# Patient Record
Sex: Male | Born: 1970 | Race: Black or African American | Hispanic: No | Marital: Single
Health system: Southern US, Academic
[De-identification: ages and names within clinical notes are randomized; demographics above are authoritative.]

## PROBLEM LIST (undated history)

## (undated) ENCOUNTER — Telehealth: Attending: Clinical | Primary: Clinical

## (undated) ENCOUNTER — Ambulatory Visit

## (undated) ENCOUNTER — Telehealth

## (undated) ENCOUNTER — Encounter

## (undated) ENCOUNTER — Ambulatory Visit
Attending: Student in an Organized Health Care Education/Training Program | Primary: Student in an Organized Health Care Education/Training Program

## (undated) ENCOUNTER — Ambulatory Visit: Payer: MEDICAID | Attending: Cardiovascular Disease | Primary: Cardiovascular Disease

## (undated) ENCOUNTER — Ambulatory Visit: Payer: MEDICAID

## (undated) ENCOUNTER — Encounter: Attending: Clinical | Primary: Clinical

## (undated) ENCOUNTER — Ambulatory Visit: Payer: MEDICAID | Attending: Foot & Ankle Surgery | Primary: Foot & Ankle Surgery

## (undated) ENCOUNTER — Telehealth: Attending: MS" | Primary: MS"

## (undated) ENCOUNTER — Ambulatory Visit: Attending: Mental Health | Primary: Mental Health

## (undated) ENCOUNTER — Telehealth: Attending: Internal Medicine | Primary: Internal Medicine

## (undated) ENCOUNTER — Telehealth
Attending: Student in an Organized Health Care Education/Training Program | Primary: Student in an Organized Health Care Education/Training Program

## (undated) ENCOUNTER — Telehealth: Attending: Oncology | Primary: Oncology

## (undated) ENCOUNTER — Ambulatory Visit: Attending: Family | Primary: Family

## (undated) ENCOUNTER — Telehealth: Attending: Children | Primary: Children

## (undated) ENCOUNTER — Encounter
Attending: Pharmacist Clinician (PhC)/ Clinical Pharmacy Specialist | Primary: Pharmacist Clinician (PhC)/ Clinical Pharmacy Specialist

## (undated) ENCOUNTER — Telehealth: Payer: MEDICAID | Attending: Children | Primary: Children

## (undated) ENCOUNTER — Ambulatory Visit: Attending: Cardiovascular Disease | Primary: Cardiovascular Disease

## (undated) ENCOUNTER — Encounter: Attending: Oncology | Primary: Oncology

## (undated) ENCOUNTER — Inpatient Hospital Stay

## (undated) ENCOUNTER — Encounter: Attending: General Acute Care Hospital | Primary: General Acute Care Hospital

## (undated) ENCOUNTER — Ambulatory Visit
Payer: MEDICAID | Attending: Student in an Organized Health Care Education/Training Program | Primary: Student in an Organized Health Care Education/Training Program

## (undated) ENCOUNTER — Ambulatory Visit: Attending: Internal Medicine | Primary: Internal Medicine

## (undated) DIAGNOSIS — I10 Essential (primary) hypertension: Secondary | ICD-10-CM

## (undated) DIAGNOSIS — I509 Heart failure, unspecified: Secondary | ICD-10-CM

## (undated) DIAGNOSIS — E119 Type 2 diabetes mellitus without complications: Secondary | ICD-10-CM

## (undated) HISTORY — PX: OTHER SURGICAL HISTORY: SHX169

## (undated) HISTORY — PX: STENT PLACEMENT VASCULAR (ARMC HX): HXRAD1737

---

## 2014-09-21 ENCOUNTER — Emergency Department: Payer: Self-pay | Admitting: Emergency Medicine

## 2014-09-21 LAB — CBC
HCT: 47.9 % (ref 40.0–52.0)
HGB: 15.6 g/dL (ref 13.0–18.0)
MCH: 29.3 pg (ref 26.0–34.0)
MCHC: 32.5 g/dL (ref 32.0–36.0)
MCV: 90 fL (ref 80–100)
PLATELETS: 281 10*3/uL (ref 150–440)
RBC: 5.31 10*6/uL (ref 4.40–5.90)
RDW: 12.6 % (ref 11.5–14.5)
WBC: 7.9 10*3/uL (ref 3.8–10.6)

## 2014-09-21 LAB — COMPREHENSIVE METABOLIC PANEL
ALBUMIN: 3.8 g/dL (ref 3.4–5.0)
ALT: 18 U/L
Alkaline Phosphatase: 112 U/L
Anion Gap: 8 (ref 7–16)
BUN: 5 mg/dL — ABNORMAL LOW (ref 7–18)
Bilirubin,Total: 0.3 mg/dL (ref 0.2–1.0)
Calcium, Total: 9.2 mg/dL (ref 8.5–10.1)
Chloride: 103 mmol/L (ref 98–107)
Co2: 27 mmol/L (ref 21–32)
Creatinine: 0.96 mg/dL (ref 0.60–1.30)
EGFR (Non-African Amer.): >60
GLUCOSE: 260 mg/dL — AB (ref 65–99)
OSMOLALITY: 282 (ref 275–301)
Potassium: 4.1 mmol/L (ref 3.5–5.1)
SGOT(AST): 16 U/L (ref 15–37)
SODIUM: 138 mmol/L (ref 136–145)
Total Protein: 8 g/dL (ref 6.4–8.2)

## 2014-09-21 LAB — LIPASE, BLOOD: Lipase: 62 U/L — ABNORMAL LOW (ref 73–393)

## 2016-06-02 ENCOUNTER — Emergency Department: Payer: Self-pay

## 2016-06-02 ENCOUNTER — Encounter: Payer: Self-pay | Admitting: Occupational Medicine

## 2016-06-02 ENCOUNTER — Inpatient Hospital Stay
Admission: EM | Admit: 2016-06-02 | Discharge: 2016-06-02 | DRG: 639 | Discharge status: Against Medical Advice | Payer: Self-pay | Attending: Internal Medicine | Admitting: Internal Medicine

## 2016-06-02 ENCOUNTER — Other Ambulatory Visit: Payer: Self-pay

## 2016-06-02 DIAGNOSIS — T68XXXA Hypothermia, initial encounter: Secondary | ICD-10-CM

## 2016-06-02 DIAGNOSIS — E162 Hypoglycemia, unspecified: Secondary | ICD-10-CM | POA: Diagnosis present

## 2016-06-02 DIAGNOSIS — R451 Restlessness and agitation: Secondary | ICD-10-CM | POA: Diagnosis present

## 2016-06-02 DIAGNOSIS — R4189 Other symptoms and signs involving cognitive functions and awareness: Secondary | ICD-10-CM

## 2016-06-02 DIAGNOSIS — G40409 Other generalized epilepsy and epileptic syndromes, not intractable, without status epilepticus: Secondary | ICD-10-CM | POA: Diagnosis present

## 2016-06-02 DIAGNOSIS — E11649 Type 2 diabetes mellitus with hypoglycemia without coma: Principal | ICD-10-CM | POA: Diagnosis present

## 2016-06-02 DIAGNOSIS — F17200 Nicotine dependence, unspecified, uncomplicated: Secondary | ICD-10-CM | POA: Diagnosis present

## 2016-06-02 HISTORY — DX: Type 2 diabetes mellitus without complications: E11.9

## 2016-06-02 LAB — CBC WITH DIFFERENTIAL/PLATELET
BASOS ABS: 0 10*3/uL (ref 0–0.1)
Basophils Relative: 0 %
EOS PCT: 2 %
Eosinophils Absolute: 0.2 10*3/uL (ref 0–0.7)
HEMATOCRIT: 43.7 % (ref 40.0–52.0)
HEMOGLOBIN: 14.9 g/dL (ref 13.0–18.0)
LYMPHS ABS: 2.5 10*3/uL (ref 1.0–3.6)
LYMPHS PCT: 22 %
MCH: 31.6 pg (ref 26.0–34.0)
MCHC: 34.1 g/dL (ref 32.0–36.0)
MCV: 92.5 fL (ref 80.0–100.0)
MONO ABS: 1.1 10*3/uL — AB (ref 0.2–1.0)
MONOS PCT: 10 %
Neutro Abs: 7.5 10*3/uL — ABNORMAL HIGH (ref 1.4–6.5)
Neutrophils Relative %: 66 %
Platelets: 284 10*3/uL (ref 150–440)
RBC: 4.72 MIL/uL (ref 4.40–5.90)
RDW: 15.4 % — ABNORMAL HIGH (ref 11.5–14.5)
WBC: 11.4 10*3/uL — ABNORMAL HIGH (ref 3.8–10.6)

## 2016-06-02 LAB — COMPREHENSIVE METABOLIC PANEL
ALBUMIN: 3.9 g/dL (ref 3.5–5.0)
ALK PHOS: 82 U/L (ref 38–126)
ALT: 9 U/L — AB (ref 17–63)
ANION GAP: 8 (ref 5–15)
AST: 23 U/L (ref 15–41)
BILIRUBIN TOTAL: 1.1 mg/dL (ref 0.3–1.2)
BUN: 12 mg/dL (ref 6–20)
CALCIUM: 8.4 mg/dL — AB (ref 8.9–10.3)
CO2: 25 mmol/L (ref 22–32)
Chloride: 110 mmol/L (ref 101–111)
Creatinine, Ser: 1.06 mg/dL (ref 0.61–1.24)
GFR calc Af Amer: >60 mL/min (ref >60)
GFR calc non Af Amer: >60 mL/min (ref >60)
GLUCOSE: 39 mg/dL — AB (ref 65–99)
Potassium: 3.4 mmol/L — ABNORMAL LOW (ref 3.5–5.1)
SODIUM: 143 mmol/L (ref 135–145)
TOTAL PROTEIN: 7.2 g/dL (ref 6.5–8.1)

## 2016-06-02 LAB — GLUCOSE, CAPILLARY
GLUCOSE-CAPILLARY: 107 mg/dL — AB (ref 65–99)
GLUCOSE-CAPILLARY: 31 mg/dL — AB (ref 65–99)
GLUCOSE-CAPILLARY: 102 mg/dL — AB (ref 65–99)
GLUCOSE-CAPILLARY: 136 mg/dL — AB (ref 65–99)
Glucose-Capillary: 31 mg/dL — CL (ref 65–99)
Glucose-Capillary: 72 mg/dL (ref 65–99)
Glucose-Capillary: 176 mg/dL — ABNORMAL HIGH (ref 65–99)

## 2016-06-02 LAB — URINE DRUG SCREEN, QUALITATIVE (ARMC ONLY)
Amphetamines, Ur Screen: NOT DETECTED
BARBITURATES, UR SCREEN: NOT DETECTED
Benzodiazepine, Ur Scrn: POSITIVE — AB
COCAINE METABOLITE, UR ~~LOC~~: POSITIVE — AB
Cannabinoid 50 Ng, Ur ~~LOC~~: POSITIVE — AB
MDMA (Ecstasy)Ur Screen: NOT DETECTED
METHADONE SCREEN, URINE: NOT DETECTED
OPIATE, UR SCREEN: NOT DETECTED
PHENCYCLIDINE (PCP) UR S: NOT DETECTED
Tricyclic, Ur Screen: NOT DETECTED

## 2016-06-02 LAB — HEMOGLOBIN A1C: Hgb A1c MFr Bld: 6.1 % — ABNORMAL HIGH (ref 4.0–6.0)

## 2016-06-02 LAB — ETHANOL: Alcohol, Ethyl (B): <5 mg/dL (ref <5)

## 2016-06-02 LAB — TSH: TSH: 0.849 u[IU]/mL (ref 0.350–4.500)

## 2016-06-02 LAB — CK: CK TOTAL: 147 U/L (ref 49–397)

## 2016-06-02 LAB — TROPONIN I: Troponin I: <0.03 ng/mL (ref <0.031)

## 2016-06-02 IMAGING — CT CT HEAD W/O CM
4 series · 16 of 47 positions shown, 18 images · non-contrast
Comparison: None.

CLINICAL DATA: Unresponsive patient, actively seizing.

EXAM:
CT HEAD WITHOUT CONTRAST
TECHNIQUE: Contiguous axial images were obtained from the base of the skull
through the vertex without intravenous contrast.

[Series 2: head wo · axial · 0.43mm/px · z∈[+728,+844]mm · 7 of 31 slices shown, 9 images]
[im 4/31  brain]
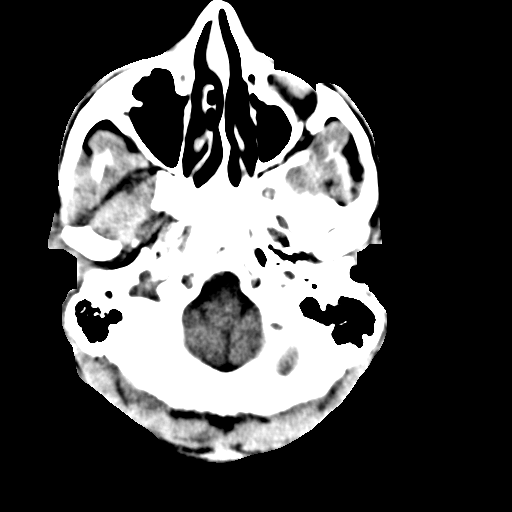
[im 4/31  bone]
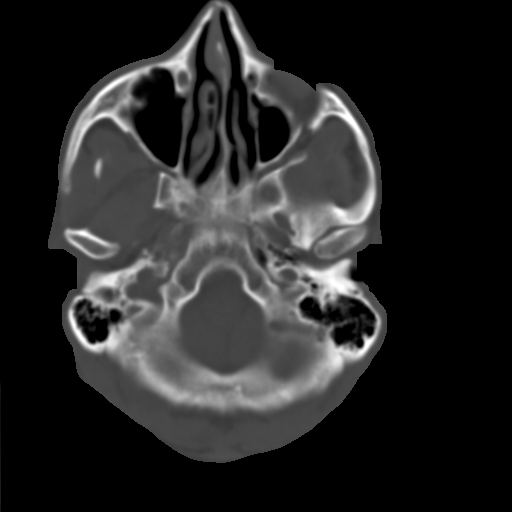
[im 8/31  brain]
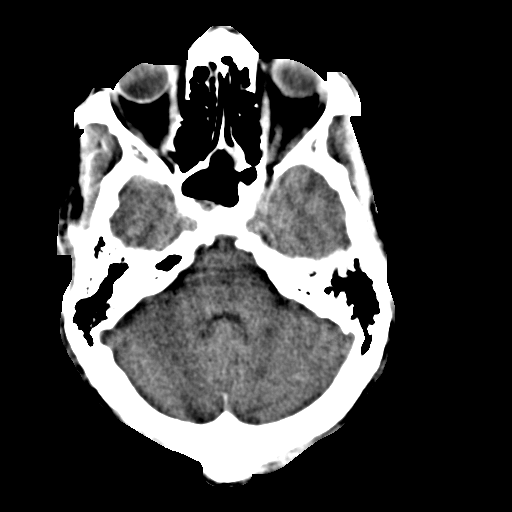
[im 12/31  brain]
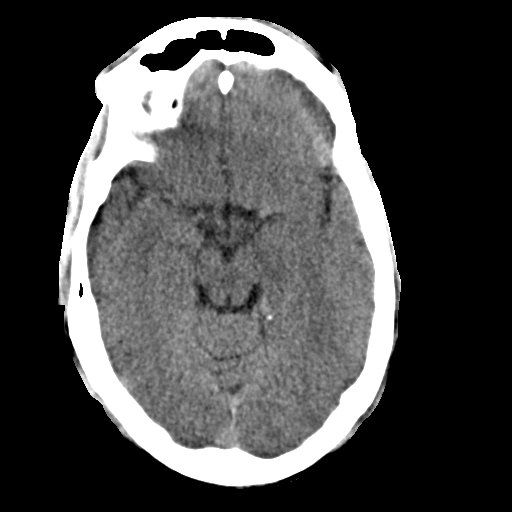
[im 16/31  brain]
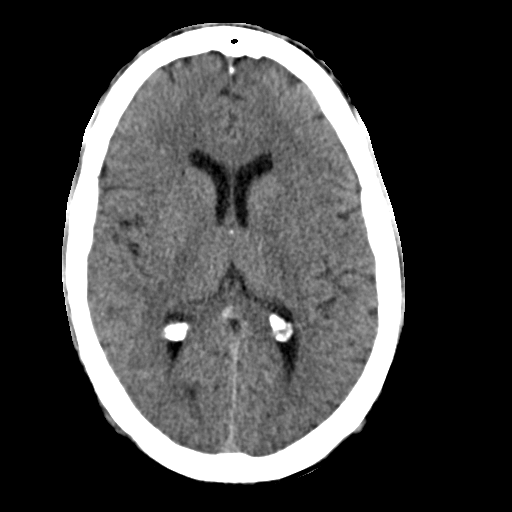
[im 19/31  brain]
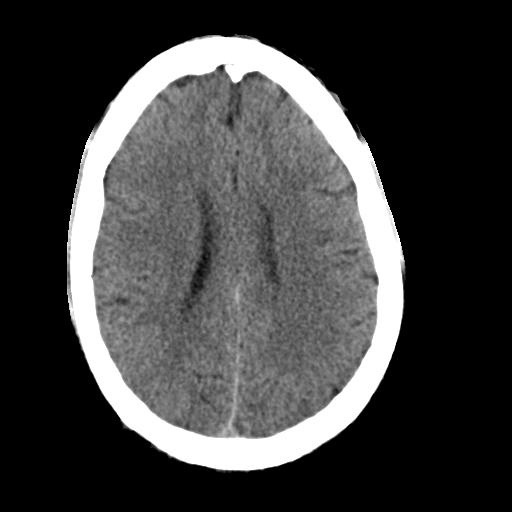
[im 19/31  bone]
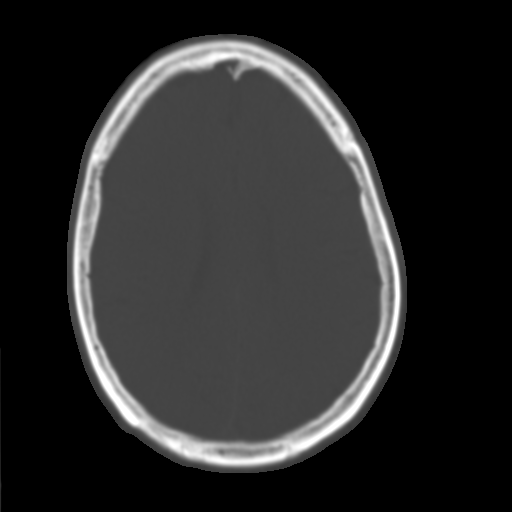
[im 23/31  brain]
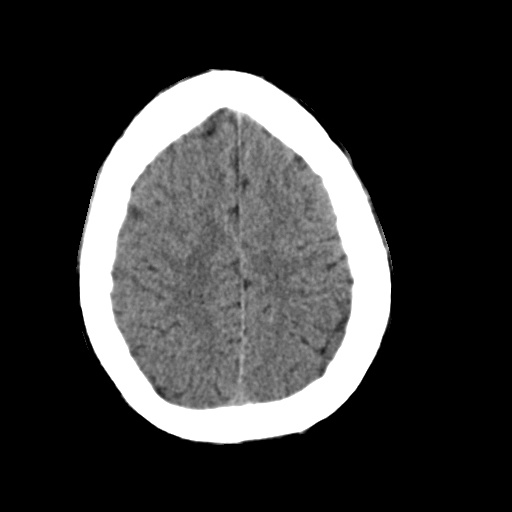
[im 27/31  brain]
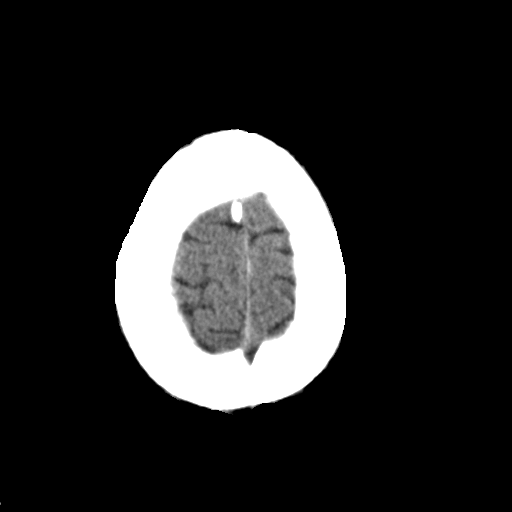

[Series 3: head bone · axial · 0.43mm/px · z∈[+728,+758]mm · 3 of 77 slices shown]
[im 8/77  bone]
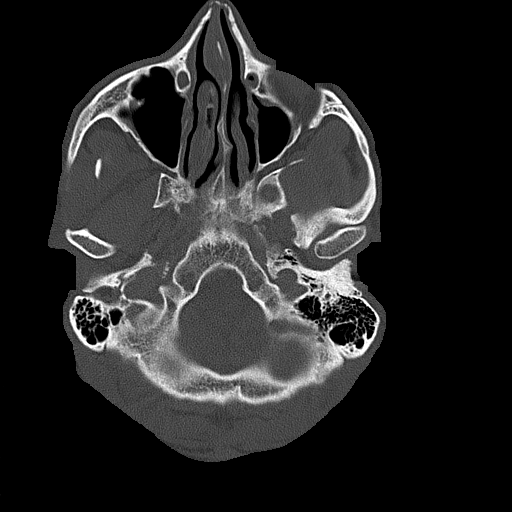
[im 16/77  bone]
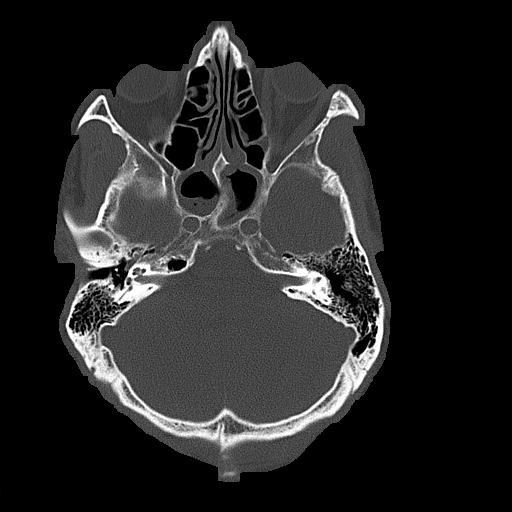
[im 23/77  bone]
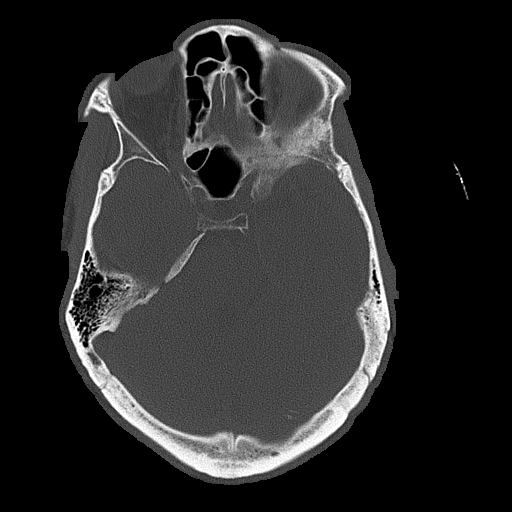

[Series 4: coronal soft tissue · coronal · 0.32mm/px · 3 of 71 slices shown]
[im 24/71  brain]
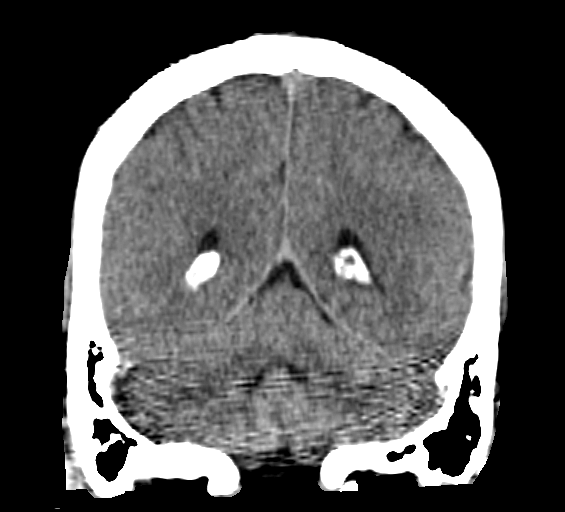
[im 32/71  brain]
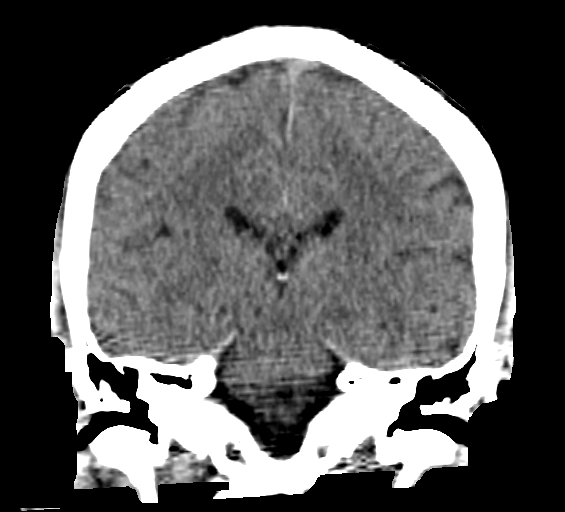
[im 39/71  brain]
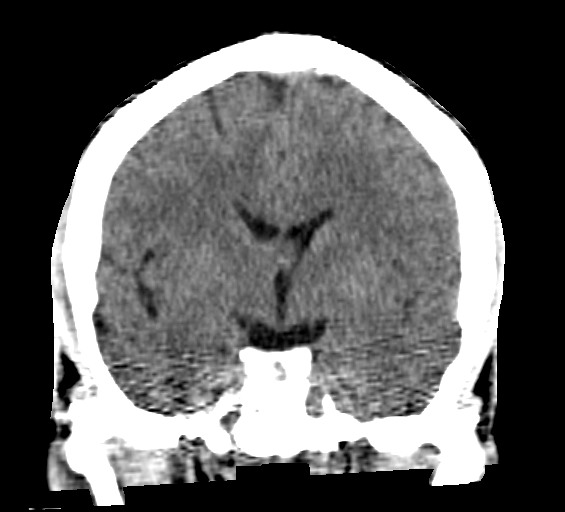

[Series 5: sagittal soft tissue · sagittal · 0.31mm/px · 3 of 52 slices shown]
[im 18/52  brain]
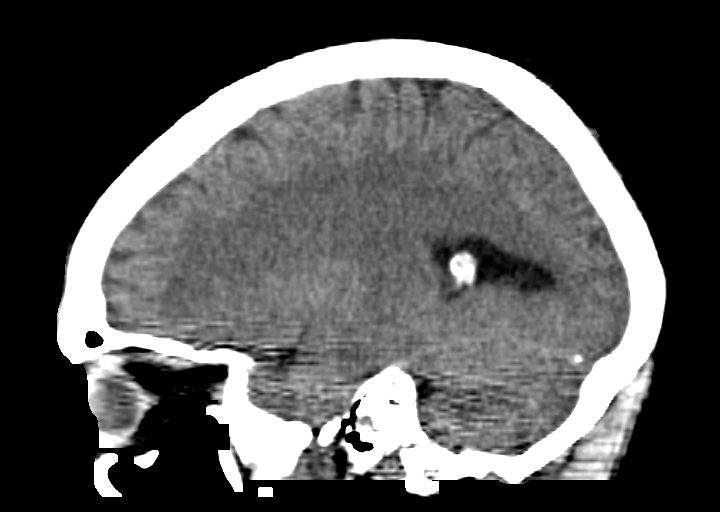
[im 26/52  brain]
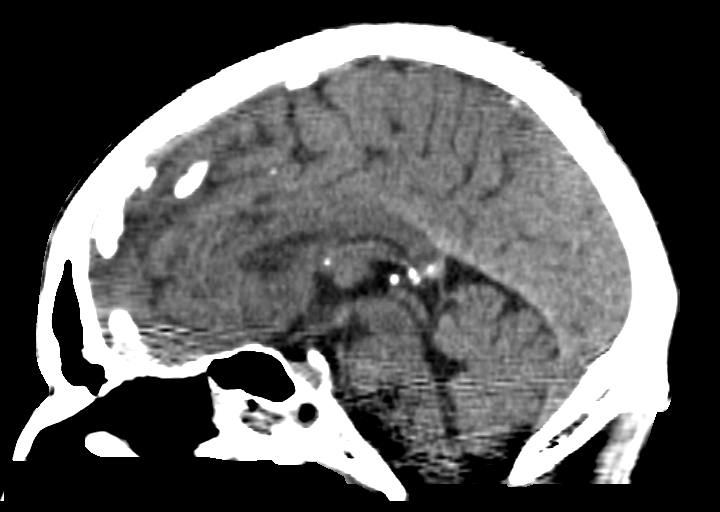
[im 35/52  brain]
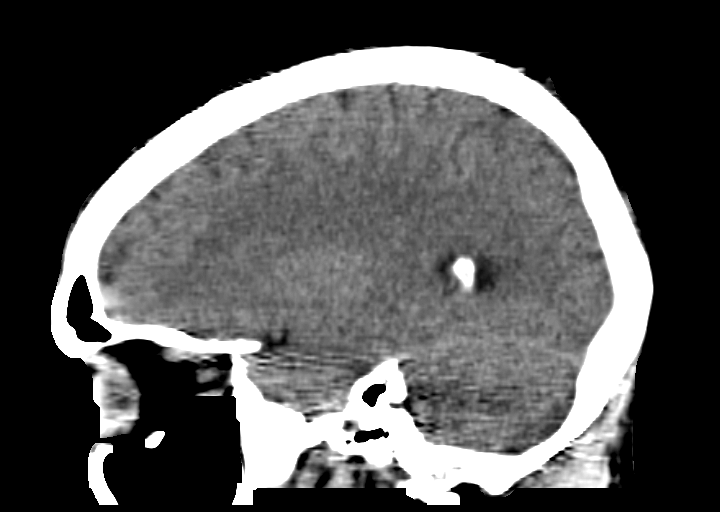

[16 of 47 positions shown; findings below may reference images not displayed]

FINDINGS: Brain: No evidence of acute infarction, hemorrhage, extra-axial
collection, ventriculomegaly, or mass effect.

Vascular: No hyperdense vessel or unexpected calcification.

Skull: Negative for fracture or focal lesion.

Sinuses/Orbits: Polypoid mucosal thickening of bilateral sphenoid
sinuses and less so bilateral ethmoid sinuses.

Other: None.
IMPRESSION: No acute intracranial abnormality.

Bilateral sphenoid and less so ethmoid sinusitis, with chronic
appearance.

## 2016-06-02 IMAGING — CR DG CHEST 1V PORT
1 series · 1 of 1 positions shown · non-contrast
Comparison: None.

CLINICAL DATA: Patient found unresponsive this morning.

EXAM:
PORTABLE CHEST 1 VIEW

[chest ap]
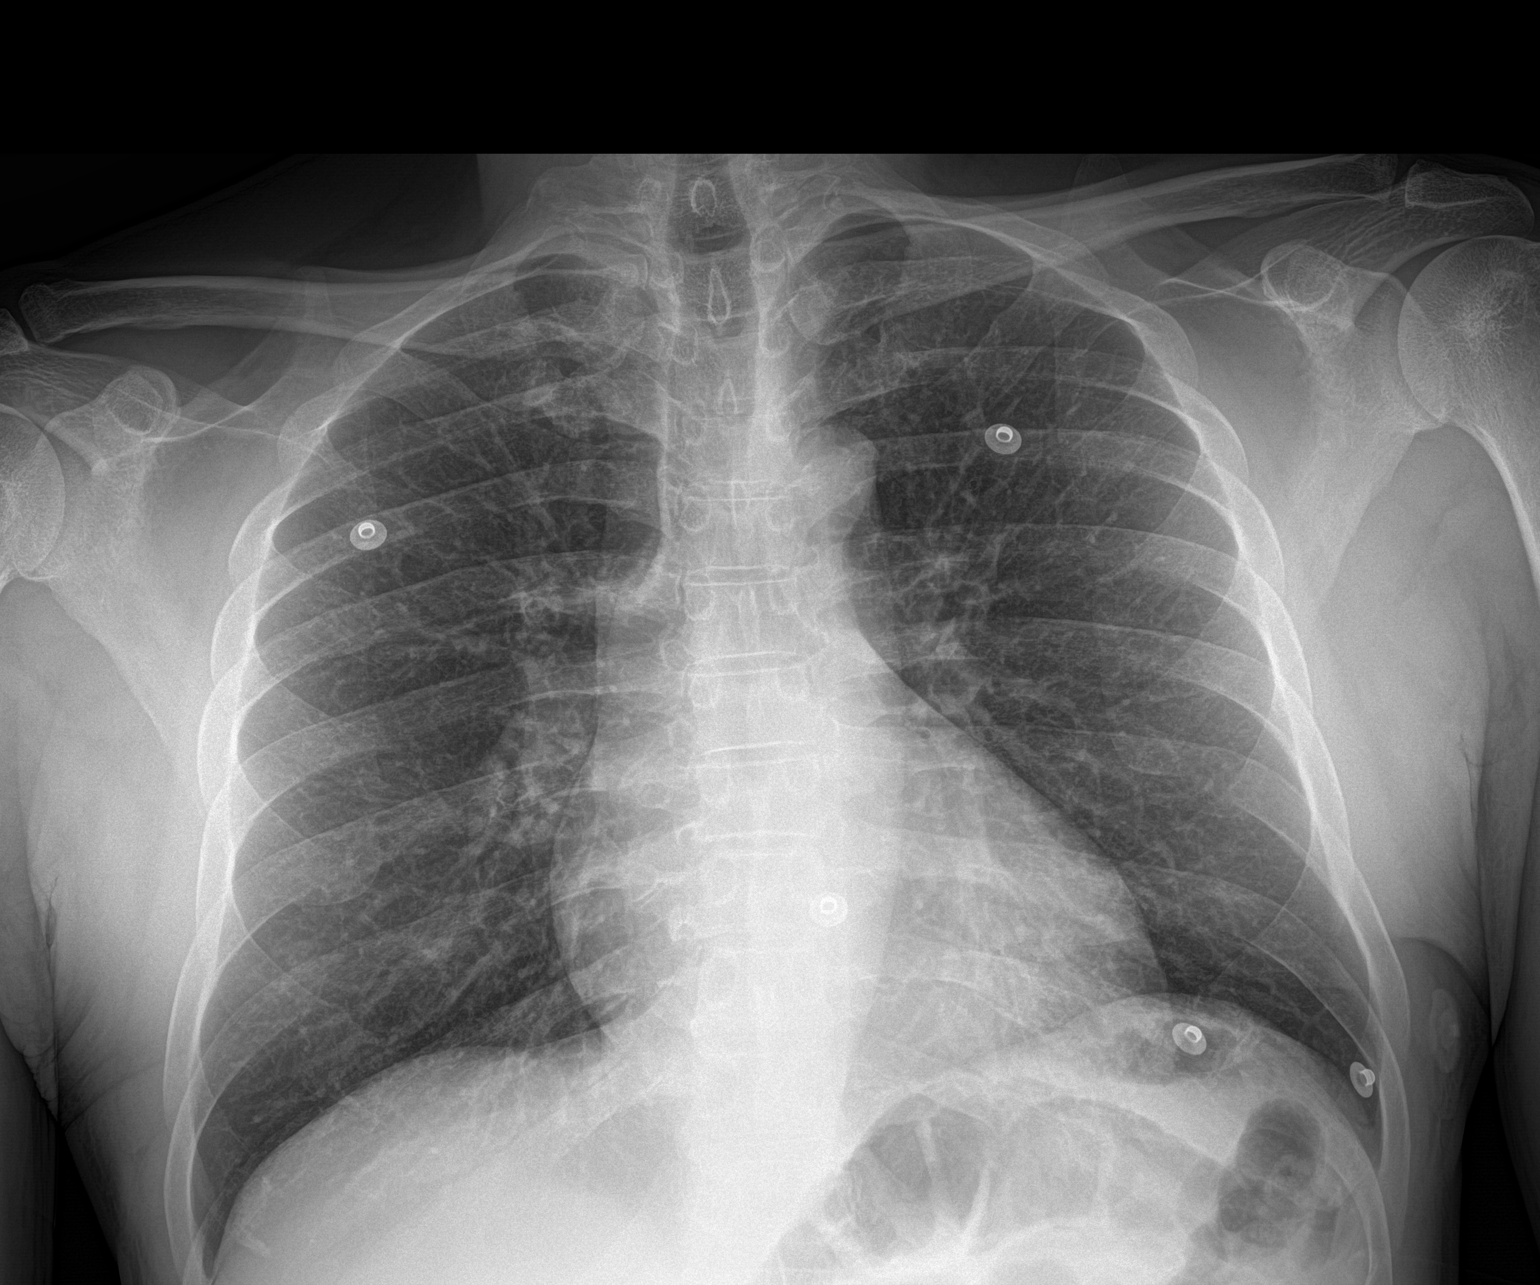

[1 of 1 positions shown; findings below may reference images not displayed]

FINDINGS: Cardiomediastinal silhouette is normal. Mediastinal contours appear
intact.

There is no evidence of focal airspace consolidation, pleural
effusion or pneumothorax.

Osseous structures are without acute abnormality. Soft tissues are
grossly normal.
IMPRESSION: No active disease.

## 2016-06-02 MED ORDER — DEXTROSE 50 % IV SOLN
1.0000 | Freq: Once | INTRAVENOUS | Status: AC
  Administered 2016-06-02: 50 mL via INTRAVENOUS
  Filled 2016-06-02: qty 50

## 2016-06-02 MED ORDER — SODIUM CHLORIDE 0.9 % IV BOLUS (SEPSIS): 1000.0000 mL | Freq: Once | INTRAVENOUS | Status: DC

## 2016-06-02 MED ORDER — ACETAMINOPHEN 650 MG RE SUPP: 650.0000 mg | Freq: Four times a day (QID) | RECTAL | Status: DC | PRN

## 2016-06-02 MED ORDER — ACETAMINOPHEN 325 MG PO TABS: 650.0000 mg | ORAL_TABLET | Freq: Four times a day (QID) | ORAL | Status: DC | PRN

## 2016-06-02 MED ORDER — ONDANSETRON HCL 4 MG PO TABS: 4.0000 mg | ORAL_TABLET | Freq: Four times a day (QID) | ORAL | Status: DC | PRN

## 2016-06-02 MED ORDER — ONDANSETRON HCL 4 MG/2ML IJ SOLN: 4.0000 mg | Freq: Four times a day (QID) | INTRAMUSCULAR | Status: DC | PRN

## 2016-06-02 MED ORDER — SODIUM CHLORIDE 4 MEQ/ML IV SOLN
INTRAVENOUS | Status: DC
  Filled 2016-06-02: qty 1000

## 2016-06-02 MED ORDER — DEXTROSE 10 % IV SOLN: INTRAVENOUS | Status: DC

## 2016-06-02 MED ORDER — ENOXAPARIN SODIUM 40 MG/0.4ML ~~LOC~~ SOLN: 40.0000 mg | SUBCUTANEOUS | Status: DC

## 2016-06-02 NOTE — Discharge Summary (Signed)
[place], 45 y.o., DOB [birth year], MRN [id number]. Admission date: 06/02/2016 Discharge Date 06/02/2016 Primary MD No PCP Per Patient Admitting Physician Dustin Flock, MD  AMA advise summary    Admission Diagnosis  Hypoglycemia [E16.2] Unresponsive episode [R40.4] Hypothermia, initial encounter [T68.XXXA]  Discharge Diagnosis   Active Problems:   Hypoglycemia  substance abuse  hypothermia   Hospital Course: Pt admited with hypoglycemia and a possible seizure. Patient became awake. His sugars are in the 176. He became very agitated and stated that he did not want to stay in the hospital. We recommended that he strongly stay and be monitored overnight. Our refused and signed [place].         Total Time in preparing paper work, data evaluation and todays exam - 35 minutes  Dustin Flock M.D on 06/02/2016 at 12:46 PM  [facility]   Office  [phone]
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
## 2016-06-02 NOTE — ED Notes (Signed)
MD made aware of BS after D50 asked if MD if she wanted D5 fluids running. MD said to wait and see how he does.

## 2016-06-02 NOTE — ED Notes (Signed)
MD awre of bs ordering AMP D50
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
## 2016-06-02 NOTE — ED Notes (Signed)
Pt started to sweat large amounts all over body.  Pt alert and orient x3 but drowsy.  Pt continues to be verbally argumentative.
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
## 2016-06-02 NOTE — ED Notes (Signed)
MD at bedside. 

## 2016-06-02 NOTE — Progress Notes (Signed)
Patient admitted to floor. Patient signed [place] paperwork. MD aware.

## 2016-06-02 NOTE — ED Provider Notes (Signed)
  Physical Exam  BP 141/111 mmHg  Pulse 73  Temp(Src) 94.6 F (34.8 C) (Oral)  Resp 14  Ht 5\' 10"  (1.778 m)  Wt 176 lb 8 oz (80.06 kg)  BMI 25.33 kg/m2  SpO2 100%  Physical Exam  ED Course  Procedures  MDM Patient care assumed at sign out. Patient uses insulin not as prescribed. Found in his car altered with white substance in his mouth. He was found to be hypoglycemic, hypothermic, given narcan and D 50 by EMS. CBG was 31 on arrival. Given 1 amp D50. Went up to 107 then dropped back down to 74. Started on D10 drip. Will admit for hypoglycemia. No signs of infection.       Wandra Arthurs, MD 06/02/16 [phone]

## 2016-06-02 NOTE — ED Notes (Addendum)
Patient presents to Emergency Department via EMS pt was unresponsive on scene with [facility] and [employer]. BP gave 6 mg of narcan pt actively having seizures when EMS arrive. EMS reported BS read low gave D50 BS came up to 146. EMS gave 2mg  versed and 2mg  of narcan.  Pt NSR. Pt on arrival Alert and [place] x4.  V/S stable. EMS gave 800 ml NS in route. Pt is agitated and verbally argumentative with BP and nursing staff.  EMS reported finding a white power substance pt was chewing when they arrived on scene once they got substance out of mouth pt started to come around.  BP officer Delena Bali at bedside. [place] [employer] [place] in his car.

## 2016-06-02 NOTE — ED Notes (Signed)
Patient transported to X-ray 

## 2017-06-07 IMAGING — CR DG ABDOMEN 1V
2 series · 2 of 2 positions shown · non-contrast
Comparison: None.

CLINICAL DATA: Patient found unresponsive this morning.

EXAM:
ABDOMEN - 1 VIEW

[abdomen kub (1 of 2)]
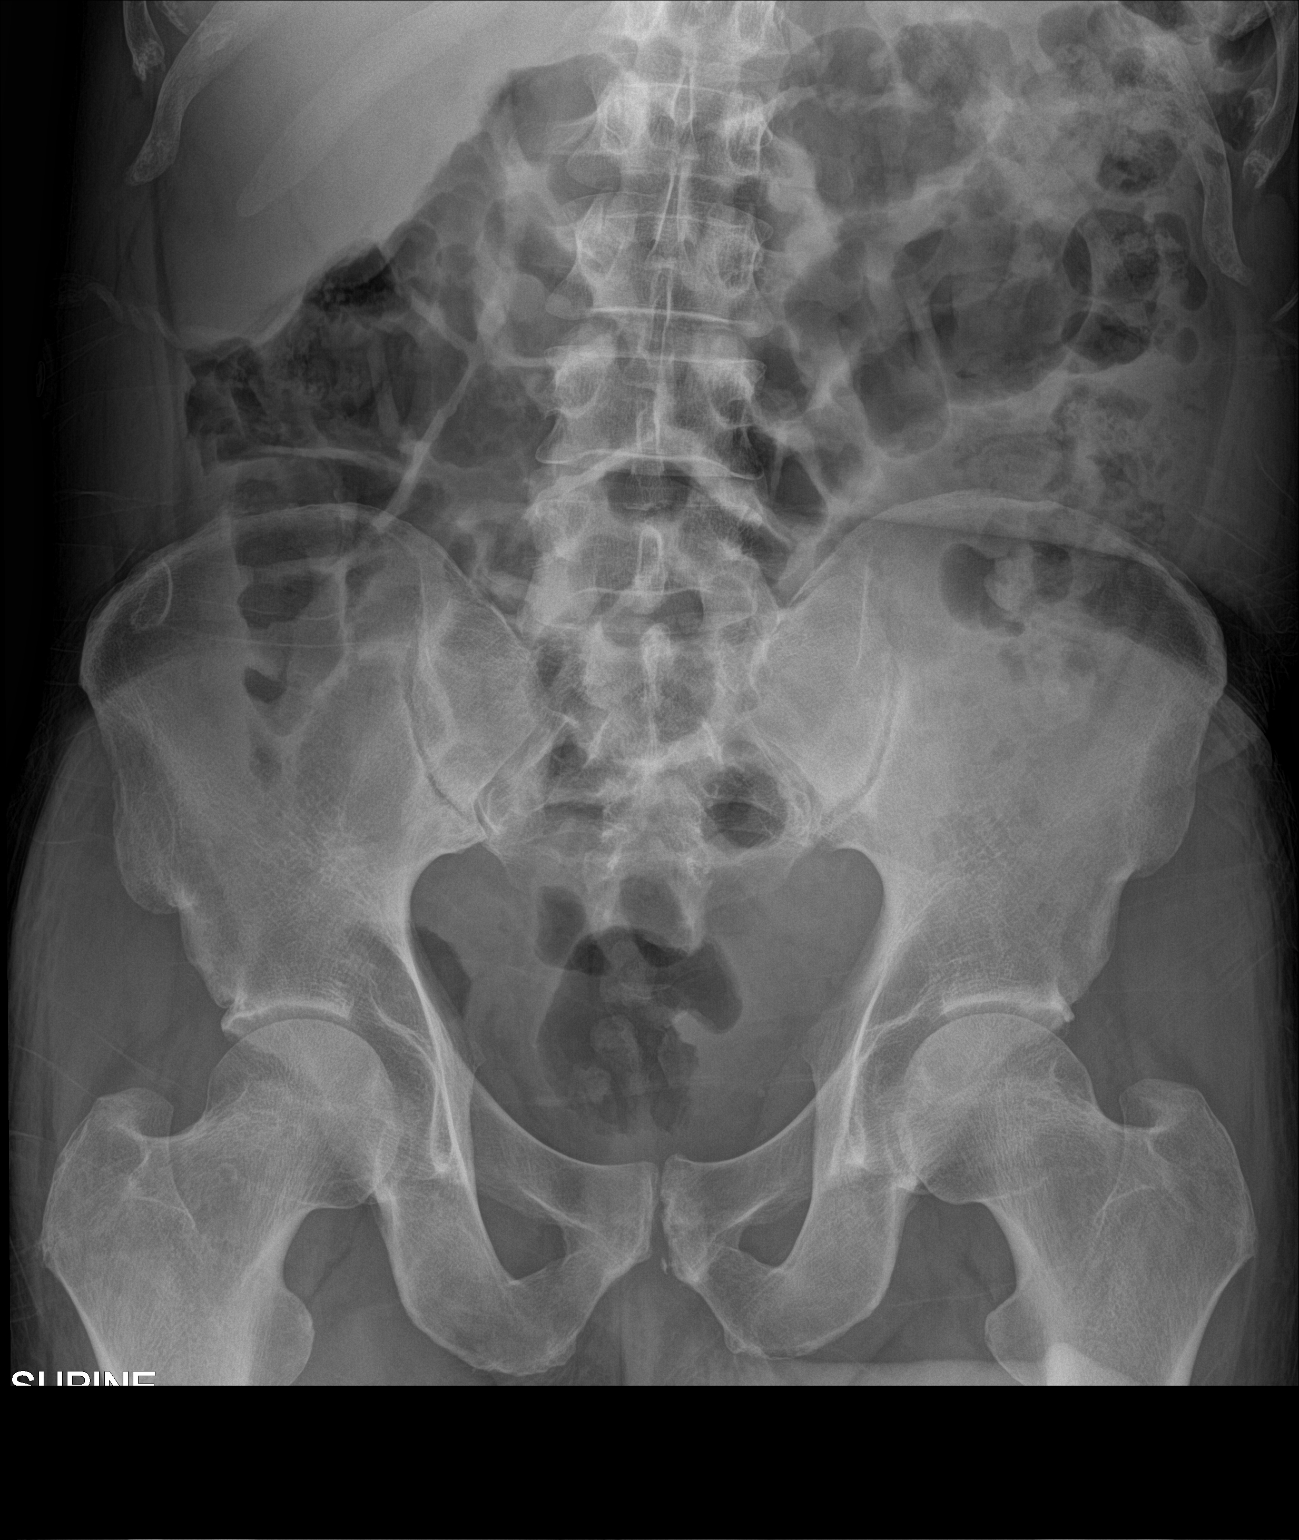

[abdomen kub (2 of 2)]
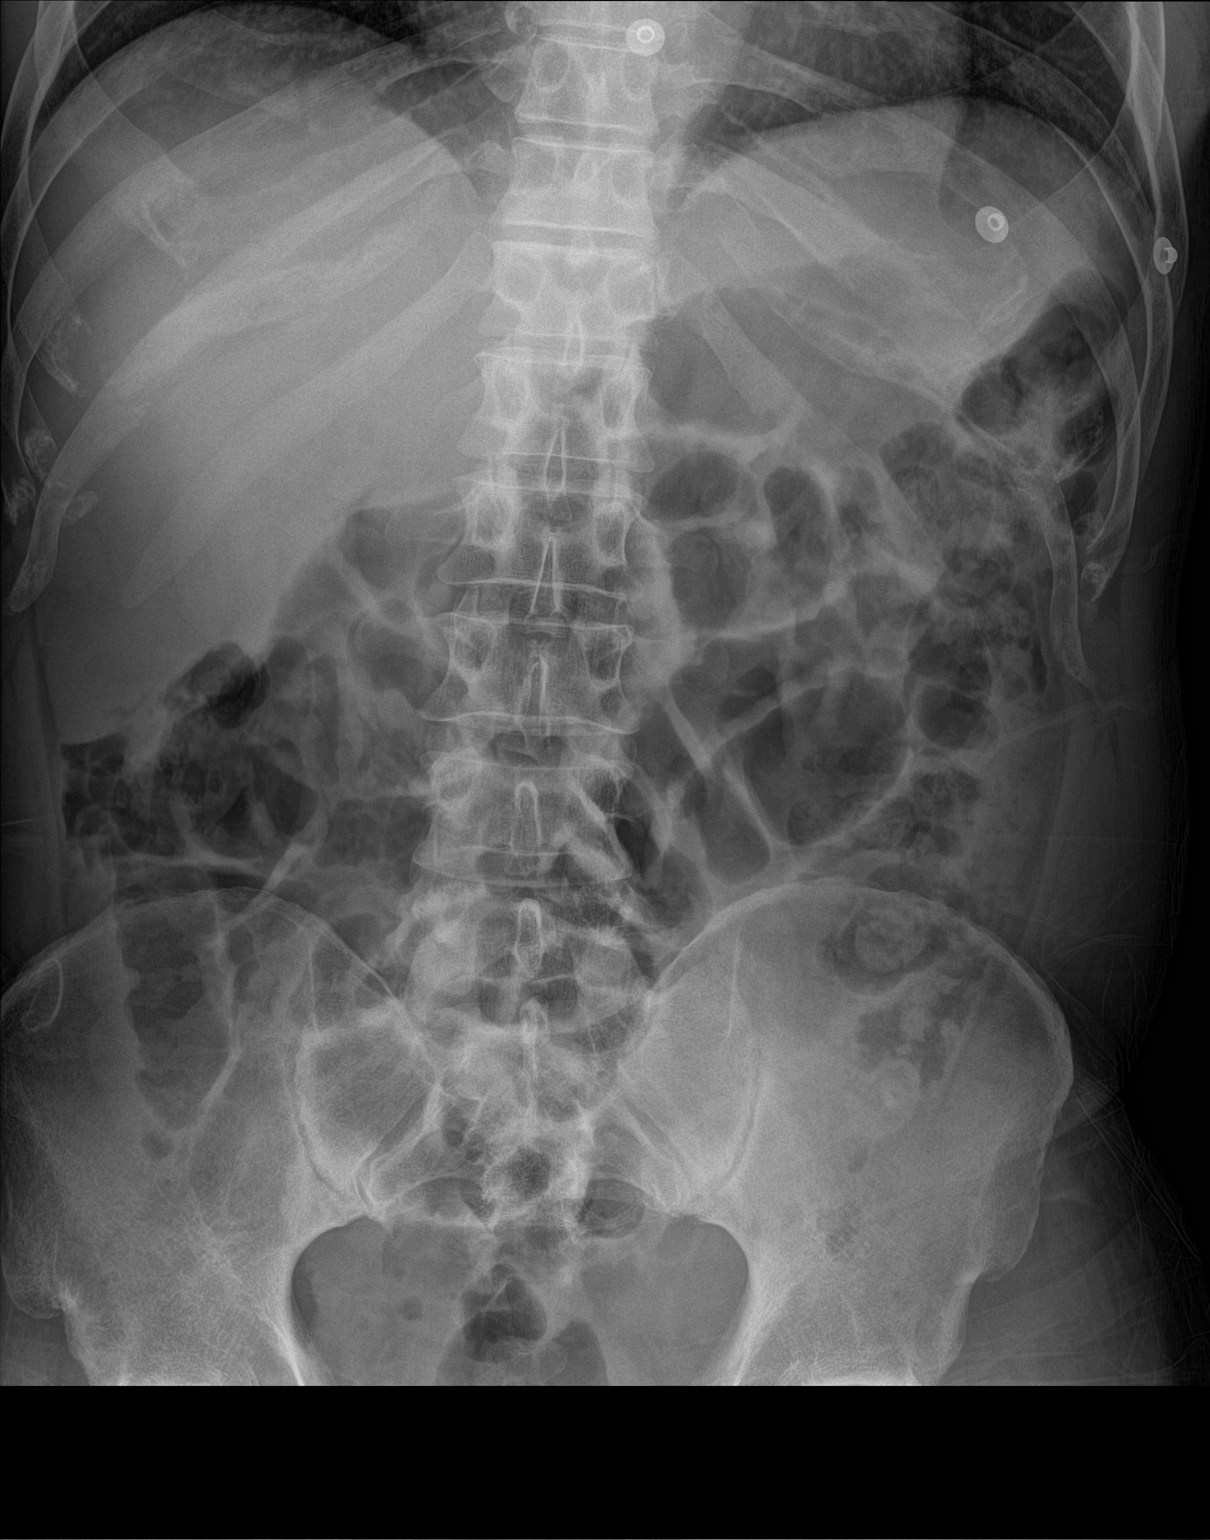

[2 of 2 positions shown; findings below may reference images not displayed]

FINDINGS: The bowel gas pattern is normal. No radio-opaque calculi or other
significant radiographic abnormality are seen.
IMPRESSION: Negative.

## 2018-09-09 ENCOUNTER — Inpatient Hospital Stay
Admission: EM | Admit: 2018-09-09 | Discharge: 2018-09-10 | DRG: 247 | Disposition: A | Discharge status: Home / Self Care | Payer: Medicaid Other | Attending: Internal Medicine | Admitting: Internal Medicine

## 2018-09-09 ENCOUNTER — Encounter: Payer: Self-pay | Admitting: Internal Medicine

## 2018-09-09 ENCOUNTER — Emergency Department: Payer: Medicaid Other

## 2018-09-09 ENCOUNTER — Encounter
Admission: EM | Disposition: A | Discharge status: Home / Self Care | Payer: Self-pay | Source: Home / Self Care | Attending: Internal Medicine

## 2018-09-09 ENCOUNTER — Other Ambulatory Visit: Payer: Self-pay

## 2018-09-09 DIAGNOSIS — E1165 Type 2 diabetes mellitus with hyperglycemia: Secondary | ICD-10-CM | POA: Diagnosis present

## 2018-09-09 DIAGNOSIS — I429 Cardiomyopathy, unspecified: Secondary | ICD-10-CM | POA: Diagnosis present

## 2018-09-09 DIAGNOSIS — F1410 Cocaine abuse, uncomplicated: Secondary | ICD-10-CM | POA: Diagnosis present

## 2018-09-09 DIAGNOSIS — I2102 ST elevation (STEMI) myocardial infarction involving left anterior descending coronary artery: Secondary | ICD-10-CM | POA: Diagnosis present

## 2018-09-09 DIAGNOSIS — Z79899 Other long term (current) drug therapy: Secondary | ICD-10-CM

## 2018-09-09 DIAGNOSIS — I2109 ST elevation (STEMI) myocardial infarction involving other coronary artery of anterior wall: Secondary | ICD-10-CM | POA: Diagnosis present

## 2018-09-09 DIAGNOSIS — F17200 Nicotine dependence, unspecified, uncomplicated: Secondary | ICD-10-CM | POA: Diagnosis present

## 2018-09-09 DIAGNOSIS — F1910 Other psychoactive substance abuse, uncomplicated: Secondary | ICD-10-CM

## 2018-09-09 DIAGNOSIS — I10 Essential (primary) hypertension: Secondary | ICD-10-CM | POA: Diagnosis present

## 2018-09-09 HISTORY — PX: LEFT HEART CATH AND CORONARY ANGIOGRAPHY: CATH118249

## 2018-09-09 HISTORY — PX: CORONARY/GRAFT ACUTE MI REVASCULARIZATION: CATH118305

## 2018-09-09 LAB — BASIC METABOLIC PANEL
Anion gap: 8 (ref 5–15)
BUN: 9 mg/dL (ref 6–20)
CALCIUM: 8.7 mg/dL — AB (ref 8.9–10.3)
CO2: 26 mmol/L (ref 22–32)
CREATININE: 1.09 mg/dL (ref 0.61–1.24)
Chloride: 103 mmol/L (ref 98–111)
GFR calc Af Amer: >60 mL/min (ref >60)
GFR calc non Af Amer: >60 mL/min (ref >60)
Glucose, Bld: 236 mg/dL — ABNORMAL HIGH (ref 70–99)
Potassium: 4 mmol/L (ref 3.5–5.1)
Sodium: 137 mmol/L (ref 135–145)

## 2018-09-09 LAB — CBC
HCT: 42.9 % (ref 40.0–52.0)
Hemoglobin: 14.4 g/dL (ref 13.0–18.0)
MCH: 29.9 pg (ref 26.0–34.0)
MCHC: 33.6 g/dL (ref 32.0–36.0)
MCV: 88.9 fL (ref 80.0–100.0)
PLATELETS: 263 10*3/uL (ref 150–440)
RBC: 4.83 MIL/uL (ref 4.40–5.90)
RDW: 14.2 % (ref 11.5–14.5)
WBC: 11.8 10*3/uL — ABNORMAL HIGH (ref 3.8–10.6)

## 2018-09-09 LAB — URINE DRUG SCREEN, QUALITATIVE (ARMC ONLY)
AMPHETAMINES, UR SCREEN: NOT DETECTED
BARBITURATES, UR SCREEN: NOT DETECTED
Benzodiazepine, Ur Scrn: NOT DETECTED
COCAINE METABOLITE, UR ~~LOC~~: POSITIVE — AB
Cannabinoid 50 Ng, Ur ~~LOC~~: POSITIVE — AB
MDMA (Ecstasy)Ur Screen: NOT DETECTED
METHADONE SCREEN, URINE: NOT DETECTED
Opiate, Ur Screen: POSITIVE — AB
Phencyclidine (PCP) Ur S: NOT DETECTED
TRICYCLIC, UR SCREEN: NOT DETECTED

## 2018-09-09 LAB — GLUCOSE, CAPILLARY
GLUCOSE-CAPILLARY: 307 mg/dL — AB (ref 70–99)
GLUCOSE-CAPILLARY: 58 mg/dL — AB (ref 70–99)
GLUCOSE-CAPILLARY: 142 mg/dL — AB (ref 70–99)
Glucose-Capillary: 192 mg/dL — ABNORMAL HIGH (ref 70–99)
Glucose-Capillary: 163 mg/dL — ABNORMAL HIGH (ref 70–99)
Glucose-Capillary: 204 mg/dL — ABNORMAL HIGH (ref 70–99)

## 2018-09-09 LAB — HEMOGLOBIN A1C
HEMOGLOBIN A1C: 8 % — AB (ref 4.8–5.6)
MEAN PLASMA GLUCOSE: 182.9 mg/dL

## 2018-09-09 LAB — TROPONIN I
TROPONIN I: 9.16 ng/mL — AB (ref <0.03)
Troponin I: >65 ng/mL (ref <0.03)

## 2018-09-09 LAB — CARDIAC CATHETERIZATION: Cath EF Quantitative: 30 %

## 2018-09-09 LAB — POCT ACTIVATED CLOTTING TIME: ACTIVATED CLOTTING TIME: 384 s

## 2018-09-09 IMAGING — DX DG CHEST 1V PORT
1 series · 1 of 1 positions shown · non-contrast
Comparison: Chest radiograph dated [DATE]

CLINICAL DATA: 46-year-old male with chest pain.

EXAM:
PORTABLE CHEST 1 VIEW

[chest ap]
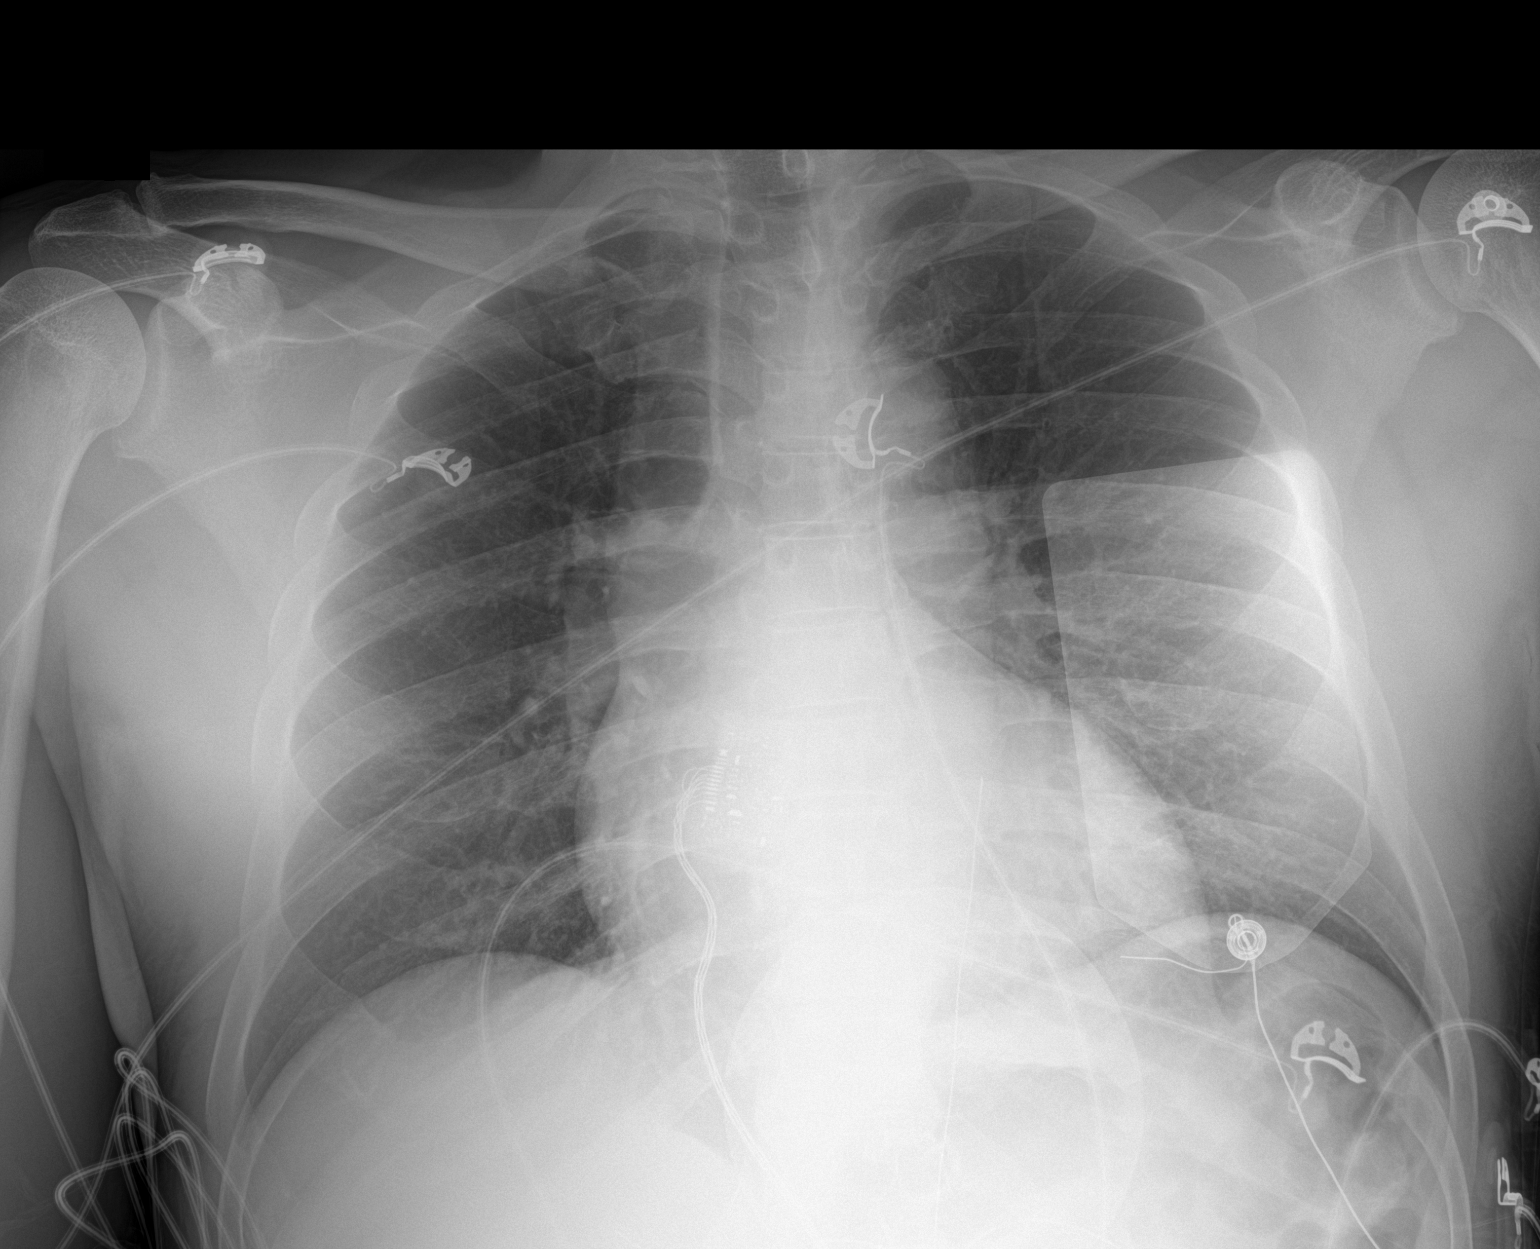

[1 of 1 positions shown; findings below may reference images not displayed]

FINDINGS: No focal consolidation, pleural effusion, or pneumothorax. The
cardiac silhouette is within normal limits. No acute osseous
pathology.
IMPRESSION: No active disease.

## 2018-09-09 SURGERY — CORONARY/GRAFT ACUTE MI REVASCULARIZATION
Anesthesia: Moderate Sedation

## 2018-09-09 MED ORDER — ASPIRIN EC 81 MG PO TBEC
81.0000 mg | DELAYED_RELEASE_TABLET | Freq: Every day | ORAL | Status: DC
  Administered 2018-09-10: 81 mg via ORAL
  Filled 2018-09-09: qty 1

## 2018-09-09 MED ORDER — CLOPIDOGREL BISULFATE 75 MG PO TABS
75.0000 mg | ORAL_TABLET | Freq: Every day | ORAL | Status: DC
  Administered 2018-09-09 – 2018-09-10 (×2): 75 mg via ORAL
  Filled 2018-09-09 (×2): qty 1

## 2018-09-09 MED ORDER — SODIUM CHLORIDE 0.9% FLUSH: 3.0000 mL | INTRAVENOUS | Status: DC | PRN

## 2018-09-09 MED ORDER — CARVEDILOL 3.125 MG PO TABS
3.1250 mg | ORAL_TABLET | Freq: Two times a day (BID) | ORAL | Status: DC
  Administered 2018-09-09: 3.125 mg via ORAL
  Filled 2018-09-09: qty 1

## 2018-09-09 MED ORDER — SODIUM CHLORIDE 0.9 % IV SOLN
INTRAVENOUS | Status: AC | PRN
  Administered 2018-09-09: 1.75 mg/kg/h via INTRAVENOUS

## 2018-09-09 MED ORDER — MORPHINE SULFATE (PF) 2 MG/ML IV SOLN
2.0000 mg | Freq: Once | INTRAVENOUS | Status: AC
  Administered 2018-09-09: 2 mg via INTRAVENOUS

## 2018-09-09 MED ORDER — METOPROLOL SUCCINATE ER 50 MG PO TB24
25.0000 mg | ORAL_TABLET | Freq: Every day | ORAL | Status: DC
  Administered 2018-09-09 – 2018-09-10 (×2): 25 mg via ORAL
  Filled 2018-09-09 (×2): qty 1

## 2018-09-09 MED ORDER — CLOPIDOGREL BISULFATE 75 MG PO TABS
600.0000 mg | ORAL_TABLET | Freq: Once | ORAL | Status: AC
  Administered 2018-09-09: 600 mg via ORAL

## 2018-09-09 MED ORDER — NITROGLYCERIN 0.4 MG SL SUBL: 0.4000 mg | SUBLINGUAL_TABLET | SUBLINGUAL | Status: DC | PRN

## 2018-09-09 MED ORDER — SODIUM CHLORIDE 0.9% FLUSH
3.0000 mL | Freq: Two times a day (BID) | INTRAVENOUS | Status: DC
  Administered 2018-09-09 – 2018-09-10 (×2): 3 mL via INTRAVENOUS

## 2018-09-09 MED ORDER — ONDANSETRON HCL 4 MG/2ML IJ SOLN: 4.0000 mg | Freq: Four times a day (QID) | INTRAMUSCULAR | Status: DC | PRN

## 2018-09-09 MED ORDER — LISINOPRIL 5 MG PO TABS: 2.5000 mg | ORAL_TABLET | Freq: Every day | ORAL | Status: DC

## 2018-09-09 MED ORDER — BIVALIRUDIN TRIFLUOROACETATE 250 MG IV SOLR
INTRAVENOUS | Status: AC
  Filled 2018-09-09: qty 250

## 2018-09-09 MED ORDER — LISINOPRIL 5 MG PO TABS
2.5000 mg | ORAL_TABLET | Freq: Two times a day (BID) | ORAL | Status: DC
  Filled 2018-09-09: qty 1

## 2018-09-09 MED ORDER — CLOPIDOGREL BISULFATE 75 MG PO TABS: 75.0000 mg | ORAL_TABLET | Freq: Every day | ORAL | Status: DC

## 2018-09-09 MED ORDER — MORPHINE SULFATE (PF) 4 MG/ML IV SOLN: 2.0000 mg | INTRAVENOUS | Status: DC | PRN

## 2018-09-09 MED ORDER — IOPAMIDOL (ISOVUE-300) INJECTION 61%
INTRAVENOUS | Status: DC | PRN
  Administered 2018-09-09: 290 mL

## 2018-09-09 MED ORDER — ATORVASTATIN CALCIUM 20 MG PO TABS: 40.0000 mg | ORAL_TABLET | Freq: Every day | ORAL | Status: DC

## 2018-09-09 MED ORDER — INSULIN ASPART 100 UNIT/ML ~~LOC~~ SOLN: 0.0000 [IU] | Freq: Every day | SUBCUTANEOUS | Status: DC

## 2018-09-09 MED ORDER — HYDRALAZINE HCL 20 MG/ML IJ SOLN: 5.0000 mg | INTRAMUSCULAR | Status: AC | PRN

## 2018-09-09 MED ORDER — NICOTINE 21 MG/24HR TD PT24
21.0000 mg | MEDICATED_PATCH | Freq: Every day | TRANSDERMAL | Status: DC
  Filled 2018-09-09 (×2): qty 1

## 2018-09-09 MED ORDER — ACETAMINOPHEN 325 MG PO TABS: 650.0000 mg | ORAL_TABLET | ORAL | Status: DC | PRN

## 2018-09-09 MED ORDER — LISINOPRIL 5 MG PO TABS
5.0000 mg | ORAL_TABLET | Freq: Two times a day (BID) | ORAL | Status: DC
  Administered 2018-09-09 (×2): 5 mg via ORAL
  Filled 2018-09-09 (×3): qty 1

## 2018-09-09 MED ORDER — NITROGLYCERIN 1 MG/10 ML FOR IR/CATH LAB
INTRA_ARTERIAL | Status: DC | PRN
  Administered 2018-09-09 (×2): 200 ug via INTRACORONARY

## 2018-09-09 MED ORDER — SENNOSIDES-DOCUSATE SODIUM 8.6-50 MG PO TABS: 1.0000 | ORAL_TABLET | Freq: Every evening | ORAL | Status: DC | PRN

## 2018-09-09 MED ORDER — BIVALIRUDIN BOLUS VIA INFUSION - CUPID
INTRAVENOUS | Status: DC | PRN
  Administered 2018-09-09: 60.075 mg via INTRAVENOUS

## 2018-09-09 MED ORDER — HEPARIN (PORCINE) IN NACL 1000-0.9 UT/500ML-% IV SOLN
INTRAVENOUS | Status: DC | PRN
  Administered 2018-09-09: 1000 mL

## 2018-09-09 MED ORDER — NITROGLYCERIN 5 MG/ML IV SOLN
INTRAVENOUS | Status: AC
  Filled 2018-09-09: qty 10

## 2018-09-09 MED ORDER — MORPHINE SULFATE (PF) 2 MG/ML IV SOLN
INTRAVENOUS | Status: AC
  Administered 2018-09-09: 2 mg via INTRAVENOUS
  Filled 2018-09-09: qty 1

## 2018-09-09 MED ORDER — ATORVASTATIN CALCIUM 80 MG PO TABS
80.0000 mg | ORAL_TABLET | Freq: Every day | ORAL | Status: DC
  Administered 2018-09-09: 80 mg via ORAL
  Filled 2018-09-09: qty 4
  Filled 2018-09-09 (×2): qty 1

## 2018-09-09 MED ORDER — SODIUM CHLORIDE 0.9 % WEIGHT BASED INFUSION
1.0000 mL/kg/h | INTRAVENOUS | Status: AC
  Administered 2018-09-09: 1 mL/kg/h via INTRAVENOUS

## 2018-09-09 MED ORDER — ASPIRIN 81 MG PO CHEW
81.0000 mg | CHEWABLE_TABLET | Freq: Every day | ORAL | Status: DC
  Administered 2018-09-09: 81 mg via ORAL
  Filled 2018-09-09 (×2): qty 1

## 2018-09-09 MED ORDER — HEPARIN (PORCINE) IN NACL 1000-0.9 UT/500ML-% IV SOLN
INTRAVENOUS | Status: AC
  Filled 2018-09-09: qty 1000

## 2018-09-09 MED ORDER — BISACODYL 5 MG PO TBEC: 5.0000 mg | DELAYED_RELEASE_TABLET | Freq: Every day | ORAL | Status: DC | PRN

## 2018-09-09 MED ORDER — LABETALOL HCL 5 MG/ML IV SOLN: 10.0000 mg | INTRAVENOUS | Status: AC | PRN

## 2018-09-09 MED ORDER — SODIUM CHLORIDE 0.9 % IV SOLN: 250.0000 mL | INTRAVENOUS | Status: DC | PRN

## 2018-09-09 MED ORDER — INSULIN ASPART 100 UNIT/ML ~~LOC~~ SOLN
0.0000 [IU] | Freq: Three times a day (TID) | SUBCUTANEOUS | Status: DC
  Administered 2018-09-09: 11 [IU] via SUBCUTANEOUS
  Administered 2018-09-09: 5 [IU] via SUBCUTANEOUS
  Administered 2018-09-09 – 2018-09-10 (×3): 3 [IU] via SUBCUTANEOUS
  Filled 2018-09-09 (×5): qty 1

## 2018-09-09 MED ORDER — SODIUM CHLORIDE 0.9 % IV SOLN
0.2500 mg/kg/h | INTRAVENOUS | Status: AC
  Filled 2018-09-09 (×2): qty 250

## 2018-09-09 SURGICAL SUPPLY — 17 items
BALLN TREK RX 3.0X12 (BALLOONS) ×3
BALLN ~~LOC~~ EUPHORA RX 3.25X15 (BALLOONS) ×3
BALLOON TREK RX 3.0X12 (BALLOONS) ×1 IMPLANT
BALLOON ~~LOC~~ EUPHORA RX 3.25X15 (BALLOONS) ×1 IMPLANT
CATH INFINITI 5FR ANG PIGTAIL (CATHETERS) ×3 IMPLANT
CATH INFINITI JR4 5F (CATHETERS) ×3 IMPLANT
CATH VISTA GUIDE 6FR XB3.5 SH (CATHETERS) ×3 IMPLANT
DEVICE CLOSURE MYNXGRIP 6/7F (Vascular Products) ×3 IMPLANT
DEVICE INFLAT 30 PLUS (MISCELLANEOUS) ×3 IMPLANT
KIT MANI 3VAL PERCEP (MISCELLANEOUS) ×3 IMPLANT
NEEDLE PERC 18GX7CM (NEEDLE) ×3 IMPLANT
PACK CARDIAC CATH (CUSTOM PROCEDURE TRAY) ×3 IMPLANT
SHEATH AVANTI 6FR X 11CM (SHEATH) ×3 IMPLANT
STENT SIERRA 3.00 X 23 MM (Permanent Stent) ×3 IMPLANT
SYR MEDRAD MARK V 150ML (SYRINGE) ×3 IMPLANT
WIRE G HI TQ BMW 190 (WIRE) ×3 IMPLANT
WIRE GUIDERIGHT .035X150 (WIRE) ×3 IMPLANT

## 2018-09-09 NOTE — Progress Notes (Signed)
Advanced care plan. Purpose of the Encounter: CODE STATUS Parties in Attendance: Patient Patient's Decision Capacity: Good Subjective/Patient's story: Presented to emergency room for chest pain Objective/Medical story Has STEMI, needs cardiac catheterization intervention Goals of care determination:  Advance care directives and goals of care discussed Treatment plan discussed Patient wants everything done which includes CPR, intubation and ventilator if the need arises CODE STATUS: Full code Time spent discussing advanced care planning: 16 minutes

## 2018-09-09 NOTE — Consult Note (Signed)
Reason for Consult: STEMI anterior wall Referring Physician: Marjean Donna, [place] is an 47 y.o. male.  HPI: Patient is a 47 year old black male reportedly has history of diabetes significant substance abuse has been having on and off chest discomfort over the last several days the patient actually called rescue a few weeks ago who came out to the house evaluated him thought that he was reasonably stable and recommended he may be reflux but to follow-up with a physician.  Patient obviously did not he had recurrent chest pain was taken medications for reflux and indigestion without improvement over the last 24 hours the pain is gotten significantly worse.  Patient has been using recurrent cocaine over the last few days the pain got particularly worse today 10 out of 10 today finally called rescue who got up bedside EKG which showed possible STEMI so the brought him to the emergency room for further assessment evaluation.  Patient was hemodynamically stable with 5 out of 10 chest pain.  Patient was then brought directly to the cardiac Cath Lab EKG had diffuse elevation anteriorly but unfortunately Q waves as well in the anterior septal leads.  Past Medical History:  Diagnosis Date  . Diabetes mellitus without complication [facility])     Past Surgical History:  Procedure Laterality Date  . none      History reviewed. No pertinent family history.  Social History:  reports that he has been smoking. He does not have any smokeless tobacco history on file. His alcohol and drug histories are not on file.  Allergies: No Known Allergies  Medications: I have reviewed the patient's current medications.  Results for orders placed or performed during the hospital encounter of 09/09/18 (from the past 48 hour(s))  Basic metabolic panel     Status: Abnormal   Collection Time: 09/09/18  4:37 AM  Result Value Ref Range   Sodium 137 135 - 145 mmol/L   Potassium 4.0 3.5 - 5.1 mmol/L   Chloride  103 98 - 111 mmol/L   CO2 26 22 - 32 mmol/L   Glucose, Bld 236 (H) 70 - 99 mg/dL   BUN 9 6 - 20 mg/dL   Creatinine, Ser 1.09 0.61 - 1.24 mg/dL   Calcium 8.7 (L) 8.9 - 10.3 mg/dL   GFR calc non Af Amer >60 >60 mL/min   GFR calc Af Amer >60 >60 mL/min    Comment: (NOTE) The eGFR has been calculated using the CKD EPI equation. This calculation has not been validated in all clinical situations. eGFR's persistently <60 mL/min signify possible Chronic Kidney Disease.    Anion gap 8 5 - 15    Comment: Performed at [facility], [address] [place], [place] [address]  CBC     Status: Abnormal   Collection Time: 09/09/18  4:37 AM  Result Value Ref Range   WBC 11.8 (H) 3.8 - 10.6 K/uL   RBC 4.83 4.40 - 5.90 MIL/uL   Hemoglobin 14.4 13.0 - 18.0 g/dL   HCT 42.9 40.0 - 52.0 %   MCV 88.9 80.0 - 100.0 fL   MCH 29.9 26.0 - 34.0 pg   MCHC 33.6 32.0 - 36.0 g/dL   RDW 14.2 11.5 - 14.5 %   Platelets 263 150 - 440 K/uL    Comment: Performed at [facility], [address] [place], [place] [address]  Troponin I     Status: Abnormal   Collection Time: 09/09/18  4:37 AM  Result Value  Ref Range   Troponin I 9.16 (HH) <0.03 ng/mL    Comment: CRITICAL RESULT CALLED TO, READ BACK BY AND VERIFIED WITH  JENNIFER DALEY AT 3568 09/09/18 SDR Performed at [facility], [address], [place], [place] [address]   POCT Activated clotting time     Status: None   Collection Time: 09/09/18  5:34 AM  Result Value Ref Range   Activated Clotting Time 384 seconds    Dg Chest Port 1 View  Result Date: 09/09/2018 CLINICAL DATA:  47 year old male with chest pain. EXAM: PORTABLE CHEST 1 VIEW COMPARISON:  Chest radiograph dated 06/02/2016 FINDINGS: No focal consolidation, pleural effusion, or pneumothorax. The cardiac silhouette is within normal limits. No acute osseous pathology. IMPRESSION: No active disease. Electronically Signed   By: Anner Crete M.D.   On: 09/09/2018 05:30     Review of Systems  Constitutional: Positive for diaphoresis and malaise/fatigue.  HENT: Positive for congestion.   Eyes: Negative.   Respiratory: Positive for shortness of breath.   Cardiovascular: Positive for chest pain, orthopnea and PND.  Gastrointestinal: Positive for heartburn.  Genitourinary: Negative.   Musculoskeletal: Negative.   Skin: Negative.   Neurological: Positive for weakness.  Endo/Heme/Allergies: Negative.   Psychiatric/Behavioral: Positive for substance abuse.   Blood pressure (!) 170/117, pulse 99, resp. rate 19, height 5' 10"  (1.778 m), weight 80.1 kg, SpO2 100 %. Physical Exam  Nursing note and vitals reviewed. Constitutional: He is oriented to person, place, and time. He appears well-developed and well-nourished.  HENT:  Head: Normocephalic and atraumatic.  Eyes: Pupils are equal, round, and reactive to light. Conjunctivae and EOM are normal.  Neck: Normal range of motion. Neck supple.  Cardiovascular: Normal rate and regular rhythm. Exam reveals gallop.  Murmur heard. Respiratory: Effort normal and breath sounds normal.  GI: Soft. Bowel sounds are normal.  Musculoskeletal: Normal range of motion.  Neurological: He is oriented to person, place, and time. He has normal reflexes.  Skin: Skin is warm and dry.  Psychiatric: He has a normal mood and affect.    Assessment/Plan: STEMI anterior wall Abnormal EKG Elevated troponin Hypertension Substance abuse cocaine Diabetes . Plan Admit directly to the Cath Lab for cardiac cath with possible PCI and stent Aspirin Plavix Recommend beta-blocker ACE inhibitor Statin therapy Lipitor 80 mg a day Advised patient refrain from tobacco abuse Advised patient to refrain from substance abuse Recommend diabetes management and control  Araf Clugston D Niveah Boerner 09/09/2018, 6:34 AM

## 2018-09-09 NOTE — Progress Notes (Signed)
[place] at [place] NAME: Joshua [name]    MR#:  [id number]  DATE OF BIRTH:  1971-05-05  SUBJECTIVE:  CHIEF COMPLAINT:   Chief Complaint  Patient presents with  . Code STEMI  Patient seen and evaluated today this morning No new episodes of chest pain status post cardiac catheterization and stent placement No shortness of breath  REVIEW OF SYSTEMS:    ROS  CONSTITUTIONAL: No documented fever. No fatigue, weakness. No weight gain, no weight loss.  EYES: No blurry or double vision.  ENT: No tinnitus. No postnasal drip. No redness of the oropharynx.  RESPIRATORY: No cough, no wheeze, no hemoptysis. No dyspnea.  CARDIOVASCULAR: No chest pain. No orthopnea. No palpitations. No syncope.  GASTROINTESTINAL: No nausea, no vomiting or diarrhea. No abdominal pain. No melena or hematochezia.  GENITOURINARY: No dysuria or hematuria.  ENDOCRINE: No polyuria or nocturia. No heat or cold intolerance.  HEMATOLOGY: No anemia. No bruising. No bleeding.  INTEGUMENTARY: No rashes. No lesions.  MUSCULOSKELETAL: No arthritis. No swelling. No gout.  NEUROLOGIC: No numbness, tingling, or ataxia. No seizure-type activity.  PSYCHIATRIC: No anxiety. No insomnia. No ADD.   DRUG ALLERGIES:  No Known Allergies  VITALS:  Blood pressure (!) 146/94, pulse 98, temperature 98.3 F (36.8 C), temperature source Oral, resp. rate 18, height 5\' 10"  (1.778 m), weight 79 kg, SpO2 99 %.  PHYSICAL EXAMINATION:   Physical Exam  GENERAL:  47 y.o.-year-old patient lying in the bed with no acute distress.  EYES: Pupils equal, round, reactive to light and accommodation. No scleral icterus. Extraocular muscles intact.  HEENT: Head atraumatic, normocephalic. Oropharynx and nasopharynx clear.  NECK:  Supple, no jugular venous distention. No thyroid enlargement, no tenderness.  LUNGS: Normal breath sounds bilaterally, no wheezing, rales, rhonchi. No use of accessory muscles of  respiration.  CARDIOVASCULAR: S1, S2 normal. No murmurs, rubs, or gallops.  ABDOMEN: Soft, nontender, nondistended. Bowel sounds present. No organomegaly or mass.  EXTREMITIES: No cyanosis, clubbing or edema b/l.    NEUROLOGIC: Cranial nerves II through XII are intact. No focal Motor or sensory deficits b/l.   PSYCHIATRIC: The patient is alert and oriented x 3.  SKIN: No obvious rash, lesion, or ulcer.   LABORATORY PANEL:   CBC Recent Labs  Lab 09/09/18 0437  WBC 11.8*  HGB 14.4  HCT 42.9  PLT 263   ------------------------------------------------------------------------------------------------------------------ Chemistries  Recent Labs  Lab 09/09/18 0437  NA 137  K 4.0  CL 103  CO2 26  GLUCOSE 236*  BUN 9  CREATININE 1.09  CALCIUM 8.7*   ------------------------------------------------------------------------------------------------------------------  Cardiac Enzymes Recent Labs  Lab 09/09/18 0703  TROPONINI >65.00*   ------------------------------------------------------------------------------------------------------------------  RADIOLOGY:  Dg Chest Port 1 View  Result Date: 09/09/2018 CLINICAL DATA:  47 year old male with chest pain. EXAM: PORTABLE CHEST 1 VIEW COMPARISON:  Chest radiograph dated 06/02/2016 FINDINGS: No focal consolidation, pleural effusion, or pneumothorax. The cardiac silhouette is within normal limits. No acute osseous pathology. IMPRESSION: No active disease. Electronically Signed   By: Anner Crete M.D.   On: 09/09/2018 05:30     ASSESSMENT AND PLAN:   47 year old male patient with history of for diabetes mellitus type 2, cocaine abuse, tobacco abuse presented to emergency room with code STEMI  -STEMI Status post cardiac catheterization 100% mid LAD lesion which was stented 100% RCA 75% left circumflex EF 30% Continue aspirin Plavix, statin, beta-blocker and ACE inhibitors  -Substance abuse counseling given to the  patient  -  Tobacco cessation counseled to the patient for 6 minutes Nicotine patch offered  -Type 2 diabetes mellitus Diabetic diet  All the records are reviewed and case discussed with Care Management/Social Worker. Management plans discussed with the patient, family and they are in agreement.  CODE STATUS: Full code  DVT Prophylaxis: SCDs  TOTAL TIME TAKING CARE OF THIS PATIENT: 45 minutes.   POSSIBLE D/C IN 1 to 2 DAYS, DEPENDING ON CLINICAL CONDITION.  Saundra Shelling M.D on 09/09/2018 at 11:05 AM  Between 7am to 6pm - Pager - [phone]  After 6pm go to [email or web address] - password [email or web address] [facility]  Office  [phone]  CC: Primary care physician; Patient, No Pcp Per  Note: This dictation was prepared with Dragon dictation along with smaller phrase technology. Any transcriptional errors that result from this process are unintentional.

## 2018-09-09 NOTE — ED Notes (Signed)
AT THIS TIME PT HAS MET ALL PRE CATH LAB PROTOCOL FOR STEMI PT. AWAITING CARDIOLOGIST, [place], MD.

## 2018-09-09 NOTE — Progress Notes (Signed)
Blood sugar was checked.  It resulted in 59.  A repeat was done on the opposite hand which resulted in 58.  Pt was given orange juice, applesauce and a sandwich which brought the blood sugar to 142.  Pt is alert and oriented.

## 2018-09-09 NOTE — Consult Note (Signed)
Name: Joshua [name] MRN: [id number] DOB: 1971/06/02    ADMISSION DATE:  09/09/2018 CONSULTATION DATE:  09/09/2018  REFERRING MD :  Dr. Clayborn Bigness  CHIEF COMPLAINT:  Chest pain, STEMI  BRIEF PATIENT DESCRIPTION:  47 y.o. Male admitted with STEMI.  Underwent emergent Cardiac catherization with PCI and DES to Proximal LAD which had 100% stenosis.  Pt with multiple vessel disease.  SIGNIFICANT EVENTS  09/09/18>> Admission to [facility] ICU post PCI  STUDIES:  Cardiac catheterization 09/09/18>> STEMI presentation anterior wall Multivessel coronary disease 100% mid LAD TIMI 0 was the culprit lesion IRA Serial disease in the mid circumflex multiple lesions in the mid RCA and distal Distal 100% RCA with collaterals left to right Successful PCI and stent of mid 100% LAD which was the IRA DES stent deployed 3.0 x 23 mm Xience [place] with a 3.25 x 15 mm euphoria to 15 atm TIMI-3 flow was restored from TIMI 0 in the LAD Circumflex and RCA were deferred for possible staged procedure Left ventricular function will be reassessed as an outpatient   HISTORY OF PRESENT ILLNESS:   Joshua [name] is a 47 y.o. Male with a PMH of DM who presents to [facility] ED on 09/09/18 with c/o chest pain.  He describes the chest pain as central with radiation bilaterally to both arms, and a duration since yesterday that worsened overnight prompting him to call EMS. EMS's initial EKG concerning for STEMI, and repeat EKG in the ED was unchanged. Initial workup revealed Troponin 9.16, WBC 11.8, and glucose 236.  CXR was negative. Pt received aspirin, Nitropaste, Morphine, Heparin, and 600 mg Plavix.  Cardiology was consulted and took pt emergently to cath lab for Cardiac Catheterization. Cardiac catheterization concerning for multivessel disease, with the Proximal LAD noted as the culprit lesion with 100% Stenosis requiring PCI and drug eluting stent.  He is admitted to ICU post procedure.  PCCM is consulted for further  management.  Of note, pt does report cocaine use.  PAST MEDICAL HISTORY :   has a past medical history of Diabetes mellitus without complication ([place]).  has a past surgical history that includes none. Prior to Admission medications   Not on File   No Known Allergies  FAMILY HISTORY:  family history is not on file. SOCIAL HISTORY:  reports that he has been smoking. He does not have any smokeless tobacco history on file.  REVIEW OF SYSTEMS:  Positives in BOLD: Denies all complaints Constitutional: Negative for fever, chills, weight loss, malaise/fatigue and diaphoresis.  HENT: Negative for hearing loss, ear pain, nosebleeds, congestion, sore throat, neck pain, tinnitus and ear discharge.   Eyes: Negative for blurred vision, double vision, photophobia, pain, discharge and redness.  Respiratory: Negative for cough, hemoptysis, sputum production, shortness of breath, wheezing and stridor.   Cardiovascular: Negative for chest pain, palpitations, orthopnea, claudication, leg swelling and PND.  Gastrointestinal: Negative for heartburn, nausea, vomiting, abdominal pain, diarrhea, constipation, blood in stool and melena.  Genitourinary: Negative for dysuria, urgency, frequency, hematuria and flank pain.  Musculoskeletal: Negative for myalgias, back pain, joint pain and falls.  Skin: Negative for itching and rash.  Neurological: Negative for dizziness, tingling, tremors, sensory change, speech change, focal weakness, seizures, loss of consciousness, weakness and headaches.  Endo/Heme/Allergies: Negative for environmental allergies and polydipsia. Does not bruise/bleed easily.  SUBJECTIVE:  Pt sleeping, resting comfortably Denies chest pain  VITAL SIGNS: Pulse Rate:  [92-102] 99 (09/27 0451) Resp:  [14-19] 19 (09/27 0451) BP: (170-178)/(108-117) 170/117 (09/27 0451)  SpO2:  [100 %] 100 % (09/27 0451) Weight:  [80.1 kg] 80.1 kg (09/27 0451)  PHYSICAL EXAMINATION: General:  Acutely ill  appearing male, laying in bed asleep, in NAD Neuro:  Sleeping, arouses to voice, follows simple commands, no focal deficits HEENT:  Atraumatic, normocephalic, neck supple, no JVD Cardiovascular:  RRR, s1s2, no M/R/G, 2+ pulses throughout Lungs:  Clear bilaterally, even, nonlabored, normal effort Abdomen:  Soft, nontender, nondistended, Bs+ x4 Musculoskeletal:  Normal bulk and tone, no deformities Skin:  Warm/dry.  No obvious rashes, lesions, or ulcerations  Recent Labs  Lab 09/09/18 0437  NA 137  K 4.0  CL 103  CO2 26  BUN 9  CREATININE 1.09  GLUCOSE 236*   Recent Labs  Lab 09/09/18 0437  HGB 14.4  HCT 42.9  WBC 11.8*  PLT 263   Dg Chest Port 1 View  Result Date: 09/09/2018 CLINICAL DATA:  47 year old male with chest pain. EXAM: PORTABLE CHEST 1 VIEW COMPARISON:  Chest radiograph dated 06/02/2016 FINDINGS: No focal consolidation, pleural effusion, or pneumothorax. The cardiac silhouette is within normal limits. No acute osseous pathology. IMPRESSION: No active disease. Electronically Signed   By: Anner Crete M.D.   On: 09/09/2018 05:30    ASSESSMENT / PLAN:  A: STEMI P: -Cardiology following, appreciate input -S/p Cardiac cath with PCI and DES -Cardiac monitoring -Trend Troponin -Aspirin, Plavix, Statin, Lisinopril, Metoprolol per Cardiology -Maintain MAP >65 -IVF -Angiomax gtt per Cardiology  A:  DM P: -CBG's -SSI -Follow ICU Hypo/hyperglycemia protocol -Check Hgb A1c  A: Substance abuse P: -Urine drug screen -Encourage refraining from substance use   Disposition: ICU Goals of Care: Full Code VTE prophylaxis: SCD's     Darel Hong, AGACNP-BC [facility] Pager: [phone]   09/09/2018, 5:47 AM

## 2018-09-09 NOTE — ED Triage Notes (Signed)
Pt arrived via [facility] from home as a CODE STEMI. EMS states pt was feeling heartburn and substernal chest pain. Upon arrival, patient having numbness in both arms. EMS gave aspirin 324 mg, nitro paste 1 inch.

## 2018-09-09 NOTE — H&P (Signed)
[place] at [place] NAME: Joshua [name]    MR#:  [id number]  DATE OF BIRTH:  06-12-1971  DATE OF ADMISSION:  09/09/2018  PRIMARY CARE PHYSICIAN: Patient, No Pcp Per   REQUESTING/REFERRING PHYSICIAN: Yolonda Kida, MD  CHIEF COMPLAINT:   Chief Complaint  Patient presents with  . Code STEMI    HISTORY OF PRESENT ILLNESS:  Joshua [name]  is a 47 y.o. male with a known history of T2NIDDM, cocaine abuse p/w CP x1d. Used cocaine several days ago. I am seeing the pt in ICU s/p cardiac catheterization by Dr. Clayborn Bigness. I am told the pt had a 100% mid-LAD lesion that was stented. He apparently also had a 100% RCA, 75% LCx and an EF of 30%. He was recommended to be started on ASA and Plavix. I have also ordered statin, beta blocker and ACE-I, as pt's BP tolerates.  The pt himself does not want to talk to me. He endorses some mild residual chest pain, but otherwise largely ignores me. I told him he need to stop using cocaine. He seems uninterested in what I have to say.  PAST MEDICAL HISTORY:   Past Medical History:  Diagnosis Date  . Diabetes mellitus without complication ([place])     PAST SURGICAL HISTORY:   Past Surgical History:  Procedure Laterality Date  . CORONARY/GRAFT ACUTE MI REVASCULARIZATION N/A 09/09/2018   Procedure: Coronary/Graft Acute MI Revascularization;  Surgeon: Yolonda Kida, MD;  Location: [facility];  Service: Cardiovascular;  Laterality: N/A;  . LEFT HEART CATH AND CORONARY ANGIOGRAPHY N/A 09/09/2018   Procedure: LEFT HEART CATH AND CORONARY ANGIOGRAPHY;  Surgeon: Yolonda Kida, MD;  Location: [facility];  Service: Cardiovascular;  Laterality: N/A;  . none      SOCIAL HISTORY:   Social History   Tobacco Use  . Smoking status: Current Every Day Smoker  Substance Use Topics  . Alcohol use: Not on file    FAMILY HISTORY:  History reviewed. No pertinent family history.  DRUG  ALLERGIES:  No Known Allergies  REVIEW OF SYSTEMS:   Review of Systems  Constitutional: Negative for chills, diaphoresis, fever, malaise/fatigue and weight loss.  HENT: Negative for congestion, ear pain, hearing loss, nosebleeds, sinus pain, sore throat and tinnitus.   Eyes: Negative for blurred vision, double vision and photophobia.  Respiratory: Negative for cough, hemoptysis, sputum production, shortness of breath and wheezing.   Cardiovascular: Positive for chest pain. Negative for palpitations, orthopnea, claudication, leg swelling and PND.  Gastrointestinal: Negative for abdominal pain, blood in stool, constipation, diarrhea, heartburn, melena, nausea and vomiting.  Genitourinary: Negative for dysuria, flank pain, frequency, hematuria and urgency.  Musculoskeletal: Negative for back pain, falls, joint pain, myalgias and neck pain.  Skin: Negative for itching and rash.  Neurological: Negative for dizziness, tremors, sensory change, speech change, focal weakness, seizures, loss of consciousness, weakness and headaches.  Psychiatric/Behavioral: Negative for memory loss. The patient does not have insomnia.    MEDICATIONS AT HOME:   Prior to Admission medications   Not on File      VITAL SIGNS:  Blood pressure (!) 143/97, pulse 90, temperature 98.8 F (37.1 C), temperature source Oral, resp. rate 13, height 5\' 10"  (5.726 m), weight 79 kg, SpO2 100 %.  PHYSICAL EXAMINATION:  Physical Exam  Constitutional: He is oriented to person, place, and time. He appears well-developed and well-nourished. He is active and cooperative.  Non-toxic appearance. He does  not have a sickly appearance. He does not appear ill. No distress. He is not intubated.  HENT:  Head: Normocephalic and atraumatic.  Mouth/Throat: Oropharynx is clear and moist. No oropharyngeal exudate.  Eyes: Conjunctivae, EOM and lids are normal. No scleral icterus.  Neck: Neck supple. No JVD present. No thyromegaly present.    Cardiovascular: Normal rate, regular rhythm, S1 normal, S2 normal and normal heart sounds.  No extrasystoles are present. Exam reveals no gallop, no S3, no S4, no distant heart sounds and no friction rub.  No murmur heard. Pulmonary/Chest: Effort normal and breath sounds normal. No accessory muscle usage or stridor. No apnea, no tachypnea and no bradypnea. He is not intubated. No respiratory distress. He has no decreased breath sounds. He has no wheezes. He has no rhonchi. He has no rales.  Abdominal: Soft. He exhibits no distension. There is no tenderness. There is no rigidity, no rebound and no guarding.  Musculoskeletal: Normal range of motion. He exhibits no edema or tenderness.  Lymphadenopathy:    He has no cervical adenopathy.  Neurological: He is alert and oriented to person, place, and time. He is not disoriented.  Skin: Skin is warm and dry. No rash noted. He is not diaphoretic. No erythema.  Psychiatric: He has a normal mood and affect. Judgment and thought content normal. He is withdrawn. Cognition and memory are normal. He is noncommunicative.   LABORATORY PANEL:   CBC Recent Labs  Lab 09/09/18 0437  WBC 11.8*  HGB 14.4  HCT 42.9  PLT 263   ------------------------------------------------------------------------------------------------------------------  Chemistries  Recent Labs  Lab 09/09/18 0437  NA 137  K 4.0  CL 103  CO2 26  GLUCOSE 236*  BUN 9  CREATININE 1.09  CALCIUM 8.7*   ------------------------------------------------------------------------------------------------------------------  Cardiac Enzymes Recent Labs  Lab 09/09/18 0703  TROPONINI >65.00*   ------------------------------------------------------------------------------------------------------------------  RADIOLOGY:  Dg Chest Port 1 View  Result Date: 09/09/2018 CLINICAL DATA:  47 year old male with chest pain. EXAM: PORTABLE CHEST 1 VIEW COMPARISON:  Chest radiograph dated  06/02/2016 FINDINGS: No focal consolidation, pleural effusion, or pneumothorax. The cardiac silhouette is within normal limits. No acute osseous pathology. IMPRESSION: No active disease. Electronically Signed   By: Anner Crete M.D.   On: 09/09/2018 05:30   IMPRESSION AND PLAN:   A/P: 40M p/w anterior STEMI, cardiac catheterization (+) 100% mid-LAD lesion, s/p 1x DES. Hyperglycemia (w/ T2NIDDM). -CP, STEMI: Cardiac monitoring. Trend Trop-I. Serial EKG. ASA, Plavix, statin, beta blocker, ACE-I. Morphine, NTG, O2. Advised cocaine cessation and medication adherence. Has multi-vessel disease and cardiomyopathy, will need outpt Cardiology f/up. -Hyperglycemia, T2NIDDM: SSI. -FEN/GI: Cardiac diabetic diet. -DVT PPx: SCDs. -Code status: Full code. -Disposition: Admission, > 2 midnights.   All the records are reviewed and case discussed with ED provider. Management plans discussed with the patient, family and they are in agreement.  CODE STATUS: Full code.  TOTAL TIME TAKING CARE OF THIS PATIENT: 60 minutes.    Arta Silence M.D on 09/09/2018 at 9:26 AM  Between 7am to 6pm - Pager - [phone]  After 6pm go to [email or web address] - Proofreader  [facility]  Office  [phone]  CC: Primary care physician; Patient, No Pcp Per   Note: This dictation was prepared with Dragon dictation along with smaller phrase technology. Any transcriptional errors that result from this process are unintentional.

## 2018-09-09 NOTE — ED Notes (Addendum)
Pt belongings placed in belongings bag labeled with pts name. Belongings consist of grey sweat pants, underwear, a pair of black socks, and a pair of camouflage crocks.

## 2018-09-09 NOTE — ED Notes (Signed)
Patient transported to Cath Lab. 

## 2018-09-09 NOTE — Progress Notes (Signed)
eLink Physician-Brief Progress Note Patient Name: Joshua [name] DOB: 1971/07/31 MRN: [id number]   Date of Service  09/09/2018  HPI/Events of Note  Pt admitted s/p STEMI and cardiac cath. Hemodynamically stable.  eICU Interventions  Routine post-STEMI and post-Cath care per cardiology recommendations.        Kerry Kass Romy Mcgue 09/09/2018, 7:07 AM

## 2018-09-09 NOTE — ED Notes (Signed)
Pt given heparin 4000 units.

## 2018-09-09 NOTE — ED Notes (Signed)
[place] at bedside.  

## 2018-09-09 NOTE — ED Provider Notes (Signed)
[facility] Emergency Department Provider Note ______________________  Time seen 4:31 AM  I have reviewed the triage vital signs and the nursing notes.   HISTORY  Chief Complaint Code STEMI    HPI Joshua [name] is a 47 y.o. male 3 diabetes presents to the emergency department via EMS code STEMI.  Patient states that he has had persistent central chest pain with radiation to bilateral arms all day".  Patient states that the pain acutely worsened tonight.  Patient does admit to cocaine use 2 days ago.  Patient denies any dyspnea.     Past Medical History:  Diagnosis Date  . Diabetes mellitus without complication [facility])     Patient Active Problem List   Diagnosis Date Noted  . Hypoglycemia 06/02/2016    Past Surgical History:  Procedure Laterality Date  . none      Prior to Admission medications   Not on File    Allergies No known drug allergies No family history on file.  Social History Social History   Tobacco Use  . Smoking status: Current Every Day Smoker  Substance Use Topics  . Alcohol use: Not on file  . Drug use: Not on file    Comment: pt states no found unknown white substance in mouth    Review of Systems Constitutional: No fever/chills Eyes: No visual changes. ENT: No sore throat. Cardiovascular: Positive for chest pain. Respiratory: Denies shortness of breath. Gastrointestinal: No abdominal pain.  No nausea, no vomiting.  No diarrhea.  No constipation. Genitourinary: Negative for dysuria. Musculoskeletal: Negative for neck pain.  Negative for back pain. Integumentary: Negative for rash. Neurological: Negative for headaches, focal weakness or numbness.  ____________________________________________   PHYSICAL EXAM:  VITAL SIGNS: ED Triage Vitals  Enc Vitals Group     BP      Pulse      Resp      Temp      Temp src      SpO2      Weight      Height      Head Circumference      Peak Flow    Pain Score      Pain Loc      Pain Edu?      Excl. in [place]?     Constitutional: Alert and oriented. Well appearing and in no acute distress. Eyes: Conjunctivae are normal. PERRL. EOMI. Head: Atraumatic. Ears:  Healthy appearing ear canals and TMs bilaterally Nose: No congestion/rhinnorhea. Mouth/Throat: Mucous membranes are moist.  Oropharynx non-erythematous. Neck: No stridor.  Cardiovascular: Normal rate, regular rhythm. Good peripheral circulation. Grossly normal heart sounds. Respiratory: Normal respiratory effort.  No retractions. Lungs CTAB. Gastrointestinal: Soft and nontender. No distention.    Musculoskeletal: No lower extremity tenderness nor edema. No gross deformities of extremities. Neurologic:  Normal speech and language. No gross focal neurologic deficits are appreciated.  Skin:  Skin is warm, dry and intact. No rash noted. Psychiatric: Mood and affect are normal. Speech and behavior are normal.  ____________________________________________   LABS (all labs ordered are listed, but only abnormal results are displayed)  Labs Reviewed  CBC - Abnormal; Notable for the following components:      Result Value   WBC 11.8 (*)    All other components within normal limits  BASIC METABOLIC PANEL  TROPONIN I  URINE DRUG SCREEN, QUALITATIVE ([facility] ONLY)   ____________________________________________  EKG  ED ECG REPORT I, [place] N BROWN, the attending physician, personally  viewed and interpreted this ECG. E MS EKG  Date: 09/09/2018  EKG Time: 4:08 AM  Rate: 79  Rhythm: Normal sinus rhythm ST segment elevation V3 4 5 exceeding 2 mm.  Axis: Normal  Intervals: Normal  ST&T Change: ST segment elevation V3 V4 and V5 exceeding 2 mm.   ED EKG ED ECG REPORT I, [place] N BROWN, the attending physician, personally viewed and interpreted this ECG.   Date: 09/09/2018  EKG Time: 4:35 AM  Rate: 85  Rhythm: Normal sinus rhythm  Axis: Normal  Intervals: QT prolongation  QTC 513  ST&T Change: ST segment elevation V3, V4 exceeding 2 mm.  ____________________________________________   CRITICAL CARE Performed by: Gregor Hams   Total critical care time: 30 minutes  Critical care time was exclusive of separately billable procedures and treating other patients.  Critical care was necessary to treat or prevent imminent or life-threatening deterioration.  Critical care was time spent personally by me on the following activities: development of treatment plan with patient and/or surrogate as well as nursing, discussions with consultants, evaluation of patient's response to treatment, examination of patient, obtaining history from patient or surrogate, ordering and performing treatments and interventions, ordering and review of laboratory studies, ordering and review of radiographic studies, pulse oximetry and re-evaluation of patient's condition.   ____________________________________________   INITIAL IMPRESSION / ASSESSMENT AND PLAN / ED COURSE  As part of my medical decision making, I reviewed the following data within the electronic MEDICAL RECORD NUMBER   47 year old male presenting with above-stated history and physical exam concerning for ST elevation MI.  Repeat EKG in the emergency department concerning for the same as such patient given aspirin 3 and 24 mg via EMS Nitropaste was applied to the patient's chest.  Patient given IV morphine in the emergency department as well as heparin.  Patient also given Plavix 600 mg after discussion with Dr. call with cardiologist on-call.    ____________________________________________  FINAL CLINICAL IMPRESSION(S) / ED DIAGNOSES  Final diagnoses:  Acute ST elevation myocardial infarction (STEMI) of anterior wall ([place])     MEDICATIONS GIVEN DURING THIS VISIT:  Medications  morphine 2 MG/ML injection 2 mg (2 mg Intravenous Given 09/09/18 0438)  clopidogrel (PLAVIX) tablet 600 mg (600 mg Oral Given 09/09/18  0458)     ED Discharge Orders    None       Note:  This document was prepared using Dragon voice recognition software and may include unintentional dictation errors.    Gregor Hams, MD 09/09/18 [phone]

## 2018-09-10 ENCOUNTER — Encounter: Payer: Self-pay | Admitting: *Deleted

## 2018-09-10 DIAGNOSIS — Z955 Presence of coronary angioplasty implant and graft: Secondary | ICD-10-CM

## 2018-09-10 LAB — CBC
HCT: 39.5 % — ABNORMAL LOW (ref 40.0–52.0)
HEMOGLOBIN: 13.4 g/dL (ref 13.0–18.0)
MCH: 30.3 pg (ref 26.0–34.0)
MCHC: 33.9 g/dL (ref 32.0–36.0)
MCV: 89.6 fL (ref 80.0–100.0)
PLATELETS: 227 10*3/uL (ref 150–440)
RBC: 4.4 MIL/uL (ref 4.40–5.90)
RDW: 14.3 % (ref 11.5–14.5)
WBC: 9.7 10*3/uL (ref 3.8–10.6)

## 2018-09-10 LAB — GLUCOSE, CAPILLARY
GLUCOSE-CAPILLARY: 161 mg/dL — AB (ref 70–99)
Glucose-Capillary: 192 mg/dL — ABNORMAL HIGH (ref 70–99)
Glucose-Capillary: 185 mg/dL — ABNORMAL HIGH (ref 70–99)

## 2018-09-10 LAB — BASIC METABOLIC PANEL
Anion gap: 6 (ref 5–15)
BUN: 12 mg/dL (ref 6–20)
CALCIUM: 8 mg/dL — AB (ref 8.9–10.3)
CHLORIDE: 105 mmol/L (ref 98–111)
CO2: 26 mmol/L (ref 22–32)
CREATININE: 1.03 mg/dL (ref 0.61–1.24)
Glucose, Bld: 259 mg/dL — ABNORMAL HIGH (ref 70–99)
Potassium: 4.4 mmol/L (ref 3.5–5.1)
SODIUM: 137 mmol/L (ref 135–145)

## 2018-09-10 LAB — MAGNESIUM: MAGNESIUM: 2.2 mg/dL (ref 1.7–2.4)

## 2018-09-10 LAB — LIPID PANEL
CHOL/HDL RATIO: 3.2 ratio
CHOLESTEROL: 123 mg/dL (ref 0–200)
HDL: 38 mg/dL — ABNORMAL LOW (ref >40)
LDL Cholesterol: 54 mg/dL (ref 0–99)
Triglycerides: 155 mg/dL — ABNORMAL HIGH (ref <150)
VLDL: 31 mg/dL (ref 0–40)

## 2018-09-10 LAB — HIV ANTIBODY (ROUTINE TESTING W REFLEX): HIV Screen 4th Generation wRfx: NONREACTIVE

## 2018-09-10 LAB — PHOSPHORUS: PHOSPHORUS: 3.2 mg/dL (ref 2.5–4.6)

## 2018-09-10 MED ORDER — NITROGLYCERIN 0.4 MG SL SUBL: 0.4000 mg | SUBLINGUAL_TABLET | SUBLINGUAL | 2 refills | Status: DC | PRN

## 2018-09-10 MED ORDER — ATORVASTATIN CALCIUM 80 MG PO TABS: 80.0000 mg | ORAL_TABLET | Freq: Every day | ORAL | 2 refills | Status: DC

## 2018-09-10 MED ORDER — CLOPIDOGREL BISULFATE 75 MG PO TABS: 75.0000 mg | ORAL_TABLET | Freq: Every day | ORAL | 2 refills | Status: DC

## 2018-09-10 MED ORDER — LISINOPRIL 5 MG PO TABS: 5.0000 mg | ORAL_TABLET | Freq: Every day | ORAL | 1 refills | Status: DC

## 2018-09-10 MED ORDER — ASPIRIN 81 MG PO TBEC: 81.0000 mg | DELAYED_RELEASE_TABLET | Freq: Every day | ORAL | 2 refills | Status: DC

## 2018-09-10 MED ORDER — METOPROLOL SUCCINATE ER 25 MG PO TB24: 25.0000 mg | ORAL_TABLET | Freq: Every day | ORAL | 1 refills | Status: DC

## 2018-09-10 MED ORDER — METFORMIN HCL 500 MG PO TABS: 500.0000 mg | ORAL_TABLET | Freq: Two times a day (BID) | ORAL | 1 refills | Status: DC

## 2018-09-10 NOTE — Care Management Note (Signed)
Case Management Note  Patient Details  Name: Joshua [name] MRN: [id number] Date of Birth: 24-Apr-1971  Subjective/Objective:   RNCM consulted for medication assistance. Patient without payer source and has been noted to not obtain medications likely due to pricing or noncompliance. Provided medication management application as well as Dealer. All current medications are on [facility] $4 list with the exception of plavix which at the lowest price is $9. Patient agreeable to fill these scripts at [facility] this weekend and then go to the Medication management in the future. Provided charity glucometer and strips since he does not have the resources to check his blood sugar. No further RNCM needs. Patient will discharge today with transportation from family.  Joshua [name] Joshua Alsop RN BSN RNCM [phone]                   Action/Plan:   Expected Discharge Date:  09/10/18               Expected Discharge Plan:  Home/Self Care  In-House Referral:     Discharge planning Services  CM Consult, [facility], Medication Assistance  Post Acute Care Choice:    Choice offered to:     DME Arranged:    DME Agency:     HH Arranged:    HH Agency:     Status of Service:  Completed, signed off  If discussed at [employer], dates discussed:    Additional Comments:  Joshua [name] Joshua Brynlei Klausner, RN 09/10/2018, 1:15 PM

## 2018-09-10 NOTE — Progress Notes (Signed)
Patient now says he does not wish to go home related to "you will check my sugar, give me insulin, give me good food, I can lay in this bed and watch tv and provide a place where I wont smoke." Nurse asked patient if he did not have access to food at home and his response was, "nevermind, ill leave." "I want to take all this stuff off (pointing at wires and to his chest.) remove that thing they put inside of me." Nurse reiterated the important of taking medications prescribed, care management is consulted to help with prescriptions, managing diabetes and how the stent placement is why he was feeling better today.

## 2018-09-10 NOTE — Progress Notes (Signed)
Patient states he would rather have insulin, he used to buy medications from others and give it to himself.

## 2018-09-10 NOTE — Discharge Instructions (Signed)
Cardiac Rehabilitation What is cardiac rehabilitation? Cardiac rehabilitation is a treatment program that helps improve the health and well-being of people who have heart problems. Cardiac rehabilitation includes exercise training, education, and counseling to help you get stronger and return to an active lifestyle. This program can help you get better faster and reduce any future hospital stays. Why might I need cardiac rehabilitation?  Cardiac rehabilitation programs can help when you have or have had:  A heart attack.  Heart failure.  Peripheral artery disease.  Coronary artery disease.  Angina.  Lung or breathing problems.  Cardiac rehabilitation programs are also used when you have had:  Coronary artery bypass graft surgery.  Heart valve replacement.  Heart stent placement.  Heart transplant.  Aneurysm repair.  What are the benefits of cardiac rehabilitation? Cardiac rehabilitation can help:  Reduce problems like chest pain and trouble breathing.  Change risk factors that contribute to heart disease, such as: ? Smoking. ? High blood pressure. ? High cholesterol. ? Diabetes. ? Being out of shape or not active. ? Weighing more than 30% higher than your ideal weight. ? Diet.  Improve your mental outlook so you feel: ? More hopeful. ? Better about yourself. ? More confident about taking care of yourself.  Get support from health experts as well as other people with similar problems.  Learn how to manage and understand your medicines.  Teach your family about your condition and how to participate in your recovery.  What happens in cardiac rehabilitation? You will be assessed by a cardiac rehabilitation team. They will check your health history and do a physical exam. You may need blood tests, stress tests, and other evaluations to make sure that you are ready to start cardiac rehabilitation. The cardiac rehabilitation team works with you to make a plan  based on your health and goals. Your program will be tailored to fit you and your needs and may change as you progress. You may work with a health care team that includes:  Doctors.  Nurses.  Dietitians.  Psychologists.  Exercise specialists.  Physical and occupational therapists.  What are the phases of cardiac rehabilitation? A cardiac rehabilitation program is often divided into phases. You advance from one phase to the next. Phase One This phase starts while you are still in the hospital. You may start by walking in your room and then in the hall. You may start some simple exercises with a therapist. Phase Two This phase begins when you go home or to another facility. This phase may last 8-12 weeks. You will travel to a cardiac rehabilitation center or another place where rehabilitation is offered. You will slowly increase your activity level while being closely watched by a nurse or therapist. Exercises may include a combination of strength or resistance training and cardio or aerobic movement on a treadmill or other machines. Your condition will determine how often and how long these sessions last. In phase two, you may learn how to cook healthy meals, control your blood sugar, and manage your medicines. You may need help with scheduling or planning how and when to take your medicines. If you have questions about your medicines, it is very important that you talk to your health care provider. Phase Three This phase continues for the rest of your life. There will be less supervision. You may still participate in cardiac rehabilitation activities or become part of a group in your community. You may benefit from talking about your experience with other people  who are facing similar challenges. Get help right away if:  You have severe chest discomfort, especially if the pain is crushing or pressure-like and spreads to your arms, back, neck, or jaw. Do not wait to see if the pain will go  away.  You have weakness or numbness in your face, arms, or legs, especially on one side of the body.  Your speech is slurred.  You are confused.  You have a sudden severe headache or loss of vision.  You have shortness of breath.  You are sweating and have nausea.  You feel dizzy or faint.  You are fatigued. These symptoms may represent a serious problem that is an emergency. Do not wait to see if the symptoms will go away. Get medical help right away. Call your local emergency services (911 in the [place] Do not drive yourself to the hospital. This information is not intended to replace advice given to you by your health care provider. Make sure you discuss any questions you have with your health care provider. Document Released: 09/08/2008 Document Revised: 11/16/2016 Document Reviewed: 10/14/2015 Elsevier Interactive Patient Education  2018 [employer].   Sexual Activity and Heart Disease Sexual intimacy is an important part of your well-being. After a heart procedure or damaging heart occurrence (cardiac event), you may be worried about being sexually active. If you or your partner have any worries or questions about sexual activity, be sure to discuss them with your health care provider. Most people can continue to have an active sex life. When can I resume sexual activity? How soon it is safe to resume sexual activity--including sex, masturbation, and oral sex--depends on the type of heart procedure or cardiac event that you had. For example:  If you had a heart attack, you may be able to have sex after 2 weeks.  If you had a complicated cardiac event or heart surgery, you may have to wait up to 8 weeks before resuming sexual activity.  Ask your health care provider when it is safe for you to resume sexual activity. How do I know when I am ready to resume sexual activity? How soon you are ready to resume sexual activity depends on:  Your physical comfort.  Your mental  readiness.  Your sexual habits.  Sexual activity involves at least as much energy as climbing two flights of stairs or walking briskly for 20 minutes. It is okay to have sex if you can do these activities without having any of the following problems:  Discomfort in your chest, neck, or arm (angina).  Shortness of breath.  Excessive tiredness.  To check whether you are ready to resume sexual activity, your health care provider may have you take an exercise test. This test involves using a treadmill or stationary bicycle while your heart and blood pressure is monitored. The test shows how your heart handles activity. What do I need to know about sexual activity after a cardiac event? Medicines  Certain prescription medicines can affect sexual function. They can decrease your desire for sex, decrease vaginal lubrication, make it hard for you to achieve or maintain an erection, or make it impossible to achieve an orgasm. If you have any of these problems while taking a medicine, do not stop taking the medicine. Talk with your health care provider about the problem.  Talk with your health care provider before taking any herbal medicines or vitamins. They can interfere with prescription medicines and your heart function.  If you are thinking about  starting birth control, discuss it with your health care provider first. This is important.  If you take medicine for sexual dysfunction: ? Avoid medicine such as nitroglycerin or long-acting nitrate medicine for 24 hours. Taking a medicine for sexual dysfunction and a nitrate medicine together can cause a serious drop in blood pressure. Intimacy The stress of your surgery or cardiac event can affect intimacy between you and your partner. When you decide to have sex:  Choose a relaxing atmosphere.  Feel rested and relaxed.  Talk openly and honestly with each other.  Be patient with each other.  Start slowly, and gradually increase intimacy. You  can increase intimacy by doing things like caressing, touching, and holding each other.  Follow these instructions at home: Lifestyle  Consider participating in a cardiac rehabilitation program or exercising regularly. This can benefit your sex life by building strength and endurance.  Ask your health care provider what exercises are safe for you.  Eat a healthy diet that includes lots of fruits and vegetables, less red meat, and fewer high-fat dairy products. Sexual activity  Avoid having sex after a heavy meal.  Avoid having too much alcohol before sex.  Ask your partner to take a more active role during sex.  If you have angina during sex and are not on a sexual dysfunction drug, stop having sex and take a nitroglycerin as directed by your health care provider. If the angina goes away, you may resume sexual activity. If your symptoms do not go away within 5-10 minutes, get help right away. Contact a health care provider if:  You are feeling depressed.  You have pain during sex.  You are having difficulty returning to sexual activity. Get help right away if:  You have chest pain or shortness of breath.  You feel dizzy or light-headed during or after sexual activity. Summary  Ask your health care provider when it is safe for you to resume sexual activity.  Consider participating in a cardiac rehabilitation program or exercising regularly. This can benefit your sex life by building strength and endurance.  Start slowly, and gradually increase intimacy. This information is not intended to replace advice given to you by your health care provider. Make sure you discuss any questions you have with your health care provider. Document Released: 09/20/2013 Document Revised: 09/11/2016 Document Reviewed: 09/11/2016 Elsevier Interactive Patient Education  2018 [employer].   Aspirin and Your Heart Aspirin is a medicine that affects the way blood clots. Aspirin can be used to help  reduce the risk of blood clots, heart attacks, and other heart-related problems. Should I take aspirin? Your health care provider will help you determine whether it is safe and beneficial for you to take aspirin daily. Taking aspirin daily may be beneficial if you:  Have had a heart attack or chest pain.  Have undergone open heart surgery such as coronary artery bypass surgery (CABG).  Have had coronary angioplasty.  Have experienced a stroke or transient ischemic attack (TIA).  Have peripheral vascular disease (PVD).  Have chronic heart rhythm problems such as atrial fibrillation.  Are there any risks of taking aspirin daily? Daily use of aspirin can increase your risk of side effects. Some of these include:  Bleeding. Bleeding problems can be minor or serious. An example of a minor problem is a cut that does not stop bleeding. An example of a more serious problem is stomach bleeding or bleeding into the brain. Your risk of bleeding is increased if you  are also taking non-steroidal anti-inflammatory medicine (NSAIDs).  Increased bruising.  Upset stomach.  An allergic reaction. People who have nasal polyps have an increased risk of developing an aspirin allergy.  What are some guidelines I should follow when taking aspirin?  Take aspirin only as directed by your health care provider. Make sure you understand how much you should take and what form you should take. The two forms of aspirin are: ? Non-enteric-coated. This type of aspirin does not have a coating and is absorbed quickly. Non-enteric-coated aspirin is usually recommended for people with chest pain. This type of aspirin also comes in a chewable form. ? Enteric-coated. This type of aspirin has a special coating that releases the medicine very slowly. Enteric-coated aspirin causes less stomach upset than non-enteric-coated aspirin. This type of aspirin should not be chewed or crushed.  Drink alcohol in moderation. Drinking  alcohol increases your risk of bleeding. When should I seek medical care?  You have unusual bleeding or bruising.  You have stomach pain.  You have an allergic reaction. Symptoms of an allergic reaction include: ? Hives. ? Itchy skin. ? Swelling of the lips, tongue, or face.  You have ringing in your ears. When should I seek immediate medical care?  Your bowel movements are bloody, dark red, or black in color.  You vomit or cough up blood.  You have blood in your urine.  You cough, wheeze, or feel short of breath. If you have any of the following symptoms, this is an emergency. Do not wait to see if the pain will go away. Get medical help at once. Call your local emergency services (911 in the [place] Do not drive yourself to the hospital.  You have severe chest pain, especially if the pain is crushing or pressure-like and spreads to the arms, back, neck, or jaw.  You have stroke-like symptoms, such as: ? Loss of vision. ? Difficulty talking. ? Numbness or weakness on one side of your body. ? Numbness or weakness in your arm or leg. ? Not thinking clearly or feeling confused.  This information is not intended to replace advice given to you by your health care provider. Make sure you discuss any questions you have with your health care provider. Document Released: 11/12/2008 Document Revised: 04/08/2016 Document Reviewed: 03/07/2014 Elsevier Interactive Patient Education  2018 [employer].   Acute Coronary Syndrome Acute coronary syndrome (ACS) is a serious problem in which there is suddenly not enough blood and oxygen reaching the heart. ACS can result in chest pain or a heart attack. What are the causes? This condition may be caused by:  A buildup of fat and cholesterol inside of the arteries (atherosclerosis). This is the most common cause. The buildup (plaque) can cause the blood vessels in your heart (coronary arteries) to become narrow or blocked. Plaque can also  break off to form a clot.  A coronary spasm.  A tearing of the coronary artery (spontaneous coronary artery dissection).  Low blood pressure (hypotension).  An abnormal heart beat (arrhythmia).  Using cocaine or methamphetamine.  What increases the risk? The following factors may make you more likely to develop this condition:  Age.  History of chest pain, heart attack, or stroke.  Being male.  Family history of chest pain, heart disease, or stroke.  Smoking.  Inactivity.  Being overweight.  High cholesterol.  High blood pressure (hypertension).  Diabetes.  Excessive alcohol use.  What are the signs or symptoms? Common symptoms of this condition  include:  Chest pain. The pain may last long, or may stop and come back (recur). It may feel like: ? Crushing or squeezing. ? Tightness, pressure, fullness, or heaviness.  Arm, neck, jaw, or back pain.  Heartburn or indigestion.  Shortness of breath.  Nausea.  Sudden cold sweats.  Lightheadedness.  Dizziness.  Tiredness (fatigue).  Sometimes there are no symptoms. How is this diagnosed? This condition may be diagnosed through:  An electrocardiogram (ECG). This test records the impulses of the heart.  Blood tests.  A CT scan of the chest.  A coronary angiogram. This procedure checks for a blockage in the coronary arteries.  How is this treated? Treatment for this condition may include:  Oxygen.  Medicines, such as: ? Antiplatelet medicines and blood-thinning medicines, such as aspirin. These help prevent blood clots. ? Fibrinolytic therapy. This breaks apart a blood clot. ? Blood pressure medicines. ? Nitroglycerin. ? Pain medicine. ? Cholesterol medicine.  A procedure called coronary angioplasty and stenting. This is done to widen a narrowed artery and keep it open.  Coronary artery bypass surgery. This allows blood to pass the blockage to reach your heart.  Cardiac rehabilitation. This  is a program that helps improve your health and well-being. It includes exercise training, education, and counseling to help you recover.  Follow these instructions at home: Eating and drinking  Follow a heart-healthy, low-salt (sodium) diet.  Use healthy cooking methods such as roasting, grilling, broiling, baking, poaching, steaming, or stir-frying.  Talk to a dietitian to learn about healthy cooking methods and how to eat less sodium. Medicines  Take over-the-counter and prescription medicines only as told by your health care provider.  Do not take these medicines unless your health care provider approves: ? Nonsteroidal anti-inflammatory drugs (NSAIDs), such as ibuprofen, naproxen, or celecoxib. ? Vitamin supplements that contain vitamin A or vitamin E. ? Hormone replacement therapy that contains estrogen. Activity  Join a cardiac rehabilitation program.  Ask your health care provider: ? What activities and exercises are safe for you. ? If you should follow specific instructions about lifting, driving, or climbing stairs.  If you are taking aspirin and another blood thinning medicine, avoid activities that are likely to result in an injury. The medicines can increase your risk of bleeding. Lifestyle  Do not use any products that contain nicotine or tobacco, such as cigarettes and e-cigarettes. If you need help quitting, ask your health care provider.  If you drink alcohol and your health care provider says it is okay to drink, limit your alcohol intake to no more than 1 drink per day. One drink equals 12 oz of beer, 5 oz of wine, or 1 oz of hard liquor.  Maintain a healthy weight. If you need to lose weight, do it in a way that has been approved by your health care provider. General instructions  Tell all your health care providers about your heart condition, including your dentist. Some medicines can increase your risk of arrhythmia.  Manage other health conditions, such  as hypertension and diabetes. These conditions affect your heart.  Learn ways to manage stress.  Get screened for depression, and seek treatment if needed.  Monitor your blood pressure if told by your health care provider.  Keep your vaccinations up to date. Get the annual influenza vaccine.  Keep all follow-up visits as told by your health care provider. This is important. Contact a health care provider if:  You feel overwhelmed or sad.  You have trouble with  your daily activities. Get help right away if:  You have pain in your chest, neck, arm, jaw, stomach, or back that recurs, and: ? Lasts more than a few minutes. ? Is not relieved by taking the [place] health care provider prescribed.  You have unexplained: ? Heavy sweating. ? Heartburn or indigestion. ? Shortness of breath. ? Difficulty breathing. ? Nausea or vomiting. ? Fatigue. ? Nervousness or anxiety. ? Weakness. ? Diarrhea. ? Dark stools or blood in the stool.  You have sudden lightheadedness or dizziness.  Your blood pressure is higher than 180/120  You faint.  You feel like hurting yourself or think about taking your own life. These symptoms may represent a serious problem that is an emergency. Do not wait to see if the symptoms will go away. Get medical help right away. Call your local emergency services (911 in the [place] Do not drive yourself to the clinic or hospital. Summary  Acute coronary syndrome (ACS) is a when there is not enough blood and oxygen being supplied to the heart. ACS can result in chest pain or a heart attack.  Acute coronary syndrome is a medical emergency. If you have any symptoms of this condition, get help right away.  Treatment includes oxygen, medicines, and procedures to open the blocked arteries and restore blood flow. This information is not intended to replace advice given to you by your health care provider. Make sure you discuss any questions you have with your  health care provider. Document Released: 11/30/2005 Document Revised: 01/01/2017 Document Reviewed: 01/01/2017 Elsevier Interactive Patient Education  2018 [employer]. Smoking cessation. Compliance to medications.

## 2018-09-10 NOTE — Progress Notes (Signed)
Patient escorted via [facility] and D/C home via private auto.

## 2018-09-10 NOTE — Clinical Social Work Note (Signed)
CSW was consulted for medications, financial issues. Please consult RN CM for these issues to be addressed. Please re-consult should any CSW needs arise. Shela Leff MSW,LCSW [phone]

## 2018-09-10 NOTE — Progress Notes (Signed)
Joshua [name] to be D/C'd Home per MD. Discussed with the patient and all questions answered. Patient offered to talk to MD [facility] and refused.  VSS, skin clean, dry and intact without evidence of skin break down, no evidence of skin tears noted.  IV catheters discontinued intact. Site without signs and symptoms of complications. Dressing and pressure applied.   An after visit summary was printed and given to the patient. Patient received prescriptions.   D/c education refused by patient including follow up instructions, medications list, d/c activities limitations if indicated, with other d/c instructions as indicated by MD.  Patient instructed to return to ED, call 911, or Call MD for any changes in condition.

## 2018-09-10 NOTE — Discharge Summary (Signed)
Between at [place] NAME: Joshua [name]    MR#:  [id number]  DATE OF BIRTH:  [birth year]  DATE OF ADMISSION:  09/09/2018   ADMITTING PHYSICIAN: Yolonda Kida, MD  DATE OF DISCHARGE: 09/10/2018  3:07 PM  PRIMARY CARE PHYSICIAN: Patient, No Pcp Per   ADMISSION DIAGNOSIS:  Acute ST elevation myocardial infarction (STEMI) of anterior wall (HCC) [I21.09] STEMI involving left anterior descending coronary artery (HCC) [I21.02] DISCHARGE DIAGNOSIS:  Active Problems:   Acute ST elevation myocardial infarction (STEMI) involving left anterior descending (LAD) coronary artery (HCC)   STEMI involving left anterior descending coronary artery ([place])  SECONDARY DIAGNOSIS:   Past Medical History:  Diagnosis Date  . Diabetes mellitus without complication [facility])    HOSPITAL COURSE:  47 year old male patient with history of for diabetes mellitus type 2, cocaine abuse, tobacco abuse presented to emergency room with code STEMI  -STEMI Status post cardiac catheterization 100% mid LAD lesion which was stented 100% RCA 75% left circumflex EF 30% Continue aspirin Plavix, statin, Lopressor and lisinopril per Dr. Clayborn Bigness.  -Substance abuse counseling given to the patient  -Tobacco cessation counseled to the patient for 6 minutes Nicotine patch offered  -Type 2 diabetes mellitus Diabetic diet, start metformin 500 mg twice daily after discharge.  I advised the patient be compliance to medications. DISCHARGE CONDITIONS:  Stable, discharge to home today. CONSULTS OBTAINED:  Treatment Team:  Arta Silence, MD DRUG ALLERGIES:  No Known Allergies DISCHARGE MEDICATIONS:   Allergies as of 09/10/2018   No Known Allergies     Medication List    TAKE these medications   aspirin 81 MG EC tablet Take 1 tablet (81 mg total) by mouth daily. Start taking on:  09/11/2018   atorvastatin 80 MG tablet Commonly known as:  LIPITOR Take 1  tablet (80 mg total) by mouth daily at 6 PM.   clopidogrel 75 MG tablet Commonly known as:  PLAVIX Take 1 tablet (75 mg total) by mouth daily with breakfast. Start taking on:  09/11/2018   lisinopril 5 MG tablet Commonly known as:  PRINIVIL,ZESTRIL Take 1 tablet (5 mg total) by mouth daily.   metFORMIN 500 MG tablet Commonly known as:  GLUCOPHAGE Take 1 tablet (500 mg total) by mouth 2 (two) times daily with a meal.   metoprolol succinate 25 MG 24 hr tablet Commonly known as:  TOPROL-XL Take 1 tablet (25 mg total) by mouth daily. Take with or immediately following a meal. Start taking on:  09/11/2018   nitroGLYCERIN 0.4 MG SL tablet Commonly known as:  NITROSTAT Place 1 tablet (0.4 mg total) under the tongue every 5 (five) minutes x 3 doses as needed for chest pain.        DISCHARGE INSTRUCTIONS:  See AVS.  If you experience worsening of your admission symptoms, develop shortness of breath, life threatening emergency, suicidal or homicidal thoughts you must seek medical attention immediately by calling 911 or calling your MD immediately  if symptoms less severe.  You Must read complete instructions/literature along with all the possible adverse reactions/side effects for all the Medicines you take and that have been prescribed to you. Take any new Medicines after you have completely understood and accpet all the possible adverse reactions/side effects.   Please note  You were cared for by a hospitalist during your hospital stay. If you have any questions about your discharge medications or the care you received while you were in the hospital after  you are discharged, you can call the unit and asked to speak with the hospitalist on call if the hospitalist that took care of you is not available. Once you are discharged, your primary care physician will handle any further medical issues. Please note that NO REFILLS for any discharge medications will be authorized once you are  discharged, as it is imperative that you return to your primary care physician (or establish a relationship with a primary care physician if you do not have one) for your aftercare needs so that they can reassess your need for medications and monitor your lab values.    On the day of Discharge:  VITAL SIGNS:  Blood pressure 106/73, pulse 81, temperature 98.3 F (36.8 C), temperature source Oral, resp. rate (!) 22, height 5\' 10"  (1.778 m), weight 79 kg, SpO2 99 %. PHYSICAL EXAMINATION:  GENERAL:  47 y.o.-year-old patient lying in the bed with no acute distress.  EYES: Pupils equal, round, reactive to light and accommodation. No scleral icterus. Extraocular muscles intact.  HEENT: Head atraumatic, normocephalic. Oropharynx and nasopharynx clear.  NECK:  Supple, no jugular venous distention. No thyroid enlargement, no tenderness.  LUNGS: Normal breath sounds bilaterally, no wheezing, rales,rhonchi or crepitation. No use of accessory muscles of respiration.  CARDIOVASCULAR: S1, S2 normal. No murmurs, rubs, or gallops.  ABDOMEN: Soft, non-tender, non-distended. Bowel sounds present. No organomegaly or mass.  EXTREMITIES: No pedal edema, cyanosis, or clubbing.  NEUROLOGIC: Cranial nerves II through XII are intact. Muscle strength 5/5 in all extremities. Sensation intact. Gait not checked.  PSYCHIATRIC: The patient is alert and oriented x 3.  SKIN: No obvious rash, lesion, or ulcer.  DATA REVIEW:   CBC Recent Labs  Lab 09/10/18 0311  WBC 9.7  HGB 13.4  HCT 39.5*  PLT 227    Chemistries  Recent Labs  Lab 09/10/18 0311  NA 137  K 4.4  CL 105  CO2 26  GLUCOSE 259*  BUN 12  CREATININE 1.03  CALCIUM 8.0*  MG 2.2     Microbiology Results  No results found for this or any previous visit.  RADIOLOGY:  No results found.   Management plans discussed with the patient, family and they are in agreement.  CODE STATUS: Full Code   TOTAL TIME TAKING CARE OF THIS PATIENT: 36  minutes.    Demetrios Loll M.D on 09/10/2018 at 3:12 PM  Between 7am to 6pm - Pager - [phone]  After 6pm go to [email or web address] - Proofreader  [facility]  Office  [phone]  CC: Primary care physician; Patient, No Pcp Per   Note: This dictation was prepared with Dragon dictation along with smaller phrase technology. Any transcriptional errors that result from this process are unintentional.

## 2018-09-10 NOTE — Progress Notes (Signed)
Patient states issue with being able to get medications related to finances. Patient also states wanting to return to work on Monday with or with out doctors permission.

## 2018-09-12 ENCOUNTER — Ambulatory Visit: Admit: 2018-09-12 | Discharge: 2018-09-15 | Payer: MEDICAID

## 2018-09-12 DIAGNOSIS — R079 Chest pain, unspecified: Principal | ICD-10-CM

## 2018-09-14 DIAGNOSIS — R079 Chest pain, unspecified: Principal | ICD-10-CM

## 2018-09-15 MED ORDER — METFORMIN 500 MG TABLET
ORAL_TABLET | Freq: Two times a day (BID) | ORAL | 3 refills | 0 days | Status: CP
Start: 2018-09-15 — End: 2018-10-13
  Filled 2018-09-15: qty 28, 14d supply, fill #0

## 2018-09-15 MED ORDER — ASPIRIN 81 MG TABLET,DELAYED RELEASE
ORAL_TABLET | Freq: Every day | ORAL | 11 refills | 0 days | Status: CP
Start: 2018-09-15 — End: 2018-10-13
  Filled 2018-09-15: qty 14, 14d supply, fill #0

## 2018-09-15 MED ORDER — ATORVASTATIN 80 MG TABLET
ORAL_TABLET | Freq: Every day | ORAL | 0 refills | 0.00000 days | Status: CP
Start: 2018-09-15 — End: 2018-09-28
  Filled 2018-09-15: qty 14, 14d supply, fill #0

## 2018-09-15 MED ORDER — METOPROLOL SUCCINATE ER 25 MG TABLET,EXTENDED RELEASE 24 HR
ORAL_TABLET | Freq: Every day | ORAL | 11 refills | 0 days | Status: CP
Start: 2018-09-15 — End: 2018-09-28
  Filled 2018-09-15: qty 14, 14d supply, fill #0

## 2018-09-15 MED ORDER — LISINOPRIL 5 MG TABLET
ORAL_TABLET | Freq: Every day | ORAL | 11 refills | 0 days | Status: CP
Start: 2018-09-15 — End: 2018-10-13
  Filled 2018-09-15: qty 14, 14d supply, fill #0

## 2018-09-15 MED ORDER — CLOPIDOGREL 75 MG TABLET
ORAL_TABLET | Freq: Every day | ORAL | 11 refills | 0 days | Status: CP
Start: 2018-09-15 — End: 2018-10-13
  Filled 2018-09-15: qty 14, 14d supply, fill #0

## 2018-09-15 MED FILL — ASPIRIN 81 MG TABLET,DELAYED RELEASE: 14 days supply | Qty: 14 | Fill #0 | Status: AC

## 2018-09-15 MED FILL — SPIRONOLACTONE 25 MG TABLET: 14 days supply | Qty: 7 | Fill #0 | Status: AC

## 2018-09-15 MED FILL — ATORVASTATIN 80 MG TABLET: 14 days supply | Qty: 14 | Fill #0 | Status: AC

## 2018-09-15 MED FILL — CLOPIDOGREL 75 MG TABLET: 14 days supply | Qty: 14 | Fill #0 | Status: AC

## 2018-09-15 MED FILL — METOPROLOL SUCCINATE ER 25 MG TABLET,EXTENDED RELEASE 24 HR: 14 days supply | Qty: 14 | Fill #0 | Status: AC

## 2018-09-15 MED FILL — METFORMIN 500 MG TABLET: 14 days supply | Qty: 28 | Fill #0 | Status: AC

## 2018-09-15 MED FILL — LISINOPRIL 5 MG TABLET: 14 days supply | Qty: 14 | Fill #0 | Status: AC

## 2018-09-16 MED ORDER — SPIRONOLACTONE 25 MG TABLET
ORAL_TABLET | Freq: Every day | ORAL | 2 refills | 0.00000 days | Status: CP
Start: 2018-09-16 — End: 2018-10-13
  Filled 2018-09-15: qty 7, 14d supply, fill #0
  Filled 2018-09-27: qty 15, 30d supply, fill #0

## 2018-09-22 ENCOUNTER — Ambulatory Visit
Admit: 2018-09-22 | Discharge: 2018-09-23 | Payer: MEDICAID | Attending: Cardiovascular Disease | Primary: Cardiovascular Disease

## 2018-09-22 DIAGNOSIS — I5022 Chronic systolic (congestive) heart failure: Principal | ICD-10-CM

## 2018-09-22 DIAGNOSIS — I252 Old myocardial infarction: Secondary | ICD-10-CM

## 2018-09-22 DIAGNOSIS — F1910 Other psychoactive substance abuse, uncomplicated: Secondary | ICD-10-CM

## 2018-09-22 DIAGNOSIS — E119 Type 2 diabetes mellitus without complications: Secondary | ICD-10-CM

## 2018-09-27 MED FILL — METFORMIN 500 MG TABLET: ORAL | 30 days supply | Qty: 60 | Fill #0

## 2018-09-27 MED FILL — METOPROLOL SUCCINATE ER 25 MG TABLET,EXTENDED RELEASE 24 HR: ORAL | 22 days supply | Qty: 22 | Fill #0

## 2018-09-27 MED FILL — METFORMIN 500 MG TABLET: 30 days supply | Qty: 60 | Fill #0 | Status: AC

## 2018-09-27 MED FILL — ASPIRIN 81 MG TABLET,DELAYED RELEASE: ORAL | 30 days supply | Qty: 30 | Fill #0

## 2018-09-27 MED FILL — LISINOPRIL 5 MG TABLET: 30 days supply | Qty: 30 | Fill #0 | Status: AC

## 2018-09-27 MED FILL — CLOPIDOGREL 75 MG TABLET: 30 days supply | Qty: 30 | Fill #0 | Status: AC

## 2018-09-27 MED FILL — ASPIRIN 81 MG TABLET,DELAYED RELEASE: 30 days supply | Qty: 30 | Fill #0 | Status: AC

## 2018-09-27 MED FILL — CLOPIDOGREL 75 MG TABLET: ORAL | 30 days supply | Qty: 30 | Fill #0

## 2018-09-27 MED FILL — LISINOPRIL 5 MG TABLET: ORAL | 30 days supply | Qty: 30 | Fill #0

## 2018-09-27 MED FILL — SPIRONOLACTONE 25 MG TABLET: 30 days supply | Qty: 15 | Fill #0 | Status: AC

## 2018-09-27 MED FILL — ATORVASTATIN 80 MG TABLET: 16 days supply | Qty: 16 | Fill #0 | Status: AC

## 2018-09-27 MED FILL — METOPROLOL SUCCINATE ER 25 MG TABLET,EXTENDED RELEASE 24 HR: 22 days supply | Qty: 22 | Fill #0 | Status: AC

## 2018-09-28 ENCOUNTER — Ambulatory Visit: Admit: 2018-09-28 | Discharge: 2018-09-29

## 2018-09-28 DIAGNOSIS — I252 Old myocardial infarction: Secondary | ICD-10-CM

## 2018-09-28 DIAGNOSIS — I5022 Chronic systolic (congestive) heart failure: Principal | ICD-10-CM

## 2018-09-28 DIAGNOSIS — E119 Type 2 diabetes mellitus without complications: Secondary | ICD-10-CM

## 2018-09-28 DIAGNOSIS — Z09 Encounter for follow-up examination after completed treatment for conditions other than malignant neoplasm: Secondary | ICD-10-CM

## 2018-09-28 MED ORDER — METOPROLOL SUCCINATE ER 25 MG TABLET,EXTENDED RELEASE 24 HR
ORAL_TABLET | Freq: Every day | ORAL | 11 refills | 0.00000 days | Status: SS
Start: 2018-09-28 — End: 2018-10-07

## 2018-09-28 MED ORDER — ATORVASTATIN 80 MG TABLET
ORAL_TABLET | Freq: Every day | ORAL | 11 refills | 0.00000 days | Status: CP
Start: 2018-09-28 — End: 2018-10-13
  Filled 2018-09-27: qty 16, 16d supply, fill #0

## 2018-09-28 MED ORDER — NITROGLYCERIN 0.4 MG SUBLINGUAL TABLET
SUBLINGUAL | 3 refills | 0.00000 days | Status: CP | PRN
Start: 2018-09-28 — End: ?
  Filled 2018-09-28: qty 25, 25d supply, fill #0

## 2018-09-28 MED FILL — NITROGLYCERIN 0.4 MG SUBLINGUAL TABLET: 25 days supply | Qty: 25 | Fill #0 | Status: AC

## 2018-10-06 ENCOUNTER — Ambulatory Visit: Admit: 2018-10-06 | Discharge: 2018-10-07 | Payer: MEDICAID

## 2018-10-06 ENCOUNTER — Encounter: Admit: 2018-10-06 | Discharge: 2018-10-07 | Payer: MEDICAID

## 2018-10-06 DIAGNOSIS — R0789 Other chest pain: Principal | ICD-10-CM

## 2018-10-07 MED ORDER — METOPROLOL SUCCINATE ER 25 MG TABLET,EXTENDED RELEASE 24 HR
ORAL_TABLET | Freq: Every day | ORAL | 0 refills | 0 days | Status: CP
Start: 2018-10-07 — End: 2018-10-13
  Filled 2018-10-07: qty 60, 30d supply, fill #0

## 2018-10-07 MED ORDER — FUROSEMIDE 20 MG TABLET
ORAL_TABLET | Freq: Every day | ORAL | 0 refills | 0.00000 days | Status: CP | PRN
Start: 2018-10-07 — End: 2018-10-13
  Filled 2018-10-07: qty 30, 30d supply, fill #0

## 2018-10-07 MED FILL — METOPROLOL SUCCINATE ER 25 MG TABLET,EXTENDED RELEASE 24 HR: 30 days supply | Qty: 60 | Fill #0 | Status: AC

## 2018-10-07 MED FILL — FUROSEMIDE 20 MG TABLET: 30 days supply | Qty: 30 | Fill #0 | Status: AC

## 2018-10-13 ENCOUNTER — Ambulatory Visit
Admit: 2018-10-13 | Discharge: 2018-10-14 | Payer: MEDICAID | Attending: Cardiovascular Disease | Primary: Cardiovascular Disease

## 2018-10-13 DIAGNOSIS — I5022 Chronic systolic (congestive) heart failure: Principal | ICD-10-CM

## 2018-10-13 MED ORDER — FUROSEMIDE 20 MG TABLET
ORAL_TABLET | Freq: Every day | ORAL | 3 refills | 0 days | Status: CP | PRN
Start: 2018-10-13 — End: 2018-11-16
  Filled 2018-10-31: qty 90, 90d supply, fill #0

## 2018-10-13 MED ORDER — CLOPIDOGREL 75 MG TABLET
ORAL_TABLET | Freq: Every day | ORAL | 11 refills | 0 days | Status: CP
Start: 2018-10-13 — End: 2019-02-14
  Filled 2018-10-21: qty 90, 90d supply, fill #0

## 2018-10-13 MED ORDER — LISINOPRIL 5 MG TABLET
ORAL_TABLET | Freq: Every day | ORAL | 11 refills | 0 days | Status: CP
Start: 2018-10-13 — End: 2019-02-14
  Filled 2018-10-24: qty 90, 90d supply, fill #0

## 2018-10-13 MED ORDER — ASPIRIN 81 MG TABLET,DELAYED RELEASE
ORAL_TABLET | Freq: Every day | ORAL | 11 refills | 0.00000 days | Status: CP
Start: 2018-10-13 — End: 2019-02-14
  Filled 2018-10-21: qty 90, 90d supply, fill #0

## 2018-10-13 MED ORDER — METOPROLOL SUCCINATE ER 100 MG TABLET,EXTENDED RELEASE 24 HR
ORAL_TABLET | Freq: Every day | ORAL | 6 refills | 0.00000 days | Status: CP
Start: 2018-10-13 — End: 2019-02-14
  Filled 2018-10-13: qty 30, 30d supply, fill #0

## 2018-10-13 MED ORDER — METFORMIN 500 MG TABLET
ORAL_TABLET | Freq: Two times a day (BID) | ORAL | 3 refills | 0 days | Status: CP
Start: 2018-10-13 — End: 2018-11-16
  Filled 2018-11-03: qty 180, 90d supply, fill #0

## 2018-10-13 MED ORDER — SPIRONOLACTONE 25 MG TABLET
ORAL_TABLET | Freq: Every day | ORAL | 3 refills | 0 days | Status: CP
Start: 2018-10-13 — End: 2018-10-17

## 2018-10-13 MED ORDER — ATORVASTATIN 80 MG TABLET
ORAL_TABLET | Freq: Every day | ORAL | 11 refills | 0 days | Status: CP
Start: 2018-10-13 — End: 2019-02-14
  Filled 2018-10-19: qty 90, 90d supply, fill #0

## 2018-10-13 MED FILL — METOPROLOL SUCCINATE ER 100 MG TABLET,EXTENDED RELEASE 24 HR: 30 days supply | Qty: 30 | Fill #0 | Status: AC

## 2018-10-17 ENCOUNTER — Ambulatory Visit: Admit: 2018-10-17 | Discharge: 2018-10-18

## 2018-10-17 DIAGNOSIS — I252 Old myocardial infarction: Secondary | ICD-10-CM

## 2018-10-17 DIAGNOSIS — G4700 Insomnia, unspecified: Secondary | ICD-10-CM

## 2018-10-17 DIAGNOSIS — E119 Type 2 diabetes mellitus without complications: Secondary | ICD-10-CM

## 2018-10-17 DIAGNOSIS — Z09 Encounter for follow-up examination after completed treatment for conditions other than malignant neoplasm: Principal | ICD-10-CM

## 2018-10-17 DIAGNOSIS — M79604 Pain in right leg: Secondary | ICD-10-CM

## 2018-10-17 MED ORDER — TRAZODONE 50 MG TABLET
ORAL_TABLET | Freq: Every evening | ORAL | 3 refills | 0 days | Status: CP
Start: 2018-10-17 — End: 2019-02-14
  Filled 2018-10-18: qty 30, 30d supply, fill #0

## 2018-10-17 MED ORDER — SPIRONOLACTONE 25 MG TABLET
ORAL_TABLET | Freq: Every day | ORAL | 3 refills | 0 days | Status: CP
Start: 2018-10-17 — End: 2019-02-14
  Filled 2018-10-21: qty 90, 90d supply, fill #0

## 2018-10-18 MED FILL — TRAZODONE 50 MG TABLET: 30 days supply | Qty: 30 | Fill #0 | Status: AC

## 2018-10-19 MED FILL — NITROGLYCERIN 0.4 MG SUBLINGUAL TABLET: 7 days supply | Qty: 25 | Fill #0 | Status: AC

## 2018-10-19 MED FILL — ATORVASTATIN 80 MG TABLET: 90 days supply | Qty: 90 | Fill #0 | Status: AC

## 2018-10-19 MED FILL — NITROGLYCERIN 0.4 MG SUBLINGUAL TABLET: SUBLINGUAL | 7 days supply | Qty: 25 | Fill #0

## 2018-10-21 MED FILL — SPIRONOLACTONE 25 MG TABLET: 90 days supply | Qty: 90 | Fill #0 | Status: AC

## 2018-10-21 MED FILL — CLOPIDOGREL 75 MG TABLET: 90 days supply | Qty: 90 | Fill #0 | Status: AC

## 2018-10-21 MED FILL — ASPIRIN 81 MG TABLET,DELAYED RELEASE: 90 days supply | Qty: 90 | Fill #0 | Status: AC

## 2018-10-24 MED FILL — LISINOPRIL 5 MG TABLET: 90 days supply | Qty: 90 | Fill #0 | Status: AC

## 2018-10-31 MED FILL — FUROSEMIDE 20 MG TABLET: 90 days supply | Qty: 90 | Fill #0 | Status: AC

## 2018-11-03 MED FILL — METFORMIN 500 MG TABLET: 90 days supply | Qty: 180 | Fill #0 | Status: AC

## 2018-11-07 MED FILL — METOPROLOL SUCCINATE ER 100 MG TABLET,EXTENDED RELEASE 24 HR: 90 days supply | Qty: 90 | Fill #0 | Status: AC

## 2018-11-07 MED FILL — METOPROLOL SUCCINATE ER 100 MG TABLET,EXTENDED RELEASE 24 HR: ORAL | 90 days supply | Qty: 90 | Fill #0

## 2018-11-16 ENCOUNTER — Ambulatory Visit
Admit: 2018-11-16 | Discharge: 2018-11-16 | Attending: Student in an Organized Health Care Education/Training Program | Primary: Student in an Organized Health Care Education/Training Program

## 2018-11-16 ENCOUNTER — Ambulatory Visit: Admit: 2018-11-16 | Discharge: 2018-11-16 | Attending: Adult Health | Primary: Adult Health

## 2018-11-16 ENCOUNTER — Ambulatory Visit: Admit: 2018-11-16 | Discharge: 2018-11-16 | Attending: Internal Medicine | Primary: Internal Medicine

## 2018-11-16 ENCOUNTER — Ambulatory Visit: Admit: 2018-11-16 | Discharge: 2018-11-16

## 2018-11-16 ENCOUNTER — Other Ambulatory Visit: Admit: 2018-11-16 | Discharge: 2018-11-16

## 2018-11-16 DIAGNOSIS — F1910 Other psychoactive substance abuse, uncomplicated: Secondary | ICD-10-CM

## 2018-11-16 DIAGNOSIS — E119 Type 2 diabetes mellitus without complications: Principal | ICD-10-CM

## 2018-11-16 DIAGNOSIS — I5022 Chronic systolic (congestive) heart failure: Secondary | ICD-10-CM

## 2018-11-16 DIAGNOSIS — I253 Aneurysm of heart: Secondary | ICD-10-CM

## 2018-11-16 DIAGNOSIS — Z720 Tobacco use: Secondary | ICD-10-CM

## 2018-11-16 DIAGNOSIS — I252 Old myocardial infarction: Secondary | ICD-10-CM

## 2018-11-16 MED ORDER — DOCUSATE SODIUM 100 MG CAPSULE
ORAL_CAPSULE | Freq: Every day | ORAL | 11 refills | 0.00000 days | Status: CP
Start: 2018-11-16 — End: 2019-02-14

## 2018-11-16 MED ORDER — METFORMIN 500 MG TABLET
ORAL_TABLET | Freq: Two times a day (BID) | ORAL | 3 refills | 0.00000 days | Status: CP
Start: 2018-11-16 — End: 2018-11-16

## 2018-11-16 MED ORDER — METFORMIN 500 MG TABLET: 500 mg | tablet | Freq: Two times a day (BID) | 3 refills | 0 days | Status: AC

## 2018-11-16 MED ORDER — DOCUSATE SODIUM 100 MG CAPSULE: 100 mg | capsule | Freq: Every day | 11 refills | 0 days | Status: AC

## 2018-11-16 MED ORDER — FUROSEMIDE 20 MG TABLET
ORAL_TABLET | Freq: Every day | ORAL | 11 refills | 0 days | Status: CP
Start: 2018-11-16 — End: 2019-02-14

## 2018-11-29 ENCOUNTER — Ambulatory Visit
Admit: 2018-11-29 | Discharge: 2018-11-30 | Attending: Student in an Organized Health Care Education/Training Program | Primary: Student in an Organized Health Care Education/Training Program

## 2018-11-29 DIAGNOSIS — Z131 Encounter for screening for diabetes mellitus: Secondary | ICD-10-CM

## 2018-11-29 DIAGNOSIS — I253 Aneurysm of heart: Secondary | ICD-10-CM

## 2018-11-29 DIAGNOSIS — K5900 Constipation, unspecified: Secondary | ICD-10-CM

## 2018-11-29 DIAGNOSIS — E119 Type 2 diabetes mellitus without complications: Secondary | ICD-10-CM

## 2018-11-29 DIAGNOSIS — I5022 Chronic systolic (congestive) heart failure: Secondary | ICD-10-CM

## 2018-11-29 DIAGNOSIS — Z23 Encounter for immunization: Principal | ICD-10-CM

## 2018-11-29 DIAGNOSIS — F1910 Other psychoactive substance abuse, uncomplicated: Secondary | ICD-10-CM

## 2018-11-29 DIAGNOSIS — Z720 Tobacco use: Secondary | ICD-10-CM

## 2018-11-29 MED ORDER — PSYLLIUM HUSK 0.52 GRAM CAPSULE
ORAL_CAPSULE | Freq: Every day | ORAL | 11 refills | 0 days | Status: CP
Start: 2018-11-29 — End: 2019-03-09

## 2018-11-29 MED ORDER — METFORMIN 500 MG TABLET
ORAL_TABLET | 3 refills | 0 days | Status: CP
Start: 2018-11-29 — End: 2019-02-14
  Filled 2018-11-29: qty 60, 20d supply, fill #0

## 2018-11-29 MED FILL — METFORMIN 500 MG TABLET: 20 days supply | Qty: 60 | Fill #0 | Status: AC

## 2019-01-03 ENCOUNTER — Ambulatory Visit: Admit: 2019-01-03 | Discharge: 2019-01-03 | Payer: MEDICAID

## 2019-01-03 DIAGNOSIS — I5022 Chronic systolic (congestive) heart failure: Secondary | ICD-10-CM

## 2019-01-03 DIAGNOSIS — J069 Acute upper respiratory infection, unspecified: Principal | ICD-10-CM

## 2019-01-03 MED ORDER — CETIRIZINE 5 MG TABLET
ORAL_TABLET | Freq: Two times a day (BID) | ORAL | 0 refills | 0.00000 days | Status: CP | PRN
Start: 2019-01-03 — End: 2019-02-14

## 2019-01-03 MED ORDER — ACETAMINOPHEN 500 MG TABLET
ORAL_TABLET | Freq: Four times a day (QID) | ORAL | 1 refills | 0 days | Status: CP | PRN
Start: 2019-01-03 — End: 2019-02-02
  Filled 2019-01-03: qty 120, 30d supply, fill #0

## 2019-01-03 MED FILL — ACETAMINOPHEN 500 MG TABLET: 30 days supply | Qty: 120 | Fill #0 | Status: AC

## 2019-01-05 ENCOUNTER — Ambulatory Visit
Admit: 2019-01-05 | Discharge: 2019-01-06 | Payer: MEDICAID | Attending: Cardiovascular Disease | Primary: Cardiovascular Disease

## 2019-01-05 DIAGNOSIS — I509 Heart failure, unspecified: Principal | ICD-10-CM

## 2019-01-05 MED ORDER — CITALOPRAM 20 MG TABLET
ORAL_TABLET | Freq: Every day | ORAL | 3 refills | 0.00000 days | Status: CP
Start: 2019-01-05 — End: 2019-02-14

## 2019-02-14 ENCOUNTER — Ambulatory Visit: Admit: 2019-02-14 | Discharge: 2019-02-14 | Payer: MEDICAID

## 2019-02-14 DIAGNOSIS — I219 Acute myocardial infarction, unspecified: Principal | ICD-10-CM

## 2019-02-14 DIAGNOSIS — I5022 Chronic systolic (congestive) heart failure: Principal | ICD-10-CM

## 2019-02-14 MED ORDER — FUROSEMIDE 20 MG TABLET
ORAL_TABLET | Freq: Every day | ORAL | 11 refills | 0 days | Status: CP
Start: 2019-02-14 — End: 2020-02-14

## 2019-02-14 MED ORDER — SPIRONOLACTONE 25 MG TABLET
ORAL_TABLET | Freq: Every day | ORAL | 3 refills | 0 days | Status: CP
Start: 2019-02-14 — End: ?

## 2019-02-14 MED ORDER — CITALOPRAM 20 MG TABLET
ORAL_TABLET | Freq: Every day | ORAL | 3 refills | 0.00000 days | Status: CP
Start: 2019-02-14 — End: 2019-03-06

## 2019-02-14 MED ORDER — METFORMIN 500 MG TABLET
ORAL_TABLET | Freq: Two times a day (BID) | ORAL | 3 refills | 0 days | Status: CP
Start: 2019-02-14 — End: ?

## 2019-02-14 MED ORDER — LISINOPRIL 5 MG TABLET
ORAL_TABLET | Freq: Every day | ORAL | 11 refills | 0.00000 days | Status: CP
Start: 2019-02-14 — End: 2019-03-09

## 2019-02-14 MED ORDER — ATORVASTATIN 80 MG TABLET
ORAL_TABLET | Freq: Every day | ORAL | 11 refills | 0 days | Status: CP
Start: 2019-02-14 — End: 2019-04-06

## 2019-02-14 MED ORDER — METOPROLOL SUCCINATE ER 100 MG TABLET,EXTENDED RELEASE 24 HR
ORAL_TABLET | Freq: Every day | ORAL | 6 refills | 0.00000 days | Status: CP
Start: 2019-02-14 — End: 2019-04-06

## 2019-02-14 MED ORDER — ASPIRIN 81 MG TABLET,DELAYED RELEASE
ORAL_TABLET | Freq: Every day | ORAL | 11 refills | 0 days | Status: CP
Start: 2019-02-14 — End: ?

## 2019-03-06 MED ORDER — VENLAFAXINE ER 75 MG CAPSULE,EXTENDED RELEASE 24 HR
ORAL_CAPSULE | Freq: Every day | ORAL | 3 refills | 0.00000 days | Status: CP
Start: 2019-03-06 — End: 2020-03-05

## 2019-03-09 ENCOUNTER — Institutional Professional Consult (permissible substitution): Admit: 2019-03-09 | Discharge: 2019-03-10 | Payer: MEDICAID

## 2019-03-09 DIAGNOSIS — I509 Heart failure, unspecified: Principal | ICD-10-CM

## 2019-03-12 MED ORDER — ENTRESTO 24 MG-26 MG TABLET
ORAL_TABLET | Freq: Two times a day (BID) | ORAL | 1 refills | 0.00000 days | Status: CP
Start: 2019-03-12 — End: ?
  Filled 2019-04-03: qty 60, 30d supply, fill #0

## 2019-04-03 ENCOUNTER — Ambulatory Visit: Admit: 2019-04-03 | Discharge: 2019-04-04 | Payer: MEDICAID

## 2019-04-03 MED FILL — ENTRESTO 24 MG-26 MG TABLET: 30 days supply | Qty: 60 | Fill #0 | Status: AC

## 2019-04-06 ENCOUNTER — Institutional Professional Consult (permissible substitution)
Admit: 2019-04-06 | Discharge: 2019-04-07 | Payer: MEDICAID | Attending: Cardiovascular Disease | Primary: Cardiovascular Disease

## 2019-04-06 DIAGNOSIS — I252 Old myocardial infarction: Secondary | ICD-10-CM

## 2019-04-06 DIAGNOSIS — Z720 Tobacco use: Secondary | ICD-10-CM

## 2019-04-06 DIAGNOSIS — F1910 Other psychoactive substance abuse, uncomplicated: Secondary | ICD-10-CM

## 2019-04-06 DIAGNOSIS — I5023 Acute on chronic systolic (congestive) heart failure: Principal | ICD-10-CM

## 2019-04-24 ENCOUNTER — Institutional Professional Consult (permissible substitution): Admit: 2019-04-24 | Discharge: 2019-04-25 | Payer: MEDICAID

## 2020-07-15 ENCOUNTER — Other Ambulatory Visit: Payer: Self-pay

## 2020-07-15 ENCOUNTER — Inpatient Hospital Stay
Admission: EM | Admit: 2020-07-15 | Discharge: 2020-07-17 | DRG: 291 | Discharge status: Against Medical Advice | Payer: Medicaid Other | Attending: Internal Medicine | Admitting: Internal Medicine

## 2020-07-15 ENCOUNTER — Encounter: Payer: Self-pay | Admitting: *Deleted

## 2020-07-15 ENCOUNTER — Emergency Department: Payer: Medicaid Other

## 2020-07-15 DIAGNOSIS — D649 Anemia, unspecified: Secondary | ICD-10-CM | POA: Diagnosis present

## 2020-07-15 DIAGNOSIS — Z7982 Long term (current) use of aspirin: Secondary | ICD-10-CM

## 2020-07-15 DIAGNOSIS — E119 Type 2 diabetes mellitus without complications: Secondary | ICD-10-CM | POA: Diagnosis present

## 2020-07-15 DIAGNOSIS — I2510 Atherosclerotic heart disease of native coronary artery without angina pectoris: Secondary | ICD-10-CM | POA: Diagnosis present

## 2020-07-15 DIAGNOSIS — Z20822 Contact with and (suspected) exposure to covid-19: Secondary | ICD-10-CM | POA: Diagnosis present

## 2020-07-15 DIAGNOSIS — Z7984 Long term (current) use of oral hypoglycemic drugs: Secondary | ICD-10-CM

## 2020-07-15 DIAGNOSIS — J188 Other pneumonia, unspecified organism: Secondary | ICD-10-CM

## 2020-07-15 DIAGNOSIS — J9600 Acute respiratory failure, unspecified whether with hypoxia or hypercapnia: Secondary | ICD-10-CM | POA: Diagnosis not present

## 2020-07-15 DIAGNOSIS — I252 Old myocardial infarction: Secondary | ICD-10-CM | POA: Diagnosis not present

## 2020-07-15 DIAGNOSIS — J129 Viral pneumonia, unspecified: Secondary | ICD-10-CM | POA: Diagnosis present

## 2020-07-15 DIAGNOSIS — J159 Unspecified bacterial pneumonia: Secondary | ICD-10-CM | POA: Diagnosis present

## 2020-07-15 DIAGNOSIS — Z5329 Procedure and treatment not carried out because of patient's decision for other reasons: Secondary | ICD-10-CM | POA: Diagnosis not present

## 2020-07-15 DIAGNOSIS — Z7902 Long term (current) use of antithrombotics/antiplatelets: Secondary | ICD-10-CM

## 2020-07-15 DIAGNOSIS — I5023 Acute on chronic systolic (congestive) heart failure: Secondary | ICD-10-CM | POA: Diagnosis not present

## 2020-07-15 DIAGNOSIS — Z955 Presence of coronary angioplasty implant and graft: Secondary | ICD-10-CM | POA: Diagnosis not present

## 2020-07-15 DIAGNOSIS — J9601 Acute respiratory failure with hypoxia: Secondary | ICD-10-CM | POA: Diagnosis present

## 2020-07-15 DIAGNOSIS — R0902 Hypoxemia: Secondary | ICD-10-CM

## 2020-07-15 DIAGNOSIS — J189 Pneumonia, unspecified organism: Secondary | ICD-10-CM

## 2020-07-15 DIAGNOSIS — Z79899 Other long term (current) drug therapy: Secondary | ICD-10-CM

## 2020-07-15 LAB — BASIC METABOLIC PANEL
Anion gap: 6 (ref 5–15)
BUN: 12 mg/dL (ref 6–20)
CO2: 23 mmol/L (ref 22–32)
Calcium: 8.3 mg/dL — ABNORMAL LOW (ref 8.9–10.3)
Chloride: 103 mmol/L (ref 98–111)
Creatinine, Ser: 1.14 mg/dL (ref 0.61–1.24)
GFR calc Af Amer: >60 mL/min (ref >60)
GFR calc non Af Amer: >60 mL/min (ref >60)
Glucose, Bld: 290 mg/dL — ABNORMAL HIGH (ref 70–99)
Potassium: 4.8 mmol/L (ref 3.5–5.1)
Sodium: 132 mmol/L — ABNORMAL LOW (ref 135–145)

## 2020-07-15 LAB — CBC
HCT: 27.5 % — ABNORMAL LOW (ref 39.0–52.0)
Hemoglobin: 8.6 g/dL — ABNORMAL LOW (ref 13.0–17.0)
MCH: 24.5 pg — ABNORMAL LOW (ref 26.0–34.0)
MCHC: 31.3 g/dL (ref 30.0–36.0)
MCV: 78.3 fL — ABNORMAL LOW (ref 80.0–100.0)
Platelets: 294 10*3/uL (ref 150–400)
RBC: 3.51 MIL/uL — ABNORMAL LOW (ref 4.22–5.81)
RDW: 15.6 % — ABNORMAL HIGH (ref 11.5–15.5)
WBC: 8.4 10*3/uL (ref 4.0–10.5)
nRBC: 0 % (ref 0.0–0.2)

## 2020-07-15 LAB — SARS CORONAVIRUS 2 BY RT PCR (HOSPITAL ORDER, PERFORMED IN ~~LOC~~ HOSPITAL LAB): SARS Coronavirus 2: NEGATIVE

## 2020-07-15 LAB — FIBRIN DERIVATIVES D-DIMER (ARMC ONLY): Fibrin derivatives D-dimer (ARMC): 477.51 ng/mL (FEU) (ref 0.00–499.00)

## 2020-07-15 LAB — PROCALCITONIN: Procalcitonin: <0.1 ng/mL

## 2020-07-15 LAB — TROPONIN I (HIGH SENSITIVITY): Troponin I (High Sensitivity): 6 ng/L (ref <18)

## 2020-07-15 IMAGING — CR DG CHEST 2V
1 series · 2 of 2 positions shown · non-contrast
Comparison: [DATE]

CLINICAL DATA: Chest pain

EXAM:
CHEST - 2 VIEW

[Series 1: dg chest 2 view · 0.14mm/px · 2 of 2 slices shown]
[im 1/2]
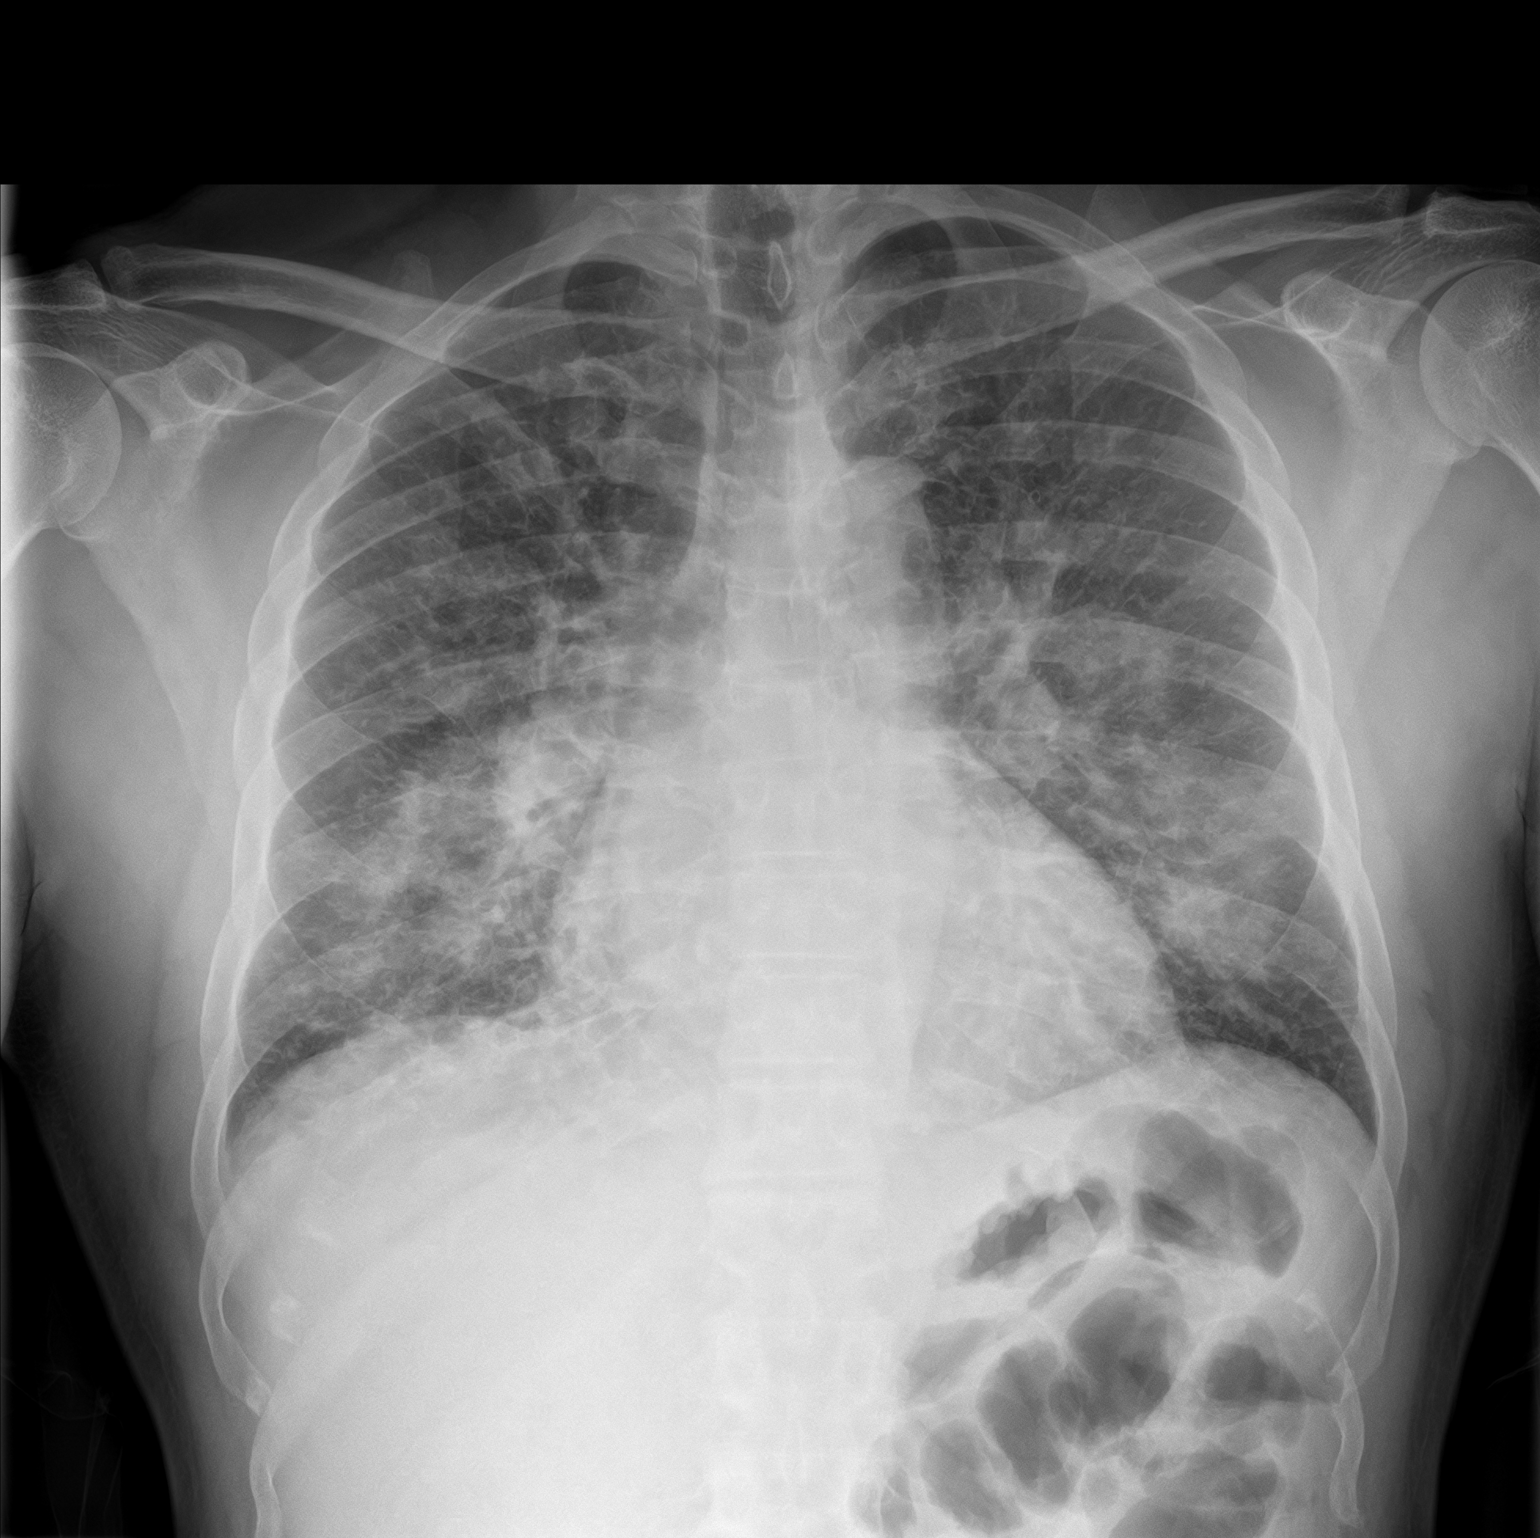
[im 2/2]
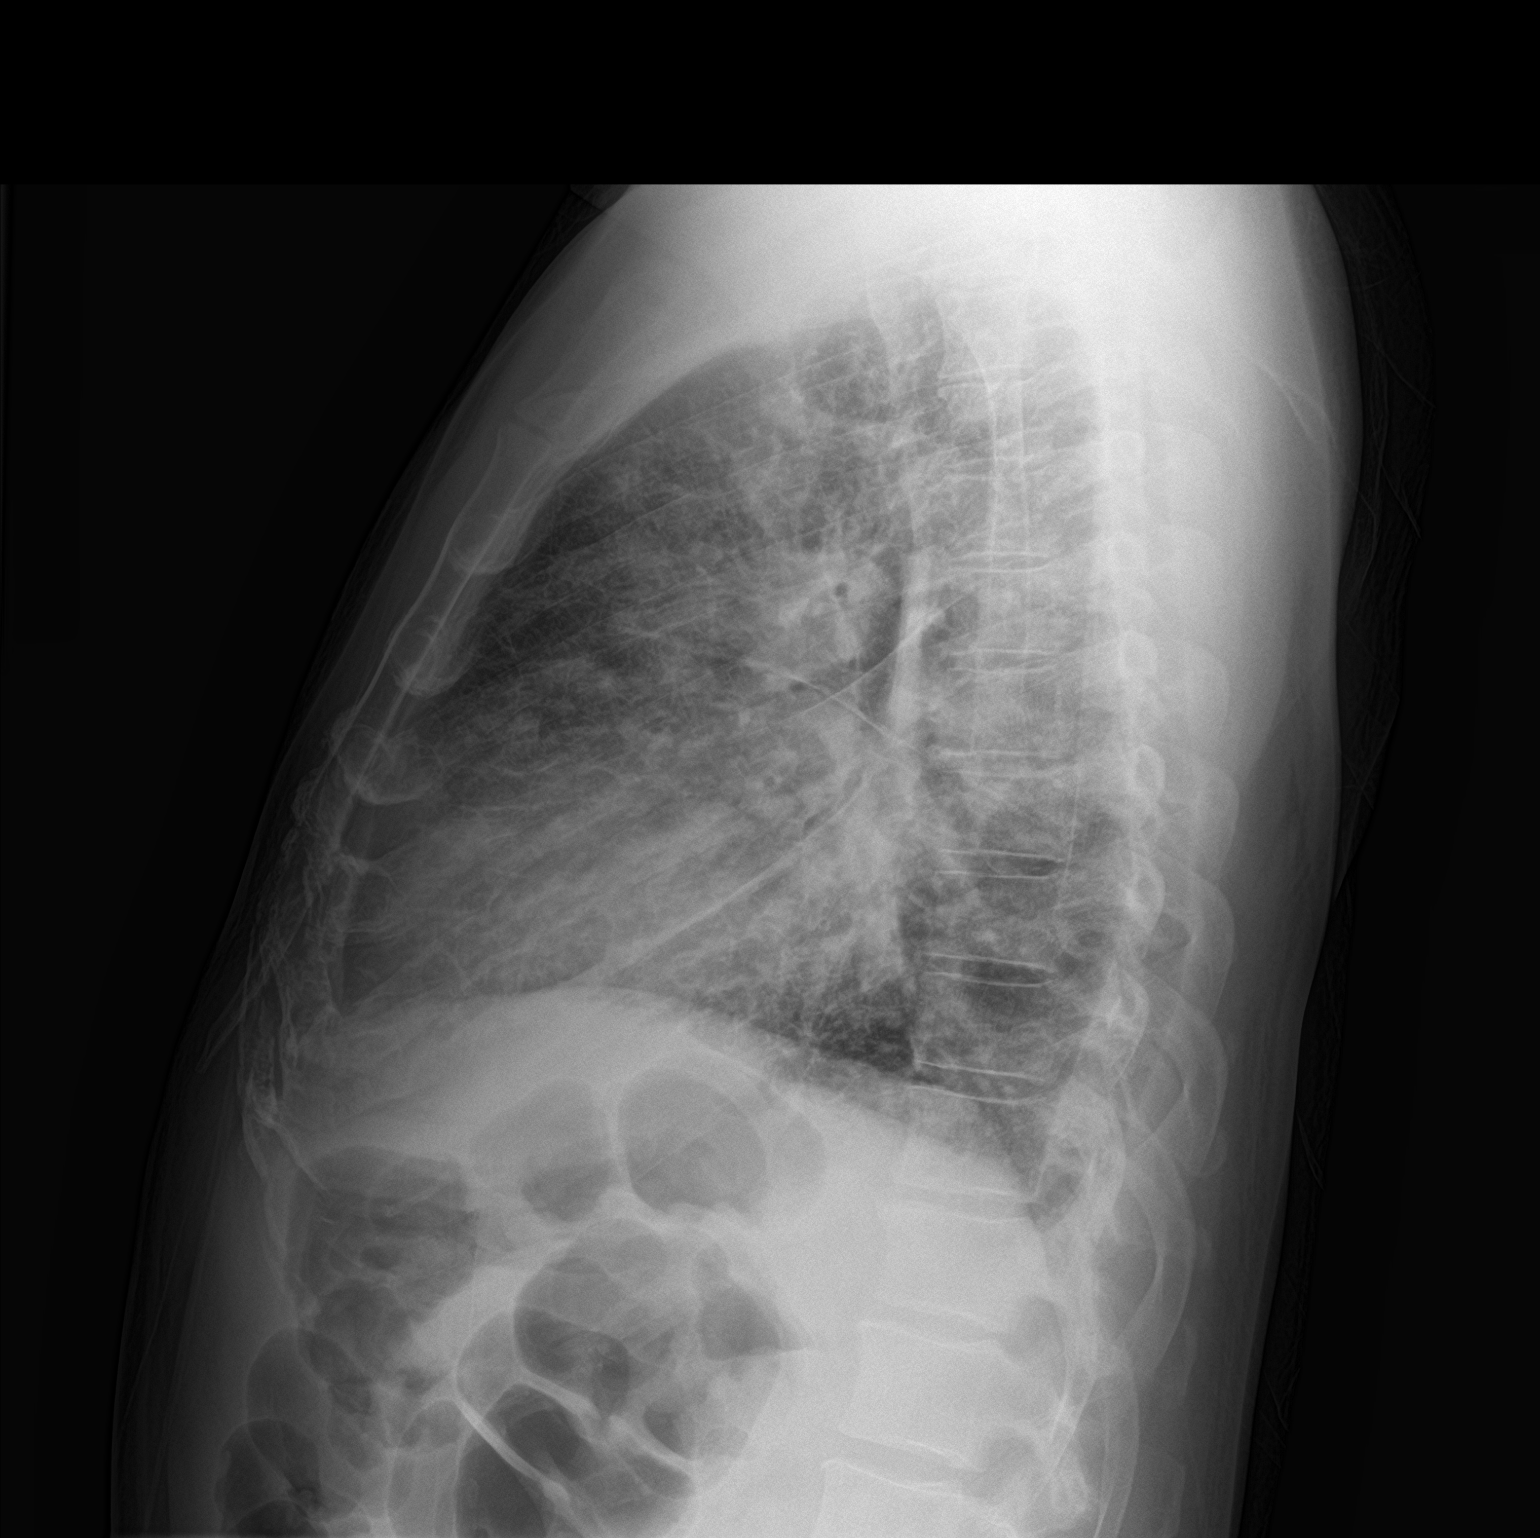

[2 of 2 positions shown; findings below may reference images not displayed]

FINDINGS: There is scattered bilateral airspace opacities. There is no
pneumothorax. No large pleural effusion. The heart size is normal.
There are prominent interstitial lung markings.
IMPRESSION: Scattered bilateral airspace opacities concerning for multifocal
pneumonia (viral or bacterial).

## 2020-07-15 MED ORDER — ENOXAPARIN SODIUM 40 MG/0.4ML ~~LOC~~ SOLN
40.0000 mg | SUBCUTANEOUS | Status: DC
  Administered 2020-07-15 – 2020-07-16 (×2): 40 mg via SUBCUTANEOUS
  Filled 2020-07-15: qty 0.4

## 2020-07-15 MED ORDER — INSULIN ASPART 100 UNIT/ML ~~LOC~~ SOLN
0.0000 [IU] | Freq: Three times a day (TID) | SUBCUTANEOUS | Status: DC
  Administered 2020-07-16: 8 [IU] via SUBCUTANEOUS
  Administered 2020-07-16: 3 [IU] via SUBCUTANEOUS
  Administered 2020-07-17 (×2): 2 [IU] via SUBCUTANEOUS
  Administered 2020-07-17: 3 [IU] via SUBCUTANEOUS
  Filled 2020-07-15 (×5): qty 1

## 2020-07-15 MED ORDER — LISINOPRIL 5 MG PO TABS
5.0000 mg | ORAL_TABLET | Freq: Every day | ORAL | Status: DC
  Administered 2020-07-16 – 2020-07-17 (×2): 5 mg via ORAL
  Filled 2020-07-15 (×2): qty 1

## 2020-07-15 MED ORDER — METOPROLOL SUCCINATE ER 25 MG PO TB24
25.0000 mg | ORAL_TABLET | Freq: Every day | ORAL | Status: DC
  Administered 2020-07-16 – 2020-07-17 (×2): 25 mg via ORAL
  Filled 2020-07-15 (×2): qty 1

## 2020-07-15 MED ORDER — ASPIRIN EC 81 MG PO TBEC
81.0000 mg | DELAYED_RELEASE_TABLET | Freq: Every day | ORAL | Status: DC
  Administered 2020-07-16 – 2020-07-17 (×2): 81 mg via ORAL
  Filled 2020-07-15 (×2): qty 1

## 2020-07-15 MED ORDER — SODIUM CHLORIDE 0.9 % IV SOLN
2.0000 g | INTRAVENOUS | Status: DC
  Administered 2020-07-15: 2 g via INTRAVENOUS

## 2020-07-15 MED ORDER — LEVOFLOXACIN IN D5W 750 MG/150ML IV SOLN
750.0000 mg | Freq: Once | INTRAVENOUS | Status: AC
  Administered 2020-07-15: 750 mg via INTRAVENOUS
  Filled 2020-07-15: qty 150

## 2020-07-15 MED ORDER — INSULIN ASPART 100 UNIT/ML ~~LOC~~ SOLN: 0.0000 [IU] | Freq: Every day | SUBCUTANEOUS | Status: DC

## 2020-07-15 MED ORDER — SODIUM CHLORIDE 0.9 % IV SOLN
500.0000 mg | INTRAVENOUS | Status: DC
  Administered 2020-07-16: 500 mg via INTRAVENOUS

## 2020-07-15 MED ORDER — NITROGLYCERIN 0.4 MG SL SUBL: 0.4000 mg | SUBLINGUAL_TABLET | SUBLINGUAL | Status: DC | PRN

## 2020-07-15 MED ORDER — SODIUM CHLORIDE 0.9% FLUSH: 3.0000 mL | Freq: Once | INTRAVENOUS | Status: DC

## 2020-07-15 MED ORDER — ATORVASTATIN CALCIUM 80 MG PO TABS
80.0000 mg | ORAL_TABLET | Freq: Every day | ORAL | Status: DC
  Administered 2020-07-16 – 2020-07-17 (×2): 80 mg via ORAL
  Filled 2020-07-15 (×2): qty 1

## 2020-07-15 NOTE — ED Provider Notes (Signed)
[facility] Emergency Department Provider Note  Time seen: 8:24 PM  I have reviewed the triage vital signs and the nursing notes.   HISTORY  Chief Complaint Abdominal Pain and Back Pain   HPI Joshua [name] is a 49 y.o. male with a past medical history of diabetes, prior MI, presents to the emergency department with multiple complaints.  According to the patient for the past 1 to 2 days he has been feeling body aches, somewhat short of breath with a slight cough as well as abdominal fullness sensation, along with generalized fatigue/weakness.  No known fever.  Does state a slight cough denies any productive cough.  Denies any vomiting.   Past Medical History:  Diagnosis Date  . Diabetes mellitus without complication [facility])     Patient Active Problem List   Diagnosis Date Noted  . Acute ST elevation myocardial infarction (STEMI) involving left anterior descending (LAD) coronary artery ([place]) 09/09/2018  . STEMI involving left anterior descending coronary artery ([place]) 09/09/2018  . Hypoglycemia 06/02/2016    Past Surgical History:  Procedure Laterality Date  . CORONARY/GRAFT ACUTE MI REVASCULARIZATION N/A 09/09/2018   Procedure: Coronary/Graft Acute MI Revascularization;  Surgeon: Yolonda Kida, MD;  Location: [place] CV LAB;  Service: Cardiovascular;  Laterality: N/A;  . LEFT HEART CATH AND CORONARY ANGIOGRAPHY N/A 09/09/2018   Procedure: LEFT HEART CATH AND CORONARY ANGIOGRAPHY;  Surgeon: Yolonda Kida, MD;  Location: [place] CV LAB;  Service: Cardiovascular;  Laterality: N/A;  . none      Prior to Admission medications   Medication Sig Start Date End Date Taking? Authorizing Provider  aspirin EC 81 MG EC tablet Take 1 tablet (81 mg total) by mouth daily. 09/11/18   Demetrios Loll, MD  atorvastatin (LIPITOR) 80 MG tablet Take 1 tablet (80 mg total) by mouth daily at 6 PM. 09/10/18   Demetrios Loll, MD  clopidogrel (PLAVIX) 75 MG  tablet Take 1 tablet (75 mg total) by mouth daily with breakfast. 09/11/18   Demetrios Loll, MD  lisinopril (PRINIVIL,ZESTRIL) 5 MG tablet Take 1 tablet (5 mg total) by mouth daily. 09/10/18   Demetrios Loll, MD  metFORMIN (GLUCOPHAGE) 500 MG tablet Take 1 tablet (500 mg total) by mouth 2 (two) times daily with a meal. 09/10/18 09/10/19  Demetrios Loll, MD  metoprolol succinate (TOPROL-XL) 25 MG 24 hr tablet Take 1 tablet (25 mg total) by mouth daily. Take with or immediately following a meal. 09/11/18   Demetrios Loll, MD  nitroGLYCERIN (NITROSTAT) 0.4 MG SL tablet Place 1 tablet (0.4 mg total) under the tongue every 5 (five) minutes x 3 doses as needed for chest pain. 09/10/18   Demetrios Loll, MD    No Known Allergies  No family history on file.  Social History Social History   Tobacco Use  . Smoking status: Current Every Day Smoker  . Smokeless tobacco: Never Used  Substance Use Topics  . Alcohol use: Yes    Alcohol/week: 0.0 standard drinks  . Drug use: Not Currently    Comment: pt states no found unknown white substance in mouth    Review of Systems Constitutional: Negative for fever. Cardiovascular: Negative for chest pain. Respiratory: Negative for shortness of breath. Gastrointestinal: Patient states abdominal tightness/pressure but denies "pain." Genitourinary: Negative for urinary compaints Musculoskeletal: Negative for musculoskeletal complaints Neurological: Negative for headache All other ROS negative  ____________________________________________   PHYSICAL EXAM:  VITAL SIGNS: ED Triage Vitals  Enc Vitals Group  BP 07/15/20 1828 108/77     Pulse Rate 07/15/20 1828 84     Resp 07/15/20 1828 20     Temp 07/15/20 1828 98.3 F (36.8 C)     Temp Source 07/15/20 1828 Oral     SpO2 07/15/20 1828 92 %     Weight 07/15/20 1829 180 lb (81.6 kg)     Height 07/15/20 1829 5\' 10"  (1.778 m)     Head Circumference --      Peak Flow --      Pain Score 07/15/20 1829 5     Pain Loc --       Pain Edu? --      Excl. in [place]? --    Constitutional: Alert and oriented. Well appearing and in no distress. Eyes: Normal exam ENT      Head: Normocephalic and atraumatic.      Mouth/Throat: Mucous membranes are moist. Cardiovascular: Normal rate, regular rhythm. Respiratory: Normal respiratory effort without tachypnea nor retractions. Breath sounds are clear Gastrointestinal: Soft and nontender. No distention.   Musculoskeletal: Nontender with normal range of motion in all extremities.  Neurologic:  Normal speech and language. No gross focal neurologic deficits Skin:  Skin is warm, dry and intact.  Psychiatric: Mood and affect are normal.   ____________________________________________    EKG  EKG viewed and interpreted by myself shows a normal sinus rhythm 85 bpm with a narrow QRS, normal axis, normal intervals, nonspecific ST changes.  ____________________________________________    RADIOLOGY  IMPRESSION:  Scattered bilateral airspace opacities concerning for multifocal  pneumonia (viral or bacterial).   ____________________________________________   INITIAL IMPRESSION / ASSESSMENT AND PLAN / ED COURSE  Pertinent labs & imaging results that were available during my care of the patient were reviewed by me and considered in my medical decision making (see chart for details).   Patient presents emergency department for 1 to 2 days of generalized fatigue body aches states abdominal fullness.  Patient denies any fever but does state shortness of breath with a slight cough.  Patient's chest x-ray is concerning for pneumonia possibly Covid.  Patient has not received Covid vaccinations.  Patient's lab work otherwise shows a significant drop in hemoglobin.  I spoke to the patient he states he has noticed intermittently dark stool but denies any currently.  Rectal examination performed by myself shows light brown stool that is guaiac negative.  Patient's glucose is elevated in the upper  200s.  Patient states he ran out of his Metformin several days ago, we will refill for the patient.  Patient does have a lower O2 saturation 92%.  Covid swab is pending.  Patient D satting to 86% on room air.  Patient's x-ray is concerning for pneumonia.  Covid test has resulted negative.  White blood cell count is normal.  Patient's hemoglobin is low.  Patient is hypoxic to 86% on room air.  Chest x-ray is concerning for pneumonia, we will cover with IV antibiotics as a precaution.  I have also added on a BNP to help evaluate for possible interstitial or pulmonary edema as well as a procalcitonin.  Patient will be admitted to the hospital service for further work-up and treatment.  Joshua [name] was evaluated in Emergency Department on 07/15/2020 for the symptoms described in the history of present illness. He was evaluated in the context of the global COVID-19 pandemic, which necessitated consideration that the patient might be at risk for infection with the SARS-CoV-2 virus that causes COVID-19. Institutional  protocols and algorithms that pertain to the evaluation of patients at risk for COVID-19 are in a state of rapid change based on information released by regulatory bodies including the CDC and federal and state organizations. These policies and algorithms were followed during the patient's care in the ED.  ____________________________________________   FINAL CLINICAL IMPRESSION(S) / ED DIAGNOSES  Body aches Dyspnea Pneumonia   Harvest Dark, MD 07/15/20 2207

## 2020-07-15 NOTE — ED Triage Notes (Signed)
Pt to triage via wheelchair.  Pt has abd pain , back pain , chest pain and sob.  Six since yesterday.  abd distended.  Last bm today  No n/v/d.  States sob when lying down .   Pt alert  Speech clear.

## 2020-07-15 NOTE — H&P (Signed)
History and Physical    Joshua [name] CWC:[id number] DOB: [birth year] DOA: 07/15/2020  PCP: Patient, No Pcp Per   Patient coming from: Home  I have personally briefly reviewed patient's old medical records in [place]  Chief Complaint: Shortness of breath, muscle aches, back pain, cough  HPI: Joshua [name] is a 49 y.o. male with medical history significant for history of MI and diabetes who presents to the emergency room with a 2-day history of body aches, cough, shortness of breath and feeling of abdominal fullness as well labs generalized weakness.  He denies fever, chills or chest pain.  Denies abdominal pain or change in bowel habits.  Admits to intermittent dark but not black stool and abdominal bloating.  Denies nausea or vomiting.  Denies dysuria.  Has not received Covid vaccines. ED Course: On arrival, BP 108/77, pulse 84, temp 98.3, O2 sat 92% on room air, desaturating to 86% with exertion.  Blood work notable for normal WBC of 8.4, hemoglobin 8.6 down from 13 a year prior.  Troponin 6.  Troponin pending, BNP pending.  Covid negative.  Chest x-ray with scattered bilateral airspace opacities concerning for multifocal pneumonia viral versus bacterial.  Stool guaiac negative.  Patient started on Levaquin, given negative Covid test while procalcitonin and BNP pending.  Hospitalist consulted for admission.  Review of Systems: As per HPI otherwise all other systems on review of systems negative.    Past Medical History:  Diagnosis Date  . Diabetes mellitus without complication [facility])     Past Surgical History:  Procedure Laterality Date  . CORONARY/GRAFT ACUTE MI REVASCULARIZATION N/A 09/09/2018   Procedure: Coronary/Graft Acute MI Revascularization;  Surgeon: Yolonda Kida, MD;  Location: [place] CV LAB;  Service: Cardiovascular;  Laterality: N/A;  . LEFT HEART CATH AND CORONARY ANGIOGRAPHY N/A 09/09/2018   Procedure: LEFT HEART CATH AND CORONARY  ANGIOGRAPHY;  Surgeon: Yolonda Kida, MD;  Location: [place] CV LAB;  Service: Cardiovascular;  Laterality: N/A;  . none       reports that he has been smoking. He has never used smokeless tobacco. He reports current alcohol use. He reports previous drug use.  Allergies  Allergen Reactions  . Penicillins Other (See Comments)    Patient unsure of allergy    History reviewed. No pertinent family history.    Prior to Admission medications   Medication Sig Start Date End Date Taking? Authorizing Provider  aspirin EC 81 MG EC tablet Take 1 tablet (81 mg total) by mouth daily. 09/11/18   Demetrios Loll, MD  atorvastatin (LIPITOR) 80 MG tablet Take 1 tablet (80 mg total) by mouth daily at 6 PM. 09/10/18   Demetrios Loll, MD  clopidogrel (PLAVIX) 75 MG tablet Take 1 tablet (75 mg total) by mouth daily with breakfast. 09/11/18   Demetrios Loll, MD  lisinopril (PRINIVIL,ZESTRIL) 5 MG tablet Take 1 tablet (5 mg total) by mouth daily. 09/10/18   Demetrios Loll, MD  metFORMIN (GLUCOPHAGE) 500 MG tablet Take 1 tablet (500 mg total) by mouth 2 (two) times daily with a meal. 09/10/18 09/10/19  Demetrios Loll, MD  metoprolol succinate (TOPROL-XL) 25 MG 24 hr tablet Take 1 tablet (25 mg total) by mouth daily. Take with or immediately following a meal. 09/11/18   Demetrios Loll, MD  nitroGLYCERIN (NITROSTAT) 0.4 MG SL tablet Place 1 tablet (0.4 mg total) under the tongue every 5 (five) minutes x 3 doses as needed for chest pain. 09/10/18   Demetrios Loll, MD  Physical Exam: Vitals:   07/15/20 2100 07/15/20 2112 07/15/20 2114 07/15/20 2130  BP: (!) 129/98   (!) 134/99  Pulse: 82 83 81 97  Resp: 16   16  Temp:      TempSrc:      SpO2: 94% (!) 86% 92% 96%  Weight:      Height:         Vitals:   07/15/20 2100 07/15/20 2112 07/15/20 2114 07/15/20 2130  BP: (!) 129/98   (!) 134/99  Pulse: 82 83 81 97  Resp: 16   16  Temp:      TempSrc:      SpO2: 94% (!) 86% 92% 96%  Weight:      Height:           Constitutional:  Appears unwell.  Conversational dyspnea HEENT:      Head: Normocephalic and atraumatic.         Eyes: PERLA, EOMI, Conjunctivae are normal. Sclera is non-icteric.       Mouth/Throat: Mucous membranes are moist.       Neck: Supple with no signs of meningismus. Cardiovascular: Regular rate and rhythm. No murmurs, gallops, or rubs. 2+ symmetrical distal pulses are present . No JVD. No LE edema Respiratory: Respiratory effort increased.bilateral gastrointestinal: Soft, non tender, and non distended with positive bowel sounds. No rebound or guarding. Genitourinary: No CVA tenderness. Musculoskeletal: Nontender with normal range of motion in all extremities. No cyanosis, or erythema of extremities. Neurologic: Normal speech and language. Face is symmetric. Moving all extremities. No gross focal neurologic deficits . Skin: Skin is warm, dry.  No rash or ulcers Psychiatric: Mood and affect are normal Speech and behavior are normal   Labs on Admission: I have personally reviewed following labs and imaging studies  CBC: Recent Labs  Lab 07/15/20 1832  WBC 8.4  HGB 8.6*  HCT 27.5*  MCV 78.3*  PLT 585   Basic Metabolic Panel: Recent Labs  Lab 07/15/20 1832  NA 132*  K 4.8  CL 103  CO2 23  GLUCOSE 290*  BUN 12  CREATININE 1.14  CALCIUM 8.3*   GFR: Estimated Creatinine Clearance: 81.8 mL/min (by C-G formula based on SCr of 1.14 mg/dL). Liver Function Tests: No results for input(s): AST, ALT, ALKPHOS, BILITOT, PROT, ALBUMIN in the last 168 hours. No results for input(s): LIPASE, AMYLASE in the last 168 hours. No results for input(s): AMMONIA in the last 168 hours. Coagulation Profile: No results for input(s): INR, PROTIME in the last 168 hours. Cardiac Enzymes: No results for input(s): CKTOTAL, CKMB, CKMBINDEX, TROPONINI in the last 168 hours. BNP (last 3 results) No results for input(s): PROBNP in the last 8760 hours. HbA1C: No results for input(s):  HGBA1C in the last 72 hours. CBG: No results for input(s): GLUCAP in the last 168 hours. Lipid Profile: No results for input(s): CHOL, HDL, LDLCALC, TRIG, CHOLHDL, LDLDIRECT in the last 72 hours. Thyroid Function Tests: No results for input(s): TSH, T4TOTAL, FREET4, T3FREE, THYROIDAB in the last 72 hours. Anemia Panel: No results for input(s): VITAMINB12, FOLATE, FERRITIN, TIBC, IRON, RETICCTPCT in the last 72 hours. Urine analysis: No results found for: COLORURINE, APPEARANCEUR, [place], [place], GLUCOSEU, [place], BILIRUBINUR, KETONESUR, PROTEINUR, UROBILINOGEN, NITRITE, LEUKOCYTESUR  Radiological Exams on Admission: DG Chest 2 View  Result Date: 07/15/2020 CLINICAL DATA:  Chest pain EXAM: CHEST - 2 VIEW COMPARISON:  09/09/2018 FINDINGS: There is scattered bilateral airspace opacities. There is no pneumothorax. No large pleural effusion. The heart size is  normal. There are prominent interstitial lung markings. IMPRESSION: Scattered bilateral airspace opacities concerning for multifocal pneumonia (viral or bacterial). Electronically Signed   By: Constance Holster M.D.   On: 07/15/2020 19:18    EKG: Independently reviewed. Interpretation : NSR with T wave changes lateral leads  Assessment/Plan 49 year old male history of MI and diabetes presenting with 2-day history of body aches, cough, dyspnea and weakness.  Work-up significant for hypoxia of 86% on room air, anemia of 8.6, multifocal pneumonia on CXR.  Acute respiratory failure with hypoxia (HCC) -Bilateral airspace opacities on chest x-ray with differential including bacterial pneumonia, viral pneumonia, congestive heart failure -Supplemental O2 to keep sats over 94% -Follow BNP, procalcitonin and D-dimer    Multifocal pneumonia, viral versus bacterial -Follow procalcitonin -Rocephin and azithromycin given negative Covid PCR -If procalcitonin suggests viral etiology, will consider repeating Covid PCR -Inflammatory  biomarkers  Possible congestive heart failure -Bilateral airspace opacities on chest x-ray, possibly related to symptomatic anemia -Follow BNP -Patient has history of MI.  Troponin negative -Consider echocardiogram in the a.m.    Symptomatic anemia -Hemoglobin 8.6, down from 13 a year prior and stool guaiac negative -Continue to trend hemoglobin -Type and screen.  Transfuse if ongoing significant downward trend -Consider GI consult for endoscopy    Type 2 diabetes mellitus without complication (HCC) -Sliding scale insulin coverage pending med rec.  Patient has been out of his Metformin Sliding scale insulin coverage sliding scale insulin coverage sliding scale coverageSliding scale coverage pendign med rec    History of MI (myocardial infarction) --continue ASA metoprolol and atorvastatin --troponin 6, no chest pain EKG non acute    DVT prophylaxis: Lovenox  Code Status: full code  Family Communication:  none  Disposition Plan: Back to previous home environment Consults called: none  Status:At the time of admission, it appears that the appropriate admission status for this patient is INPATIENT. This is judged to be reasonable and necessary in order to provide the required intensity of service to ensure the patient's safety given the presenting symptoms, physical exam findings, and initial radiographic and laboratory data in the context of their  Comorbid conditions.   Patient requires inpatient status due to high intensity of service, high risk for further deterioration and high frequency of surveillance required.   I certify that at the point of admission it is my clinical judgment that the patient will require inpatient hospital care spanning beyond [place] MD [facility]     07/15/2020, 10:37 PM

## 2020-07-15 NOTE — ED Notes (Signed)
Pt oxygen levels dropped while patient sleeping, patient placed on 2 L of O2, Dr. Kerman Passey notified.

## 2020-07-15 NOTE — ED Notes (Signed)
Pt states that he has a tightness in his chest and back. Patient states he's been feeling shob but that he hasn't lost his sense of taste/smell or had fevers. No nvd.

## 2020-07-16 ENCOUNTER — Inpatient Hospital Stay (HOSPITAL_COMMUNITY)
Admit: 2020-07-16 | Discharge: 2020-07-16 | Disposition: A | Discharge status: Home / Self Care | Payer: Medicaid Other | Attending: Acute Care | Admitting: Acute Care

## 2020-07-16 ENCOUNTER — Encounter: Payer: Self-pay | Admitting: Internal Medicine

## 2020-07-16 DIAGNOSIS — I5023 Acute on chronic systolic (congestive) heart failure: Secondary | ICD-10-CM

## 2020-07-16 LAB — GLUCOSE, CAPILLARY
Glucose-Capillary: 164 mg/dL — ABNORMAL HIGH (ref 70–99)
Glucose-Capillary: 172 mg/dL — ABNORMAL HIGH (ref 70–99)
Glucose-Capillary: 186 mg/dL — ABNORMAL HIGH (ref 70–99)
Glucose-Capillary: 186 mg/dL — ABNORMAL HIGH (ref 70–99)
Glucose-Capillary: 292 mg/dL — ABNORMAL HIGH (ref 70–99)
Glucose-Capillary: 90 mg/dL (ref 70–99)

## 2020-07-16 LAB — ECHOCARDIOGRAM COMPLETE
AR max vel: 3.11 cm2
AV Area VTI: 3.92 cm2
AV Area mean vel: 3.15 cm2
AV Mean grad: 2.5 mmHg
AV Peak grad: 4 mmHg
Ao pk vel: 1 m/s
Area-P 1/2: 4.8 cm2
Calc EF: 32.5 %
Height: 70 in
S' Lateral: 4.52 cm
Single Plane A2C EF: 32.2 %
Single Plane A4C EF: 27.6 %
Weight: 2880 oz

## 2020-07-16 LAB — HEPATIC FUNCTION PANEL
ALT: 32 U/L (ref 0–44)
AST: 39 U/L (ref 15–41)
Albumin: 3.1 g/dL — ABNORMAL LOW (ref 3.5–5.0)
Alkaline Phosphatase: 94 U/L (ref 38–126)
Bilirubin, Direct: <0.1 mg/dL (ref 0.0–0.2)
Total Bilirubin: 0.7 mg/dL (ref 0.3–1.2)
Total Protein: 6.5 g/dL (ref 6.5–8.1)

## 2020-07-16 LAB — TROPONIN I (HIGH SENSITIVITY): Troponin I (High Sensitivity): 7 ng/L (ref <18)

## 2020-07-16 LAB — C-REACTIVE PROTEIN: CRP: 5.4 mg/dL — ABNORMAL HIGH (ref <1.0)

## 2020-07-16 LAB — FERRITIN: Ferritin: 8 ng/mL — ABNORMAL LOW (ref 24–336)

## 2020-07-16 LAB — HEMOGLOBIN A1C
Hgb A1c MFr Bld: 8.8 % — ABNORMAL HIGH (ref 4.8–5.6)
Mean Plasma Glucose: 205.86 mg/dL

## 2020-07-16 LAB — HEMOGLOBIN AND HEMATOCRIT, BLOOD
HCT: 27.2 % — ABNORMAL LOW (ref 39.0–52.0)
Hemoglobin: 8.2 g/dL — ABNORMAL LOW (ref 13.0–17.0)

## 2020-07-16 LAB — BRAIN NATRIURETIC PEPTIDE: B Natriuretic Peptide: 558.4 pg/mL — ABNORMAL HIGH (ref 0.0–100.0)

## 2020-07-16 MED ORDER — SPIRONOLACTONE 25 MG PO TABS
25.0000 mg | ORAL_TABLET | Freq: Every day | ORAL | Status: DC
  Administered 2020-07-16 – 2020-07-17 (×2): 25 mg via ORAL
  Filled 2020-07-16 (×2): qty 1

## 2020-07-16 MED ORDER — LEVOFLOXACIN 500 MG PO TABS
750.0000 mg | ORAL_TABLET | Freq: Every day | ORAL | Status: DC
  Administered 2020-07-17: 750 mg via ORAL
  Filled 2020-07-16: qty 2

## 2020-07-16 MED ORDER — LEVOFLOXACIN 750 MG PO TABS: 750.0000 mg | ORAL_TABLET | Freq: Every day | ORAL | Status: DC

## 2020-07-16 MED ORDER — IPRATROPIUM-ALBUTEROL 0.5-2.5 (3) MG/3ML IN SOLN: 3.0000 mL | RESPIRATORY_TRACT | Status: DC | PRN

## 2020-07-16 MED ORDER — FUROSEMIDE 10 MG/ML IJ SOLN
60.0000 mg | Freq: Once | INTRAMUSCULAR | Status: AC
  Administered 2020-07-16: 60 mg via INTRAVENOUS

## 2020-07-16 MED ORDER — FUROSEMIDE 10 MG/ML IJ SOLN
20.0000 mg | Freq: Two times a day (BID) | INTRAMUSCULAR | Status: DC
  Administered 2020-07-16 – 2020-07-17 (×3): 20 mg via INTRAVENOUS
  Filled 2020-07-16: qty 2
  Filled 2020-07-16: qty 4
  Filled 2020-07-16: qty 2

## 2020-07-16 MED ORDER — METFORMIN HCL 500 MG PO TABS
500.0000 mg | ORAL_TABLET | Freq: Two times a day (BID) | ORAL | Status: DC
  Administered 2020-07-16 – 2020-07-17 (×3): 500 mg via ORAL
  Filled 2020-07-16 (×3): qty 1

## 2020-07-16 MED ORDER — FUROSEMIDE 40 MG PO TABS: 20.0000 mg | ORAL_TABLET | Freq: Every day | ORAL | Status: DC

## 2020-07-16 NOTE — Progress Notes (Signed)
[facility] [place] NAME: Joshua [name]    MR#:  [id number]  DATE OF BIRTH:  05/09/1971  SUBJECTIVE:  patient came in with fever shortness of breath and cough. He tells me he only takes his metformin and furosemide. He has not seen physician in one year. Denies chest pain. Wondering when he can go home.  REVIEW OF SYSTEMS:   Review of Systems  Constitutional: Negative for chills, fever and weight loss.  HENT: Negative for ear discharge, ear pain and nosebleeds.   Eyes: Negative for blurred vision, pain and discharge.  Respiratory: Positive for cough and shortness of breath. Negative for sputum production, wheezing and stridor.   Cardiovascular: Negative for chest pain, palpitations, orthopnea and PND.  Gastrointestinal: Negative for abdominal pain, diarrhea, nausea and vomiting.  Genitourinary: Negative for frequency and urgency.  Musculoskeletal: Negative for back pain and joint pain.  Neurological: Positive for weakness. Negative for sensory change, speech change and focal weakness.  Psychiatric/Behavioral: Negative for depression and hallucinations. The patient is not nervous/anxious.    Tolerating Diet:yes Tolerating PT: not needed  DRUG ALLERGIES:   Allergies  Allergen Reactions  . Penicillins Other (See Comments)    Patient unsure of allergy    VITALS:  Blood pressure 118/80, pulse (!) 105, temperature 100 F (37.8 C), temperature source Oral, resp. rate 18, height 5\' 10"  (1.778 m), weight 81.6 kg, SpO2 98 %.  PHYSICAL EXAMINATION:   Physical Exam  GENERAL:  49 y.o.-year-old patient lying in the bed with no acute distress.  EYES: Pupils equal, round, reactive to light and accommodation. No scleral icterus.   HEENT: Head atraumatic, normocephalic. Oropharynx and nasopharynx clear.  NECK:  Supple, no jugular venous distention. No thyroid enlargement, no tenderness.  LUNGS: Normal breath sounds bilaterally, no wheezing,  few basilar rales,no  rhonchi. No use of accessory muscles of respiration.  CARDIOVASCULAR: S1, S2 normal. No murmurs, rubs, or gallops.  ABDOMEN: Soft, nontender, nondistended. Bowel sounds present. No organomegaly or mass.  EXTREMITIES: No cyanosis, clubbing or edema b/l.    NEUROLOGIC: Cranial nerves II through XII are intact. No focal Motor or sensory deficits b/l.   PSYCHIATRIC:  patient is alert and oriented x 3.  SKIN: No obvious rash, lesion, or ulcer.   LABORATORY PANEL:  CBC Recent Labs  Lab 07/15/20 1832 07/15/20 1832 07/15/20 2259  WBC 8.4  --   --   HGB 8.6*   < > 8.2*  HCT 27.5*   < > 27.2*  PLT 294  --   --    < > = values in this interval not displayed.    Chemistries  Recent Labs  Lab 07/15/20 1832 07/15/20 2259  NA 132*  --   K 4.8  --   CL 103  --   CO2 23  --   GLUCOSE 290*  --   BUN 12  --   CREATININE 1.14  --   CALCIUM 8.3*  --   AST  --  39  ALT  --  32  ALKPHOS  --  94  BILITOT  --  0.7   Cardiac Enzymes No results for input(s): TROPONINI in the last 168 hours. RADIOLOGY:  DG Chest 2 View  Result Date: 07/15/2020 CLINICAL DATA:  Chest pain EXAM: CHEST - 2 VIEW COMPARISON:  09/09/2018 FINDINGS: There is scattered bilateral airspace opacities. There is no pneumothorax. No large pleural effusion. The heart size is normal. There are prominent interstitial  lung markings. IMPRESSION: Scattered bilateral airspace opacities concerning for multifocal pneumonia (viral or bacterial). Electronically Signed   By: Constance Holster M.D.   On: 07/15/2020 19:18   ECHOCARDIOGRAM COMPLETE  Result Date: 07/16/2020    ECHOCARDIOGRAM REPORT   Patient Name:   Joshua [name] Date of Exam: 07/16/2020 Medical Rec #:  [id number]             Height:       70.0 in Accession #:    [id number]            Weight:       180.0 lb Date of Birth:  [birth year]            BSA:          1.996 m Patient Age:    49 years              BP:           103/59 mmHg Patient Gender: M                      HR:           87 bpm. Exam Location:  [facility] Procedure: 2D Echo, Cardiac Doppler and Color Doppler Indications:     CHF 428.0  History:         Patient has no prior history of Echocardiogram examinations.                  Risk Factors:Diabetes.  Sonographer:     Sherrie Sport RDCS (AE) Referring Phys:  [id number] BRENDA MORRISON Diagnosing Phys: Nelva Bush MD IMPRESSIONS  1. Left ventricular ejection fraction, by estimation, is 25 to 30%. The left ventricle has severely decreased function. The left ventricle demonstrates regional wall motion abnormalities (see scoring diagram/findings for description). The left ventricular internal cavity size was mildly dilated. There is mild left ventricular hypertrophy. Left ventricular diastolic parameters are consistent with Grade III diastolic dysfunction (restrictive). Elevated left atrial pressure. There is akinesis of the mid and apical myocardium with relatively normal contraction at the base.  2. Right ventricular systolic function is normal. The right ventricular size is normal. Tricuspid regurgitation signal is inadequate for assessing PA pressure.  3. Left atrial size was mildly dilated.  4. The mitral valve is abnormal. Moderate mitral valve regurgitation. No evidence of mitral stenosis.  5. The aortic valve is tricuspid. Aortic valve regurgitation is not visualized. No aortic stenosis is present. FINDINGS  Left Ventricle: Left ventricular ejection fraction, by estimation, is 25 to 30%. The left ventricle has severely decreased function. The left ventricle demonstrates regional wall motion abnormalities. The left ventricular internal cavity size was mildly  dilated. There is mild left ventricular hypertrophy. Left ventricular diastolic parameters are consistent with Grade III diastolic dysfunction (restrictive). Elevated left atrial pressure.  LV Wall Scoring: There is akinesis of the mid and apical myocardium with relatively normal contraction at the  base. Right Ventricle: The right ventricular size is normal. No increase in right ventricular wall thickness. Right ventricular systolic function is normal. Tricuspid regurgitation signal is inadequate for assessing PA pressure. Left Atrium: Left atrial size was mildly dilated. Right Atrium: Right atrial size was normal in size. Pericardium: The pericardium was not well visualized. Mitral Valve: The mitral valve is abnormal. Moderate mitral valve regurgitation. No evidence of mitral valve stenosis. Tricuspid Valve: The tricuspid valve is normal in structure. Tricuspid valve regurgitation is trivial. Aortic Valve: The aortic valve is tricuspid.  Aortic valve regurgitation is not visualized. No aortic stenosis is present. Aortic valve mean gradient measures 2.5 mmHg. Aortic valve peak gradient measures 4.0 mmHg. Aortic valve area, by VTI measures 3.92 cm. Pulmonic Valve: The pulmonic valve was normal in structure. Pulmonic valve regurgitation is trivial. No evidence of pulmonic stenosis. Aorta: The aortic root is normal in size and structure. Pulmonary Artery: The pulmonary artery is of normal size. Venous: The inferior vena cava was not well visualized. IAS/Shunts: The interatrial septum was not well visualized.  LEFT VENTRICLE PLAX 2D LVIDd:         5.57 cm      Diastology LVIDs:         4.52 cm      LV e' lateral:   2.94 cm/s LV PW:         1.17 cm      LV E/e' lateral: 42.9 LV IVS:        1.12 cm      LV e' medial:    6.42 cm/s LVOT diam:     2.10 cm      LV E/e' medial:  19.6 LV SV:         59 LV SV Index:   29 LVOT Area:     3.46 cm  LV Volumes (MOD) LV vol d, MOD A2C: 147.0 ml LV vol d, MOD A4C: 156.0 ml LV vol s, MOD A2C: 99.7 ml LV vol s, MOD A4C: 113.0 ml LV SV MOD A2C:     47.3 ml LV SV MOD A4C:     156.0 ml LV SV MOD BP:      51.0 ml RIGHT VENTRICLE RV S prime:     12.10 cm/s TAPSE (M-mode): 2.4 cm LEFT ATRIUM           Index       RIGHT ATRIUM           Index LA diam:      4.60 cm 2.30 cm/m  RA Area:      12.90 cm LA Vol (A2C): 83.2 ml 41.69 ml/m RA Volume:   28.30 ml  14.18 ml/m LA Vol (A4C): 39.8 ml 19.94 ml/m  AORTIC VALVE                   PULMONIC VALVE AV Area (Vmax):    3.11 cm    PV Vmax:        0.66 m/s AV Area (Vmean):   3.15 cm    PV Peak grad:   1.7 mmHg AV Area (VTI):     3.92 cm    RVOT Peak grad: 2 mmHg AV Vmax:           99.70 cm/s AV Vmean:          72.500 cm/s AV VTI:            0.150 m AV Peak Grad:      4.0 mmHg AV Mean Grad:      2.5 mmHg LVOT Vmax:         89.40 cm/s LVOT Vmean:        65.900 cm/s LVOT VTI:          0.169 m LVOT/AV VTI ratio: 1.13  AORTA Ao Root diam: 3.00 cm MITRAL VALVE MV Area (PHT): 4.80 cm     SHUNTS MV Decel Time: 158 msec     Systemic VTI:  0.17 m MV E velocity: 126.00 cm/s  Systemic Diam: 2.10 cm MV  A velocity: 55.80 cm/s MV E/A ratio:  2.26 Harrell Gave End MD Electronically signed by Nelva Bush MD Signature Date/Time: 07/16/2020/11:48:43 AM    Final    ASSESSMENT AND PLAN:  Joshua [name] is a 49 y.o. male with medical history significant for history of MI and diabetes who presents to the emergency room with a 2-day history of body aches, cough, shortness of breath and feeling of abdominal fullness as well labs generalized weakness.   Chest x-ray with scattered bilateral airspace opacities concerning for multifocal pneumonia viral versus bacterial  Acute respiratory failure with hypoxia (HCC) due to pneumonia and CHF acute on chronic systolic -Bilateral airspace opacities on chest x-ray with differential including bacterial pneumonia, viral pneumonia, congestive heart failure -Supplemental O2 to keep sats over 94% -Follow BNP 558 -Procalcitonin 0.01 -pt had fever 100 yda--cont po levaquin    Multifocal pneumonia, viral versus bacterial -procalcitonin <0.10 -Rocephin and azithromycin --change to po levaquin -negative Covid PCR -Inflammatory biomarkers stable  Acute on Chronic systolic congestive heart failure -Bilateral airspace  opacities on chest x-ray, possibly related to symptomatic pneumonia and pulmonary edema -Follow BNP-- 558 -Patient has history of MI.  Troponin negative -Consider echocardiogram -- shows EF of 20 to 25% with severe cardiomyopathy -IV Lasix 20 BID - continue metoprolol, spironolactone, lisinopril   Symptomatic anemia -Hemoglobin 8.6, down from 13 a year prior and stool guaiac negative -Continue to trend hemoglobin -Consider GI consult for endoscopy as out pt    Type 2 diabetes mellitus without complication (HCC) -Sliding scale insulin coverage pending med rec.  Patient has been out of his Metformin Sliding scale insulin coverage sliding scale insulin coverage sliding scale coverageSliding scale coverage pendign med rec -will resume metfromin    History of MI (myocardial infarction) --continue ASA, metoprolol and atorvastatin --troponin 6, no chest pain EKG non acute    DVT prophylaxis: Lovenox  Code Status: full code  Family Communication:  none  Disposition Plan: Back to previous home environment in 1 to 2 days Consults called: none  StatusStatus is: Inpatient  Patient currently being treated for community acquired pneumonia and CHF. Requiring IV Lasix.  TOC for discharge planning--- needs PCP as outpatient. Patient has Medicaid.      TOTAL TIME TAKING CARE OF THIS PATIENT: *25* minutes.  >50% time spent on counselling and coordination of care  Note: This dictation was prepared with Dragon dictation along with smaller phrase technology. Any transcriptional errors that result from this process are unintentional.  Fritzi Mandes M.D    [facility]   CC: Primary care physician; Patient, No Pcp PerPatient ID: Joshua [name], male   DOB: [birth year], 49 y.o.   MRN: [id number]

## 2020-07-16 NOTE — ED Notes (Signed)
Report called to [facility] rn floor nurse.

## 2020-07-16 NOTE — ED Notes (Signed)
Resumed care from [place] rn.  Pt alert  Watching tv.  Pt waiting on admission.

## 2020-07-16 NOTE — Progress Notes (Signed)
*  PRELIMINARY RESULTS* Echocardiogram 2D Echocardiogram has been performed.  Sherrie Sport 07/16/2020, 9:18 AM

## 2020-07-16 NOTE — ED Notes (Signed)
Pt given sandwich tray and drink at this time.

## 2020-07-16 NOTE — ED Notes (Signed)
Pt ambulated to restroom at this time.

## 2020-07-17 LAB — CBC WITH DIFFERENTIAL/PLATELET
Abs Immature Granulocytes: 0.05 10*3/uL (ref 0.00–0.07)
Basophils Absolute: 0.1 10*3/uL (ref 0.0–0.1)
Basophils Relative: 1 %
Eosinophils Absolute: 0.3 10*3/uL (ref 0.0–0.5)
Eosinophils Relative: 2 %
HCT: 28.7 % — ABNORMAL LOW (ref 39.0–52.0)
Hemoglobin: 9.4 g/dL — ABNORMAL LOW (ref 13.0–17.0)
Immature Granulocytes: 1 %
Lymphocytes Relative: 27 %
Lymphs Abs: 2.9 10*3/uL (ref 0.7–4.0)
MCH: 25.1 pg — ABNORMAL LOW (ref 26.0–34.0)
MCHC: 32.8 g/dL (ref 30.0–36.0)
MCV: 76.5 fL — ABNORMAL LOW (ref 80.0–100.0)
Monocytes Absolute: 0.8 10*3/uL (ref 0.1–1.0)
Monocytes Relative: 8 %
Neutro Abs: 6.5 10*3/uL (ref 1.7–7.7)
Neutrophils Relative %: 61 %
Platelets: 342 10*3/uL (ref 150–400)
RBC: 3.75 MIL/uL — ABNORMAL LOW (ref 4.22–5.81)
RDW: 15.6 % — ABNORMAL HIGH (ref 11.5–15.5)
WBC: 10.5 10*3/uL (ref 4.0–10.5)
nRBC: 0 % (ref 0.0–0.2)

## 2020-07-17 LAB — COMPREHENSIVE METABOLIC PANEL
ALT: 21 U/L (ref 0–44)
AST: 21 U/L (ref 15–41)
Albumin: 3.2 g/dL — ABNORMAL LOW (ref 3.5–5.0)
Alkaline Phosphatase: 86 U/L (ref 38–126)
Anion gap: 9 (ref 5–15)
BUN: 17 mg/dL (ref 6–20)
CO2: 24 mmol/L (ref 22–32)
Calcium: 8.5 mg/dL — ABNORMAL LOW (ref 8.9–10.3)
Chloride: 105 mmol/L (ref 98–111)
Creatinine, Ser: 1.24 mg/dL (ref 0.61–1.24)
GFR calc Af Amer: >60 mL/min (ref >60)
GFR calc non Af Amer: >60 mL/min (ref >60)
Glucose, Bld: 170 mg/dL — ABNORMAL HIGH (ref 70–99)
Potassium: 4 mmol/L (ref 3.5–5.1)
Sodium: 138 mmol/L (ref 135–145)
Total Bilirubin: 0.6 mg/dL (ref 0.3–1.2)
Total Protein: 6.9 g/dL (ref 6.5–8.1)

## 2020-07-17 LAB — HIV ANTIBODY (ROUTINE TESTING W REFLEX): HIV Screen 4th Generation wRfx: NONREACTIVE

## 2020-07-17 LAB — BRAIN NATRIURETIC PEPTIDE: B Natriuretic Peptide: 236.4 pg/mL — ABNORMAL HIGH (ref 0.0–100.0)

## 2020-07-17 LAB — GLUCOSE, CAPILLARY
Glucose-Capillary: 146 mg/dL — ABNORMAL HIGH (ref 70–99)
Glucose-Capillary: 185 mg/dL — ABNORMAL HIGH (ref 70–99)
Glucose-Capillary: 122 mg/dL — ABNORMAL HIGH (ref 70–99)

## 2020-07-17 LAB — MAGNESIUM: Magnesium: 2.2 mg/dL (ref 1.7–2.4)

## 2020-07-17 NOTE — Progress Notes (Signed)
[place] at [place] NAME: Joshua [name]    MR#:  [id number]  DATE OF BIRTH:  [birth year]  SUBJECTIVE:  patient came in with fever shortness of breath and cough. He tells me he only takes his metformin and furosemide. He has not seen physician in one year. Denies chest pain. Wondering when he can go home.  REVIEW OF SYSTEMS:   Review of Systems  Constitutional: Negative for chills, fever and weight loss.  HENT: Negative for ear discharge, ear pain and nosebleeds.   Eyes: Negative for blurred vision, pain and discharge.  Respiratory: Positive for cough and shortness of breath. Negative for sputum production, wheezing and stridor.   Cardiovascular: Negative for chest pain, palpitations, orthopnea and PND.  Gastrointestinal: Negative for abdominal pain, diarrhea, nausea and vomiting.  Genitourinary: Negative for frequency and urgency.  Musculoskeletal: Negative for back pain and joint pain.  Neurological: Positive for weakness. Negative for sensory change, speech change and focal weakness.  Psychiatric/Behavioral: Negative for depression and hallucinations. The patient is not nervous/anxious.    Tolerating Diet:yes Tolerating PT: not needed  DRUG ALLERGIES:   Allergies  Allergen Reactions  . Penicillins Other (See Comments)    Patient unsure of allergy    VITALS:  Blood pressure 116/83, pulse 78, temperature 98.4 F (36.9 C), resp. rate 18, height 5\' 10"  (1.778 m), weight 81.6 kg, SpO2 99 %.  PHYSICAL EXAMINATION:   Physical Exam  GENERAL:  49 y.o.-year-old patient lying in the bed with no acute distress.  EYES: Pupils equal, round, reactive to light and accommodation. No scleral icterus.   HEENT: Head atraumatic, normocephalic. Oropharynx and nasopharynx clear.  NECK:  Supple, no jugular venous distention. No thyroid enlargement, no tenderness.  LUNGS: Normal breath sounds bilaterally, no wheezing, few basilar rales,no  rhonchi.  No use of accessory muscles of respiration.  CARDIOVASCULAR: S1, S2 normal. No murmurs, rubs, or gallops.  ABDOMEN: Soft, nontender, nondistended. Bowel sounds present. No organomegaly or mass.  EXTREMITIES: No cyanosis, clubbing or edema b/l.    NEUROLOGIC: Cranial nerves II through XII are intact. No focal Motor or sensory deficits b/l.   PSYCHIATRIC:  patient is alert and oriented x 3.  SKIN: No obvious rash, lesion, or ulcer.   LABORATORY PANEL:  CBC Recent Labs  Lab 07/15/20 1832 07/15/20 1832 07/15/20 2259  WBC 8.4  --   --   HGB 8.6*   < > 8.2*  HCT 27.5*   < > 27.2*  PLT 294  --   --    < > = values in this interval not displayed.    Chemistries  Recent Labs  Lab 07/15/20 1832 07/15/20 2259  NA 132*  --   K 4.8  --   CL 103  --   CO2 23  --   GLUCOSE 290*  --   BUN 12  --   CREATININE 1.14  --   CALCIUM 8.3*  --   AST  --  39  ALT  --  32  ALKPHOS  --  94  BILITOT  --  0.7   Cardiac Enzymes No results for input(s): TROPONINI in the last 168 hours. RADIOLOGY:  DG Chest 2 View  Result Date: 07/15/2020 CLINICAL DATA:  Chest pain EXAM: CHEST - 2 VIEW COMPARISON:  09/09/2018 FINDINGS: There is scattered bilateral airspace opacities. There is no pneumothorax. No large pleural effusion. The heart size is normal. There are prominent interstitial lung markings. IMPRESSION: Scattered  bilateral airspace opacities concerning for multifocal pneumonia (viral or bacterial). Electronically Signed   By: Constance Holster M.D.   On: 07/15/2020 19:18   ECHOCARDIOGRAM COMPLETE  Result Date: 07/16/2020    ECHOCARDIOGRAM REPORT   Patient Name:   Joshua [name] [place] Date of Exam: 07/16/2020 Medical Rec #:  [id number]             Height:       70.0 in Accession #:    [id number]            Weight:       180.0 lb Date of Birth:  01/06/1971            BSA:          1.996 m Patient Age:    64 years              BP:           103/59 mmHg Patient Gender: M                     HR:            87 bpm. Exam Location:  [facility] Procedure: 2D Echo, Cardiac Doppler and Color Doppler Indications:     CHF 428.0  History:         Patient has no prior history of Echocardiogram examinations.                  Risk Factors:Diabetes.  Sonographer:     Sherrie Sport RDCS (AE) Referring Phys:  [id number] BRENDA MORRISON Diagnosing Phys: Nelva Bush MD IMPRESSIONS  1. Left ventricular ejection fraction, by estimation, is 25 to 30%. The left ventricle has severely decreased function. The left ventricle demonstrates regional wall motion abnormalities (see scoring diagram/findings for description). The left ventricular internal cavity size was mildly dilated. There is mild left ventricular hypertrophy. Left ventricular diastolic parameters are consistent with Grade III diastolic dysfunction (restrictive). Elevated left atrial pressure. There is akinesis of the mid and apical myocardium with relatively normal contraction at the base.  2. Right ventricular systolic function is normal. The right ventricular size is normal. Tricuspid regurgitation signal is inadequate for assessing PA pressure.  3. Left atrial size was mildly dilated.  4. The mitral valve is abnormal. Moderate mitral valve regurgitation. No evidence of mitral stenosis.  5. The aortic valve is tricuspid. Aortic valve regurgitation is not visualized. No aortic stenosis is present. FINDINGS  Left Ventricle: Left ventricular ejection fraction, by estimation, is 25 to 30%. The left ventricle has severely decreased function. The left ventricle demonstrates regional wall motion abnormalities. The left ventricular internal cavity size was mildly  dilated. There is mild left ventricular hypertrophy. Left ventricular diastolic parameters are consistent with Grade III diastolic dysfunction (restrictive). Elevated left atrial pressure.  LV Wall Scoring: There is akinesis of the mid and apical myocardium with relatively normal contraction at the base. Right Ventricle: The right  ventricular size is normal. No increase in right ventricular wall thickness. Right ventricular systolic function is normal. Tricuspid regurgitation signal is inadequate for assessing PA pressure. Left Atrium: Left atrial size was mildly dilated. Right Atrium: Right atrial size was normal in size. Pericardium: The pericardium was not well visualized. Mitral Valve: The mitral valve is abnormal. Moderate mitral valve regurgitation. No evidence of mitral valve stenosis. Tricuspid Valve: The tricuspid valve is normal in structure. Tricuspid valve regurgitation is trivial. Aortic Valve: The aortic valve is tricuspid. Aortic valve regurgitation is  not visualized. No aortic stenosis is present. Aortic valve mean gradient measures 2.5 mmHg. Aortic valve peak gradient measures 4.0 mmHg. Aortic valve area, by VTI measures 3.92 cm. Pulmonic Valve: The pulmonic valve was normal in structure. Pulmonic valve regurgitation is trivial. No evidence of pulmonic stenosis. Aorta: The aortic root is normal in size and structure. Pulmonary Artery: The pulmonary artery is of normal size. Venous: The inferior vena cava was not well visualized. IAS/Shunts: The interatrial septum was not well visualized.  LEFT VENTRICLE PLAX 2D LVIDd:         5.57 cm      Diastology LVIDs:         4.52 cm      LV e' lateral:   2.94 cm/s LV PW:         1.17 cm      LV E/e' lateral: 42.9 LV IVS:        1.12 cm      LV e' medial:    6.42 cm/s LVOT diam:     2.10 cm      LV E/e' medial:  19.6 LV SV:         59 LV SV Index:   29 LVOT Area:     3.46 cm  LV Volumes (MOD) LV vol d, MOD A2C: 147.0 ml LV vol d, MOD A4C: 156.0 ml LV vol s, MOD A2C: 99.7 ml LV vol s, MOD A4C: 113.0 ml LV SV MOD A2C:     47.3 ml LV SV MOD A4C:     156.0 ml LV SV MOD BP:      51.0 ml RIGHT VENTRICLE RV S prime:     12.10 cm/s TAPSE (M-mode): 2.4 cm LEFT ATRIUM           Index       RIGHT ATRIUM           Index LA diam:      4.60 cm 2.30 cm/m  RA Area:     12.90 cm LA Vol (A2C): 83.2 ml  41.69 ml/m RA Volume:   28.30 ml  14.18 ml/m LA Vol (A4C): 39.8 ml 19.94 ml/m  AORTIC VALVE                   PULMONIC VALVE AV Area (Vmax):    3.11 cm    PV Vmax:        0.66 m/s AV Area (Vmean):   3.15 cm    PV Peak grad:   1.7 mmHg AV Area (VTI):     3.92 cm    RVOT Peak grad: 2 mmHg AV Vmax:           99.70 cm/s AV Vmean:          72.500 cm/s AV VTI:            0.150 m AV Peak Grad:      4.0 mmHg AV Mean Grad:      2.5 mmHg LVOT Vmax:         89.40 cm/s LVOT Vmean:        65.900 cm/s LVOT VTI:          0.169 m LVOT/AV VTI ratio: 1.13  AORTA Ao Root diam: 3.00 cm MITRAL VALVE MV Area (PHT): 4.80 cm     SHUNTS MV Decel Time: 158 msec     Systemic VTI:  0.17 m MV E velocity: 126.00 cm/s  Systemic Diam: 2.10 cm MV A velocity: 55.80 cm/s  MV E/A ratio:  2.26 Nelva Bush MD Electronically signed by Nelva Bush MD Signature Date/Time: 07/16/2020/11:48:43 AM    Final    ASSESSMENT AND PLAN:  Joshua [name] is a 49 y.o. male with medical history significant for history of MI and diabetes who presents to the emergency room with a 2-day history of body aches, cough, shortness of breath and feeling of abdominal fullness as well labs generalized weakness.   Chest x-ray with scattered bilateral airspace opacities concerning for multifocal pneumonia viral versus bacterial  Acute respiratory failure with hypoxia (HCC) due to pneumonia and CHF acute on chronic systolic -Bilateral airspace opacities on chest x-ray with differential including bacterial pneumonia, viral pneumonia, congestive heart failure -Supplemental O2 to keep sats over 94% -Follow BNP 558 -Procalcitonin 0.01 -pt had fever 100 yda--cont po levaquin.     Multifocal pneumonia, viral versus bacterial -procalcitonin <0.10 -Rocephin and azithromycin --change to po levaquin -negative Covid PCR -Inflammatory biomarkers stable  Acute on Chronic systolic congestive heart failure -Bilateral airspace opacities on chest x-ray,  possibly related to symptomatic pneumonia and pulmonary edema -Follow BNP-- 558 -Patient has history of MI.  Troponin negative -Consider echocardiogram -- shows EF of 20 to 25% with severe cardiomyopathy -IV Lasix 20 BID - continue metoprolol, spironolactone, lisinopril. We will request cardiology consult for possible ischemic eval for any new reduced ejection fraction and history of CAD status post PCI and stents.    Symptomatic anemia -Hemoglobin 8.6, down from 13 a year prior and stool guaiac negative -Continue to trend hemoglobin -Consider GI consult for endoscopy as out pt    Type 2 diabetes mellitus without complication (HCC) -Sliding scale insulin coverage pending med rec.  Patient has been out of his Metformin Sliding scale insulin coverage sliding scale insulin coverage sliding scale coverageSliding scale coverage pendign med rec -will resume metfromin    History of MI (myocardial infarction) --continue ASA, metoprolol and atorvastatin --troponin 6, no chest pain EKG non acute    DVT prophylaxis: Lovenox  Code Status: full code  Family Communication:  none  Disposition Plan: Back to previous home environment in 1 to 2 days Consults called: none  Status is: Inpatient  Patient currently being treated for community acquired pneumonia and CHF. Requiring IV Lasix.  TOC for discharge planning--- needs PCP as outpatient. Patient has Medicaid.    TOTAL TIME TAKING CARE OF THIS PATIENT: *25* minutes.  >50% time spent on counselling and coordination of care  Note: This dictation was prepared with Dragon dictation along with smaller phrase technology. Any transcriptional errors that result from this process are unintentional.  Para Skeans M.D  [phone] [facility]   CC: Primary care physician; Patient, No Pcp PerPatient ID: Joshua [name], male   DOB: 07-05-1971, 49 y.o.   MRN: [id number]

## 2020-07-17 NOTE — Progress Notes (Signed)
Pt left AMA. Form signed by pt and placed in chart. IV removed. Pt did not give me time to notify the on-call provider.

## 2020-07-17 NOTE — TOC Initial Note (Signed)
Transition of Care [facility]) - Initial/Assessment Note    Patient Details  Name: Joshua [name] MRN: [id number] Date of Birth: 01-15-1971  Transition of Care [facility]) CM/SW Contact:    Shelbie Ammons, RN Phone Number: 07/17/2020, 12:28 PM  Clinical Narrative:   RNCM met with patient at bedside due to consult for PCP needs. Patient was admitted to hospital due to Hypoxia and Pneumonia. He initially reported to this RNCM that he did not have a phone and had been homeless so he was unable to get to his appointments but then later he verified that the address and phone number we have on file are correct. He reports that he just moved there and just got that phone. Patient reports that he has been seen before at [facility] up until a year ago when he lost his transportation. Discussed with patient that his PCP is arranged through his Medicaid and that if he wants to change then he will need to get in touch with his Medicaid case worker. Further discussed that Medicaid transportation can be arranged as well but that he will again need to call DSS to arrange. Attempted to provide patient with phone number for DSS but he declined.              Expected Discharge Plan: Home/Self Care     Patient Goals and CMS Choice        Expected Discharge Plan and Services Expected Discharge Plan: Home/Self Care       Living arrangements for the past 2 months: Single Family Home                                      Prior Living Arrangements/Services Living arrangements for the past 2 months: Single Family Home Lives with:: Self Patient language and need for interpreter reviewed:: Yes Do you feel safe going back to the place where you live?: Yes      Need for Family Participation in Patient Care: Yes (Comment) Care giver support system in place?: Yes (comment)   Criminal Activity/Legal Involvement Pertinent to Current Situation/Hospitalization: No - Comment as needed  Activities of Daily  Living Home Assistive Devices/Equipment: None ADL Screening (condition at time of admission) Patient's cognitive ability adequate to safely complete daily activities?: Yes Is the patient deaf or have difficulty hearing?: No Does the patient have difficulty seeing, even when wearing glasses/contacts?: No Does the patient have difficulty concentrating, remembering, or making decisions?: No Patient able to express need for assistance with ADLs?: Yes Does the patient have difficulty dressing or bathing?: No Independently performs ADLs?: Yes (appropriate for developmental age) Does the patient have difficulty walking or climbing stairs?: No Weakness of Legs: None Weakness of Arms/Hands: None  Permission Sought/Granted                  Emotional Assessment Appearance:: Appears older than stated age     Orientation: : Oriented to Self, Oriented to Place, Oriented to  Time, Oriented to Situation Alcohol / Substance Use: Never Used, Not Applicable Psych Involvement: No (comment)  Admission diagnosis:  Acute respiratory failure ([place]) [J96.00] Hypoxia [R09.02] Community acquired pneumonia, unspecified laterality [J18.9] Patient Active Problem List   Diagnosis Date Noted  . Acute respiratory failure with hypoxia ([place]) 07/15/2020  . Multifocal pneumonia 07/15/2020  . Type 2 diabetes mellitus without complication ([place]) 18/56/3149  . History of MI (myocardial infarction) 07/15/2020  . Symptomatic  anemia 07/15/2020  . Acute ST elevation myocardial infarction (STEMI) involving left anterior descending (LAD) coronary artery ([place]) 09/09/2018  . STEMI involving left anterior descending coronary artery ([place]) 09/09/2018  . Hypoglycemia 06/02/2016   PCP:  Patient, No Pcp Per Pharmacy:   [facility] [address] ([address]), [place]) [place] [address] Phone: [phone] Fax: [phone]  [place], [place] [address] [place] [address] Phone: [phone] Fax: [phone]     Social Determinants of Health (SDOH) Interventions    Readmission Risk Interventions Readmission Risk Prevention Plan 09/10/2018  Post Dischage Appt Complete  Medication Screening Complete  Transportation Screening Complete  PCP follow-up Complete  Some recent data might be hidden

## 2020-07-20 LAB — CULTURE, BLOOD (ROUTINE X 2)
Culture: NO GROWTH
Culture: NO GROWTH
Special Requests: ADEQUATE
Special Requests: ADEQUATE

## 2020-07-20 NOTE — Discharge Summary (Signed)
Physician Discharge Summary  Joshua [name] GNF:[id number] DOB: 1971/03/13 DOA: 07/15/2020  PCP: Patient, No Pcp Per  Admit date: 07/15/2020 Discharge date: 07/20/2020  [facility] Discharge. Time spent: 15  minutes  Recommendations for Outpatient Follow-up:  F/U with pcp and cardiologist for hospital follow up appt.    Discharge Diagnoses:  Principal Problem:   Acute respiratory failure with hypoxia (HCC) Active Problems:   Multifocal pneumonia   Type 2 diabetes mellitus without complication (HCC)   History of MI (myocardial infarction)   Symptomatic anemia Acute respiratory failure with hypoxia (HCC) due to pneumonia and CHF acute on chronic systolic -Bilateral airspace opacities on chest x-ray with differential including bacterial pneumonia, viral pneumonia, congestive heart failure -Supplemental O2 to keep sats over 94% -Follow BNP 558 -Procalcitonin 0.01 -pt had fever 100 yda--cont po levaquin.   Multifocal pneumonia, viral versus bacterial -procalcitonin <0.10 -Rocephin and azithromycin --change to po levaquin -negative Covid PCR -Inflammatory biomarkers stable  Acute on Chronic systolic congestive heart failure -Bilateral airspace opacities on chest x-ray, possibly related to symptomatic pneumonia and pulmonary edema -Follow BNP-- 558 -Patient has history of MI. Troponin negative -Consider echocardiogram -- shows EF of 20 to 25% with severe cardiomyopathy -IV Lasix 20 BID - continue metoprolol, spironolactone, lisinopril. We will request cardiology consult for possible ischemic eval for any new reduced ejection fraction and history of CAD status post PCI and stents.   Symptomatic anemia -Hemoglobin 8.6, down from 13 a year prior and stool guaiac negative -Continue to trend hemoglobin -Consider GI consult for endoscopy as out pt  Type 2 diabetes mellitus without complication (HCC) -Sliding scale insulin coverage pending med rec. Patient has been out of  his Metformin Sliding scale insulin coverage sliding scale insulin coverage sliding scale coverageSliding scale coverage pendign med rec -will resume metfromin  History of MI (myocardial infarction) --continue ASA, metoprolol and atorvastatin --troponin 6, no chest pain EKG non acute  Discharge Condition:  Stable. Diet recommendation:  Cardiac and sodium restricted.   Filed Weights   07/15/20 1829 07/16/20 1717 07/17/20 0327  Weight: 81.6 kg 81.7 kg 81.6 kg    History of present illness:  Joshua [name] is a 49 y.o. male with medical history significant for history of MI and diabetes who presents to the emergency room with a 2-day history of body aches, cough, shortness of breath and feeling of abdominal fullness as well labs generalized weakness.  He denies fever, chills or chest pain.  Denies abdominal pain or change in bowel habits.  Admits to intermittent dark but not black stool and abdominal bloating.  Denies nausea or vomiting.  Denies dysuria.  Has not received Covid vaccines. ED Course: On arrival, BP 108/77, pulse 84, temp 98.3, O2 sat 92% on room air, desaturating to 86% with exertion.  Blood work notable for normal WBC of 8.4, hemoglobin 8.6 down from 13 a year prior.  Troponin 6.  Troponin pending, BNP pending.  Covid negative.  Chest x-ray with scattered bilateral airspace opacities concerning for multifocal pneumonia viral versus bacterial.  Stool guaiac negative.  Patient started on Levaquin, given negative Covid test while procalcitonin and BNP pending.  Hospitalist consulted for admission.  Hospital Course:  Pt was hospitalized for inpatient treatment of his congestive heart failure.He was responding well with iv diuretic therapy and cardiology was consulted to see him for his reduced ef of 20-25%.  Pt left ama and therapy was no completed.   Procedures: N/A.  Consultations: Cardiology consult.  Discharge Exam: Vitals:  07/17/20 1127 07/17/20 1654  BP:  96/66 101/88  Pulse: 89 84  Resp: 18 18  Temp: 98.2 F (36.8 C) 98 F (36.7 C)  SpO2: 100% 100%   Physical Exam HENT:     Head: Normocephalic and atraumatic.     Right Ear: External ear normal.     Left Ear: External ear normal.  Eyes:     Extraocular Movements: Extraocular movements intact.  Cardiovascular:     Rate and Rhythm: Normal rate and regular rhythm.     Heart sounds: Murmur heard.   Pulmonary:     Effort: Pulmonary effort is normal.     Breath sounds: Normal breath sounds.  Abdominal:     Palpations: Abdomen is soft.  Skin:    General: Skin is warm.  Neurological:     Mental Status: He is alert.  Psychiatric:        Mood and Affect: Mood normal.       Discharge Instructions    Allergies as of 07/17/2020      Reactions   Penicillins Other (See Comments)   Patient unsure of allergy      Medication List    ASK your doctor about these medications   aspirin 81 MG EC tablet Take 1 tablet (81 mg total) by mouth daily.   atorvastatin 80 MG tablet Commonly known as: LIPITOR Take 1 tablet (80 mg total) by mouth daily at 6 PM.   furosemide 20 MG tablet Commonly known as: LASIX Take by mouth.   lisinopril 5 MG tablet Commonly known as: ZESTRIL Take 1 tablet (5 mg total) by mouth daily.   metFORMIN 500 MG tablet Commonly known as: Glucophage Take 1 tablet (500 mg total) by mouth 2 (two) times daily with a meal.   metoprolol succinate 100 MG 24 hr tablet Commonly known as: TOPROL-XL Take 100 mg by mouth daily. Take with or immediately following a meal. Ask about: Which instructions should I use?   nitroGLYCERIN 0.4 MG SL tablet Commonly known as: NITROSTAT Place 1 tablet (0.4 mg total) under the tongue every 5 (five) minutes x 3 doses as needed for chest pain.   spironolactone 25 MG tablet Commonly known as: ALDACTONE Take 25 mg by mouth daily.      Allergies  Allergen Reactions  . Penicillins Other (See Comments)    Patient unsure of  allergy      The results of significant diagnostics from this hospitalization (including imaging, microbiology, ancillary and laboratory) are listed below for reference.    Significant Diagnostic Studies: DG Chest 2 View  Result Date: 07/15/2020 CLINICAL DATA:  Chest pain EXAM: CHEST - 2 VIEW COMPARISON:  09/09/2018 FINDINGS: There is scattered bilateral airspace opacities. There is no pneumothorax. No large pleural effusion. The heart size is normal. There are prominent interstitial lung markings. IMPRESSION: Scattered bilateral airspace opacities concerning for multifocal pneumonia (viral or bacterial). Electronically Signed   By: Constance Holster M.D.   On: 07/15/2020 19:18   ECHOCARDIOGRAM COMPLETE  Result Date: 07/16/2020    ECHOCARDIOGRAM REPORT   Patient Name:   PASTOR SGRO Cropper Date of Exam: 07/16/2020 Medical Rec #:  [id number]             Height:       70.0 in Accession #:    [id number]            Weight:       180.0 lb Date of Birth:  November 22, 1971  BSA:          1.996 m Patient Age:    24 years              BP:           103/59 mmHg Patient Gender: M                     HR:           87 bpm. Exam Location:  [facility] Procedure: 2D Echo, Cardiac Doppler and Color Doppler Indications:     CHF 428.0  History:         Patient has no prior history of Echocardiogram examinations.                  Risk Factors:Diabetes.  Sonographer:     Sherrie Sport RDCS (AE) Referring Phys:  [id number] BRENDA MORRISON Diagnosing Phys: Nelva Bush MD IMPRESSIONS  1. Left ventricular ejection fraction, by estimation, is 25 to 30%. The left ventricle has severely decreased function. The left ventricle demonstrates regional wall motion abnormalities (see scoring diagram/findings for description). The left ventricular internal cavity size was mildly dilated. There is mild left ventricular hypertrophy. Left ventricular diastolic parameters are consistent with Grade III diastolic dysfunction (restrictive). Elevated  left atrial pressure. There is akinesis of the mid and apical myocardium with relatively normal contraction at the base.  2. Right ventricular systolic function is normal. The right ventricular size is normal. Tricuspid regurgitation signal is inadequate for assessing PA pressure.  3. Left atrial size was mildly dilated.  4. The mitral valve is abnormal. Moderate mitral valve regurgitation. No evidence of mitral stenosis.  5. The aortic valve is tricuspid. Aortic valve regurgitation is not visualized. No aortic stenosis is present. FINDINGS  Left Ventricle: Left ventricular ejection fraction, by estimation, is 25 to 30%. The left ventricle has severely decreased function. The left ventricle demonstrates regional wall motion abnormalities. The left ventricular internal cavity size was mildly  dilated. There is mild left ventricular hypertrophy. Left ventricular diastolic parameters are consistent with Grade III diastolic dysfunction (restrictive). Elevated left atrial pressure.  LV Wall Scoring: There is akinesis of the mid and apical myocardium with relatively normal contraction at the base. Right Ventricle: The right ventricular size is normal. No increase in right ventricular wall thickness. Right ventricular systolic function is normal. Tricuspid regurgitation signal is inadequate for assessing PA pressure. Left Atrium: Left atrial size was mildly dilated. Right Atrium: Right atrial size was normal in size. Pericardium: The pericardium was not well visualized. Mitral Valve: The mitral valve is abnormal. Moderate mitral valve regurgitation. No evidence of mitral valve stenosis. Tricuspid Valve: The tricuspid valve is normal in structure. Tricuspid valve regurgitation is trivial. Aortic Valve: The aortic valve is tricuspid. Aortic valve regurgitation is not visualized. No aortic stenosis is present. Aortic valve mean gradient measures 2.5 mmHg. Aortic valve peak gradient measures 4.0 mmHg. Aortic valve area, by VTI  measures 3.92 cm. Pulmonic Valve: The pulmonic valve was normal in structure. Pulmonic valve regurgitation is trivial. No evidence of pulmonic stenosis. Aorta: The aortic root is normal in size and structure. Pulmonary Artery: The pulmonary artery is of normal size. Venous: The inferior vena cava was not well visualized. IAS/Shunts: The interatrial septum was not well visualized.  LEFT VENTRICLE PLAX 2D LVIDd:         5.57 cm      Diastology LVIDs:         4.52 cm  LV e' lateral:   2.94 cm/s LV PW:         1.17 cm      LV E/e' lateral: 42.9 LV IVS:        1.12 cm      LV e' medial:    6.42 cm/s LVOT diam:     2.10 cm      LV E/e' medial:  19.6 LV SV:         59 LV SV Index:   29 LVOT Area:     3.46 cm  LV Volumes (MOD) LV vol d, MOD A2C: 147.0 ml LV vol d, MOD A4C: 156.0 ml LV vol s, MOD A2C: 99.7 ml LV vol s, MOD A4C: 113.0 ml LV SV MOD A2C:     47.3 ml LV SV MOD A4C:     156.0 ml LV SV MOD BP:      51.0 ml RIGHT VENTRICLE RV S prime:     12.10 cm/s TAPSE (M-mode): 2.4 cm LEFT ATRIUM           Index       RIGHT ATRIUM           Index LA diam:      4.60 cm 2.30 cm/m  RA Area:     12.90 cm LA Vol (A2C): 83.2 ml 41.69 ml/m RA Volume:   28.30 ml  14.18 ml/m LA Vol (A4C): 39.8 ml 19.94 ml/m  AORTIC VALVE                   PULMONIC VALVE AV Area (Vmax):    3.11 cm    PV Vmax:        0.66 m/s AV Area (Vmean):   3.15 cm    PV Peak grad:   1.7 mmHg AV Area (VTI):     3.92 cm    RVOT Peak grad: 2 mmHg AV Vmax:           99.70 cm/s AV Vmean:          72.500 cm/s AV VTI:            0.150 m AV Peak Grad:      4.0 mmHg AV Mean Grad:      2.5 mmHg LVOT Vmax:         89.40 cm/s LVOT Vmean:        65.900 cm/s LVOT VTI:          0.169 m LVOT/AV VTI ratio: 1.13  AORTA Ao Root diam: 3.00 cm MITRAL VALVE MV Area (PHT): 4.80 cm     SHUNTS MV Decel Time: 158 msec     Systemic VTI:  0.17 m MV E velocity: 126.00 cm/s  Systemic Diam: 2.10 cm MV A velocity: 55.80 cm/s MV E/A ratio:  2.26 Harrell Gave End MD Electronically  signed by Nelva Bush MD Signature Date/Time: 07/16/2020/11:48:43 AM    Final     Microbiology: Recent Results (from the past 240 hour(s))  SARS Coronavirus 2 by RT PCR (hospital order, performed in [place] hospital lab) Nasopharyngeal Nasopharyngeal Swab     Status: None   Collection Time: 07/15/20  8:23 PM   Specimen: Nasopharyngeal Swab  Result Value Ref Range Status   SARS Coronavirus 2 NEGATIVE NEGATIVE Final    Comment: (NOTE) SARS-CoV-2 target nucleic acids are NOT DETECTED.  The SARS-CoV-2 RNA is generally detectable in upper and lower respiratory specimens during the acute phase of infection. The lowest concentration of SARS-CoV-2 viral  copies this assay can detect is 250 copies / mL. A negative result does not preclude SARS-CoV-2 infection and should not be used as the sole basis for treatment or other patient management decisions.  A negative result may occur with improper specimen collection / handling, submission of specimen other than nasopharyngeal swab, presence of viral mutation(s) within the areas targeted by this assay, and inadequate number of viral copies (<250 copies / mL). A negative result must be combined with clinical observations, patient history, and epidemiological information.  Fact Sheet for Patients:   StrictlyIdeas.no  Fact Sheet for Healthcare Providers: BankingDealers.co.za  This test is not yet approved or  cleared by the [place] FDA and has been authorized for detection and/or diagnosis of SARS-CoV-2 by FDA under an Emergency Use Authorization (EUA).  This EUA will remain in effect (meaning this test can be used) for the duration of the COVID-19 declaration under Section 564(b)(1) of the Act, 21 U.S.C. section 360bbb-3(b)(1), unless the authorization is terminated or revoked sooner.  Performed at [facility], [address] [place], [place] [address]   Blood culture  (routine x 2)     Status: None   Collection Time: 07/15/20 10:11 PM   Specimen: Left Antecubital; Blood  Result Value Ref Range Status   Specimen Description LEFT ANTECUBITAL  Final   Special Requests   Final    BOTTLES DRAWN AEROBIC AND ANAEROBIC Blood Culture adequate volume   Culture   Final    NO GROWTH 5 DAYS Performed at [facility], [address], [place], [place] [address]    Report Status 07/20/2020 FINAL  Final  Blood culture (routine x 2)     Status: None   Collection Time: 07/15/20 10:15 PM   Specimen: BLOOD RIGHT HAND  Result Value Ref Range Status   Specimen Description BLOOD RIGHT HAND  Final   Special Requests   Final    BOTTLES DRAWN AEROBIC AND ANAEROBIC Blood Culture adequate volume   Culture   Final    NO GROWTH 5 DAYS Performed at [facility], [address] [place], [place] [address]    Report Status 07/20/2020 FINAL  Final     Labs: Basic Metabolic Panel: Recent Labs  Lab 07/15/20 1832 07/17/20 0936  NA 132* 138  K 4.8 4.0  CL 103 105  CO2 23 24  GLUCOSE 290* 170*  BUN 12 17  CREATININE 1.14 1.24  CALCIUM 8.3* 8.5*  MG  --  2.2   Liver Function Tests: Recent Labs  Lab 07/15/20 2259 07/17/20 0936  AST 39 21  ALT 32 21  ALKPHOS 94 86  BILITOT 0.7 0.6  PROT 6.5 6.9  ALBUMIN 3.1* 3.2*   No results for input(s): LIPASE, AMYLASE in the last 168 hours. No results for input(s): AMMONIA in the last 168 hours. CBC: Recent Labs  Lab 07/15/20 1832 07/15/20 2259 07/17/20 0936  WBC 8.4  --  10.5  NEUTROABS  --   --  6.5  HGB 8.6* 8.2* 9.4*  HCT 27.5* 27.2* 28.7*  MCV 78.3*  --  76.5*  PLT 294  --  342   Cardiac Enzymes: No results for input(s): CKTOTAL, CKMB, CKMBINDEX, TROPONINI in the last 168 hours. BNP: BNP (last 3 results) Recent Labs    07/16/20 0218 07/17/20 0936  BNP 558.4* 236.4*    ProBNP (last 3 results) No results for input(s): PROBNP in the last 8760 hours.  CBG: Recent Labs  Lab  07/16/20 1718  07/16/20 2123 07/17/20 0750 07/17/20 1220 07/17/20 1655  GLUCAP 90 164* 146* 185* 122*       Signed:  Para Skeans MD.  [facility] 07/20/2020, 6:16 PM

## 2020-08-02 ENCOUNTER — Encounter: Payer: Self-pay | Admitting: Emergency Medicine

## 2020-08-02 ENCOUNTER — Other Ambulatory Visit: Payer: Self-pay

## 2020-08-02 ENCOUNTER — Emergency Department: Payer: Medicaid Other

## 2020-08-02 ENCOUNTER — Emergency Department
Admission: EM | Admit: 2020-08-02 | Discharge: 2020-08-02 | Disposition: A | Discharge status: Home / Self Care | Payer: Medicaid Other | Attending: Emergency Medicine | Admitting: Emergency Medicine

## 2020-08-02 DIAGNOSIS — I2510 Atherosclerotic heart disease of native coronary artery without angina pectoris: Secondary | ICD-10-CM | POA: Diagnosis not present

## 2020-08-02 DIAGNOSIS — I5023 Acute on chronic systolic (congestive) heart failure: Secondary | ICD-10-CM | POA: Insufficient documentation

## 2020-08-02 DIAGNOSIS — R079 Chest pain, unspecified: Secondary | ICD-10-CM | POA: Diagnosis present

## 2020-08-02 DIAGNOSIS — R0602 Shortness of breath: Secondary | ICD-10-CM | POA: Insufficient documentation

## 2020-08-02 DIAGNOSIS — F17210 Nicotine dependence, cigarettes, uncomplicated: Secondary | ICD-10-CM | POA: Diagnosis not present

## 2020-08-02 DIAGNOSIS — R600 Localized edema: Secondary | ICD-10-CM | POA: Insufficient documentation

## 2020-08-02 DIAGNOSIS — E119 Type 2 diabetes mellitus without complications: Secondary | ICD-10-CM | POA: Insufficient documentation

## 2020-08-02 DIAGNOSIS — Z79899 Other long term (current) drug therapy: Secondary | ICD-10-CM | POA: Diagnosis not present

## 2020-08-02 DIAGNOSIS — Z7982 Long term (current) use of aspirin: Secondary | ICD-10-CM | POA: Insufficient documentation

## 2020-08-02 DIAGNOSIS — R0789 Other chest pain: Secondary | ICD-10-CM | POA: Diagnosis not present

## 2020-08-02 DIAGNOSIS — Z7984 Long term (current) use of oral hypoglycemic drugs: Secondary | ICD-10-CM | POA: Insufficient documentation

## 2020-08-02 LAB — COMPREHENSIVE METABOLIC PANEL
ALT: 38 U/L (ref 0–44)
AST: 35 U/L (ref 15–41)
Albumin: 3.3 g/dL — ABNORMAL LOW (ref 3.5–5.0)
Alkaline Phosphatase: 105 U/L (ref 38–126)
Anion gap: 7 (ref 5–15)
BUN: 12 mg/dL (ref 6–20)
CO2: 23 mmol/L (ref 22–32)
Calcium: 8.2 mg/dL — ABNORMAL LOW (ref 8.9–10.3)
Chloride: 110 mmol/L (ref 98–111)
Creatinine, Ser: 1.21 mg/dL (ref 0.61–1.24)
GFR calc Af Amer: >60 mL/min (ref >60)
GFR calc non Af Amer: >60 mL/min (ref >60)
Glucose, Bld: 159 mg/dL — ABNORMAL HIGH (ref 70–99)
Potassium: 4.4 mmol/L (ref 3.5–5.1)
Sodium: 140 mmol/L (ref 135–145)
Total Bilirubin: 0.5 mg/dL (ref 0.3–1.2)
Total Protein: 6.7 g/dL (ref 6.5–8.1)

## 2020-08-02 LAB — CBC WITH DIFFERENTIAL/PLATELET
Abs Immature Granulocytes: 0.02 10*3/uL (ref 0.00–0.07)
Basophils Absolute: 0 10*3/uL (ref 0.0–0.1)
Basophils Relative: 0 %
Eosinophils Absolute: 0.2 10*3/uL (ref 0.0–0.5)
Eosinophils Relative: 2 %
HCT: 25.5 % — ABNORMAL LOW (ref 39.0–52.0)
Hemoglobin: 7.8 g/dL — ABNORMAL LOW (ref 13.0–17.0)
Immature Granulocytes: 0 %
Lymphocytes Relative: 31 %
Lymphs Abs: 2.4 10*3/uL (ref 0.7–4.0)
MCH: 23.7 pg — ABNORMAL LOW (ref 26.0–34.0)
MCHC: 30.6 g/dL (ref 30.0–36.0)
MCV: 77.5 fL — ABNORMAL LOW (ref 80.0–100.0)
Monocytes Absolute: 0.6 10*3/uL (ref 0.1–1.0)
Monocytes Relative: 9 %
Neutro Abs: 4.3 10*3/uL (ref 1.7–7.7)
Neutrophils Relative %: 58 %
Platelets: 339 10*3/uL (ref 150–400)
RBC: 3.29 MIL/uL — ABNORMAL LOW (ref 4.22–5.81)
RDW: 15.5 % (ref 11.5–15.5)
WBC: 7.5 10*3/uL (ref 4.0–10.5)
nRBC: 0 % (ref 0.0–0.2)

## 2020-08-02 LAB — GLUCOSE, CAPILLARY: Glucose-Capillary: 150 mg/dL — ABNORMAL HIGH (ref 70–99)

## 2020-08-02 LAB — TROPONIN I (HIGH SENSITIVITY)
Troponin I (High Sensitivity): 5 ng/L (ref <18)
Troponin I (High Sensitivity): 4 ng/L (ref <18)

## 2020-08-02 IMAGING — CR DG CHEST 2V
1 series · 2 of 2 positions shown · non-contrast
Comparison: [DATE].

CLINICAL DATA: Chest pain.

EXAM:
CHEST - 2 VIEW

[Series 1: dg chest 2 view · 0.14mm/px · 2 of 2 slices shown]
[im 1/2]
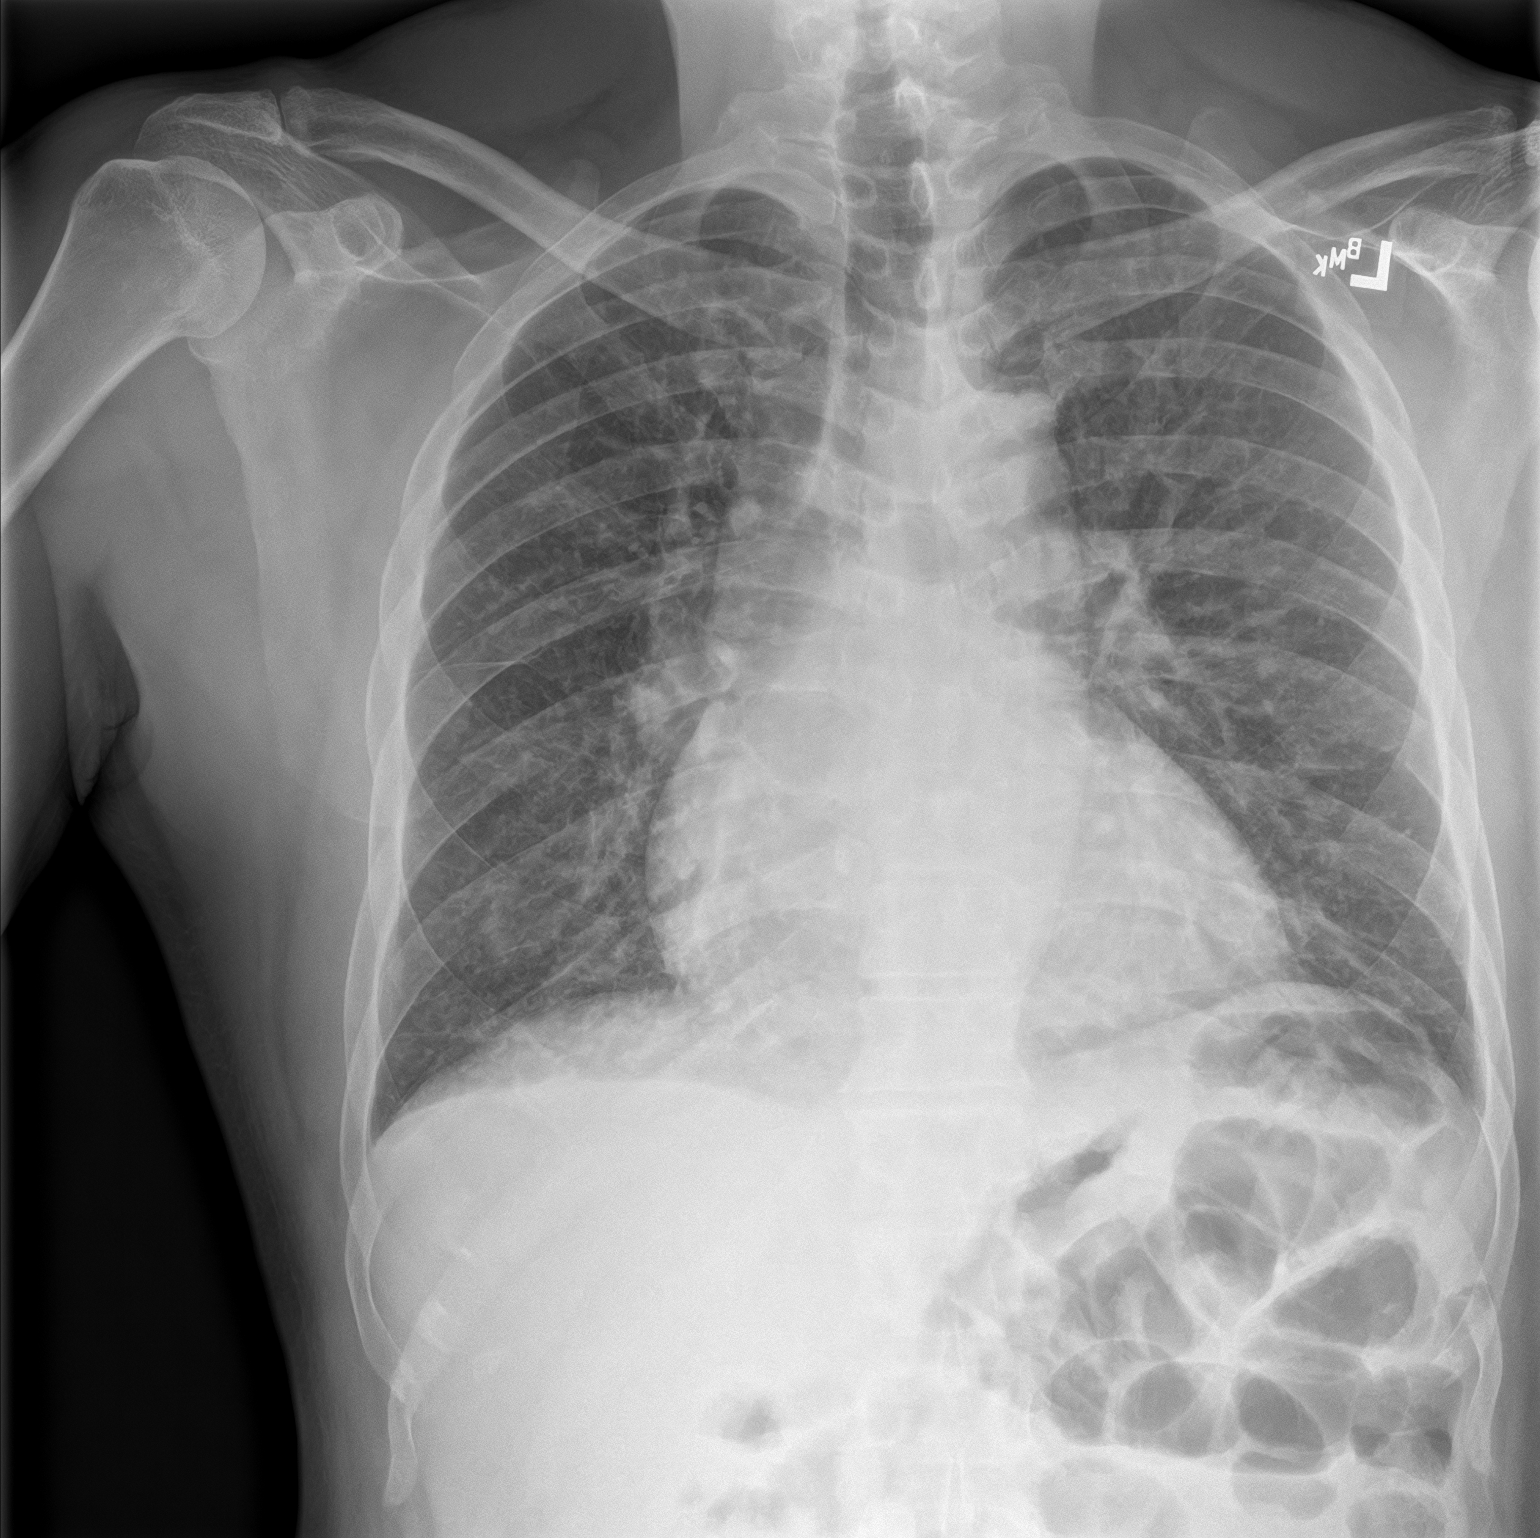
[im 2/2]
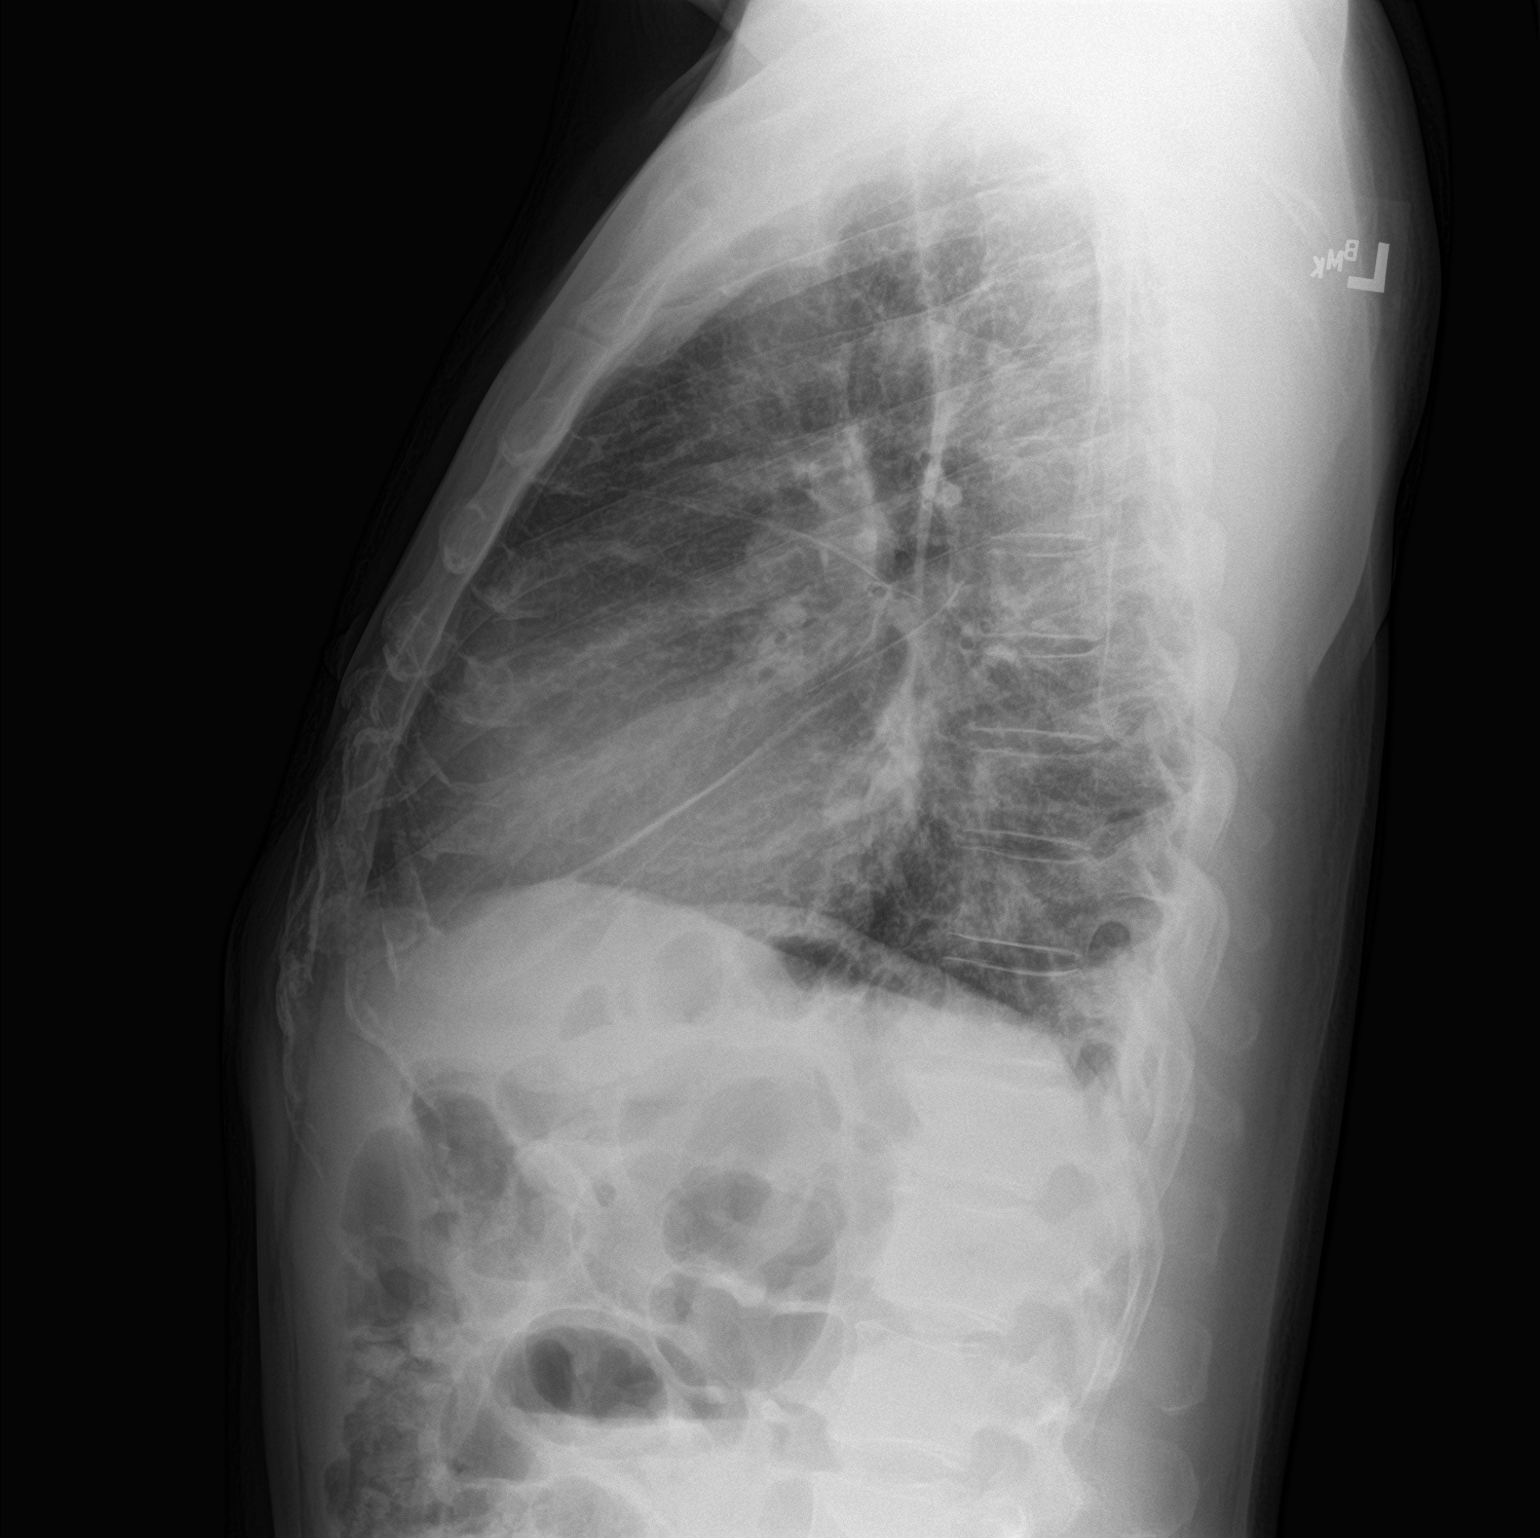

[2 of 2 positions shown; findings below may reference images not displayed]

FINDINGS: Mediastinum hilar structures normal. Cardiomegaly. No pulmonary
venous congestion. Bilateral interstitial prominence with
significant improvement from prior study of [DATE] most
consistent with resolving pneumonitis and or edema. No pleural
effusion or pneumothorax. Multiple air-filled loops of bowel noted
in the abdomen. Abdominal series can be obtained to further
evaluate. No acute bony abnormality.
IMPRESSION: 1. Cardiomegaly. No pulmonary venous congestion.
2. Bilateral interstitial prominence with significant improvement
from prior study of [DATE] most consistent with resolving
pneumonitis and or edema.
3. Multiple air-filled loops of bowel noted in the abdomen.
Abdominal series can be obtained to further evaluate.

## 2020-08-02 MED ORDER — ATORVASTATIN CALCIUM 20 MG PO TABS
80.0000 mg | ORAL_TABLET | Freq: Once | ORAL | Status: AC
  Administered 2020-08-02: 80 mg via ORAL
  Filled 2020-08-02: qty 4

## 2020-08-02 MED ORDER — FUROSEMIDE 20 MG PO TABS: 20.0000 mg | ORAL_TABLET | Freq: Every day | ORAL | 1 refills | Status: DC

## 2020-08-02 MED ORDER — METFORMIN HCL 500 MG PO TABS
500.0000 mg | ORAL_TABLET | Freq: Once | ORAL | Status: AC
  Administered 2020-08-02: 500 mg via ORAL
  Filled 2020-08-02: qty 1

## 2020-08-02 MED ORDER — METOPROLOL SUCCINATE ER 50 MG PO TB24
100.0000 mg | ORAL_TABLET | Freq: Once | ORAL | Status: AC
  Administered 2020-08-02: 100 mg via ORAL
  Filled 2020-08-02: qty 2

## 2020-08-02 MED ORDER — FUROSEMIDE 10 MG/ML IJ SOLN
40.0000 mg | Freq: Once | INTRAMUSCULAR | Status: AC
  Administered 2020-08-02: 40 mg via INTRAVENOUS
  Filled 2020-08-02: qty 4

## 2020-08-02 MED ORDER — ASPIRIN EC 81 MG PO TBEC
81.0000 mg | DELAYED_RELEASE_TABLET | Freq: Once | ORAL | Status: AC
  Administered 2020-08-02: 81 mg via ORAL
  Filled 2020-08-02: qty 1

## 2020-08-02 MED ORDER — METOPROLOL SUCCINATE ER 100 MG PO TB24
100.0000 mg | ORAL_TABLET | Freq: Every day | ORAL | 1 refills | Status: DC
Start: 2020-08-02 — End: 2020-10-05

## 2020-08-02 MED ORDER — METFORMIN HCL 500 MG PO TABS: 500.0000 mg | ORAL_TABLET | Freq: Every day | ORAL | 1 refills | Status: DC

## 2020-08-02 NOTE — ED Notes (Signed)
Pt provided sandwich tray and phone to call for ride

## 2020-08-02 NOTE — ED Notes (Signed)
Pt reports he is not sure what is going on and did not understand what the MD was trying to tell home about his heart. Pt reports is supposed to be on medication but does not have it.

## 2020-08-02 NOTE — Discharge Instructions (Signed)
You are being discharged with prescriptions for your fluid pill Lasix, diabetes medication Metformin, blood pressure/heart medication Toprol-XL.  Please pick up his medications from any pharmacy and start taking as prescribed.  Please take Tylenol and ibuprofen/Advil for your pain.  It is safe to take them together, or to alternate them every few hours.  Take up to 1000mg  of Tylenol at a time, up to 4 times per day.  Do not take more than 4000 mg of Tylenol in 24 hours.  For ibuprofen, take 400-600 mg, 4-5 times per day.  Return to the ED with any worsening symptoms.

## 2020-08-02 NOTE — ED Notes (Signed)
This RN to pt room to d/c pt, pt eating sandwich and talking to family on the phone, alert and oriented, NAD noted.  Pt asking RN "so like what's my diagnosis, what happens if I don't feel better, what medications are you giving me?". This RN explained that we will discuss discharge papers and review instructions, medications and when to return to ED.  This RN attempted to review d/c papers with pt.  Pt being extremely disrespectful with RN, unwilling to have civil conversation with RN regarding discharge, questions.  Pt unwilling to have vitals rechecked or sign for discharge.

## 2020-08-02 NOTE — ED Notes (Signed)
Pt reports some CP last night and today that was sharp in nature. Pt reports some SOB as well and swelling in his feet. Pt reports recently admitted for the same symptoms and was discharged home but is no better.

## 2020-08-02 NOTE — ED Provider Notes (Signed)
[facility] Emergency Department Provider Note ____________________________________________   First MD Initiated Contact with Patient 08/02/20 1547     (approximate)  I have reviewed the triage vital signs and the nursing notes.  HISTORY  Chief Complaint Chest Pain   HPI Joshua [name] is a 49 y.o. malewho presents to the ED for evaluation of chest pain.   Chart review indicates recent admission 8/2-8/7 for acute respiratory failure attributed to CHF and pneumonia.  Known EF 20-25%.  History of CAD status post PCI.  DM on metformin.  Patient reports not receiving prescriptions after his recent admission, not having his home prescriptions for the past 2 weeks since this admission.  During this timeframe, patient reports increasing lower extremity edema, orthopnea and intermittent left-sided chest pains.  Here in the ED, patient reports feeling well while sitting up, but reports 3/10 aching to his bilateral ankles that he attributes to his swelling.  His pain has been constant over a matter of days, worsening, and he has not taken home medications to relieve this. Regarding his chest pain, patient denies any chest pain now, but reports intermittently having left-sided sharp chest pain that is 4/10 intensity, fleeting lasting matter of seconds and self resolving. Patient reports constipation, last stool output yesterday and "hard and difficult to get out."   Patient denies any recent fevers, headache, syncope, trauma, productive cough, Covid exposures.  Patient reports living locally, but having difficult social situation in terms of finding a job and having steady income.  Reports his water was transiently turned off previously.  Past Medical History:  Diagnosis Date  . Diabetes mellitus without complication [facility])     Patient Active Problem List   Diagnosis Date Noted  . Acute respiratory failure with hypoxia ([place]) 07/15/2020  . Multifocal pneumonia  07/15/2020  . Type 2 diabetes mellitus without complication ([place]) 35/57/3220  . History of MI (myocardial infarction) 07/15/2020  . Symptomatic anemia 07/15/2020  . Acute ST elevation myocardial infarction (STEMI) involving left anterior descending (LAD) coronary artery ([place]) 09/09/2018  . STEMI involving left anterior descending coronary artery ([place]) 09/09/2018  . Hypoglycemia 06/02/2016    Past Surgical History:  Procedure Laterality Date  . CORONARY/GRAFT ACUTE MI REVASCULARIZATION N/A 09/09/2018   Procedure: Coronary/Graft Acute MI Revascularization;  Surgeon: Yolonda Kida, MD;  Location: [place] [facility];  Service: Cardiovascular;  Laterality: N/A;  . LEFT HEART CATH AND CORONARY ANGIOGRAPHY N/A 09/09/2018   Procedure: LEFT HEART CATH AND CORONARY ANGIOGRAPHY;  Surgeon: Yolonda Kida, MD;  Location: [place] [facility];  Service: Cardiovascular;  Laterality: N/A;  . none      Prior to Admission medications   Medication Sig Start Date End Date Taking? Authorizing Provider  aspirin EC 81 MG EC tablet Take 1 tablet (81 mg total) by mouth daily. Patient not taking: Reported on 07/16/2020 09/11/18   Demetrios Loll, MD  atorvastatin (LIPITOR) 80 MG tablet Take 1 tablet (80 mg total) by mouth daily at 6 PM. Patient not taking: Reported on 07/16/2020 09/10/18   Demetrios Loll, MD  furosemide (LASIX) 20 MG tablet Take by mouth. Patient not taking: Reported on 07/16/2020 02/14/19   [provider]  furosemide (LASIX) 20 MG tablet Take 1 tablet (20 mg total) by mouth daily. 08/02/20 08/02/21  Vladimir Crofts, MD  lisinopril (PRINIVIL,ZESTRIL) 5 MG tablet Take 1 tablet (5 mg total) by mouth daily. Patient not taking: Reported on 07/16/2020 09/10/18   Demetrios Loll, MD  metFORMIN (GLUCOPHAGE)  500 MG tablet Take 1 tablet (500 mg total) by mouth 2 (two) times daily with a meal. Patient not taking: Reported on 07/16/2020 09/10/18 07/16/20  Demetrios Loll, MD  metFORMIN (GLUCOPHAGE) 500 MG tablet Take 1 tablet  (500 mg total) by mouth daily with breakfast. 08/02/20 10/01/20  Vladimir Crofts, MD  metoprolol succinate (TOPROL XL) 100 MG 24 hr tablet Take 1 tablet (100 mg total) by mouth daily. Take with or immediately following a meal. 08/02/20 09/01/20  Vladimir Crofts, MD  metoprolol succinate (TOPROL-XL) 100 MG 24 hr tablet Take 100 mg by mouth daily. Take with or immediately following a meal. Patient not taking: Reported on 07/16/2020    [provider]  nitroGLYCERIN (NITROSTAT) 0.4 MG SL tablet Place 1 tablet (0.4 mg total) under the tongue every 5 (five) minutes x 3 doses as needed for chest pain. Patient not taking: Reported on 07/16/2020 09/10/18   Demetrios Loll, MD  spironolactone (ALDACTONE) 25 MG tablet Take 25 mg by mouth daily. Patient not taking: Reported on 07/16/2020    [provider]    Allergies Penicillins  No family history on file.  Social History Social History   Tobacco Use  . Smoking status: Current Every Day Smoker  . Smokeless tobacco: Never Used  Vaping Use  . Vaping Use: Never used  Substance Use Topics  . Alcohol use: Yes    Alcohol/week: 0.0 standard drinks  . Drug use: Not Currently    Comment: pt states no found unknown white substance in mouth    Review of Systems  Constitutional: No fever/chills Eyes: No visual changes. ENT: No sore throat. Cardiovascular: Positive for chest pain Respiratory: Positive shortness of breath and orthopnea Gastrointestinal: No abdominal pain.  No nausea, no vomiting.  No diarrhea.  Positive for constipation Genitourinary: Negative for dysuria. Musculoskeletal: Negative for back pain. Skin: Negative for rash. Neurological: Negative for headaches, focal weakness or numbness.   ____________________________________________   PHYSICAL EXAM:  VITAL SIGNS: Vitals:   08/02/20 1400 08/02/20 1636  BP: 138/74 121/86  Pulse: 79 72  Resp: 19   Temp: 97.9 F (36.6 C)   SpO2: 100%       Constitutional: Alert and  oriented. Well appearing and in no acute distress.  Sitting up in bed, well-appearing and conversational full sentences. Eyes: Conjunctivae are normal. PERRL. EOMI. Head: Atraumatic. Nose: No congestion/rhinnorhea. Mouth/Throat: Mucous membranes are moist.  Oropharynx non-erythematous. Neck: No stridor. No cervical spine tenderness to palpation. Cardiovascular: Normal rate, regular rhythm. Grossly normal heart sounds.  Good peripheral circulation. Respiratory: Normal respiratory effort.  No retractions. Lungs CTAB. Gastrointestinal: Soft , nondistended, nontender to palpation. No abdominal bruits. No CVA tenderness. Musculoskeletal: No lower extremity tenderness.  No joint effusions. No signs of acute trauma. Mild pitting edema to bilateral lower extremities up to mid shin symmetrically, no evidence of overlying trauma or skin changes. Neurologic:  Normal speech and language. No gross focal neurologic deficits are appreciated. No gait instability noted. Cranial nerves II through XII intact 5/5 strength and sensation in all 4 extremities Skin:  Skin is warm, dry and intact. No rash noted. Psychiatric: Mood and affect are normal. Speech and behavior are normal.  ____________________________________________   LABS (all labs ordered are listed, but only abnormal results are displayed)  Labs Reviewed  CBC WITH DIFFERENTIAL/PLATELET - Abnormal; Notable for the following components:      Result Value   RBC 3.29 (*)    Hemoglobin 7.8 (*)    HCT 25.5 (*)  MCV 77.5 (*)    MCH 23.7 (*)    All other components within normal limits  COMPREHENSIVE METABOLIC PANEL - Abnormal; Notable for the following components:   Glucose, Bld 159 (*)    Calcium 8.2 (*)    Albumin 3.3 (*)    All other components within normal limits  GLUCOSE, CAPILLARY - Abnormal; Notable for the following components:   Glucose-Capillary 150 (*)    All other components within normal limits  TROPONIN I (HIGH SENSITIVITY)   TROPONIN I (HIGH SENSITIVITY)   ____________________________________________  12 Lead EKG  Sinus rhythm, rate of 85 bpm, left axis, normal intervals.  No evidence of acute ischemia. ____________________________________________  RADIOLOGY  ED MD interpretation: 2 view CXR with cardiomegaly, with mild pulmonary vascular congestion.  Official radiology report(s): DG Chest 2 View  Result Date: 08/02/2020 CLINICAL DATA:  Chest pain. EXAM: CHEST - 2 VIEW COMPARISON:  07/15/2020. FINDINGS: Mediastinum hilar structures normal. Cardiomegaly. No pulmonary venous congestion. Bilateral interstitial prominence with significant improvement from prior study of 07/15/2020 most consistent with resolving pneumonitis and or edema. No pleural effusion or pneumothorax. Multiple air-filled loops of bowel noted in the abdomen. Abdominal series can be obtained to further evaluate. No acute bony abnormality. IMPRESSION: 1. Cardiomegaly. No pulmonary venous congestion. 2. Bilateral interstitial prominence with significant improvement from prior study of 07/15/2020 most consistent with resolving pneumonitis and or edema. 3. Multiple air-filled loops of bowel noted in the abdomen. Abdominal series can be obtained to further evaluate. Electronically Signed   By: Marcello Moores  Register   On: 08/02/2020 05:30    ____________________________________________   PROCEDURES and INTERVENTIONS  Procedure(s) performed (including Critical Care):  Procedures  Medications  furosemide (LASIX) injection 40 mg (40 mg Intravenous Given 08/02/20 1622)  metoprolol succinate (TOPROL-XL) 24 hr tablet 100 mg (100 mg Oral Given 08/02/20 1636)  aspirin EC tablet 81 mg (81 mg Oral Given 08/02/20 1636)  metFORMIN (GLUCOPHAGE) tablet 500 mg (500 mg Oral Given 08/02/20 1636)  atorvastatin (LIPITOR) tablet 80 mg (80 mg Oral Given 08/02/20 1636)    ____________________________________________   INITIAL IMPRESSION / ASSESSMENT AND PLAN / ED  COURSE  49 year old male with known systolic CHF presents with atypical chest pains and evidence of volume overload in the setting of not having home medications, amenable to outpatient management.  Normal vital signs on room air.  Exam with evidence of mild volume overload with edematous lower extremities, but no evidence of respiratory distress or anasarca.  Patient is well-appearing and conversational in full sentences.  Blood work demonstrates chronic anemia, and without evidence of acute pathology.  CXR demonstrates pulmonary vascular congestion.  Provided patient single dose of IV Lasix, with good urinary output and improvement of his peripheral swelling and orthopnea.  Further provided home doses of his home medications.  Patient reports not having his medications at home due to a mixup at discharge from his recent hospitalization.  After his improving clinical status after initiating IV diuresis, I see no reason that the patient has been hospitalized.  Patient reports having a safe place to go outside the hospital.  I provide him a 30-day prescriptions of his most pertinent medications, and advised him to pick up baby aspirin at the pharmacy when he picks up these prescriptions.  We discussed outpatient management and return precautions for the ED.  Patient medically stable for discharge home.  Clinical Course as of Aug 02 2013  Fri Aug 02, 2020  1600 I walk the patient to the restroom  to void.  He ambulates with a normal gait independently.   [DS]  1911 Reassessed.  Patient reports improving leg swelling and respiratory status.  I advised patient that he is safe for outpatient management of his condition.  He expresses understanding agreement.  Answered questions.  We discussed return precautions for the ED, outpatient management of his chronic illnesses, administration of his home meds and to pick up his new prescriptions that I'll provide him.  He is requesting additional sandwich before he goes.    [DS]    Clinical Course User Index [DS] Vladimir Crofts, MD     ____________________________________________   FINAL CLINICAL IMPRESSION(S) / ED DIAGNOSES  Final diagnoses:  Other chest pain  Coronary artery disease involving native coronary artery of native heart without angina pectoris  Lower extremity edema  Acute on chronic systolic congestive heart failure [facility])     ED Discharge Orders         Ordered    furosemide (LASIX) 20 MG tablet  Daily        08/02/20 1914    metFORMIN (GLUCOPHAGE) 500 MG tablet  Daily with breakfast        08/02/20 1914    metoprolol succinate (TOPROL XL) 100 MG 24 hr tablet  Daily        08/02/20 1914           Rosa Gambale Tamala Julian   Note:  This document was prepared using Dragon voice recognition software and may include unintentional dictation errors.   Vladimir Crofts, MD 08/02/20 2023

## 2020-08-02 NOTE — ED Triage Notes (Signed)
Pt to triage via w/c with no distress noted, brought in by EMS from home for c/o left sided CP x hr radiating into neck; swelling to ankles, out of lasix x mo and metformin x wk

## 2020-08-02 NOTE — ED Notes (Signed)
Pt provided OJ as requested

## 2020-09-28 ENCOUNTER — Emergency Department
Admission: EM | Admit: 2020-09-28 | Discharge: 2020-09-29 | Disposition: A | Discharge status: Home / Self Care | Payer: Medicaid Other | Attending: Emergency Medicine | Admitting: Emergency Medicine

## 2020-09-28 ENCOUNTER — Emergency Department: Payer: Medicaid Other

## 2020-09-28 ENCOUNTER — Other Ambulatory Visit: Payer: Self-pay

## 2020-09-28 DIAGNOSIS — Z20822 Contact with and (suspected) exposure to covid-19: Secondary | ICD-10-CM | POA: Insufficient documentation

## 2020-09-28 DIAGNOSIS — F17200 Nicotine dependence, unspecified, uncomplicated: Secondary | ICD-10-CM | POA: Insufficient documentation

## 2020-09-28 DIAGNOSIS — Z79899 Other long term (current) drug therapy: Secondary | ICD-10-CM | POA: Insufficient documentation

## 2020-09-28 DIAGNOSIS — E11649 Type 2 diabetes mellitus with hypoglycemia without coma: Secondary | ICD-10-CM | POA: Insufficient documentation

## 2020-09-28 DIAGNOSIS — I110 Hypertensive heart disease with heart failure: Secondary | ICD-10-CM | POA: Diagnosis not present

## 2020-09-28 DIAGNOSIS — Z7982 Long term (current) use of aspirin: Secondary | ICD-10-CM | POA: Diagnosis not present

## 2020-09-28 DIAGNOSIS — R0602 Shortness of breath: Secondary | ICD-10-CM | POA: Diagnosis present

## 2020-09-28 DIAGNOSIS — I509 Heart failure, unspecified: Secondary | ICD-10-CM

## 2020-09-28 DIAGNOSIS — Z7984 Long term (current) use of oral hypoglycemic drugs: Secondary | ICD-10-CM | POA: Insufficient documentation

## 2020-09-28 HISTORY — DX: Heart failure, unspecified: I50.9

## 2020-09-28 HISTORY — DX: Essential (primary) hypertension: I10

## 2020-09-28 LAB — CBC
HCT: 29 % — ABNORMAL LOW (ref 39.0–52.0)
Hemoglobin: 8.3 g/dL — ABNORMAL LOW (ref 13.0–17.0)
MCH: 21.4 pg — ABNORMAL LOW (ref 26.0–34.0)
MCHC: 28.6 g/dL — ABNORMAL LOW (ref 30.0–36.0)
MCV: 74.9 fL — ABNORMAL LOW (ref 80.0–100.0)
Platelets: 306 10*3/uL (ref 150–400)
RBC: 3.87 MIL/uL — ABNORMAL LOW (ref 4.22–5.81)
RDW: 17.5 % — ABNORMAL HIGH (ref 11.5–15.5)
WBC: 8 10*3/uL (ref 4.0–10.5)
nRBC: 0 % (ref 0.0–0.2)

## 2020-09-28 LAB — BRAIN NATRIURETIC PEPTIDE: B Natriuretic Peptide: 947.6 pg/mL — ABNORMAL HIGH (ref 0.0–100.0)

## 2020-09-28 LAB — TROPONIN I (HIGH SENSITIVITY): Troponin I (High Sensitivity): 7 ng/L (ref <18)

## 2020-09-28 LAB — BASIC METABOLIC PANEL
Anion gap: 7 (ref 5–15)
BUN: 16 mg/dL (ref 6–20)
CO2: 24 mmol/L (ref 22–32)
Calcium: 8.3 mg/dL — ABNORMAL LOW (ref 8.9–10.3)
Chloride: 108 mmol/L (ref 98–111)
Creatinine, Ser: 1.15 mg/dL (ref 0.61–1.24)
GFR, Estimated: >60 mL/min (ref >60)
Glucose, Bld: 200 mg/dL — ABNORMAL HIGH (ref 70–99)
Potassium: 4.4 mmol/L (ref 3.5–5.1)
Sodium: 139 mmol/L (ref 135–145)

## 2020-09-28 IMAGING — CR DG CHEST 2V
2 series · 2 of 2 positions shown · non-contrast
Comparison: [DATE]

CLINICAL DATA: Shortness of breath.  History of CHF.

EXAM:
CHEST - 2 VIEW

[chest pa]
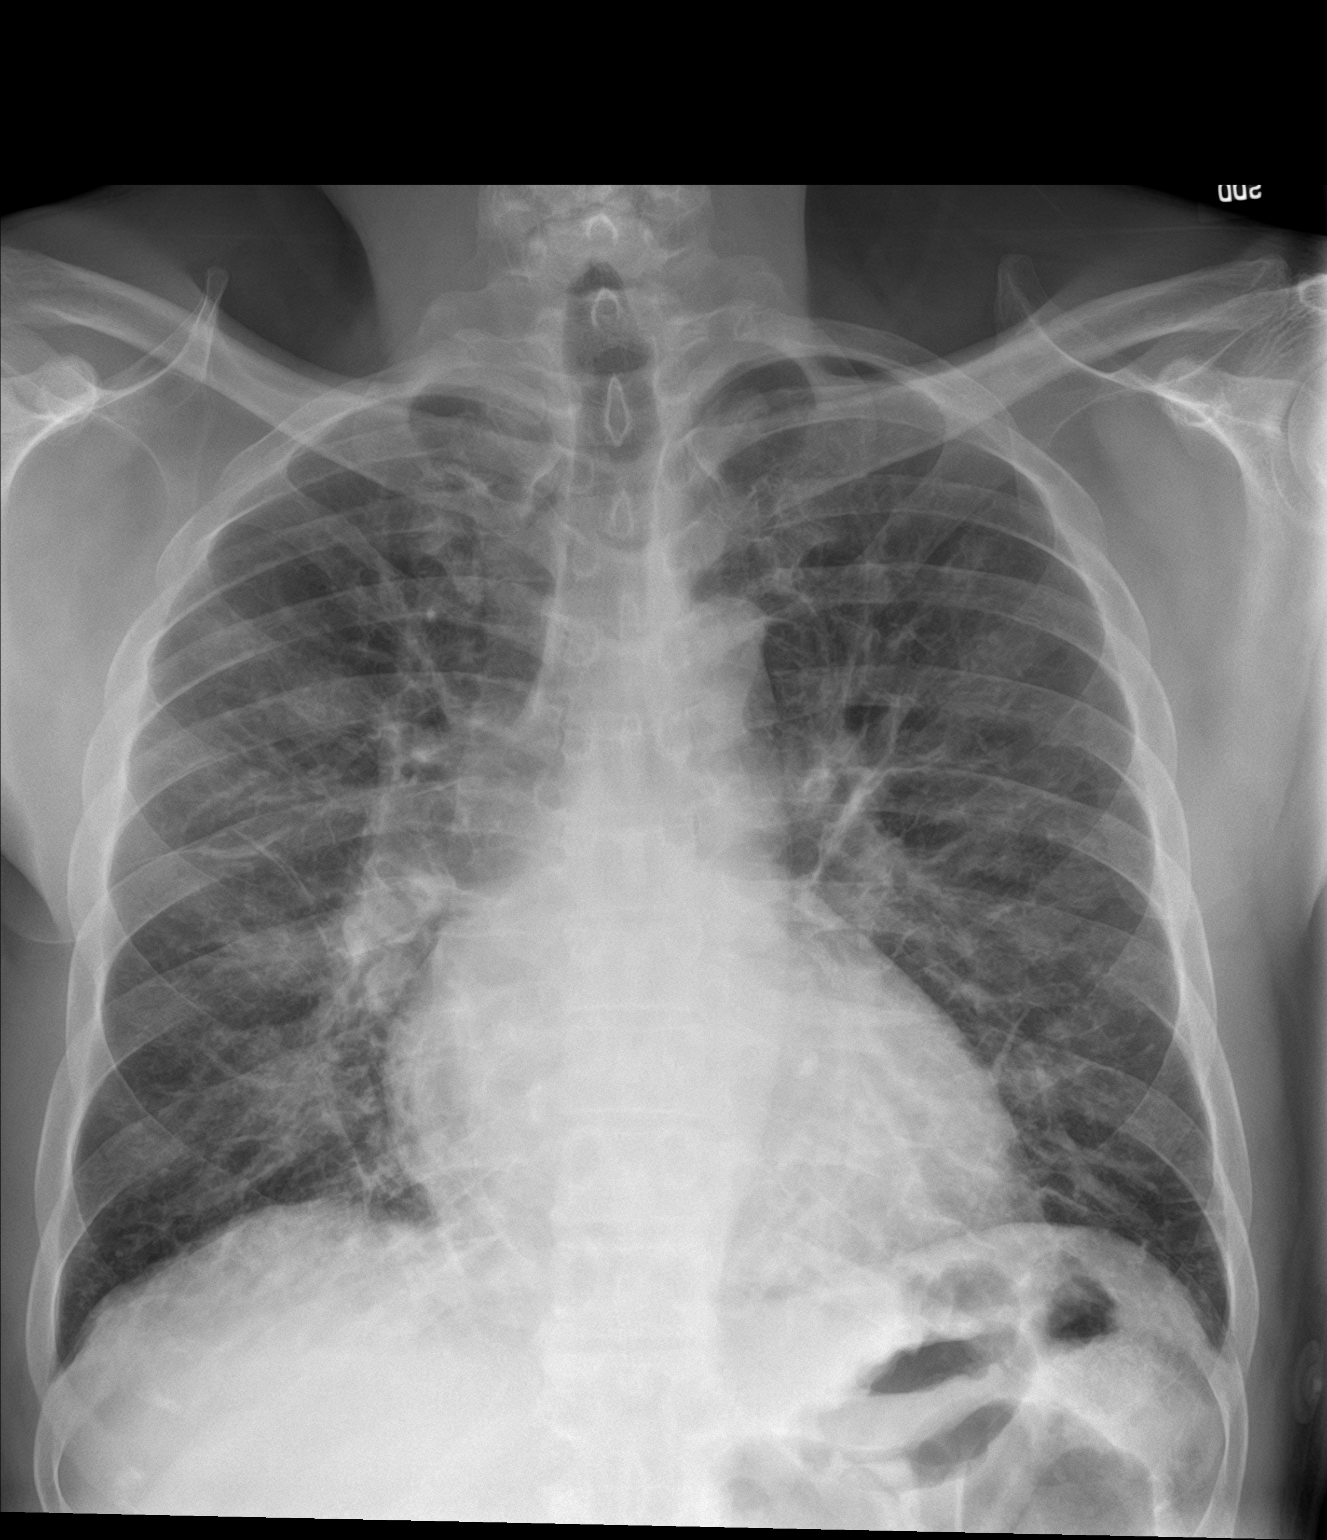

[chest lat]
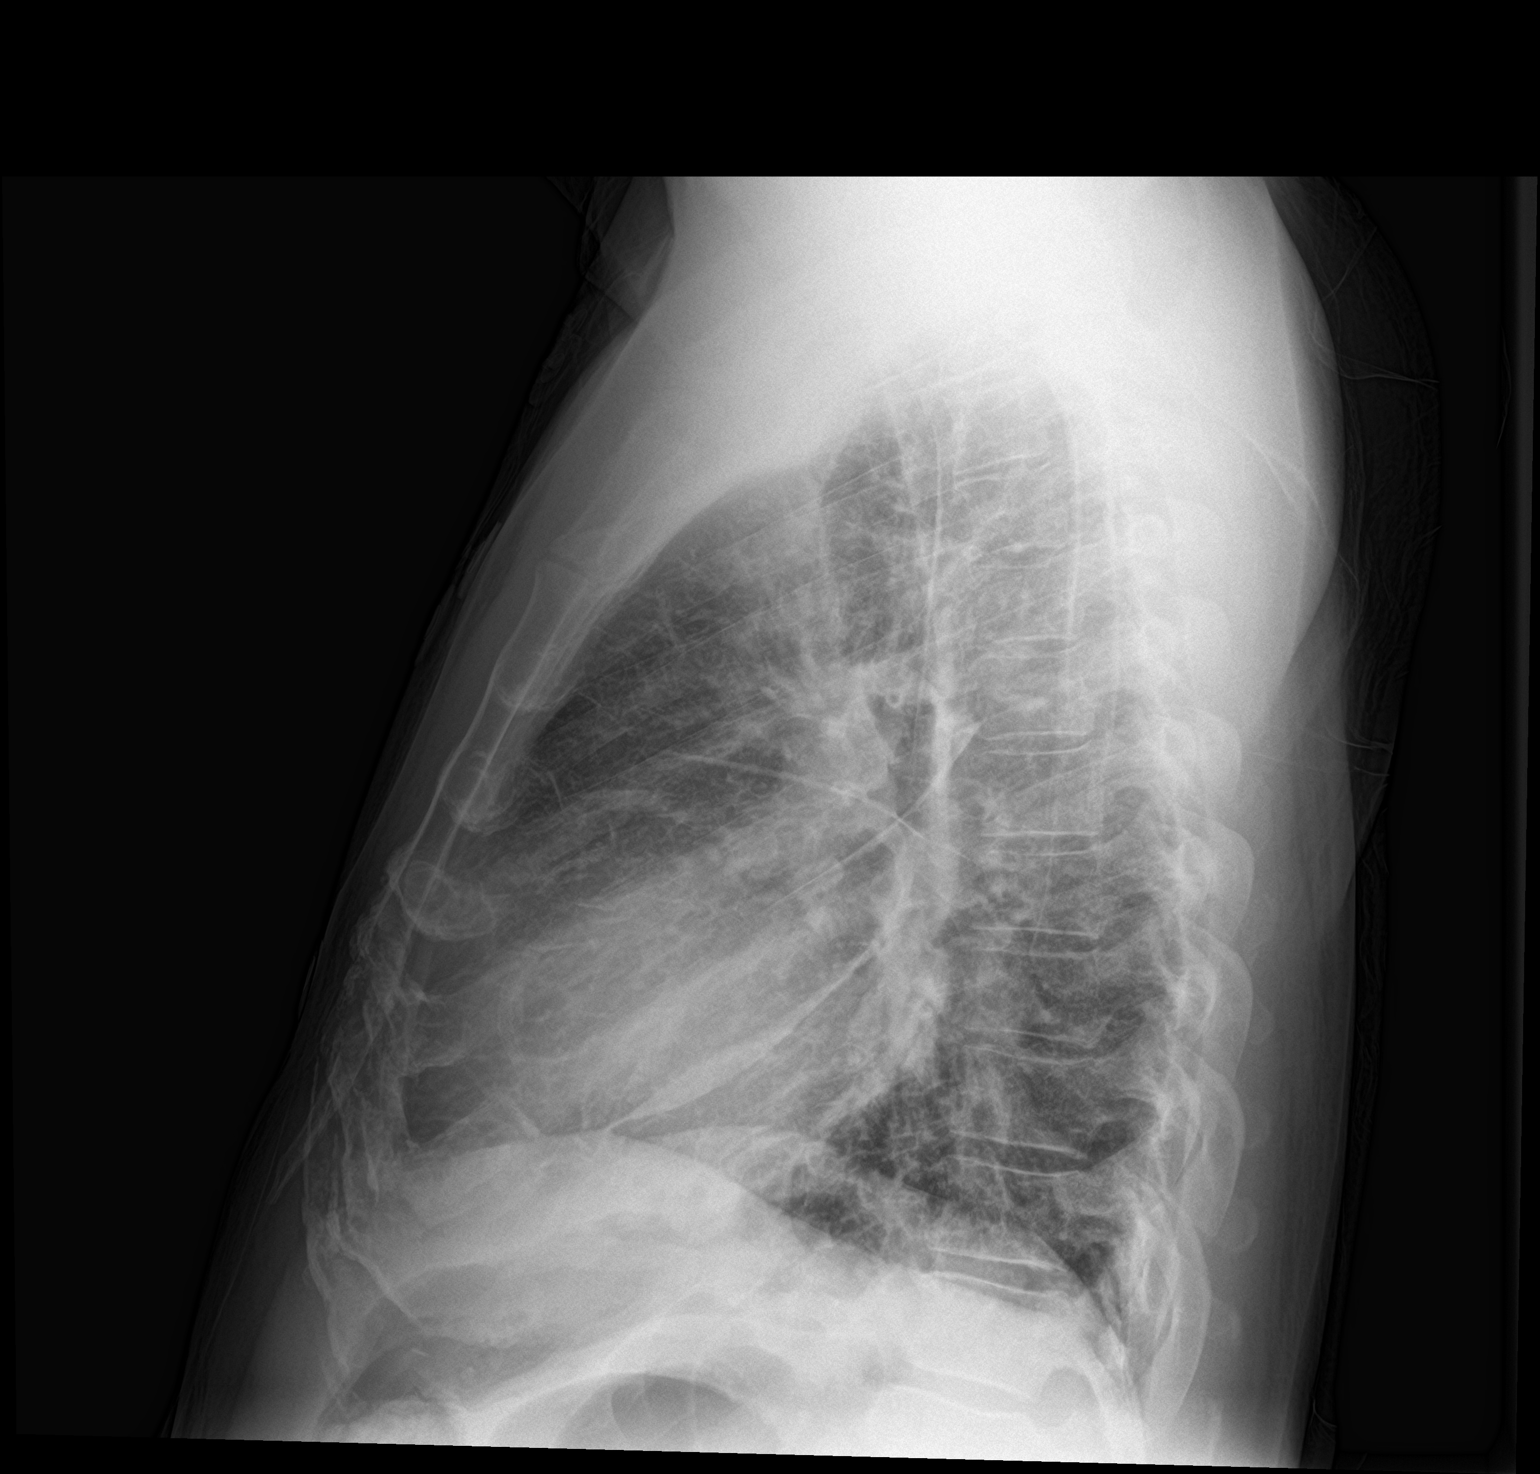

[2 of 2 positions shown; findings below may reference images not displayed]

FINDINGS: The cardiac silhouette remains enlarged. The lungs are well
inflated. The interstitial markings remain diffusely increased, and
there is increased thickening or trace fluid along the fissures.
There are also mildly increased hazy airspace opacities in the right
greater than left perihilar regions. No layering pleural effusion or
pneumothorax is identified. No acute osseous abnormality is seen.
IMPRESSION: Cardiomegaly with increased interstitial and hazy airspace opacities
as above likely reflecting mild edema although superimposed
infection is not excluded.

## 2020-09-28 NOTE — ED Notes (Signed)
Lab notified to add on troponin, BNP already in process.

## 2020-09-28 NOTE — ED Triage Notes (Signed)
Patient to the ED with complaints of shortness of breath. Has history of CHF and is out of his Lasix. States he dropped some on the floor when he got them and didn't have the whole prescription. Patient does not have a PCP but rather states somebody here wrote his prescription for him. Patient is able to speak in complete sentences without difficulty. Has bilateral lower extremity edema he states is worse than it usually is. Denies N/V but states he has diarrhea

## 2020-09-28 NOTE — ED Triage Notes (Signed)
FIRST NURSE NOTE: Pt here with multiple medical complaints, pt states he is supposed to be on a fluid pill and some of the pills spilled out and lost them and has been without his fluid pills for several days.

## 2020-09-28 NOTE — ED Provider Notes (Signed)
[facility] Emergency Department Provider Note   ____________________________________________   I have reviewed the triage vital signs and the nursing notes.   HISTORY  Chief Complaint Fluid build up  History limited by: Not Limited   HPI Joshua [name] is a 49 y.o. male who presents to the emergency department today because of concern for fluid build up. The patient states he has noticed swelling to his legs and shortness of breath that started one week ago and has been progressively getting worse. He says this is due to him not having any of his fluid pills. The patient denies any associated chest pain. Says he thinks he might have had some fever on and off over the past week but has not had a way to check his temperature. Additionally he has been having some diarrhea on and off. The patient has not had any known sick contacts.    Records reviewed. Per medical record review patient has a history of CHF, DM, HTN, CAD.   Past Medical History:  Diagnosis Date   CHF (congestive heart failure) (HCC)    Diabetes mellitus without complication ([place])    Hypertension     Patient Active Problem List   Diagnosis Date Noted   Acute respiratory failure with hypoxia ([place]) 07/15/2020   Multifocal pneumonia 07/15/2020   Type 2 diabetes mellitus without complication ([place]) 32/35/5732   History of MI (myocardial infarction) 07/15/2020   Symptomatic anemia 07/15/2020   Acute ST elevation myocardial infarction (STEMI) involving left anterior descending (LAD) coronary artery ([place]) 09/09/2018   STEMI involving left anterior descending coronary artery ([place]) 09/09/2018   Hypoglycemia 06/02/2016    Past Surgical History:  Procedure Laterality Date   CORONARY/GRAFT ACUTE MI REVASCULARIZATION N/A 09/09/2018   Procedure: Coronary/Graft Acute MI Revascularization;  Surgeon: Yolonda Kida, MD;  Location: [place] [facility];  Service: Cardiovascular;   Laterality: N/A;   LEFT HEART CATH AND CORONARY ANGIOGRAPHY N/A 09/09/2018   Procedure: LEFT HEART CATH AND CORONARY ANGIOGRAPHY;  Surgeon: Yolonda Kida, MD;  Location: [place] [facility];  Service: Cardiovascular;  Laterality: N/A;   none     STENT PLACEMENT VASCULAR ([facility] HX)      Prior to Admission medications   Medication Sig Start Date End Date Taking? Authorizing Provider  aspirin EC 81 MG EC tablet Take 1 tablet (81 mg total) by mouth daily. Patient not taking: Reported on 07/16/2020 09/11/18   Demetrios Loll, MD  atorvastatin (LIPITOR) 80 MG tablet Take 1 tablet (80 mg total) by mouth daily at 6 PM. Patient not taking: Reported on 07/16/2020 09/10/18   Demetrios Loll, MD  furosemide (LASIX) 20 MG tablet Take by mouth. Patient not taking: Reported on 07/16/2020 02/14/19   [provider]  furosemide (LASIX) 20 MG tablet Take 1 tablet (20 mg total) by mouth daily. 08/02/20 08/02/21  Vladimir Crofts, MD  lisinopril (PRINIVIL,ZESTRIL) 5 MG tablet Take 1 tablet (5 mg total) by mouth daily. Patient not taking: Reported on 07/16/2020 09/10/18   Demetrios Loll, MD  metFORMIN (GLUCOPHAGE) 500 MG tablet Take 1 tablet (500 mg total) by mouth 2 (two) times daily with a meal. Patient not taking: Reported on 07/16/2020 09/10/18 07/16/20  Demetrios Loll, MD  metFORMIN (GLUCOPHAGE) 500 MG tablet Take 1 tablet (500 mg total) by mouth daily with breakfast. 08/02/20 10/01/20  Vladimir Crofts, MD  metoprolol succinate (TOPROL XL) 100 MG 24 hr tablet Take 1 tablet (100 mg total) by mouth daily. Take with or  immediately following a meal. 08/02/20 09/01/20  Vladimir Crofts, MD  metoprolol succinate (TOPROL-XL) 100 MG 24 hr tablet Take 100 mg by mouth daily. Take with or immediately following a meal. Patient not taking: Reported on 07/16/2020    [provider]  nitroGLYCERIN (NITROSTAT) 0.4 MG SL tablet Place 1 tablet (0.4 mg total) under the tongue every 5 (five) minutes x 3 doses as needed for chest pain. Patient not taking:  Reported on 07/16/2020 09/10/18   Demetrios Loll, MD  spironolactone (ALDACTONE) 25 MG tablet Take 25 mg by mouth daily. Patient not taking: Reported on 07/16/2020    [provider]    Allergies Penicillins  No family history on file.  Social History Social History   Tobacco Use   Smoking status: Current Every Day Smoker   Smokeless tobacco: Never Used  Scientific laboratory technician Use: Never used  Substance Use Topics   Alcohol use: Yes    Alcohol/week: 0.0 standard drinks   Drug use: Not Currently    Comment: pt states no found unknown white substance in mouth    Review of Systems Constitutional: No fever/chills Eyes: No visual changes. ENT: No sore throat. Cardiovascular: Denies chest pain. Respiratory: Positive for shortness of breath. Gastrointestinal: No abdominal pain.  No nausea, no vomiting.  No diarrhea.   Genitourinary: Negative for dysuria. Musculoskeletal: Positive for leg swelling, leg pain. Skin: Negative for rash. Neurological: Negative for headaches, focal weakness or numbness.  ____________________________________________   PHYSICAL EXAM:  VITAL SIGNS: ED Triage Vitals  Enc Vitals Group     BP 09/28/20 1959 (!) 130/95     Pulse Rate 09/28/20 1959 82     Resp 09/28/20 1959 20     Temp 09/28/20 1959 99 F (37.2 C)     Temp Source 09/28/20 1959 Oral     SpO2 09/28/20 1959 100 %     Weight 09/28/20 2002 180 lb (81.6 kg)     Height 09/28/20 2002 5\' 10"  (1.778 m)     Head Circumference --      Peak Flow --      Pain Score 09/28/20 2002 4    Constitutional: Alert and oriented.  Eyes: Conjunctivae are normal.  ENT      Head: Normocephalic and atraumatic.      Nose: No congestion/rhinnorhea.      Mouth/Throat: Mucous membranes are moist.      Neck: No stridor. Hematological/Lymphatic/Immunilogical: No cervical lymphadenopathy. Cardiovascular: Normal rate, regular rhythm.  No murmurs, rubs, or gallops.  Respiratory: Normal respiratory effort  without tachypnea nor retractions. Breath sounds are clear and equal bilaterally. No wheezes/rales/rhonchi. Gastrointestinal: Soft and non tender. No rebound. No guarding.  Genitourinary: Deferred Musculoskeletal: Normal range of motion in all extremities. 1+ pitting edema in bilateral legs.  Neurologic:  Normal speech and language. No gross focal neurologic deficits are appreciated.  Skin:  Skin is warm, dry and intact. No rash noted. Psychiatric: Mood and affect are normal. Speech and behavior are normal. Patient exhibits appropriate insight and judgment.  ____________________________________________    LABS (pertinent positives/negatives)  Trop hs 7 BNP 947.6 CBC wbc 8.0, hgb 8.3, plt 306 BMP wnl except glu 200, ca 8.3 ____________________________________________   EKG  I, Nance Pear, attending physician, personally viewed and interpreted this EKG  EKG Time: 2000 Rate: 72 Rhythm: normal sinus rhythm Axis: right axis deviation Intervals: qtc 442 QRS: narrow ST changes: no st elevation Impression: artifact present, no STEMI. Abnormal ekg.   ____________________________________________  RADIOLOGY  CXR Cardiomegaly with interstitial airspace opacities  ____________________________________________   PROCEDURES  Procedures  ____________________________________________   INITIAL IMPRESSION / ASSESSMENT AND PLAN / ED COURSE  Pertinent labs & imaging results that were available during my care of the patient were reviewed by me and considered in my medical decision making (see chart for details).   Patient presented to the emergency department because of concern for fluid overload. Patient did have some peripheral edema. Blood work shows elevated bnp but troponin normal. CXR consistent with edema. At this time I have low suspicion for infection given lack of fever/leukocytosis. Patient was given dose of iv lasix here with good urine output. Will plan on  discharging home with prescription for his lasix. Will give PCP and heart failure clinic follow up.   ____________________________________________   FINAL CLINICAL IMPRESSION(S) / ED DIAGNOSES  Final diagnoses:  Congestive heart failure, unspecified HF chronicity, unspecified heart failure type [facility])     Note: This dictation was prepared with Dragon dictation. Any transcriptional errors that result from this process are unintentional     Nance Pear, MD 09/29/20 [phone]

## 2020-09-29 LAB — RESPIRATORY PANEL BY RT PCR (FLU A&B, COVID)
Influenza A by PCR: NEGATIVE
Influenza B by PCR: NEGATIVE
SARS Coronavirus 2 by RT PCR: NEGATIVE

## 2020-09-29 MED ORDER — FUROSEMIDE 20 MG PO TABS: 20.0000 mg | ORAL_TABLET | Freq: Every day | ORAL | 1 refills | Status: DC

## 2020-09-29 MED ORDER — FUROSEMIDE 10 MG/ML IJ SOLN
60.0000 mg | Freq: Once | INTRAMUSCULAR | Status: AC
  Administered 2020-09-29: 60 mg via INTRAVENOUS
  Filled 2020-09-29: qty 8

## 2020-09-29 NOTE — Discharge Instructions (Addendum)
Please seek medical attention for any high fevers, chest pain, shortness of breath, change in behavior, persistent vomiting, bloody stool or any other new or concerning symptoms.  

## 2020-09-30 ENCOUNTER — Telehealth: Payer: Self-pay | Admitting: Family

## 2020-09-30 NOTE — Telephone Encounter (Signed)
Unable to reach patient as his phone was not working when I called in attempt to schedule a new patient appointment after we received a  Referral from his recent ER visit.   Brynli Ollis, NT

## 2020-10-03 ENCOUNTER — Emergency Department: Payer: Medicaid Other

## 2020-10-03 ENCOUNTER — Other Ambulatory Visit: Payer: Self-pay

## 2020-10-03 ENCOUNTER — Inpatient Hospital Stay
Admission: EM | Admit: 2020-10-03 | Discharge: 2020-10-05 | DRG: 193 | Disposition: A | Discharge status: Home / Self Care | Payer: Medicaid Other | Attending: Internal Medicine | Admitting: Internal Medicine

## 2020-10-03 DIAGNOSIS — I472 Ventricular tachycardia: Secondary | ICD-10-CM | POA: Diagnosis not present

## 2020-10-03 DIAGNOSIS — I110 Hypertensive heart disease with heart failure: Secondary | ICD-10-CM | POA: Diagnosis present

## 2020-10-03 DIAGNOSIS — F17200 Nicotine dependence, unspecified, uncomplicated: Secondary | ICD-10-CM | POA: Diagnosis present

## 2020-10-03 DIAGNOSIS — R509 Fever, unspecified: Secondary | ICD-10-CM

## 2020-10-03 DIAGNOSIS — R739 Hyperglycemia, unspecified: Secondary | ICD-10-CM

## 2020-10-03 DIAGNOSIS — I4901 Ventricular fibrillation: Secondary | ICD-10-CM | POA: Diagnosis not present

## 2020-10-03 DIAGNOSIS — J9601 Acute respiratory failure with hypoxia: Secondary | ICD-10-CM | POA: Diagnosis present

## 2020-10-03 DIAGNOSIS — I5043 Acute on chronic combined systolic (congestive) and diastolic (congestive) heart failure: Secondary | ICD-10-CM | POA: Diagnosis present

## 2020-10-03 DIAGNOSIS — I252 Old myocardial infarction: Secondary | ICD-10-CM

## 2020-10-03 DIAGNOSIS — Z7984 Long term (current) use of oral hypoglycemic drugs: Secondary | ICD-10-CM

## 2020-10-03 DIAGNOSIS — E1140 Type 2 diabetes mellitus with diabetic neuropathy, unspecified: Secondary | ICD-10-CM | POA: Diagnosis present

## 2020-10-03 DIAGNOSIS — R4182 Altered mental status, unspecified: Secondary | ICD-10-CM

## 2020-10-03 DIAGNOSIS — Z955 Presence of coronary angioplasty implant and graft: Secondary | ICD-10-CM

## 2020-10-03 DIAGNOSIS — I2510 Atherosclerotic heart disease of native coronary artery without angina pectoris: Secondary | ICD-10-CM | POA: Diagnosis present

## 2020-10-03 DIAGNOSIS — R9431 Abnormal electrocardiogram [ECG] [EKG]: Secondary | ICD-10-CM | POA: Diagnosis present

## 2020-10-03 DIAGNOSIS — Z880 Allergy status to penicillin: Secondary | ICD-10-CM

## 2020-10-03 DIAGNOSIS — G9341 Metabolic encephalopathy: Secondary | ICD-10-CM | POA: Diagnosis present

## 2020-10-03 DIAGNOSIS — Z20822 Contact with and (suspected) exposure to covid-19: Secondary | ICD-10-CM | POA: Diagnosis present

## 2020-10-03 DIAGNOSIS — Z79899 Other long term (current) drug therapy: Secondary | ICD-10-CM

## 2020-10-03 DIAGNOSIS — J9602 Acute respiratory failure with hypercapnia: Secondary | ICD-10-CM | POA: Diagnosis present

## 2020-10-03 DIAGNOSIS — E1165 Type 2 diabetes mellitus with hyperglycemia: Secondary | ICD-10-CM | POA: Diagnosis present

## 2020-10-03 DIAGNOSIS — F1410 Cocaine abuse, uncomplicated: Secondary | ICD-10-CM | POA: Diagnosis present

## 2020-10-03 DIAGNOSIS — Z9119 Patient's noncompliance with other medical treatment and regimen: Secondary | ICD-10-CM

## 2020-10-03 DIAGNOSIS — Z5900 Homelessness unspecified: Secondary | ICD-10-CM

## 2020-10-03 DIAGNOSIS — D649 Anemia, unspecified: Secondary | ICD-10-CM | POA: Diagnosis present

## 2020-10-03 DIAGNOSIS — R6510 Systemic inflammatory response syndrome (SIRS) of non-infectious origin without acute organ dysfunction: Secondary | ICD-10-CM | POA: Diagnosis present

## 2020-10-03 DIAGNOSIS — J189 Pneumonia, unspecified organism: Principal | ICD-10-CM | POA: Diagnosis present

## 2020-10-03 DIAGNOSIS — Z7982 Long term (current) use of aspirin: Secondary | ICD-10-CM

## 2020-10-03 LAB — CBC WITH DIFFERENTIAL/PLATELET
Abs Immature Granulocytes: 0.03 10*3/uL (ref 0.00–0.07)
Basophils Absolute: 0 10*3/uL (ref 0.0–0.1)
Basophils Relative: 0 %
Eosinophils Absolute: 0.3 10*3/uL (ref 0.0–0.5)
Eosinophils Relative: 4 %
HCT: 29.1 % — ABNORMAL LOW (ref 39.0–52.0)
Hemoglobin: 8.3 g/dL — ABNORMAL LOW (ref 13.0–17.0)
Immature Granulocytes: 0 %
Lymphocytes Relative: 17 %
Lymphs Abs: 1.3 10*3/uL (ref 0.7–4.0)
MCH: 21.3 pg — ABNORMAL LOW (ref 26.0–34.0)
MCHC: 28.5 g/dL — ABNORMAL LOW (ref 30.0–36.0)
MCV: 74.6 fL — ABNORMAL LOW (ref 80.0–100.0)
Monocytes Absolute: 0.6 10*3/uL (ref 0.1–1.0)
Monocytes Relative: 8 %
Neutro Abs: 5.3 10*3/uL (ref 1.7–7.7)
Neutrophils Relative %: 71 %
Platelets: 273 10*3/uL (ref 150–400)
RBC: 3.9 MIL/uL — ABNORMAL LOW (ref 4.22–5.81)
RDW: 17.6 % — ABNORMAL HIGH (ref 11.5–15.5)
WBC: 7.5 10*3/uL (ref 4.0–10.5)
nRBC: 0 % (ref 0.0–0.2)

## 2020-10-03 LAB — COMPREHENSIVE METABOLIC PANEL
ALT: 31 U/L (ref 0–44)
AST: 37 U/L (ref 15–41)
Albumin: 3.1 g/dL — ABNORMAL LOW (ref 3.5–5.0)
Alkaline Phosphatase: 105 U/L (ref 38–126)
Anion gap: 8 (ref 5–15)
BUN: 12 mg/dL (ref 6–20)
CO2: 24 mmol/L (ref 22–32)
Calcium: 7.7 mg/dL — ABNORMAL LOW (ref 8.9–10.3)
Chloride: 102 mmol/L (ref 98–111)
Creatinine, Ser: 1.17 mg/dL (ref 0.61–1.24)
GFR, Estimated: >60 mL/min (ref >60)
Glucose, Bld: 223 mg/dL — ABNORMAL HIGH (ref 70–99)
Potassium: 4.4 mmol/L (ref 3.5–5.1)
Sodium: 134 mmol/L — ABNORMAL LOW (ref 135–145)
Total Bilirubin: 0.6 mg/dL (ref 0.3–1.2)
Total Protein: 6.3 g/dL — ABNORMAL LOW (ref 6.5–8.1)

## 2020-10-03 LAB — PROTIME-INR
INR: 1.1 (ref 0.8–1.2)
Prothrombin Time: 13.6 seconds (ref 11.4–15.2)

## 2020-10-03 LAB — LACTIC ACID, PLASMA: Lactic Acid, Venous: 1.4 mmol/L (ref 0.5–1.9)

## 2020-10-03 LAB — BETA-HYDROXYBUTYRIC ACID: Beta-Hydroxybutyric Acid: 0.09 mmol/L (ref 0.05–0.27)

## 2020-10-03 LAB — MAGNESIUM: Magnesium: 2.2 mg/dL (ref 1.7–2.4)

## 2020-10-03 LAB — TROPONIN I (HIGH SENSITIVITY): Troponin I (High Sensitivity): 10 ng/L (ref <18)

## 2020-10-03 LAB — AMMONIA: Ammonia: 38 umol/L — ABNORMAL HIGH (ref 9–35)

## 2020-10-03 LAB — GLUCOSE, CAPILLARY: Glucose-Capillary: 229 mg/dL — ABNORMAL HIGH (ref 70–99)

## 2020-10-03 LAB — APTT: aPTT: 31 seconds (ref 24–36)

## 2020-10-03 LAB — ETHANOL: Alcohol, Ethyl (B): <10 mg/dL (ref <10)

## 2020-10-03 IMAGING — CT CT HEAD W/O CM
4 series · 15 of 47 positions shown, 17 images · non-contrast
Comparison: [DATE]

CLINICAL DATA: Altered mental status.

EXAM:
CT HEAD WITHOUT CONTRAST
TECHNIQUE: Contiguous axial images were obtained from the base of the skull
through the vertex without intravenous contrast.

[Series 2: head bone · axial · 0.43mm/px · z∈[-143,-127]mm · 2 of 78 slices shown]
[im 8/78  bone]
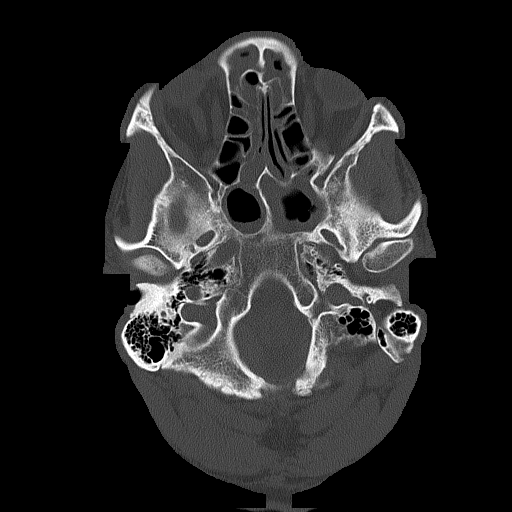
[im 16/78  bone]
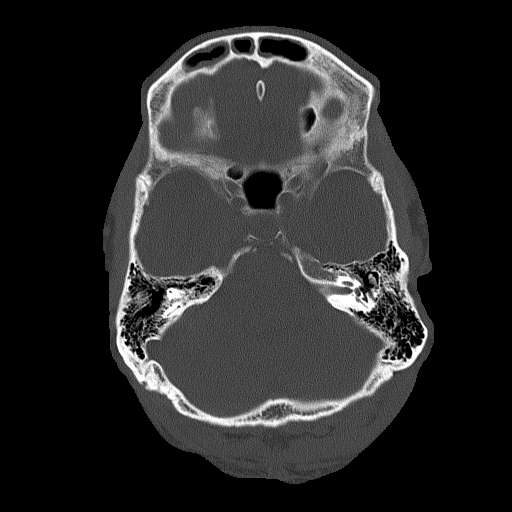

[Series 3: ax head wo · axial · 0.33mm/px · z∈[-155,-36]mm · 7 of 32 slices shown, 9 images]
[im 4/32  brain]
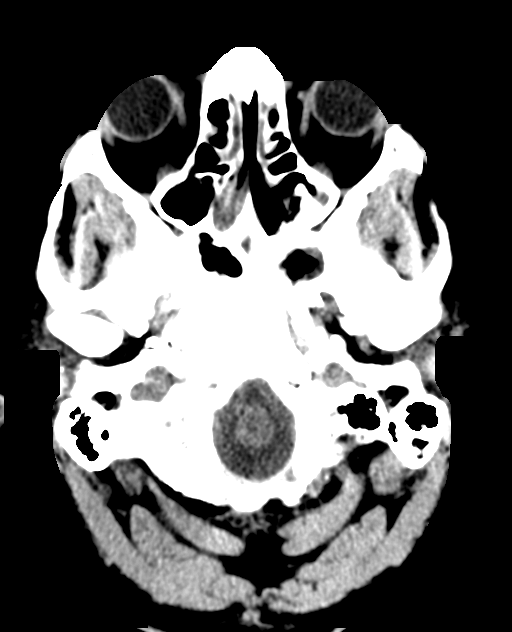
[im 4/32  bone]
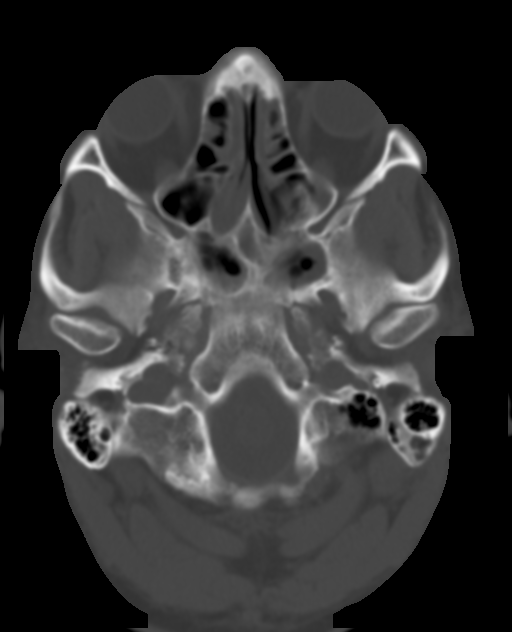
[im 8/32  brain]
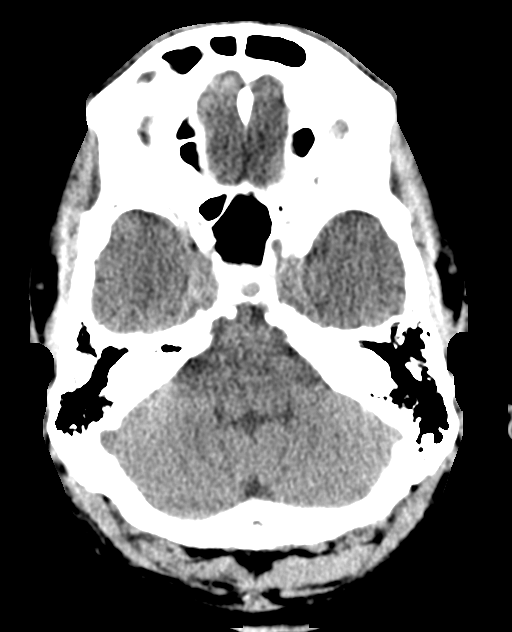
[im 12/32  brain]
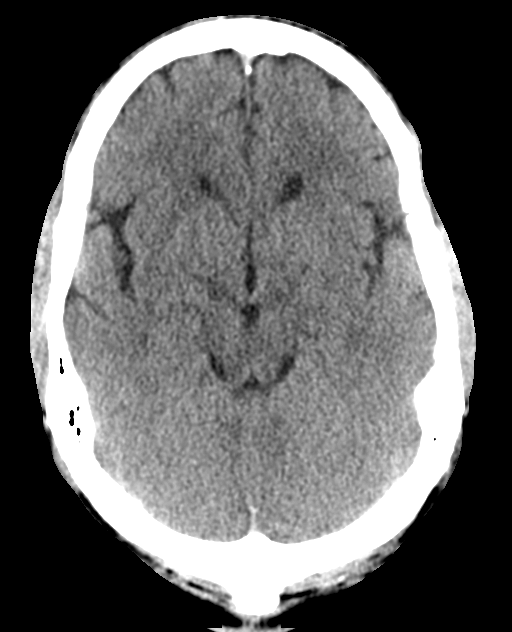
[im 16/32  brain]
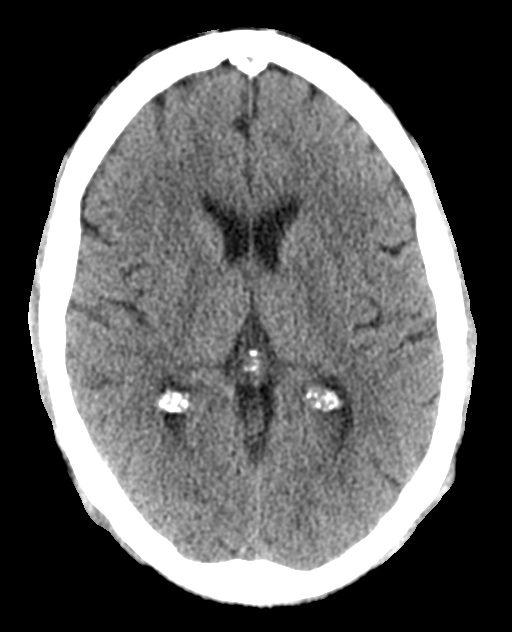
[im 20/32  brain]
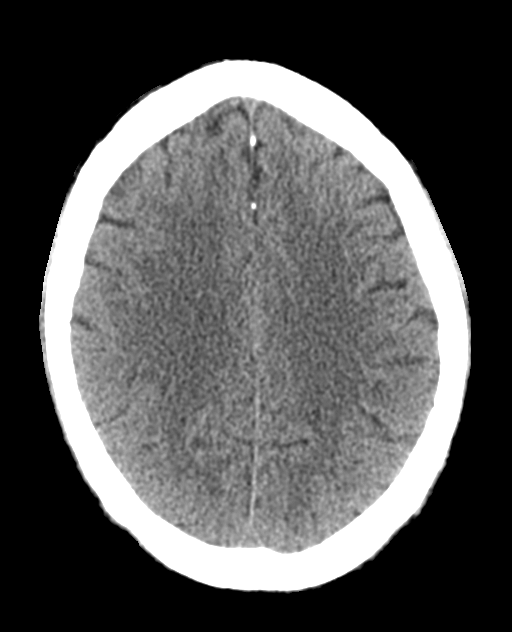
[im 20/32  bone]
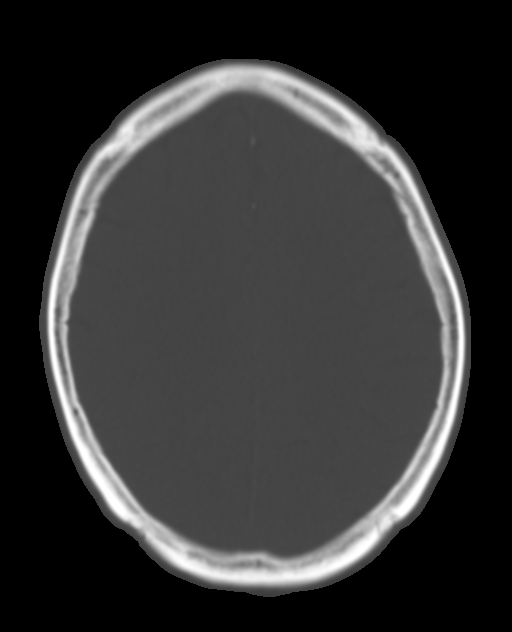
[im 24/32  brain]
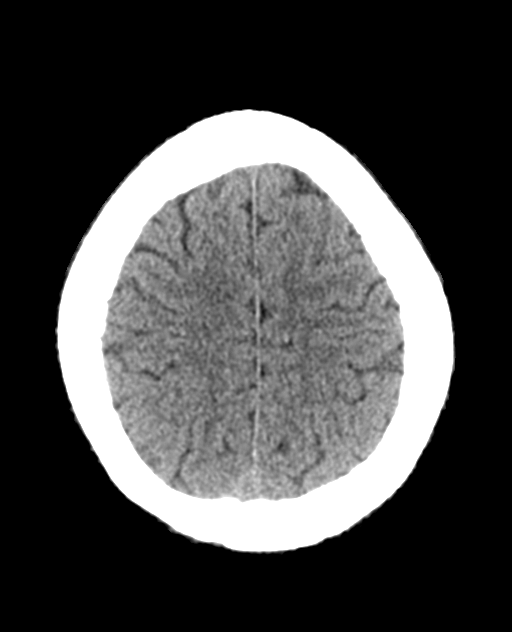
[im 28/32  brain]
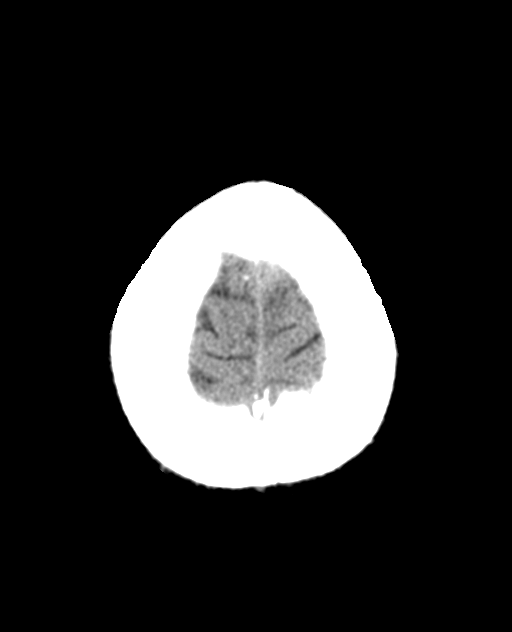

[Series 4: coronal soft tissue · coronal · 0.31mm/px · 3 of 61 slices shown]
[im 21/61  brain]
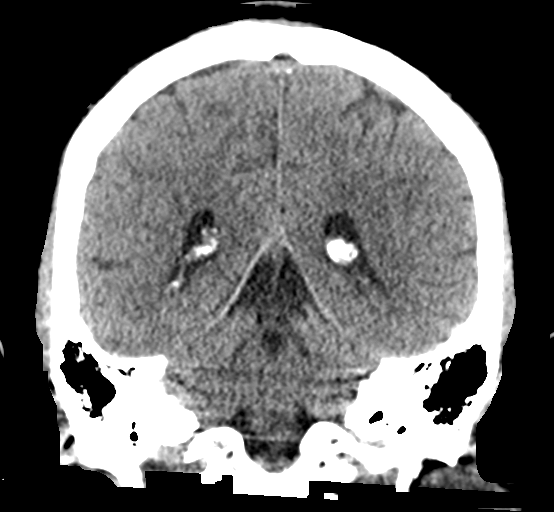
[im 27/61  brain]
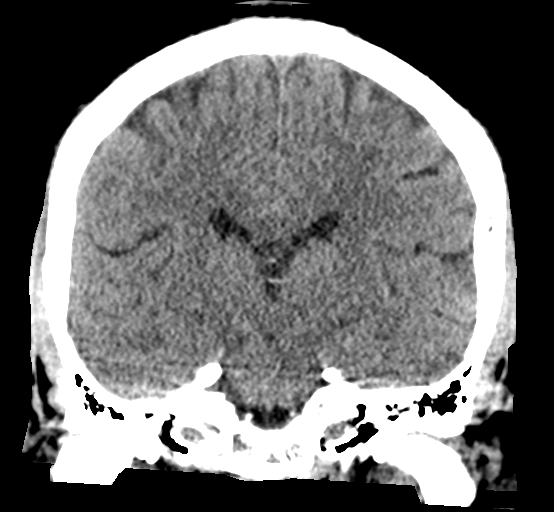
[im 34/61  brain]
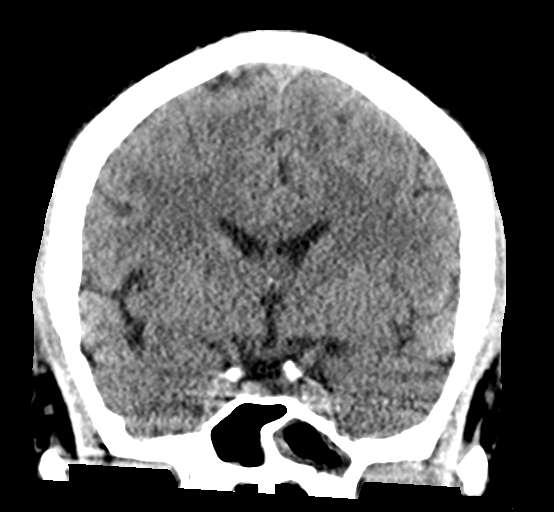

[Series 5: sagittal soft tissue · sagittal · 0.32mm/px · 3 of 49 slices shown]
[im 17/49  brain]
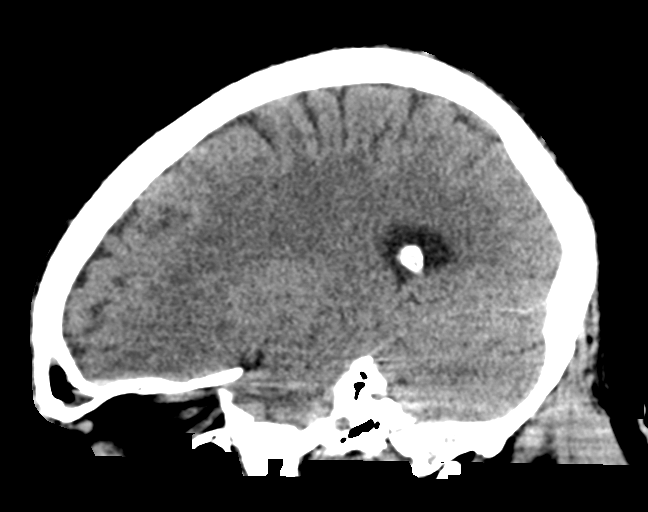
[im 25/49  brain]
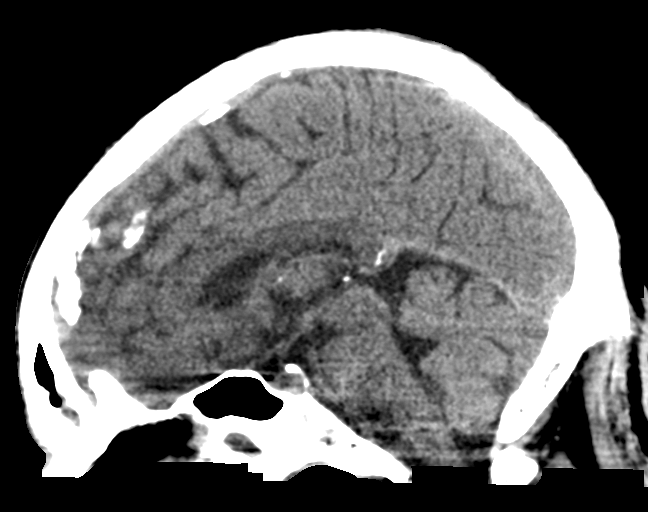
[im 33/49  brain]
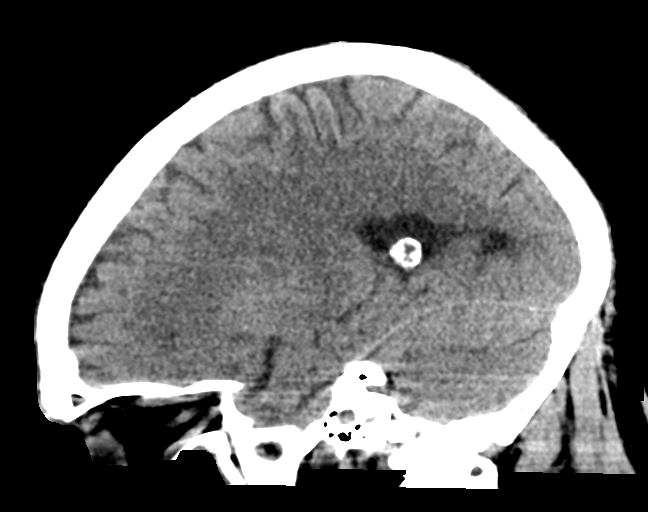

[15 of 47 positions shown; findings below may reference images not displayed]

FINDINGS: Brain: No evidence of acute infarction, hemorrhage, hydrocephalus,
extra-axial collection or mass lesion/mass effect.

Vascular: No hyperdense vessel or unexpected calcification.

Skull: Normal. Negative for fracture or focal lesion.

Sinuses/Orbits: There is marked severity bilateral ethmoid sinus and
sphenoid sinus mucosal thickening.

Other: None.
IMPRESSION: 1. No acute intracranial abnormality.
2. Marked severity bilateral ethmoid sinus and sphenoid sinus
disease.

## 2020-10-03 IMAGING — DX DG CHEST 1V PORT
1 series · 1 of 1 positions shown · non-contrast
Comparison: Chest radiographs [DATE] and earlier.

CLINICAL DATA: 48-year-old male with altered mental status,
hyperglycemia.

EXAM:
PORTABLE CHEST 1 VIEW

[chest ap]
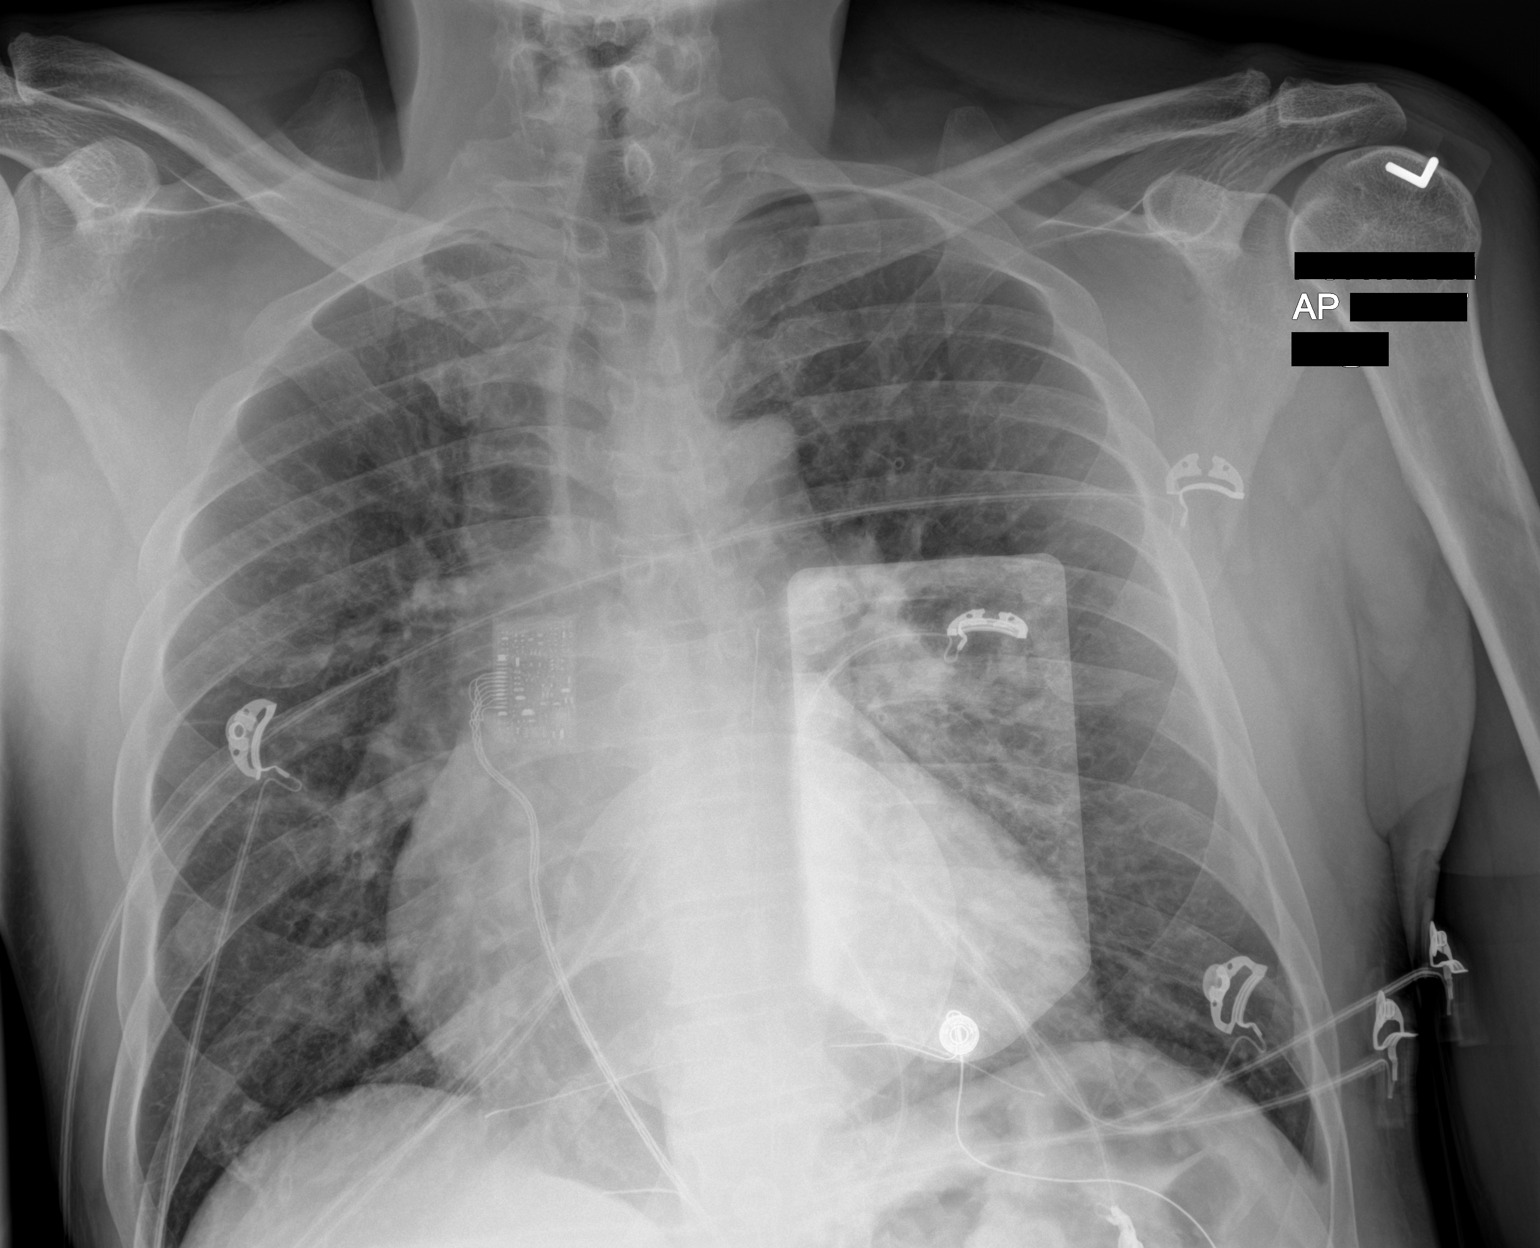

[1 of 1 positions shown; findings below may reference images not displayed]

FINDINGS: Portable AP upright view at [9O] hours. Pacer or resuscitation pads
project over the central chest. Stable cardiomegaly and mediastinal
contours. Stable lung volumes. Resolved bilateral Patchy and
confluent pulmonary opacity seen on [DATE]. Residual increased
somewhat coarse interstitial markings in both lungs appear chronic,
although probably not significantly changed since [9O]. No
superimposed pneumothorax, pulmonary edema, pleural effusion or
confluent pulmonary opacity.

No acute osseous abnormality identified. Negative visible bowel gas
pattern.
IMPRESSION: Stable.  No acute cardiopulmonary abnormality.

## 2020-10-03 MED ORDER — SODIUM CHLORIDE 0.9 % IV SOLN
1.0000 g | Freq: Once | INTRAVENOUS | Status: AC
  Administered 2020-10-03: 1 g via INTRAVENOUS
  Filled 2020-10-03: qty 10

## 2020-10-03 MED ORDER — MAGNESIUM SULFATE 2 GM/50ML IV SOLN: 2.0000 g | Freq: Once | INTRAVENOUS | Status: AC

## 2020-10-03 MED ORDER — SODIUM CHLORIDE 0.9 % IV BOLUS
500.0000 mL | Freq: Once | INTRAVENOUS | Status: AC
  Administered 2020-10-04: 500 mL via INTRAVENOUS

## 2020-10-03 MED ORDER — INSULIN ASPART 100 UNIT/ML ~~LOC~~ SOLN
0.0000 [IU] | SUBCUTANEOUS | Status: DC
  Administered 2020-10-04: 2 [IU] via SUBCUTANEOUS
  Administered 2020-10-04: 5 [IU] via SUBCUTANEOUS
  Administered 2020-10-04: 2 [IU] via SUBCUTANEOUS
  Administered 2020-10-04: 8 [IU] via SUBCUTANEOUS
  Administered 2020-10-04: 5 [IU] via SUBCUTANEOUS
  Administered 2020-10-05: 8 [IU] via SUBCUTANEOUS
  Administered 2020-10-05: 3 [IU] via SUBCUTANEOUS
  Administered 2020-10-05: 8 [IU] via SUBCUTANEOUS
  Administered 2020-10-05: 5 [IU] via SUBCUTANEOUS
  Filled 2020-10-03 (×5): qty 1

## 2020-10-03 MED ORDER — MAGNESIUM SULFATE 2 GM/50ML IV SOLN
INTRAVENOUS | Status: AC
  Administered 2020-10-03: 2 g via INTRAVENOUS
  Filled 2020-10-03: qty 50

## 2020-10-03 MED ORDER — ACETAMINOPHEN 650 MG RE SUPP: 650.0000 mg | Freq: Once | RECTAL | Status: DC

## 2020-10-03 MED ORDER — ACETAMINOPHEN 500 MG PO TABS
1000.0000 mg | ORAL_TABLET | Freq: Once | ORAL | Status: DC
  Administered 2020-10-03: 1000 mg via ORAL
  Filled 2020-10-03: qty 2

## 2020-10-03 NOTE — ED Triage Notes (Signed)
EMS called to pt home for AMS. Pt found to be altered and hyperglycemic with BGL 315 on EMS arrival. Pt alert and oriented x3 and follows commands with prompting. Two runs of VTACH noted with EMS en route. EDP in room upon patient arrival. 18 g LFA started en route and 500 mL of IVF administered pta. Denies alcohol and drug use.

## 2020-10-03 NOTE — ED Notes (Signed)
Unable to fully assess pt SI or HI, allergies, PMH, Home meds, safety screening, etc due to pt altered mental status. MD made aware at time of triage.

## 2020-10-03 NOTE — ED Provider Notes (Signed)
[facility] Emergency Department Provider Note  ____________________________________________   First MD Initiated Contact with Patient 10/03/20 2233     (approximate)  I have reviewed the triage vital signs and the nursing notes.   HISTORY  Chief Complaint Altered Mental Status   HPI Joshua [name] is a 49 y.o. male with a past medical history of HTN, DM, STEMI, and CHF who presents via EMS from home after EMS was called out by family with concerns for confusion/altered mental status and elevated blood sugar.  History is significant limited from the patient on patient arrival secondary to altered mental status..  Patient is able to state current date but is not sure why he is in the emergency room or recent medical history or current medications.  He denies illegal drug use or EtOH use today.  Endorses a cough but does not clearly respond directly to other review of system questions.  Per EMS patient has stable vital signs in route with sugars in the 300s and had several runs of sustained V. tach.  Patient received 500 cc of normal saline in route.  No other history is immediately available patient arrival.         Past Medical History:  Diagnosis Date  . CHF (congestive heart failure) ([place])   . Diabetes mellitus without complication ([place])   . Hypertension     Patient Active Problem List   Diagnosis Date Noted  . Acute respiratory failure with hypoxia ([place]) 07/15/2020  . Multifocal pneumonia 07/15/2020  . Type 2 diabetes mellitus without complication ([place]) 02/77/4128  . History of MI (myocardial infarction) 07/15/2020  . Symptomatic anemia 07/15/2020  . Acute ST elevation myocardial infarction (STEMI) involving left anterior descending (LAD) coronary artery ([place]) 09/09/2018  . STEMI involving left anterior descending coronary artery ([place]) 09/09/2018  . Hypoglycemia 06/02/2016    Past Surgical History:  Procedure Laterality Date  .  CORONARY/GRAFT ACUTE MI REVASCULARIZATION N/A 09/09/2018   Procedure: Coronary/Graft Acute MI Revascularization;  Surgeon: Yolonda Kida, MD;  Location: [place] [facility];  Service: Cardiovascular;  Laterality: N/A;  . LEFT HEART CATH AND CORONARY ANGIOGRAPHY N/A 09/09/2018   Procedure: LEFT HEART CATH AND CORONARY ANGIOGRAPHY;  Surgeon: Yolonda Kida, MD;  Location: [place] [facility];  Service: Cardiovascular;  Laterality: N/A;  . none    . STENT PLACEMENT VASCULAR ([place] HX)      Prior to Admission medications   Medication Sig Start Date End Date Taking? Authorizing Provider  furosemide (LASIX) 20 MG tablet Take 1 tablet (20 mg total) by mouth daily. 09/29/20 09/29/21 Yes Nance Pear, MD  metFORMIN (GLUCOPHAGE) 500 MG tablet Take 1 tablet (500 mg total) by mouth daily with breakfast. 08/02/20 10/03/20 Yes Vladimir Crofts, MD  metoprolol succinate (TOPROL XL) 100 MG 24 hr tablet Take 1 tablet (100 mg total) by mouth daily. Take with or immediately following a meal. 08/02/20 10/03/20 Yes Vladimir Crofts, MD  aspirin EC 81 MG EC tablet Take 1 tablet (81 mg total) by mouth daily. Patient not taking: Reported on 07/16/2020 09/11/18   Demetrios Loll, MD  atorvastatin (LIPITOR) 80 MG tablet Take 1 tablet (80 mg total) by mouth daily at 6 PM. Patient not taking: Reported on 07/16/2020 09/10/18   Demetrios Loll, MD  lisinopril (PRINIVIL,ZESTRIL) 5 MG tablet Take 1 tablet (5 mg total) by mouth daily. Patient not taking: Reported on 07/16/2020 09/10/18   Demetrios Loll, MD  nitroGLYCERIN (NITROSTAT) 0.4 MG SL tablet Place 1  tablet (0.4 mg total) under the tongue every 5 (five) minutes x 3 doses as needed for chest pain. Patient not taking: Reported on 07/16/2020 09/10/18   Demetrios Loll, MD  spironolactone (ALDACTONE) 25 MG tablet Take 25 mg by mouth daily. Patient not taking: Reported on 07/16/2020    [provider]    Allergies Penicillins  History reviewed. No pertinent family history.  Social  History Social History   Tobacco Use  . Smoking status: Current Every Day Smoker  . Smokeless tobacco: Never Used  Vaping Use  . Vaping Use: Never used  Substance Use Topics  . Alcohol use: Yes    Alcohol/week: 0.0 standard drinks  . Drug use: Not Currently    Comment: pt states no found unknown white substance in mouth    Review of Systems  Review of Systems  Unable to perform ROS: Mental status change      ____________________________________________   PHYSICAL EXAM:  VITAL SIGNS: ED Triage Vitals  Enc Vitals Group     BP --      Pulse --      Resp --      Temp --      Temp src --      SpO2 10/03/20 2228 96 %     Weight 10/03/20 2229 180 lb 1.9 oz (81.7 kg)     Height 10/03/20 2229 5\' 10"  (1.778 m)     Head Circumference --      Peak Flow --      Pain Score --      Pain Loc --      Pain Edu? --      Excl. in [place]? --    Vitals:   10/03/20 2228 10/03/20 2235  BP:  134/74  Pulse:  96  Resp:  18  Temp:  (!) 103 F (39.4 C)  SpO2: 96% 93%   Physical Exam Vitals and nursing note reviewed.  Constitutional:      General: He is in acute distress.     Appearance: He is well-developed. He is ill-appearing.  HENT:     Head: Normocephalic and atraumatic.     Right Ear: External ear normal.     Left Ear: External ear normal.     Nose: Nose normal.     Mouth/Throat:     Mouth: Mucous membranes are dry.  Eyes:     Conjunctiva/sclera: Conjunctivae normal.  Cardiovascular:     Rate and Rhythm: Normal rate and regular rhythm.     Heart sounds: No murmur heard.   Pulmonary:     Effort: Pulmonary effort is normal. No respiratory distress.     Breath sounds: Normal breath sounds.  Abdominal:     Palpations: Abdomen is soft.     Tenderness: There is no abdominal tenderness.  Musculoskeletal:     Cervical back: Neck supple.  Skin:    General: Skin is warm and dry.     Capillary Refill: Capillary refill takes 2 to 3 seconds.  Neurological:     Mental  Status: He is alert. He is disoriented and confused.     PERRLA.  EOMI.  Patient moves all extremities spontaneously and symmetrically.  He is able to give this examiner thumbs up with both hands and move both feet on command.  He is able to state the date but is unable to state why he is in emergency room or any recent past medical history or his current medications.   ____________________________________________  LABS (all labs ordered are listed, but only abnormal results are displayed)  Labs Reviewed  GLUCOSE, CAPILLARY - Abnormal; Notable for the following components:      Result Value   Glucose-Capillary 229 (*)    All other components within normal limits  RESPIRATORY PANEL BY RT PCR (FLU A&B, COVID)  URINE CULTURE  CULTURE, BLOOD (ROUTINE X 2)  CULTURE, BLOOD (ROUTINE X 2)  MAGNESIUM  CBC WITH DIFFERENTIAL/PLATELET  COMPREHENSIVE METABOLIC PANEL  BETA-HYDROXYBUTYRIC ACID  BLOOD GAS, VENOUS  LACTIC ACID, PLASMA  LACTIC ACID, PLASMA  BRAIN NATRIURETIC PEPTIDE  AMMONIA  ETHANOL  URINALYSIS, COMPLETE (UACMP) WITH MICROSCOPIC  URINE DRUG SCREEN, QUALITATIVE ([facility] ONLY)  PROTIME-INR  APTT  PROCALCITONIN  PROCALCITONIN  FIBRIN DERIVATIVES D-DIMER ([facility] ONLY)  HEMOGLOBIN A1C  TROPONIN I (HIGH SENSITIVITY)   ____________________________________________  EKG  Sinus rhythm with a ventricular rate of 98, somewhat low amplitude throughout, Q waves in the anterior and inferior leads with unremarkable intervals and normal axis.  No other clear evidence of acute ischemia. ____________________________________________  RADIOLOGY  ED MD interpretation: Stable cardiomegaly without significant effusion, focal consolidative process, pneumothorax, or overt pulmonary edema.  Official radiology report(s): DG Chest Port 1 View  Result Date: 10/03/2020 CLINICAL DATA:  49 year old male with altered mental status, hyperglycemia. EXAM: PORTABLE CHEST 1 VIEW COMPARISON:  Chest  radiographs 09/28/2020 and earlier. FINDINGS: Portable AP upright view at 2247 hours. Pacer or resuscitation pads project over the central chest. Stable cardiomegaly and mediastinal contours. Stable lung volumes. Resolved bilateral Patchy and confluent pulmonary opacity seen on 07/15/2020. Residual increased somewhat coarse interstitial markings in both lungs appear chronic, although probably not significantly changed since 2017. No superimposed pneumothorax, pulmonary edema, pleural effusion or confluent pulmonary opacity. No acute osseous abnormality identified. Negative visible bowel gas pattern. IMPRESSION: Stable.  No acute cardiopulmonary abnormality. Electronically Signed   By: Genevie Ann M.D.   On: 10/03/2020 22:59    ____________________________________________   PROCEDURES  Procedure(s) performed (including Critical Care):  Procedures   ____________________________________________   INITIAL IMPRESSION / ASSESSMENT AND PLAN / ED COURSE       Patient presents above to history exam for assessment of altered mental status and hyperglycemia.  Patient is febrile with a temperature of 103 and borderline hypoxic with SPO2 saturation of 93% on room air on arrival with otherwise stable vital signs.  Exam as above remarkable patient that is confused with a nonfocal neuro exam and no clear foci of infectious process on exam.  Differential includes but is not limited to DKA, toxic ingestion, severe metabolic derangements, sepsis, meningitis, and Covid.  We will plan to obtain CT head, CBC, CMP, ammonia, TSH, troponin, UA, UDS, ethanol, VBG, beta hydroxybutyrate, BMP, and dimer to assess for etiology for patient's presentations.  Care of patient signed over to oncoming provider approximately 2300.  Please see ongoing providers note for details regarding results of above-noted work-up I initiated and final disposition.  Given his fever and altered mental status I did give the patient 1 dose of  Rocephin and gave him Tylenol.  ____________________________________________   FINAL CLINICAL IMPRESSION(S) / ED DIAGNOSES  Final diagnoses:  Altered mental status, unspecified altered mental status type  Fever, unspecified fever cause  Hyperglycemia    Medications  magnesium sulfate 2 GM/50ML IVPB (has no administration in time range)  acetaminophen (TYLENOL) tablet 1,000 mg (has no administration in time range)  insulin aspart (novoLOG) injection 0-15 Units (has no administration in time range)  cefTRIAXone (ROCEPHIN) 1  g in sodium chloride 0.9 % 100 mL IVPB (has no administration in time range)     ED Discharge Orders    None       Note:  This document was prepared using Dragon voice recognition software and may include unintentional dictation errors.   Lucrezia Starch, MD 10/03/20 [phone]

## 2020-10-03 NOTE — Congregational Nurse Program (Unsigned)
Client was recently discharged from the hospital at [facility] on 09/28/20. Was seen for shortness of breath. Patient is also a diabetic, has HTN, CHF. Only has furosemide meds and 2 tabs of metformin with him. Bp is 118/72. Glucose is 259. Has a non productive cough and runny nose. States tested negative for COVID in the hospital. Denies any other meds. States drinks ETOH and smokes cigars. Assist patient in making a follow up appointment for [facility] on October 28 th at [facility]. Education reviewed on diabetes, nutrition and furosemide. Also gave client phone number for medicaid transportation to set up a ride for the appointment.

## 2020-10-03 NOTE — ED Provider Notes (Addendum)
I assumed care of the patient from Dr. Tamala Julian at 11:00 PM.  On my arrival to the room to evaluate the patient patient minimally responsive to verbal stimuli however protecting airway.  I reviewed the patient's rhythm strip from EMS with characteristic findings of [place] as such 2 g of IV magnesium sulfate being administered.  I returned to the room to reevaluate the patient and at this point the patient is awake and alert and oriented x4.  Patient does admit to cocaine use 2 days ago.  Concern for possible sepsis etiology versus cocaine toxicity.  UDS positive for cocaine.  Patient given appropriate IV antibiotic therapy as he received ceftriaxone from Dr. Tamala Julian and IV vancomycin.  Patient discussed with Dr. Gayla Medicus for hospital admission for further evaluation and management.   Gregor Hams, MD 10/04/20 [id number]    Gregor Hams, MD 10/04/20 [phone]

## 2020-10-04 ENCOUNTER — Inpatient Hospital Stay: Payer: Medicaid Other

## 2020-10-04 ENCOUNTER — Encounter: Payer: Self-pay | Admitting: Family Medicine

## 2020-10-04 DIAGNOSIS — J189 Pneumonia, unspecified organism: Secondary | ICD-10-CM | POA: Diagnosis present

## 2020-10-04 DIAGNOSIS — E1165 Type 2 diabetes mellitus with hyperglycemia: Secondary | ICD-10-CM | POA: Diagnosis present

## 2020-10-04 DIAGNOSIS — I110 Hypertensive heart disease with heart failure: Secondary | ICD-10-CM | POA: Diagnosis present

## 2020-10-04 DIAGNOSIS — G9341 Metabolic encephalopathy: Secondary | ICD-10-CM | POA: Diagnosis present

## 2020-10-04 DIAGNOSIS — J9602 Acute respiratory failure with hypercapnia: Secondary | ICD-10-CM | POA: Diagnosis present

## 2020-10-04 DIAGNOSIS — I5043 Acute on chronic combined systolic (congestive) and diastolic (congestive) heart failure: Secondary | ICD-10-CM | POA: Diagnosis present

## 2020-10-04 DIAGNOSIS — Z880 Allergy status to penicillin: Secondary | ICD-10-CM | POA: Diagnosis not present

## 2020-10-04 DIAGNOSIS — I4901 Ventricular fibrillation: Secondary | ICD-10-CM | POA: Diagnosis not present

## 2020-10-04 DIAGNOSIS — Z955 Presence of coronary angioplasty implant and graft: Secondary | ICD-10-CM | POA: Diagnosis not present

## 2020-10-04 DIAGNOSIS — J9601 Acute respiratory failure with hypoxia: Secondary | ICD-10-CM | POA: Diagnosis present

## 2020-10-04 DIAGNOSIS — Z20822 Contact with and (suspected) exposure to covid-19: Secondary | ICD-10-CM | POA: Diagnosis present

## 2020-10-04 DIAGNOSIS — R6510 Systemic inflammatory response syndrome (SIRS) of non-infectious origin without acute organ dysfunction: Secondary | ICD-10-CM | POA: Diagnosis present

## 2020-10-04 DIAGNOSIS — I252 Old myocardial infarction: Secondary | ICD-10-CM | POA: Diagnosis not present

## 2020-10-04 DIAGNOSIS — R4182 Altered mental status, unspecified: Secondary | ICD-10-CM

## 2020-10-04 DIAGNOSIS — Z79899 Other long term (current) drug therapy: Secondary | ICD-10-CM | POA: Diagnosis not present

## 2020-10-04 DIAGNOSIS — F17200 Nicotine dependence, unspecified, uncomplicated: Secondary | ICD-10-CM | POA: Diagnosis present

## 2020-10-04 DIAGNOSIS — F1410 Cocaine abuse, uncomplicated: Secondary | ICD-10-CM | POA: Diagnosis present

## 2020-10-04 DIAGNOSIS — I2510 Atherosclerotic heart disease of native coronary artery without angina pectoris: Secondary | ICD-10-CM | POA: Diagnosis present

## 2020-10-04 DIAGNOSIS — D649 Anemia, unspecified: Secondary | ICD-10-CM | POA: Diagnosis present

## 2020-10-04 DIAGNOSIS — I472 Ventricular tachycardia: Secondary | ICD-10-CM | POA: Diagnosis not present

## 2020-10-04 DIAGNOSIS — R9431 Abnormal electrocardiogram [ECG] [EKG]: Secondary | ICD-10-CM | POA: Diagnosis present

## 2020-10-04 DIAGNOSIS — E1140 Type 2 diabetes mellitus with diabetic neuropathy, unspecified: Secondary | ICD-10-CM | POA: Diagnosis present

## 2020-10-04 DIAGNOSIS — Z5900 Homelessness unspecified: Secondary | ICD-10-CM | POA: Diagnosis not present

## 2020-10-04 DIAGNOSIS — Z7982 Long term (current) use of aspirin: Secondary | ICD-10-CM | POA: Diagnosis not present

## 2020-10-04 DIAGNOSIS — Z9119 Patient's noncompliance with other medical treatment and regimen: Secondary | ICD-10-CM | POA: Diagnosis not present

## 2020-10-04 DIAGNOSIS — R509 Fever, unspecified: Secondary | ICD-10-CM | POA: Diagnosis present

## 2020-10-04 DIAGNOSIS — Z7984 Long term (current) use of oral hypoglycemic drugs: Secondary | ICD-10-CM | POA: Diagnosis not present

## 2020-10-04 LAB — URINE DRUG SCREEN, QUALITATIVE (ARMC ONLY)
Amphetamines, Ur Screen: NOT DETECTED
Barbiturates, Ur Screen: NOT DETECTED
Benzodiazepine, Ur Scrn: NOT DETECTED
Cannabinoid 50 Ng, Ur ~~LOC~~: POSITIVE — AB
Cocaine Metabolite,Ur ~~LOC~~: POSITIVE — AB
MDMA (Ecstasy)Ur Screen: NOT DETECTED
Methadone Scn, Ur: NOT DETECTED
Opiate, Ur Screen: NOT DETECTED
Phencyclidine (PCP) Ur S: NOT DETECTED
Tricyclic, Ur Screen: NOT DETECTED

## 2020-10-04 LAB — BLOOD GAS, ARTERIAL
Acid-Base Excess: 1.7 mmol/L (ref 0.0–2.0)
Bicarbonate: 25.7 mmol/L (ref 20.0–28.0)
FIO2: 21
O2 Saturation: 90.2 %
Patient temperature: 37
pCO2 arterial: 37 mmHg (ref 32.0–48.0)
pH, Arterial: 7.45 (ref 7.350–7.450)
pO2, Arterial: 56 mmHg — ABNORMAL LOW (ref 83.0–108.0)

## 2020-10-04 LAB — BLOOD GAS, VENOUS
Acid-Base Excess: 6.3 mmol/L — ABNORMAL HIGH (ref 0.0–2.0)
Bicarbonate: 32.2 mmol/L — ABNORMAL HIGH (ref 20.0–28.0)
O2 Saturation: 32 %
Patient temperature: 37
pCO2, Ven: 52 mmHg (ref 44.0–60.0)
pH, Ven: 7.4 (ref 7.250–7.430)
pO2, Ven: <31 mmHg — CL (ref 32.0–45.0)

## 2020-10-04 LAB — URINALYSIS, COMPLETE (UACMP) WITH MICROSCOPIC
Bilirubin Urine: NEGATIVE
Glucose, UA: NEGATIVE mg/dL
Hgb urine dipstick: NEGATIVE
Ketones, ur: NEGATIVE mg/dL
Leukocytes,Ua: NEGATIVE
Nitrite: NEGATIVE
Protein, ur: NEGATIVE mg/dL
Specific Gravity, Urine: 1.019 (ref 1.005–1.030)
WBC, UA: NONE SEEN WBC/hpf (ref 0–5)
pH: 8 (ref 5.0–8.0)

## 2020-10-04 LAB — BASIC METABOLIC PANEL
Anion gap: 7 (ref 5–15)
BUN: 11 mg/dL (ref 6–20)
CO2: 24 mmol/L (ref 22–32)
Calcium: 7.6 mg/dL — ABNORMAL LOW (ref 8.9–10.3)
Chloride: 105 mmol/L (ref 98–111)
Creatinine, Ser: 1.06 mg/dL (ref 0.61–1.24)
GFR, Estimated: >60 mL/min (ref >60)
Glucose, Bld: 308 mg/dL — ABNORMAL HIGH (ref 70–99)
Potassium: 4 mmol/L (ref 3.5–5.1)
Sodium: 136 mmol/L (ref 135–145)

## 2020-10-04 LAB — LACTIC ACID, PLASMA: Lactic Acid, Venous: 0.8 mmol/L (ref 0.5–1.9)

## 2020-10-04 LAB — PROCALCITONIN
Procalcitonin: <0.1 ng/mL
Procalcitonin: <0.1 ng/mL
Procalcitonin: <0.1 ng/mL

## 2020-10-04 LAB — RESPIRATORY PANEL BY RT PCR (FLU A&B, COVID)
Influenza A by PCR: NEGATIVE
Influenza B by PCR: NEGATIVE
SARS Coronavirus 2 by RT PCR: NEGATIVE

## 2020-10-04 LAB — CBC
HCT: 28.7 % — ABNORMAL LOW (ref 39.0–52.0)
Hemoglobin: 8.3 g/dL — ABNORMAL LOW (ref 13.0–17.0)
MCH: 21.6 pg — ABNORMAL LOW (ref 26.0–34.0)
MCHC: 28.9 g/dL — ABNORMAL LOW (ref 30.0–36.0)
MCV: 74.5 fL — ABNORMAL LOW (ref 80.0–100.0)
Platelets: 242 10*3/uL (ref 150–400)
RBC: 3.85 MIL/uL — ABNORMAL LOW (ref 4.22–5.81)
RDW: 18 % — ABNORMAL HIGH (ref 11.5–15.5)
WBC: 8.4 10*3/uL (ref 4.0–10.5)
nRBC: 0 % (ref 0.0–0.2)

## 2020-10-04 LAB — BRAIN NATRIURETIC PEPTIDE: B Natriuretic Peptide: 815.9 pg/mL — ABNORMAL HIGH (ref 0.0–100.0)

## 2020-10-04 LAB — TROPONIN I (HIGH SENSITIVITY): Troponin I (High Sensitivity): 11 ng/L (ref <18)

## 2020-10-04 LAB — PROTIME-INR
INR: 1.1 (ref 0.8–1.2)
Prothrombin Time: 13.7 seconds (ref 11.4–15.2)

## 2020-10-04 LAB — GLUCOSE, CAPILLARY
Glucose-Capillary: 286 mg/dL — ABNORMAL HIGH (ref 70–99)
Glucose-Capillary: 137 mg/dL — ABNORMAL HIGH (ref 70–99)
Glucose-Capillary: 144 mg/dL — ABNORMAL HIGH (ref 70–99)
Glucose-Capillary: 208 mg/dL — ABNORMAL HIGH (ref 70–99)
Glucose-Capillary: 237 mg/dL — ABNORMAL HIGH (ref 70–99)

## 2020-10-04 LAB — FIBRIN DERIVATIVES D-DIMER (ARMC ONLY): Fibrin derivatives D-dimer (ARMC): 409.66 ng/mL (FEU) (ref 0.00–499.00)

## 2020-10-04 LAB — CK: Total CK: 112 U/L (ref 49–397)

## 2020-10-04 IMAGING — CT CT ABD-PELV W/ CM
2 of 5 series · 16 of 46 positions shown, 18 images · IV contrast (APPLIED)
Comparison: Abdominal radiographs [DATE]

CLINICAL DATA: Abdominal distension and shortness of breath. Sepsis
and cocaine use.

EXAM:
CT ABDOMEN AND PELVIS WITH CONTRAST
TECHNIQUE: Multidetector CT imaging of the abdomen and pelvis was performed
using the standard protocol following bolus administration of
intravenous contrast.
CONTRAST:  100mL OMNIPAQUE IOHEXOL 300 MG/ML  SOLN

[Series 2: routine abd/pel with · axial · 0.75mm/px · z∈[-1061,-621]mm · 13 of 100 slices shown, 15 images]
[im 6/100  soft-tissue]
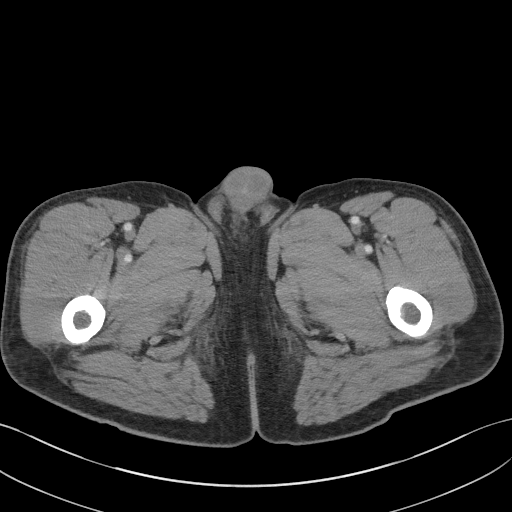
[im 6/100  bone]
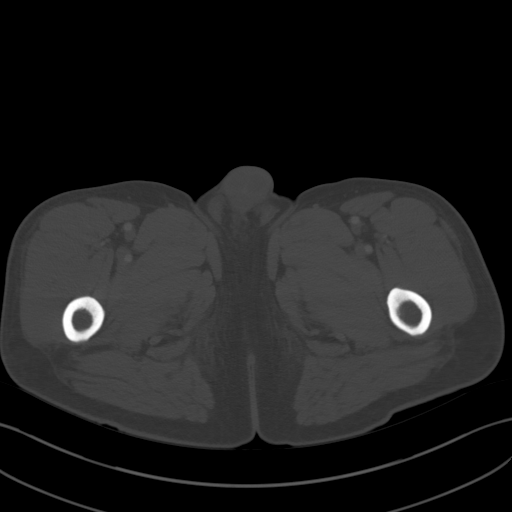
[im 12/100  soft-tissue]
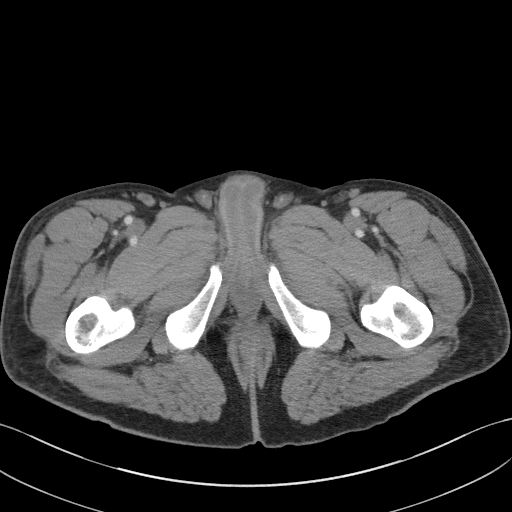
[im 23/100  soft-tissue]
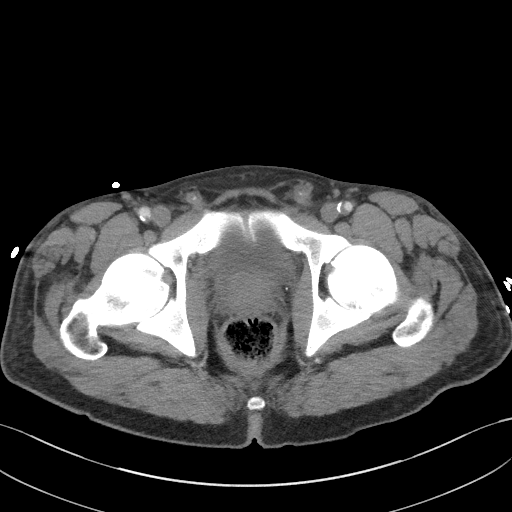
[im 28/100  soft-tissue]
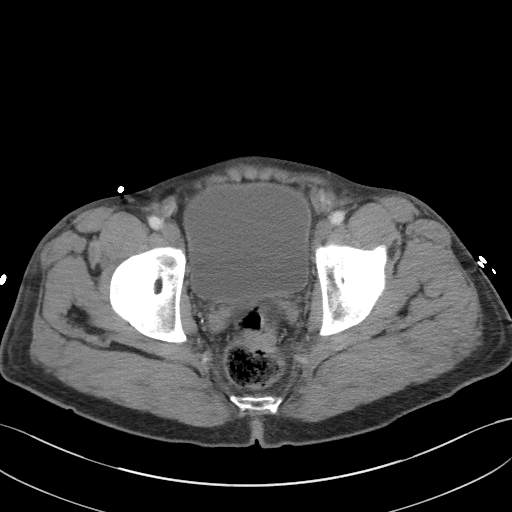
[im 34/100  soft-tissue]
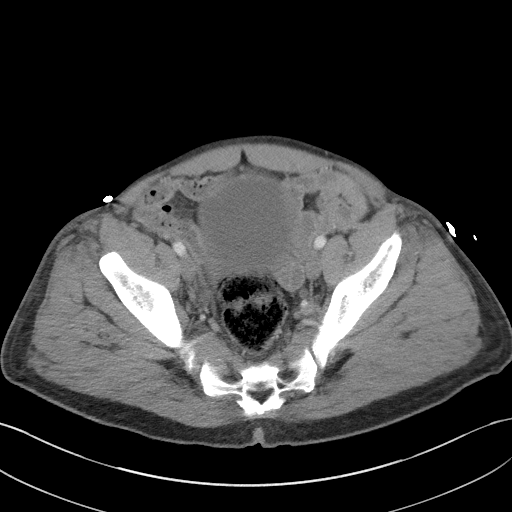
[im 45/100  soft-tissue]
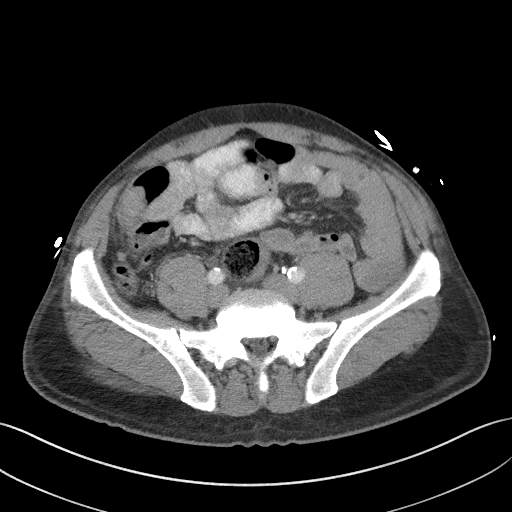
[im 50/100  soft-tissue]
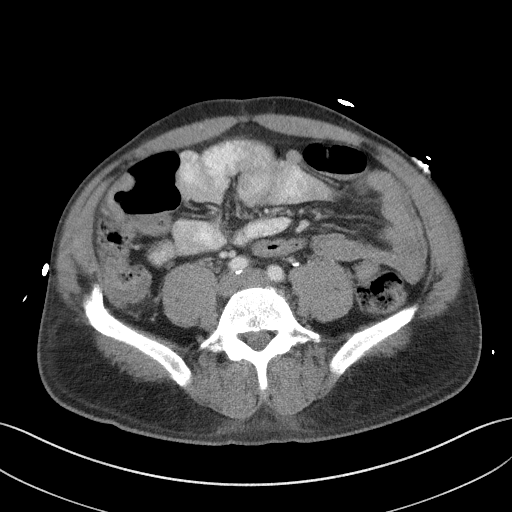
[im 56/100  soft-tissue]
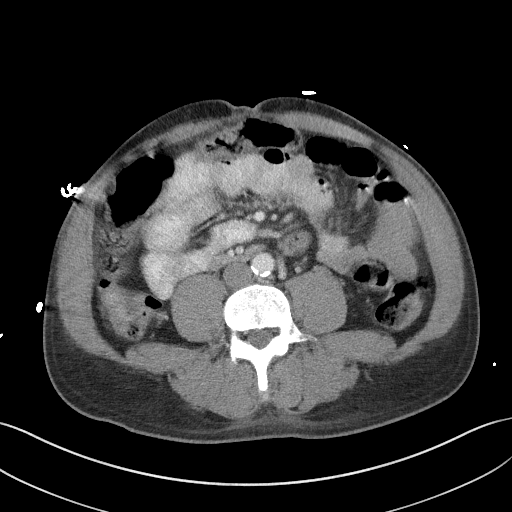
[im 67/100  soft-tissue]
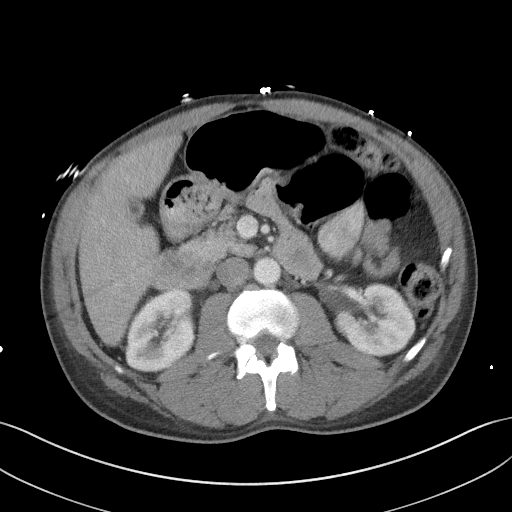
[im 67/100  bone]
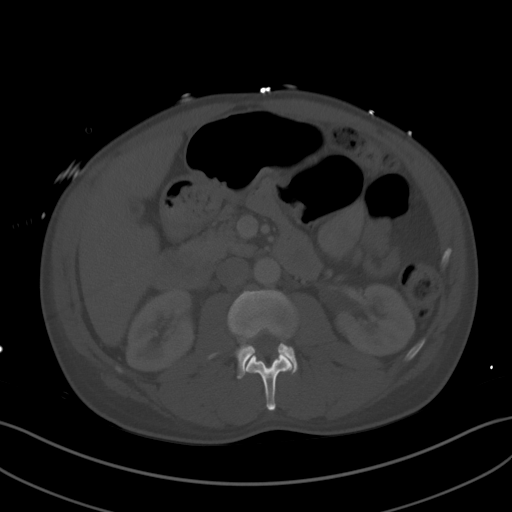
[im 72/100  soft-tissue]
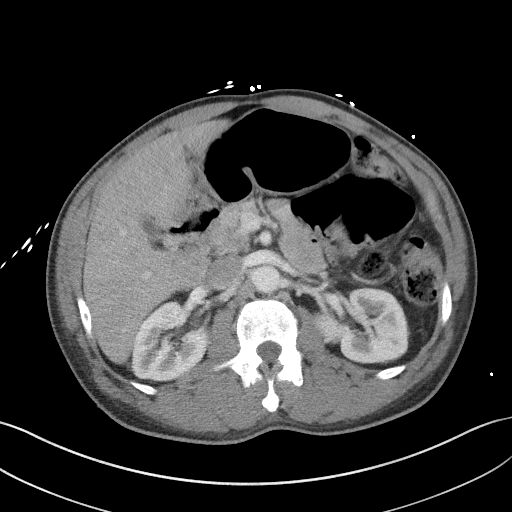
[im 78/100  soft-tissue]
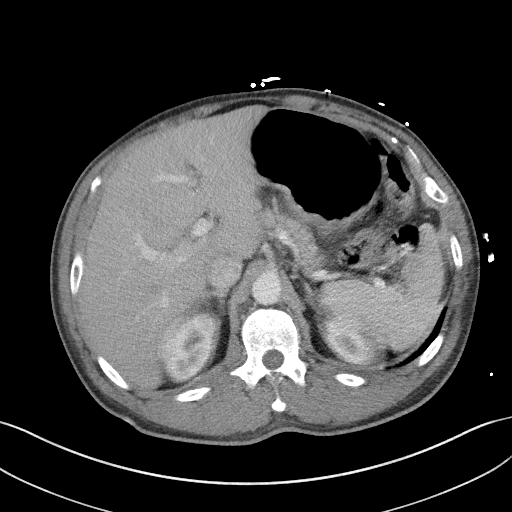
[im 89/100  soft-tissue]
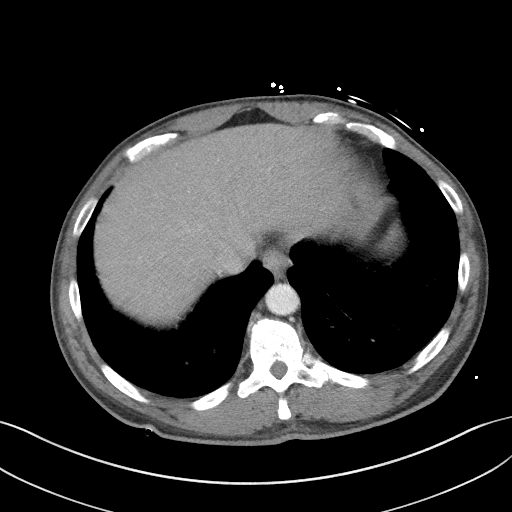
[im 94/100  soft-tissue]
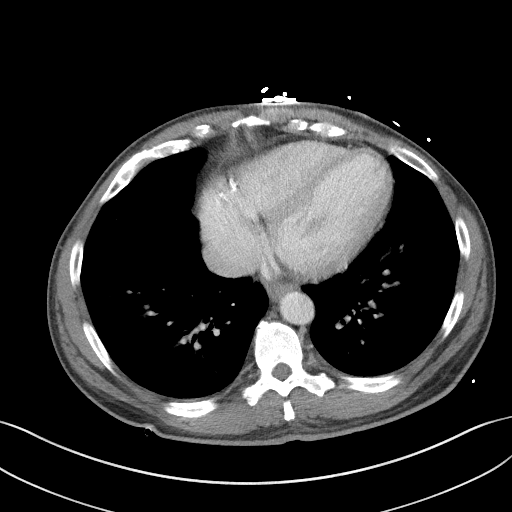

[Series 5: coronal st · coronal · 0.77mm/px · 3 of 85 slices shown]
[im 29/85  soft-tissue]
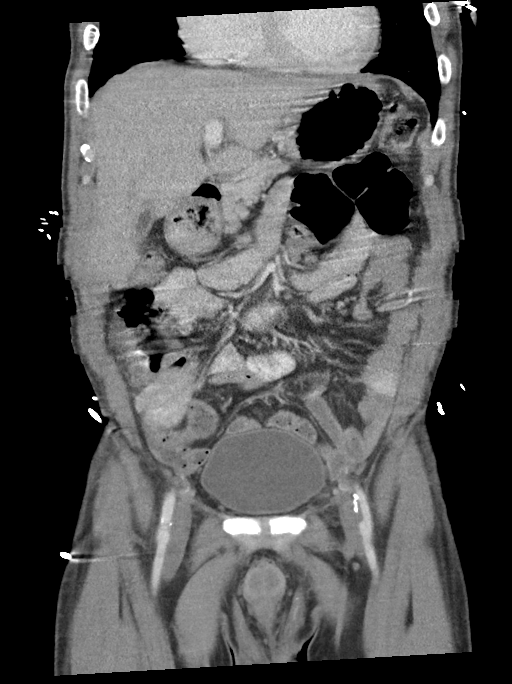
[im 38/85  soft-tissue]
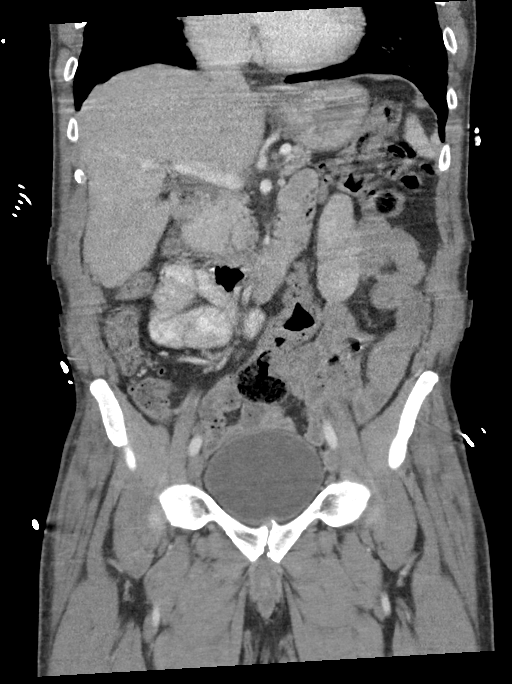
[im 47/85  soft-tissue]
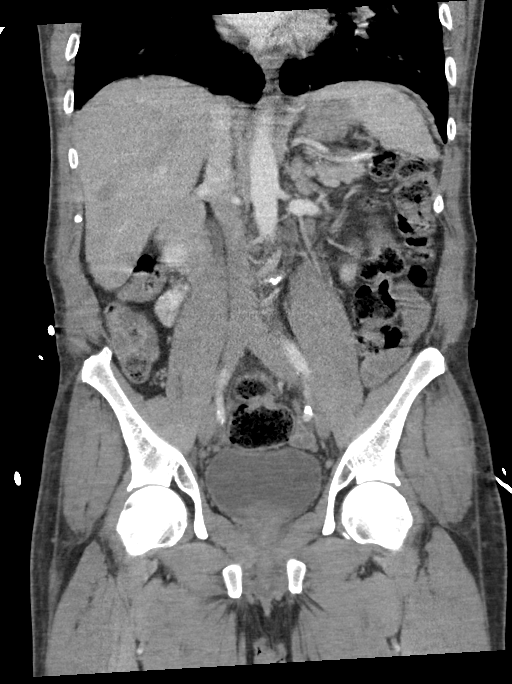

[16 of 46 positions shown; findings below may reference images not displayed]

FINDINGS: Lower chest: Motion artifact limits examination but there appear to
be patchy infiltrates in the lung bases. Possibly pneumonia or
edema. Cardiac enlargement.

Hepatobiliary: Mild diffuse fatty infiltration of the liver. The
gallbladder is decompressed. No bile duct dilatation.

Pancreas: Unremarkable. No pancreatic ductal dilatation or
surrounding inflammatory changes.

Spleen: Normal in size without focal abnormality.

Adrenals/Urinary Tract: Adrenal glands are unremarkable. Kidneys are
normal, without renal calculi, focal lesion, or hydronephrosis.
Bladder is unremarkable.

Stomach/Bowel: Stomach is within normal limits. Appendix appears
normal. No evidence of bowel wall thickening, distention, or
inflammatory changes.

Vascular/Lymphatic: Aortic atherosclerosis. No enlarged abdominal or
pelvic lymph nodes.

Reproductive: Prostate is unremarkable.

Other: No abdominal wall hernia or abnormality. No abdominopelvic
ascites.

Musculoskeletal: No acute or significant osseous findings.
IMPRESSION: 1. Patchy infiltrates in the lung bases may indicate pneumonia or
edema.
2. Mild diffuse fatty infiltration of the liver.
3. No evidence of bowel obstruction or inflammation.
4. Aortic atherosclerosis.

Aortic Atherosclerosis ([CZ]-[CZ]).

## 2020-10-04 MED ORDER — ACETAMINOPHEN 650 MG RE SUPP: 650.0000 mg | Freq: Four times a day (QID) | RECTAL | Status: DC | PRN

## 2020-10-04 MED ORDER — CARVEDILOL 6.25 MG PO TABS
3.1250 mg | ORAL_TABLET | Freq: Two times a day (BID) | ORAL | Status: DC
  Administered 2020-10-05: 3.125 mg via ORAL
  Filled 2020-10-04: qty 1

## 2020-10-04 MED ORDER — VANCOMYCIN HCL IN DEXTROSE 1-5 GM/200ML-% IV SOLN
1000.0000 mg | Freq: Once | INTRAVENOUS | Status: AC
  Administered 2020-10-04: 1000 mg via INTRAVENOUS
  Filled 2020-10-04: qty 200

## 2020-10-04 MED ORDER — VANCOMYCIN HCL IN DEXTROSE 1-5 GM/200ML-% IV SOLN: 1000.0000 mg | Freq: Once | INTRAVENOUS | Status: DC

## 2020-10-04 MED ORDER — ACETAMINOPHEN 325 MG PO TABS
650.0000 mg | ORAL_TABLET | Freq: Four times a day (QID) | ORAL | Status: DC | PRN
  Filled 2020-10-04: qty 2

## 2020-10-04 MED ORDER — TRAZODONE HCL 50 MG PO TABS: 25.0000 mg | ORAL_TABLET | Freq: Every evening | ORAL | Status: DC | PRN

## 2020-10-04 MED ORDER — ASPIRIN EC 81 MG PO TBEC
81.0000 mg | DELAYED_RELEASE_TABLET | Freq: Every day | ORAL | Status: DC
  Administered 2020-10-04 – 2020-10-05 (×2): 81 mg via ORAL
  Filled 2020-10-04 (×2): qty 1

## 2020-10-04 MED ORDER — ONDANSETRON HCL 4 MG/2ML IJ SOLN: 4.0000 mg | Freq: Four times a day (QID) | INTRAMUSCULAR | Status: DC | PRN

## 2020-10-04 MED ORDER — ENOXAPARIN SODIUM 40 MG/0.4ML ~~LOC~~ SOLN
40.0000 mg | SUBCUTANEOUS | Status: DC
  Administered 2020-10-05: 40 mg via SUBCUTANEOUS
  Filled 2020-10-04: qty 0.4

## 2020-10-04 MED ORDER — IOHEXOL 300 MG/ML  SOLN
100.0000 mL | Freq: Once | INTRAMUSCULAR | Status: AC | PRN
  Administered 2020-10-04: 100 mL via INTRAVENOUS

## 2020-10-04 MED ORDER — ATORVASTATIN CALCIUM 20 MG PO TABS
80.0000 mg | ORAL_TABLET | Freq: Every day | ORAL | Status: DC
  Administered 2020-10-04: 80 mg via ORAL
  Filled 2020-10-04: qty 4

## 2020-10-04 MED ORDER — MORPHINE SULFATE (PF) 2 MG/ML IV SOLN
2.0000 mg | INTRAVENOUS | Status: DC | PRN
  Filled 2020-10-04: qty 1

## 2020-10-04 MED ORDER — FUROSEMIDE 10 MG/ML IJ SOLN
40.0000 mg | Freq: Once | INTRAMUSCULAR | Status: AC
  Administered 2020-10-04: 40 mg via INTRAVENOUS
  Filled 2020-10-04: qty 4

## 2020-10-04 MED ORDER — SODIUM CHLORIDE 0.9 % IV SOLN: INTRAVENOUS | Status: DC

## 2020-10-04 MED ORDER — INSULIN ASPART 100 UNIT/ML ~~LOC~~ SOLN: 0.0000 [IU] | Freq: Three times a day (TID) | SUBCUTANEOUS | Status: DC

## 2020-10-04 MED ORDER — METRONIDAZOLE IN NACL 5-0.79 MG/ML-% IV SOLN
500.0000 mg | Freq: Three times a day (TID) | INTRAVENOUS | Status: DC
  Filled 2020-10-04: qty 100

## 2020-10-04 MED ORDER — ONDANSETRON HCL 4 MG PO TABS: 4.0000 mg | ORAL_TABLET | Freq: Four times a day (QID) | ORAL | Status: DC | PRN

## 2020-10-04 MED ORDER — FUROSEMIDE 40 MG PO TABS
20.0000 mg | ORAL_TABLET | Freq: Every day | ORAL | Status: DC
  Filled 2020-10-04: qty 1

## 2020-10-04 MED ORDER — SODIUM CHLORIDE 0.9 % IV SOLN
2.0000 g | Freq: Three times a day (TID) | INTRAVENOUS | Status: DC
  Administered 2020-10-04 – 2020-10-05 (×4): 2 g via INTRAVENOUS
  Filled 2020-10-04 (×6): qty 2

## 2020-10-04 MED ORDER — SODIUM CHLORIDE 0.9 % IV SOLN: 2.0000 g | Freq: Once | INTRAVENOUS | Status: DC

## 2020-10-04 MED ORDER — METOPROLOL SUCCINATE ER 50 MG PO TB24
100.0000 mg | ORAL_TABLET | Freq: Every day | ORAL | Status: DC
  Filled 2020-10-04: qty 2

## 2020-10-04 MED ORDER — MAGNESIUM HYDROXIDE 400 MG/5ML PO SUSP: 30.0000 mL | Freq: Every day | ORAL | Status: DC | PRN

## 2020-10-04 MED ORDER — VANCOMYCIN HCL IN DEXTROSE 1-5 GM/200ML-% IV SOLN: 1000.0000 mg | Freq: Two times a day (BID) | INTRAVENOUS | Status: DC

## 2020-10-04 NOTE — ED Notes (Signed)
Pt resting in bed, watching tv at this time. Pt requested sandwich and was given sandwich per request.  Pt has no other needs identified at this time.

## 2020-10-04 NOTE — Progress Notes (Signed)
Pharmacy Antibiotic Note  Joshua [name] is a 49 y.o. male admitted on 10/03/2020 with sepsis.  Pharmacy has been consulted for Vanc, Cefepime  Dosing. Vancomycin d/c'd 10/22.   Plan: Continue cefepime 2 g IV Q8H    Height: 5\' 10"  (177.8 cm) Weight: 81.7 kg (180 lb 1.9 oz) IBW/kg (Calculated) : 73  Temp (24hrs), Avg:100 F (37.8 C), Min:97.2 F (36.2 C), Max:103 F (39.4 C)  Recent Labs  Lab 09/28/20 2005 10/03/20 2237 10/04/20 0203 10/04/20 0517  WBC 8.0 7.5  --  8.4  CREATININE 1.15 1.17  --  1.06  LATICACIDVEN  --  1.4 0.8  --     Estimated Creatinine Clearance: 88 mL/min (by C-G formula based on SCr of 1.06 mg/dL).    Allergies  Allergen Reactions  . Penicillins Other (See Comments)    Patient unsure of allergy    Antimicrobials this admission: Ceftriaxone x1 10/21 Cefepime 10/22 >> Vanc 10/22 >> 10/22 Flagyl - no doses given, now discontinued  Dose adjustments this admission:   Microbiology results:  BCx: NGTD x2  UCx:  collected   Thank you for allowing pharmacy to be a part of this patient's care.  Rocky Morel 10/04/2020 11:09 AM

## 2020-10-04 NOTE — ED Notes (Signed)
Called Respirztory Joshua [name] for ABG

## 2020-10-04 NOTE — ED Notes (Signed)
This RN and Metallurgist attempted I&O cath with no success

## 2020-10-04 NOTE — ED Notes (Signed)
messaged MD to let him know of pt's abd pain and distention

## 2020-10-04 NOTE — Progress Notes (Signed)
Pharmacy Antibiotic Note  Joshua [name] is a 49 y.o. male admitted on 10/03/2020 with sepsis.  Pharmacy has been consulted for Vanc, Cefepime  dosing.  Plan: Cefepime 2 gm IV Q8H ordered to start on 10/22 @ 0400.  Vancomycin 1 gm IV X 1 given in ED on 10/22 @ 0202. Additional Vancomycin 1 gm IV given on 10/22 @ 0401 to make total loading dose of 2 gm. Vancomycin 1 gm IV Q12H ordered to continue on 10/22 @ 1400.  Height: 5\' 10"  (177.8 cm) Weight: 81.7 kg (180 lb 1.9 oz) IBW/kg (Calculated) : 73  Temp (24hrs), Avg:100.1 F (37.8 C), Min:97.2 F (36.2 C), Max:103 F (39.4 C)  Recent Labs  Lab 09/28/20 2005 10/03/20 2237 10/04/20 0203  WBC 8.0 7.5  --   CREATININE 1.15 1.17  --   LATICACIDVEN  --  1.4 0.8    Estimated Creatinine Clearance: 79.7 mL/min (by C-G formula based on SCr of 1.17 mg/dL).    Allergies  Allergen Reactions   Penicillins Other (See Comments)    Patient unsure of allergy    Antimicrobials this admission:   >>    >>   Dose adjustments this admission:   Microbiology results:  BCx:   UCx:    Sputum:    MRSA PCR:   Thank you for allowing pharmacy to be a part of this patients care.  Momen Ham D 10/04/2020 4:52 AM

## 2020-10-04 NOTE — Progress Notes (Signed)
CODE SEPSIS - PHARMACY COMMUNICATION  **Broad Spectrum Antibiotics should be administered within 1 hour of Sepsis diagnosis**  Time Code Sepsis Called/Page Received:  10/22 @ 0320  Antibiotics Ordered: Vancomycin, Ceftriaxone   Time of 1st antibiotic administration: Ceftriaxone 1 gm IV X 1 @ 10/21 2331   Additional action taken by pharmacy:   If necessary, Name of Provider/Nurse Contacted:     Marnita Poirier D ,PharmD Clinical Pharmacist  10/04/2020  3:30 AM

## 2020-10-04 NOTE — ED Notes (Signed)
Messaged pharmacy requesting medication verification

## 2020-10-04 NOTE — ED Notes (Signed)
Mansy MD to bedside assessing pt

## 2020-10-04 NOTE — ED Notes (Signed)
MD informed that pt having labored breathing

## 2020-10-04 NOTE — Progress Notes (Addendum)
[facility] PLAN OF CARE NOTE Patient: Joshua [name] INO:[id number]   PCP: Patient, No Pcp Per DOB: 1971/10/21   DOA: 10/03/2020   DOS: 10/04/2020    Patient was admitted by my colleague earlier on 10/04/2020. I have reviewed the H&P as well as assessment and plan and agree with the same. Important changes in the plan are listed below.  Plan of care: Active Problems:   SIRS (systemic inflammatory response syndrome) (HCC) Acute hypoxic respiratory failure  acute metabolic encephalopathy. During my evaluation in the morning the patient was drowsy.  Mid conversation will go to sleep.  Required multiple times effort to wake him up from sleep during conversation. As the day progressed mentation improved. Patient was able to swallow safely per RN. Diet advanced to regular diet. Continue cefepime.  Discontinue vancomycin. Continue to monitor in the hospital. Change from [facility] to telemetry. Discontinue BiPAP. ABG reassuring. Discontinue Lopressor given cocaine abuse.  Concern for torsades-ruled out. Last night patient had an unresponsive event at which time telemetry was showing torsades. I reviewed patient's telemetry at 23:21 which listed as V. tach, 22:58 listed as V. fib, 23:50 listed as V. fib and 23;50 listed as V. Tach. All the events are artifact actually with QRS complex marching through the V. fib/V. tach pattern clearly seen.  I do not feel that the patient actually has any torsades or V. tach or V. Fib.  Chronic combined diastolic and systolic CHF EF 25 to 09% in August. Currently does appear euvolemic. We will hold off on cardiology consultation. Switch Toprol-XL to Coreg given his cocaine abuse.   Author: Berle Mull, MD [facility] 10/04/2020 4:18 PM   If 7PM-7AM, please contact night-coverage at [email or web address]

## 2020-10-04 NOTE — H&P (Signed)
Joshua [name]   PATIENT NAMEKessler [name]    MR#:  [id number]  DATE OF BIRTH:  1971/09/13  DATE OF ADMISSION:  10/03/2020  PRIMARY CARE PHYSICIAN: Patient, No Pcp Per   REQUESTING/REFERRING PHYSICIAN: Marjean Donna, MD CHIEF COMPLAINT:   Chief Complaint  Patient presents with  . Altered Mental Status    HISTORY OF PRESENT ILLNESS:  Joshua [name]  is a 49 y.o. 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  Is been having fever and chills.  He denied any nausea or vomiting or abdominal pain.  No dysuria, oliguria or hematuria or flank pain.  He was slightly somnolent but arousable during my interview.  When he came to the ER, temperature was 103 orally and respiratory rate was initially 20 and later on 37-39 with otherwise normal vital signs..  Labs revealed ABG with pH 7.4 with PCO2 52 and PO2 of 31 HCO3 32.2 and O2 sat of 32% on room air initially.  On O2 by nasal cannula he maintained O2 sat of 94 to 98%.  CBC showed anemia close to baseline and CMP revealed hyperglycemia.  Influenza antigens came back negative as well as COVID-19 PCR.  BNP came back 815.9 and high-sensitivity troponin I was 10.  Urinalysis came back negative and urine drug screen was positive for cocaine and cannabinoids.  Alcohol levels was less than 10. EKG showed sinus rhythm with rate of 98 with probable left atrial enlargement and anteroseptal Q waves.  Chest x-ray showed no acute cardiopulmonary disease it revealed resolved bilateral patchy and confluent pulmonary opacity seen on 07/15/2020.  The patient was given a gram of IV ceftriaxone and a gram of IV vancomycin, 500 mill IV  normal saline bolus and 2 g of IV magnesium sulfate.  He became more short of breath with tachypnea and use of accessory muscles.  Repeat ABG was ordered.  He will be placed on BiPAP.  He will be admitted to progressive cardiac unit bed for further evaluation and management PAST MEDICAL HISTORY:   Past Medical History:  Diagnosis Date  . CHF (congestive heart failure) ([place])   . Diabetes mellitus without complication ([place])   . Hypertension     PAST SURGICAL HISTORY:   Past Surgical History:  Procedure Laterality Date  . CORONARY/GRAFT ACUTE MI REVASCULARIZATION N/A 09/09/2018   Procedure: Coronary/Graft Acute MI Revascularization;  Surgeon: Yolonda Kida, MD;  Location: [facility];  Service: Cardiovascular;  Laterality: N/A;  . LEFT HEART CATH AND CORONARY ANGIOGRAPHY N/A 09/09/2018   Procedure: LEFT HEART CATH AND CORONARY ANGIOGRAPHY;  Surgeon: Yolonda Kida, MD;  Location: [facility];  Service: Cardiovascular;  Laterality: N/A;  . none    . STENT PLACEMENT VASCULAR ([facility])      SOCIAL HISTORY:   Social History   Tobacco Use  . Smoking status: Current Every Day Smoker  . Smokeless tobacco: Never Used  Substance Use Topics  . Alcohol use: Yes    Alcohol/week: 0.0 standard drinks    FAMILY HISTORY:  History reviewed. No pertinent family history.  Unobtainable due to altered mental status.  DRUG ALLERGIES:   Allergies  Allergen Reactions  . Penicillins Other (See Comments)    Patient unsure of allergy    REVIEW OF SYSTEMS:   ROS As per history of present illness. All pertinent systems were reviewed above. Constitutional, HEENT, cardiovascular, respiratory, GI, GU, musculoskeletal, neuro, psychiatric, endocrine, integumentary and hematologic systems were reviewed and are otherwise negative/unremarkable except for positive findings mentioned above in the HPI.   MEDICATIONS AT HOME:   Prior to Admission medications   Medication Sig Start  Date End Date Taking? Authorizing Provider  furosemide (LASIX) 20 MG tablet Take 1 tablet (20 mg total) by mouth daily. 09/29/20 09/29/21 Yes Nance Pear, MD  metFORMIN (GLUCOPHAGE) 500 MG tablet Take 1 tablet (500 mg total) by mouth daily with breakfast. 08/02/20 10/03/20 Yes Vladimir Crofts, MD  metoprolol succinate (TOPROL XL) 100 MG 24 hr tablet Take 1 tablet (100 mg total) by mouth daily. Take with or immediately following a meal. 08/02/20 10/03/20 Yes Vladimir Crofts, MD  aspirin EC 81 MG EC tablet Take 1 tablet (81 mg total) by mouth daily. Patient not taking: Reported on 07/16/2020 09/11/18   Demetrios Loll, MD  atorvastatin (LIPITOR) 80 MG tablet Take 1 tablet (80 mg total) by mouth daily at 6 PM. Patient not taking: Reported on 07/16/2020 09/10/18   Demetrios Loll, MD  lisinopril (PRINIVIL,ZESTRIL) 5 MG tablet Take 1 tablet (5 mg total) by mouth daily. Patient not taking: Reported on 07/16/2020 09/10/18   Demetrios Loll, MD  nitroGLYCERIN (NITROSTAT) 0.4 MG SL tablet Place 1 tablet (0.4 mg total) under the tongue every 5 (five) minutes x 3 doses as needed for chest pain. Patient not taking: Reported on 07/16/2020 09/10/18   Demetrios Loll, MD  spironolactone (ALDACTONE) 25 MG tablet Take 25 mg by mouth daily. Patient not taking: Reported on 07/16/2020    [provider]      VITAL SIGNS:  Blood pressure 124/68, pulse 73, temperature 99.8 F (37.7 C), temperature source Oral, resp. rate (!) 35, height 5\' 10"  (1.778 m), weight 81.7 kg, SpO2 100 %.  PHYSICAL EXAMINATION:  Physical Exam  GENERAL:  49 y.o.-year-old African-American male patient lying in the bed with moderate respiratory distress and conversational dyspnea. EYES: Pupils equal, round, reactive to light and accommodation. No scleral icterus. Extraocular muscles intact.  HEENT: Head atraumatic, normocephalic. Oropharynx and nasopharynx clear.  NECK:  Supple, no jugular venous distention. No thyroid enlargement, no tenderness.  LUNGS: Diminished  bibasal breath sounds with bibasal crackles. CARDIOVASCULAR: Regular rate and rhythm, S1, S2 normal. No murmurs, rubs, or gallops.  ABDOMEN: Soft, nondistended, nontender. Bowel sounds present. No organomegaly or mass.  EXTREMITIES: Trace bilateral extremity pitting edema with no clubbing or cyanosis NEUROLOGIC: Cranial nerves II through XII are intact. Muscle strength 5/5 in all extremities. Sensation intact. Gait not checked.  PSYCHIATRIC: The patient is alert and oriented x 3.  Normal affect and good eye contact. SKIN: No obvious rash, lesion, or ulcer.   LABORATORY PANEL:   CBC Recent Labs  Lab 10/04/20 0517  WBC 8.4  HGB 8.3*  HCT 28.7*  PLT 242   ------------------------------------------------------------------------------------------------------------------  Chemistries  Recent Labs  Lab 10/03/20 2237 10/03/20 2237 10/04/20 0517  NA 134*   < > 136  K 4.4   < > 4.0  CL 102   < > 105  CO2 24   < > 24  GLUCOSE 223*   < > 308*  BUN 12   < > 11  CREATININE 1.17   < > 1.06  CALCIUM  7.7*   < > 7.6*  MG 2.2  --   --   AST 37  --   --   ALT 31  --   --   ALKPHOS 105  --   --   BILITOT 0.6  --   --    < > = values in this interval not displayed.   ------------------------------------------------------------------------------------------------------------------  Cardiac Enzymes No results for input(s): TROPONINI in the last 168 hours. ------------------------------------------------------------------------------------------------------------------  RADIOLOGY:  CT Head Wo Contrast  Result Date: 10/03/2020 CLINICAL DATA:  Altered mental status. EXAM: CT HEAD WITHOUT CONTRAST TECHNIQUE: Contiguous axial images were obtained from the base of the skull through the vertex without intravenous contrast. COMPARISON:  June 02, 2016 FINDINGS: Brain: No evidence of acute infarction, hemorrhage, hydrocephalus, extra-axial collection or mass lesion/mass effect. Vascular: No  hyperdense vessel or unexpected calcification. Skull: Normal. Negative for fracture or focal lesion. Sinuses/Orbits: There is marked severity bilateral ethmoid sinus and sphenoid sinus mucosal thickening. Other: None. IMPRESSION: 1. No acute intracranial abnormality. 2. Marked severity bilateral ethmoid sinus and sphenoid sinus disease. Electronically Signed   By: Virgina Norfolk M.D.   On: 10/03/2020 23:25   CT ABDOMEN PELVIS W CONTRAST  Result Date: 10/04/2020 CLINICAL DATA:  Abdominal distension and shortness of breath. Sepsis and cocaine use. EXAM: CT ABDOMEN AND PELVIS WITH CONTRAST TECHNIQUE: Multidetector CT imaging of the abdomen and pelvis was performed using the standard protocol following bolus administration of intravenous contrast. CONTRAST:  133mL OMNIPAQUE IOHEXOL 300 MG/ML  SOLN COMPARISON:  Abdominal radiographs 06/02/2016 FINDINGS: Lower chest: Motion artifact limits examination but there appear to be patchy infiltrates in the lung bases. Possibly pneumonia or edema. Cardiac enlargement. Hepatobiliary: Mild diffuse fatty infiltration of the liver. The gallbladder is decompressed. No bile duct dilatation. Pancreas: Unremarkable. No pancreatic ductal dilatation or surrounding inflammatory changes. Spleen: Normal in size without focal abnormality. Adrenals/Urinary Tract: Adrenal glands are unremarkable. Kidneys are normal, without renal calculi, focal lesion, or hydronephrosis. Bladder is unremarkable. Stomach/Bowel: Stomach is within normal limits. Appendix appears normal. No evidence of bowel wall thickening, distention, or inflammatory changes. Vascular/Lymphatic: Aortic atherosclerosis. No enlarged abdominal or pelvic lymph nodes. Reproductive: Prostate is unremarkable. Other: No abdominal wall hernia or abnormality. No abdominopelvic ascites. Musculoskeletal: No acute or significant osseous findings. IMPRESSION: 1. Patchy infiltrates in the lung bases may indicate pneumonia or edema. 2.  Mild diffuse fatty infiltration of the liver. 3. No evidence of bowel obstruction or inflammation. 4. Aortic atherosclerosis. Aortic Atherosclerosis (ICD10-I70.0). Electronically Signed   By: Lucienne Capers M.D.   On: 10/04/2020 06:17   DG Chest Port 1 View  Result Date: 10/03/2020 CLINICAL DATA:  49 year old male with altered mental status, hyperglycemia. EXAM: PORTABLE CHEST 1 VIEW COMPARISON:  Chest radiographs 09/28/2020 and earlier. FINDINGS: Portable AP upright view at 2247 hours. Pacer or resuscitation pads project over the central chest. Stable cardiomegaly and mediastinal contours. Stable lung volumes. Resolved bilateral Patchy and confluent pulmonary opacity seen on 07/15/2020. Residual increased somewhat coarse interstitial markings in both lungs appear chronic, although probably not significantly changed since 2017. No superimposed pneumothorax, pulmonary edema, pleural effusion or confluent pulmonary opacity. No acute osseous abnormality identified. Negative visible bowel gas pattern. IMPRESSION: Stable.  No acute cardiopulmonary abnormality. Electronically Signed   By: Genevie Ann M.D.   On: 10/03/2020 22:59      IMPRESSION AND PLAN:   1.  SIRS, rule out sepsis of questionable etiology at this time.  The patient has fever, tachycardia and tachypnea.  Given altered mental status he could be having severe sepsis though this could be related to his acute respiratory failure with hypercarbia.  I cannot rule out community-acquired pneumonia in his case. -The patient will be admitted to a progressive cardiac unit bed. -She will be placed on BiPAP and will follow ABG. -We will continue antibiotic therapy with empiric treatment with IV cefepime, vancomycin and Flagyl. -We will follow blood cultures and obtain sputum Gram stain culture and sensitivity.  2.  Acute on chronic combined systolic and diastolic CHF.  This is clearly contributing to his acute respiratory failure. -The patient will be  diuresed with IV Lasix. -We will continue Aldactone. -We will follow troponin I's. -Cardiology consult will be obtained. -I notified Dr. Claris Gower about the patient.  3.  Brief torsade De pointes. -The patient received 2 g of IV magnesium sulfate we will continue monitoring him.  4.  Essential hypertension. -Continue Toprol-XL and lisinopril.  5.  Coronary artery disease. -We will continue beta-blocker therapy, lisinopril, statin therapy and aspirin.  6.  Type 2 diabetes mellitus. -We will hold off Metformin and place the patient on supplemental coverage with NovoLog.  7.  DVT prophylaxis. The subtenons Lovenox.  All the records are reviewed and case discussed with ED provider. The plan of care was discussed in details with the patient (and family). I answered all questions. The patient agreed to proceed with the above mentioned plan. Further management will depend upon hospital course.   CODE STATUS: Full code  Status is: Inpatient  Remains inpatient appropriate because:Hemodynamically unstable, Ongoing diagnostic testing needed not appropriate for outpatient work up, Unsafe d/c plan, IV treatments appropriate due to intensity of illness or inability to take PO and Inpatient level of care appropriate due to severity of illness   Dispo: The patient is from: Home              Anticipated d/c is to: Home              Anticipated d/c date is: 3 days              Patient currently is not medically stable to d/c.   TOTAL TIME TAKING CARE OF THIS PATIENT: 55 minutes.    Christel Mormon M.D on 10/04/2020 at 7:01 AM  [facility]   From 7 PM-7 AM, contact night-coverage [email or web address]  CC: Primary care physician; Patient, No Pcp Per

## 2020-10-05 ENCOUNTER — Encounter: Payer: Self-pay | Admitting: Family Medicine

## 2020-10-05 LAB — GLUCOSE, CAPILLARY
Glucose-Capillary: 173 mg/dL — ABNORMAL HIGH (ref 70–99)
Glucose-Capillary: 203 mg/dL — ABNORMAL HIGH (ref 70–99)
Glucose-Capillary: 252 mg/dL — ABNORMAL HIGH (ref 70–99)
Glucose-Capillary: 293 mg/dL — ABNORMAL HIGH (ref 70–99)

## 2020-10-05 LAB — MAGNESIUM: Magnesium: 2.4 mg/dL (ref 1.7–2.4)

## 2020-10-05 LAB — BASIC METABOLIC PANEL
Anion gap: 9 (ref 5–15)
BUN: 11 mg/dL (ref 6–20)
CO2: 25 mmol/L (ref 22–32)
Calcium: 8.2 mg/dL — ABNORMAL LOW (ref 8.9–10.3)
Chloride: 104 mmol/L (ref 98–111)
Creatinine, Ser: 1.07 mg/dL (ref 0.61–1.24)
GFR, Estimated: >60 mL/min (ref >60)
Glucose, Bld: 207 mg/dL — ABNORMAL HIGH (ref 70–99)
Potassium: 4.4 mmol/L (ref 3.5–5.1)
Sodium: 138 mmol/L (ref 135–145)

## 2020-10-05 LAB — HEMOGLOBIN A1C
Hgb A1c MFr Bld: 9 % — ABNORMAL HIGH (ref 4.8–5.6)
Mean Plasma Glucose: 212 mg/dL

## 2020-10-05 LAB — CBC
HCT: 29.4 % — ABNORMAL LOW (ref 39.0–52.0)
Hemoglobin: 8.7 g/dL — ABNORMAL LOW (ref 13.0–17.0)
MCH: 21.8 pg — ABNORMAL LOW (ref 26.0–34.0)
MCHC: 29.6 g/dL — ABNORMAL LOW (ref 30.0–36.0)
MCV: 73.7 fL — ABNORMAL LOW (ref 80.0–100.0)
Platelets: 251 10*3/uL (ref 150–400)
RBC: 3.99 MIL/uL — ABNORMAL LOW (ref 4.22–5.81)
RDW: 17.7 % — ABNORMAL HIGH (ref 11.5–15.5)
WBC: 8 10*3/uL (ref 4.0–10.5)
nRBC: 0 % (ref 0.0–0.2)

## 2020-10-05 LAB — IRON AND TIBC
Iron: 15 ug/dL — ABNORMAL LOW (ref 45–182)
Saturation Ratios: 4 % — ABNORMAL LOW (ref 17.9–39.5)
TIBC: 365 ug/dL (ref 250–450)
UIBC: 350 ug/dL

## 2020-10-05 LAB — FOLATE: Folate: 16 ng/mL

## 2020-10-05 LAB — PROCALCITONIN: Procalcitonin: <0.1 ng/mL

## 2020-10-05 LAB — VITAMIN B12: Vitamin B-12: 585 pg/mL (ref 180–914)

## 2020-10-05 LAB — FERRITIN: Ferritin: 16 ng/mL — ABNORMAL LOW (ref 24–336)

## 2020-10-05 LAB — URINE CULTURE: Culture: NO GROWTH

## 2020-10-05 MED ORDER — LOSARTAN POTASSIUM 50 MG PO TABS
50.0000 mg | ORAL_TABLET | Freq: Every day | ORAL | Status: DC
  Filled 2020-10-05: qty 1

## 2020-10-05 MED ORDER — FLUTICASONE PROPIONATE 50 MCG/ACT NA SUSP
2.0000 | Freq: Every day | NASAL | Status: DC
  Administered 2020-10-05: 2 via NASAL
  Filled 2020-10-05: qty 16

## 2020-10-05 MED ORDER — LOSARTAN POTASSIUM 25 MG PO TABS: 25.0000 mg | ORAL_TABLET | Freq: Every day | ORAL | 0 refills | Status: DC

## 2020-10-05 MED ORDER — CARVEDILOL 3.125 MG PO TABS
3.1250 mg | ORAL_TABLET | Freq: Two times a day (BID) | ORAL | 0 refills | Status: DC
Start: 2020-10-05 — End: 2021-02-19

## 2020-10-05 MED ORDER — FLUTICASONE PROPIONATE 50 MCG/ACT NA SUSP: 2.0000 | Freq: Every day | NASAL | 0 refills | Status: DC

## 2020-10-05 MED ORDER — DOXYCYCLINE HYCLATE 100 MG PO TABS
100.0000 mg | ORAL_TABLET | Freq: Two times a day (BID) | ORAL | 0 refills | Status: DC
Start: 2020-10-05 — End: 2021-02-12

## 2020-10-05 MED ORDER — LOSARTAN POTASSIUM 50 MG PO TABS: 50.0000 mg | ORAL_TABLET | Freq: Every day | ORAL | 0 refills | Status: DC

## 2020-10-05 MED ORDER — GABAPENTIN 100 MG PO CAPS: 100.0000 mg | ORAL_CAPSULE | Freq: Every day | ORAL | Status: DC

## 2020-10-05 MED ORDER — DAPAGLIFLOZIN PROPANEDIOL 5 MG PO TABS
5.0000 mg | ORAL_TABLET | Freq: Every day | ORAL | 0 refills | Status: DC
Start: 2020-10-05 — End: 2021-02-19

## 2020-10-05 MED ORDER — TRAMADOL HCL 50 MG PO TABS
50.0000 mg | ORAL_TABLET | Freq: Four times a day (QID) | ORAL | Status: DC | PRN
  Administered 2020-10-05: 50 mg via ORAL
  Filled 2020-10-05: qty 1

## 2020-10-05 MED ORDER — DAPAGLIFLOZIN PROPANEDIOL 5 MG PO TABS
5.0000 mg | ORAL_TABLET | Freq: Every day | ORAL | Status: DC
  Filled 2020-10-05: qty 1

## 2020-10-05 MED ORDER — SALINE SPRAY 0.65 % NA SOLN
1.0000 | NASAL | Status: DC | PRN
  Filled 2020-10-05: qty 44

## 2020-10-05 MED ORDER — BENZONATATE 100 MG PO CAPS: 100.0000 mg | ORAL_CAPSULE | Freq: Three times a day (TID) | ORAL | 0 refills | Status: AC | PRN

## 2020-10-05 MED ORDER — DM-GUAIFENESIN ER 30-600 MG PO TB12
1.0000 | ORAL_TABLET | Freq: Two times a day (BID) | ORAL | Status: DC
  Administered 2020-10-05: 1 via ORAL
  Filled 2020-10-05: qty 1

## 2020-10-05 MED ORDER — SPIRONOLACTONE 25 MG PO TABS: 25.0000 mg | ORAL_TABLET | Freq: Every day | ORAL | 0 refills | Status: DC

## 2020-10-05 MED ORDER — GABAPENTIN 100 MG PO CAPS
100.0000 mg | ORAL_CAPSULE | Freq: Every day | ORAL | 0 refills | Status: DC
Start: 2020-10-05 — End: 2021-02-19

## 2020-10-05 MED ORDER — GUAIFENESIN ER 600 MG PO TB12: 600.0000 mg | ORAL_TABLET | Freq: Two times a day (BID) | ORAL | 0 refills | Status: DC

## 2020-10-05 MED ORDER — FUROSEMIDE 20 MG PO TABS: 20.0000 mg | ORAL_TABLET | Freq: Every day | ORAL | 0 refills | Status: DC | PRN

## 2020-10-05 MED ORDER — BUTALBITAL-APAP-CAFFEINE 50-325-40 MG PO TABS: 1.0000 | ORAL_TABLET | Freq: Four times a day (QID) | ORAL | Status: DC | PRN

## 2020-10-05 MED ORDER — DOXYCYCLINE HYCLATE 100 MG PO TABS
100.0000 mg | ORAL_TABLET | Freq: Two times a day (BID) | ORAL | Status: DC
  Administered 2020-10-05: 100 mg via ORAL
  Filled 2020-10-05: qty 1

## 2020-10-05 NOTE — TOC Transition Note (Signed)
Transition of Care [facility]) - CM/SW Discharge Note   Patient Details  Name: Joshua [name] MRN: [id number] Date of Birth: [birth year]  [facility] CM/SW Contact:  Harriet Masson, RN Phone Number: 10/05/2020, 12:49 PM   Clinical Narrative:    Initially RN spoke with via telephonically to assess his needs. Attending requested several needs (PCP, transportation for pt to obtain his medications via [facility] transport and substance abuse resources). Pt was not receptive with the only response "I have no where to go". RN offered homeless shelter and the call ended. RN attempted bedside visit with the following information: Substance Abuse resources as counseling was requested, information on how to obtain a provided via pt Engagement [facility]  contact number, [facility] transportation service through ([employer]) with contact number, local homeless shelters and a transportation voucher to [facility] to obtain his medication that Dr. Posey Pronto has called into [facility] for this pt. ED notes indicated pt can from a home place and family called EMS however pt indicates this is not correct. Pt refused to speak with this RN case manager with any of the available resources. RN provided information to the bedside nurse to include in his packet or if pt willing to discuss later RN will be available once again to present. Due to pt's initiation and request for the bedside RN to take his IV lines out RN requested the floor supervisor to be available for pt and Bedside nurse's safety.   Dr. Posey Pronto updated on all resources provided to this pt for his discharge. No other needs or request at this time.   TOC team will be available if additional resources are needed.    Final next level of care: Home/Self Care Barriers to Discharge: Barriers Resolved (Available resources provider however pt refused. Resources in d/c packet for finding a PCP, Substance abuse, transportation resources via [facility] for further transport  services and transportation voucher to [facility] to for medications refused home address)   Patient Goals and CMS Choice        Discharge Placement                       Discharge Plan and Services                                     Social Determinants of Health (SDOH) Interventions     Readmission Risk Interventions Readmission Risk Prevention Plan 09/10/2018  Post Dischage Appt Complete  Medication Screening Complete  Transportation Screening Complete  PCP follow-up Complete  Some recent data might be hidden

## 2020-10-05 NOTE — Discharge Instructions (Signed)
Heart Failure, Self Care °Heart failure is a serious condition. This sheet explains things you need to do to take care of yourself at home. To help you stay as healthy as possible, you may be asked to change your diet, take certain medicines, and make other changes in your life. Your doctor may also give you more specific instructions. If you have problems or questions, call your doctor. °What are the risks? °Having heart failure makes it more likely for you to have some problems. These problems can get worse if you do not take good care of yourself. Problems may include: °· Blood clotting problems. This may cause a stroke. °· Damage to the kidneys, liver, or lungs. °· Abnormal heart rhythms. °Supplies needed: °· Scale for weighing yourself. °· Blood pressure monitor. °· Notebook. °· Medicines. °How to care for yourself when you have heart failure °Medicines °Take over-the-counter and prescription medicines only as told by your doctor. Take your medicines every day. °· Do not stop taking your medicine unless your doctor tells you to do so. °· Do not skip any medicines. °· Get your prescriptions refilled before you run out of medicine. This is important. °Eating and drinking ° °· Eat heart-healthy foods. Talk with a diet specialist (dietitian) to create an eating plan. °· Choose foods that: °? Have no trans fat. °? Are low in saturated fat and cholesterol. °· Choose healthy foods, such as: °? Fresh or frozen fruits and vegetables. °? Fish. °? Low-fat (lean) meats. °? Legumes, such as beans, peas, and lentils. °? Fat-free or low-fat dairy products. °? Whole-grain foods. °? High-fiber foods. °· Limit salt (sodium) if told by your doctor. Ask your diet specialist to tell you which seasonings are healthy for your heart. °· Cook in healthy ways instead of frying. Healthy ways of cooking include roasting, grilling, broiling, baking, poaching, steaming, and stir-frying. °· Limit how much fluid you drink, if told by your  doctor. °Alcohol use °· Do not drink alcohol if: °? Your doctor tells you not to drink. °? Your heart was damaged by alcohol, or you have very bad heart failure. °? You are pregnant, may be pregnant, or are planning to become pregnant. °· If you drink alcohol: °? Limit how much you use to: °§ 0-1 drink a day for women. °§ 0-2 drinks a day for men. °? Be aware of how much alcohol is in your drink. In the [place] one drink equals one 12 oz bottle of beer (355 mL), one 5 oz glass of wine (148 mL), or one 1½ oz glass of hard liquor (44 mL). °Lifestyle ° °· Do not use any products that contain nicotine or tobacco, such as cigarettes, e-cigarettes, and chewing tobacco. If you need help quitting, ask your doctor. °? Do not use nicotine gum or patches before talking to your doctor. °· Do not use illegal drugs. °· Lose weight if told by your doctor. °· Do physical activity if told by your doctor. Talk to your doctor before you begin an exercise if: °? You are an older adult. °? You have very bad heart failure. °· Learn to manage stress. If you need help, ask your doctor. °· Get rehab (rehabilitation) to help you stay independent and to help with your quality of life. °· Plan time to rest when you get tired. °Check weight and blood pressure ° °· Weigh yourself every day. This will help you to know if fluid is building up in your body. °? Weigh yourself every morning   after you pee (urinate) and before you eat breakfast. ? Wear the same amount of clothing each time. ? Write down your daily weight. Give your record to your doctor.  Check and write down your blood pressure as told by your doctor.  Check your pulse as told by your doctor. Dealing with very hot and very cold weather  If it is very hot: ? Avoid activities that take a lot of energy. ? Use air conditioning or fans, or find a cooler place. ? Avoid caffeine and alcohol. ? Wear clothing that is loose-fitting, lightweight, and light-colored.  If it is very  cold: ? Avoid activities that take a lot of energy. ? Layer your clothes. ? Wear mittens or gloves, a hat, and a scarf when you go outside. ? Avoid alcohol. Follow these instructions at home:  Stay up to date with shots (vaccines). Get pneumococcal and flu (influenza) shots.  Keep all follow-up visits as told by your doctor. This is important. Contact a doctor if:  You gain weight quickly.  You have increasing shortness of breath.  You cannot do your normal activities.  You get tired easily.  You cough a lot.  You don't feel like eating or feel like you may vomit (nauseous).  You become puffy (swell) in your hands, feet, ankles, or belly (abdomen).  You cannot sleep well because it is hard to breathe.  You feel like your heart is beating fast (palpitations).  You get dizzy when you stand up. Get help right away if:  You have trouble breathing.  You or someone else notices a change in your behavior, such as having trouble staying awake.  You have chest pain or discomfort.  You pass out (faint). These symptoms may be an emergency. Do not wait to see if the symptoms will go away. Get medical help right away. Call your local emergency services (911 in the [place] Do not drive yourself to the hospital. Summary  Heart failure is a serious condition. To care for yourself, you may have to change your diet, take medicines, and make other lifestyle changes.  Take your medicines every day. Do not stop taking them unless your doctor tells you to do so.  Eat heart-healthy foods, such as fresh or frozen fruits and vegetables, fish, lean meats, legumes, fat-free or low-fat dairy products, and whole-grain or high-fiber foods.  Ask your doctor if you can drink alcohol. You may have to stop alcohol use if you have very bad heart failure.  Contact your doctor if you gain weight quickly or feel that your heart is beating too fast. Get help right away if you pass out, or have chest pain  or trouble breathing. This information is not intended to replace advice given to you by your health care provider. Make sure you discuss any questions you have with your health care provider. Document Revised: 03/14/2019 Document Reviewed: 03/15/2019 Elsevier Patient Education  [place]. Stimulant Use Disorder-Cocaine Cocaine belongs to a group of powerful drugs known as stimulants. Common street names for cocaine include coke, crack, blow, snow, C, powder, and nose candy. Cocaine has some medical uses, but it is often misused because of the effects that it produces. These effects include:  A feeling of extreme pleasure (euphoria).  Alertness.  A high energy level. Stimulant use disorder is when your stimulant use disrupts your daily life. It may disrupt your relationships and how you do your job. Stimulant use disorder can be dangerous. Cocaine increases your blood  pressure and heart rate. Using it can lead to a heart attack or stroke. Cocaine can also make your heart rate irregular and cause seizures. These problems can lead to death. What are the causes? This condition is caused by misusing cocaine. Many people start using cocaine because it makes them feel good. Over time, they get addicted to it. When they try to stop using it, they feel sick. What increases the risk? This condition is more likely to develop in:  People who misuse other drugs.  People with a family history of misusing drugs. What are the signs or symptoms? Symptoms of this condition include:  Using greater amounts of cocaine than you want to, or using cocaine for longer than you want to.  Trying several times to use less cocaine or to control your cocaine use.  Craving cocaine.  Spending a lot of time getting cocaine, using it, or recovering from its effects.  Having problems at work, at school, at home, or with relationships because of cocaine use.  Giving up or cutting down on important life  activities because of cocaine use.  Using cocaine when it is dangerous, such as when driving a car.  Continuing to use cocaine even though it is causing or has led to a physical problem, such as: ? Malnutrition. ? Nosebleeds. ? Chest pain. ? High blood pressure. ? A hole between the part of your nose that separates your nostrils (perforated nasal septum). ? Lung and kidney damage.  Continuing to use cocaine even though it is causing a mental problem, such as: ? Schizophrenia-like symptoms. ? Depression. ? Bipolar mood swings. ? Anxiety. ? Sleep problems.  Needing more and more cocaine to get the same effect that you want (building up a tolerance).  Having symptoms of withdrawal when you stop using cocaine. Symptoms of withdrawal include: ? Depression. ? Irritability. ? Low energy. ? Restlessness. ? Bad dreams. ? Too little or too much sleep. ? Increased appetite. How is this diagnosed? This condition is diagnosed with an assessment. During the assessment, your health care provider will ask about your cocaine use and about how it affects your life. Your health care provider may also:  Perform a physical exam or do lab tests to see if you have physical problems resulting from cocaine use.  Screen for drug use.  Refer you to a mental health professional for evaluation. How is this treated? Treatment for this condition is usually provided by mental health professionals with training in substance use disorders. Treatment may involve:  Counseling. This treatment is also called talk therapy. It is provided by substance use treatment counselors. A counselor can address the reasons you use cocaine and suggest ways to keep you from using it again. The goals of talk therapy are to: ? Find healthy activities to replace using cocaine. ? Identify and avoid what triggers your cocaine use. ? Help you learn how to handle cravings.  Support groups. Support groups are run by people who have  quit using stimulants. They provide emotional support, advice, and guidance.  Medicines. Follow these instructions at home:  Take over-the-counter and prescription medicines only as told by your health care provider.  Check with your health care provider before starting any new medicines.  Do not use any products that contain nicotine or tobacco, such as cigarettes and e-cigarettes. If you need help quitting, ask your health care provider.  Keep all follow-up visits as told by your health care provider. This is important. Where to find  more information  [employer] on Drug Abuse: motorcyclefax.com  Substance Abuse and Mental Health Services Administration: ktimeonline.com Contact a health care provider if:  You are not able to take your medicines as told.  You use cocaine again.  Your symptoms get worse. Get help right away if:  You have serious thoughts about hurting yourself or others.  You have a seizure.  You have chest pain.  You have sudden weakness.  You lose some of your vision.  You lose some of your speech. If you ever feel like you may hurt yourself or others, or have thoughts about taking your own life, get help right away. You can go to your nearest emergency department or call:  Your local emergency services (911 in the [place]  A suicide crisis helpline, such as the [place] at (458) 348-3936. This is open 24 hours a day. This information is not intended to replace advice given to you by your health care provider. Make sure you discuss any questions you have with your health care provider. Document Revised: 11/12/2017 Document Reviewed: 09/11/2016 Elsevier Patient Education  2020 [employer].

## 2020-10-05 NOTE — Progress Notes (Signed)
[name]. Patient is [facility] and was offered two taxi vouchers, one to [facility] to [facility] When the social worker when in his room to discuss this with him, the patient started yelling at Education officer, museum and would not answer her question about the second voucher. Patient just received the one to [facility]. Patient was very upset about his discharge.  Discussed prescriptions and follow up appointments with the patient. Prescriptions given to patient, medication list explained in detail. Patient refused to respond when this information was given.   Allergies as of 10/05/2020       Reactions   Penicillins Other (See Comments)   Patient unsure of allergy        Medication List     STOP taking these medications    lisinopril 5 MG tablet Commonly known as: ZESTRIL   metoprolol succinate 100 MG 24 hr tablet Commonly known as: Toprol XL       TAKE these medications    aspirin 81 MG EC tablet Take 1 tablet (81 mg total) by mouth daily.   atorvastatin 80 MG tablet Commonly known as: LIPITOR Take 1 tablet (80 mg total) by mouth daily at 6 PM.   benzonatate 100 MG capsule Commonly known as: Tessalon Perles Take 1 capsule (100 mg total) by mouth 3 (three) times daily as needed for up to 5 days for cough.   carvedilol 3.125 MG tablet Commonly known as: COREG Take 1 tablet (3.125 mg total) by mouth 2 (two) times daily with a meal.   dapagliflozin propanediol 5 MG Tabs tablet Commonly known as: FARXIGA Take 1 tablet (5 mg total) by mouth daily.   doxycycline 100 MG tablet Commonly known as: VIBRA-TABS Take 1 tablet (100 mg total) by mouth every 12 (twelve) hours.   fluticasone 50 MCG/ACT nasal spray Commonly known as: FLONASE Place 2 sprays into both nostrils daily.   furosemide 20 MG tablet Commonly known as: Lasix Take 1 tablet (20 mg total) by mouth daily as needed (for weight  gain >3lbs in 1 days and >5lbs in 2 days). What changed:  when to take this reasons to take this   gabapentin 100 MG capsule Commonly known as: NEURONTIN Take 1 capsule (100 mg total) by mouth at bedtime.   guaiFENesin 600 MG 12 hr tablet Commonly known as: Mucinex Take 1 tablet (600 mg total) by mouth 2 (two) times daily.   losartan 25 MG tablet Commonly known as: Cozaar Take 1 tablet (25 mg total) by mouth daily.   metFORMIN 500 MG tablet Commonly known as: Glucophage Take 1 tablet (500 mg total) by mouth daily with breakfast.   nitroGLYCERIN 0.4 MG SL tablet Commonly known as: NITROSTAT Place 1 tablet (0.4 mg total) under the tongue every 5 (five) minutes x 3 doses as needed for chest pain.   spironolactone 25 MG tablet Commonly known as: ALDACTONE Take 1 tablet (25 mg total) by mouth daily.        Vitals:   10/05/20 1211 10/05/20 1500  BP: 99/68   Pulse: 80   Resp: 20   Temp: 97.8 F (36.6 C)   SpO2: 95% 99%    Skin clean, dry and intact without evidence of skin break down, no evidence of skin tears noted. IV catheter discontinued intact. Site without signs and symptoms of complications. Dressing and pressure applied. Pt denies pain at this time. No  complaints noted.  An After Visit Summary was printed and given to the patient. Patient refused to be escorted out with a WC, and D/C home via [place].  Joshua [name]

## 2020-10-05 NOTE — Discharge Summary (Addendum)
[facility] Discharge Summary   Patient: Joshua [name] BHA:[id number]  PCP: Patient, No Pcp Per  Date of admission: 10/03/2020   Date of discharge: 10/05/2020      Discharge Diagnoses:  Principal diagnosis Community-acquired pneumonia.  Active Problems:   SIRS (systemic inflammatory response syndrome) (HCC) Polysubstance abuse. Chronic systolic CHF  Admitted From: Home Disposition:  Home   Recommendations for Outpatient Follow-up:  1. PCP: Follow-up with PCP follow-up with cardiology. 2. Follow up LABS/TEST: Discussed regarding initiating Entresto   Follow-up Information    PCP. Schedule an appointment as soon as possible for a visit in 1 week(s).        Callwood, Dwayne D, MD. Schedule an appointment as soon as possible for a visit in 1 month(s).   Specialties: Cardiology, Internal Medicine Contact information: [place] [phone]              Diet recommendation: Cardiac diet  Activity: The patient is advised to gradually reintroduce usual activities, as tolerated  Discharge Condition: stable  Code Status: Full code   History of present illness: As per the H and P dictated on admission, "Joshua [name]  is a 49 y.o. 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  Is been having fever and chills.  He denied any nausea or vomiting or abdominal pain.  No dysuria, oliguria or hematuria or flank pain.  He was slightly somnolent but arousable during my interview.  When he came to the ER, temperature was 103 orally and respiratory rate was initially 20 and later on 37-39 with otherwise normal  vital signs..  Labs revealed ABG with pH 7.4 with PCO2 52 and PO2 of 31 HCO3 32.2 and O2 sat of 32% on room air initially.  On O2 by nasal cannula he maintained O2 sat of 94 to 98%.  CBC showed anemia close to baseline and CMP revealed hyperglycemia.  Influenza antigens came back negative as well as COVID-19 PCR.  BNP came back 815.9 and high-sensitivity troponin I was 10.  Urinalysis came back negative and urine drug screen was positive for cocaine and cannabinoids.  Alcohol levels was less than 10. EKG showed sinus rhythm with rate of 98 with probable left atrial enlargement and anteroseptal Q waves.  Chest x-ray showed no acute cardiopulmonary disease it revealed resolved bilateral patchy and confluent pulmonary opacity seen on 07/15/2020.  The patient was given a gram of IV ceftriaxone and a gram of IV vancomycin, 500 mill IV normal saline bolus and 2 g of IV magnesium sulfate.  He became more short of breath with tachypnea and use of accessory muscles.  Repeat ABG was ordered.  He will be placed on BiPAP.  He will be admitted to progressive cardiac unit bed for further evaluation and management."  Hospital Course:  Summary of his active problems in the hospital is as following. Acute hypoxic respiratory failure  acute metabolic encephalopathy. Community-acquired pneumonia. SIRS/sepsis ruled out. Nasal stuffiness. EMS called to patient's home for confusion with hyperglycemia.  On admission had a temperature of 103 with leukocytosis. Patient was started on BiPAP Next day during my evaluation in the morning the patient was  drowsy. Mid conversation will go to sleep.  Required multiple times effort to wake him up from sleep during conversation. As the day progressed mentation improved. ABG improved without any hypercarbia. Able to tolerate room air oxygen. Patient was able to swallow safely per RN. Initially started on IV vancomycin and cefepime.  Blood cultures negative for 2 days with We will  discontinue IV antibiotics and transition to oral doxycycline. Patient reported somE stuffy nose. Flonase ordered. Patient also reports cough with some abdominal discomfort secondary to coughing. Mucinex and Tessalon Perles added.  Concern for torsades-ruled out. Last night patient had an unresponsive event at which time telemetry was showing torsades. I reviewed patient's telemetry at 23:21 which listed as V. tach, 22:58 listed as V. fib, 23:50 listed as V. fib and 23;50 listed as V. Tach. All the events are artifact actually with QRS complex marching through the V. fib/V. tach pattern clearly seen.  I do not feel that the patient actually has any torsades or V. tach or V. Fib.  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 Currently does appear euvolemic. We will hold off on cardiology consultation. Switch Toprol-XL to Coreg given his cocaine abuse. Added losartan low-dose. Outpatient follow-up with cardiology for [facility]. Patient has been noncompliant with his follow-up with cardiology here as well as cardiology at [facility].  Pain control  Reported history of diabetic neuropathy and anxiety. - [place] Controlled Substance Reporting System database was reviewed. -Gabapentin ordered.  Type 2 diabetes mellitus without any complication. Continue Metformin. Added Wilder Glade given his low EF.  Patient was ambulatory without any assistance. Presentation with confusion but likely in setting of substance abuse.  Mentation back to normal.  Patient able to tolerate a p.o. diet and able to perform ADLs on his own.  There was concern for pneumonia and patient has concerns for stuffy nose, patient remains on room air, negative culture and normal WBC.  Orthostatic vitals are negative.  Telemetry negative for any acute event. Social worker was consulted to assist with the patient's homeless situation. Patient reported concern regarding transportation to pharmacy and social  worker was able to provide vouchers for that. Social worker was also consulted to provide further information about transportation for Medicaid patients as well as PCP options as well as substance abuse assistance although was unable to provide information as the patient declined further discussion after hearing shelter options. Patient at present is medically stable for discharge. On the day of the discharge the patient's vitals were stable, and no other acute medical condition were reported by patient. The patient was felt safe to be discharge at Home with no therapy needed on discharge.  Consultants: none Procedures: none  Discharge Exam: General: Appear in no distress, no Rash; Oral Mucosa Clear, moist. no Abnormal Neck Mass Or lumps, Conjunctiva normal  Cardiovascular: S1 and S2 Present, no Murmur Respiratory: good respiratory effort, Bilateral Air entry present and CTA, no Crackles, no wheezes Abdomen: Bowel Sound present, Soft and no tenderness Extremities: no Pedal edema Neurology: alert and oriented to time, place, and person affect appropriate. no new focal deficit  Filed Weights   10/03/20 2229  Weight: 81.7 kg   Vitals:   10/05/20 1211 10/05/20 1500  BP: 99/68   Pulse: 80   Resp: 20   Temp: 97.8 F (36.6 C)   SpO2: 95% 99%    DISCHARGE MEDICATION: Allergies as of 10/05/2020      Reactions   Penicillins Other (See Comments)  Patient unsure of allergy      Medication List    STOP taking these medications   lisinopril 5 MG tablet Commonly known as: ZESTRIL   metoprolol succinate 100 MG 24 hr tablet Commonly known as: Toprol XL     TAKE these medications   aspirin 81 MG EC tablet Take 1 tablet (81 mg total) by mouth daily.   atorvastatin 80 MG tablet Commonly known as: LIPITOR Take 1 tablet (80 mg total) by mouth daily at 6 PM.   benzonatate 100 MG capsule Commonly known as: Tessalon Perles Take 1 capsule (100 mg total) by mouth 3 (three) times daily  as needed for up to 5 days for cough.   carvedilol 3.125 MG tablet Commonly known as: COREG Take 1 tablet (3.125 mg total) by mouth 2 (two) times daily with a meal.   dapagliflozin propanediol 5 MG Tabs tablet Commonly known as: FARXIGA Take 1 tablet (5 mg total) by mouth daily.   doxycycline 100 MG tablet Commonly known as: VIBRA-TABS Take 1 tablet (100 mg total) by mouth every 12 (twelve) hours.   fluticasone 50 MCG/ACT nasal spray Commonly known as: FLONASE Place 2 sprays into both nostrils daily.   furosemide 20 MG tablet Commonly known as: Lasix Take 1 tablet (20 mg total) by mouth daily as needed (for weight gain >3lbs in 1 days and >5lbs in 2 days). What changed:   when to take this  reasons to take this   gabapentin 100 MG capsule Commonly known as: NEURONTIN Take 1 capsule (100 mg total) by mouth at bedtime.   guaiFENesin 600 MG 12 hr tablet Commonly known as: Mucinex Take 1 tablet (600 mg total) by mouth 2 (two) times daily.   losartan 25 MG tablet Commonly known as: Cozaar Take 1 tablet (25 mg total) by mouth daily.   metFORMIN 500 MG tablet Commonly known as: Glucophage Take 1 tablet (500 mg total) by mouth daily with breakfast.   nitroGLYCERIN 0.4 MG SL tablet Commonly known as: NITROSTAT Place 1 tablet (0.4 mg total) under the tongue every 5 (five) minutes x 3 doses as needed for chest pain.   spironolactone 25 MG tablet Commonly known as: ALDACTONE Take 1 tablet (25 mg total) by mouth daily.      Allergies  Allergen Reactions  . Penicillins Other (See Comments)    Patient unsure of allergy   Discharge Instructions    Ambulatory referral to Cardiology   Complete by: As directed    Diet - low sodium heart healthy   Complete by: As directed    Increase activity slowly   Complete by: As directed       The results of significant diagnostics from this hospitalization (including imaging, microbiology, ancillary and laboratory) are listed  below for reference.    Significant Diagnostic Studies: DG Chest 2 View  Result Date: 09/28/2020 CLINICAL DATA:  Shortness of breath.  History of CHF. EXAM: CHEST - 2 VIEW COMPARISON:  08/02/2020 FINDINGS: The cardiac silhouette remains enlarged. The lungs are well inflated. The interstitial markings remain diffusely increased, and there is increased thickening or trace fluid along the fissures. There are also mildly increased hazy airspace opacities in the right greater than left perihilar regions. No layering pleural effusion or pneumothorax is identified. No acute osseous abnormality is seen. IMPRESSION: Cardiomegaly with increased interstitial and hazy airspace opacities as above likely reflecting mild edema although superimposed infection is not excluded. Electronically Signed   By: Seymour Bars.D.  On: 09/28/2020 20:51   CT Head Wo Contrast  Result Date: 10/03/2020 CLINICAL DATA:  Altered mental status. EXAM: CT HEAD WITHOUT CONTRAST TECHNIQUE: Contiguous axial images were obtained from the base of the skull through the vertex without intravenous contrast. COMPARISON:  June 02, 2016 FINDINGS: Brain: No evidence of acute infarction, hemorrhage, hydrocephalus, extra-axial collection or mass lesion/mass effect. Vascular: No hyperdense vessel or unexpected calcification. Skull: Normal. Negative for fracture or focal lesion. Sinuses/Orbits: There is marked severity bilateral ethmoid sinus and sphenoid sinus mucosal thickening. Other: None. IMPRESSION: 1. No acute intracranial abnormality. 2. Marked severity bilateral ethmoid sinus and sphenoid sinus disease. Electronically Signed   By: Virgina Norfolk M.D.   On: 10/03/2020 23:25   CT ABDOMEN PELVIS W CONTRAST  Result Date: 10/04/2020 CLINICAL DATA:  Abdominal distension and shortness of breath. Sepsis and cocaine use. EXAM: CT ABDOMEN AND PELVIS WITH CONTRAST TECHNIQUE: Multidetector CT imaging of the abdomen and pelvis was performed using the  standard protocol following bolus administration of intravenous contrast. CONTRAST:  148mL OMNIPAQUE IOHEXOL 300 MG/ML  SOLN COMPARISON:  Abdominal radiographs 06/02/2016 FINDINGS: Lower chest: Motion artifact limits examination but there appear to be patchy infiltrates in the lung bases. Possibly pneumonia or edema. Cardiac enlargement. Hepatobiliary: Mild diffuse fatty infiltration of the liver. The gallbladder is decompressed. No bile duct dilatation. Pancreas: Unremarkable. No pancreatic ductal dilatation or surrounding inflammatory changes. Spleen: Normal in size without focal abnormality. Adrenals/Urinary Tract: Adrenal glands are unremarkable. Kidneys are normal, without renal calculi, focal lesion, or hydronephrosis. Bladder is unremarkable. Stomach/Bowel: Stomach is within normal limits. Appendix appears normal. No evidence of bowel wall thickening, distention, or inflammatory changes. Vascular/Lymphatic: Aortic atherosclerosis. No enlarged abdominal or pelvic lymph nodes. Reproductive: Prostate is unremarkable. Other: No abdominal wall hernia or abnormality. No abdominopelvic ascites. Musculoskeletal: No acute or significant osseous findings. IMPRESSION: 1. Patchy infiltrates in the lung bases may indicate pneumonia or edema. 2. Mild diffuse fatty infiltration of the liver. 3. No evidence of bowel obstruction or inflammation. 4. Aortic atherosclerosis. Aortic Atherosclerosis (ICD10-I70.0). Electronically Signed   By: Lucienne Capers M.D.   On: 10/04/2020 06:17   DG Chest Port 1 View  Result Date: 10/03/2020 CLINICAL DATA:  49 year old male with altered mental status, hyperglycemia. EXAM: PORTABLE CHEST 1 VIEW COMPARISON:  Chest radiographs 09/28/2020 and earlier. FINDINGS: Portable AP upright view at 2247 hours. Pacer or resuscitation pads project over the central chest. Stable cardiomegaly and mediastinal contours. Stable lung volumes. Resolved bilateral Patchy and confluent pulmonary opacity seen  on 07/15/2020. Residual increased somewhat coarse interstitial markings in both lungs appear chronic, although probably not significantly changed since 2017. No superimposed pneumothorax, pulmonary edema, pleural effusion or confluent pulmonary opacity. No acute osseous abnormality identified. Negative visible bowel gas pattern. IMPRESSION: Stable.  No acute cardiopulmonary abnormality. Electronically Signed   By: Genevie Ann M.D.   On: 10/03/2020 22:59    Microbiology: Recent Results (from the past 240 hour(s))  Respiratory Panel by RT PCR (Flu A&B, Covid) - Nasopharyngeal Swab     Status: None   Collection Time: 09/29/20 12:12 AM   Specimen: Nasopharyngeal Swab  Result Value Ref Range Status   SARS Coronavirus 2 by RT PCR NEGATIVE NEGATIVE Final    Comment: (NOTE) SARS-CoV-2 target nucleic acids are NOT DETECTED.  The SARS-CoV-2 RNA is generally detectable in upper respiratoy specimens during the acute phase of infection. The lowest concentration of SARS-CoV-2 viral copies this assay can detect is 131 copies/mL. A negative result does not preclude  SARS-Cov-2 infection and should not be used as the sole basis for treatment or other patient management decisions. A negative result may occur with  improper specimen collection/handling, submission of specimen other than nasopharyngeal swab, presence of viral mutation(s) within the areas targeted by this assay, and inadequate number of viral copies (<131 copies/mL). A negative result must be combined with clinical observations, patient history, and epidemiological information. The expected result is Negative.  Fact Sheet for Patients:  PinkCheek.be  Fact Sheet for Healthcare Providers:  GravelBags.it  This test is no t yet approved or cleared by the [place] FDA and  has been authorized for detection and/or diagnosis of SARS-CoV-2 by FDA under an Emergency Use Authorization  (EUA). This EUA will remain  in effect (meaning this test can be used) for the duration of the COVID-19 declaration under Section 564(b)(1) of the Act, 21 U.S.C. section 360bbb-3(b)(1), unless the authorization is terminated or revoked sooner.     Influenza A by PCR NEGATIVE NEGATIVE Final   Influenza B by PCR NEGATIVE NEGATIVE Final    Comment: (NOTE) The Xpert Xpress SARS-CoV-2/FLU/RSV assay is intended as an aid in  the diagnosis of influenza from Nasopharyngeal swab specimens and  should not be used as a sole basis for treatment. Nasal washings and  aspirates are unacceptable for Xpert Xpress SARS-CoV-2/FLU/RSV  testing.  Fact Sheet for Patients: PinkCheek.be  Fact Sheet for Healthcare Providers: GravelBags.it  This test is not yet approved or cleared by the [place] FDA and  has been authorized for detection and/or diagnosis of SARS-CoV-2 by  FDA under an Emergency Use Authorization (EUA). This EUA will remain  in effect (meaning this test can be used) for the duration of the  Covid-19 declaration under Section 564(b)(1) of the Act, 21  U.S.C. section 360bbb-3(b)(1), unless the authorization is  terminated or revoked. Performed at [facility], [address] [place], [place] [address]   Respiratory Panel by RT PCR (Flu A&B, Covid) - Nasopharyngeal Swab     Status: None   Collection Time: 10/03/20 10:37 PM   Specimen: Nasopharyngeal Swab  Result Value Ref Range Status   SARS Coronavirus 2 by RT PCR NEGATIVE NEGATIVE Final    Comment: (NOTE) SARS-CoV-2 target nucleic acids are NOT DETECTED.  The SARS-CoV-2 RNA is generally detectable in upper respiratoy specimens during the acute phase of infection. The lowest concentration of SARS-CoV-2 viral copies this assay can detect is 131 copies/mL. A negative result does not preclude SARS-Cov-2 infection and should not be used as the sole basis for  treatment or other patient management decisions. A negative result may occur with  improper specimen collection/handling, submission of specimen other than nasopharyngeal swab, presence of viral mutation(s) within the areas targeted by this assay, and inadequate number of viral copies (<131 copies/mL). A negative result must be combined with clinical observations, patient history, and epidemiological information. The expected result is Negative.  Fact Sheet for Patients:  PinkCheek.be  Fact Sheet for Healthcare Providers:  GravelBags.it  This test is no t yet approved or cleared by the [place] FDA and  has been authorized for detection and/or diagnosis of SARS-CoV-2 by FDA under an Emergency Use Authorization (EUA). This EUA will remain  in effect (meaning this test can be used) for the duration of the COVID-19 declaration under Section 564(b)(1) of the Act, 21 U.S.C. section 360bbb-3(b)(1), unless the authorization is terminated or revoked sooner.     Influenza A by PCR NEGATIVE NEGATIVE Final  Influenza B by PCR NEGATIVE NEGATIVE Final    Comment: (NOTE) The Xpert Xpress SARS-CoV-2/FLU/RSV assay is intended as an aid in  the diagnosis of influenza from Nasopharyngeal swab specimens and  should not be used as a sole basis for treatment. Nasal washings and  aspirates are unacceptable for Xpert Xpress SARS-CoV-2/FLU/RSV  testing.  Fact Sheet for Patients: PinkCheek.be  Fact Sheet for Healthcare Providers: GravelBags.it  This test is not yet approved or cleared by the [place] FDA and  has been authorized for detection and/or diagnosis of SARS-CoV-2 by  FDA under an Emergency Use Authorization (EUA). This EUA will remain  in effect (meaning this test can be used) for the duration of the  Covid-19 declaration under Section 564(b)(1) of the Act, 21   U.S.C. section 360bbb-3(b)(1), unless the authorization is  terminated or revoked. Performed at [facility], [address] [place], [place] [address]   Blood culture (routine x 2)     Status: None (Preliminary result)   Collection Time: 10/03/20 10:44 PM   Specimen: BLOOD  Result Value Ref Range Status   Specimen Description BLOOD BLOOD RIGHT FOREARM  Final   Special Requests   Final    BOTTLES DRAWN AEROBIC AND ANAEROBIC Blood Culture results may not be optimal due to an excessive volume of blood received in culture bottles   Culture   Final    NO GROWTH 2 DAYS Performed at [facility], [address] [place], [place] [address]    Report Status PENDING  Incomplete  Blood culture (routine x 2)     Status: None (Preliminary result)   Collection Time: 10/03/20 10:44 PM   Specimen: BLOOD  Result Value Ref Range Status   Specimen Description BLOOD BLOOD LEFT FOREARM  Final   Special Requests   Final    BOTTLES DRAWN AEROBIC AND ANAEROBIC Blood Culture adequate volume   Culture   Final    NO GROWTH 2 DAYS Performed at [facility], [address], [place], [place] [address]    Report Status PENDING  Incomplete  Urine culture     Status: None   Collection Time: 10/03/20 11:48 PM   Specimen: In/Out Cath Urine  Result Value Ref Range Status   Specimen Description   Final    IN/OUT CATH URINE Performed at [facility], [address], [place], [place] [address]    Special Requests   Final    NONE Performed at [facility], [address], [place], [place] [address]    Culture   Final    NO GROWTH Performed at [facility], [place] [address], [place], [place] [address]    Report Status 10/05/2020 FINAL  Final     Labs: CBC: Recent Labs  Lab 09/28/20 2005 10/03/20 2237 10/04/20 0517 10/05/20 0448  WBC 8.0 7.5 8.4 8.0  NEUTROABS  --  5.3  --   --   HGB 8.3* 8.3* 8.3* 8.7*  HCT 29.0* 29.1* 28.7* 29.4*  MCV  74.9* 74.6* 74.5* 73.7*  PLT 306 273 242 287   Basic Metabolic Panel: Recent Labs  Lab 09/28/20 2005 10/03/20 2237 10/04/20 0517 10/05/20 0448  NA 139 134* 136 138  K 4.4 4.4 4.0 4.4  CL 108 102 105 104  CO2 24 24 24 25   GLUCOSE 200* 223* 308* 207*  BUN 16 12 11 11   CREATININE 1.15 1.17 1.06 1.07  CALCIUM 8.3* 7.7* 7.6* 8.2*  MG  --  2.2  --  2.4   Liver Function Tests: Recent Labs  Lab 10/03/20 2237  AST 37  ALT 31  ALKPHOS 105  BILITOT 0.6  PROT 6.3*  ALBUMIN 3.1*   CBG: Recent Labs  Lab 10/04/20 2121 10/05/20 0000 10/05/20 0444 10/05/20 0749 10/05/20 1210  GLUCAP 237* 203* 173* 252* 293*    Time spent: 35 minutes  Signed:  Berle Mull  [facility] 10/05/2020 6:43 PM

## 2020-10-07 ENCOUNTER — Telehealth: Payer: Self-pay | Admitting: Family

## 2020-10-07 ENCOUNTER — Ambulatory Visit: Payer: Medicaid Other | Admitting: Family

## 2020-10-07 NOTE — Telephone Encounter (Signed)
Patient did not show for his [facility] appointment on 10/07/20. Will attempt to reschedule.

## 2020-10-08 LAB — CULTURE, BLOOD (ROUTINE X 2)
Culture: NO GROWTH
Culture: NO GROWTH
Special Requests: ADEQUATE

## 2021-02-12 ENCOUNTER — Emergency Department: Payer: Medicaid Other

## 2021-02-12 ENCOUNTER — Other Ambulatory Visit: Payer: Self-pay

## 2021-02-12 ENCOUNTER — Inpatient Hospital Stay
Admission: EM | Admit: 2021-02-12 | Discharge: 2021-02-19 | DRG: 637 | Disposition: A | Discharge status: Home / Self Care | Payer: Medicaid Other | Attending: Internal Medicine | Admitting: Internal Medicine

## 2021-02-12 DIAGNOSIS — I252 Old myocardial infarction: Secondary | ICD-10-CM

## 2021-02-12 DIAGNOSIS — Z7984 Long term (current) use of oral hypoglycemic drugs: Secondary | ICD-10-CM | POA: Diagnosis not present

## 2021-02-12 DIAGNOSIS — Z5900 Homelessness unspecified: Secondary | ICD-10-CM | POA: Diagnosis not present

## 2021-02-12 DIAGNOSIS — E11628 Type 2 diabetes mellitus with other skin complications: Secondary | ICD-10-CM | POA: Diagnosis not present

## 2021-02-12 DIAGNOSIS — Z79899 Other long term (current) drug therapy: Secondary | ICD-10-CM

## 2021-02-12 DIAGNOSIS — F1410 Cocaine abuse, uncomplicated: Secondary | ICD-10-CM

## 2021-02-12 DIAGNOSIS — I70235 Atherosclerosis of native arteries of right leg with ulceration of other part of foot: Secondary | ICD-10-CM | POA: Diagnosis present

## 2021-02-12 DIAGNOSIS — I429 Cardiomyopathy, unspecified: Secondary | ICD-10-CM | POA: Diagnosis present

## 2021-02-12 DIAGNOSIS — G9341 Metabolic encephalopathy: Secondary | ICD-10-CM | POA: Diagnosis present

## 2021-02-12 DIAGNOSIS — L089 Local infection of the skin and subcutaneous tissue, unspecified: Secondary | ICD-10-CM | POA: Diagnosis not present

## 2021-02-12 DIAGNOSIS — I5042 Chronic combined systolic (congestive) and diastolic (congestive) heart failure: Secondary | ICD-10-CM | POA: Diagnosis present

## 2021-02-12 DIAGNOSIS — E1142 Type 2 diabetes mellitus with diabetic polyneuropathy: Secondary | ICD-10-CM | POA: Diagnosis present

## 2021-02-12 DIAGNOSIS — E440 Moderate protein-calorie malnutrition: Secondary | ICD-10-CM | POA: Diagnosis present

## 2021-02-12 DIAGNOSIS — E872 Acidosis, unspecified: Secondary | ICD-10-CM | POA: Diagnosis present

## 2021-02-12 DIAGNOSIS — E1169 Type 2 diabetes mellitus with other specified complication: Principal | ICD-10-CM | POA: Diagnosis present

## 2021-02-12 DIAGNOSIS — L97519 Non-pressure chronic ulcer of other part of right foot with unspecified severity: Secondary | ICD-10-CM | POA: Diagnosis present

## 2021-02-12 DIAGNOSIS — L03115 Cellulitis of right lower limb: Secondary | ICD-10-CM | POA: Diagnosis present

## 2021-02-12 DIAGNOSIS — I70202 Unspecified atherosclerosis of native arteries of extremities, left leg: Secondary | ICD-10-CM | POA: Diagnosis present

## 2021-02-12 DIAGNOSIS — R079 Chest pain, unspecified: Secondary | ICD-10-CM | POA: Diagnosis present

## 2021-02-12 DIAGNOSIS — E11621 Type 2 diabetes mellitus with foot ulcer: Secondary | ICD-10-CM | POA: Diagnosis present

## 2021-02-12 DIAGNOSIS — Z20822 Contact with and (suspected) exposure to covid-19: Secondary | ICD-10-CM | POA: Diagnosis present

## 2021-02-12 DIAGNOSIS — E119 Type 2 diabetes mellitus without complications: Secondary | ICD-10-CM

## 2021-02-12 DIAGNOSIS — M869 Osteomyelitis, unspecified: Secondary | ICD-10-CM

## 2021-02-12 DIAGNOSIS — J309 Allergic rhinitis, unspecified: Secondary | ICD-10-CM | POA: Diagnosis present

## 2021-02-12 DIAGNOSIS — I110 Hypertensive heart disease with heart failure: Secondary | ICD-10-CM | POA: Diagnosis present

## 2021-02-12 DIAGNOSIS — F17200 Nicotine dependence, unspecified, uncomplicated: Secondary | ICD-10-CM | POA: Diagnosis present

## 2021-02-12 DIAGNOSIS — L97509 Non-pressure chronic ulcer of other part of unspecified foot with unspecified severity: Secondary | ICD-10-CM | POA: Diagnosis not present

## 2021-02-12 DIAGNOSIS — M868X7 Other osteomyelitis, ankle and foot: Secondary | ICD-10-CM | POA: Diagnosis present

## 2021-02-12 DIAGNOSIS — E1165 Type 2 diabetes mellitus with hyperglycemia: Secondary | ICD-10-CM | POA: Diagnosis present

## 2021-02-12 DIAGNOSIS — F17210 Nicotine dependence, cigarettes, uncomplicated: Secondary | ICD-10-CM | POA: Diagnosis present

## 2021-02-12 DIAGNOSIS — R400 Somnolence: Secondary | ICD-10-CM

## 2021-02-12 DIAGNOSIS — L03031 Cellulitis of right toe: Secondary | ICD-10-CM | POA: Diagnosis not present

## 2021-02-12 DIAGNOSIS — Z6825 Body mass index (BMI) 25.0-25.9, adult: Secondary | ICD-10-CM

## 2021-02-12 DIAGNOSIS — I2510 Atherosclerotic heart disease of native coronary artery without angina pectoris: Secondary | ICD-10-CM | POA: Diagnosis present

## 2021-02-12 DIAGNOSIS — Z880 Allergy status to penicillin: Secondary | ICD-10-CM | POA: Diagnosis not present

## 2021-02-12 DIAGNOSIS — E1151 Type 2 diabetes mellitus with diabetic peripheral angiopathy without gangrene: Secondary | ICD-10-CM | POA: Diagnosis present

## 2021-02-12 DIAGNOSIS — S90424A Blister (nonthermal), right lesser toe(s), initial encounter: Secondary | ICD-10-CM

## 2021-02-12 DIAGNOSIS — Z794 Long term (current) use of insulin: Secondary | ICD-10-CM | POA: Diagnosis not present

## 2021-02-12 DIAGNOSIS — M79671 Pain in right foot: Secondary | ICD-10-CM

## 2021-02-12 DIAGNOSIS — Z955 Presence of coronary angioplasty implant and graft: Secondary | ICD-10-CM

## 2021-02-12 DIAGNOSIS — I5022 Chronic systolic (congestive) heart failure: Secondary | ICD-10-CM | POA: Diagnosis present

## 2021-02-12 LAB — RESP PANEL BY RT-PCR (FLU A&B, COVID) ARPGX2
Influenza A by PCR: NEGATIVE
Influenza B by PCR: NEGATIVE
SARS Coronavirus 2 by RT PCR: NEGATIVE

## 2021-02-12 LAB — CBC WITH DIFFERENTIAL/PLATELET
Abs Immature Granulocytes: 0.02 10*3/uL (ref 0.00–0.07)
Basophils Absolute: 0.1 10*3/uL (ref 0.0–0.1)
Basophils Relative: 1 %
Eosinophils Absolute: 0.4 10*3/uL (ref 0.0–0.5)
Eosinophils Relative: 6 %
HCT: 40.5 % (ref 39.0–52.0)
Hemoglobin: 12.5 g/dL — ABNORMAL LOW (ref 13.0–17.0)
Immature Granulocytes: 0 %
Lymphocytes Relative: 23 %
Lymphs Abs: 1.6 10*3/uL (ref 0.7–4.0)
MCH: 26.3 pg (ref 26.0–34.0)
MCHC: 30.9 g/dL (ref 30.0–36.0)
MCV: 85.1 fL (ref 80.0–100.0)
Monocytes Absolute: 0.4 10*3/uL (ref 0.1–1.0)
Monocytes Relative: 6 %
Neutro Abs: 4.4 10*3/uL (ref 1.7–7.7)
Neutrophils Relative %: 64 %
Platelets: 325 10*3/uL (ref 150–400)
RBC: 4.76 MIL/uL (ref 4.22–5.81)
RDW: 16.4 % — ABNORMAL HIGH (ref 11.5–15.5)
WBC: 7 10*3/uL (ref 4.0–10.5)
nRBC: 0 % (ref 0.0–0.2)

## 2021-02-12 LAB — COMPREHENSIVE METABOLIC PANEL
ALT: 9 U/L (ref 0–44)
AST: 16 U/L (ref 15–41)
Albumin: 3.4 g/dL — ABNORMAL LOW (ref 3.5–5.0)
Alkaline Phosphatase: 87 U/L (ref 38–126)
Anion gap: 8 (ref 5–15)
BUN: 11 mg/dL (ref 6–20)
CO2: 25 mmol/L (ref 22–32)
Calcium: 8.6 mg/dL — ABNORMAL LOW (ref 8.9–10.3)
Chloride: 101 mmol/L (ref 98–111)
Creatinine, Ser: 1.09 mg/dL (ref 0.61–1.24)
GFR, Estimated: >60 mL/min (ref >60)
Glucose, Bld: 496 mg/dL — ABNORMAL HIGH (ref 70–99)
Potassium: 4.5 mmol/L (ref 3.5–5.1)
Sodium: 134 mmol/L — ABNORMAL LOW (ref 135–145)
Total Bilirubin: 0.4 mg/dL (ref 0.3–1.2)
Total Protein: 6.9 g/dL (ref 6.5–8.1)

## 2021-02-12 LAB — BLOOD GAS, VENOUS
Acid-Base Excess: 4.7 mmol/L — ABNORMAL HIGH (ref 0.0–2.0)
Bicarbonate: 30.4 mmol/L — ABNORMAL HIGH (ref 20.0–28.0)
O2 Saturation: 92.7 %
Patient temperature: 37
pCO2, Ven: 49 mmHg (ref 44.0–60.0)
pH, Ven: 7.4 (ref 7.250–7.430)
pO2, Ven: 66 mmHg — ABNORMAL HIGH (ref 32.0–45.0)

## 2021-02-12 LAB — ETHANOL: Alcohol, Ethyl (B): <10 mg/dL (ref <10)

## 2021-02-12 LAB — URINE DRUG SCREEN, QUALITATIVE (ARMC ONLY)
Amphetamines, Ur Screen: NOT DETECTED
Barbiturates, Ur Screen: NOT DETECTED
Benzodiazepine, Ur Scrn: NOT DETECTED
Cannabinoid 50 Ng, Ur ~~LOC~~: NOT DETECTED
Cocaine Metabolite,Ur ~~LOC~~: POSITIVE — AB
MDMA (Ecstasy)Ur Screen: NOT DETECTED
Methadone Scn, Ur: NOT DETECTED
Opiate, Ur Screen: NOT DETECTED
Phencyclidine (PCP) Ur S: NOT DETECTED
Tricyclic, Ur Screen: NOT DETECTED

## 2021-02-12 LAB — GLUCOSE, CAPILLARY: Glucose-Capillary: 305 mg/dL — ABNORMAL HIGH (ref 70–99)

## 2021-02-12 LAB — LACTIC ACID, PLASMA
Lactic Acid, Venous: 2.3 mmol/L (ref 0.5–1.9)
Lactic Acid, Venous: 1.3 mmol/L (ref 0.5–1.9)

## 2021-02-12 LAB — CBG MONITORING, ED: Glucose-Capillary: 436 mg/dL — ABNORMAL HIGH (ref 70–99)

## 2021-02-12 LAB — MAGNESIUM: Magnesium: 2.1 mg/dL (ref 1.7–2.4)

## 2021-02-12 IMAGING — DX DG FOOT COMPLETE 3+V*R*
3 series · 3 of 3 positions shown · non-contrast
Comparison: No recent prior.

CLINICAL DATA: Right great toe ulcer.

EXAM:
RIGHT FOOT COMPLETE - 3+ VIEW

[foot ap]
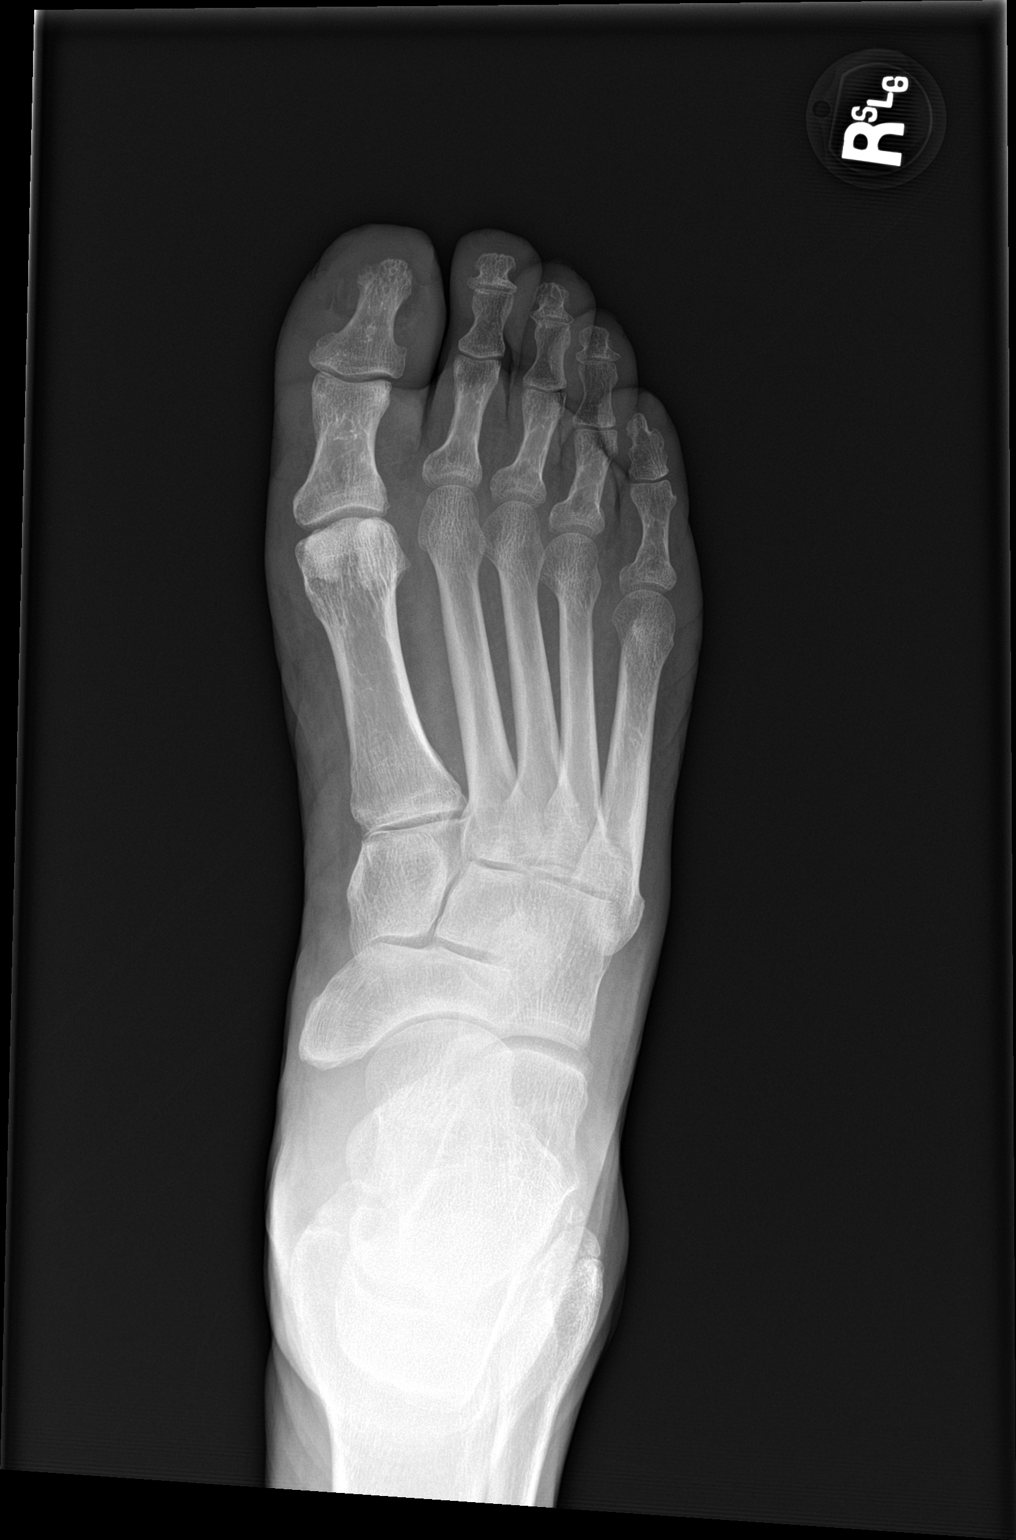

[foot obl]
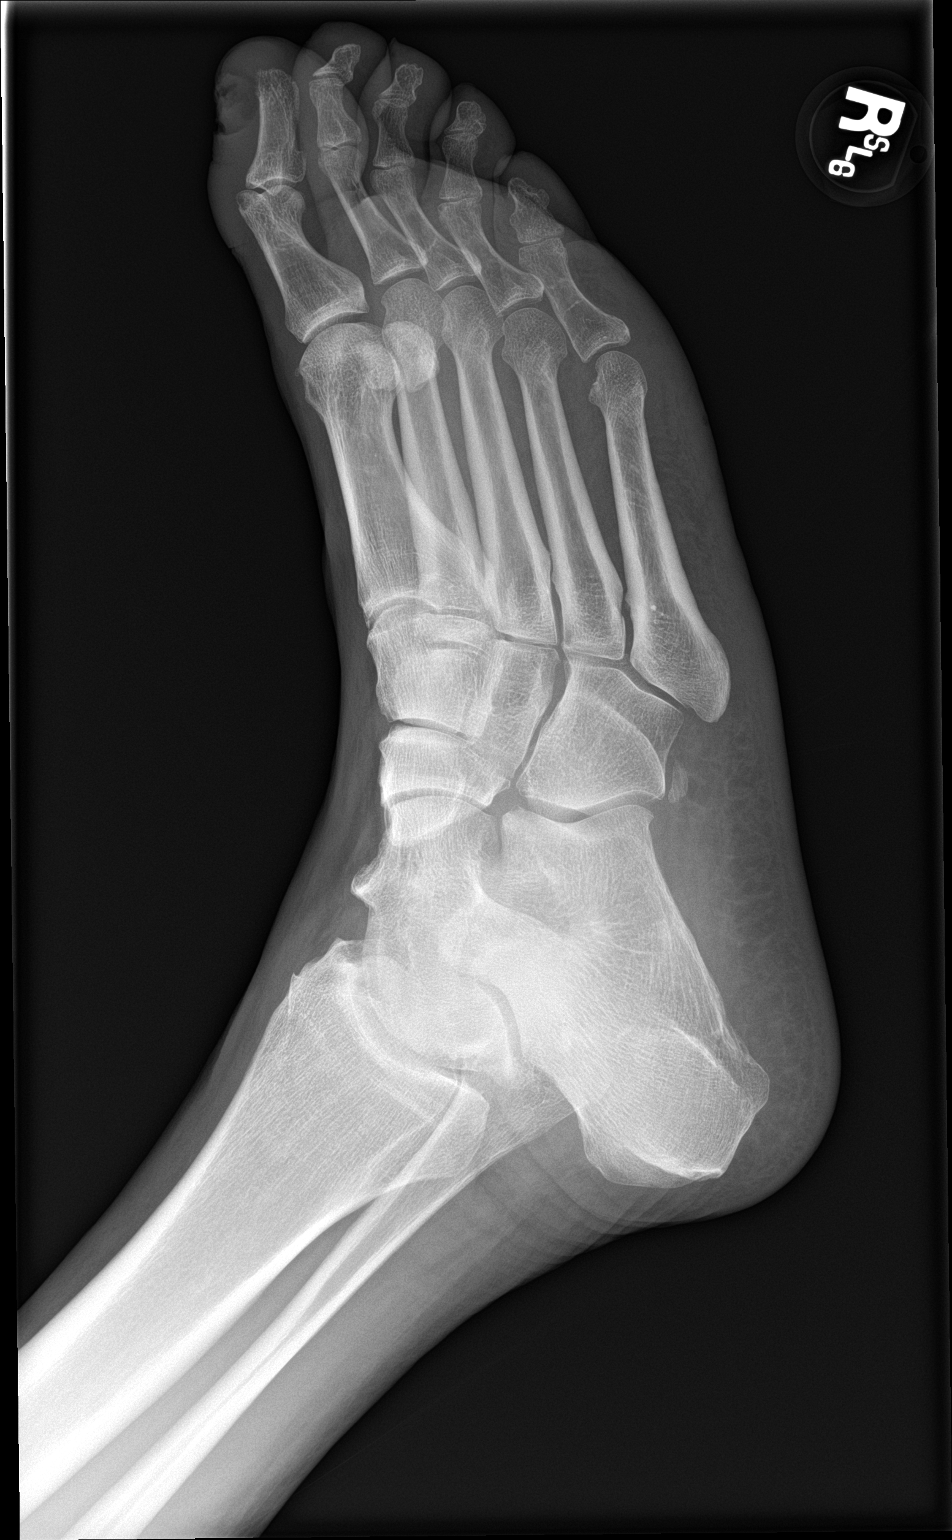

[foot lat]
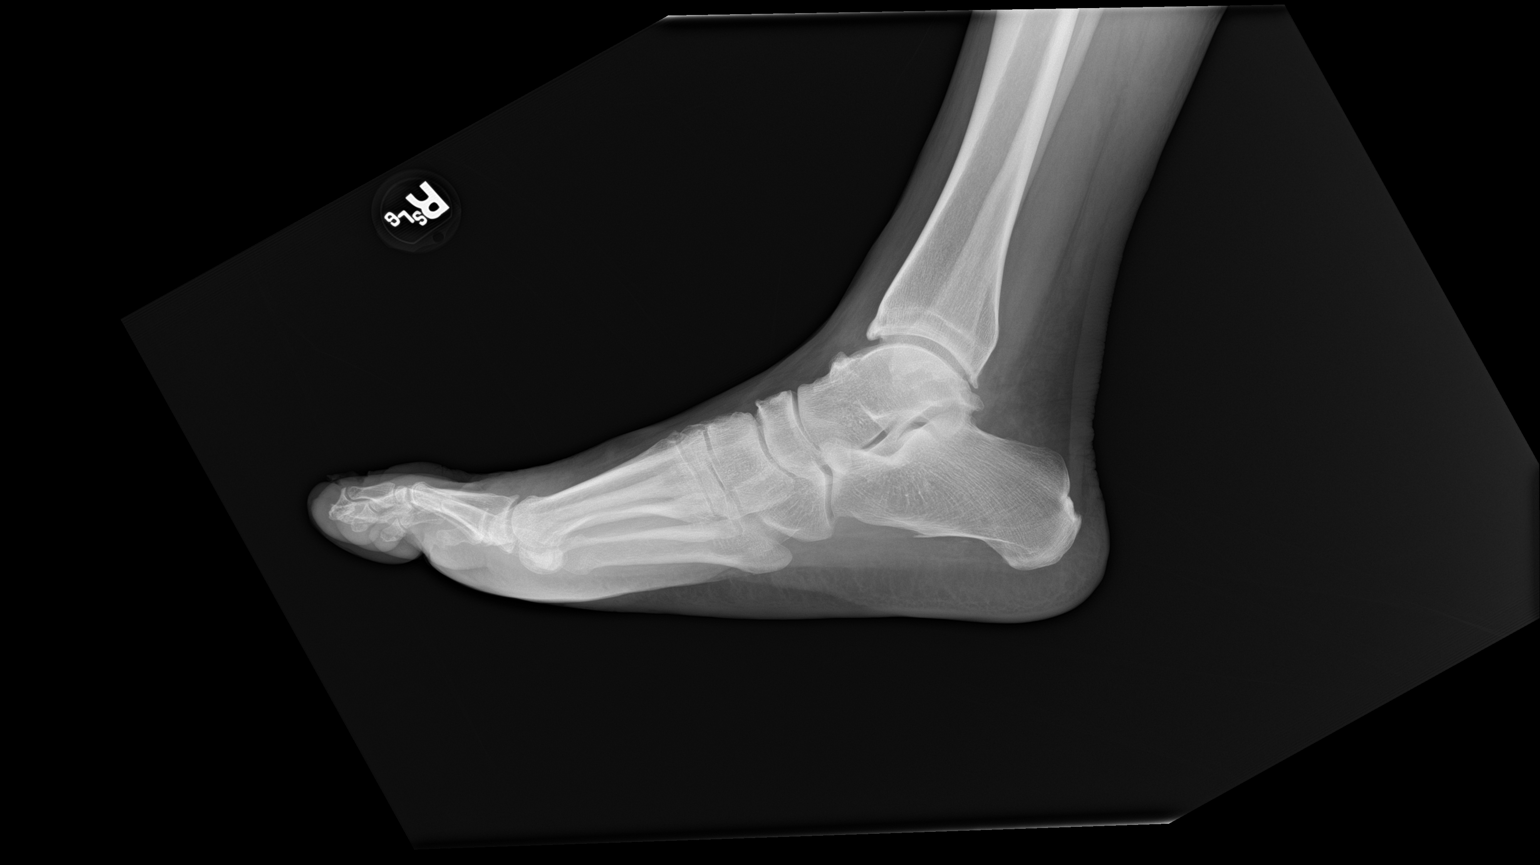

[3 of 3 positions shown; findings below may reference images not displayed]

FINDINGS: Soft tissue swelling right great toe. No radiopaque foreign body.
Subchondral cyst noted about the distal aspect of the proximal
phalanx of the right great toe, this is most likely degenerative. No
cortical erosion. Mild degenerative change first MTP joint. No
evidence of fracture or dislocation. No acute bony abnormality.
IMPRESSION: 1. Soft tissue swelling right great toe. No radiopaque foreign body.
Subchondral cyst noted about the distal aspect of the proximal
phalanx of the right great toe, this is most likely degenerative. No
underlying cortical erosion.
2. Mild degenerative change first MTP joint. No acute bony
abnormality.

## 2021-02-12 IMAGING — CT CT HEAD W/O CM
3 series · 16 of 47 positions shown, 19 images · non-contrast
Comparison: [DATE]

CLINICAL DATA: Mental status changes

EXAM:
CT HEAD WITHOUT CONTRAST
TECHNIQUE: Contiguous axial images were obtained from the base of the skull
through the vertex without intravenous contrast.

[Series 2: head wo · axial · 0.42mm/px · z∈[-155,-30]mm · 10 of 31 slices shown, 13 images]
[im 3/31  brain]
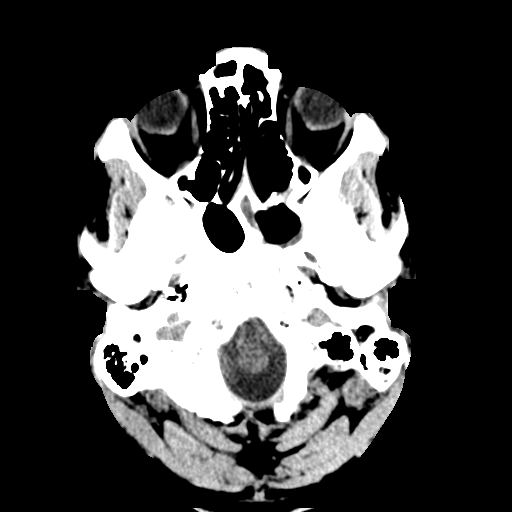
[im 3/31  bone]
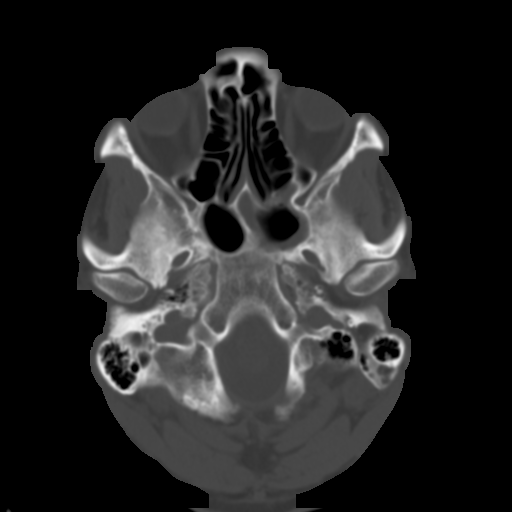
[im 6/31  brain]
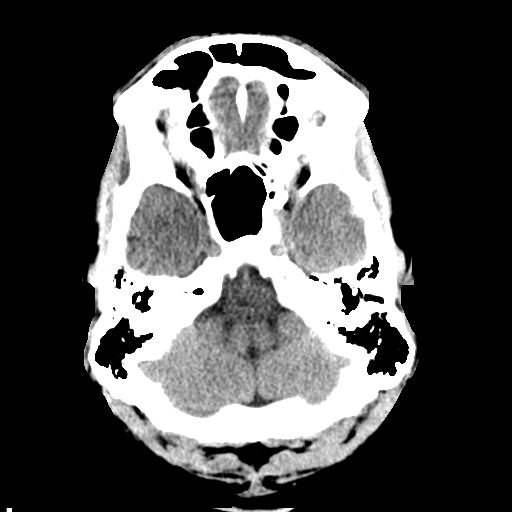
[im 9/31  brain]
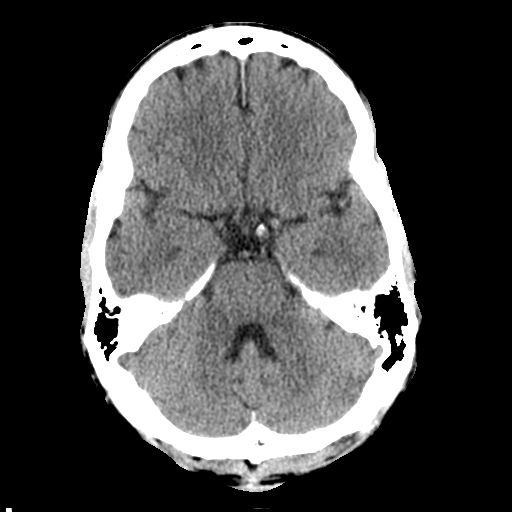
[im 11/31  brain]
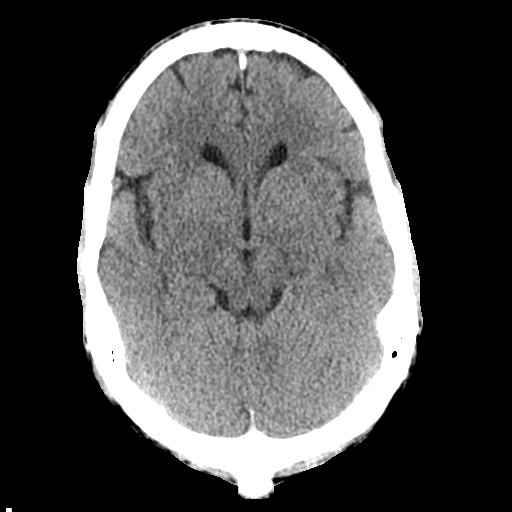
[im 14/31  brain]
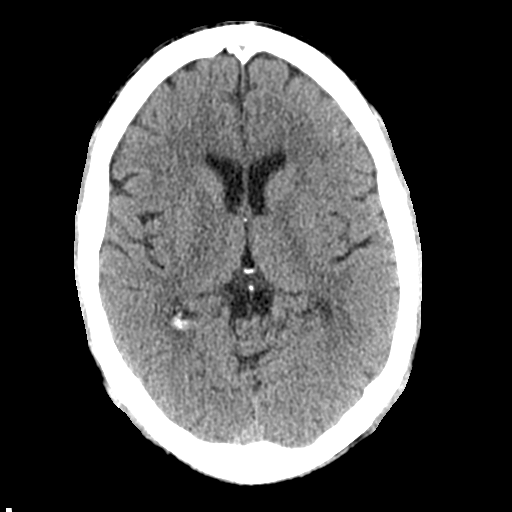
[im 14/31  bone]
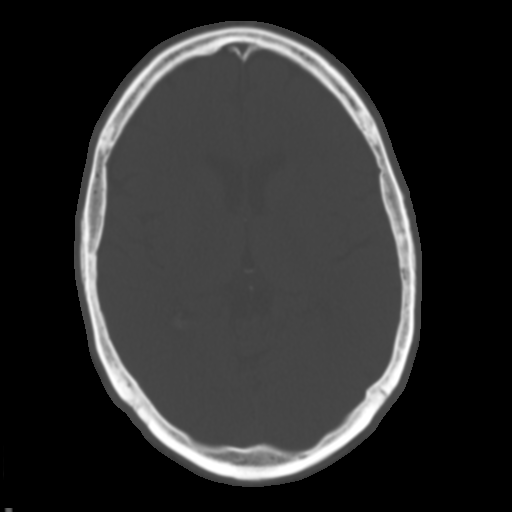
[im 17/31  brain]
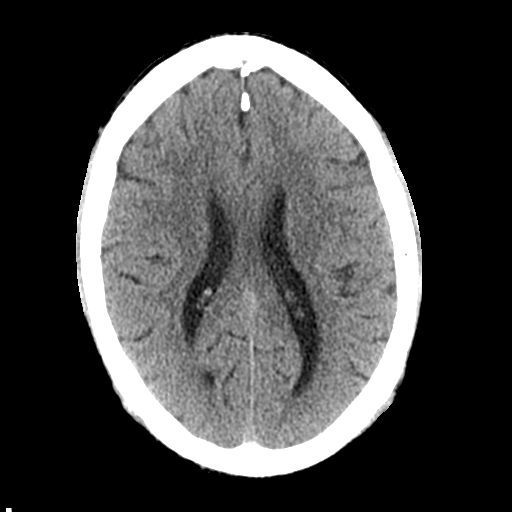
[im 20/31  brain]
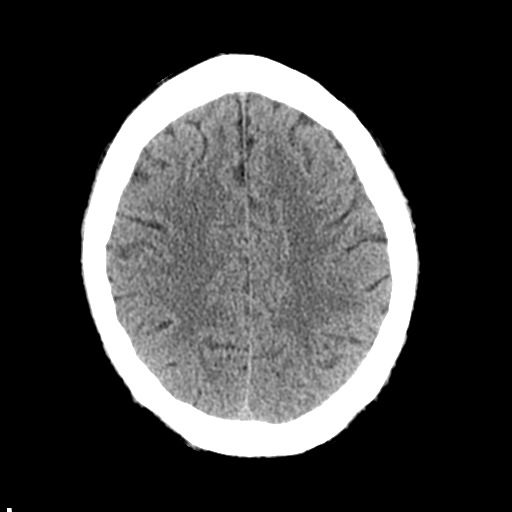
[im 23/31  brain]
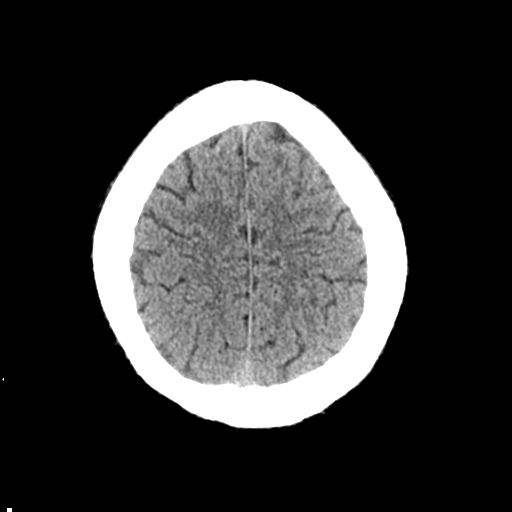
[im 25/31  brain]
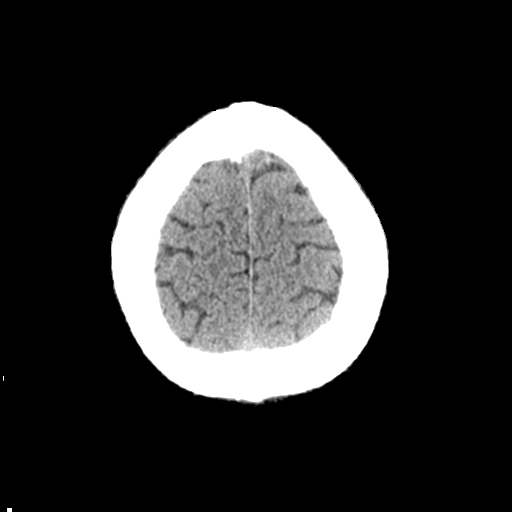
[im 25/31  bone]
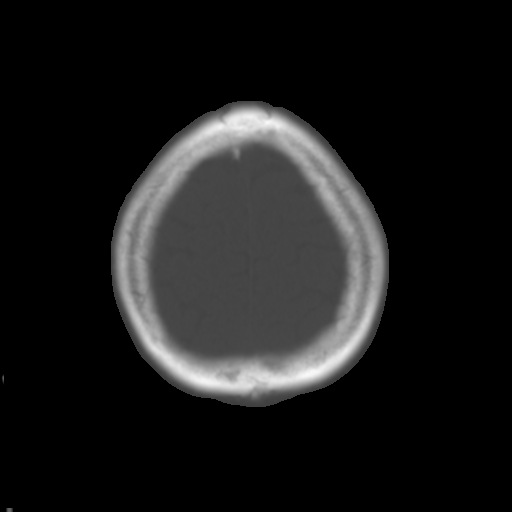
[im 28/31  brain]
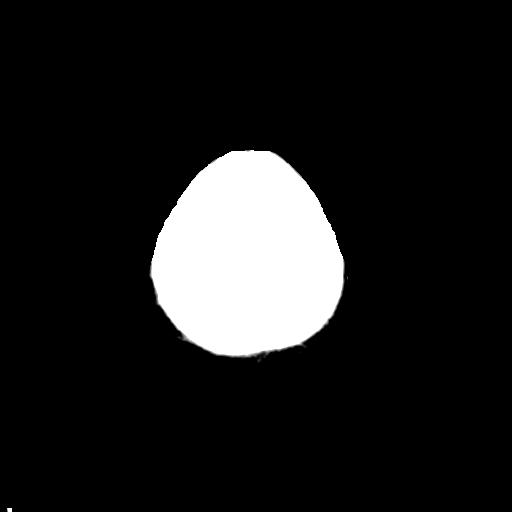

[Series 4: coronal soft tissue · coronal · 0.31mm/px · 3 of 71 slices shown]
[im 24/71  brain]
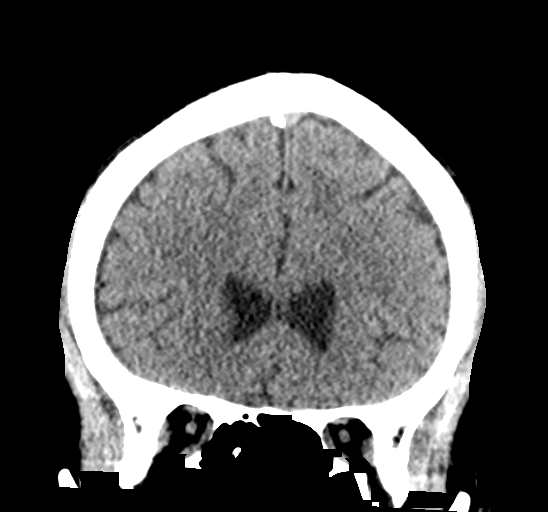
[im 32/71  brain]
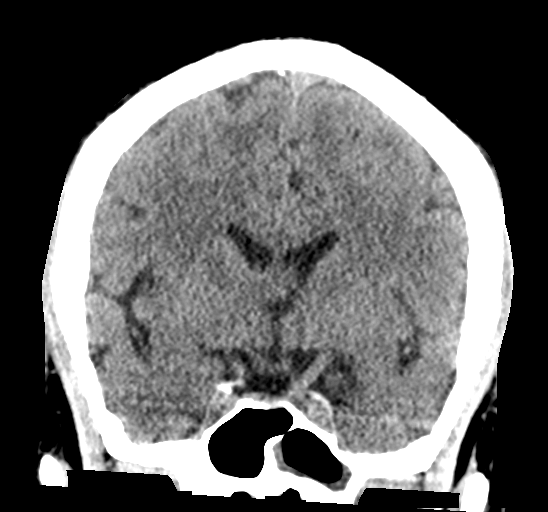
[im 39/71  brain]
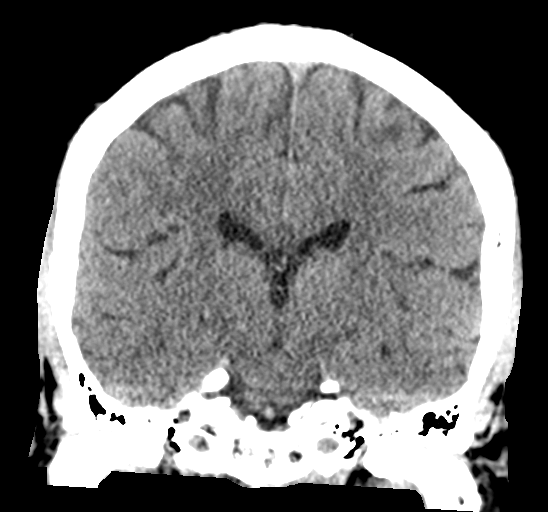

[Series 5: sagittal soft tissue · sagittal · 0.35mm/px · 3 of 56 slices shown]
[im 19/56  brain]
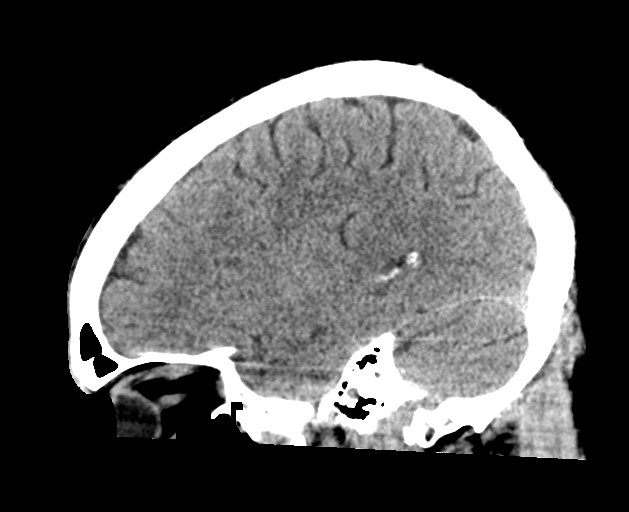
[im 28/56  brain]
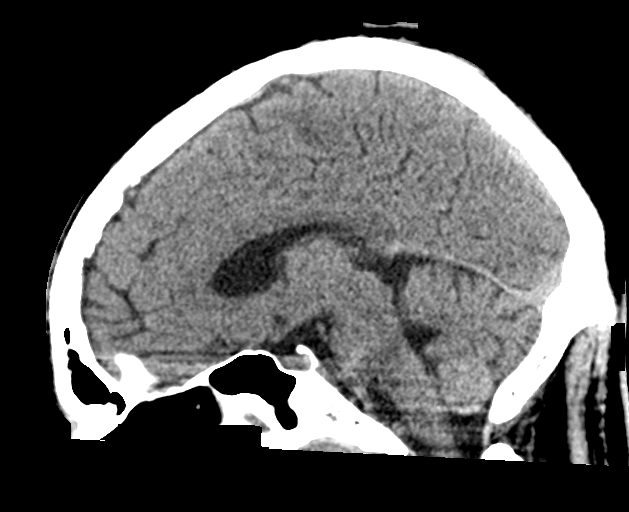
[im 37/56  brain]
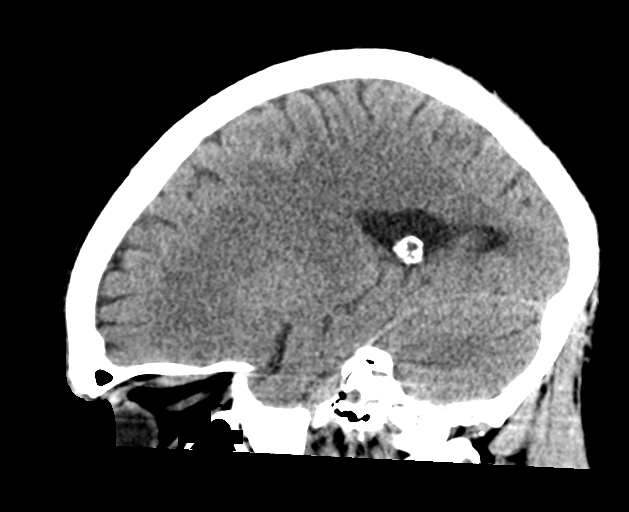

[16 of 47 positions shown; findings below may reference images not displayed]

FINDINGS: Brain: No evidence of acute infarction, hemorrhage, hydrocephalus,
extra-axial collection or mass lesion/mass effect.

Vascular: No hyperdense vessel or unexpected calcification.

Skull: Normal. Negative for fracture or focal lesion.

Sinuses/Orbits: Mild mucosal thickening is noted within the sphenoid
sinus on the left.

Other: None.
IMPRESSION: Mild chronic mucosal thickening in the sphenoid sinus. No
intracranial abnormality is noted.

## 2021-02-12 MED ORDER — INSULIN ASPART 100 UNIT/ML ~~LOC~~ SOLN
0.0000 [IU] | Freq: Every day | SUBCUTANEOUS | Status: DC
  Administered 2021-02-12: 4 [IU] via SUBCUTANEOUS
  Administered 2021-02-13: 3 [IU] via SUBCUTANEOUS
  Administered 2021-02-14 – 2021-02-15 (×2): 4 [IU] via SUBCUTANEOUS
  Administered 2021-02-16: 5 [IU] via SUBCUTANEOUS
  Administered 2021-02-17: 4 [IU] via SUBCUTANEOUS
  Filled 2021-02-12 (×6): qty 1

## 2021-02-12 MED ORDER — CARVEDILOL 3.125 MG PO TABS
3.1250 mg | ORAL_TABLET | Freq: Two times a day (BID) | ORAL | Status: DC
Start: 2021-02-13 — End: 2021-02-19
  Administered 2021-02-13 – 2021-02-19 (×13): 3.125 mg via ORAL
  Filled 2021-02-12 (×13): qty 1

## 2021-02-12 MED ORDER — VANCOMYCIN HCL IN DEXTROSE 1-5 GM/200ML-% IV SOLN: 1000.0000 mg | Freq: Once | INTRAVENOUS | Status: DC

## 2021-02-12 MED ORDER — SODIUM CHLORIDE 0.9 % IV BOLUS
1000.0000 mL | Freq: Once | INTRAVENOUS | Status: AC
  Administered 2021-02-12: 1000 mL via INTRAVENOUS

## 2021-02-12 MED ORDER — INSULIN ASPART 100 UNIT/ML ~~LOC~~ SOLN
0.0000 [IU] | Freq: Three times a day (TID) | SUBCUTANEOUS | Status: DC
  Administered 2021-02-13: 10:00:00 3 [IU] via SUBCUTANEOUS
  Administered 2021-02-13: 8 [IU] via SUBCUTANEOUS
  Administered 2021-02-13 – 2021-02-14 (×2): 11 [IU] via SUBCUTANEOUS
  Administered 2021-02-15: 3 [IU] via SUBCUTANEOUS
  Administered 2021-02-15: 8 [IU] via SUBCUTANEOUS
  Administered 2021-02-15: 4 [IU] via SUBCUTANEOUS
  Administered 2021-02-16: 11 [IU] via SUBCUTANEOUS
  Administered 2021-02-16: 5 [IU] via SUBCUTANEOUS
  Administered 2021-02-16: 3 [IU] via SUBCUTANEOUS
  Administered 2021-02-17 (×2): 8 [IU] via SUBCUTANEOUS
  Administered 2021-02-17: 3 [IU] via SUBCUTANEOUS
  Administered 2021-02-18 (×2): 8 [IU] via SUBCUTANEOUS
  Administered 2021-02-19: 5 [IU] via SUBCUTANEOUS
  Filled 2021-02-12 (×15): qty 1

## 2021-02-12 MED ORDER — METRONIDAZOLE IN NACL 5-0.79 MG/ML-% IV SOLN
500.0000 mg | Freq: Once | INTRAVENOUS | Status: AC
  Administered 2021-02-12: 500 mg via INTRAVENOUS
  Filled 2021-02-12: qty 100

## 2021-02-12 MED ORDER — ACETAMINOPHEN 650 MG RE SUPP: 650.0000 mg | Freq: Four times a day (QID) | RECTAL | Status: DC | PRN

## 2021-02-12 MED ORDER — VANCOMYCIN HCL 1500 MG/300ML IV SOLN
1500.0000 mg | Freq: Once | INTRAVENOUS | Status: AC
  Administered 2021-02-12: 1500 mg via INTRAVENOUS
  Filled 2021-02-12: qty 300

## 2021-02-12 MED ORDER — SODIUM CHLORIDE 0.9 % IV SOLN
2.0000 g | Freq: Once | INTRAVENOUS | Status: AC
  Administered 2021-02-12: 2 g via INTRAVENOUS
  Filled 2021-02-12: qty 2

## 2021-02-12 MED ORDER — FENTANYL CITRATE (PF) 100 MCG/2ML IJ SOLN: 25.0000 ug | INTRAMUSCULAR | Status: DC | PRN

## 2021-02-12 MED ORDER — GABAPENTIN 100 MG PO CAPS
100.0000 mg | ORAL_CAPSULE | Freq: Every day | ORAL | Status: DC
  Administered 2021-02-13 – 2021-02-18 (×7): 100 mg via ORAL
  Filled 2021-02-12 (×7): qty 1

## 2021-02-12 MED ORDER — FLUTICASONE PROPIONATE 50 MCG/ACT NA SUSP
2.0000 | Freq: Every day | NASAL | Status: DC
  Administered 2021-02-13 – 2021-02-19 (×7): 2 via NASAL
  Filled 2021-02-12: qty 16

## 2021-02-12 MED ORDER — ACETAMINOPHEN 325 MG PO TABS
650.0000 mg | ORAL_TABLET | Freq: Four times a day (QID) | ORAL | Status: DC | PRN
  Administered 2021-02-15: 650 mg via ORAL
  Filled 2021-02-12: qty 2

## 2021-02-12 MED ORDER — LOSARTAN POTASSIUM 25 MG PO TABS
25.0000 mg | ORAL_TABLET | Freq: Every day | ORAL | Status: DC
  Administered 2021-02-13 – 2021-02-19 (×7): 25 mg via ORAL
  Filled 2021-02-12 (×7): qty 1

## 2021-02-12 NOTE — H&P (Signed)
History and Physical    PLEASE NOTE THAT DRAGON DICTATION SOFTWARE WAS USED IN THE CONSTRUCTION OF THIS NOTE.   Joshua [name] SVX:[id number] DOB: [birth year] DOA: 02/12/2021  PCP: Patient, No Pcp Per Patient coming from: home   I have personally briefly reviewed patient's old medical records in [place]  Chief Complaint: right foot pain   HPI: Joshua [name] is a 50 y.o. male with medical history significant for chronic combined heart failure with most recent echocardiogram in August 2021 showing LVEF 25 to 30% and grade 3 diastolic dysfunction, hypertension, type 2 diabetes mellitus complicated by peripheral polyneuropathy, who is admitted to [facility] on 02/12/2021 with suspected cellulitis due to infected diabetic right foot ulcer after presenting to [facility] ED complaining of right foot pain.   The patient reports 1 week of progressive pain of the right great toe associated with increased swelling, erythema, increased warmth to touch, as well as drainage, which he believes is more serous in nature.  He reports that these findings started within 3 to 4 days of his attempts to manually self debride some dry screen over the right great toe.  Otherwise, denies any recent trauma/injury to the right lower extremity.  Denies any extension of the erythema or swelling distal to the right great toe, and denies rash in any other location, including no new erythema/swelling/tenderness involving the left lower extremity.  Denies any associated subjective fever, chills, rigors, or generalized myalgias.  He knowledges a history of type 2 diabetes, complicated by diabetic polyperipheral neuropathy for which he is on gabapentin at home.  Within the remark of report of chronic paresthesias involving the bilateral lower extremities, the patient denies any recent exacerbation of these paresthesias, including no development of acute numbness of the lower extremities. he  reports that he is homeless and has been unable recently to rule out labor curious home oral hypoglycemic regimen, which consists of Metformin as well as dapagliflozin.  Denies any use of insulin as an outpatient.  Per chart review, most recent hemoglobin A1c noted to be 9.0% in October 2021.   Denies any recent or associated shortness of breath, orthopnea, PND.  Also denies any recent chest pain, paresthesias, diaphoresis, n/v.  Denies any recent dysuria, gross hematuria, or change in urinary urgency/frequency.  No recent headache, neck stiffness, sore throat, cough, abdominal pain, diarrhea.  No recent traveling or known COVID-19 exposures.  Medical history is notable for chronic combined heart failure, with most recent echocardiogram performed in August 2021 showing LVEF 25 to 30%, mildly dilated left ventricle, and evidence of grade 3 diastolic dysfunction.  He also reports suboptimal compliance with his home heart failure regimen, including that of Coreg, losartan, and spironolactone in the setting of difficulty with consistently being able to get these medications at the pharmacy.  Additionally, he reports that he is prescribed as needed Lasix to be taken for greater than 3 pound weight gain over a 24-hour period as well as greater than 5 pound weight gain over a 48-hour.  Reports that he has not taken any as needed Lasix for several weeks.   Of note, upon initially presenting to [facility] ED today, the patient was found to be somnolent and difficult to rouse to verbal stimuli.  This reportedly improved following initiation of IV antibiotics, as further described below.  The patient denies any acute focal weakness, acute focal numbness/paresthesias, dysarthria, facial droop, acute change in vision, dysphagia, or vertigo. He denies any recent recreational drug  use, including no recent cocaine use.  He reports that he consumes less than one 12 ounce beer on a daily basis, with most recent consumption occurring  yesterday.      ED Course:  Vital signs in the ED were notable for the following: Tetramex 98.4, heart rate 82-98; blood pressure 119/81 - 135/89; respiratory rate 16-18, oxygen saturation 98 100% on room air.  Labs were notable for the following: CMP was notable for the following: Sodium 134, bicarbonate 25, anion gap 8, creatinine 1.09.  CBC notable for white blood cell count of 7000.  Lactic acid 2.3.  Point-of-care glucose noted to be 436.  Urinary drug screen was found to be positive for cocaine, but otherwise been negative.  Serum ethanol level noted to be less than 10.  Screening nasopharyngeal COVID-19 PCR performed in the emergency department today was found to be negative.  Blood cultures x2 were collected in the ED.   Noncontrast CT of the head to evaluate initial somnolence showed no evidence of acute intracranial process, clear no evidence of intracranial hemorrhage or acute ischemic infarct.  Plain films of the right foot showed soft tissue swelling of the right great toe without evidence of underlying cortical erosion, radiopaque foreign body, or acute fracture.  While in the ED, the following were administered: Dose of IV vancomycin, IV Flagyl, and IV cefepime as well as a 1 L normal saline bolus.      Review of Systems: As per HPI otherwise 10 point review of systems negative.   Past Medical History:  Diagnosis Date  . CHF (congestive heart failure) ([place])   . Diabetes mellitus without complication ([place])   . Hypertension     Past Surgical History:  Procedure Laterality Date  . CORONARY/GRAFT ACUTE MI REVASCULARIZATION N/A 09/09/2018   Procedure: Coronary/Graft Acute MI Revascularization;  Surgeon: Yolonda Kida, MD;  Location: [place] CV LAB;  Service: Cardiovascular;  Laterality: N/A;  . LEFT HEART CATH AND CORONARY ANGIOGRAPHY N/A 09/09/2018   Procedure: LEFT HEART CATH AND CORONARY ANGIOGRAPHY;  Surgeon: Yolonda Kida, MD;  Location: [place] CV  LAB;  Service: Cardiovascular;  Laterality: N/A;  . none    . STENT PLACEMENT VASCULAR ([place] HX)      Social History:  reports that he has been smoking. He has never used smokeless tobacco. He reports current alcohol use. He reports previous drug use.   Allergies  Allergen Reactions  . Penicillins Other (See Comments)    Patient unsure of allergy    History reviewed. No pertinent family history.   Prior to Admission medications   Medication Sig Start Date End Date Taking? Authorizing Provider  carvedilol (COREG) 3.125 MG tablet Take 1 tablet (3.125 mg total) by mouth 2 (two) times daily with a meal. 10/05/20   Lavina Hamman, MD  dapagliflozin propanediol (FARXIGA) 5 MG TABS tablet Take 1 tablet (5 mg total) by mouth daily. 10/05/20   Lavina Hamman, MD  fluticasone Asencion Islam) 50 MCG/ACT nasal spray Place 2 sprays into both nostrils daily. 10/05/20   Lavina Hamman, MD  furosemide (LASIX) 20 MG tablet Take 1 tablet (20 mg total) by mouth daily as needed (for weight gain >3lbs in 1 days and >5lbs in 2 days). 10/05/20 10/05/21  Lavina Hamman, MD  gabapentin (NEURONTIN) 100 MG capsule Take 1 capsule (100 mg total) by mouth at bedtime. 10/05/20   Lavina Hamman, MD  losartan (COZAAR) 25 MG tablet Take 1 tablet (25 mg  total) by mouth daily. 10/05/20 10/05/21  Lavina Hamman, MD  metFORMIN (GLUCOPHAGE) 500 MG tablet Take 1 tablet (500 mg total) by mouth daily with breakfast. 08/02/20 10/03/20  Vladimir Crofts, MD  nitroGLYCERIN (NITROSTAT) 0.4 MG SL tablet Place 1 tablet (0.4 mg total) under the tongue every 5 (five) minutes x 3 doses as needed for chest pain. Patient not taking: Reported on 07/16/2020 09/10/18   Demetrios Loll, MD  spironolactone (ALDACTONE) 25 MG tablet Take 1 tablet (25 mg total) by mouth daily. 10/05/20   Lavina Hamman, MD     Objective    Physical Exam: Vitals:   02/12/21 1327 02/12/21 1641 02/12/21 1839  BP: 119/81 135/86 123/77  Pulse: 96 98 93  Resp: 18 18 16    Temp: 98 F (36.7 C)    SpO2: 100% 98% 99%    General: appears to be stated age; alert, oriented Skin: warm, dry, no rash; ulcerative lesion associated with the distal aspect of the right great toe, with associated erythema, swelling, tenderness, increased warmth of the treatment. Head:  AT/[place] Mouth:  Oral mucosa membranes appear moist, normal dentition Neck: supple; trachea midline Heart:  RRR; did not appreciate any M/R/G Lungs: CTAB, did not appreciate any wheezes, rales, or rhonchi Abdomen: + BS; soft, ND, NT Vascular: 2+ pedal pulses b/l; 2+ radial pulses b/l Extremities: no muscle wasting; erythema, swelling, tenderness, associated with the distal aspect of the right great toe, as further noted above. Neuro: strength intact in upper and lower extremities b/l    Labs on Admission: I have personally reviewed following labs and imaging studies  CBC: Recent Labs  Lab 02/12/21 1457  WBC 7.0  NEUTROABS 4.4  HGB 12.5*  HCT 40.5  MCV 85.1  PLT 023   Basic Metabolic Panel: Recent Labs  Lab 02/12/21 1457  NA 134*  K 4.5  CL 101  CO2 25  GLUCOSE 496*  BUN 11  CREATININE 1.09  CALCIUM 8.6*   GFR: CrCl cannot be calculated (Unknown ideal weight.). Liver Function Tests: Recent Labs  Lab 02/12/21 1457  AST 16  ALT 9  ALKPHOS 87  BILITOT 0.4  PROT 6.9  ALBUMIN 3.4*   No results for input(s): LIPASE, AMYLASE in the last 168 hours. No results for input(s): AMMONIA in the last 168 hours. Coagulation Profile: No results for input(s): INR, PROTIME in the last 168 hours. Cardiac Enzymes: No results for input(s): CKTOTAL, CKMB, CKMBINDEX, TROPONINI in the last 168 hours. BNP (last 3 results) No results for input(s): PROBNP in the last 8760 hours. HbA1C: No results for input(s): HGBA1C in the last 72 hours. CBG: Recent Labs  Lab 02/12/21 1445  GLUCAP 436*   Lipid Profile: No results for input(s): CHOL, HDL, LDLCALC, TRIG, CHOLHDL, LDLDIRECT in the last 72  hours. Thyroid Function Tests: No results for input(s): TSH, T4TOTAL, FREET4, T3FREE, THYROIDAB in the last 72 hours. Anemia Panel: No results for input(s): VITAMINB12, FOLATE, FERRITIN, TIBC, IRON, RETICCTPCT in the last 72 hours. Urine analysis:    Component Value Date/Time   COLORURINE YELLOW (A) 10/03/2020 2348   APPEARANCEUR CLOUDY (A) 10/03/2020 2348   LABSPEC 1.019 10/03/2020 2348   PHURINE 8.0 10/03/2020 2348   GLUCOSEU NEGATIVE 10/03/2020 2348   HGBUR NEGATIVE 10/03/2020 2348   BILIRUBINUR NEGATIVE 10/03/2020 2348   KETONESUR NEGATIVE 10/03/2020 2348   PROTEINUR NEGATIVE 10/03/2020 2348   NITRITE NEGATIVE 10/03/2020 2348   LEUKOCYTESUR NEGATIVE 10/03/2020 2348    Radiological Exams on Admission: CT Head Wo  Contrast  Result Date: 02/12/2021 CLINICAL DATA:  Mental status changes EXAM: CT HEAD WITHOUT CONTRAST TECHNIQUE: Contiguous axial images were obtained from the base of the skull through the vertex without intravenous contrast. COMPARISON:  10/04/2019 FINDINGS: Brain: No evidence of acute infarction, hemorrhage, hydrocephalus, extra-axial collection or mass lesion/mass effect. Vascular: No hyperdense vessel or unexpected calcification. Skull: Normal. Negative for fracture or focal lesion. Sinuses/Orbits: Mild mucosal thickening is noted within the sphenoid sinus on the left. Other: None. IMPRESSION: Mild chronic mucosal thickening in the sphenoid sinus. No intracranial abnormality is noted. Electronically Signed   By: Inez Catalina M.D.   On: 02/12/2021 16:13   DG Foot Complete Right  Result Date: 02/12/2021 CLINICAL DATA:  Right great toe ulcer. EXAM: RIGHT FOOT COMPLETE - 3+ VIEW COMPARISON:  No recent prior. FINDINGS: Soft tissue swelling right great toe. No radiopaque foreign body. Subchondral cyst noted about the distal aspect of the proximal phalanx of the right great toe, this is most likely degenerative. No cortical erosion. Mild degenerative change first MTP joint. No  evidence of fracture or dislocation. No acute bony abnormality. IMPRESSION: 1. Soft tissue swelling right great toe. No radiopaque foreign body. Subchondral cyst noted about the distal aspect of the proximal phalanx of the right great toe, this is most likely degenerative. No underlying cortical erosion. 2. Mild degenerative change first MTP joint. No acute bony abnormality. Electronically Signed   By: Marcello Moores  Register   On: 02/12/2021 15:08      Assessment/Plan   Joshua [name] is a 50 y.o. male with medical history significant for chronic combined heart failure with most recent echocardiogram in August 2021 showing LVEF 25 to 30% and grade 3 diastolic dysfunction, hypertension, type 2 diabetes mellitus complicated by peripheral polyneuropathy, who is admitted to [facility] on 02/12/2021 with suspected cellulitis due to infected diabetic right foot ulcer after presenting to [facility] ED complaining of right foot pain.    Principal Problem:   Cellulitis of great toe of right foot Active Problems:   Type 2 diabetes mellitus without complication (HCC)   Diabetic foot ulcer (HCC)   Lactic acidosis   Acute metabolic encephalopathy   Chronic combined systolic (congestive) and diastolic (congestive) heart failure (HCC)    #) Cellulitis of right great toe due to infected diabetic foot ulcer: Diagnosis on the basis of 1 week of progressive right great toe tenderness associated with erythema, swelling, increased warmth, which reportedly started in the days following patient manual debridement of dry skin associated the right foot, potentially representing disruption of the skin barrier causing a portal of entry leading to this presentation, with associated evidence of ulcer on the distal aspect of the right great toe.  No evidence of crepitus on exam or evidence of subcutaneous gas on plain films of the right foot to suggest necrotizing fasciitis.  Additionally, plain films of the  right foot show no evidence of bone demineralization, but will further evaluate potential underlying osteomyelitis by pursuing MRI of the right foot.  Right lower extremity appears neurovascular intact relative to patient's baseline.  While the patient's report of serous discharge associated with the right toe renders the possibility of MRSA to be less likely, will continue IV vancomycin for now pending evaluation for osteomyelitis, as further noted below.  Additional risk factors for presenting cellulitis/infected diabetic foot ulcer include poorly controlled type 2 diabetes mellitus in the setting of suboptimal compliance with outpatient oral hypoglycemic regimen as well as peripheral vasoconstriction induced by  chronic tobacco abuse.  Of note, SIRS criteria are not met at this time, and therefore criteria for sepsis not currently met.    Plan: For now, continue broad-spectrum IV antibiotics in the form of IV vancomycin as well as cefepime, pending MRI of the right foot. Will await results of MRI foot, including evaluation for underlying osteomyelitis, to help guide direction of potential subsequent consultations, including that of general surgery for debridement of infected diabetic foot ulcer versus podiatry vs orthopedic surgery.  Follow for results blood cultures x2.  Repeat CBC in the morning.  I have placed a nursing communication order requesting that the right lower extremity be elevated out decrease associated swelling.  As needed IV fentanyl.  Check inflammatory markers in the form of CRP and ESR.  Counseled the patient on the need for improvement of compliance with home oral hypoglycemic medications, as further indicated below.      #) Lactic acidosis: Presenting lactate found to be mildly elevated at 2.3, without evidence of elevated anion gap.  As described above, criteria for sepsis are not met at this time.  However, differential includes the possibility of starvation keto-lactic acidosis  given the patient's report of diminished oral intake over the last several days.  Will also check INR to evaluate hepatic synthetic function.  The patient has received a 1 L normal saline bolus in the ED. we will refrain from additional IV fluids for now the setting of chronic combined heart failure, rather repeat lactate now, with plan to reassess intervention with additional IV fluids if ensuing lactic acid level remains elevated.   Plan: Repeat lactic acid now.  Check urinalysis, including retention presence of ketones.  Repeat CMP in the morning, including attention to interval renal function.  Check INR.      #) Acute encephalopathy: Patient reportedly initially somnolent on presentation to [facility] ED today, associated with initial diminished response to verbal stimuli.  This appears to have improved in the interval, with the patient appearing to be at baseline mental status at this time following interval initiation of IV antibiotics, as above, recently possibility of contribution from acute metabolic encephalopathy as result of physiologic stress stemming from presenting cellulitis/infected right great toe ulcer.  There may also be a contribution from recreational drug use given presenting UDS found to be positive for cocaine use, which the patient denies.  No evidence of additional underlying infectious process at this time, although will check urinalysis to further evaluate this possibility.  Of note, nasopharyngeal COVID-19 PCR performed today was found to be negative.  No evidence of acute focal neurologic deficits to suggest an acute stroke, while presenting CT of the head shows no evidence of acute intracranial process.   Plan: Work-up and management of presenting cellulitis/ infected right diabetic foot ulcer, as further described above.  Check urinalysis.  Check TSH.  Repeat CMP in the morning.  Counseled patient on importance of refraining from recreational drug use. I have also placed a  consult to the transitions of care team in the setting of the positive cocaine finding on presenting urinary drug screen.      #) Chronic combined systolic/diastolic heart failure: Most recent echocardiogram August 2021 was notable for mildly delayed highly dilated left ventricular with LVEF 25 to 30% and grade 3 diastolic dysfunction.  Suspected to be consistent with ischemic cardiomyopathy in the context of a history of CAD status post PCI with stent placement x2 in September 2019.  Differential also includes toxic factors stemming from  presenting urinary drug screen was found to be positive for cocaine.  Reports that he consumes less than 1 12 ounce beer per day, rendering the possibility of alcohol induced dilated cardiomyopathy to be less likely.  The patient conveys suboptimal compliance with his home cardiac regimen includes Coreg, losartan, and spironolactone.  He is also on as needed Lasix, with parameters as outlined above.  No clinical evidence of acutely decompensated heart failure at this time.  Plan: Monitor strict I's and O's and daily weights.  Add on serum magnesium level.  Repeat BMP in the morning.  Continue home Coreg and losartan for optimization of afterload reduction in order to optimize cardiac output.  We will hold home spironolactone for now listening presenting infection.  Counseled the patient on the importance of compliance with his outpatient cardiac regimen.  I have placed a consult for the transition of care team given the patient's reported difficulty in procuring several of his outpatient cardiac medications.     #) Type 2 diabetes mellitus: Poorly controlled as an outpatient with most recent hemoglobin A1c noted to be 9 present in October 2021.  This is in the context of patient's acknowledgment of suboptimal compliance with his outpatient oral hypoglycemic regimen that consists of Metformin and dapagliflozin.  Presenting blood sugar noted to be elevated into the low  400s, in the absence of any evidence of anion gap metabolic acidosis.  Patient's diabetes is also complicated by a history of peripheral polyneuropathy for which she is on gabapentin as an outpatient.  Plan: Counseled the patient on the importance of improved compliance with his oral hypoglycemic regimen.  Will recheck hemoglobin A1c at this time.  Accu-Cheks before every meal and at bedtime with moderate dose sliding scale insulin.  Continue home gabapentin.  Hold home oral hypoglycemic agents during this hospitalization.       #) Allergic rhinitis: The patient reports that this is in the context of seasonal allergies, for which he is on daily intranasal Flonase.  Plan: Continue outpatient intranasal Flonase.      #) Chronic tobacco abuse: Patient reports that he is achronic smoker, having smoked between 1/4 pack to 1/2 pack/day over the course of the last 20 years.  This is notable in the context of presenting infected diabetic foot ulcer due to associated increased risk for peripheral vasoconstriction as a result of this tobacco abuse.  Plan: Counseled the patient on the importance of complete smoking discontinuation.       DVT prophylaxis: scd's  Code Status: Full code Family Communication: none Disposition Plan: Per Rounding Team Consults called: none  Admission status: inpatient; med-tele    Of note, this patient was added by me to the following Admit List/Treatment Team: armcadmits.      PLEASE NOTE THAT DRAGON DICTATION SOFTWARE WAS USED IN THE CONSTRUCTION OF THIS NOTE.   [facility] [facility] Pager [phone] From 12PM - 12AM  Otherwise, please contact night-coverage  [email or web address] Password [facility]   02/12/2021, 7:45 PM

## 2021-02-12 NOTE — ED Triage Notes (Signed)
Pt comes with c/o bilateral foot pain. Pt states it has been hurting forever. Pt keeps falling asleep in triage room. Pt laughing at times.   Pt denies any drug use but states some alcohol use.

## 2021-02-12 NOTE — ED Notes (Signed)
This RN to room to find pt standing next to bed. Pt states "I feel much better, I just need to get untangled from all these wires." Pt back to bed, all monitors reconnected. Pt A&Ox4 at this time. Pt c/o pain and sores to bilateral feet. Denies any needs at this time.

## 2021-02-12 NOTE — Consult Note (Signed)
PHARMACY -  BRIEF ANTIBIOTIC NOTE   Pharmacy has received consult(s) for vancomycin & cefepime from an ED provider.  The patient's profile has been reviewed for ht/wt/allergies/indication/available labs.    One time order(s) placed for: Vancomycin 1.5g Cefepime 2g x1  Metronidazole IV 500mg  x1  Further antibiotics/pharmacy consults should be ordered by admitting physician if indicated.                       Thank you, Lorna Dibble 02/12/2021  3:35 PM

## 2021-02-12 NOTE — ED Provider Notes (Signed)
[facility] Emergency Department Provider Note   ____________________________________________   Event Date/Time   First MD Initiated Contact with Patient 02/12/21 1409     (approximate)  I have reviewed the triage vital signs and the nursing notes.   HISTORY  Chief Complaint Foot Pain    HPI Joshua [name] is a 50 y.o. male patient complain of bilateral foot pain right greater than left.  Patient cannot give a timeframe for complaint.  Patient exhibits increase somnolence a good history.  Patient denies drug use.  States alcohol use.  Patient rates pain 7/10.  Described pain as "achy".  No palliative measure for complaint.         Past Medical History:  Diagnosis Date  . CHF (congestive heart failure) ([place])   . Diabetes mellitus without complication ([place])   . Hypertension     Patient Active Problem List   Diagnosis Date Noted  . SIRS (systemic inflammatory response syndrome) ([place]) 10/04/2020  . Acute respiratory failure with hypoxia ([place]) 07/15/2020  . Multifocal pneumonia 07/15/2020  . Type 2 diabetes mellitus without complication ([place]) 91/63/8466  . History of MI (myocardial infarction) 07/15/2020  . Symptomatic anemia 07/15/2020  . Acute ST elevation myocardial infarction (STEMI) involving left anterior descending (LAD) coronary artery ([place]) 09/09/2018  . STEMI involving left anterior descending coronary artery ([place]) 09/09/2018  . Hypoglycemia 06/02/2016    Past Surgical History:  Procedure Laterality Date  . CORONARY/GRAFT ACUTE MI REVASCULARIZATION N/A 09/09/2018   Procedure: Coronary/Graft Acute MI Revascularization;  Surgeon: Yolonda Kida, MD;  Location: [place] [facility];  Service: Cardiovascular;  Laterality: N/A;  . LEFT HEART CATH AND CORONARY ANGIOGRAPHY N/A 09/09/2018   Procedure: LEFT HEART CATH AND CORONARY ANGIOGRAPHY;  Surgeon: Yolonda Kida, MD;  Location: [place] [facility];  Service: Cardiovascular;   Laterality: N/A;  . none    . STENT PLACEMENT VASCULAR ([place] HX)      Prior to Admission medications   Medication Sig Start Date End Date Taking? Authorizing Provider  aspirin EC 81 MG EC tablet Take 1 tablet (81 mg total) by mouth daily. Patient not taking: Reported on 07/16/2020 09/11/18   Demetrios Loll, MD  atorvastatin (LIPITOR) 80 MG tablet Take 1 tablet (80 mg total) by mouth daily at 6 PM. Patient not taking: Reported on 07/16/2020 09/10/18   Demetrios Loll, MD  carvedilol (COREG) 3.125 MG tablet Take 1 tablet (3.125 mg total) by mouth 2 (two) times daily with a meal. 10/05/20   Lavina Hamman, MD  dapagliflozin propanediol (FARXIGA) 5 MG TABS tablet Take 1 tablet (5 mg total) by mouth daily. 10/05/20   Lavina Hamman, MD  doxycycline (VIBRA-TABS) 100 MG tablet Take 1 tablet (100 mg total) by mouth every 12 (twelve) hours. 10/05/20   Lavina Hamman, MD  fluticasone Asencion Islam) 50 MCG/ACT nasal spray Place 2 sprays into both nostrils daily. 10/05/20   Lavina Hamman, MD  furosemide (LASIX) 20 MG tablet Take 1 tablet (20 mg total) by mouth daily as needed (for weight gain >3lbs in 1 days and >5lbs in 2 days). 10/05/20 10/05/21  Lavina Hamman, MD  gabapentin (NEURONTIN) 100 MG capsule Take 1 capsule (100 mg total) by mouth at bedtime. 10/05/20   Lavina Hamman, MD  guaiFENesin (MUCINEX) 600 MG 12 hr tablet Take 1 tablet (600 mg total) by mouth 2 (two) times daily. 10/05/20   Lavina Hamman, MD  losartan (COZAAR) 25 MG tablet Take  1 tablet (25 mg total) by mouth daily. 10/05/20 10/05/21  Lavina Hamman, MD  metFORMIN (GLUCOPHAGE) 500 MG tablet Take 1 tablet (500 mg total) by mouth daily with breakfast. 08/02/20 10/03/20  Vladimir Crofts, MD  nitroGLYCERIN (NITROSTAT) 0.4 MG SL tablet Place 1 tablet (0.4 mg total) under the tongue every 5 (five) minutes x 3 doses as needed for chest pain. Patient not taking: Reported on 07/16/2020 09/10/18   Demetrios Loll, MD  spironolactone (ALDACTONE) 25 MG tablet Take 1  tablet (25 mg total) by mouth daily. 10/05/20   Lavina Hamman, MD    Allergies Penicillins  No family history on file.  Social History Social History   Tobacco Use  . Smoking status: Current Every Day Smoker  . Smokeless tobacco: Never Used  Vaping Use  . Vaping Use: Never used  Substance Use Topics  . Alcohol use: Yes    Alcohol/week: 0.0 standard drinks  . Drug use: Not Currently    Comment: pt states no found unknown white substance in mouth    Review of Systems  Constitutional: No fever/chills Eyes: No visual changes. ENT: No sore throat. Cardiovascular: Denies chest pain.  Congestive heart failure. Respiratory: Denies shortness of breath. Gastrointestinal: No abdominal pain.  No nausea, no vomiting.  No diarrhea.  No constipation. Genitourinary: Negative for dysuria. Musculoskeletal: Negative for back pain. Skin: Negative for rash. Neurological: Negative for headaches, focal weakness or numbness. Endocrine:  Diabetes and hypertension. Allergic/Immunilogical: Penicillin ____________________________________________   PHYSICAL EXAM:  VITAL SIGNS: ED Triage Vitals [02/12/21 1327]  Enc Vitals Group     BP 119/81     Pulse Rate 96     Resp 18     Temp 98 F (36.7 C)     Temp src      SpO2 100 %     Weight      Height      Head Circumference      Peak Flow      Pain Score 7     Pain Loc      Pain Edu?      Excl. in [place]?     Constitutional: Increased somnolence.   Eyes: Conjunctivae are normal. PERRL. EOMI. Head: Atraumatic. Nose: No congestion/rhinnorhea. Mouth/Throat: Mucous membranes are moist.  Oropharynx non-erythematous. Neck: No stridor.  No cervical spine tenderness to palpation. Hematological/Lymphatic/Immunilogical: No cervical lymphadenopathy. Cardiovascular: Normal rate, regular rhythm. Grossly normal heart sounds.  Good peripheral circulation. Respiratory: Normal respiratory effort.  No retractions. Lungs CTAB. Gastrointestinal: Soft  and nontender. No distention. No abdominal bruits. No CVA tenderness. Genitourinary: Deferred Musculoskeletal: Bilateral pes planus.  Edema to the right great toe.  Hypertrophic nails. Neurologic: Slurred speech. No gross focal neurologic deficits are appreciated. No gait instability. Skin: Ulcer to the plantar aspect of the right great toe.  No rash noted.  Dry flaky skin. Psychiatric: Mood and affect are normal. Speech and behavior are normal.  ____________________________________________   LABS (all labs ordered are listed, but only abnormal results are displayed)  Labs Reviewed  CBG MONITORING, ED - Abnormal; Notable for the following components:      Result Value   Glucose-Capillary 436 (*)    All other components within normal limits  CULTURE, BLOOD (ROUTINE X 2)  CULTURE, BLOOD (ROUTINE X 2)  COMPREHENSIVE METABOLIC PANEL  CBC WITH DIFFERENTIAL/PLATELET  URINE DRUG SCREEN, QUALITATIVE ([facility] ONLY)  ETHANOL  LACTIC ACID, PLASMA  BLOOD GAS, VENOUS   ____________________________________________  EKG   ____________________________________________  RADIOLOGY  I, Sable Feil, personally viewed and evaluated these images (plain radiographs) as part of my medical decision making, as well as reviewing the written report by the radiologist.  ED MD interpretation: No acute findings on x-ray of the right foot.  Official radiology report(s): DG Foot Complete Right  Result Date: 02/12/2021 CLINICAL DATA:  Right great toe ulcer. EXAM: RIGHT FOOT COMPLETE - 3+ VIEW COMPARISON:  No recent prior. FINDINGS: Soft tissue swelling right great toe. No radiopaque foreign body. Subchondral cyst noted about the distal aspect of the proximal phalanx of the right great toe, this is most likely degenerative. No cortical erosion. Mild degenerative change first MTP joint. No evidence of fracture or dislocation. No acute bony abnormality. IMPRESSION: 1. Soft tissue swelling right great toe. No  radiopaque foreign body. Subchondral cyst noted about the distal aspect of the proximal phalanx of the right great toe, this is most likely degenerative. No underlying cortical erosion. 2. Mild degenerative change first MTP joint. No acute bony abnormality. Electronically Signed   By: Marcello Moores  Register   On: 02/12/2021 15:08    ____________________________________________   PROCEDURES  Procedure(s) performed (including Critical Care):  Procedures   ____________________________________________   INITIAL IMPRESSION / ASSESSMENT AND PLAN / ED COURSE  As part of my medical decision making, I reviewed the following data within the [place]         Patient presents with bilateral foot pain.  Patient is presenting with altered mental status questionably secondary to EtOH or diabetes.  Care will be transferred to Dr. Hulan Saas.      ____________________________________________   FINAL CLINICAL IMPRESSION(S) / ED DIAGNOSES  Final diagnoses:  Foot pain, right     ED Discharge Orders    None      *Please note:  Joshua [name] was evaluated in Emergency Department on 02/12/2021 for the symptoms described in the history of present illness. He was evaluated in the context of the global COVID-19 pandemic, which necessitated consideration that the patient might be at risk for infection with the SARS-CoV-2 virus that causes COVID-19. Institutional protocols and algorithms that pertain to the evaluation of patients at risk for COVID-19 are in a state of rapid change based on information released by regulatory bodies including the CDC and federal and state organizations. These policies and algorithms were followed during the patient's care in the ED.  Some ED evaluations and interventions may be delayed as a result of limited staffing during and the pandemic.*   Note:  This document was prepared using Dragon voice recognition software and may include  unintentional dictation errors.    Sable Feil, PA-C 02/12/21 1511    Merlyn Lot, MD 02/12/21 [phone]

## 2021-02-12 NOTE — Progress Notes (Signed)
This nurse attempts to complete admission screening as well as initial assessment. Patient continuously falling asleep during questioning. Unable to complete at this time. Will continue to monitor to ensure comfort and safety.

## 2021-02-12 NOTE — ED Notes (Signed)
Condom cath placed.

## 2021-02-13 ENCOUNTER — Inpatient Hospital Stay: Payer: Medicaid Other

## 2021-02-13 DIAGNOSIS — Z5900 Homelessness unspecified: Secondary | ICD-10-CM

## 2021-02-13 DIAGNOSIS — F1410 Cocaine abuse, uncomplicated: Secondary | ICD-10-CM

## 2021-02-13 LAB — GLUCOSE, CAPILLARY
Glucose-Capillary: 182 mg/dL — ABNORMAL HIGH (ref 70–99)
Glucose-Capillary: 314 mg/dL — ABNORMAL HIGH (ref 70–99)
Glucose-Capillary: 297 mg/dL — ABNORMAL HIGH (ref 70–99)
Glucose-Capillary: 296 mg/dL — ABNORMAL HIGH (ref 70–99)

## 2021-02-13 LAB — URINALYSIS, ROUTINE W REFLEX MICROSCOPIC
Bacteria, UA: NONE SEEN
Bilirubin Urine: NEGATIVE
Glucose, UA: >=500 mg/dL — AB
Hgb urine dipstick: NEGATIVE
Ketones, ur: NEGATIVE mg/dL
Leukocytes,Ua: NEGATIVE
Nitrite: NEGATIVE
Protein, ur: NEGATIVE mg/dL
Specific Gravity, Urine: 1.018 (ref 1.005–1.030)
Squamous Epithelial / HPF: NONE SEEN (ref 0–5)
pH: 7 (ref 5.0–8.0)

## 2021-02-13 LAB — SEDIMENTATION RATE: Sed Rate: 18 mm/hr — ABNORMAL HIGH (ref 0–15)

## 2021-02-13 LAB — COMPREHENSIVE METABOLIC PANEL
ALT: 8 U/L (ref 0–44)
AST: 12 U/L — ABNORMAL LOW (ref 15–41)
Albumin: 2.9 g/dL — ABNORMAL LOW (ref 3.5–5.0)
Alkaline Phosphatase: 70 U/L (ref 38–126)
Anion gap: 6 (ref 5–15)
BUN: 10 mg/dL (ref 6–20)
CO2: 24 mmol/L (ref 22–32)
Calcium: 8.2 mg/dL — ABNORMAL LOW (ref 8.9–10.3)
Chloride: 110 mmol/L (ref 98–111)
Creatinine, Ser: 0.92 mg/dL (ref 0.61–1.24)
GFR, Estimated: >60 mL/min (ref >60)
Glucose, Bld: 194 mg/dL — ABNORMAL HIGH (ref 70–99)
Potassium: 4.1 mmol/L (ref 3.5–5.1)
Sodium: 140 mmol/L (ref 135–145)
Total Bilirubin: 0.6 mg/dL (ref 0.3–1.2)
Total Protein: 6 g/dL — ABNORMAL LOW (ref 6.5–8.1)

## 2021-02-13 LAB — CBC
HCT: 37.8 % — ABNORMAL LOW (ref 39.0–52.0)
Hemoglobin: 12 g/dL — ABNORMAL LOW (ref 13.0–17.0)
MCH: 26.7 pg (ref 26.0–34.0)
MCHC: 31.7 g/dL (ref 30.0–36.0)
MCV: 84.2 fL (ref 80.0–100.0)
Platelets: 274 10*3/uL (ref 150–400)
RBC: 4.49 MIL/uL (ref 4.22–5.81)
RDW: 16.6 % — ABNORMAL HIGH (ref 11.5–15.5)
WBC: 6.2 10*3/uL (ref 4.0–10.5)
nRBC: 0 % (ref 0.0–0.2)

## 2021-02-13 LAB — HEMOGLOBIN A1C
Hgb A1c MFr Bld: 11.9 % — ABNORMAL HIGH (ref 4.8–5.6)
Mean Plasma Glucose: 294.83 mg/dL

## 2021-02-13 LAB — PROTIME-INR
INR: 1.1 (ref 0.8–1.2)
Prothrombin Time: 13.8 seconds (ref 11.4–15.2)

## 2021-02-13 LAB — TSH: TSH: 0.629 u[IU]/mL (ref 0.350–4.500)

## 2021-02-13 LAB — C-REACTIVE PROTEIN: CRP: 1.4 mg/dL — ABNORMAL HIGH (ref <1.0)

## 2021-02-13 LAB — MAGNESIUM: Magnesium: 2 mg/dL (ref 1.7–2.4)

## 2021-02-13 MED ORDER — VANCOMYCIN HCL 1000 MG/200ML IV SOLN
1000.0000 mg | Freq: Two times a day (BID) | INTRAVENOUS | Status: DC
  Administered 2021-02-13: 1000 mg via INTRAVENOUS
  Filled 2021-02-13 (×2): qty 200

## 2021-02-13 MED ORDER — INSULIN GLARGINE 100 UNIT/ML ~~LOC~~ SOLN
10.0000 [IU] | Freq: Every day | SUBCUTANEOUS | Status: DC
  Administered 2021-02-13: 10 [IU] via SUBCUTANEOUS
  Filled 2021-02-13 (×3): qty 0.1

## 2021-02-13 MED ORDER — VANCOMYCIN HCL 1250 MG/250ML IV SOLN
1250.0000 mg | Freq: Two times a day (BID) | INTRAVENOUS | Status: DC
  Administered 2021-02-14 – 2021-02-17 (×7): 1250 mg via INTRAVENOUS
  Filled 2021-02-13 (×10): qty 250

## 2021-02-13 MED ORDER — METRONIDAZOLE IN NACL 5-0.79 MG/ML-% IV SOLN
500.0000 mg | Freq: Three times a day (TID) | INTRAVENOUS | Status: DC
  Administered 2021-02-13 – 2021-02-18 (×13): 500 mg via INTRAVENOUS
  Filled 2021-02-13 (×18): qty 100

## 2021-02-13 MED ORDER — ENSURE MAX PROTEIN PO LIQD
11.0000 [oz_av] | Freq: Two times a day (BID) | ORAL | Status: DC
  Administered 2021-02-13 – 2021-02-18 (×10): 11 [oz_av] via ORAL
  Filled 2021-02-13: qty 330

## 2021-02-13 MED ORDER — SODIUM CHLORIDE 0.9 % IV SOLN
2.0000 g | Freq: Three times a day (TID) | INTRAVENOUS | Status: DC
  Filled 2021-02-13 (×2): qty 2

## 2021-02-13 MED ORDER — ADULT MULTIVITAMIN W/MINERALS CH
1.0000 | ORAL_TABLET | Freq: Every day | ORAL | Status: DC
  Administered 2021-02-14 – 2021-02-19 (×6): 1 via ORAL
  Filled 2021-02-13 (×6): qty 1

## 2021-02-13 MED ORDER — SODIUM CHLORIDE 0.9 % IV SOLN
1.0000 g | Freq: Three times a day (TID) | INTRAVENOUS | Status: DC
  Administered 2021-02-13 (×2): 1 g via INTRAVENOUS
  Filled 2021-02-13 (×4): qty 1

## 2021-02-13 MED ORDER — INSULIN ASPART 100 UNIT/ML ~~LOC~~ SOLN
3.0000 [IU] | Freq: Three times a day (TID) | SUBCUTANEOUS | Status: DC
  Administered 2021-02-13 – 2021-02-17 (×10): 3 [IU] via SUBCUTANEOUS
  Filled 2021-02-13 (×9): qty 1

## 2021-02-13 MED ORDER — SODIUM CHLORIDE 0.9 % IV SOLN
2.0000 g | INTRAVENOUS | Status: DC
  Administered 2021-02-13 – 2021-02-17 (×4): 2 g via INTRAVENOUS
  Filled 2021-02-13 (×6): qty 20

## 2021-02-13 MED ORDER — ASCORBIC ACID 500 MG PO TABS
250.0000 mg | ORAL_TABLET | Freq: Two times a day (BID) | ORAL | Status: DC
  Administered 2021-02-13 – 2021-02-19 (×12): 250 mg via ORAL
  Filled 2021-02-13 (×12): qty 1

## 2021-02-13 NOTE — Progress Notes (Signed)
Inpatient Diabetes Program Recommendations  AACE/ADA: New Consensus Statement on Inpatient Glycemic Control   Target Ranges:  Prepandial:   less than 140 mg/dL      Peak postprandial:   less than 180 mg/dL (1-2 hours)      Critically ill patients:  140 - 180 mg/dL  Results for RAYCE, BRAHMBHATT (MRN [id number]) as of 02/13/2021 12:59  Ref. Range 02/13/2021 07:56 02/13/2021 11:18  Glucose-Capillary Latest Ref Range: 70 - 99 mg/dL 182 (H) 314 (H)   Results for HARSHAN, KEARLEY (MRN [id number]) as of 02/13/2021 07:40  Ref. Range 02/12/2021 14:45 02/12/2021 23:15  Glucose-Capillary Latest Ref Range: 70 - 99 mg/dL 436 (H) 305 (H)  Results for MIRAJ, TRUSS (MRN [id number]) as of 02/13/2021 07:40  Ref. Range 02/12/2021 14:57  Glucose Latest Ref Range: 70 - 99 mg/dL 496 (H)   Results for SAMSON, RALPH (MRN [id number]) as of 02/13/2021 12:59  Ref. Range 07/15/2020 22:59 10/03/2020 22:44 02/13/2021 07:16  Hemoglobin A1C Latest Ref Range: 4.8 - 5.6 % 8.8 (H) 9.0 (H) 11.9 (H)   Review of Glycemic Control  Diabetes history: DM2 Outpatient Diabetes medications: Farxiga 5 mg daily (Not taken in months), Metformin 500 mg-1500 mg QAM Current orders for Inpatient glycemic control: Novolog 0-15 units TID with meals, Novolog 0-5 units QHS  Inpatient Diabetes Program Recommendations:    Insulin: Please consider ordering Lantus 10 units Q24H and Novolog 3 units TID with meals.  HbgA1C: Current A1C 11.9% indicating an average glucose of 295 mg/dl. Prior A1C was 9% on 10/03/20.   NOTE: Per chart, patient is prescribed Metformin and Farxiga for DM as an outpatient. Initial lab glucose 496 mg/dl on 02/12/21. Current A1C 11.9% on 02/13/21. Spoke with patient about diabetes and outpatient regimen for diabetes control. Patient reports being followed by [facility] for diabetes management and currently taking Metformin 500 mg 1-3 pills a day as an outpatient for diabetes control. Patient reports that he was  taking [place] as well after it was prescribed at hospital discharge on 10/05/20. Patient states he took the [place] that he was given from the initial prescription and he has not gotten it filled since. Patient states that he has a prescription for Farxiga at [facility] but he has not went to pick it up. Patient reports that he has also taken Lantus insulin in the past using an insulin pen he would buy from people he knew. Patient states that he thinks the insulin helps his DM control the best and he feels he needs to be taking insulin daily.  Patient reports [facility] and it has been 200-400's mg/dl recently. Patient reports that he is homeless and stays "here and there".   Discussed A1C results (11.9% on 02/13/21) and explained that current A1C indicates an average glucose of 295 mg/dl over the past 2-3 months. Discussed glucose and A1C goals. Discussed importance of checking CBGs and maintaining good CBG control to prevent long-term and short-term complications. Explained how hyperglycemia leads to damage within blood vessels which lead to the common complications seen with uncontrolled diabetes. Stressed to the patient the importance of improving glycemic control to prevent further complications from uncontrolled diabetes. Since patient feels he needs to be on insulin, asked about where he would store the insulin and patient states that he would keep it at church or his brothers as he knows it can not get too hot. Discussed insulin storage, injection sites, rotation  of insulin injection sites. Patient was able to successfully demonstrate correctly how to use an insulin pen without any prior demonstration.   Patient states that he is prescribed insulin at discharge, he would prefer insulin pens.  Patient verbalized understanding of information discussed and reports no further questions at this time related to diabetes. If patient is discharged on insulin, patient  reports he has used Lantus insulin pens in the past and would prefer to use insulin pens (would also need insulin pen needles and glucose monitoring kit).  Thanks, Barnie Alderman, RN, MSN, CDE Diabetes Coordinator Inpatient Diabetes Program [phone] (Team Pager from 8am to 5pm)

## 2021-02-13 NOTE — Hospital Course (Addendum)
Brief History   49yom w/ uncontrolled DM, CHF presented w/ bilateral foot pain. 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  Seen by vascular surgery and podiatry.  Underwent angiogram which showed sufficient right lower extremity blood flow.  Recommendation of distal phalanx amputation, but patient prefers to wait despite multiple discussions. Plan release on oral antibiotic when blood sugar better controlled. TOC following for issues with homelessness.  Likely home 3/9.  A & P  Right great toe diabetic foot infection with osteomyelitis by MRI. --Angiogram did show preserved blood flow to the right lower extremity. --Patient has declined surgery at this point but wants to proceed as an outpatient.  Plan will be to transition to oral antibiotics and discharge home with outpatient follow-up with podiatry.  DM type 2 w/ peripheral polyneuropathy uncontrolled w/ HgbA1c 11.9 --CBG remains labile with poor control.  Increase Lantus 35 units tonight.  Continue meal coverage and sliding scale insulin. --he wants to restart [place] on discharge --he wants to go home on insulin pens  Chronic systolic, diastolic CHF, CAD s/p PCI w/ stent x2 08/2018 --No significant lower extremity edema --continue Coreg, losartan, spironolactone --he is interested in restarting Entresto. This can be done as an outpatient. --needs outpatient follow-up if he will go  Cocaine positive, suspect abuse --can continue carvedilol --TOC consult placed  Smoker  --recommend cessation  Homeless --TOC consult. Needs PCP, housing and medication assistance.

## 2021-02-13 NOTE — Progress Notes (Addendum)
Pharmacy Antibiotic Note  Joshua [name] is a 50 y.o. male admitted on 02/12/2021 with diabetic foot infection/cellulitis of great toe on right foot.  Patient has PMH of DM, HTN, peripheral polyneuropathy, and CHF. Pharmacy has been consulted for Vancomycin and Cefepime dosing.  Plan: Continue Cefepime 2gm IV q8hrs  Will adjust Vancomycin dose to 1250mg  IV q12hrs Goal AUC 400-550. Expected AUC: 486 SCr used: 0.92  Height: 5\' 10"  (177.8 cm) Weight: 81.9 kg (180 lb 8.9 oz) IBW/kg (Calculated) : 73  Temp (24hrs), Avg:98 F (36.7 C), Min:97.4 F (36.3 C), Max:98.4 F (36.9 C)  Recent Labs  Lab 02/12/21 1457 02/12/21 2208 02/13/21 0716  WBC 7.0  --  6.2  CREATININE 1.09  --  0.92  LATICACIDVEN 2.3* 1.3  --     Estimated Creatinine Clearance: 100.3 mL/min (by C-G formula based on SCr of 0.92 mg/dL).    Allergies  Allergen Reactions  . Penicillins Other (See Comments)    Patient unsure of allergy   Antimicrobials this admission:  3/2 vancomycin  >>   3/2 cefepime  >>   Microbiology results:  BCx: pending   Thank you for allowing pharmacy to be a part of this patient's care.  Pernell Dupre, PharmD, BCPS Clinical Pharmacist 02/13/2021 8:38 AM

## 2021-02-13 NOTE — Progress Notes (Signed)
Patient with ASO and diabetic right foot infection   Will discuss with patient and plan angiogram with possible intervention tomorrow

## 2021-02-13 NOTE — Progress Notes (Signed)
Initial Nutrition Assessment  DOCUMENTATION CODES:   Non-severe (moderate) malnutrition in context of social or environmental circumstances  INTERVENTION:   Ensure Max protein supplement BID, each supplement provides 150kcal and 30g of protein.  MVI po daily   Vitamin C 228m po BID  Double protein with meals   Carbohydrate controlled diet- mechanical soft  Basic diabetes diet education   8pm snack  NUTRITION DIAGNOSIS:   Moderate Malnutrition related to social / environmental circumstances (substance abuse, homelessness) as evidenced by moderate fat depletion,moderate muscle depletion.  GOAL:   Patient will meet greater than or equal to 90% of their needs  MONITOR:   PO intake,Supplement acceptance,Labs,Weight trends,I & O's,Skin  REASON FOR ASSESSMENT:   Consult Wound healing  ASSESSMENT:   50y.o. male with medical history significant for chronic combined heart failure with most recent echocardiogram in August 2021 showing LVEF 25 to 30% and grade 3 diastolic dysfunction, hypertension, type 2 diabetes mellitus complicated by peripheral polyneuropathy and substance abuse [facility] with suspected cellulitis due to infected diabetic right foot ulcer   Met with pt in room today. Pt reports good appetite and oral intake at baseline but reports that he is homeless and does not always have access to food. Pt reports that he has been eating well in hospital. RD discussed with pt the importance of adequate nutrition needed to preserve lean muscle and support wound healing. RD provided basic diabetes diet education focusing mainly on eliminating "junk foods", soda and juices that provide little nutrition and will delay his wound healing. RD also discussed with pt how uncontrolled diabetes can delay wound healing. Pt seems to have very little understanding of his diabetes and how this is affected by his food choices. RD will add  supplements (prefers strawberry) and vitamins to support wound healing. RD will also change pt over to a carbohydrate modified diet and add double protein portions with meals. Of note, pt reports that he has poor dentition and requires a mechanical soft diet. Per chart, pt appears weight stable pta.   Medications reviewed and include: ascorbic acid, insulin, ceftriaxone, metronidazole, vancomycin   Labs reviewed: K 4.1 wnl, Mg 2.0 wnl Iron 15(L), TIBC 365, ferritin 16(L), folate 16.0(L), B12 585- 10/23 cbgs- 182, 314, 297 x 24 hrs AIC 11.9(H)- 3/3  NUTRITION - FOCUSED PHYSICAL EXAM:  Flowsheet Row Most Recent Value  Orbital Region Mild depletion  Upper Arm Region Moderate depletion  Thoracic and Lumbar Region Moderate depletion  Buccal Region Moderate depletion  Temple Region Moderate depletion  Clavicle Bone Region Moderate depletion  Clavicle and Acromion Bone Region Moderate depletion  Scapular Bone Region Mild depletion  Dorsal Hand Mild depletion  Patellar Region Moderate depletion  Anterior Thigh Region Moderate depletion  Posterior Calf Region Moderate depletion  Edema (RD Assessment) None  Hair Reviewed  Eyes Reviewed  Mouth Reviewed  Skin Reviewed  Nails Reviewed     Diet Order:   Diet Order            Diet Carb Modified Fluid consistency: Thin; Room service appropriate? Yes  Diet effective now                EDUCATION NEEDS:   Education needs have been addressed  Skin:  Skin Assessment: Reviewed RN Assessment  Last BM:  pta  Height:   Ht Readings from Last 1 Encounters:  02/13/21 5' 10"  (1.778 m)    Weight:   Wt Readings  from Last 1 Encounters:  02/13/21 81.9 kg    Ideal Body Weight:  75.4 kg  BMI:  Body mass index is 25.91 kg/m.  Estimated Nutritional Needs:   Kcal:  2000-2300kcal/day  Protein:  100-115g/day  Fluid:  100-115g/day  Koleen Distance MS, RD, LDN Please refer to [facility] for RD and/or RD on-call/weekend/after hours  pager

## 2021-02-13 NOTE — Progress Notes (Signed)
PROGRESS NOTE  Joshua [name] MWN:[id number] DOB: [birth year] DOA: 02/12/2021 PCP: Patient, No Pcp Per  Brief History   49yom w/ uncontrolled DM, CHF presented w/ bilateral foot pain. Admitted for right great toe diabetic foot infection.  A & P  Right great toe diabetic foot infection. Xray w/o FB or signs of osteo. No fracture. --clinically stable, continue abx, await MRI --consult podiatry, suspect will need at least I&D. Has DP but will check ABI --concern for chronic arterial insufficiency, will consult vascular surgery (routine)  DM type 2 w/ peripheral polyneuropathy uncontrolled w/ HgbA1c 11.9 --poor control, not compliante w/ oral meds as outpatient.  Needs insulin, start Lantus and meal coverage, continue SSI --pt homeless(?) will need to coordinate follow-up  Chronic systolic, diastolic CHF, CAD s/p PCI w/ stent x2 08/2018 --appears compensated  Cocaine positive, suspect abuse --avoid beta-blocker for now --[facility] consult  Smoker  --recommend cessation  Homeless --TOC consult. Needs PCP, housing and medication assistance.  Disposition Plan:  Discussion:   Status is: Inpatient  Remains inpatient appropriate because:IV treatments appropriate due to intensity of illness or inability to take PO and Inpatient level of care appropriate due to severity of illness   Dispo: The patient is from: Home              Anticipated d/c is to: Home              Patient currently is not medically stable to d/c.   Difficult to place patient No  DVT prophylaxis: SCDs Start: 02/12/21 2312   Code Status: Full Code Level of care: Med-Surg Family Communication: none  Murray Hodgkins, MD  [facility] Direct contact: see [email or web address] (further directions at bottom of note if needed) 7PM-7AM contact night coverage as at bottom of note 02/13/2021, 2:38 PM  LOS: 1 day   Significant Hospital Events   .    Consults:  .    Procedures:  .   Significant Diagnostic Tests:   Marland Kitchen    Micro Data:  .    Antimicrobials:  .   Interval History/Subjective  CC: f/u DM toe infection  Feels ok, has some pain in left foot more than right Otherwise seems to feel ok  Objective   Vitals:  Vitals:   02/13/21 0753 02/13/21 1117  BP: 139/90 130/79  Pulse: 81 85  Resp: 17 17  Temp: 97.9 F (36.6 C) 97.8 F (36.6 C)  SpO2: 100% 97%    Exam:  Constitutional:   . Appears calm and comfortable ENMT:  . grossly normal hearing  Respiratory:  . CTA bilaterally, no w/r/r.  . Respiratory effort normal.  Cardiovascular:  . RRR, no m/r/g . No LE extremity edema  . DP 2+ bilaterally  Skin:  . Right great toe w/ ulcer posterior side, nontender. Discoloration of toes of right foot, darker than surrounding skin Psychiatric:  . Mental status o Mood, affect appropriate  I have personally reviewed the following:   Today's Data  . CBG labile, up to 436, no lows . CMP noted . CBC unremarkable . Hgb A1c noted . TSH WNL . Cocaine positive  Scheduled Meds: . carvedilol  3.125 mg Oral BID WC  . fluticasone  2 spray Each Nare Daily  . gabapentin  100 mg Oral QHS  . insulin aspart  0-15 Units Subcutaneous TID WC  . insulin aspart  0-5 Units Subcutaneous QHS  . insulin aspart  3 Units Subcutaneous TID WC  . insulin glargine  10 Units  Subcutaneous QHS  . losartan  25 mg Oral Daily   Continuous Infusions: . cefTRIAXone (ROCEPHIN)  IV     And  . metronidazole    . vancomycin      Principal Problem:   Cellulitis of great toe of right foot Active Problems:   Type 2 diabetes mellitus without complication (HCC)   Diabetic foot ulcer (HCC)   Lactic acidosis   Acute metabolic encephalopathy   Chronic combined systolic (congestive) and diastolic (congestive) heart failure ([place])   Cocaine abuse ([place])   Homeless   LOS: 1 day   How to contact the [facility] Attending or Consulting provider 7A - 7P or covering provider during after hours [place], for this patient?   1. Check the care team in [facility] and look for a) attending/consulting TRH provider listed and b) the [facility] team listed 2. Log into [email or web address] and use [place]'s universal password to access. If you do not have the password, please contact the hospital operator. 3. Locate the [facility] provider you are looking for under [facility] and page to a number that you can be directly reached. 4. If you still have difficulty reaching the provider, please page the [facility] (Director on Call) for the Hospitalists listed on amion for assistance.

## 2021-02-13 NOTE — Consult Note (Signed)
ORTHOPAEDIC CONSULTATION  REQUESTING PHYSICIAN: Samuella Cota, MD  Chief Complaint: Right great toe ulcer  HPI: Joshua [name] is a 50 y.o. male who complains of worsening pain and swelling to his right great toe.  He was admitted through the emergency room.  Has a hemoglobin A1c of about 11.  He states he is homeless.  He does not take medications regularly.  He has had numbness to his foot.  He is noticed a wound and has been picking at the skin and callus to the area for some time now.  Little bit of worsening pain to the region more recently.  Admitted x-rays are negative and MRI is pending currently  Past Medical History:  Diagnosis Date  . CHF (congestive heart failure) ([place])   . Diabetes mellitus without complication ([place])   . Hypertension    Past Surgical History:  Procedure Laterality Date  . CORONARY/GRAFT ACUTE MI REVASCULARIZATION N/A 09/09/2018   Procedure: Coronary/Graft Acute MI Revascularization;  Surgeon: Yolonda Kida, MD;  Location: [place] [facility];  Service: Cardiovascular;  Laterality: N/A;  . LEFT HEART CATH AND CORONARY ANGIOGRAPHY N/A 09/09/2018   Procedure: LEFT HEART CATH AND CORONARY ANGIOGRAPHY;  Surgeon: Yolonda Kida, MD;  Location: [place] [facility];  Service: Cardiovascular;  Laterality: N/A;  . none    . STENT PLACEMENT VASCULAR ([place] [facility])     Social History   Socioeconomic History  . Marital status: Single    Spouse name: Not on file  . Number of children: Not on file  . Years of education: Not on file  . Highest education level: Not on file  Occupational History  . Not on file  Tobacco Use  . Smoking status: Current Every Day Smoker  . Smokeless tobacco: Never Used  Vaping Use  . Vaping Use: Never used  Substance and Sexual Activity  . Alcohol use: Yes    Alcohol/week: 0.0 standard drinks  . Drug use: Not Currently    Comment: pt states no found unknown white substance in mouth  . Sexual activity: Not on file   Other Topics Concern  . Not on file  Social History Narrative  . Not on file   Social Determinants of Health   Financial Resource Strain: Not on file  Food Insecurity: Not on file  Transportation Needs: Not on file  Physical Activity: Not on file  Stress: Not on file  Social Connections: Not on file   History reviewed. No pertinent family history. Allergies  Allergen Reactions  . Penicillins Other (See Comments)    Patient unsure of allergy   Prior to Admission medications   Medication Sig Start Date End Date Taking? Authorizing Provider  carvedilol (COREG) 3.125 MG tablet Take 1 tablet (3.125 mg total) by mouth 2 (two) times daily with a meal. 10/05/20  Yes Lavina Hamman, MD  dapagliflozin propanediol (FARXIGA) 5 MG TABS tablet Take 1 tablet (5 mg total) by mouth daily. 10/05/20  Yes Lavina Hamman, MD  fluticasone (FLONASE) 50 MCG/ACT nasal spray Place 2 sprays into both nostrils daily. 10/05/20  Yes Lavina Hamman, MD  furosemide (LASIX) 20 MG tablet Take 1 tablet (20 mg total) by mouth daily as needed (for weight gain >3lbs in 1 days and >5lbs in 2 days). 10/05/20 10/05/21 Yes Lavina Hamman, MD  gabapentin (NEURONTIN) 100 MG capsule Take 1 capsule (100 mg total) by mouth at bedtime. 10/05/20  Yes Lavina Hamman, MD  losartan (COZAAR) 25  MG tablet Take 1 tablet (25 mg total) by mouth daily. 10/05/20 10/05/21 Yes Lavina Hamman, MD  metFORMIN (GLUCOPHAGE) 500 MG tablet Take 1 tablet (500 mg total) by mouth daily with breakfast. 08/02/20 10/03/20 Yes Vladimir Crofts, MD  spironolactone (ALDACTONE) 25 MG tablet Take 1 tablet (25 mg total) by mouth daily. 10/05/20  Yes Lavina Hamman, MD  nitroGLYCERIN (NITROSTAT) 0.4 MG SL tablet Place 1 tablet (0.4 mg total) under the tongue every 5 (five) minutes x 3 doses as needed for chest pain. Patient not taking: Reported on 07/16/2020 09/10/18   Demetrios Loll, MD   CT Head Wo Contrast  Result Date: 02/12/2021 CLINICAL DATA:  Mental status  changes EXAM: CT HEAD WITHOUT CONTRAST TECHNIQUE: Contiguous axial images were obtained from the base of the skull through the vertex without intravenous contrast. COMPARISON:  10/04/2019 FINDINGS: Brain: No evidence of acute infarction, hemorrhage, hydrocephalus, extra-axial collection or mass lesion/mass effect. Vascular: No hyperdense vessel or unexpected calcification. Skull: Normal. Negative for fracture or focal lesion. Sinuses/Orbits: Mild mucosal thickening is noted within the sphenoid sinus on the left. Other: None. IMPRESSION: Mild chronic mucosal thickening in the sphenoid sinus. No intracranial abnormality is noted. Electronically Signed   By: Inez Catalina M.D.   On: 02/12/2021 16:13   DG Foot Complete Right  Result Date: 02/12/2021 CLINICAL DATA:  Right great toe ulcer. EXAM: RIGHT FOOT COMPLETE - 3+ VIEW COMPARISON:  No recent prior. FINDINGS: Soft tissue swelling right great toe. No radiopaque foreign body. Subchondral cyst noted about the distal aspect of the proximal phalanx of the right great toe, this is most likely degenerative. No cortical erosion. Mild degenerative change first MTP joint. No evidence of fracture or dislocation. No acute bony abnormality. IMPRESSION: 1. Soft tissue swelling right great toe. No radiopaque foreign body. Subchondral cyst noted about the distal aspect of the proximal phalanx of the right great toe, this is most likely degenerative. No underlying cortical erosion. 2. Mild degenerative change first MTP joint. No acute bony abnormality. Electronically Signed   By: Marcello Moores  Register   On: 02/12/2021 15:08    Positive ROS: All other systems have been reviewed and were otherwise negative with the exception of those mentioned in the HPI and as above.  12 point ROS was performed.  Physical Exam: General: Alert and oriented.  No apparent distress.  Vascular:  Left foot:Dorsalis Pedis:  present Posterior Tibial:  present  Right foot: Dorsalis Pedis:   present Posterior Tibial:  diminished  Neuro:absent protective sensation  Derm: Left foot without ulceration or preulcerative lesion  On the distal aspect of the right great toe is an open ulceration approximately a centimeter in diameter.  Does not probe deep past the superficial tissue.  No signs of purulence at this time.  Surrounding hyperkeratotic tissue is noted.  Ortho/MS: There is diffuse edema to the right great toe at this time.  No crepitance or range of motion.  Personally reviewed the x-rays which are negative for erosive changes.  Assessment: Diabetes with neuropathy in distal right great toe ulceration  Plan: X-rays currently are negative.  The wound does not probe deep.  There is no signs of active purulent drainage.  There is diffuse edema to the great toe somewhat concerning at this time.  No crepitance with range of motion.  We will await the MRI.  If MRI is negative can continue with local wound care consisting of a biotic ointment and a padded bandage on the distal aspect of  the great toe.  If MRI shows osteomyelitis will discuss with patient debridement of distal great toe.  We will follow-up tomorrow    Elesa Hacker, DPM Cell [phone]   02/13/2021 1:10 PM

## 2021-02-13 NOTE — Progress Notes (Signed)
Pharmacy Antibiotic Note  Joshua [name] is a 50 y.o. male admitted on 02/12/2021 with cellulitis.  Pharmacy has been consulted for Vancomycin and Cefepime dosing.  Plan: Cefepime 1gm IV q8hrs Vancomycin 1gm IV q12hrs (previous dosing regimen)     Temp (24hrs), Avg:97.9 F (36.6 C), Min:97.4 F (36.3 C), Max:98.4 F (36.9 C)  Recent Labs  Lab 02/12/21 1457 02/12/21 2208  WBC 7.0  --   CREATININE 1.09  --   LATICACIDVEN 2.3* 1.3    CrCl cannot be calculated (Unknown ideal weight.).    Allergies  Allergen Reactions  . Penicillins Other (See Comments)    Patient unsure of allergy   Antimicrobials this admission:   >>    >>   Dose adjustments this admission:   Microbiology results:  BCx:   UCx:    Sputum:    MRSA PCR:   Thank you for allowing pharmacy to be a part of this patient's care.  Hart Robinsons A 02/13/2021 1:35 AM

## 2021-02-14 ENCOUNTER — Encounter
Admission: EM | Disposition: A | Discharge status: Home / Self Care | Payer: Self-pay | Source: Home / Self Care | Attending: Family Medicine

## 2021-02-14 ENCOUNTER — Inpatient Hospital Stay: Payer: Medicaid Other

## 2021-02-14 DIAGNOSIS — E11628 Type 2 diabetes mellitus with other skin complications: Secondary | ICD-10-CM | POA: Diagnosis not present

## 2021-02-14 DIAGNOSIS — E119 Type 2 diabetes mellitus without complications: Secondary | ICD-10-CM | POA: Diagnosis not present

## 2021-02-14 DIAGNOSIS — L089 Local infection of the skin and subcutaneous tissue, unspecified: Secondary | ICD-10-CM

## 2021-02-14 DIAGNOSIS — S90424A Blister (nonthermal), right lesser toe(s), initial encounter: Secondary | ICD-10-CM

## 2021-02-14 DIAGNOSIS — E440 Moderate protein-calorie malnutrition: Secondary | ICD-10-CM | POA: Insufficient documentation

## 2021-02-14 DIAGNOSIS — I2510 Atherosclerotic heart disease of native coronary artery without angina pectoris: Secondary | ICD-10-CM

## 2021-02-14 DIAGNOSIS — M869 Osteomyelitis, unspecified: Secondary | ICD-10-CM

## 2021-02-14 DIAGNOSIS — I70235 Atherosclerosis of native arteries of right leg with ulceration of other part of foot: Secondary | ICD-10-CM

## 2021-02-14 DIAGNOSIS — M79671 Pain in right foot: Secondary | ICD-10-CM

## 2021-02-14 HISTORY — PX: LOWER EXTREMITY ANGIOGRAPHY: CATH118251

## 2021-02-14 LAB — GLUCOSE, CAPILLARY
Glucose-Capillary: 312 mg/dL — ABNORMAL HIGH (ref 70–99)
Glucose-Capillary: 106 mg/dL — ABNORMAL HIGH (ref 70–99)
Glucose-Capillary: 159 mg/dL — ABNORMAL HIGH (ref 70–99)
Glucose-Capillary: 120 mg/dL — ABNORMAL HIGH (ref 70–99)
Glucose-Capillary: 330 mg/dL — ABNORMAL HIGH (ref 70–99)

## 2021-02-14 IMAGING — MR MR FOOT*R* WO/W CM
9 series · 40 of 40 positions shown · IV contrast (gadavist)
Comparison: Radiographs [DATE]

CLINICAL DATA: Diabetic foot ulcer along the great toe.
Osteomyelitis.

EXAM:
MRI OF THE RIGHT FOREFOOT WITHOUT AND WITH CONTRAST
TECHNIQUE: Multiplanar, multisequence MR imaging of the right forefoot was
performed before and after the administration of intravenous
contrast.
CONTRAST:  8mL GADAVIST GADOBUTROL 1 MMOL/ML IV SOLN

[Series 6: T1 · coronal · right · 3.0mm · 0.38mm/px · 6 of 45 slices shown (1 of 2)]
[im 1/45]
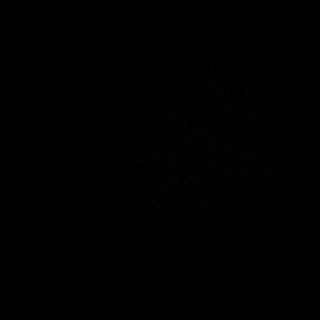
[im 9/45]
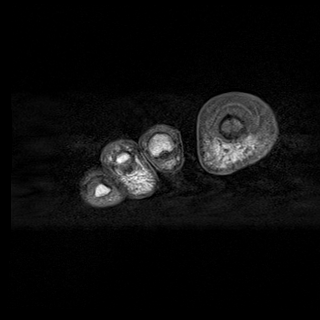
[im 18/45]
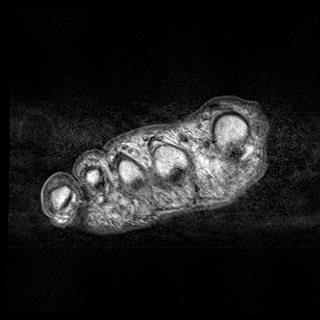
[im 27/45]
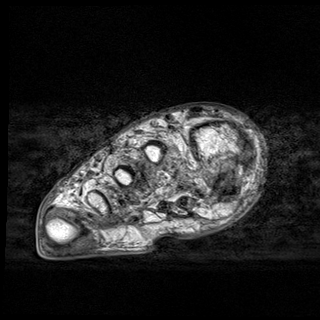
[im 36/45]
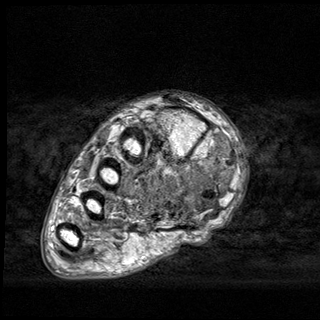
[im 45/45]
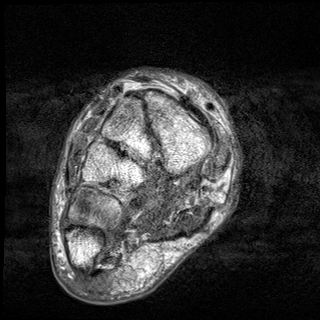

[Series 8: T2 · coronal · right · 3.0mm · 0.38mm/px · 6 of 45 slices shown (1 of 2)]
[im 1/45]
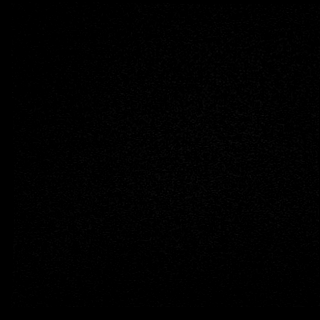
[im 9/45]
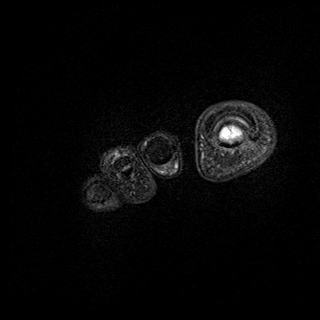
[im 18/45]
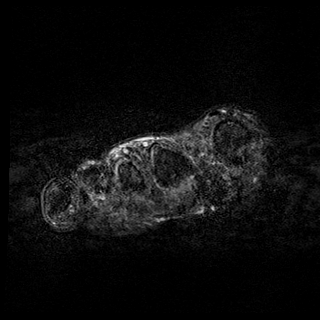
[im 27/45]
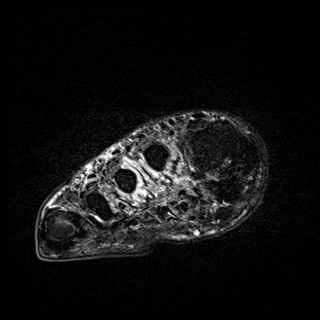
[im 36/45]
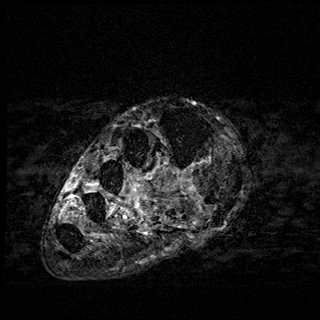
[im 45/45]
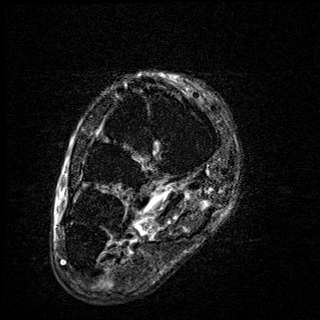

[Series 9: T1 · axial · right · 3.0mm · 0.70mm/px · z∈[-119,-47]mm · 3 of 22 slices shown (2 of 2)]
[im 1/22]
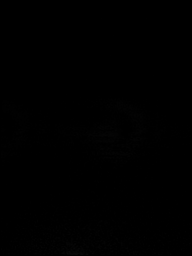
[im 11/22]
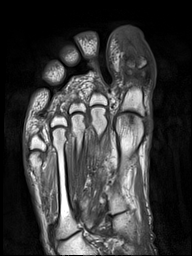
[im 22/22]
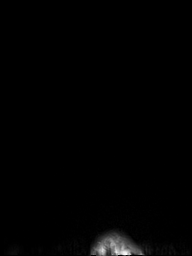

[Series 11: T2 · axial · right · 3.0mm · 0.70mm/px · z∈[-119,-47]mm · 3 of 22 slices shown (2 of 2)]
[im 1/22]
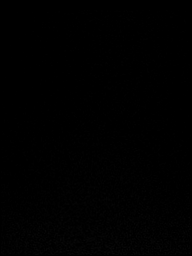
[im 11/22]
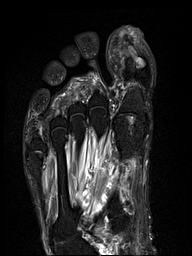
[im 22/22]
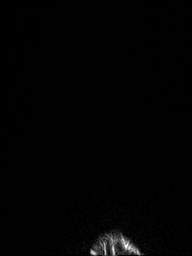

[Series 12: STIR · sagittal · right · 3.0mm · 0.62mm/px · 4 of 31 slices shown]
[im 1/31]
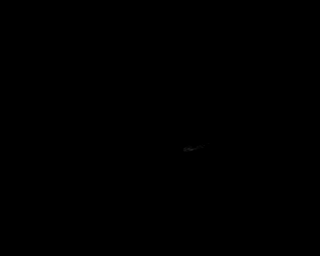
[im 11/31]
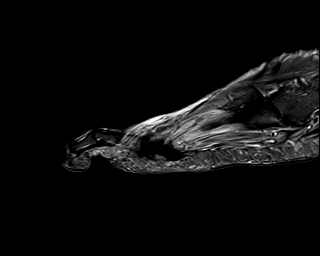
[im 21/31]
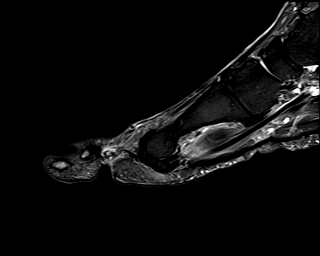
[im 31/31]
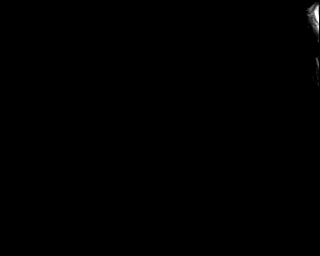

[Series 13: T1 fat-sat · coronal · non-contrast · right · 3.0mm · 0.47mm/px · 6 of 45 slices shown (1 of 3)]
[im 1/45]
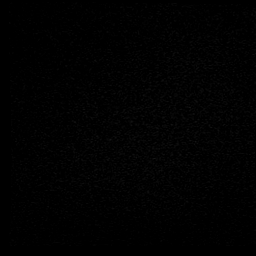
[im 9/45]
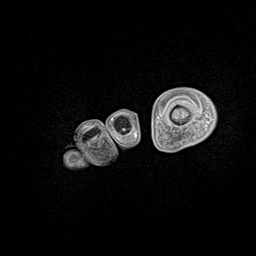
[im 18/45]
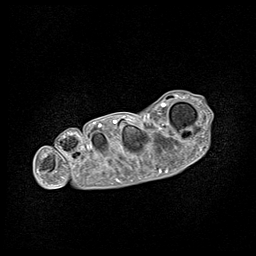
[im 27/45]
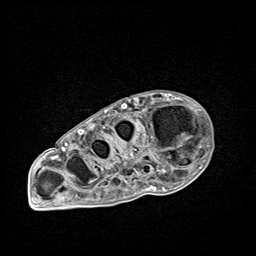
[im 36/45]
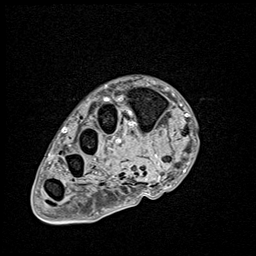
[im 45/45]
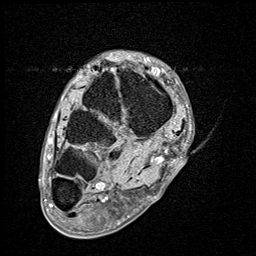

[Series 14: T1 fat-sat post-contrast · coronal · right · 3.0mm · 0.47mm/px · 6 of 45 slices shown]
[im 1/45]
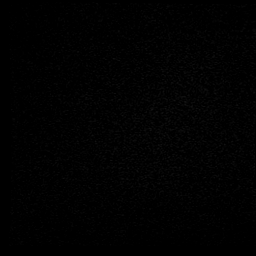
[im 9/45]
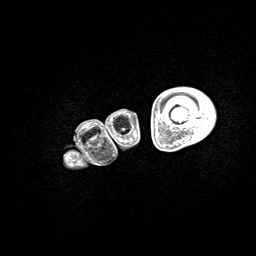
[im 18/45]
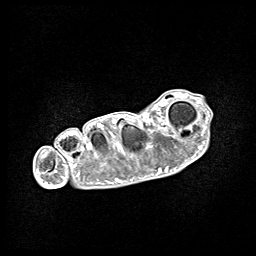
[im 27/45]
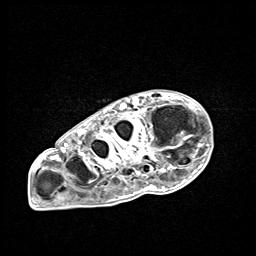
[im 36/45]
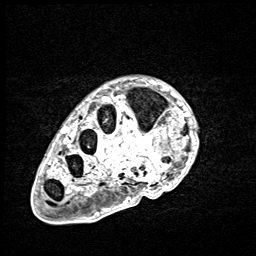
[im 45/45]
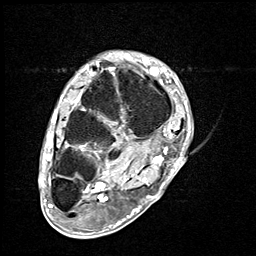

[Series 15: T1 fat-sat · sagittal · right · 3.0mm · 0.62mm/px · 3 of 25 slices shown (2 of 3)]
[im 1/25]
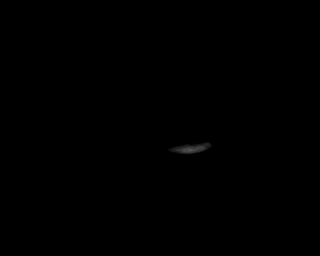
[im 13/25]
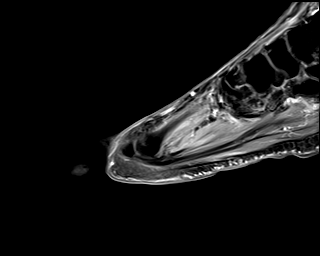
[im 25/25]
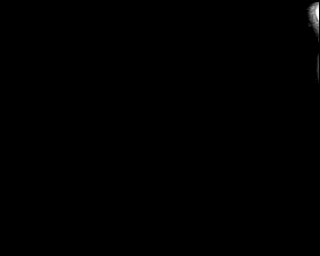

[Series 16: T1 fat-sat · axial · right · 3.0mm · 0.56mm/px · z∈[-119,-47]mm · 3 of 22 slices shown (3 of 3)]
[im 1/22]
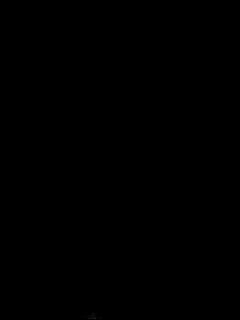
[im 11/22]
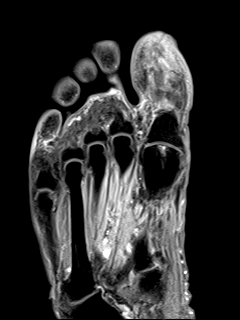
[im 22/22]
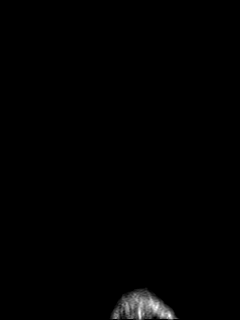

[40 of 40 positions shown; findings below may reference images not displayed]

FINDINGS: Bones/Joint/Cartilage

Diffuse abnormal accentuated edema signal and enhancement in the
distal phalanx of the great toe compatible with osteomyelitis.
Subtle cortical demineralization suggested on the T1 weighted
images.

Non-fragmented osteochondral lesions of the fifth distal first
metatarsal head for example on image 13 of series 11, likely
chronic. Small erosion or degenerative subcortical cyst medially
along the head of the proximal phalanx great toe. No significant
effusion of the first interphalangeal joint or first MTP joint.

Subtle accentuated T2 signal of the distal phalanges of the second
and third toes but without reduced T1 signal; this may well be
incidental and is probably not from osteomyelitis.

Ligaments

Lisfranc ligament intact.

Muscles and Tendons

Low-level edema in the plantar musculature of the foot, probably
neurogenic, less likely to be from low-grade myositis.

Soft tissues

Diffuse subcutaneous edema and enhancement in the great toe
compatible with cellulitis. No drainable abscess identified.
IMPRESSION: 1. Osteomyelitis of the distal phalanx of the great toe.
2. Diffuse subcutaneous edema and enhancement in the great toe
compatible with cellulitis. No drainable abscess identified.
3. Low-level edema in the plantar musculature of the foot, probably
neurogenic, less likely to be from low-grade myositis.
4. Non-fragmented osteochondral lesions of the distal first
metatarsal head and medially along the head of the proximal phalanx
great toe.
5. Faintly accentuated T2 signal in the distal phalanges of the
second and third toes, but without corresponding reduced T1 signal,
not considered specific for osteomyelitis and probably incidental.

## 2021-02-14 SURGERY — LOWER EXTREMITY ANGIOGRAPHY
Anesthesia: Moderate Sedation | Laterality: Right

## 2021-02-14 MED ORDER — FENTANYL CITRATE (PF) 100 MCG/2ML IJ SOLN
INTRAMUSCULAR | Status: AC
  Filled 2021-02-14: qty 2

## 2021-02-14 MED ORDER — SODIUM CHLORIDE 0.9 % IV SOLN
250.0000 mL | INTRAVENOUS | Status: DC | PRN
  Administered 2021-02-14: 250 mL via INTRAVENOUS

## 2021-02-14 MED ORDER — INSULIN GLARGINE 100 UNIT/ML ~~LOC~~ SOLN
16.0000 [IU] | Freq: Every day | SUBCUTANEOUS | Status: DC
  Administered 2021-02-14: 16 [IU] via SUBCUTANEOUS
  Filled 2021-02-14 (×2): qty 0.16

## 2021-02-14 MED ORDER — SODIUM CHLORIDE 0.9% FLUSH
3.0000 mL | INTRAVENOUS | Status: DC | PRN
Start: 2021-02-14 — End: 2021-02-19

## 2021-02-14 MED ORDER — MIDAZOLAM HCL 5 MG/5ML IJ SOLN
INTRAMUSCULAR | Status: AC
  Filled 2021-02-14: qty 5

## 2021-02-14 MED ORDER — FENTANYL CITRATE (PF) 100 MCG/2ML IJ SOLN
INTRAMUSCULAR | Status: DC | PRN
  Administered 2021-02-14: 50 ug via INTRAVENOUS

## 2021-02-14 MED ORDER — IODIXANOL 320 MG/ML IV SOLN
INTRAVENOUS | Status: DC | PRN
Start: 2021-02-14 — End: 2021-02-14
  Administered 2021-02-14: 55 mL via INTRA_ARTERIAL

## 2021-02-14 MED ORDER — OXYCODONE HCL 5 MG PO TABS: 5.0000 mg | ORAL_TABLET | ORAL | Status: DC | PRN

## 2021-02-14 MED ORDER — MIDAZOLAM HCL 2 MG/2ML IJ SOLN
INTRAMUSCULAR | Status: DC | PRN
  Administered 2021-02-14: 2 mg via INTRAVENOUS

## 2021-02-14 MED ORDER — SODIUM CHLORIDE 0.9 % IV SOLN: INTRAVENOUS | Status: AC

## 2021-02-14 MED ORDER — GADOBUTROL 1 MMOL/ML IV SOLN
8.0000 mL | Freq: Once | INTRAVENOUS | Status: AC | PRN
  Administered 2021-02-14: 8 mL via INTRAVENOUS

## 2021-02-14 MED ORDER — SODIUM CHLORIDE 0.9% FLUSH
3.0000 mL | Freq: Two times a day (BID) | INTRAVENOUS | Status: DC
  Administered 2021-02-14 – 2021-02-18 (×8): 3 mL via INTRAVENOUS

## 2021-02-14 MED ORDER — MORPHINE SULFATE (PF) 4 MG/ML IV SOLN: 2.0000 mg | INTRAVENOUS | Status: DC | PRN

## 2021-02-14 SURGICAL SUPPLY — 9 items
CANNULA 5F STIFF (CANNULA) ×1 IMPLANT
CATH ANGIO 5F PIGTAIL 65CM (CATHETERS) ×1 IMPLANT
DEVICE STARCLOSE SE CLOSURE (Vascular Products) ×1 IMPLANT
GLIDEWIRE ADV .035X260CM (WIRE) ×1 IMPLANT
PACK ANGIOGRAPHY (CUSTOM PROCEDURE TRAY) ×2 IMPLANT
SHEATH BRITE TIP 5FRX11 (SHEATH) ×1 IMPLANT
SYR MEDRAD MARK 7 150ML (SYRINGE) ×1 IMPLANT
TUBING CONTRAST HIGH PRESS 72 (TUBING) ×1 IMPLANT
WIRE GUIDERIGHT .035X150 (WIRE) ×2 IMPLANT

## 2021-02-14 NOTE — Interval H&P Note (Signed)
History and Physical Interval Note:  02/14/2021 11:34 AM  Joshua [name]  has presented today for surgery, with the diagnosis of ASO with diabetic foot infection.  The various methods of treatment have been discussed with the patient and family. After consideration of risks, benefits and other options for treatment, the patient has consented to  Procedure(s): Lower Extremity Angiography (Right) as a surgical intervention.  The patient's history has been reviewed, patient examined, no change in status, stable for surgery.  I have reviewed the patient's chart and labs.  Questions were answered to the patient's satisfaction.     Hortencia Pilar

## 2021-02-14 NOTE — Progress Notes (Signed)
Inpatient Diabetes Program Recommendations  AACE/ADA: New Consensus Statement on Inpatient Glycemic Control (2015)  Target Ranges:  Prepandial:   less than 140 mg/dL      Peak postprandial:   less than 180 mg/dL (1-2 hours)      Critically ill patients:  140 - 180 mg/dL   Results for Joshua [name], Joshua [name] (MRN [id number]) as of 02/14/2021 09:33  Ref. Range 02/13/2021 07:56 02/13/2021 11:18 02/13/2021 16:53 02/13/2021 21:43  Glucose-Capillary Latest Ref Range: 70 - 99 mg/dL 182 (H)  3 units NOVOLOG _0 :41am 314 (H)  11 units NOVOLOG _1 :23pm  297 (H)  11 units NOVOLOG _2 :16pm 296 (H)  3 units NOVOLOG _3 :13pm  10 units LANTUS   Results for Joshua [name], Joshua [name] (MRN [id number]) as of 02/14/2021 09:33  Ref. Range 02/14/2021 08:50  Glucose-Capillary Latest Ref Range: 70 - 99 mg/dL 312 (H)  14 units NOVOLOG    Home DM Meds: Farxiga 5 mg daily (Not taken in months)       Metformin 500 mg-1500 mg QAM   Current Orders: Lantus 10 units QHS       Novolog 0-15 units ac/hs      Novolog 3 units TID with meals    If patient is discharged on insulin, patient reports he has used Lantus insulin pens in the past and would prefer to use insulin pens (would also need insulin pen needles and glucose monitoring kit).   MD- Note CBG 312 this AM.  Also elevated yesterday afternoon.  Please consider:  1. Increase Lantus to 16 units QHS (0.2 units/kg)  2. Increase Novolog Meal Coverage to 6 units TID with meals    --Will follow patient during hospitalization--  Wyn Quaker RN, MSN, CDE Diabetes Coordinator Inpatient Glycemic Control Team Team Pager: [phone] (8a-5p)

## 2021-02-14 NOTE — Progress Notes (Signed)
Set with the patient and discussed the results of his angiogram.  His right lower extremity is essentially normal.  He has mild small vessel disease in the forefoot and he should be able to heal what ever surgical intervention is needed.  Of note, his left foot definitely has moderate distal small vessel disease in the forefoot.  I did comment that in a way it is good his right great toe is the problem because his left great toe which certainly be a much bigger issue.  I have encouraged him to opt for the distal toe amputation to treat the osteomyelitis and that would allow for a shortened course of antibiotics as compared to trying to eradicate the osteomyelitis with antibiotic therapy alone which would be a very prolonged course of antibiotics.  He asked excellent questions and seems to have a pretty good understanding.  At this time I believe he is strongly considering the distal toe amputation

## 2021-02-14 NOTE — Consult Note (Signed)
NAMETrayshawn [name]  DOB: 1971-07-01  MRN: [id number]  Date/Time: 02/14/2021 1:03 PM  REQUESTING PROVIDER: Sarajane Jews Subjective:  REASON FOR CONSULT: foot infection ? Joshua [name] is a 50 y.o. 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 I ama sked to see the patient for antibiotic management Pt is hesitant to undergo amputation Was seen by vascular and had angio today  Past Medical History:  Diagnosis Date  . CHF (congestive heart failure) ([facility])   . Diabetes mellitus without complication ([facility])   . Hypertension     Past Surgical History:  Procedure Laterality Date  . CORONARY/GRAFT ACUTE MI REVASCULARIZATION N/A 09/09/2018   Procedure: Coronary/Graft Acute MI Revascularization;  Surgeon: Yolonda Kida, MD;  Location: [facility];  Service: Cardiovascular;  Laterality: N/A;  . LEFT HEART CATH AND CORONARY ANGIOGRAPHY N/A 09/09/2018   Procedure: LEFT HEART CATH AND CORONARY ANGIOGRAPHY;  Surgeon: Yolonda Kida, MD;  Location: [facility];  Service: Cardiovascular;  Laterality: N/A;  . none    . STENT PLACEMENT VASCULAR ([facility])      Social History   Socioeconomic History  . Marital status: Single    Spouse name: Not on file  . Number of children: Not on file  . Years of education: Not on file  . Highest education level: Not on file  Occupational History  . Not on file  Tobacco Use  . Smoking status: Current Every Day Smoker  . Smokeless tobacco: Never Used  Vaping Use  . Vaping Use: Never used  Substance and Sexual Activity  . Alcohol  use: Yes    Alcohol/week: 0.0 standard drinks  . Drug use: Not Currently    Comment: pt states no found unknown white substance in mouth  . Sexual activity: Not on file  Other Topics Concern  . Not on file  Social History Narrative  . Not on file   Social Determinants of Health   Financial Resource Strain: Not on file  Food Insecurity: Not on file  Transportation Needs: Not on file  Physical Activity: Not on file  Stress: Not on file  Social Connections: Not on file  Intimate Partner Violence: Not on file    History reviewed. No pertinent family history. Allergies  Allergen Reactions  . Penicillins Other (See Comments)    Patient unsure of allergy   I? Current Facility-Administered Medications  Medication Dose Route Frequency Provider Last Rate Last Admin  . acetaminophen (TYLENOL) tablet 650 mg  650 mg Oral Q6H PRN Howerter, Justin B, DO       Or  . acetaminophen (TYLENOL) suppository 650 mg  650 mg Rectal Q6H PRN Howerter, Justin B, DO      . ascorbic acid (VITAMIN C) tablet 250 mg  250 mg Oral BID Samuella Cota, MD   250 mg at 02/14/21 0854  . carvedilol (COREG) tablet 3.125 mg  3.125 mg Oral BID WC Howerter, Justin B, DO   3.125 mg at 02/14/21  7616  . cefTRIAXone (ROCEPHIN) 2 g in sodium chloride 0.9 % 100 mL IVPB  2 g Intravenous Q24H Samuella Cota, MD   Stopped at 02/13/21 2155   And  . metroNIDAZOLE (FLAGYL) IVPB 500 mg  500 mg Intravenous Q8H Samuella Cota, MD 100 mL/hr at 02/14/21 0859 500 mg at 02/14/21 0859  . fluticasone (FLONASE) 50 MCG/ACT nasal spray 2 spray  2 spray Each Nare Daily Howerter, Justin B, DO   2 spray at 02/14/21 0902  . gabapentin (NEURONTIN) capsule 100 mg  100 mg Oral QHS Howerter, Justin B, DO   100 mg at 02/13/21 2313  . insulin aspart (novoLOG) injection 0-15 Units  0-15 Units Subcutaneous TID WC Howerter, Justin B, DO   11 Units at 02/14/21 0856  . insulin aspart (novoLOG) injection 0-5 Units  0-5 Units Subcutaneous QHS  Howerter, Justin B, DO   3 Units at 02/13/21 2313  . insulin aspart (novoLOG) injection 3 Units  3 Units Subcutaneous TID WC Samuella Cota, MD   3 Units at 02/14/21 0900  . insulin glargine (LANTUS) injection 10 Units  10 Units Subcutaneous QHS Samuella Cota, MD   10 Units at 02/13/21 2312  . losartan (COZAAR) tablet 25 mg  25 mg Oral Daily Howerter, Justin B, DO   25 mg at 02/14/21 0854  . multivitamin with minerals tablet 1 tablet  1 tablet Oral Daily Samuella Cota, MD   1 tablet at 02/14/21 0854  . protein supplement (ENSURE MAX) liquid  11 oz Oral BID Samuella Cota, MD   11 oz at 02/13/21 2314  . vancomycin (VANCOREADY) IVPB 1250 mg/250 mL  1,250 mg Intravenous Q12H Pernell Dupre, RPH 166.7 mL/hr at 02/14/21 0539 Infusion Verify at 02/14/21 0539     Abtx:  Anti-infectives (From admission, onward)   Start     Dose/Rate Route Frequency Ordered Stop   02/13/21 1800  vancomycin (VANCOREADY) IVPB 1250 mg/250 mL        1,250 mg 166.7 mL/hr over 90 Minutes Intravenous Every 12 hours 02/13/21 0835     02/13/21 1700  ceFEPIme (MAXIPIME) 2 g in sodium chloride 0.9 % 100 mL IVPB  Status:  Discontinued        2 g 200 mL/hr over 30 Minutes Intravenous Every 8 hours 02/13/21 1011 02/13/21 1436   02/13/21 1600  cefTRIAXone (ROCEPHIN) 2 g in sodium chloride 0.9 % 100 mL IVPB       "And" Linked Group Details   2 g 200 mL/hr over 30 Minutes Intravenous Every 24 hours 02/13/21 1436     02/13/21 1600  metroNIDAZOLE (FLAGYL) IVPB 500 mg       "And" Linked Group Details   500 mg 100 mL/hr over 60 Minutes Intravenous Every 8 hours 02/13/21 1436     02/13/21 0600  vancomycin (VANCOREADY) IVPB 1000 mg/200 mL  Status:  Discontinued        1,000 mg 200 mL/hr over 60 Minutes Intravenous Every 12 hours 02/13/21 0134 02/13/21 0835   02/13/21 0100  ceFEPIme (MAXIPIME) 1 g in sodium chloride 0.9 % 100 mL IVPB  Status:  Discontinued        1 g 200 mL/hr over 30 Minutes Intravenous Every 8  hours 02/13/21 0033 02/13/21 1011   02/12/21 1545  vancomycin (VANCOREADY) IVPB 1500 mg/300 mL        1,500 mg 150 mL/hr over 120 Minutes Intravenous  Once 02/12/21 1544 02/12/21 1951   02/12/21  1515  ceFEPIme (MAXIPIME) 2 g in sodium chloride 0.9 % 100 mL IVPB        2 g 200 mL/hr over 30 Minutes Intravenous  Once 02/12/21 1510 02/12/21 1557   02/12/21 1515  metroNIDAZOLE (FLAGYL) IVPB 500 mg        500 mg 100 mL/hr over 60 Minutes Intravenous  Once 02/12/21 1510 02/12/21 1646   02/12/21 1515  vancomycin (VANCOCIN) IVPB 1000 mg/200 mL premix  Status:  Discontinued        1,000 mg 200 mL/hr over 60 Minutes Intravenous  Once 02/12/21 1510 02/12/21 1544      REVIEW OF SYSTEMS:  Const: negative fever, negative chills, negative weight loss Eyes: negative diplopia or visual changes, negative eye pain ENT: negative coryza, negative sore throat Resp: negative cough, hemoptysis, dyspnea Cards: negative for chest pain, palpitations, lower extremity edema GU: negative for frequency, dysuria and hematuria GI: Negative for abdominal pain, diarrhea, bleeding, constipation Skin: negative for rash and pruritus Heme: negative for easy bruising and gum/nose bleeding MS: negative for myalgias, arthralgias, back pain and muscle weakness Neurolo:negative for headaches, dizziness, vertigo, memory problems  Psych: negative for feelings of anxiety, depression  Endocrine: negative for thyroid, diabetes Allergy/Immunology- as above Objective:  VITALS:  BP 105/73 (BP Location: Right Arm)   Pulse 84   Temp 98.3 F (36.8 C) (Oral)   Resp 17   Ht 5\' 10"  (1.778 m)   Wt 81.9 kg   SpO2 100%   BMI 25.91 kg/m  PHYSICAL EXAM:  General: Alert, cooperative, no distress, appears stated age.  Head: Normocephalic, without obvious abnormality, atraumatic. Eyes: Conjunctivae clear, anicteric sclerae. Pupils are equal ENT Nares normal. No drainage or sinus tenderness. Lips, mucosa, and tongue normal. No  Thrush Neck: Supple, symmetrical, no adenopathy, thyroid: non tender no carotid bruit and no JVD. Back: No CVA tenderness. Lungs: Clear to auscultation bilaterally. No Wheezing or Rhonchi. No rales. Heart: Regular rate and rhythm, no murmur, rub or gallop. Abdomen: Soft, non-tender,not distended. Bowel sounds normal. No masses Extremities: rt toe- wound with an abscess- underneath      Lymph: Cervical, supraclavicular normal. Neurologic: Grossly non-focal Pertinent Labs Lab Results CBC    Component Value Date/Time   WBC 6.2 02/13/2021 0716   RBC 4.49 02/13/2021 0716   HGB 12.0 (L) 02/13/2021 0716   HGB 15.6 09/21/2014 1142   HCT 37.8 (L) 02/13/2021 0716   HCT 47.9 09/21/2014 1142   PLT 274 02/13/2021 0716   PLT 281 09/21/2014 1142   MCV 84.2 02/13/2021 0716   MCV 90 09/21/2014 1142   MCH 26.7 02/13/2021 0716   MCHC 31.7 02/13/2021 0716   RDW 16.6 (H) 02/13/2021 0716   RDW 12.6 09/21/2014 1142   LYMPHSABS 1.6 02/12/2021 1457   MONOABS 0.4 02/12/2021 1457   EOSABS 0.4 02/12/2021 1457   BASOSABS 0.1 02/12/2021 1457    CMP Latest Ref Rng & Units 02/13/2021 02/12/2021 10/05/2020  Glucose 70 - 99 mg/dL 194(H) 496(H) 207(H)  BUN 6 - 20 mg/dL 10 11 11   Creatinine 0.61 - 1.24 mg/dL 0.92 1.09 1.07  Sodium 135 - 145 mmol/L 140 134(L) 138  Potassium 3.5 - 5.1 mmol/L 4.1 4.5 4.4  Chloride 98 - 111 mmol/L 110 101 104  CO2 22 - 32 mmol/L 24 25 25   Calcium 8.9 - 10.3 mg/dL 8.2(L) 8.6(L) 8.2(L)  Total Protein 6.5 - 8.1 g/dL 6.0(L) 6.9 -  Total Bilirubin 0.3 - 1.2 mg/dL 0.6 0.4 -  Alkaline Phos 38 -  126 U/L 70 87 -  AST 15 - 41 U/L 12(L) 16 -  ALT 0 - 44 U/L 8 9 -      Microbiology: Recent Results (from the past 240 hour(s))  Blood culture (routine x 2)     Status: None (Preliminary result)   Collection Time: 02/12/21  2:57 PM   Specimen: BLOOD  Result Value Ref Range Status   Specimen Description BLOOD BLOOD RIGHT FOREARM  Final   Special Requests   Final    BOTTLES DRAWN  AEROBIC AND ANAEROBIC Blood Culture adequate volume   Culture   Final    NO GROWTH 2 DAYS Performed at [facility], [address] [place], [place] [address]    Report Status PENDING  Incomplete  Blood culture (routine x 2)     Status: None (Preliminary result)   Collection Time: 02/12/21  3:18 PM   Specimen: BLOOD  Result Value Ref Range Status   Specimen Description BLOOD BLOOD LEFT FOREARM  Final   Special Requests   Final    BOTTLES DRAWN AEROBIC AND ANAEROBIC Blood Culture adequate volume   Culture   Final    NO GROWTH 2 DAYS Performed at [facility], [address] [place], [place] [address]    Report Status PENDING  Incomplete  Resp Panel by RT-PCR (Flu A&B, Covid) Nasopharyngeal Swab     Status: None   Collection Time: 02/12/21  5:55 PM   Specimen: Nasopharyngeal Swab; Nasopharyngeal(NP) swabs in vial transport medium  Result Value Ref Range Status   SARS Coronavirus 2 by RT PCR NEGATIVE NEGATIVE Final    Comment: (NOTE) SARS-CoV-2 target nucleic acids are NOT DETECTED.  The SARS-CoV-2 RNA is generally detectable in upper respiratory specimens during the acute phase of infection. The lowest concentration of SARS-CoV-2 viral copies this assay can detect is 138 copies/mL. A negative result does not preclude SARS-Cov-2 infection and should not be used as the sole basis for treatment or other patient management decisions. A negative result may occur with  improper specimen collection/handling, submission of specimen other than nasopharyngeal swab, presence of viral mutation(s) within the areas targeted by this assay, and inadequate number of viral copies(<138 copies/mL). A negative result must be combined with clinical observations, patient history, and epidemiological information. The expected result is Negative.  Fact Sheet for Patients:  EntrepreneurPulse.com.au  Fact Sheet for Healthcare Providers:   IncredibleEmployment.be  This test is no t yet approved or cleared by the [place] FDA and  has been authorized for detection and/or diagnosis of SARS-CoV-2 by FDA under an Emergency Use Authorization (EUA). This EUA will remain  in effect (meaning this test can be used) for the duration of the COVID-19 declaration under Section 564(b)(1) of the Act, 21 U.S.C.section 360bbb-3(b)(1), unless the authorization is terminated  or revoked sooner.       Influenza A by PCR NEGATIVE NEGATIVE Final   Influenza B by PCR NEGATIVE NEGATIVE Final    Comment: (NOTE) The Xpert Xpress SARS-CoV-2/FLU/RSV plus assay is intended as an aid in the diagnosis of influenza from Nasopharyngeal swab specimens and should not be used as a sole basis for treatment. Nasal washings and aspirates are unacceptable for Xpert Xpress SARS-CoV-2/FLU/RSV testing.  Fact Sheet for Patients: EntrepreneurPulse.com.au  Fact Sheet for Healthcare Providers: IncredibleEmployment.be  This test is not yet approved or cleared by the [place] FDA and has been authorized for detection and/or diagnosis of SARS-CoV-2 by FDA under an Emergency Use Authorization (EUA).  This EUA will remain in effect (meaning this test can be used) for the duration of the COVID-19 declaration under Section 564(b)(1) of the Act, 21 U.S.C. section 360bbb-3(b)(1), unless the authorization is terminated or revoked.  Performed at [facility], [address] [place], [place] [address]     IMAGING RESULTS: MRI reviewed Osteomyelitis of the distal phalanx of the great toe. I have personally reviewed the films ? Impression/Recommendation ? ?Dm with peripheral neuropathy with rt toe infection Pus expressed and sent for culture MRI shows osteo of the distal phalanx Pt is hesitant about amputation Currently on vanco/ceftriaxone + flagyl- continue and adjust according to  culture report  CAD s/p stent Cardiomyopathy  DM  Homeless  Substance use ID will follow him peripherally this weekend   ? ___________________________________________________ Discussed with patient,  Note:  This document was prepared using Dragon voice recognition software and may include unintentional dictation errors.

## 2021-02-14 NOTE — H&P (View-Only) (Signed)
[facility] Vascular Consult Note  MRN : [id number]  Joshua [name] is a 50 y.o. (03-19-1971) male who presents with chief complaint of  Chief Complaint  Patient presents with  . Foot Pain  .  History of Present Illness:   I am asked to evaluate the patient by Dr. Sarajane Jews.  Patient is a 50 year old gentleman who presented to [facility] emergency room 2 days ago with increasing pain and swelling of the right great toe.  He has poorly controlled diabetes.  Evaluation in the ER was consistent with cellulitis and diabetic foot infection on the right.  The patient notes the ulcer has been present for a couple weeks and has not been improving.  It is very painful and has had some drainage.  No specific history of trauma noted by the patient.  The patient denies fever or chills.  the patient does have diabetes which has been difficult to control.  Patient notes prior to the ulcer developing the extremities were painful particularly with ambulation or activity and the discomfort is very consistent day today. Typically, the pain occurs at less than one block, progress is as activity continues to the point that the patient must stop walking. Resting including standing still for several minutes allowed resumption of the activity and the ability to walk a similar distance before stopping again. Uneven terrain and inclined shorten the distance. The pain has been progressive over the past several years.   The patient denies rest pain or dangling of an extremity off the side of the bed during the night for relief. No prior interventions or surgeries.  No history of back problems or DJD of the lumbar sacral spine.   The patient denies amaurosis fugax or recent TIA symptoms. There are no recent neurological changes noted. The patient denies history of DVT, PE or superficial thrombophlebitis. The patient denies recent episodes of angina or shortness of breath.    Current Meds  Medication Sig  . carvedilol (COREG) 3.125 MG tablet Take 1 tablet (3.125 mg total) by mouth 2 (two) times daily with a meal.  . dapagliflozin propanediol (FARXIGA) 5 MG TABS tablet Take 1 tablet (5 mg total) by mouth daily.  . fluticasone (FLONASE) 50 MCG/ACT nasal spray Place 2 sprays into both nostrils daily.  . furosemide (LASIX) 20 MG tablet Take 1 tablet (20 mg total) by mouth daily as needed (for weight gain >3lbs in 1 days and >5lbs in 2 days).  . gabapentin (NEURONTIN) 100 MG capsule Take 1 capsule (100 mg total) by mouth at bedtime.  Marland Kitchen losartan (COZAAR) 25 MG tablet Take 1 tablet (25 mg total) by mouth daily.  . metFORMIN (GLUCOPHAGE) 500 MG tablet Take 1 tablet (500 mg total) by mouth daily with breakfast.  . spironolactone (ALDACTONE) 25 MG tablet Take 1 tablet (25 mg total) by mouth daily.    Past Medical History:  Diagnosis Date  . CHF (congestive heart failure) ([facility])   . Diabetes mellitus without complication ([facility])   . Hypertension     Past Surgical History:  Procedure Laterality Date  . CORONARY/GRAFT ACUTE MI REVASCULARIZATION N/A 09/09/2018   Procedure: Coronary/Graft Acute MI Revascularization;  Surgeon: Yolonda Kida, MD;  Location: [facility];  Service: Cardiovascular;  Laterality: N/A;  . LEFT HEART CATH AND CORONARY ANGIOGRAPHY N/A 09/09/2018   Procedure: LEFT HEART CATH AND CORONARY ANGIOGRAPHY;  Surgeon: Yolonda Kida, MD;  Location: [facility];  Service: Cardiovascular;  Laterality: N/A;  .  none    . STENT PLACEMENT VASCULAR ([place] [facility])      Social History Social History   Tobacco Use  . Smoking status: Current Every Day Smoker  . Smokeless tobacco: Never Used  Vaping Use  . Vaping Use: Never used  Substance Use Topics  . Alcohol use: Yes    Alcohol/week: 0.0 standard drinks  . Drug use: Not Currently    Comment: pt states no found unknown white substance in mouth    Family History No family history of  bleeding/clotting disorders, porphyria or autoimmune disease   Allergies  Allergen Reactions  . Penicillins Other (See Comments)    Patient unsure of allergy     REVIEW OF SYSTEMS (Negative unless checked)  Constitutional: [] Weight loss  [] Fever  [] Chills Cardiac: [] Chest pain   [] Chest pressure   [] Palpitations   [] Shortness of breath when laying flat   [] Shortness of breath at rest   [] Shortness of breath with exertion. Vascular:  [x] Pain in legs with walking   [x] Pain in legs at rest   [] Pain in legs when laying flat   [] Claudication   [] Pain in feet when walking  [] Pain in feet at rest  [] Pain in feet when laying flat   [] History of DVT   [] Phlebitis   [x] Swelling in legs   [] Varicose veins   [] Non-healing ulcers Pulmonary:   [] Uses home oxygen   [] Productive cough   [] Hemoptysis   [] Wheeze  [] COPD   [] Asthma Neurologic:  [] Dizziness  [] Blackouts   [] Seizures   [] History of stroke   [] History of TIA  [] Aphasia   [] Temporary blindness   [] Dysphagia   [] Weakness or numbness in arms   [] Weakness or numbness in legs Musculoskeletal:  [] Arthritis   [] Joint swelling   [] Joint pain   [] Low back pain Hematologic:  [] Easy bruising  [] Easy bleeding   [] Hypercoagulable state   [] Anemic  [] Hepatitis Gastrointestinal:  [] Blood in stool   [] Vomiting blood  [] Gastroesophageal reflux/heartburn   [] Difficulty swallowing. Genitourinary:  [] Chronic kidney disease   [] Difficult urination  [] Frequent urination  [] Burning with urination   [] Blood in urine Skin:  [] Rashes   [] Ulcers   [] Wounds Psychological:  [] History of anxiety   []  History of major depression.    Physical Examination  Vitals:   02/13/21 2150 02/14/21 0001 02/14/21 0452 02/14/21 0851  BP: 116/85 109/85 103/76 124/80  Pulse: 98 98 91 88  Resp: 18 18 18 16   Temp: 98.7 F (37.1 C) 98.4 F (36.9 C) 98.3 F (36.8 C) 98.7 F (37.1 C)  TempSrc: Oral Oral    SpO2: 95% 100% 97% 98%  Weight:      Height:       Body mass index is  25.91 kg/m.  Head: [place]/AT, No temporalis wasting. Prominent temp pulse not noted. Ear/Nose/Throat: Nares w/o erythema or drainage, oropharynx w/o obsrtuction, Mallampati score: 3.   Eyes: PER, Sclera nonicteric.  Neck: Supple, no nuchal rigidity.  No bruit or JVD.  Pulmonary:  Breath sounds equal bilaterally, no use of accessory muscles.  Cardiac: RRR, normal S1, S2, no Murmurs, rubs or gallops. Vascular: Right great toe is bandaged.  No cellulitic changes to the midfoot on the right.  The toes demonstrate moderate atrophy and chronic skin changes consistent with ischemia bilaterally.  There is a 1+ DP and PT pulse on the right there is a trace DP pulse on the left posterior tibial pulse on the left is nonpalpable Gastrointestinal: soft, non-tender, non-distended.  Musculoskeletal: Moves all  extremities.  No deformity or atrophy. No edema. Neurologic: CN 2-12 intact. Symmetrical.  Speech is fluent.  Psychiatric: Judgment intact, Mood & affect appropriate for pt's clinical situation. Dermatologic: No rashes + ulcers noted.  + cellulitis and open wounds. Lymph : No Cervical,  or Inguinal lymphadenopathy.      CBC Lab Results  Component Value Date   WBC 6.2 02/13/2021   HGB 12.0 (L) 02/13/2021   HCT 37.8 (L) 02/13/2021   MCV 84.2 02/13/2021   PLT 274 02/13/2021    BMET    Component Value Date/Time   NA 140 02/13/2021 0716   NA 138 09/21/2014 1142   K 4.1 02/13/2021 0716   K 4.1 09/21/2014 1142   CL 110 02/13/2021 0716   CL 103 09/21/2014 1142   CO2 24 02/13/2021 0716   CO2 27 09/21/2014 1142   GLUCOSE 194 (H) 02/13/2021 0716   GLUCOSE 260 (H) 09/21/2014 1142   BUN 10 02/13/2021 0716   BUN 5 (L) 09/21/2014 1142   CREATININE 0.92 02/13/2021 0716   CREATININE 0.96 09/21/2014 1142   CALCIUM 8.2 (L) 02/13/2021 0716   CALCIUM 9.2 09/21/2014 1142   GFRNONAA >60 02/13/2021 0716   GFRNONAA >60 09/21/2014 1142   GFRAA >60 08/02/2020 0501   GFRAA >60 09/21/2014 1142   Estimated  Creatinine Clearance: 100.3 mL/min (by C-G formula based on SCr of 0.92 mg/dL).  COAG Lab Results  Component Value Date   INR 1.1 02/13/2021   INR 1.1 10/04/2020   INR 1.1 10/03/2020    Radiology 1.  Right foot plain films 3 views significant for soft tissue swelling around the great toe no cortical destruction is identified on these films.  2.  MRI right foot is consistent with osteomyelitis of the distal phalanx of the right great toe edematous changes consistent with surrounding cellulitis are also noted no abscess is identified.  3.  ABIs are obtained dated 3/3 /2002 these show normal ABIs with normal waveforms at the ankle however toe tracings are not obtainable.   Assessment/Plan 1.  Right diabetic foot infection with osteomyelitis of the right great toe: The patient has findings that suggest distal small vessel disease.  He is very concerned about losing his great toe and is debating whether he will try medical therapy alone or allow for distal toe amputation with continued antibiotic therapy.  We discussed that optimizing arterial perfusion/blood flow is crucial to wound healing.  The patient wishes to move forward with an angiogram so they will know for sure what the exact status of his circulation is.  I have discussed the risks and benefits as well as the alternative therapies.  Given his current living situation he does not feel he will be able to make it back to the office and so definitive identification of his arterial status is important to him.  We will proceed with angiography later today.  Risks and benefits were reviewed all questions were answered patient agrees to proceed.  2.  Diabetes mellitus: Continue hypoglycemic medications as already ordered, these medications have been reviewed and there are no changes at this time.  Hgb A1C to be monitored as already arranged by primary service.  3.  Coronary artery disease: Continue cardiac and antihypertensive medications  as already ordered and reviewed, no changes at this time.  Continue statin as ordered and reviewed, no changes at this time  Nitrates PRN for chest pain.  4.  Cellulitis right foot: Antibiotics will continue per medical service.  Debridement  versus amputation per the podiatry service.     Hortencia Pilar, MD  02/14/2021 11:21 AM

## 2021-02-14 NOTE — Op Note (Signed)
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  Abdominal aortogram  2.  Bilateral lower extremity distal runoff with third order catheter placement  3.  Star close left common femoral   Anesthesia: Conscious sedation was administered by the interventional radiology RN under my direct supervision. IV Versed plus fentanyl were utilized. Continuous ECG, pulse oximetry and blood pressure was monitored throughout the entire procedure.  Conscious sedation was administered for a total of 23 minutes 5 seconds.  Sheath: 5 French Pinnacle sheath retrograde left common femoral  Contrast: 55 cc   Fluoroscopy Time: 2.3 minutes  Indications:  The patient presents to [facility] with ulceration and osteomyelitis of the right great toe and chronic changes to the forefoot bilaterally.  The risks and benefits as well as alternative therapies for lower extremity revascularization are reviewed with the patient all questions are answered the patient agrees to proceed.  The patient is therefore undergoing angiography with the hope for intervention for limb salvage.   Procedure:  Joshua [name] a 50 y.o. male who was identified and appropriate procedural time out was performed.  The patient was then placed supine on the table and prepped and draped in the usual sterile fashion.  Ultrasound was used to evaluate the left common femoral artery.  It was echolucent and pulsatile indicating it is patent .  An ultrasound image was acquired for the permanent record.  A micropuncture needle was used to access the left common femoral artery under direct ultrasound guidance.  The microwire was then  advanced under fluoroscopic guidance without difficulty followed by the micro-sheath.  A 0.035 J wire was advanced without resistance and a 5Fr sheath was placed.    Pigtail catheter was then advanced to the level of T12 and AP projection of the aorta was obtained. Pigtail catheter was then repositioned to above the bifurcation and LAO view of the pelvis was obtained. Stiff angled Glidewire and pigtail catheter was then used across the bifurcation and the catheter was positioned in the distal external iliac artery.  RAO of the right groin was then obtained. Wire was reintroduced and negotiated into the SFA and the catheter was advanced into the SFA. Distal runoff was then performed.  After review of the images the catheter was removed over wire and imaging of the left lower extremity remedy was obtained by hand-injection through the sheath.  After review these images an LAO view of the groin was obtained. StarClose device was deployed without difficulty.   Findings:   Aortogram: Abdominal aorta is passed by the bolus injection contrast.  There is no evidence of calcification.  There are no hemodynamically significant stenoses.  Single renal arteries are noted no evidence of renal artery stenosis.  The aortic bifurcation bilateral common internal and external iliac arteries are all widely patent.  Right Lower Extremity: The right common femoral profunda femoris superficial femoral and popliteal arteries are widely patent.  There is minimal calcific changes.  The trifurcation is patent.  As we progressed more distally there is mild calcific changes.  The anterior tibial and posterior tibial are both patent down to the foot with the dorsalis pedis and medial and lateral plantar arteries being filled respectively.  There is communication of the plantar arteries and the  dorsalis pedis with the pedal arch and digital arteries are identified.  There are mild atherosclerotic changes of the forefoot.  Patient should  have adequate perfusion for wound healing.  Left Lower Extremity:  The right common femoral profunda femoris superficial femoral and popliteal arteries are widely patent.  There is minimal calcific changes.  The trifurcation is patent.  As we progressed more distally there is mild calcific changes.  The anterior tibial and posterior tibial are patent down to the foot and the anterior tibial crosses the foot filling the dorsalis pedis which fills some of the digital arteries.  There is moderate disease of the forefoot.  The posterior tibial artery becomes quite small as it nears the ankle and the plantar arteries are atretic they do not communicate with the pedal arch and demonstrate moderate to severe occlusive disease.  Disposition: Patient was taken to the recovery room in stable condition having tolerated the procedure well.  Belenda Cruise Schnier 02/14/2021,3:45 PM

## 2021-02-14 NOTE — Progress Notes (Signed)
Pharmacy Antibiotic Note  Joshua [name] is a 50 y.o. male admitted on 02/12/2021 with diabetic foot infection/cellulitis of great toe on right foot.  Patient has PMH of DM, HTN, peripheral polyneuropathy, and CHF. Pharmacy has been consulted for Vancomycin and Cefepime dosing.  Plan: Cefepime 2gm IV q8hrs - discontinued 3/3  Continue Vancomycin dose to 1250mg  IV q12hrs Goal AUC 400-550. Expected AUC: 486 SCr used: 0.92 If continues on Vancomycin will need to check peak/trough levels  Height: 5\' 10"  (177.8 cm) Weight: 81.9 kg (180 lb 8.9 oz) IBW/kg (Calculated) : 73  Temp (24hrs), Avg:98.4 F (36.9 C), Min:97.8 F (36.6 C), Max:98.7 F (37.1 C)  Recent Labs  Lab 02/12/21 1457 02/12/21 2208 02/13/21 0716  WBC 7.0  --  6.2  CREATININE 1.09  --  0.92  LATICACIDVEN 2.3* 1.3  --     Estimated Creatinine Clearance: 100.3 mL/min (by C-G formula based on SCr of 0.92 mg/dL).    Allergies  Allergen Reactions  . Penicillins Other (See Comments)    Patient unsure of allergy   Antimicrobials this admission: Vancomycin 3/2 >>  Cefepime  3/2 >> 3/3 Ceftriaxone 3/3 >> Metronidazole 3/3 >>   Microbiology results:  BCx: pending   Thank you for allowing pharmacy to be a part of this patient's care.  Paulina Fusi, PharmD, BCPS 02/14/2021 2:05 PM

## 2021-02-14 NOTE — Plan of Care (Signed)

## 2021-02-14 NOTE — Progress Notes (Addendum)
Daily Progress Note   Subjective  - * No surgery date entered *  Follow-up right great toe ulceration.  MRI performed consistent with osteomyelitis of the distal phalanx of the great toe.  Objective Vitals:   02/13/21 2150 02/14/21 0001 02/14/21 0452 02/14/21 0851  BP: 116/85 109/85 103/76 124/80  Pulse: 98 98 91 88  Resp: 18 18 18 16   Temp: 98.7 F (37.1 C) 98.4 F (36.9 C) 98.3 F (36.8 C) 98.7 F (37.1 C)  TempSrc: Oral Oral    SpO2: 95% 100% 97% 98%  Weight:      Height:        Physical Exam: Right great toe ulceration on the distal aspect.  Diffuse edema to the right great toe.  MRI:\ IMPRESSION: 1. 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 2. Diffuse subcutaneous edema and enhancement in the great toe compatible with cellulitis. No drainable abscess identified. 3. Low-level edema in the plantar musculature of the foot, probably neurogenic, less likely to be from low-grade myositis. 4. Non-fragmented osteochondral lesions of the distal first metatarsal head and medially along the head of the proximal phalanx great toe. 5. Faintly accentuated T2 signal in the distal phalanges of the second and third toes, but without corresponding reduced T1 signal, not considered specific for osteomyelitis and probably incidental.  Laboratory CBC    Component Value Date/Time   WBC 6.2 02/13/2021 0716   HGB 12.0 (L) 02/13/2021 0716   HGB 15.6 09/21/2014 1142   HCT 37.8 (L) 02/13/2021 0716   HCT 47.9 09/21/2014 1142   PLT 274 02/13/2021 0716   PLT 281 09/21/2014 1142    BMET    Component Value Date/Time   NA 140 02/13/2021 0716   NA 138 09/21/2014 1142   K 4.1 02/13/2021 0716   K 4.1 09/21/2014 1142   CL 110 02/13/2021 0716   CL 103 09/21/2014 1142   CO2 24 02/13/2021 0716   CO2 27 09/21/2014 1142   GLUCOSE 194 (H) 02/13/2021 0716   GLUCOSE 260 (H) 09/21/2014 1142   BUN 10 02/13/2021 0716   BUN 5 (L) 09/21/2014 1142   CREATININE 0.92 02/13/2021 0716    CREATININE 0.96 09/21/2014 1142   CALCIUM 8.2 (L) 02/13/2021 0716   CALCIUM 9.2 09/21/2014 1142   GFRNONAA >60 02/13/2021 0716   GFRNONAA >60 09/21/2014 1142   GFRAA >60 08/02/2020 0501   GFRAA >60 09/21/2014 1142    Assessment/Planning: Diabetic ulcer right great toe osteomyelitis confirmed on MRI   I discussed with the patient the MRI findings.  They are consistent with osteomyelitis on the distal phalanx of the right great toe.  My recommendation is amputation of the distal phalanx of the great toe and primary closure of the wound.  I discussed the postoperative course with the patient in great detail.  He is very hesitant at this time and has not decided on surgical intervention at this point.  He is currently n.p.o. he had breakfast at 8 AM.  We will keep him n.p.o. for now.  I discussed if he chooses to defer surgery he would likely need long-term antibiotics and wound care.  We will follow-up later this morning  Elesa Hacker  02/14/2021, 9:58 AM   Addendum:  Damaris Schooner with vascular and they will perform angio today. Pt can make decision on amputation afterward.

## 2021-02-14 NOTE — Consult Note (Signed)
[facility] Vascular Consult Note  MRN : [id number]  Joshua [name] is a 50 y.o. ([birth year]) male who presents with chief complaint of  Chief Complaint  Patient presents with  . Foot Pain  .  History of Present Illness:   I am asked to evaluate the patient by Dr. Sarajane Jews.  Patient is a 50 year old gentleman who presented to [facility] emergency room 2 days ago with increasing pain and swelling of the right great toe.  He has poorly controlled diabetes.  Evaluation in the ER was consistent with cellulitis and diabetic foot infection on the right.  The patient notes the ulcer has been present for a couple weeks and has not been improving.  It is very painful and has had some drainage.  No specific history of trauma noted by the patient.  The patient denies fever or chills.  the patient does have diabetes which has been difficult to control.  Patient notes prior to the ulcer developing the extremities were painful particularly with ambulation or activity and the discomfort is very consistent day today. Typically, the pain occurs at less than one block, progress is as activity continues to the point that the patient must stop walking. Resting including standing still for several minutes allowed resumption of the activity and the ability to walk a similar distance before stopping again. Uneven terrain and inclined shorten the distance. The pain has been progressive over the past several years.   The patient denies rest pain or dangling of an extremity off the side of the bed during the night for relief. No prior interventions or surgeries.  No history of back problems or DJD of the lumbar sacral spine.   The patient denies amaurosis fugax or recent TIA symptoms. There are no recent neurological changes noted. The patient denies history of DVT, PE or superficial thrombophlebitis. The patient denies recent episodes of angina or shortness of breath.    Current Meds  Medication Sig  . carvedilol (COREG) 3.125 MG tablet Take 1 tablet (3.125 mg total) by mouth 2 (two) times daily with a meal.  . dapagliflozin propanediol (FARXIGA) 5 MG TABS tablet Take 1 tablet (5 mg total) by mouth daily.  . fluticasone (FLONASE) 50 MCG/ACT nasal spray Place 2 sprays into both nostrils daily.  . furosemide (LASIX) 20 MG tablet Take 1 tablet (20 mg total) by mouth daily as needed (for weight gain >3lbs in 1 days and >5lbs in 2 days).  . gabapentin (NEURONTIN) 100 MG capsule Take 1 capsule (100 mg total) by mouth at bedtime.  Marland Kitchen losartan (COZAAR) 25 MG tablet Take 1 tablet (25 mg total) by mouth daily.  . metFORMIN (GLUCOPHAGE) 500 MG tablet Take 1 tablet (500 mg total) by mouth daily with breakfast.  . spironolactone (ALDACTONE) 25 MG tablet Take 1 tablet (25 mg total) by mouth daily.    Past Medical History:  Diagnosis Date  . CHF (congestive heart failure) ([place])   . Diabetes mellitus without complication ([place])   . Hypertension     Past Surgical History:  Procedure Laterality Date  . CORONARY/GRAFT ACUTE MI REVASCULARIZATION N/A 09/09/2018   Procedure: Coronary/Graft Acute MI Revascularization;  Surgeon: Yolonda Kida, MD;  Location: [facility];  Service: Cardiovascular;  Laterality: N/A;  . LEFT HEART CATH AND CORONARY ANGIOGRAPHY N/A 09/09/2018   Procedure: LEFT HEART CATH AND CORONARY ANGIOGRAPHY;  Surgeon: Yolonda Kida, MD;  Location: [facility];  Service: Cardiovascular;  Laterality: N/A;  .  none    . STENT PLACEMENT VASCULAR ([place] HX)      Social History Social History   Tobacco Use  . Smoking status: Current Every Day Smoker  . Smokeless tobacco: Never Used  Vaping Use  . Vaping Use: Never used  Substance Use Topics  . Alcohol use: Yes    Alcohol/week: 0.0 standard drinks  . Drug use: Not Currently    Comment: pt states no found unknown white substance in mouth    Family History No family history of  bleeding/clotting disorders, porphyria or autoimmune disease   Allergies  Allergen Reactions  . Penicillins Other (See Comments)    Patient unsure of allergy     REVIEW OF SYSTEMS (Negative unless checked)  Constitutional: [] Weight loss  [] Fever  [] Chills Cardiac: [] Chest pain   [] Chest pressure   [] Palpitations   [] Shortness of breath when laying flat   [] Shortness of breath at rest   [] Shortness of breath with exertion. Vascular:  [x] Pain in legs with walking   [x] Pain in legs at rest   [] Pain in legs when laying flat   [] Claudication   [] Pain in feet when walking  [] Pain in feet at rest  [] Pain in feet when laying flat   [] History of DVT   [] Phlebitis   [x] Swelling in legs   [] Varicose veins   [] Non-healing ulcers Pulmonary:   [] Uses home oxygen   [] Productive cough   [] Hemoptysis   [] Wheeze  [] COPD   [] Asthma Neurologic:  [] Dizziness  [] Blackouts   [] Seizures   [] History of stroke   [] History of TIA  [] Aphasia   [] Temporary blindness   [] Dysphagia   [] Weakness or numbness in arms   [] Weakness or numbness in legs Musculoskeletal:  [] Arthritis   [] Joint swelling   [] Joint pain   [] Low back pain Hematologic:  [] Easy bruising  [] Easy bleeding   [] Hypercoagulable state   [] Anemic  [] Hepatitis Gastrointestinal:  [] Blood in stool   [] Vomiting blood  [] Gastroesophageal reflux/heartburn   [] Difficulty swallowing. Genitourinary:  [] Chronic kidney disease   [] Difficult urination  [] Frequent urination  [] Burning with urination   [] Blood in urine Skin:  [] Rashes   [] Ulcers   [] Wounds Psychological:  [] History of anxiety   []  History of major depression.    Physical Examination  Vitals:   02/13/21 2150 02/14/21 0001 02/14/21 0452 02/14/21 0851  BP: 116/85 109/85 103/76 124/80  Pulse: 98 98 91 88  Resp: 18 18 18 16   Temp: 98.7 F (37.1 C) 98.4 F (36.9 C) 98.3 F (36.8 C) 98.7 F (37.1 C)  TempSrc: Oral Oral    SpO2: 95% 100% 97% 98%  Weight:      Height:       Body mass index is  25.91 kg/m.  Head: [place]/AT, No temporalis wasting. Prominent temp pulse not noted. Ear/Nose/Throat: Nares w/o erythema or drainage, oropharynx w/o obsrtuction, Mallampati score: 3.   Eyes: PER, Sclera nonicteric.  Neck: Supple, no nuchal rigidity.  No bruit or JVD.  Pulmonary:  Breath sounds equal bilaterally, no use of accessory muscles.  Cardiac: RRR, normal S1, S2, no Murmurs, rubs or gallops. Vascular: Right great toe is bandaged.  No cellulitic changes to the midfoot on the right.  The toes demonstrate moderate atrophy and chronic skin changes consistent with ischemia bilaterally.  There is a 1+ DP and PT pulse on the right there is a trace DP pulse on the left posterior tibial pulse on the left is nonpalpable Gastrointestinal: soft, non-tender, non-distended.  Musculoskeletal: Moves all  extremities.  No deformity or atrophy. No edema. Neurologic: CN 2-12 intact. Symmetrical.  Speech is fluent.  Psychiatric: Judgment intact, Mood & affect appropriate for pt's clinical situation. Dermatologic: No rashes + ulcers noted.  + cellulitis and open wounds. Lymph : No Cervical,  or Inguinal lymphadenopathy.      CBC Lab Results  Component Value Date   WBC 6.2 02/13/2021   HGB 12.0 (L) 02/13/2021   HCT 37.8 (L) 02/13/2021   MCV 84.2 02/13/2021   PLT 274 02/13/2021    BMET    Component Value Date/Time   NA 140 02/13/2021 0716   NA 138 09/21/2014 1142   K 4.1 02/13/2021 0716   K 4.1 09/21/2014 1142   CL 110 02/13/2021 0716   CL 103 09/21/2014 1142   CO2 24 02/13/2021 0716   CO2 27 09/21/2014 1142   GLUCOSE 194 (H) 02/13/2021 0716   GLUCOSE 260 (H) 09/21/2014 1142   BUN 10 02/13/2021 0716   BUN 5 (L) 09/21/2014 1142   CREATININE 0.92 02/13/2021 0716   CREATININE 0.96 09/21/2014 1142   CALCIUM 8.2 (L) 02/13/2021 0716   CALCIUM 9.2 09/21/2014 1142   GFRNONAA >60 02/13/2021 0716   GFRNONAA >60 09/21/2014 1142   GFRAA >60 08/02/2020 0501   GFRAA >60 09/21/2014 1142   Estimated  Creatinine Clearance: 100.3 mL/min (by C-G formula based on SCr of 0.92 mg/dL).  COAG Lab Results  Component Value Date   INR 1.1 02/13/2021   INR 1.1 10/04/2020   INR 1.1 10/03/2020    Radiology 1.  Right foot plain films 3 views significant for soft tissue swelling around the great toe no cortical destruction is identified on these films.  2.  MRI right foot is consistent with osteomyelitis of the distal phalanx of the right great toe edematous changes consistent with surrounding cellulitis are also noted no abscess is identified.  3.  ABIs are obtained dated 3/3 /2002 these show normal ABIs with normal waveforms at the ankle however toe tracings are not obtainable.   Assessment/Plan 1.  Right diabetic foot infection with osteomyelitis of the right great toe: The patient has findings that suggest distal small vessel disease.  He is very concerned about losing his great toe and is debating whether he will try medical therapy alone or allow for distal toe amputation with continued antibiotic therapy.  We discussed that optimizing arterial perfusion/blood flow is crucial to wound healing.  The patient wishes to move forward with an angiogram so they will know for sure what the exact status of his circulation is.  I have discussed the risks and benefits as well as the alternative therapies.  Given his current living situation he does not feel he will be able to make it back to the office and so definitive identification of his arterial status is important to him.  We will proceed with angiography later today.  Risks and benefits were reviewed all questions were answered patient agrees to proceed.  2.  Diabetes mellitus: Continue hypoglycemic medications as already ordered, these medications have been reviewed and there are no changes at this time.  Hgb A1C to be monitored as already arranged by primary service.  3.  Coronary artery disease: Continue cardiac and antihypertensive medications  as already ordered and reviewed, no changes at this time.  Continue statin as ordered and reviewed, no changes at this time  Nitrates PRN for chest pain.  4.  Cellulitis right foot: Antibiotics will continue per medical service.  Debridement  versus amputation per the podiatry service.     Hortencia Pilar, MD  02/14/2021 11:21 AM

## 2021-02-14 NOTE — Progress Notes (Signed)
PROGRESS NOTE  Joshua [name] GHW:[id number] DOB: 01-18-1971 DOA: 02/12/2021 PCP: Patient, No Pcp Per  Brief History   49yom w/ uncontrolled DM, CHF presented w/ bilateral foot pain. Admitted for right great toe diabetic foot infection. Seen by vascular surgery and podiatry. Pt contemplated recommended surgery.  A & P  Right great toe diabetic foot infection with osteomyelitis by MRI. --Clinically stable.  Continue antibiotics.  Podiatry recommends surgery.  Had angiogram today which showed adequate blood flow to the foot, felt to be able to heal from surgery. --Continue antibiotics.  Await decision on surgery. --Infectious disease consulted  DM type 2 w/ peripheral polyneuropathy uncontrolled w/ HgbA1c 11.9 --CBG better today.  Will increase Lantus however for high AM blood sugar.  Chronic systolic, diastolic CHF, CAD s/p PCI w/ stent x2 08/2018 --Appears compensated  Cocaine positive, suspect abuse --avoid beta-blocker for now --[facility] consult  Smoker  --recommend cessation  Homeless --TOC consult. Needs PCP, housing and medication assistance.  Disposition Plan:  Discussion: Continue antibiotics.  Await further recommendations from podiatry, await patient's decision in regard to pursuing surgery or not.  Follow-up infectious disease recommendations.  Monitor blood sugars.  Status is: Inpatient  Remains inpatient appropriate because:IV treatments appropriate due to intensity of illness or inability to take PO and Inpatient level of care appropriate due to severity of illness   Dispo: The patient is from: Home              Anticipated d/c is to: Home              Patient currently is not medically stable to d/c.   Difficult to place patient No  DVT prophylaxis: SCDs Start: 02/12/21 2312   Code Status: Full Code Level of care: Med-Surg Family Communication: none  Murray Hodgkins, MD  [facility] Direct contact: see [email or web address] (further directions at bottom of  note if needed) 7PM-7AM contact night coverage as at bottom of note 02/14/2021, 5:52 PM  LOS: 2 days   Significant Hospital Events   .    Consults:  .    Procedures:  .   Significant Diagnostic Tests:  Marland Kitchen    Micro Data:  .    Antimicrobials:  .   Interval History/Subjective  CC: f/u DM toe infection  Feels ok, no pain, no complaints Not sure what to do about his foot  Objective   Vitals:  Vitals:   02/14/21 1600 02/14/21 1629  BP: 109/77 (!) 117/94  Pulse: 75 80  Resp: 19 17  Temp:  (!) 97.4 F (36.3 C)  SpO2: 96% 97%    Exam:  Constitutional:   . Appears calm and comfortable ENMT:  . grossly normal hearing  Respiratory:  . CTA bilaterally, no w/r/r.  . Respiratory effort normal.  Cardiovascular:  . RRR, no m/r/g . No LE extremity edema   Psychiatric:  . Mental status o Mood, affect appropriate  I have personally reviewed the following:   Today's Data  . CBG high this AM, better throughout day . CMP unremarkable . CBC stable  Scheduled Meds: . vitamin C  250 mg Oral BID  . carvedilol  3.125 mg Oral BID WC  . fentaNYL      . fluticasone  2 spray Each Nare Daily  . gabapentin  100 mg Oral QHS  . insulin aspart  0-15 Units Subcutaneous TID WC  . insulin aspart  0-5 Units Subcutaneous QHS  . insulin aspart  3 Units Subcutaneous TID WC  .  insulin glargine  16 Units Subcutaneous QHS  . losartan  25 mg Oral Daily  . midazolam      . multivitamin with minerals  1 tablet Oral Daily  . Ensure Max Protein  11 oz Oral BID  . sodium chloride flush  3 mL Intravenous Q12H   Continuous Infusions: . sodium chloride 100 mL/hr at 02/14/21 1712  . sodium chloride    . cefTRIAXone (ROCEPHIN)  IV Stopped (02/13/21 2155)   And  . metronidazole 500 mg (02/14/21 0859)  . vancomycin 1,250 mg (02/14/21 1713)    Principal Problem:   Cellulitis of great toe of right foot Active Problems:   Type 2 diabetes mellitus without complication (HCC)   Diabetic foot  ulcer (HCC)   Lactic acidosis   Acute metabolic encephalopathy   Chronic combined systolic (congestive) and diastolic (congestive) heart failure (HCC)   Cocaine abuse ([place])   Homeless   Malnutrition of moderate degree   LOS: 2 days   How to contact the [facility] Attending or Consulting provider [place] or covering provider during after hours [place], for this patient?  1. Check the care team in [facility] and look for a) attending/consulting TRH provider listed and b) the [facility] team listed 2. Log into [email or web address] and use [place]'s universal password to access. If you do not have the password, please contact the hospital operator. 3. Locate the [facility] provider you are looking for under [facility] and page to a number that you can be directly reached. 4. If you still have difficulty reaching the provider, please page the [facility] (Director on Call) for the Hospitalists listed on amion for assistance.

## 2021-02-15 DIAGNOSIS — Z794 Long term (current) use of insulin: Secondary | ICD-10-CM

## 2021-02-15 DIAGNOSIS — L97509 Non-pressure chronic ulcer of other part of unspecified foot with unspecified severity: Secondary | ICD-10-CM

## 2021-02-15 LAB — BASIC METABOLIC PANEL
Anion gap: 5 (ref 5–15)
BUN: 22 mg/dL — ABNORMAL HIGH (ref 6–20)
CO2: 25 mmol/L (ref 22–32)
Calcium: 8.5 mg/dL — ABNORMAL LOW (ref 8.9–10.3)
Chloride: 105 mmol/L (ref 98–111)
Creatinine, Ser: 0.92 mg/dL (ref 0.61–1.24)
GFR, Estimated: >60 mL/min (ref >60)
Glucose, Bld: 389 mg/dL — ABNORMAL HIGH (ref 70–99)
Potassium: 4.6 mmol/L (ref 3.5–5.1)
Sodium: 135 mmol/L (ref 135–145)

## 2021-02-15 LAB — CBC
HCT: 38.5 % — ABNORMAL LOW (ref 39.0–52.0)
Hemoglobin: 11.7 g/dL — ABNORMAL LOW (ref 13.0–17.0)
MCH: 25.8 pg — ABNORMAL LOW (ref 26.0–34.0)
MCHC: 30.4 g/dL (ref 30.0–36.0)
MCV: 84.8 fL (ref 80.0–100.0)
Platelets: 279 10*3/uL (ref 150–400)
RBC: 4.54 MIL/uL (ref 4.22–5.81)
RDW: 16.5 % — ABNORMAL HIGH (ref 11.5–15.5)
WBC: 7.1 10*3/uL (ref 4.0–10.5)
nRBC: 0 % (ref 0.0–0.2)

## 2021-02-15 LAB — GLUCOSE, CAPILLARY
Glucose-Capillary: 338 mg/dL — ABNORMAL HIGH (ref 70–99)
Glucose-Capillary: 278 mg/dL — ABNORMAL HIGH (ref 70–99)
Glucose-Capillary: 194 mg/dL — ABNORMAL HIGH (ref 70–99)
Glucose-Capillary: 327 mg/dL — ABNORMAL HIGH (ref 70–99)

## 2021-02-15 MED ORDER — INSULIN GLARGINE 100 UNIT/ML ~~LOC~~ SOLN
20.0000 [IU] | Freq: Every day | SUBCUTANEOUS | Status: DC
  Administered 2021-02-15: 20 [IU] via SUBCUTANEOUS
  Filled 2021-02-15 (×3): qty 0.2

## 2021-02-15 NOTE — Progress Notes (Signed)
PROGRESS NOTE  Joshua [name] [id number]:[id number] DOB: 1971-01-12 DOA: 02/12/2021 PCP: Patient, No Pcp Per  Brief History   49yom w/ uncontrolled DM, CHF presented w/ bilateral foot pain. Admitted for right great toe diabetic foot infection. Seen by vascular surgery and podiatry. Pt contemplated recommended surgery.  A & P  Right great toe diabetic foot infection with osteomyelitis by MRI. --Clinically stable.  Continue antibiotics.  Awaiting culture data.  Patient has elected to defer surgery to the outpatient setting at this point.  Angiogram did show preserved blood flow to the right lower extremity.  DM type 2 w/ peripheral polyneuropathy uncontrolled w/ HgbA1c 11.9 --CBG high and labile.  Increase Lantus.  Continue sliding scale insulin.  Chronic systolic, diastolic CHF, CAD s/p PCI w/ stent x2 08/2018 --No significant lower extremity edema  Cocaine positive, suspect abuse --avoid beta-blocker for now --[facility] consult placed  Smoker  --recommend cessation  Homeless --TOC consult. Needs PCP, housing and medication assistance.  Disposition Plan:  Discussion: Continue antibiotics, plan discharge home Monday if he does not pursue surgery.  Difficult case as patient is homeless.  Will coordinate with TOC.  Status is: Inpatient  Remains inpatient appropriate because:IV treatments appropriate due to intensity of illness or inability to take PO and Inpatient level of care appropriate due to severity of illness   Dispo: The patient is from: Home              Anticipated d/c is to: Home              Patient currently is not medically stable to d/c.   Difficult to place patient No  DVT prophylaxis: SCDs Start: 02/12/21 2312   Code Status: Full Code Level of care: Med-Surg Family Communication: none  Murray Hodgkins, MD  [facility] Direct contact: see [email or web address] (further directions at bottom of note if needed) 7PM-7AM contact night coverage as at bottom of  note 02/15/2021, 3:19 PM  LOS: 3 days   Significant Hospital Events   .    Consults:  . Podiatry . Vascular surgery . ID   Procedures:  .   Significant Diagnostic Tests:  Marland Kitchen    Micro Data:  .    Antimicrobials:  .   Interval History/Subjective  CC: f/u DM toe infection  Feels ok Still thinking about surgery, plans to follow-up as an outpatient Has no place to live yet Asking about disability  Objective   Vitals:  Vitals:   02/15/21 0753 02/15/21 1146  BP: 111/68 (!) 129/98  Pulse: 84 (!) 105  Resp: 18 18  Temp: (!) 97.5 F (36.4 C) 98 F (36.7 C)  SpO2: 100% 99%    Exam: Constitutional:   . Appears calm and comfortable ENMT:  . grossly normal hearing  Respiratory:  . CTA bilaterally, no w/r/r.  . Respiratory effort normal.  Cardiovascular:  . RRR, no m/r/g . No LE extremity edema   Psychiatric:  . Mental status o Mood, affect appropriate  Right great toe appears better, no exudate from wound  I have personally reviewed the following:   Today's Data  . AM CBG 338 . CBG labile . BMP and CBC stable  Scheduled Meds: . vitamin C  250 mg Oral BID  . carvedilol  3.125 mg Oral BID WC  . fluticasone  2 spray Each Nare Daily  . gabapentin  100 mg Oral QHS  . insulin aspart  0-15 Units Subcutaneous TID WC  . insulin aspart  0-5 Units Subcutaneous QHS  .  insulin aspart  3 Units Subcutaneous TID WC  . insulin glargine  20 Units Subcutaneous QHS  . losartan  25 mg Oral Daily  . multivitamin with minerals  1 tablet Oral Daily  . Ensure Max Protein  11 oz Oral BID  . sodium chloride flush  3 mL Intravenous Q12H   Continuous Infusions: . sodium chloride 10 mL/hr at 02/15/21 0042  . cefTRIAXone (ROCEPHIN)  IV Stopped (02/13/21 2155)   And  . metronidazole 500 mg (02/15/21 1239)  . vancomycin 1,250 mg (02/15/21 9563)    Principal Problem:   Cellulitis of great toe of right foot Active Problems:   Type 2 diabetes mellitus without complication  (HCC)   Diabetic foot ulcer (HCC)   Lactic acidosis   Acute metabolic encephalopathy   Chronic combined systolic (congestive) and diastolic (congestive) heart failure (HCC)   Cocaine abuse ([place])   Homeless   Malnutrition of moderate degree   LOS: 3 days   How to contact the [facility] Attending or Consulting provider [place] or covering provider during after hours [place], for this patient?  1. Check the care team in [facility] and look for a) attending/consulting TRH provider listed and b) the [facility] team listed 2. Log into [email or web address] and use [place] [place] universal password to access. If you do not have the password, please contact the hospital operator. 3. Locate the [facility] provider you are looking for under [facility] and page to a number that you can be directly reached. 4. If you still have difficulty reaching the provider, please page the [facility] (Director on Call) for the Hospitalists listed on amion for assistance.

## 2021-02-15 NOTE — Progress Notes (Signed)
Follow-up for right great toe osteomyelitis.  Underwent angiogram with good blood flow to the right side.  Somewhat less blood flow to the left side.  Patient I had a very long discussion in regards to surgical intervention.  Once again I outlined my recommendation for surgical intervention with distal toe amputation.  I discussed that given his blood flow he has a good chance of having a excellent outcome.    Once again the patient is very hesitant to undergo any surgical intervention at this time.  His biggest concern is his home situation.  He states he really has no home situation to return to.  He at this time is requesting to delay surgery until he can get a stable home situation figured out at least in the immediate postoperative recovery phase.  He is requesting to be discharged on oral antibiotics and can follow-up in the outpatient clinic with me.  We can plan surgery at that time.  I discussed my concern with follow-up and the strong need to the follow-up so we can continue with his current plan.  He expressed strong interest in doing this.  He states he has a brother that may be able to take a man after surgery.  He also expressed his desire to try to get his blood sugars under more control in the near term as well.  I reinforced the need to follow-up with his primary care provider upon discharge.  He states he just needs time to get this situation figured out.  Currently there is no severe drainage.  He is not septic.  His toe is edematous.  There is no exposed bone.  I do believe he would be safe as long as he can maintain his follow-up and continue with the current plan which he expressed understanding.  Patient should follow-up with me in the outpatient clinic in about a week.  Can be discharged on broad-spectrum oral antibiotics once medically stable.

## 2021-02-16 LAB — GLUCOSE, CAPILLARY
Glucose-Capillary: 241 mg/dL — ABNORMAL HIGH (ref 70–99)
Glucose-Capillary: 183 mg/dL — ABNORMAL HIGH (ref 70–99)
Glucose-Capillary: 333 mg/dL — ABNORMAL HIGH (ref 70–99)
Glucose-Capillary: 402 mg/dL — ABNORMAL HIGH (ref 70–99)

## 2021-02-16 LAB — CREATININE, SERUM
Creatinine, Ser: 0.82 mg/dL (ref 0.61–1.24)
GFR, Estimated: >60 mL/min (ref >60)

## 2021-02-16 MED ORDER — INSULIN GLARGINE 100 UNIT/ML ~~LOC~~ SOLN
24.0000 [IU] | Freq: Every day | SUBCUTANEOUS | Status: DC
  Administered 2021-02-16: 24 [IU] via SUBCUTANEOUS
  Filled 2021-02-16 (×2): qty 0.24

## 2021-02-16 NOTE — Plan of Care (Signed)

## 2021-02-16 NOTE — Progress Notes (Signed)
PROGRESS NOTE  Joshua [name] VEH:[id number] DOB: [birth year] DOA: 02/12/2021 PCP: Joshua [name], No Pcp Per  Brief History   49yom w/ uncontrolled DM, CHF presented w/ bilateral foot pain. Admitted for right great toe diabetic foot infection. Seen by vascular surgery and podiatry. Recommendation of distal phalanx amputation, pt considering.  A & P  Right great toe diabetic foot infection with osteomyelitis by MRI. --remains stable. Joshua [name] not sure about surgery. Angiogram did show preserved blood flow to the right lower extremity.  DM type 2 w/ peripheral polyneuropathy uncontrolled w/ HgbA1c 11.9 --CBG remains labile, high. Increase Lantus, continue meal coverage and SSI  Chronic systolic, diastolic CHF, CAD s/p PCI w/ stent x2 08/2018 --No significant lower extremity edema  Cocaine positive, suspect abuse --avoid beta-blocker for now --[facility] consult placed  Smoker  --recommend cessation  Homeless --TOC consult. Needs PCP, housing and medication assistance.    Disposition Plan:  Discussion: Difficult case as Joshua [name] is homeless.  Will coordinate with TOC on 3/7 for discharge if Joshua [name] does not pursue surgery.  Status is: Inpatient  Remains inpatient appropriate because:IV treatments appropriate due to intensity of illness or inability to take PO and Inpatient level of care appropriate due to severity of illness   Dispo: The Joshua [name] is from: Home              Anticipated d/c is to: Home              Joshua [name] currently is not medically stable to d/c.   Difficult to place Joshua [name] No  DVT prophylaxis: SCDs Start: 02/12/21 2312   Code Status: Full Code Level of care: Med-Surg Family Communication: none  Joshua Hodgkins, MD  [facility] Direct contact: see [email or web address] (further directions at bottom of note if needed) 7PM-7AM contact night coverage as at bottom of note 02/16/2021, 1:48 PM  LOS: 4 days   Significant Hospital Events   .    Consults:   . Podiatry . Vascular surgery . ID   Procedures:  .   Significant Diagnostic Tests:  Marland Kitchen    Micro Data:  .    Antimicrobials:  .   Interval History/Subjective  CC: f/u DM toe infection  Feels ok, still thinking about surgery Concerned about what he will do as outpatient to keep foot clean and taken care off. He is seeking disability.  Objective   Vitals:  Vitals:   02/16/21 0802 02/16/21 1144  BP: 114/81 120/81  Pulse: 74 88  Resp: 18 18  Temp: (!) 97.4 F (36.3 C) 97.8 F (36.6 C)  SpO2: 100% 100%    Exam: Constitutional:   . Appears calm and comfortable ENMT:  . grossly normal hearing  Respiratory:  . CTA bilaterally, no w/r/r.  . Respiratory effort normal.  Cardiovascular:  . RRR, no m/r/g . No LE extremity edema   Psychiatric:  . Mental status o Mood, affect appropriate  I have personally reviewed the following:   Today's Data  . UOP 3100 . BM x1 . AM glucose 241, CBG running high  Scheduled Meds: . vitamin C  250 mg Oral BID  . carvedilol  3.125 mg Oral BID WC  . fluticasone  2 spray Each Nare Daily  . gabapentin  100 mg Oral QHS  . insulin aspart  0-15 Units Subcutaneous TID WC  . insulin aspart  0-5 Units Subcutaneous QHS  . insulin aspart  3 Units Subcutaneous TID WC  . insulin glargine  24 Units Subcutaneous QHS  . losartan  25 mg Oral Daily  . multivitamin with minerals  1 tablet Oral Daily  . Ensure Max Protein  11 oz Oral BID  . sodium chloride flush  3 mL Intravenous Q12H   Continuous Infusions: . sodium chloride 10 mL/hr at 02/15/21 0042  . cefTRIAXone (ROCEPHIN)  IV Stopped (02/15/21 1848)   And  . metronidazole 500 mg (02/16/21 0902)  . vancomycin 1,250 mg (02/16/21 0550)    Principal Problem:   Cellulitis of great toe of right foot Active Problems:   Type 2 diabetes mellitus without complication (HCC)   Diabetic foot ulcer (HCC)   Lactic acidosis   Acute metabolic encephalopathy   Chronic combined systolic  (congestive) and diastolic (congestive) heart failure (HCC)   Cocaine abuse ([place])   Homeless   Malnutrition of moderate degree   LOS: 4 days   How to contact the [facility] Attending or Consulting provider [place] or covering provider during after hours [place], for this Joshua [name]?  1. Check the care team in [facility] and look for a) attending/consulting TRH provider listed and b) the [facility] team listed 2. Log into [email or web address] and use [facility] [place] [facility] universal password to access. If you do not have the password, please contact the hospital operator. 3. Locate the [facility] provider you are looking for under [facility] and page to a number that you can be directly reached. 4. If you still have difficulty reaching the provider, please page the [facility] (Director on Call) for the Hospitalists listed on amion for assistance.

## 2021-02-17 ENCOUNTER — Encounter: Payer: Self-pay | Admitting: Vascular Surgery

## 2021-02-17 DIAGNOSIS — L089 Local infection of the skin and subcutaneous tissue, unspecified: Secondary | ICD-10-CM | POA: Diagnosis not present

## 2021-02-17 DIAGNOSIS — E11628 Type 2 diabetes mellitus with other skin complications: Secondary | ICD-10-CM | POA: Diagnosis not present

## 2021-02-17 LAB — CULTURE, BLOOD (ROUTINE X 2)
Culture: NO GROWTH
Culture: NO GROWTH
Special Requests: ADEQUATE
Special Requests: ADEQUATE

## 2021-02-17 LAB — GLUCOSE, CAPILLARY
Glucose-Capillary: 268 mg/dL — ABNORMAL HIGH (ref 70–99)
Glucose-Capillary: 135 mg/dL — ABNORMAL HIGH (ref 70–99)
Glucose-Capillary: 251 mg/dL — ABNORMAL HIGH (ref 70–99)
Glucose-Capillary: 350 mg/dL — ABNORMAL HIGH (ref 70–99)

## 2021-02-17 MED ORDER — INSULIN GLARGINE 100 UNIT/ML ~~LOC~~ SOLN
30.0000 [IU] | Freq: Every day | SUBCUTANEOUS | Status: DC
  Administered 2021-02-17: 30 [IU] via SUBCUTANEOUS
  Filled 2021-02-17 (×2): qty 0.3

## 2021-02-17 MED ORDER — SPIRONOLACTONE 25 MG PO TABS
25.0000 mg | ORAL_TABLET | Freq: Every day | ORAL | Status: DC
  Administered 2021-02-18 – 2021-02-19 (×2): 25 mg via ORAL
  Filled 2021-02-17 (×2): qty 1

## 2021-02-17 MED ORDER — INSULIN ASPART 100 UNIT/ML ~~LOC~~ SOLN
6.0000 [IU] | Freq: Three times a day (TID) | SUBCUTANEOUS | Status: DC
  Administered 2021-02-17 – 2021-02-19 (×6): 6 [IU] via SUBCUTANEOUS
  Filled 2021-02-17 (×6): qty 1

## 2021-02-17 NOTE — Progress Notes (Addendum)
PROGRESS NOTE  Joshua [name] YTK:[id number] DOB: 21-Mar-1971 DOA: 02/12/2021 PCP: Patient, No Pcp Per  Brief History   49yom w/ uncontrolled DM, CHF presented w/ bilateral foot pain. Admitted for right great toe diabetic foot infection. Seen by vascular surgery and podiatry. Recommendation of distal phalanx amputation, but patient prefers to wait despite multiple discussions. Plan release on oral antibiotic when blood sugar better controlled. TOC following for issues with homelessness.  A & P  Right great toe diabetic foot infection with osteomyelitis by MRI. --Angiogram did show preserved blood flow to the right lower extremity. --Patient has declined surgery thus far.  Plan will be to transition to oral antibiotics and discharge home with outpatient follow-up with podiatry.  DM type 2 w/ peripheral polyneuropathy uncontrolled w/ HgbA1c 11.9 --CBG remains labile, high but better this afternoon.  Lantus increased for the night.  Continue sliding scale insulin and meal coverage.  Chronic systolic, diastolic CHF, CAD s/p PCI w/ stent x2 08/2018 --No significant lower extremity edema  Cocaine positive, suspect abuse --can continue carvedilol --TOC consult placed  Smoker  --recommend cessation  Homeless --TOC consult. Needs PCP, housing and medication assistance.  Disposition Plan:  Discussion: Patient prefers to avoid surgery at this time, plan for oral antibiotics and discharge once blood sugars controlled.  Status is: Inpatient  Remains inpatient appropriate because:IV treatments appropriate due to intensity of illness or inability to take PO and Inpatient level of care appropriate due to severity of illness   Dispo: The patient is from: Home              Anticipated d/c is to: Home              Patient currently is not medically stable to d/c.   Difficult to place patient No  DVT prophylaxis: SCDs Start: 02/12/21 2312   Code Status: Full Code Level of care:  Med-Surg Family Communication: none  Murray Hodgkins, MD  [facility] Direct contact: see [email or web address] (further directions at bottom of note if needed) 7PM-7AM contact night coverage as at bottom of note 02/17/2021, 4:28 PM  LOS: 5 days   Significant Hospital Events   .    Consults:  . Podiatry . Vascular surgery . ID   Procedures:  .   Significant Diagnostic Tests:  Marland Kitchen    Micro Data:  .    Antimicrobials:  .   Interval History/Subjective  CC: f/u DM toe infection  Feels ok today  Objective   Vitals:  Vitals:   02/17/21 1124 02/17/21 1529  BP: 108/83 103/79  Pulse: 91 87  Resp: 16 17  Temp: 97.8 F (36.6 C) 97.8 F (36.6 C)  SpO2: 100% 100%    Exam: Constitutional:   . Appears calm and comfortable ENMT:  . grossly normal hearing  Respiratory:  . CTA bilaterally, no w/r/r.  . Respiratory effort normal.  Cardiovascular:  . RRR, no m/r/g . No LE extremity edema   Psychiatric:  . Mental status o Mood, affect appropriate  I have personally reviewed the following:   Today's Data  . UOP 4800 . CBG up to 402 yesterday, labile. 268 in AM and 135 on follow-up  Scheduled Meds: . vitamin C  250 mg Oral BID  . carvedilol  3.125 mg Oral BID WC  . fluticasone  2 spray Each Nare Daily  . gabapentin  100 mg Oral QHS  . insulin aspart  0-15 Units Subcutaneous TID WC  . insulin aspart  0-5 Units Subcutaneous  QHS  . insulin aspart  6 Units Subcutaneous TID WC  . insulin glargine  30 Units Subcutaneous QHS  . losartan  25 mg Oral Daily  . multivitamin with minerals  1 tablet Oral Daily  . Ensure Max Protein  11 oz Oral BID  . sodium chloride flush  3 mL Intravenous Q12H  . spironolactone  25 mg Oral Daily   Continuous Infusions: . sodium chloride 10 mL/hr at 02/15/21 0042  . cefTRIAXone (ROCEPHIN)  IV 2 g (02/16/21 1700)   And  . metronidazole 500 mg (02/17/21 0912)    Principal Problem:   Cellulitis of great toe of right foot Active  Problems:   Type 2 diabetes mellitus without complication (HCC)   Diabetic foot ulcer (HCC)   Lactic acidosis   Acute metabolic encephalopathy   Chronic combined systolic (congestive) and diastolic (congestive) heart failure (HCC)   Cocaine abuse ([place])   Homeless   Malnutrition of moderate degree   LOS: 5 days   How to contact the [facility] Attending or Consulting provider [place] or covering provider during after hours [place], for this patient?  1. Check the care team in [facility] and look for a) attending/consulting TRH provider listed and b) the [facility] team listed 2. Log into [email or web address] and use [place]'s universal password to access. If you do not have the password, please contact the hospital operator. 3. Locate the [facility] provider you are looking for under [facility] and page to a number that you can be directly reached. 4. If you still have difficulty reaching the provider, please page the [facility] (Director on Call) for the Hospitalists listed on amion for assistance.

## 2021-02-17 NOTE — Plan of Care (Signed)

## 2021-02-17 NOTE — Progress Notes (Addendum)
Inpatient Diabetes Program Recommendations  AACE/ADA: New Consensus Statement on Inpatient Glycemic Control (2015)  Target Ranges:  Prepandial:   less than 140 mg/dL      Peak postprandial:   less than 180 mg/dL (1-2 hours)      Critically ill patients:  140 - 180 mg/dL   Results for Joshua [name], Joshua [name] (MRN [id number]) as of 02/17/2021 09:22  Ref. Range 02/16/2021 08:04 02/16/2021 11:45 02/16/2021 16:44 02/16/2021 21:51  Glucose-Capillary Latest Ref Range: 70 - 99 mg/dL 241 (H)  8 units NOVOLOG  183 (H)  6 units NOVOLOG  333 (H)  14 units NOVOLOG  402 (H)  5 units NOVOLOG  24 units LANTUS   Results for Joshua [name], Joshua [name] (MRN [id number]) as of 02/17/2021 09:22  Ref. Range 02/17/2021 08:20  Glucose-Capillary Latest Ref Range: 70 - 99 mg/dL 268 (H)  11 units NOVOLOG    Home DM Meds: Farxiga 5 mg daily(Not taken in months)                             Metformin 500 mg-1500 mgQAM   Current Orders: Lantus 24 units QHS                             Novolog 0-15 units ac/hs                            Novolog 3 units TID with meals    If patient is discharged on insulin, patient reports he has used Lantus insulin pens in the past and would prefer to use insulin pens (would also need insulin pen needles and glucose monitoring kit).   MD- Note CBG 268 this AM.  Also elevated yesterday afternoon.  Please consider:  1. Increase Lantus to 30 units QHS   2. Increase Novolog Meal Coverage to 6 units TID with meals    --Will follow patient during hospitalization--  Wyn Quaker RN, MSN, CDE Diabetes Coordinator Inpatient Glycemic Control Team Team Pager: [phone] (8a-5p)

## 2021-02-17 NOTE — Progress Notes (Signed)
Date of Admission:  02/12/2021     ID: Joshua [name] is a 50 y.o. male Principal Problem:   Cellulitis of great toe of right foot Active Problems:   Type 2 diabetes mellitus without complication (HCC)   Diabetic foot ulcer (HCC)   Lactic acidosis   Acute metabolic encephalopathy   Chronic combined systolic (congestive) and diastolic (congestive) heart failure (HCC)   Cocaine abuse ([place])   Homeless   Malnutrition of moderate degree    Subjective: Patient does not want to have surgical intervention now. He would like to try oral antibiotics and once this social circumstances is under control he may think of getting surgery if needed Patient does not know what type of allergy had to penicillin.  He thinks that it could be a childhood allergy and his mom would have mentioned it. He may have taken amoxicillin he says.  He would like to try oral amoxicillin or Augmentin.  Medications:  . vitamin C  250 mg Oral BID  . carvedilol  3.125 mg Oral BID WC  . fluticasone  2 spray Each Nare Daily  . gabapentin  100 mg Oral QHS  . insulin aspart  0-15 Units Subcutaneous TID WC  . insulin aspart  0-5 Units Subcutaneous QHS  . insulin aspart  6 Units Subcutaneous TID WC  . insulin glargine  30 Units Subcutaneous QHS  . losartan  25 mg Oral Daily  . multivitamin with minerals  1 tablet Oral Daily  . Ensure Max Protein  11 oz Oral BID  . sodium chloride flush  3 mL Intravenous Q12H  . spironolactone  25 mg Oral Daily    Objective: Vital signs in last 24 hours: Temp:  [97.8 F (36.6 C)-98.1 F (36.7 C)] 97.8 F (36.6 C) (03/07 1529) Pulse Rate:  [78-91] 87 (03/07 1529) Resp:  [15-17] 17 (03/07 1529) BP: (103-131)/(79-94) 103/79 (03/07 1529) SpO2:  [96 %-100 %] 100 % (03/07 1529)  PHYSICAL EXAM:  General: Alert, cooperative, no distress, appears stated age.  Head: Normocephalic, without obvious abnormality, atraumatic. Eyes: Conjunctivae clear, anicteric sclerae. Pupils are  equal ENT Nares normal. No drainage or sinus tenderness. Lips, mucosa, and tongue normal. No Thrush Poor dentition Neck: Supple, symmetrical, no adenopathy, thyroid: non tender no carotid bruit and no JVD. Back: No CVA tenderness. Lungs: Clear to auscultation bilaterally. No Wheezing or Rhonchi. No rales. Heart: Regular rate and rhythm, no murmur, rub or gallop. Abdomen: Soft, non-tender,not distended. Bowel sounds normal. No masses Extremities:         Skin: No rashes or lesions. Or bruising Lymph: Cervical, supraclavicular normal. Neurologic: Grossly non-focal  Lab Results Recent Labs    02/15/21 0544 02/16/21 0529  WBC 7.1  --   HGB 11.7*  --   HCT 38.5*  --   NA 135  --   K 4.6  --   CL 105  --   CO2 25  --   BUN 22*  --   CREATININE 0.92 0.82   Liver Panel No results for input(s): PROT, ALBUMIN, AST, ALT, ALKPHOS, BILITOT, BILIDIR, IBILI in the last 72 hours. Sedimentation Rate No results for input(s): ESRSEDRATE in the last 72 hours. C-Reactive Protein No results for input(s): CRP in the last 72 hours.  Microbiology: Right toe abscess culture sent.  No MRSA or Pseudomonas.  Multiple organism.  We will talk to the lab to ID them. Studies/Results: No results found.   Assessment/Plan: Diabetes mellitus with peripheral neuropathy with right  toe infection with osteomyelitis of the distal phalanx. Culture has been sent.  No MRSA or Pseudomonas.  Currently on Vanco ceftriaxone and Flagyl.  We will discontinue vancomycin.  Penicillin allergy reported but patient is not sure what type of allergy it is.  He thinks it could have been a childhood allergy. He would like to try amoxicillin We will give him tomorrow either Unasyn or p.o. Augmentin in the morning.  CAD status post stent  Cardiomyopathy  Diabetes mellitus  Homelessness  Substance use  Patient would like to go home on oral antibiotics and follow-up with podiatrist as outpatient. Discussed the  management with the patient in great detail.

## 2021-02-17 NOTE — Progress Notes (Signed)
Pharmacy Antibiotic Note  Joshua [name] is a 50 y.o. male admitted on 02/12/2021 with diabetic foot infection/cellulitis of great toe on right foot.  Patient has PMH of DM, HTN, peripheral polyneuropathy, and CHF. Pharmacy has been consulted for Vancomycin dosing.   Plan: Continue Vancomycin dose to 1250mg  IV q12hrs Goal AUC 400-550. Expected AUC: 486 SCr used: 0.92  If continues on Vancomycin will need to check peak/trough levels    Height: 5\' 10"  (177.8 cm) Weight: 85.1 kg (187 lb 9.8 oz) IBW/kg (Calculated) : 73  Temp (24hrs), Avg:98.3 F (36.8 C), Min:97.8 F (36.6 C), Max:99.9 F (37.7 C)  Recent Labs  Lab 02/12/21 1457 02/12/21 2208 02/13/21 0716 02/15/21 0544 02/16/21 0529  WBC 7.0  --  6.2 7.1  --   CREATININE 1.09  --  0.92 0.92 0.82  LATICACIDVEN 2.3* 1.3  --   --   --     Estimated Creatinine Clearance: 112.5 mL/min (by C-G formula based on SCr of 0.82 mg/dL).    Allergies  Allergen Reactions  . Penicillins Other (See Comments)    Patient unsure of allergy   Antimicrobials this admission: Cefepime 3/2 >> 3/3 Vancomycin 3/2 >>   Flagyl 3/2 >>  Ceftriaxone 3/3 >>   Microbiology results:  BCx: NG  Thank you for allowing pharmacy to be a part of this patient's care.  Chinita Greenland PharmD Clinical Pharmacist 02/17/2021

## 2021-02-18 DIAGNOSIS — Z5900 Homelessness unspecified: Secondary | ICD-10-CM

## 2021-02-18 DIAGNOSIS — I429 Cardiomyopathy, unspecified: Secondary | ICD-10-CM | POA: Diagnosis not present

## 2021-02-18 DIAGNOSIS — E11628 Type 2 diabetes mellitus with other skin complications: Secondary | ICD-10-CM | POA: Diagnosis not present

## 2021-02-18 DIAGNOSIS — M869 Osteomyelitis, unspecified: Secondary | ICD-10-CM

## 2021-02-18 DIAGNOSIS — L089 Local infection of the skin and subcutaneous tissue, unspecified: Secondary | ICD-10-CM | POA: Diagnosis not present

## 2021-02-18 LAB — GLUCOSE, CAPILLARY
Glucose-Capillary: 266 mg/dL — ABNORMAL HIGH (ref 70–99)
Glucose-Capillary: 108 mg/dL — ABNORMAL HIGH (ref 70–99)
Glucose-Capillary: 251 mg/dL — ABNORMAL HIGH (ref 70–99)
Glucose-Capillary: 87 mg/dL (ref 70–99)

## 2021-02-18 LAB — BASIC METABOLIC PANEL
Anion gap: 6 (ref 5–15)
BUN: 19 mg/dL (ref 6–20)
CO2: 26 mmol/L (ref 22–32)
Calcium: 8.7 mg/dL — ABNORMAL LOW (ref 8.9–10.3)
Chloride: 105 mmol/L (ref 98–111)
Creatinine, Ser: 0.83 mg/dL (ref 0.61–1.24)
GFR, Estimated: >60 mL/min (ref >60)
Glucose, Bld: 244 mg/dL — ABNORMAL HIGH (ref 70–99)
Potassium: 4.5 mmol/L (ref 3.5–5.1)
Sodium: 137 mmol/L (ref 135–145)

## 2021-02-18 MED ORDER — AMOXICILLIN 250 MG/5ML PO SUSR
100.0000 mg | Freq: Once | ORAL | Status: DC
  Filled 2021-02-18 (×2): qty 5

## 2021-02-18 MED ORDER — AMOXICILLIN 250 MG/5ML PO SUSR
50.0000 mg | Freq: Once | ORAL | Status: AC
  Administered 2021-02-18: 50 mg via ORAL
  Filled 2021-02-18: qty 5

## 2021-02-18 MED ORDER — AMOXICILLIN 250 MG/5ML PO SUSR
100.0000 mg | Freq: Once | ORAL | Status: AC
  Administered 2021-02-18: 100 mg via ORAL
  Filled 2021-02-18: qty 5

## 2021-02-18 MED ORDER — AMOXICILLIN 250 MG/5ML PO SUSR
25.0000 mg | Freq: Once | ORAL | Status: DC
  Filled 2021-02-18 (×2): qty 5

## 2021-02-18 MED ORDER — AMOXICILLIN 250 MG/5ML PO SUSR
250.0000 mg | Freq: Once | ORAL | Status: AC
  Administered 2021-02-18: 250 mg via ORAL
  Filled 2021-02-18: qty 5

## 2021-02-18 MED ORDER — AMOXICILLIN 250 MG/5ML PO SUSR
500.0000 mg | Freq: Once | ORAL | Status: DC
  Filled 2021-02-18 (×2): qty 10

## 2021-02-18 MED ORDER — AMOXICILLIN 250 MG/5ML PO SUSR
50.0000 mg | Freq: Once | ORAL | Status: DC
  Filled 2021-02-18 (×2): qty 5

## 2021-02-18 MED ORDER — AMOXICILLIN 250 MG/5ML PO SUSR
25.0000 mg | Freq: Once | ORAL | Status: AC
  Administered 2021-02-18: 25 mg via ORAL
  Filled 2021-02-18: qty 5

## 2021-02-18 MED ORDER — AMOXICILLIN-POT CLAVULANATE 875-125 MG PO TABS
1.0000 | ORAL_TABLET | Freq: Two times a day (BID) | ORAL | Status: DC
  Administered 2021-02-18 – 2021-02-19 (×3): 1 via ORAL
  Filled 2021-02-18 (×3): qty 1

## 2021-02-18 MED ORDER — INSULIN GLARGINE 100 UNIT/ML ~~LOC~~ SOLN
35.0000 [IU] | Freq: Every day | SUBCUTANEOUS | Status: DC
  Administered 2021-02-18: 35 [IU] via SUBCUTANEOUS
  Filled 2021-02-18 (×2): qty 0.35

## 2021-02-18 NOTE — Progress Notes (Signed)
   Date of Admission:  02/12/2021     ID: Joshua [name] is a 50 y.o. male Principal Problem:   Cellulitis of great toe of right foot Active Problems:   Type 2 diabetes mellitus without complication (HCC)   Diabetic foot ulcer (HCC)   Lactic acidosis   Acute metabolic encephalopathy   Chronic combined systolic (congestive) and diastolic (congestive) heart failure (HCC)   Cocaine abuse (HCC)   Homeless   Malnutrition of moderate degree    Subjective: Doing well Tolerated amoxicillin  Medications:  . amoxicillin-clavulanate  1 tablet Oral Q12H  . vitamin C  250 mg Oral BID  . carvedilol  3.125 mg Oral BID WC  . fluticasone  2 spray Each Nare Daily  . gabapentin  100 mg Oral QHS  . insulin aspart  0-15 Units Subcutaneous TID WC  . insulin aspart  0-5 Units Subcutaneous QHS  . insulin aspart  6 Units Subcutaneous TID WC  . insulin glargine  30 Units Subcutaneous QHS  . losartan  25 mg Oral Daily  . multivitamin with minerals  1 tablet Oral Daily  . Ensure Max Protein  11 oz Oral BID  . sodium chloride flush  3 mL Intravenous Q12H  . spironolactone  25 mg Oral Daily    Objective: Vital signs in last 24 hours: Patient Vitals for the past 24 hrs:  BP Temp Pulse Resp SpO2  02/18/21 1529 116/79 98.6 F (37 C) 83 16 100 %  02/18/21 1137 110/83 98.4 F (36.9 C) 84 16 97 %  02/18/21 0801 111/87 98.6 F (37 C) 75 17 100 %  02/18/21 0403 115/75 97.7 F (36.5 C) 70 16 96 %  02/18/21 0044 113/78 97.9 F (36.6 C) 75 18 98 %  02/17/21 1943 (!) 124/99 98.4 F (36.9 C) (!) 101 18 100 %   PHYSICAL EXAM:  General: Alert, cooperative, no distress, appears stated age.  Lungs: Clear to auscultation bilaterally. No Wheezing or Rhonchi. No rales. Heart: Regular rate and rhythm, no murmur, rub or gallop. Abdomen: Soft, non-tender,not distended. Bowel sounds normal. No masses Extremities:        Skin: No rashes or lesions. Or bruising Lymph: Cervical, supraclavicular  normal. Neurologic: Grossly non-focal  Lab Results Recent Labs    02/16/21 0529 02/18/21 0504  NA  --  137  K  --  4.5  CL  --  105  CO2  --  26  BUN  --  19  CREATININE 0.82 0.83   02/12/21 CRP 1.4 ESR 18  Microbiology:  Studies/Results: No results found.   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 Culture has been sent.  No MRSA or Pseudomonas.  - antibiotics changed to Po augmentin for discharge.  Penicillin allergy reported but patient is not sure what type of allergy it is.  He thinks it could have been a childhood allergy. Graded challenge with Amoxillin- tolerated well Will give Augmentin 861m PO BID for 2 weeks  CAD status post stent  Cardiomyopathy  Diabetes mellitus  Homelessness  Substance use  Follow up as OP ID will sign off- call if needed

## 2021-02-18 NOTE — TOC Initial Note (Addendum)
Transition of Care [facility]) - Initial/Assessment Note    Patient Details  Name: Joshua [name] MRN: [id number] Date of Birth: [birth year]  Transition of Care [facility]) CM/SW Contact:    Pete Pelt, RN Phone Number: 02/18/2021, 10:12 AM  Clinical Narrative:     TOC met with patient at bedside, introductions made.  Topic of visit:  Discharge planning.  Patient amenable to discussion.  Housing:  Patient stated he had a friend to stay with, but no transportation.  When TOC attempted to discuss discharge to friend's house, patient stated this housing was not reliable and he may not want to go there. Resources:  TOC provided patient resources for housing, food, addiction and transportation during this visit.  Patient stated he did not want to review at this time. TOC contact information provided to patient, will follow until discharge.        Update:  Patient stated he was considering surgery but was not sure how he would care for himself.  TOC emphasized resource list provided for PCP, medication/supplies, and transportation in the community. Patient was reminded that post surgical care includes follow up visits with provider(s), for which transport can be arranged.  Patient stated he could not guarantee that he could follow up with provider(s). TOC asked that patient speak provider to discuss follow up care and further determine compliance.  TOC will follow up with discharge plan.       Patient Goals and CMS Choice        Expected Discharge Plan and Services                                                Prior Living Arrangements/Services                       Activities of Daily Living Home Assistive Devices/Equipment: None ADL Screening (condition at time of admission) Patient's cognitive ability adequate to safely complete daily activities?: Yes Is the patient deaf or have difficulty hearing?: No Does the patient have difficulty seeing, even when wearing  glasses/contacts?: No Does the patient have difficulty concentrating, remembering, or making decisions?: No Patient able to express need for assistance with ADLs?: Yes Does the patient have difficulty dressing or bathing?: No Independently performs ADLs?: Yes (appropriate for developmental age) Does the patient have difficulty walking or climbing stairs?: No Weakness of Legs: Both Weakness of Arms/Hands: None  Permission Sought/Granted                  Emotional Assessment              Admission diagnosis:  Somnolence [R40.0] Foot pain, right [M79.671] Cellulitis of great toe of right foot [L03.031] Blister of toe of right foot with infection, initial encounter [W65.681E, L08.9] Patient Active Problem List   Diagnosis Date Noted  . Malnutrition of moderate degree 02/14/2021  . Cocaine abuse ([place]) 02/13/2021  . Homeless 02/13/2021  . Cellulitis of great toe of right foot 02/12/2021  . Diabetic foot ulcer ([place]) 02/12/2021  . Lactic acidosis 02/12/2021  . Acute metabolic encephalopathy 75/17/0017  . Chronic combined systolic (congestive) and diastolic (congestive) heart failure ([place]) 02/12/2021  . SIRS (systemic inflammatory response syndrome) ([place]) 10/04/2020  . Acute respiratory failure with hypoxia ([place]) 07/15/2020  . Multifocal pneumonia 07/15/2020  . Type 2 diabetes mellitus without complication (  [place]) 07/15/2020  . History of MI (myocardial infarction) 07/15/2020  . Symptomatic anemia 07/15/2020  . Acute ST elevation myocardial infarction (STEMI) involving left anterior descending (LAD) coronary artery ([place]) 09/09/2018  . STEMI involving left anterior descending coronary artery ([place]) 09/09/2018  . Hypoglycemia 06/02/2016   PCP:  Patient, No Pcp Per Pharmacy:   [facility] [address] ([address]), [place])  [address] Phone: [phone] Fax: [phone]  [place], [place] [address] [place] [address] Phone: [phone] Fax: [phone]     Social Determinants of Health (SDOH) Interventions    Readmission Risk Interventions Readmission Risk Prevention Plan 09/10/2018  Post Dischage Appt Complete  Medication Screening Complete  Transportation Screening Complete  PCP follow-up Complete  Some recent data might be hidden

## 2021-02-18 NOTE — Progress Notes (Signed)
PROGRESS NOTE  Joshua [name] GTX:[id number] DOB: 1971-10-29 DOA: 02/12/2021 PCP: Patient, No Pcp Per  Brief History   49yom w/ uncontrolled DM, CHF presented w/ bilateral foot pain. 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  Seen by vascular surgery and podiatry.  Underwent angiogram which showed sufficient right lower extremity blood flow.  Recommendation of distal phalanx amputation, but patient prefers to wait despite multiple discussions. Plan release on oral antibiotic when blood sugar better controlled. TOC following for issues with homelessness.  Likely home 3/9.  A & P  Right great toe diabetic foot infection with osteomyelitis by MRI. --Angiogram did show preserved blood flow to the right lower extremity. --Patient has declined surgery at this point but wants to proceed as an outpatient.  Plan will be to transition to oral antibiotics and discharge home with outpatient follow-up with podiatry.  DM type 2 w/ peripheral polyneuropathy uncontrolled w/ HgbA1c 11.9 --CBG remains labile with poor control.  Increase Lantus 35 units tonight.  Continue meal coverage and sliding scale insulin. --he wants to restart [place] on discharge --he wants to go home on insulin pens  Chronic systolic, diastolic CHF, CAD s/p PCI w/ stent x2 08/2018 --No significant lower extremity edema --continue Coreg, losartan, spironolactone --he is interested in restarting Entresto. This can be done as an outpatient. --needs outpatient follow-up if he will go  Cocaine positive, suspect abuse --can continue carvedilol --TOC consult placed  Smoker  --recommend cessation  Homeless --TOC consult. Needs PCP, housing and medication assistance.  Disposition Plan:  Discussion: Patient prefers to avoid surgery at this time, plan for oral antibiotics and discharge once blood sugars controlled.  Status is: Inpatient  Remains inpatient appropriate because:IV treatments appropriate due  to intensity of illness or inability to take PO and Inpatient level of care appropriate due to severity of illness   Dispo: The patient is from: Home              Anticipated d/c is to: Home              Patient currently is not medically stable to d/c.   Difficult to place patient No  DVT prophylaxis: SCDs Start: 02/12/21 2312   Code Status: Full Code Level of care: Med-Surg Family Communication: none  Murray Hodgkins, MD  [facility] Direct contact: see [email or web address] (further directions at bottom of note if needed) 7PM-7AM contact night coverage as at bottom of note 02/18/2021, 4:47 PM  LOS: 6 days   Significant Hospital Events   .    Consults:  . Podiatry . Vascular surgery . ID   Procedures:  .   Significant Diagnostic Tests:  Marland Kitchen    Micro Data:  .    Antimicrobials:  .   Interval History/Subjective  CC: f/u DM toe infection  Feels ok Not quite ready for surgery, wants to go home first Wants to restart Entresto Wants to go home on insulin flex pen  Objective   Vitals:  Vitals:   02/18/21 1137 02/18/21 1529  BP: 110/83 116/79  Pulse: 84 83  Resp: 16 16  Temp: 98.4 F (36.9 C) 98.6 F (37 C)  SpO2: 97% 100%    Exam: Constitutional:   . Appears calm and comfortable ENMT:  . grossly normal hearing  Respiratory:  . CTA bilaterally, no w/r/r.  . Respiratory effort normal. Cardiovascular:  . RRR, no m/r/g Psychiatric:  . Mental status o Mood, affect appropriate  I have personally reviewed the following:  Today's Data  . UOP 325 . CBG variable, 350 last night, 266 this AM, 108 at lunch, 251 this afternoon  Scheduled Meds: . amoxicillin-clavulanate  1 tablet Oral Q12H  . vitamin C  250 mg Oral BID  . carvedilol  3.125 mg Oral BID WC  . fluticasone  2 spray Each Nare Daily  . gabapentin  100 mg Oral QHS  . insulin aspart  0-15 Units Subcutaneous TID WC  . insulin aspart  0-5 Units Subcutaneous QHS  . insulin aspart  6 Units  Subcutaneous TID WC  . insulin glargine  35 Units Subcutaneous QHS  . losartan  25 mg Oral Daily  . multivitamin with minerals  1 tablet Oral Daily  . Ensure Max Protein  11 oz Oral BID  . sodium chloride flush  3 mL Intravenous Q12H  . spironolactone  25 mg Oral Daily   Continuous Infusions: . sodium chloride 10 mL/hr at 02/15/21 2297    Principal Problem:   Cellulitis of great toe of right foot Active Problems:   Type 2 diabetes mellitus without complication (HCC)   Diabetic foot ulcer (HCC)   Lactic acidosis   Acute metabolic encephalopathy   Chronic combined systolic (congestive) and diastolic (congestive) heart failure (HCC)   Cocaine abuse ([place])   Homeless   Malnutrition of moderate degree   LOS: 6 days   How to contact the [facility] Attending or Consulting provider [place] or covering provider during after hours [place], for this patient?  1. Check the care team in [facility] and look for a) attending/consulting TR[facility] provider listed and b) the [facility] team listed 2. Log into [email or web address] and use [place]'s universal password to access. If you do not have the password, please contact the hospital operator. 3. Locate the [facility] provider you are looking for under [facility] and page to a number that you can be directly reached. 4. If you still have difficulty reaching the provider, please page the [facility] (Director on Call) for the Hospitalists listed on amion for assistance.

## 2021-02-19 LAB — GLUCOSE, CAPILLARY: Glucose-Capillary: 242 mg/dL — ABNORMAL HIGH (ref 70–99)

## 2021-02-19 MED ORDER — DAPAGLIFLOZIN PROPANEDIOL 5 MG PO TABS: 5.0000 mg | ORAL_TABLET | Freq: Every day | ORAL | 1 refills | Status: DC

## 2021-02-19 MED ORDER — LOSARTAN POTASSIUM 25 MG PO TABS: 25.0000 mg | ORAL_TABLET | Freq: Every day | ORAL | 1 refills | Status: DC

## 2021-02-19 MED ORDER — AMOXICILLIN-POT CLAVULANATE 875-125 MG PO TABS: 1.0000 | ORAL_TABLET | Freq: Two times a day (BID) | ORAL | 0 refills | Status: AC

## 2021-02-19 MED ORDER — METFORMIN HCL 500 MG PO TABS
500.0000 mg | ORAL_TABLET | Freq: Every day | ORAL | 1 refills | Status: AC
Start: 2021-02-19 — End: 2021-04-20

## 2021-02-19 MED ORDER — CARVEDILOL 3.125 MG PO TABS: 3.1250 mg | ORAL_TABLET | Freq: Two times a day (BID) | ORAL | 1 refills | Status: DC

## 2021-02-19 MED ORDER — INSULIN GLARGINE 100 UNIT/ML SOLOSTAR PEN: 40.0000 [IU] | PEN_INJECTOR | Freq: Every day | SUBCUTANEOUS | 11 refills | Status: DC

## 2021-02-19 MED ORDER — ADULT MULTIVITAMIN W/MINERALS CH: 1.0000 | ORAL_TABLET | Freq: Every day | ORAL | 1 refills | Status: DC

## 2021-02-19 MED ORDER — PEN NEEDLES 31G X 5 MM MISC: 40.0000 [IU] | Freq: Every day | 1 refills | Status: DC

## 2021-02-19 MED ORDER — FUROSEMIDE 20 MG PO TABS: 20.0000 mg | ORAL_TABLET | Freq: Every day | ORAL | 1 refills | Status: DC | PRN

## 2021-02-19 MED ORDER — GABAPENTIN 100 MG PO CAPS: 100.0000 mg | ORAL_CAPSULE | Freq: Every day | ORAL | 1 refills | Status: DC

## 2021-02-19 MED ORDER — SPIRONOLACTONE 25 MG PO TABS: 25.0000 mg | ORAL_TABLET | Freq: Every day | ORAL | 1 refills | Status: DC

## 2021-02-19 MED ORDER — ASCORBIC ACID 250 MG PO TABS: 250.0000 mg | ORAL_TABLET | Freq: Two times a day (BID) | ORAL | 1 refills | Status: DC

## 2021-02-19 NOTE — Discharge Summary (Signed)
Physician Discharge Summary  Joshua [name] FIE:[id number] DOB: 04-23-1971 DOA: 02/12/2021  PCP: Patient, No Pcp Per  Admit date: 02/12/2021 Discharge date: 02/19/2021  Admitted From: Home Disposition:  Home  Recommendations for Outpatient Follow-up:  1. Follow up with PCP in 1-2 weeks 2. Follow up with Podiatry 3. Please obtain BMP/CBC in one week 4. Please follow up on the following pending results:None  Home Health:No Equipment/Devices: None Discharge Condition: Stable CODE STATUS: full Diet recommendation: Heart Healthy / Carb Modified   Brief/Interim Summary: 49yom w/ uncontrolled DM, CHF presented w/ bilateral foot pain. Admitted for right great toe diabetic osteomyelitis.  Seen by vascular surgery and podiatry.  Underwent angiogram which showed sufficient right lower extremity blood flow.  Recommendation of distal phalanx amputation, but patient prefers to wait despite multiple discussions. Patient initially received cefepime and vancomycin followed by ceftriaxone and metronidazole while in the hospital, discharged on 2 more weeks of Augmentin per ID recommendations.  He was instructed to follow-up with podiatry in 1 week and during that follow-up visit he will decide about going forward with amputation.  Patient has uncontrolled diabetes mellitus with peripheral polyneuropathy, A1c of 11.9.  He was restarted on home dose of Farxiga and Metformin along with Lantus.  He will need a close follow-up with PCP for better control of his diabetes.  Patient has an history of chronic combined heart failure and coronary artery disease as/P PCI with stent placement x2 in September 2019.  No acute concern during current hospitalization.  He will resume his home dose of Coreg, losartan and spironolactone.  He was interested to restarting Delene Loll, that can be done as an outpatient by his cardiologist.  Patient has an history of cocaine abuse.  Extensively counseled and plan is to continue  with carvedilol.  He will continue rest of his home medications and follow-up with his providers.  Discharge Diagnoses:  Principal Problem:   Osteomyelitis of great toe of right foot ([place]) Active Problems:   Type 2 diabetes mellitus without complication (HCC)   Cellulitis of great toe of right foot   Diabetic foot ulcer (HCC)   Lactic acidosis   Acute metabolic encephalopathy   Chronic combined systolic (congestive) and diastolic (congestive) heart failure (HCC)   Cocaine abuse ([place])   Homeless   Malnutrition of moderate degree   Discharge Instructions  Discharge Instructions    Diet - low sodium heart healthy   Complete by: As directed    Discharge instructions   Complete by: As directed    It was pleasure taking care of you. It is very important that you keep taking antibiotics for next 2 weeks and follow-up with podiatry for further recommendations.  In order to avoid stomach upset with antibiotics, please take it with meals.  Adding yogurt to your diet will also help. Please check your blood glucose level 2-3 times a day and use your medications along with insulin as directed, follow-up with your primary care provider closely for better control of your diabetes. Follow-up with your cardiologist for further recommendations about your heart medications.   Increase activity slowly   Complete by: As directed    No dressing needed   Complete by: As directed      Allergies as of 02/19/2021      Reactions   Penicillins Other (See Comments)   Patient unsure of allergy      Medication List    TAKE these medications   amoxicillin-clavulanate 875-125 MG tablet Commonly known as: AUGMENTIN Take  1 tablet by mouth every 12 (twelve) hours for 15 days.   ascorbic acid 250 MG tablet Commonly known as: VITAMIN C Take 1 tablet (250 mg total) by mouth 2 (two) times daily.   carvedilol 3.125 MG tablet Commonly known as: COREG Take 1 tablet (3.125 mg total) by mouth 2 (two) times  daily with a meal.   dapagliflozin propanediol 5 MG Tabs tablet Commonly known as: FARXIGA Take 1 tablet (5 mg total) by mouth daily.   fluticasone 50 MCG/ACT nasal spray Commonly known as: FLONASE Place 2 sprays into both nostrils daily.   furosemide 20 MG tablet Commonly known as: Lasix Take 1 tablet (20 mg total) by mouth daily as needed (for weight gain >3lbs in 1 days and >5lbs in 2 days).   gabapentin 100 MG capsule Commonly known as: NEURONTIN Take 1 capsule (100 mg total) by mouth at bedtime.   insulin glargine 100 UNIT/ML Solostar Pen Commonly known as: LANTUS Inject 40 Units into the skin daily.   losartan 25 MG tablet Commonly known as: Cozaar Take 1 tablet (25 mg total) by mouth daily.   metFORMIN 500 MG tablet Commonly known as: Glucophage Take 1 tablet (500 mg total) by mouth daily with breakfast.   multivitamin with minerals Tabs tablet Take 1 tablet by mouth daily.   nitroGLYCERIN 0.4 MG SL tablet Commonly known as: NITROSTAT Place 1 tablet (0.4 mg total) under the tongue every 5 (five) minutes x 3 doses as needed for chest pain.   Pen Needles 31G X 5 MM Misc 40 Units by Does not apply route at bedtime.   spironolactone 25 MG tablet Commonly known as: ALDACTONE Take 1 tablet (25 mg total) by mouth daily.            Discharge Care Instructions  (From admission, onward)         Start     Ordered   02/19/21 0000  No dressing needed        02/19/21 0948          Follow-up Information    [facility] up on 02/24/2021.   Specialty: Cardiology Why: at 8:30am. Enter through the [place] entrance Contact information: [place]             Allergies  Allergen Reactions  . Penicillins Other (See Comments)    Patient unsure of allergy    Consultations:  ID  Podiatry  Procedures/Studies: CT Head Wo  Contrast  Result Date: 02/12/2021 CLINICAL DATA:  Mental status changes EXAM: CT HEAD WITHOUT CONTRAST TECHNIQUE: Contiguous axial images were obtained from the base of the skull through the vertex without intravenous contrast. COMPARISON:  10/04/2019 FINDINGS: Brain: No evidence of acute infarction, hemorrhage, hydrocephalus, extra-axial collection or mass lesion/mass effect. Vascular: No hyperdense vessel or unexpected calcification. Skull: Normal. Negative for fracture or focal lesion. Sinuses/Orbits: Mild mucosal thickening is noted within the sphenoid sinus on the left. Other: None. IMPRESSION: Mild chronic mucosal thickening in the sphenoid sinus. No intracranial abnormality is noted. Electronically Signed   By: Inez Catalina M.D.   On: 02/12/2021 16:13   MR FOOT RIGHT W WO CONTRAST  Result Date: 02/14/2021 CLINICAL DATA:  Diabetic foot ulcer along the great toe. Osteomyelitis. EXAM: MRI OF THE RIGHT FOREFOOT WITHOUT AND WITH CONTRAST TECHNIQUE: Multiplanar, multisequence MR imaging of the right forefoot was performed before and after the administration of intravenous contrast. CONTRAST:  48mL GADAVIST GADOBUTROL 1 MMOL/ML IV SOLN COMPARISON:  Radiographs 02/12/2021 FINDINGS: Bones/Joint/Cartilage Diffuse abnormal accentuated edema signal and enhancement in the distal phalanx of the great toe compatible with osteomyelitis. Subtle cortical demineralization suggested on the T1 weighted images. Non-fragmented osteochondral lesions of the fifth distal first metatarsal head for example on image 13 of series 11, likely chronic. Small erosion or degenerative subcortical cyst medially along the head of the proximal phalanx great toe. No significant effusion of the first interphalangeal joint or first MTP joint. Subtle accentuated T2 signal of the distal phalanges of the second and third toes but without reduced T1 signal; this may well be incidental and is probably not from osteomyelitis. Ligaments Lisfranc ligament  intact. Muscles and Tendons Low-level edema in the plantar musculature of the foot, probably neurogenic, less likely to be from low-grade myositis. Soft tissues Diffuse subcutaneous edema and enhancement in the great toe compatible with cellulitis. No drainable abscess identified. IMPRESSION: 1. 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 2. Diffuse subcutaneous edema and enhancement in the great toe compatible with cellulitis. No drainable abscess identified. 3. Low-level edema in the plantar musculature of the foot, probably neurogenic, less likely to be from low-grade myositis. 4. Non-fragmented osteochondral lesions of the distal first metatarsal head and medially along the head of the proximal phalanx great toe. 5. Faintly accentuated T2 signal in the distal phalanges of the second and third toes, but without corresponding reduced T1 signal, not considered specific for osteomyelitis and probably incidental. Electronically Signed   By: Van Clines M.D.   On: 02/14/2021 08:09   PERIPHERAL VASCULAR CATHETERIZATION  Result Date: 02/14/2021 See Op Note  US ARTERIAL ABI (SCREENING LOWER EXTREMITY)  Result Date: 02/13/2021 CLINICAL DATA:  50 year old with diabetic foot infection. EXAM: NONINVASIVE PHYSIOLOGIC VASCULAR STUDY OF BILATERAL LOWER EXTREMITIES TECHNIQUE: Evaluation of both lower extremities were performed at rest, including calculation of ankle-brachial indices with single level Doppler, pressure and pulse volume recording. COMPARISON:  None. FINDINGS: Right ABI:  1.33 Left ABI:  1.31 Right Lower Extremity: Normal triphasic Doppler waveform in the posterior tibial artery. Left Lower Extremity:  Normal arterial waveforms at the ankle. 1.0-1.4 Normal IMPRESSION: Normal resting ankle-brachial indices bilaterally. Electronically Signed   By: Markus Daft M.D.   On: 02/13/2021 16:19   DG Foot Complete Right  Result Date: 02/12/2021 CLINICAL DATA:  Right great toe ulcer. EXAM: RIGHT FOOT  COMPLETE - 3+ VIEW COMPARISON:  No recent prior. FINDINGS: Soft tissue swelling right great toe. No radiopaque foreign body. Subchondral cyst noted about the distal aspect of the proximal phalanx of the right great toe, this is most likely degenerative. No cortical erosion. Mild degenerative change first MTP joint. No evidence of fracture or dislocation. No acute bony abnormality. IMPRESSION: 1. Soft tissue swelling right great toe. No radiopaque foreign body. Subchondral cyst noted about the distal aspect of the proximal phalanx of the right great toe, this is most likely degenerative. No underlying cortical erosion. 2. Mild degenerative change first MTP joint. No acute bony abnormality. Electronically Signed   By: Marcello Moores  Register   On: 02/12/2021 15:08    Subjective: Patient was feeling better when seen today.  No new complaint.  He was having multiple questions regarding diabetic diet and antibiotics which were answered.  Discharge Exam: Vitals:   02/19/21 0440 02/19/21 0845  BP: 100/76 113/72  Pulse: 72 76  Resp: 20 16  Temp: (!) 97 F (36.1 C) 97.8 F (36.6 C)  SpO2: 97%  100%   Vitals:   02/18/21 2304 02/19/21 0440 02/19/21 0500 02/19/21 0845  BP: 123/85 100/76  113/72  Pulse: 89 72  76  Resp: 18 20  16   Temp: 98 F (36.7 C) (!) 97 F (36.1 C)  97.8 F (36.6 C)  TempSrc:    Oral  SpO2: 100% 97%  100%  Weight:   85.8 kg   Height:        General: Pt is alert, awake, not in acute distress Cardiovascular: RRR, S1/S2 +, no rubs, no gallops Respiratory: CTA bilaterally, no wheezing, no rhonchi Abdominal: Soft, NT, ND, bowel sounds + Extremities: no edema, no cyanosis   The results of significant diagnostics from this hospitalization (including imaging, microbiology, ancillary and laboratory) are listed below for reference.    Microbiology: Recent Results (from the past 240 hour(s))  Blood culture (routine x 2)     Status: None   Collection Time: 02/12/21  2:57 PM    Specimen: BLOOD  Result Value Ref Range Status   Specimen Description BLOOD BLOOD RIGHT FOREARM  Final   Special Requests   Final    BOTTLES DRAWN AEROBIC AND ANAEROBIC Blood Culture adequate volume   Culture   Final    NO GROWTH 5 DAYS Performed at [facility], [address] [place], [place] [address]    Report Status 02/17/2021 FINAL  Final  Blood culture (routine x 2)     Status: None   Collection Time: 02/12/21  3:18 PM   Specimen: BLOOD  Result Value Ref Range Status   Specimen Description BLOOD BLOOD LEFT FOREARM  Final   Special Requests   Final    BOTTLES DRAWN AEROBIC AND ANAEROBIC Blood Culture adequate volume   Culture   Final    NO GROWTH 5 DAYS Performed at [facility], [address] [place], [place] [address]    Report Status 02/17/2021 FINAL  Final  Resp Panel by RT-PCR (Flu A&B, Covid) Nasopharyngeal Swab     Status: None   Collection Time: 02/12/21  5:55 PM   Specimen: Nasopharyngeal Swab; Nasopharyngeal(NP) swabs in vial transport medium  Result Value Ref Range Status   SARS Coronavirus 2 by RT PCR NEGATIVE NEGATIVE Final    Comment: (NOTE) SARS-CoV-2 target nucleic acids are NOT DETECTED.  The SARS-CoV-2 RNA is generally detectable in upper respiratory specimens during the acute phase of infection. The lowest concentration of SARS-CoV-2 viral copies this assay can detect is 138 copies/mL. A negative result does not preclude SARS-Cov-2 infection and should not be used as the sole basis for treatment or other patient management decisions. A negative result may occur with  improper specimen collection/handling, submission of specimen other than nasopharyngeal swab, presence of viral mutation(s) within the areas targeted by this assay, and inadequate number of viral copies(<138 copies/mL). A negative result must be combined with clinical observations, patient history, and epidemiological information. The expected result is  Negative.  Fact Sheet for Patients:  EntrepreneurPulse.com.au  Fact Sheet for Healthcare Providers:  IncredibleEmployment.be  This test is no t yet approved or cleared by the [place] FDA and  has been authorized for detection and/or diagnosis of SARS-CoV-2 by FDA under an Emergency Use Authorization (EUA). This EUA will remain  in effect (meaning this test can be used) for the duration of the COVID-19 declaration under Section 564(b)(1) of the Act, 21 U.S.C.section 360bbb-3(b)(1), unless the authorization is terminated  or revoked sooner.       Influenza A  by PCR NEGATIVE NEGATIVE Final   Influenza B by PCR NEGATIVE NEGATIVE Final    Comment: (NOTE) The Xpert Xpress SARS-CoV-2/FLU/RSV plus assay is intended as an aid in the diagnosis of influenza from Nasopharyngeal swab specimens and should not be used as a sole basis for treatment. Nasal washings and aspirates are unacceptable for Xpert Xpress SARS-CoV-2/FLU/RSV testing.  Fact Sheet for Patients: EntrepreneurPulse.com.au  Fact Sheet for Healthcare Providers: IncredibleEmployment.be  This test is not yet approved or cleared by the [place] FDA and has been authorized for detection and/or diagnosis of SARS-CoV-2 by FDA under an Emergency Use Authorization (EUA). This EUA will remain in effect (meaning this test can be used) for the duration of the COVID-19 declaration under Section 564(b)(1) of the Act, 21 U.S.C. section 360bbb-3(b)(1), unless the authorization is terminated or revoked.  Performed at [facility], [place], [place] [address]   Aerobic Culture w Gram Stain (superficial specimen)     Status: Abnormal   Collection Time: 02/14/21  6:42 PM   Specimen: Toe; Wound  Result Value Ref Range Status   Specimen Description   Final    TOE RIGHT TOE Performed at [facility], [address]  [place], [place] [address]    Special Requests   Final    NONE Performed at [facility], [address] [place], [place] [address]    Gram Stain   Final    FEW WBC PRESENT, PREDOMINANTLY PMN NO ORGANISMS SEEN    Culture (A)  Final    MULTIPLE ORGANISMS PRESENT, NONE PREDOMINANT NO GROUP A STREP (S.PYOGENES) ISOLATED NO STAPHYLOCOCCUS AUREUS ISOLATED No Pseudomonas species isolated Performed at [facility] [address] [place], [place] [address]    Report Status 02/17/2021 FINAL  Final     Labs: BNP (last 3 results) Recent Labs    07/17/20 0936 09/28/20 2005 10/03/20 2244  BNP 236.4* 947.6* 149.7*   Basic Metabolic Panel: Recent Labs  Lab 02/12/21 1457 02/12/21 2208 02/13/21 0716 02/15/21 0544 02/16/21 0529 02/18/21 0504  NA 134*  --  140 135  --  137  K 4.5  --  4.1 4.6  --  4.5  CL 101  --  110 105  --  105  CO2 25  --  24 25  --  26  GLUCOSE 496*  --  194* 389*  --  244*  BUN 11  --  10 22*  --  19  CREATININE 1.09  --  0.92 0.92 0.82 0.83  CALCIUM 8.6*  --  8.2* 8.5*  --  8.7*  MG  --  2.1 2.0  --   --   --    Liver Function Tests: Recent Labs  Lab 02/12/21 1457 02/13/21 0716  AST 16 12*  ALT 9 8  ALKPHOS 87 70  BILITOT 0.4 0.6  PROT 6.9 6.0*  ALBUMIN 3.4* 2.9*   No results for input(s): LIPASE, AMYLASE in the last 168 hours. No results for input(s): AMMONIA in the last 168 hours. CBC: Recent Labs  Lab 02/12/21 1457 02/13/21 0716 02/15/21 0544  WBC 7.0 6.2 7.1  NEUTROABS 4.4  --   --   HGB 12.5* 12.0* 11.7*  HCT 40.5 37.8* 38.5*  MCV 85.1 84.2 84.8  PLT 325 274 279   Cardiac Enzymes: No results for input(s): CKTOTAL, CKMB, CKMBINDEX, TROPONINI in the last 168 hours. BNP: Invalid input(s): POCBNP CBG: Recent Labs  Lab 02/18/21 0801 02/18/21 1145  02/18/21 1631 02/18/21 2043 02/19/21 0846  GLUCAP 266* 108* 251* 87 242*   D-Dimer No results for input(s): DDIMER in the last 72 hours. Hgb A1c No results for  input(s): HGBA1C in the last 72 hours. Lipid Profile No results for input(s): CHOL, HDL, LDLCALC, TRIG, CHOLHDL, LDLDIRECT in the last 72 hours. Thyroid function studies No results for input(s): TSH, T4TOTAL, T3FREE, THYROIDAB in the last 72 hours.  Invalid input(s): FREET3 Anemia work up No results for input(s): VITAMINB12, FOLATE, FERRITIN, TIBC, IRON, RETICCTPCT in the last 72 hours. Urinalysis    Component Value Date/Time   COLORURINE STRAW (A) 02/12/2021 1755   APPEARANCEUR CLEAR (A) 02/12/2021 1755   LABSPEC 1.018 02/12/2021 1755   PHURINE 7.0 02/12/2021 1755   GLUCOSEU >=500 (A) 02/12/2021 1755   HGBUR NEGATIVE 02/12/2021 1755   BILIRUBINUR NEGATIVE 02/12/2021 1755   KETONESUR NEGATIVE 02/12/2021 1755   PROTEINUR NEGATIVE 02/12/2021 1755   NITRITE NEGATIVE 02/12/2021 1755   LEUKOCYTESUR NEGATIVE 02/12/2021 1755   Sepsis Labs Invalid input(s): PROCALCITONIN,  WBC,  LACTICIDVEN Microbiology Recent Results (from the past 240 hour(s))  Blood culture (routine x 2)     Status: None   Collection Time: 02/12/21  2:57 PM   Specimen: BLOOD  Result Value Ref Range Status   Specimen Description BLOOD BLOOD RIGHT FOREARM  Final   Special Requests   Final    BOTTLES DRAWN AEROBIC AND ANAEROBIC Blood Culture adequate volume   Culture   Final    NO GROWTH 5 DAYS Performed at [facility], [address] [place], [place] [address]    Report Status 02/17/2021 FINAL  Final  Blood culture (routine x 2)     Status: None   Collection Time: 02/12/21  3:18 PM   Specimen: BLOOD  Result Value Ref Range Status   Specimen Description BLOOD BLOOD LEFT FOREARM  Final   Special Requests   Final    BOTTLES DRAWN AEROBIC AND ANAEROBIC Blood Culture adequate volume   Culture   Final    NO GROWTH 5 DAYS Performed at [facility], [address] [place], [place] [address]    Report Status 02/17/2021 FINAL  Final  Resp Panel by RT-PCR (Flu A&B, Covid) Nasopharyngeal Swab      Status: None   Collection Time: 02/12/21  5:55 PM   Specimen: Nasopharyngeal Swab; Nasopharyngeal(NP) swabs in vial transport medium  Result Value Ref Range Status   SARS Coronavirus 2 by RT PCR NEGATIVE NEGATIVE Final    Comment: (NOTE) SARS-CoV-2 target nucleic acids are NOT DETECTED.  The SARS-CoV-2 RNA is generally detectable in upper respiratory specimens during the acute phase of infection. The lowest concentration of SARS-CoV-2 viral copies this assay can detect is 138 copies/mL. A negative result does not preclude SARS-Cov-2 infection and should not be used as the sole basis for treatment or other patient management decisions. A negative result may occur with  improper specimen collection/handling, submission of specimen other than nasopharyngeal swab, presence of viral mutation(s) within the areas targeted by this assay, and inadequate number of viral copies(<138 copies/mL). A negative result must be combined with clinical observations, patient history, and epidemiological information. The expected result is Negative.  Fact Sheet for Patients:  EntrepreneurPulse.com.au  Fact Sheet for Healthcare Providers:  IncredibleEmployment.be  This test is no t yet approved or cleared by the [place] FDA and  has been authorized for detection and/or diagnosis of SARS-CoV-2 by FDA under an Emergency Use Authorization (EUA). This EUA  will remain  in effect (meaning this test can be used) for the duration of the COVID-19 declaration under Section 564(b)(1) of the Act, 21 U.S.C.section 360bbb-3(b)(1), unless the authorization is terminated  or revoked sooner.       Influenza A by PCR NEGATIVE NEGATIVE Final   Influenza B by PCR NEGATIVE NEGATIVE Final    Comment: (NOTE) The Xpert Xpress SARS-CoV-2/FLU/RSV plus assay is intended as an aid in the diagnosis of influenza from Nasopharyngeal swab specimens and should not be used as a sole basis  for treatment. Nasal washings and aspirates are unacceptable for Xpert Xpress SARS-CoV-2/FLU/RSV testing.  Fact Sheet for Patients: EntrepreneurPulse.com.au  Fact Sheet for Healthcare Providers: IncredibleEmployment.be  This test is not yet approved or cleared by the [place] FDA and has been authorized for detection and/or diagnosis of SARS-CoV-2 by FDA under an Emergency Use Authorization (EUA). This EUA will remain in effect (meaning this test can be used) for the duration of the COVID-19 declaration under Section 564(b)(1) of the Act, 21 U.S.C. section 360bbb-3(b)(1), unless the authorization is terminated or revoked.  Performed at [facility], [place], [place] [address]   Aerobic Culture w Gram Stain (superficial specimen)     Status: Abnormal   Collection Time: 02/14/21  6:42 PM   Specimen: Toe; Wound  Result Value Ref Range Status   Specimen Description   Final    TOE RIGHT TOE Performed at [facility], [address], [place], [place] [address]    Special Requests   Final    NONE Performed at [facility], [address] [place], [place] [address]    Gram Stain   Final    FEW WBC PRESENT, PREDOMINANTLY PMN NO ORGANISMS SEEN    Culture (A)  Final    MULTIPLE ORGANISMS PRESENT, NONE PREDOMINANT NO GROUP A STREP (S.PYOGENES) ISOLATED NO STAPHYLOCOCCUS AUREUS ISOLATED No Pseudomonas species isolated Performed at [facility] [address] [place],  [address]    Report Status 02/17/2021 FINAL  Final    Time coordinating discharge: Over 30 minutes  SIGNED:  Lorella Nimrod, MD  [facility] 02/19/2021, 9:49 AM  If 7PM-7AM, please contact night-coverage [email or web address]  This record has been created using Systems analyst. Errors have been sought and corrected,but may not always be located. Such creation errors do not reflect on the  standard of care.

## 2021-02-20 LAB — AEROBIC CULTURE W GRAM STAIN (SUPERFICIAL SPECIMEN)

## 2021-02-21 NOTE — Progress Notes (Deleted)
   Patient ID: Joshua [name], male    DOB: 08-21-1971, 50 y.o.   MRN: [id number]  HPI  Joshua [name] is a 50 y/o male with a history of   Echo report from 07/16/20 reviewed and showed an EF of 25-30% along with mild LVH and moderate Joshua.   LHC done 09/09/18 showed:  Prox RCA to Mid RCA lesion is 95% stenosed.  Mid RCA lesion is 95% stenosed.  Dist RCA lesion is 75% stenosed.  Prox Cx lesion is 75% stenosed.  Mid Cx lesion is 75% stenosed.  Prox LAD lesion is 100% stenosed.  A drug-eluting stent was successfully placed using a STENT SIERRA 3.00 X 23 MM.  Post intervention, there is a 0% residual stenosis.  There is moderate to severe left ventricular systolic dysfunction.  LV end diastolic pressure is normal.  The left ventricular ejection fraction is 25-35% by visual estimate.   Conclusion STEMI presentation anterior wall Multivessel coronary disease 100% mid LAD TIMI 0 was the culprit lesion IRA Serial disease in the mid circumflex multiple lesions in the mid RCA and distal Distal 100% RCA with collaterals left to right Successful PCI and stent of mid 100% LAD which was the IRA DES stent deployed 3.0 x 23 mm Xience [place] with a 3.25 x 15 mm euphoria to 15 atm  Admitted 02/12/21 due to bilateral foot pain. Admitted for right great toe diabetic osteomyelitis. ID, vascular and podiatry consults obtained. Underwent angiogram which showed sufficient right lower extremity blood flow. Recommendation of distal phalanx amputation, but patient prefers to wait despite multiple discussions. Initially given IV antibiotics with transition to oral medications. Discharged after 7 days.   He presents today for his initial visit with a chief complaint of  Review of Systems    Physical Exam    Assessment & Plan:  1: Chronic heart failure with reduced ejection fraction- - NYHA class - BNP 10/03/20 was 815.9  2: DM- - BMP 02/18/21 reviewed and showed sodium 137,  potassium 4.5, creatinine 0.83 and GFR >60 - A1c 02/13/21 was 11.9%  3: Substance use-

## 2021-02-24 ENCOUNTER — Telehealth: Payer: Self-pay | Admitting: Family

## 2021-02-24 ENCOUNTER — Ambulatory Visit: Payer: Medicaid Other | Admitting: Family

## 2021-02-24 NOTE — Telephone Encounter (Signed)
Patient did not show for his [facility] Failure Clinic appointment on 02/24/21. Will attempt to reschedule.

## 2021-02-24 NOTE — Telephone Encounter (Signed)
Called twice in attempt to reach patient to follow up since hosptial discharge and was able to reach. Patient was also a no show to his CHF Clinic appointment today with [place].   Shahed Yeoman, NT

## 2021-03-04 ENCOUNTER — Inpatient Hospital Stay: Payer: Medicaid Other | Admitting: Infectious Diseases

## 2021-03-11 ENCOUNTER — Emergency Department: Payer: Medicaid Other

## 2021-03-11 ENCOUNTER — Inpatient Hospital Stay: Payer: Medicaid Other

## 2021-03-11 ENCOUNTER — Inpatient Hospital Stay
Admission: EM | Admit: 2021-03-11 | Discharge: 2021-03-14 | DRG: 871 | Disposition: A | Discharge status: Home / Self Care | Payer: Medicaid Other | Attending: Internal Medicine | Admitting: Internal Medicine

## 2021-03-11 ENCOUNTER — Other Ambulatory Visit: Payer: Self-pay

## 2021-03-11 ENCOUNTER — Encounter: Payer: Self-pay | Admitting: Emergency Medicine

## 2021-03-11 DIAGNOSIS — R739 Hyperglycemia, unspecified: Secondary | ICD-10-CM

## 2021-03-11 DIAGNOSIS — F17200 Nicotine dependence, unspecified, uncomplicated: Secondary | ICD-10-CM | POA: Diagnosis present

## 2021-03-11 DIAGNOSIS — Z720 Tobacco use: Secondary | ICD-10-CM | POA: Diagnosis not present

## 2021-03-11 DIAGNOSIS — E119 Type 2 diabetes mellitus without complications: Secondary | ICD-10-CM | POA: Diagnosis present

## 2021-03-11 DIAGNOSIS — Z7984 Long term (current) use of oral hypoglycemic drugs: Secondary | ICD-10-CM

## 2021-03-11 DIAGNOSIS — Z8249 Family history of ischemic heart disease and other diseases of the circulatory system: Secondary | ICD-10-CM

## 2021-03-11 DIAGNOSIS — Z8701 Personal history of pneumonia (recurrent): Secondary | ICD-10-CM

## 2021-03-11 DIAGNOSIS — Z794 Long term (current) use of insulin: Secondary | ICD-10-CM

## 2021-03-11 DIAGNOSIS — Z79899 Other long term (current) drug therapy: Secondary | ICD-10-CM

## 2021-03-11 DIAGNOSIS — A419 Sepsis, unspecified organism: Secondary | ICD-10-CM | POA: Diagnosis present

## 2021-03-11 DIAGNOSIS — F1410 Cocaine abuse, uncomplicated: Secondary | ICD-10-CM | POA: Diagnosis present

## 2021-03-11 DIAGNOSIS — E1165 Type 2 diabetes mellitus with hyperglycemia: Secondary | ICD-10-CM | POA: Diagnosis present

## 2021-03-11 DIAGNOSIS — D649 Anemia, unspecified: Secondary | ICD-10-CM | POA: Diagnosis present

## 2021-03-11 DIAGNOSIS — I10 Essential (primary) hypertension: Secondary | ICD-10-CM | POA: Diagnosis not present

## 2021-03-11 DIAGNOSIS — Z955 Presence of coronary angioplasty implant and graft: Secondary | ICD-10-CM

## 2021-03-11 DIAGNOSIS — E1142 Type 2 diabetes mellitus with diabetic polyneuropathy: Secondary | ICD-10-CM | POA: Diagnosis present

## 2021-03-11 DIAGNOSIS — Z5900 Homelessness unspecified: Secondary | ICD-10-CM

## 2021-03-11 DIAGNOSIS — R079 Chest pain, unspecified: Secondary | ICD-10-CM

## 2021-03-11 DIAGNOSIS — I5042 Chronic combined systolic (congestive) and diastolic (congestive) heart failure: Secondary | ICD-10-CM

## 2021-03-11 DIAGNOSIS — Z809 Family history of malignant neoplasm, unspecified: Secondary | ICD-10-CM

## 2021-03-11 DIAGNOSIS — Z20822 Contact with and (suspected) exposure to covid-19: Secondary | ICD-10-CM | POA: Diagnosis present

## 2021-03-11 DIAGNOSIS — Y95 Nosocomial condition: Secondary | ICD-10-CM | POA: Diagnosis present

## 2021-03-11 DIAGNOSIS — J189 Pneumonia, unspecified organism: Secondary | ICD-10-CM | POA: Diagnosis present

## 2021-03-11 DIAGNOSIS — I2510 Atherosclerotic heart disease of native coronary artery without angina pectoris: Secondary | ICD-10-CM | POA: Diagnosis present

## 2021-03-11 DIAGNOSIS — M86271 Subacute osteomyelitis, right ankle and foot: Secondary | ICD-10-CM | POA: Diagnosis present

## 2021-03-11 DIAGNOSIS — I252 Old myocardial infarction: Secondary | ICD-10-CM | POA: Diagnosis not present

## 2021-03-11 DIAGNOSIS — E118 Type 2 diabetes mellitus with unspecified complications: Secondary | ICD-10-CM

## 2021-03-11 DIAGNOSIS — M869 Osteomyelitis, unspecified: Secondary | ICD-10-CM

## 2021-03-11 DIAGNOSIS — F1910 Other psychoactive substance abuse, uncomplicated: Secondary | ICD-10-CM

## 2021-03-11 DIAGNOSIS — I5022 Chronic systolic (congestive) heart failure: Secondary | ICD-10-CM | POA: Diagnosis present

## 2021-03-11 DIAGNOSIS — F1210 Cannabis abuse, uncomplicated: Secondary | ICD-10-CM | POA: Diagnosis present

## 2021-03-11 DIAGNOSIS — E1169 Type 2 diabetes mellitus with other specified complication: Secondary | ICD-10-CM | POA: Diagnosis present

## 2021-03-11 DIAGNOSIS — I110 Hypertensive heart disease with heart failure: Secondary | ICD-10-CM | POA: Diagnosis present

## 2021-03-11 DIAGNOSIS — R7989 Other specified abnormal findings of blood chemistry: Secondary | ICD-10-CM

## 2021-03-11 LAB — COMPREHENSIVE METABOLIC PANEL
ALT: 12 U/L (ref 0–44)
AST: 17 U/L (ref 15–41)
Albumin: 3.1 g/dL — ABNORMAL LOW (ref 3.5–5.0)
Alkaline Phosphatase: 76 U/L (ref 38–126)
Anion gap: 9 (ref 5–15)
BUN: 12 mg/dL (ref 6–20)
CO2: 22 mmol/L (ref 22–32)
Calcium: 8.2 mg/dL — ABNORMAL LOW (ref 8.9–10.3)
Chloride: 99 mmol/L (ref 98–111)
Creatinine, Ser: 0.93 mg/dL (ref 0.61–1.24)
GFR, Estimated: >60 mL/min (ref >60)
Glucose, Bld: 363 mg/dL — ABNORMAL HIGH (ref 70–99)
Potassium: 4.3 mmol/L (ref 3.5–5.1)
Sodium: 130 mmol/L — ABNORMAL LOW (ref 135–145)
Total Bilirubin: 0.8 mg/dL (ref 0.3–1.2)
Total Protein: 6.9 g/dL (ref 6.5–8.1)

## 2021-03-11 LAB — URINE DRUG SCREEN, QUALITATIVE (ARMC ONLY)
Amphetamines, Ur Screen: NOT DETECTED
Barbiturates, Ur Screen: NOT DETECTED
Benzodiazepine, Ur Scrn: NOT DETECTED
Cannabinoid 50 Ng, Ur ~~LOC~~: POSITIVE — AB
Cocaine Metabolite,Ur ~~LOC~~: POSITIVE — AB
MDMA (Ecstasy)Ur Screen: NOT DETECTED
Methadone Scn, Ur: NOT DETECTED
Opiate, Ur Screen: NOT DETECTED
Phencyclidine (PCP) Ur S: NOT DETECTED
Tricyclic, Ur Screen: NOT DETECTED

## 2021-03-11 LAB — URINALYSIS, COMPLETE (UACMP) WITH MICROSCOPIC
Bacteria, UA: NONE SEEN
Bilirubin Urine: NEGATIVE
Glucose, UA: >=500 mg/dL — AB
Hgb urine dipstick: NEGATIVE
Ketones, ur: NEGATIVE mg/dL
Leukocytes,Ua: NEGATIVE
Nitrite: NEGATIVE
Protein, ur: NEGATIVE mg/dL
Specific Gravity, Urine: 1.028 (ref 1.005–1.030)
pH: 6 (ref 5.0–8.0)

## 2021-03-11 LAB — TROPONIN I (HIGH SENSITIVITY)
Troponin I (High Sensitivity): 6 ng/L (ref <18)
Troponin I (High Sensitivity): 7 ng/L (ref <18)

## 2021-03-11 LAB — CBC
HCT: 35 % — ABNORMAL LOW (ref 39.0–52.0)
Hemoglobin: 11 g/dL — ABNORMAL LOW (ref 13.0–17.0)
MCH: 26.4 pg (ref 26.0–34.0)
MCHC: 31.4 g/dL (ref 30.0–36.0)
MCV: 84.1 fL (ref 80.0–100.0)
Platelets: 255 10*3/uL (ref 150–400)
RBC: 4.16 MIL/uL — ABNORMAL LOW (ref 4.22–5.81)
RDW: 15.3 % (ref 11.5–15.5)
WBC: 13.2 10*3/uL — ABNORMAL HIGH (ref 4.0–10.5)
nRBC: 0 % (ref 0.0–0.2)

## 2021-03-11 LAB — HIV ANTIBODY (ROUTINE TESTING W REFLEX): HIV Screen 4th Generation wRfx: NONREACTIVE

## 2021-03-11 LAB — EXPECTORATED SPUTUM ASSESSMENT W GRAM STAIN, RFLX TO RESP C

## 2021-03-11 LAB — PROCALCITONIN: Procalcitonin: 0.21 ng/mL

## 2021-03-11 LAB — HEMOGLOBIN A1C
Hgb A1c MFr Bld: 11.2 % — ABNORMAL HIGH (ref 4.8–5.6)
Mean Plasma Glucose: 274.74 mg/dL

## 2021-03-11 LAB — C-REACTIVE PROTEIN: CRP: 21.5 mg/dL — ABNORMAL HIGH (ref <1.0)

## 2021-03-11 LAB — D-DIMER, QUANTITATIVE: D-Dimer, Quant: 0.75 ug/mL-FEU — ABNORMAL HIGH (ref 0.00–0.50)

## 2021-03-11 LAB — RESP PANEL BY RT-PCR (FLU A&B, COVID) ARPGX2
Influenza A by PCR: NEGATIVE
Influenza B by PCR: NEGATIVE
SARS Coronavirus 2 by RT PCR: NEGATIVE

## 2021-03-11 LAB — SEDIMENTATION RATE: Sed Rate: 69 mm/hr — ABNORMAL HIGH (ref 0–15)

## 2021-03-11 LAB — APTT: aPTT: 41 seconds — ABNORMAL HIGH (ref 24–36)

## 2021-03-11 LAB — LACTIC ACID, PLASMA: Lactic Acid, Venous: 1.3 mmol/L (ref 0.5–1.9)

## 2021-03-11 LAB — CBG MONITORING, ED
Glucose-Capillary: 181 mg/dL — ABNORMAL HIGH (ref 70–99)
Glucose-Capillary: 159 mg/dL — ABNORMAL HIGH (ref 70–99)
Glucose-Capillary: 201 mg/dL — ABNORMAL HIGH (ref 70–99)

## 2021-03-11 LAB — STREP PNEUMONIAE URINARY ANTIGEN: Strep Pneumo Urinary Antigen: NEGATIVE

## 2021-03-11 LAB — PROTIME-INR
INR: 1.2 (ref 0.8–1.2)
Prothrombin Time: 14.6 seconds (ref 11.4–15.2)

## 2021-03-11 LAB — BRAIN NATRIURETIC PEPTIDE: B Natriuretic Peptide: 173.8 pg/mL — ABNORMAL HIGH (ref 0.0–100.0)

## 2021-03-11 IMAGING — CR DG CHEST 2V
2 series · 2 of 2 positions shown · non-contrast
Comparison: [DATE]

CLINICAL DATA: Chest pain

EXAM:
CHEST - 2 VIEW

[chest pa]
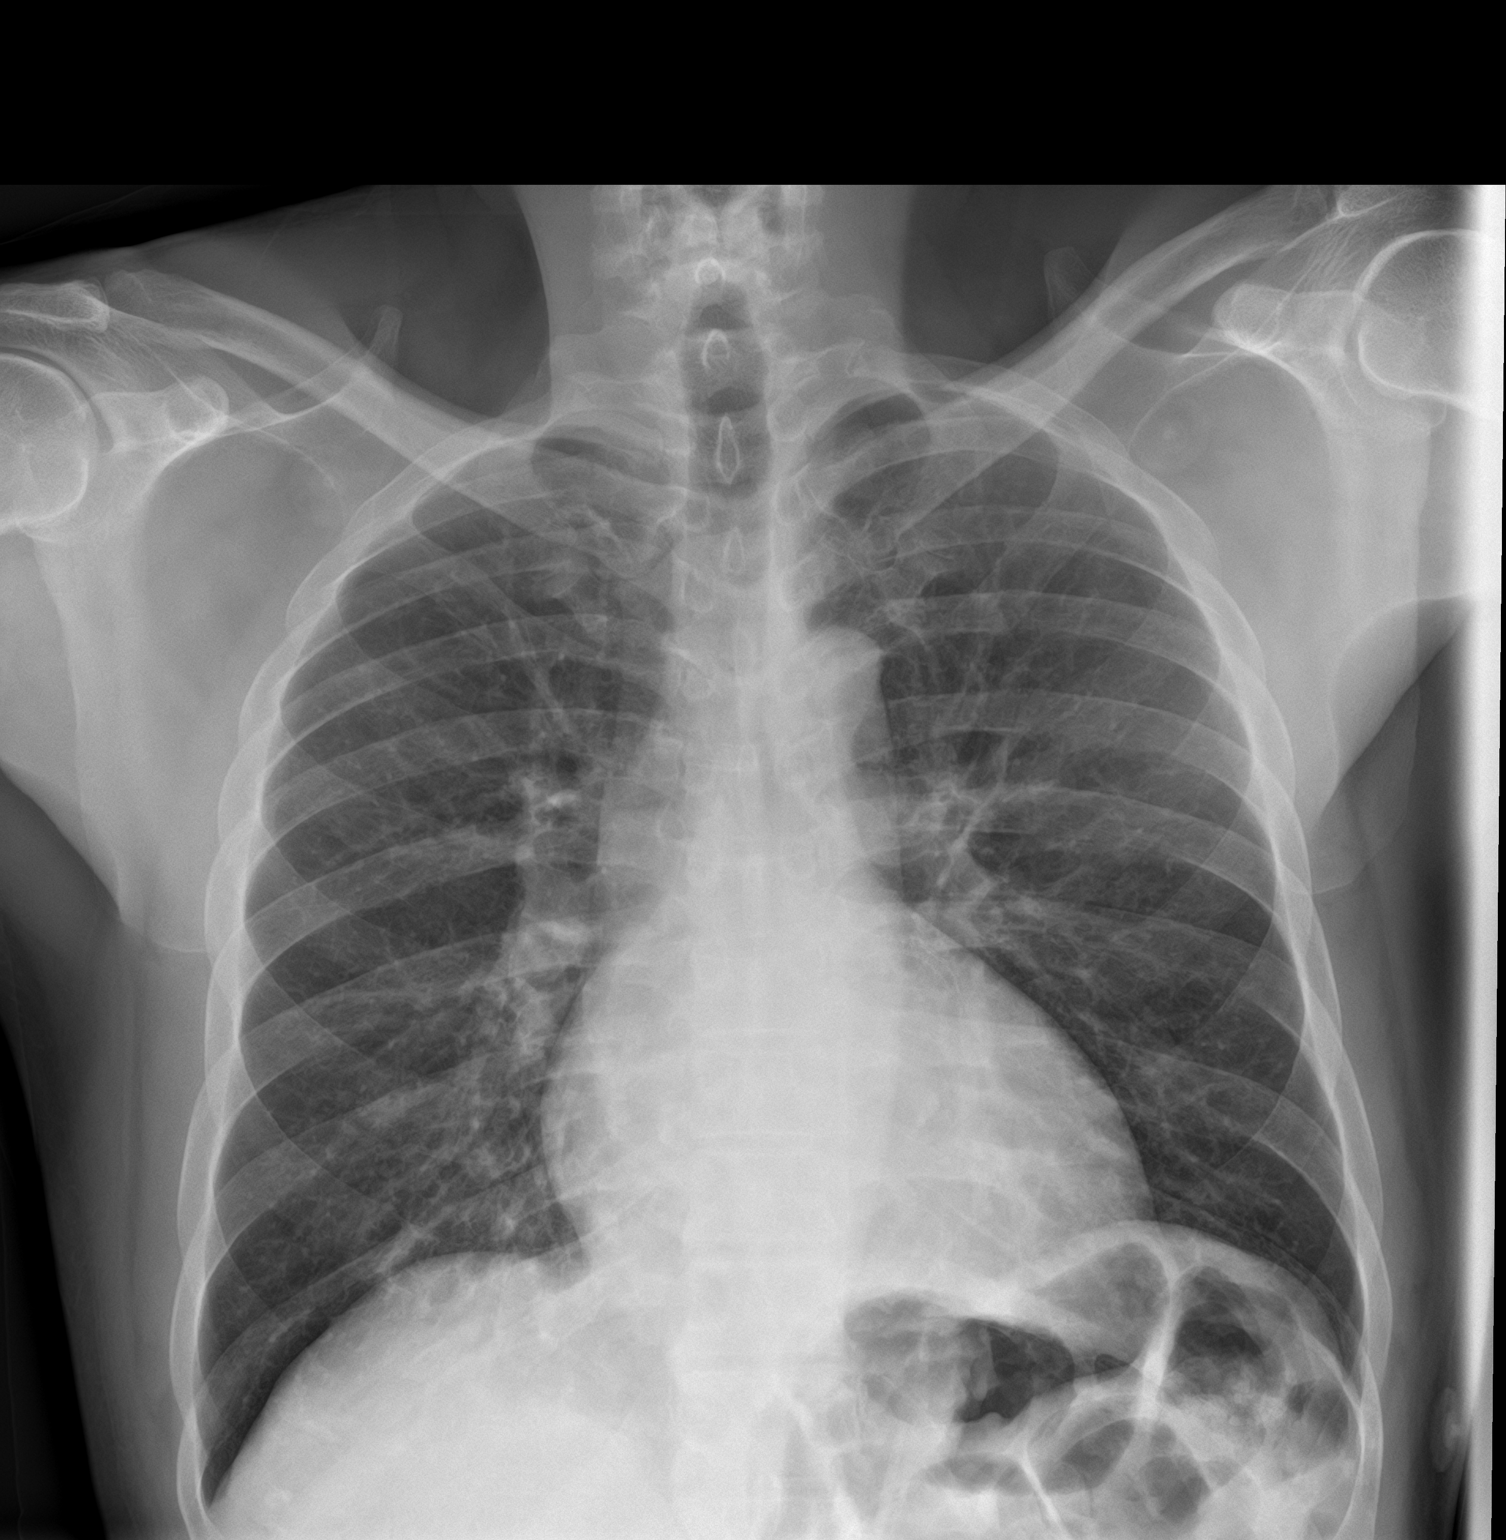

[chest lat]
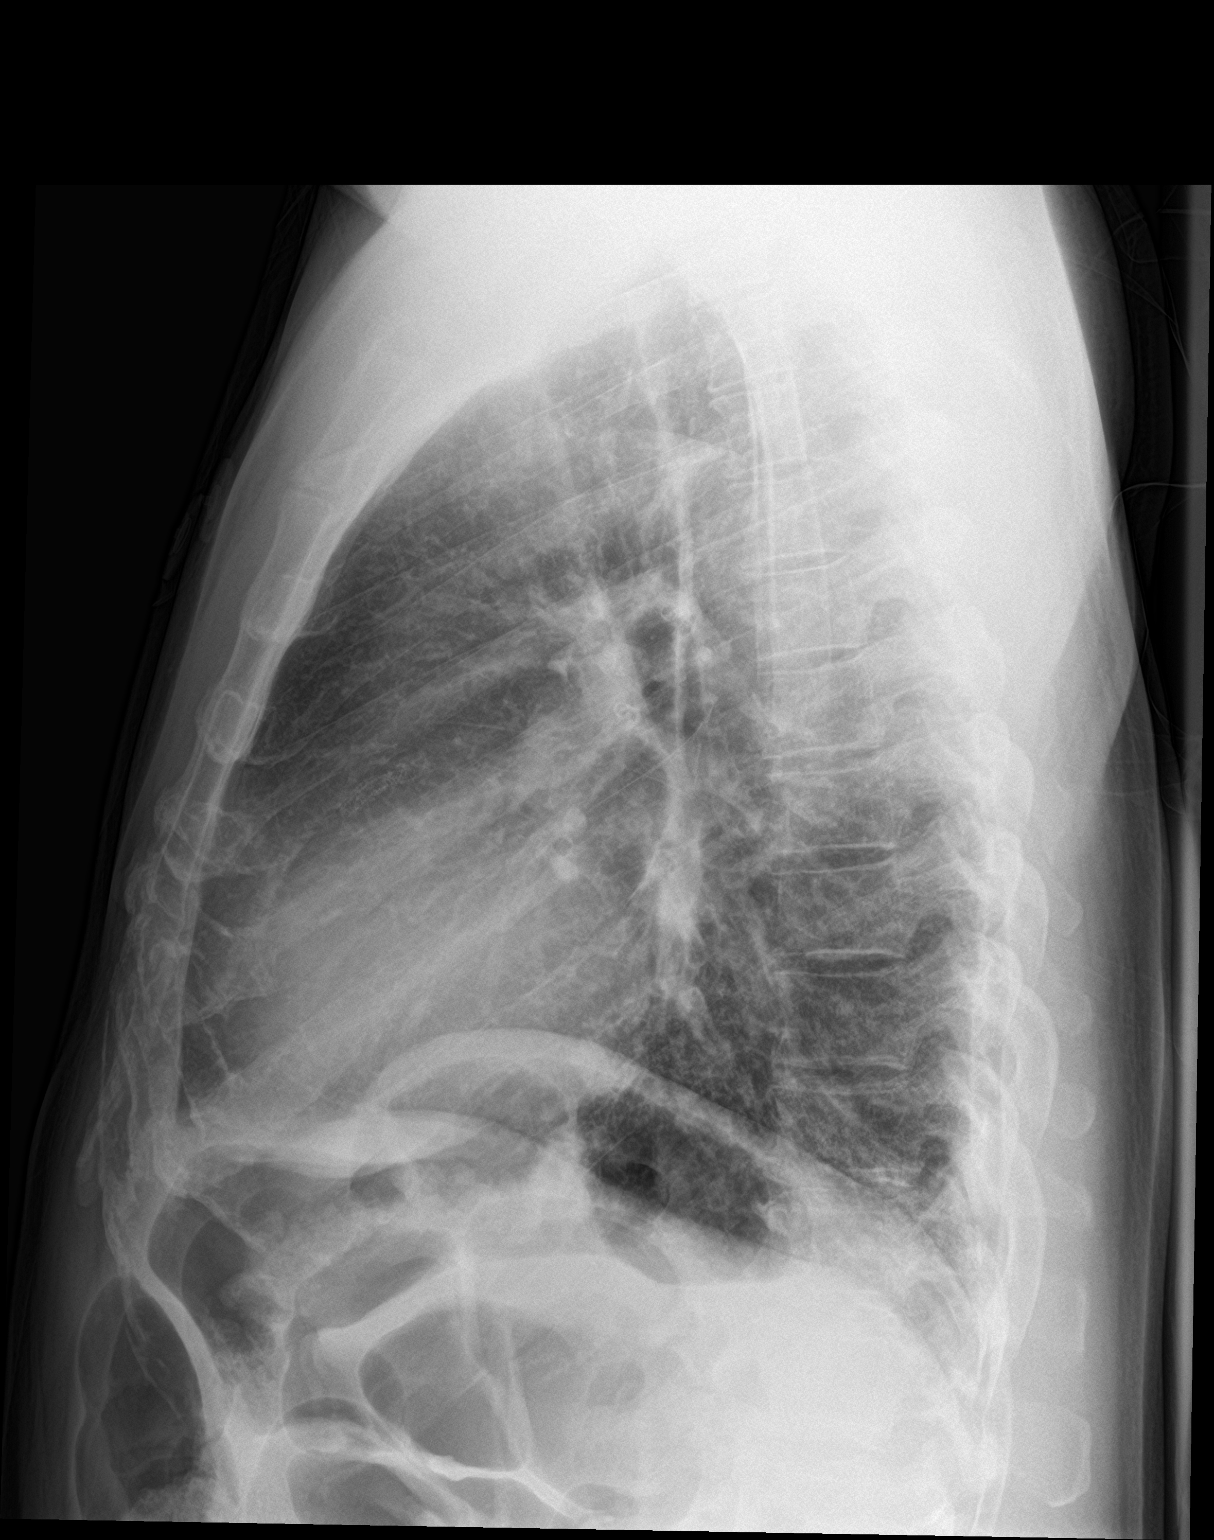

[2 of 2 positions shown; findings below may reference images not displayed]

FINDINGS: The heart size and mediastinal contours are within normal limits.
Both lungs are clear. The visualized skeletal structures are
unremarkable.
IMPRESSION: No acute abnormality of the lungs.

## 2021-03-11 IMAGING — CT CT ANGIO CHEST
2 of 6 series · 18 of 46 positions shown · IV contrast (APPLIED)
Comparison: Chest radiograph [DATE]

CLINICAL DATA: Chest pain

EXAM:
CT ANGIOGRAPHY CHEST WITH CONTRAST
TECHNIQUE: Multidetector CT imaging of the chest was performed using the
standard protocol during bolus administration of intravenous
contrast. Multiplanar CT image reconstructions and MIPs were
obtained to evaluate the vascular anatomy.
CONTRAST:  75mL OMNIPAQUE IOHEXOL 350 MG/ML SOLN

[Series 5: thins · axial · 0.73mm/px · z∈[-946,-681]mm · 16 of 291 slices shown]
[im 13/291  lung]
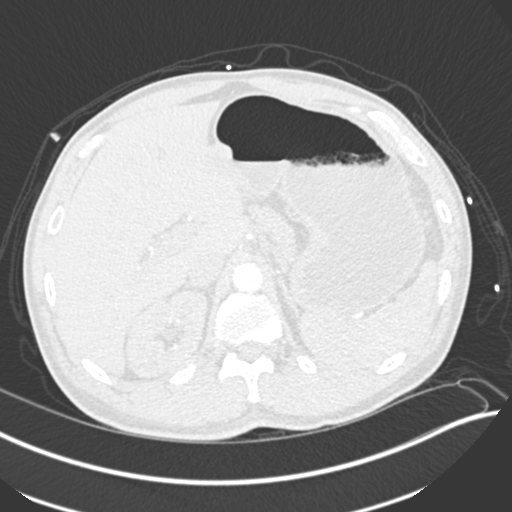
[im 38/291  soft-tissue]
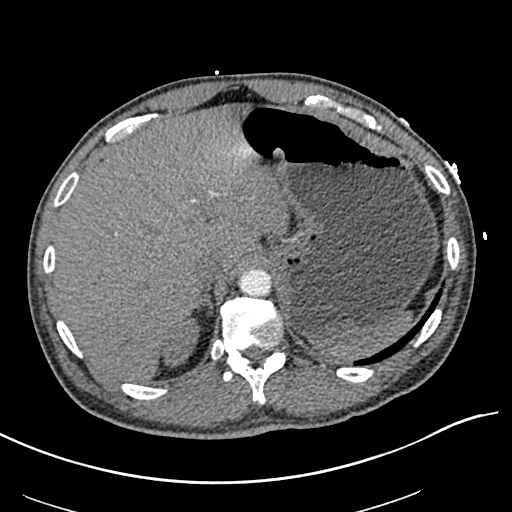
[im 51/291  lung]
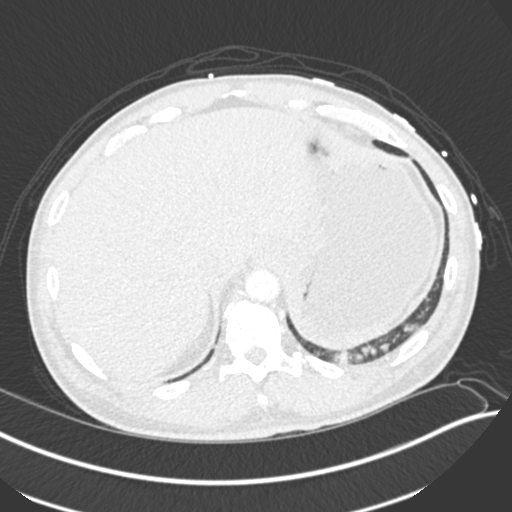
[im 64/291  soft-tissue]
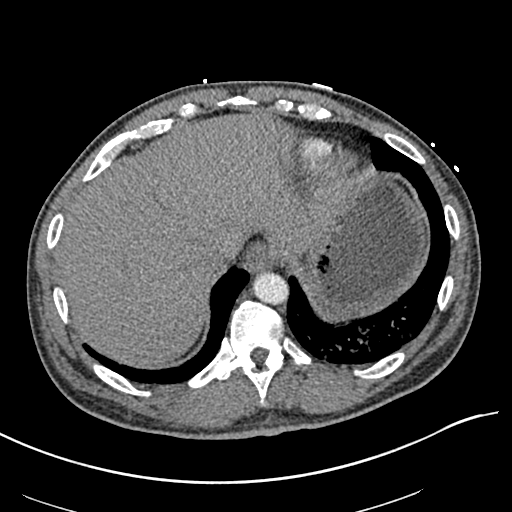
[im 89/291  lung]
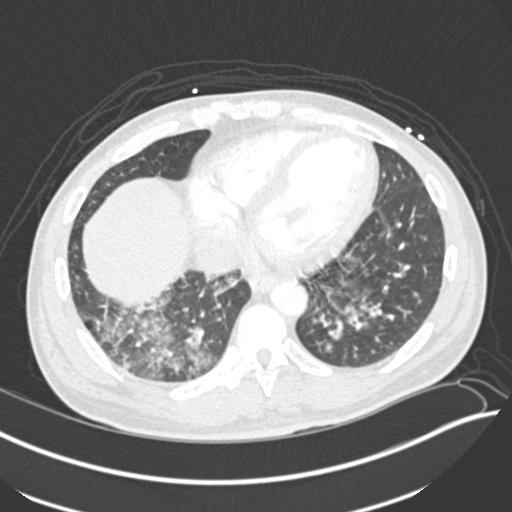
[im 101/291  soft-tissue]
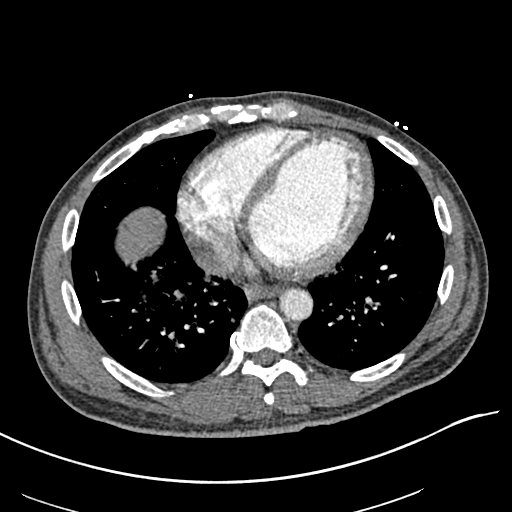
[im 114/291  lung]
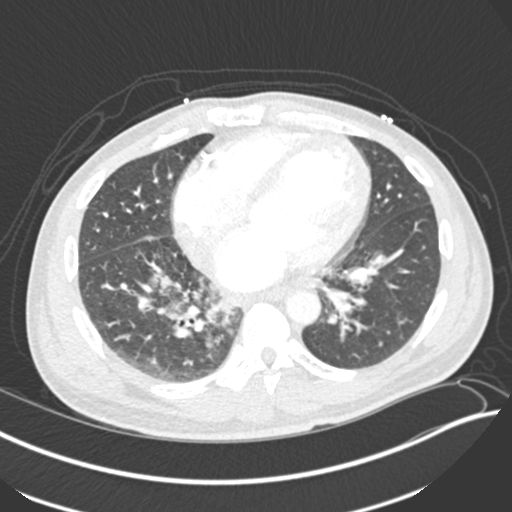
[im 139/291  soft-tissue]
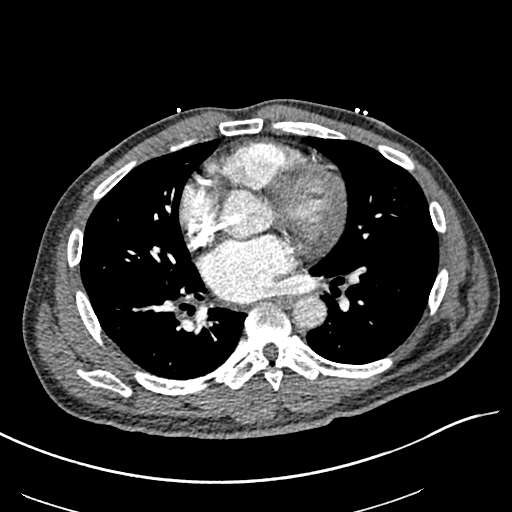
[im 152/291  lung]
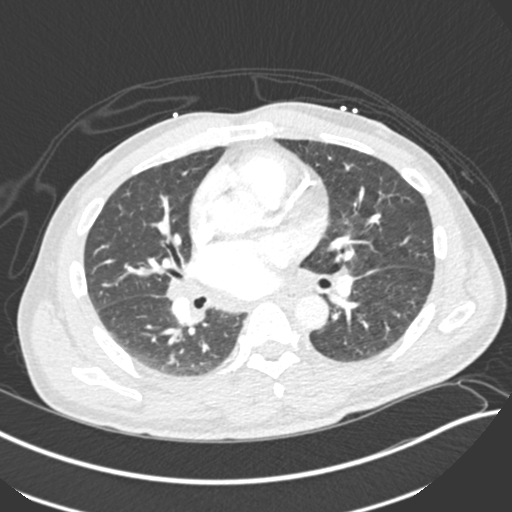
[im 177/291  soft-tissue]
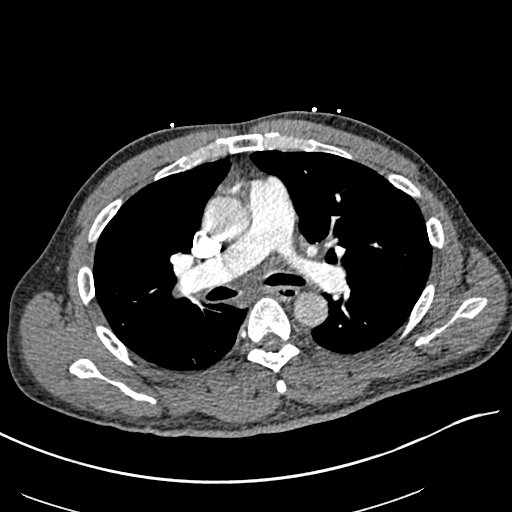
[im 190/291  lung]
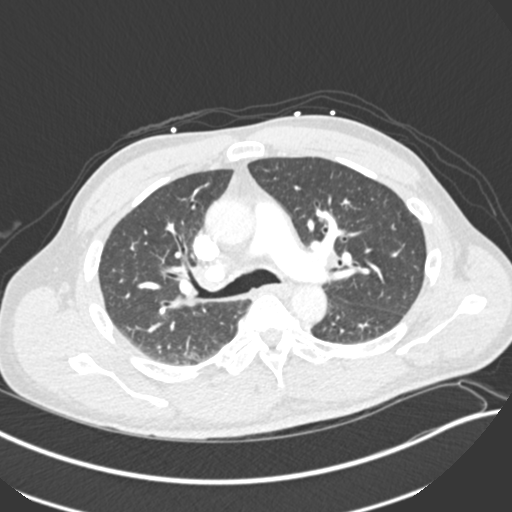
[im 202/291  soft-tissue]
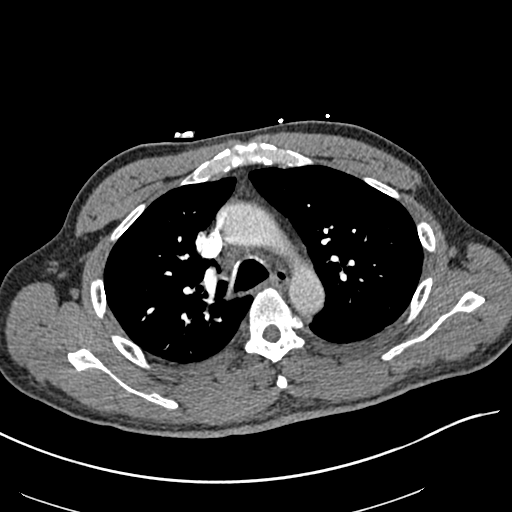
[im 227/291  lung]
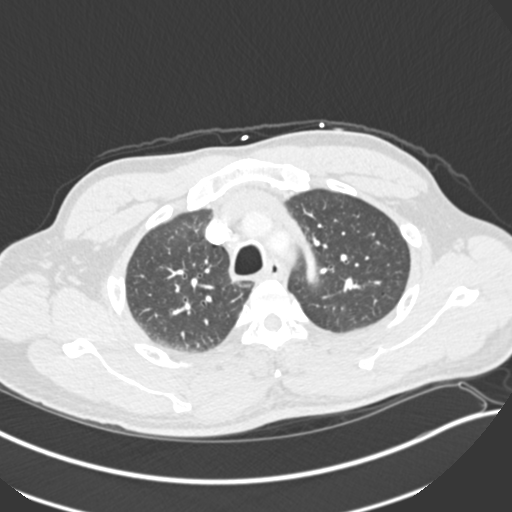
[im 240/291  soft-tissue]
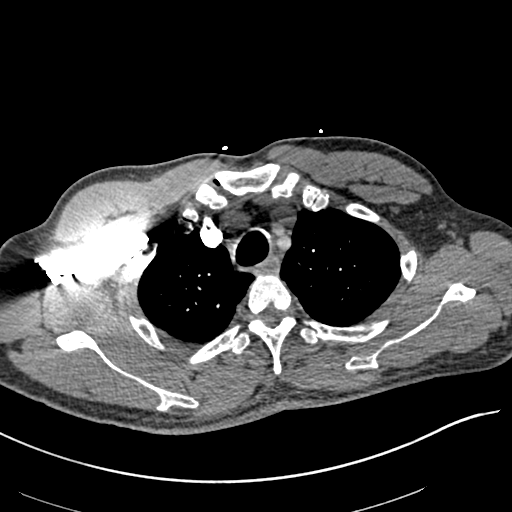
[im 253/291  lung]
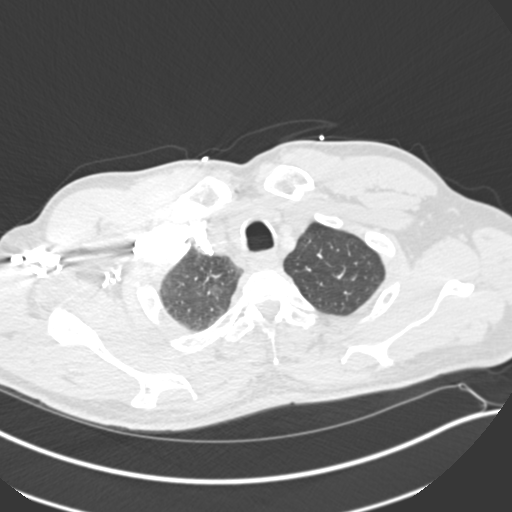
[im 278/291  soft-tissue]
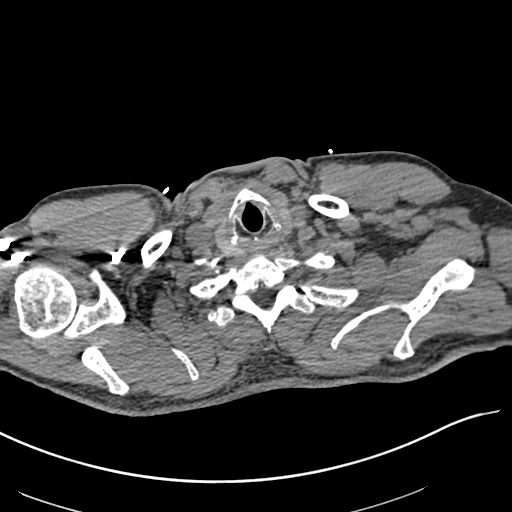

[Series 7: coronal mpr · coronal · 0.59mm/px · 2 of 80 slices shown]
[im 27/80  soft-tissue]
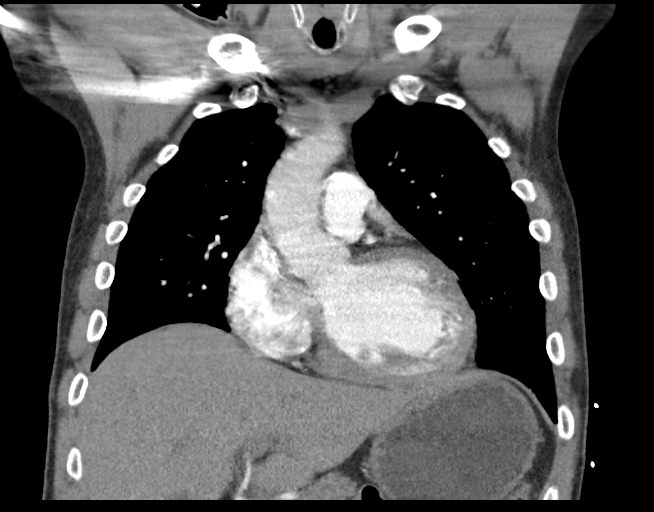
[im 53/80  soft-tissue]
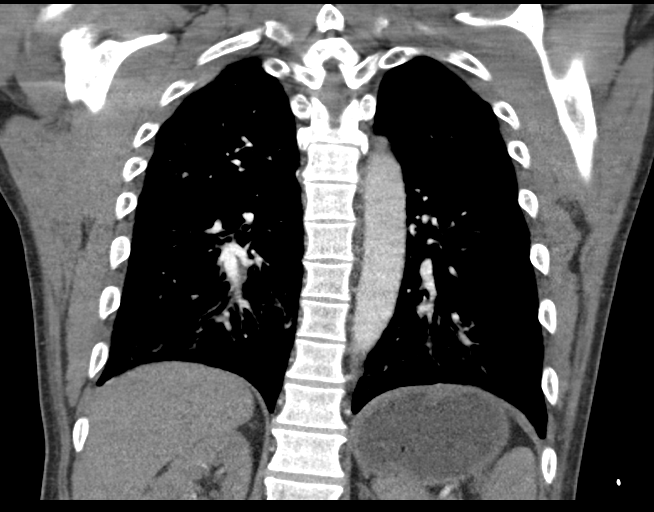

[18 of 46 positions shown; findings below may reference images not displayed]

FINDINGS: Cardiovascular: There is no demonstrable pulmonary embolus. There is
no thoracic aortic aneurysm or dissection. Visualized great vessels
appear normal. Right innominate and left common carotid arteries
arise as a common trunk, there is calcification in multiple coronary
arteries. There is no pericardial effusion or pericardial
thickening.

Mediastinum/Nodes: Thyroid appears normal. No evident thoracic
adenopathy. Small hiatal hernia noted.

Lungs/Pleura: There is ill-defined airspace opacity in portions of
each lower lobe, with airspace opacity in portions of all segments
of the right lower lobe and in portions of the superior segment,
posterior segment, and lateral segment left lower lobe. Upper lobes
and right middle lobe are clear. No pleural effusions are evident.
No pneumothorax. Trachea and major bronchial structures are patent.

Upper Abdomen: Stomach mildly distended with fluid and air.
Visualized upper abdominal structures otherwise appear unremarkable.

Musculoskeletal: No blastic or lytic bone lesions. No evident chest
wall lesions.

Review of the MIP images confirms the above findings.
IMPRESSION: 1. no demonstrable pulmonary embolus. No thoracic aortic aneurysm or
dissection. There are multiple foci of coronary artery
calcification.

2. Areas of pneumonia in each lower lobe, slightly more severe on
the right than the left. No pleural effusions.

3.  Small hiatal hernia.

4.  No evident adenopathy.

## 2021-03-11 IMAGING — US US EXTREM LOW VENOUS
1 series · 13 of 24 positions shown · non-contrast
Comparison: None.

CLINICAL DATA: Elevated D-dimer and leg pain



[Series 1: us venous img lower bilat (dvt) · portal-venous · 13 of 58 slices shown]
[im 1/58]
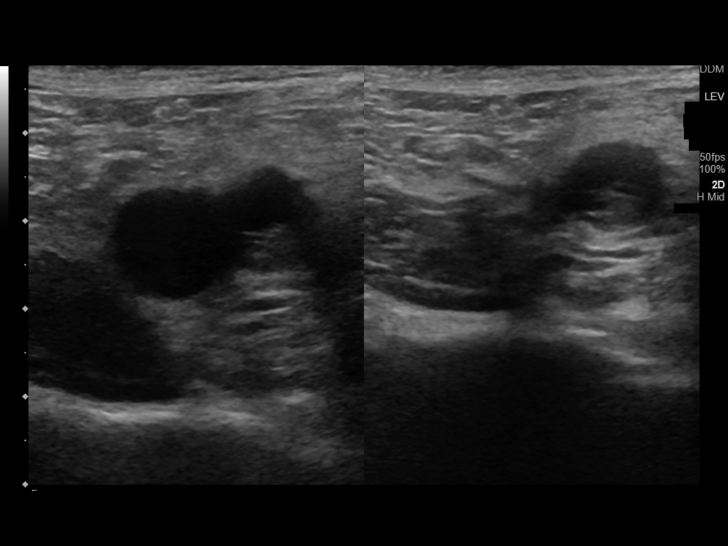
[im 5/58]
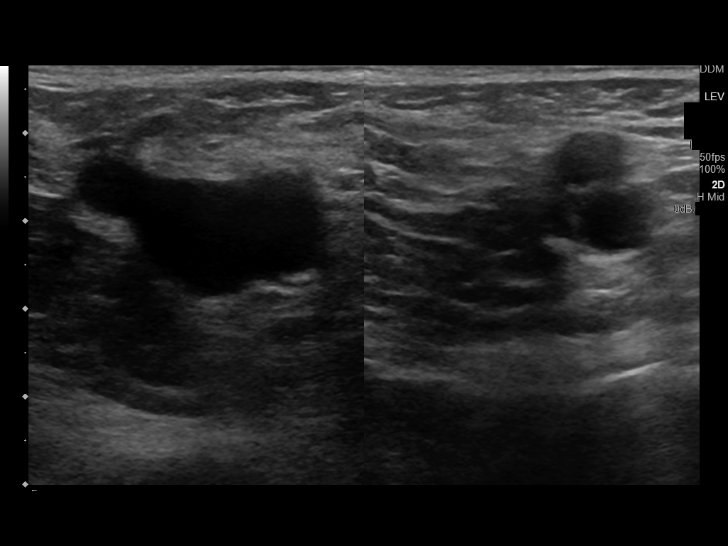
[im 10/58]
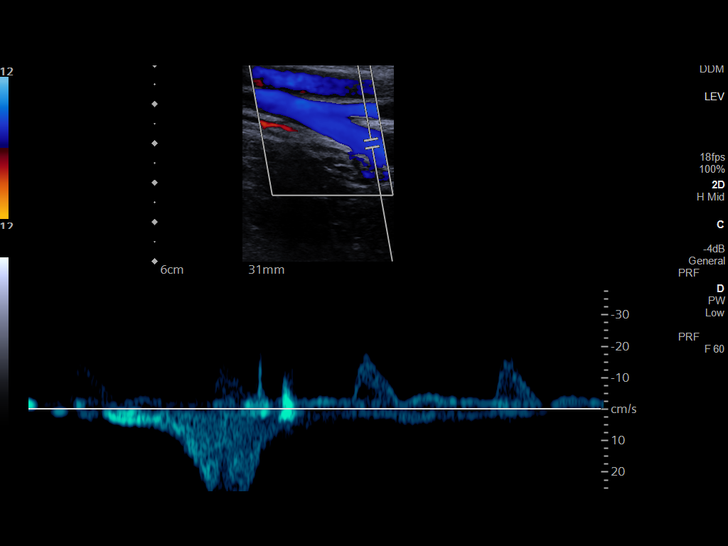
[im 15/58]
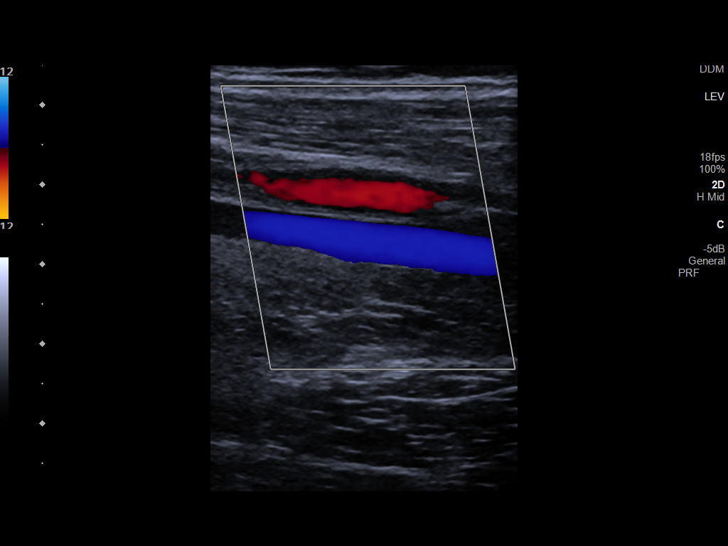
[im 20/58]
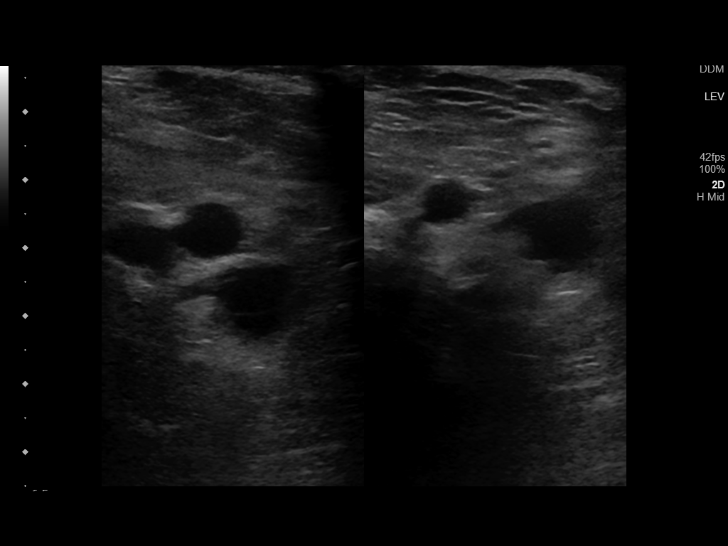
[im 25/58]
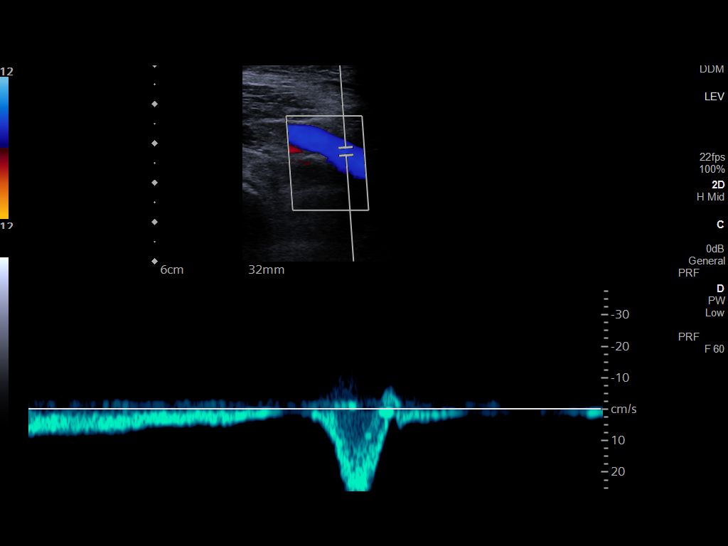
[im 30/58]
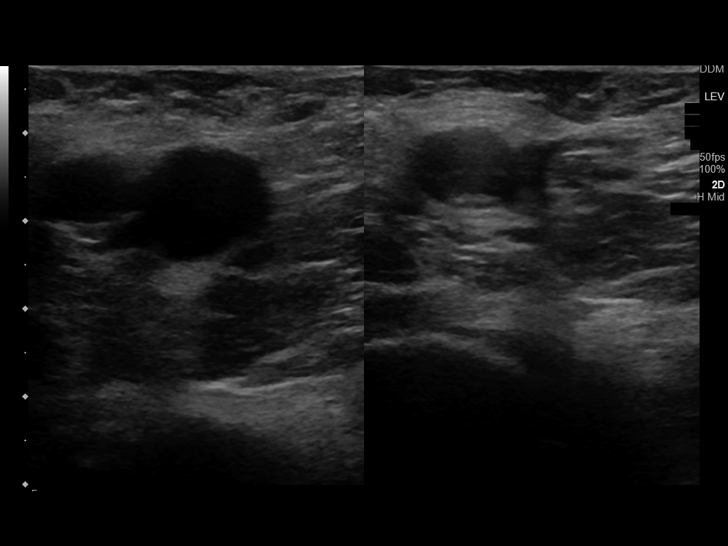
[im 33/58]
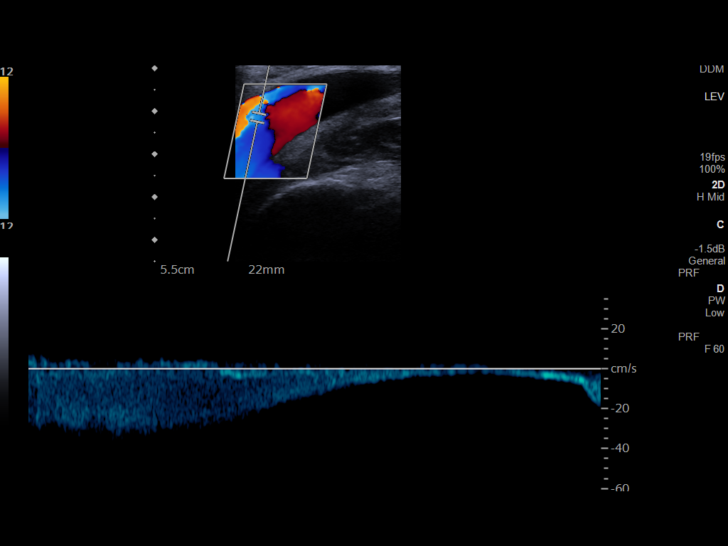
[im 38/58]
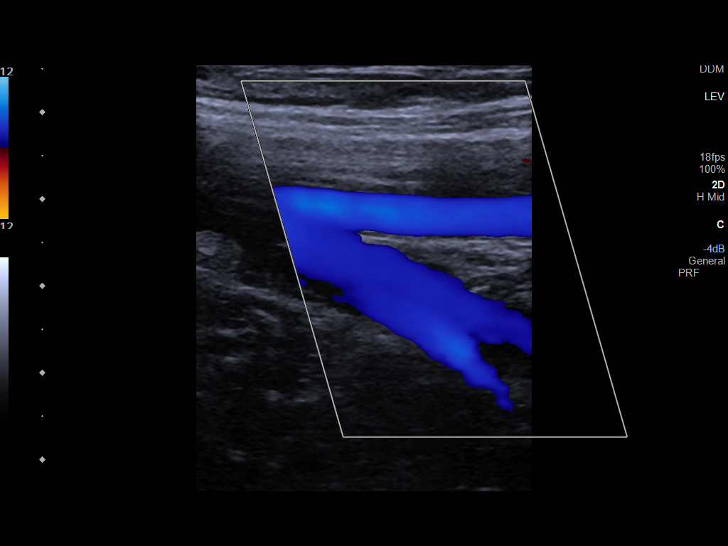
[im 43/58]
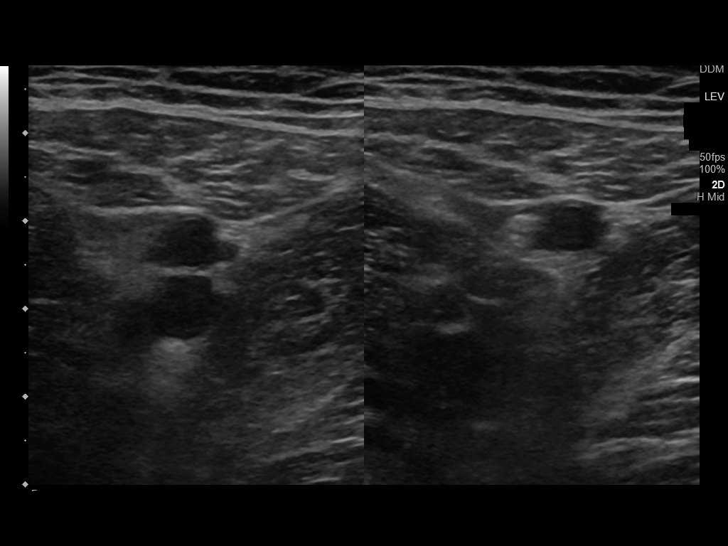
[im 48/58]
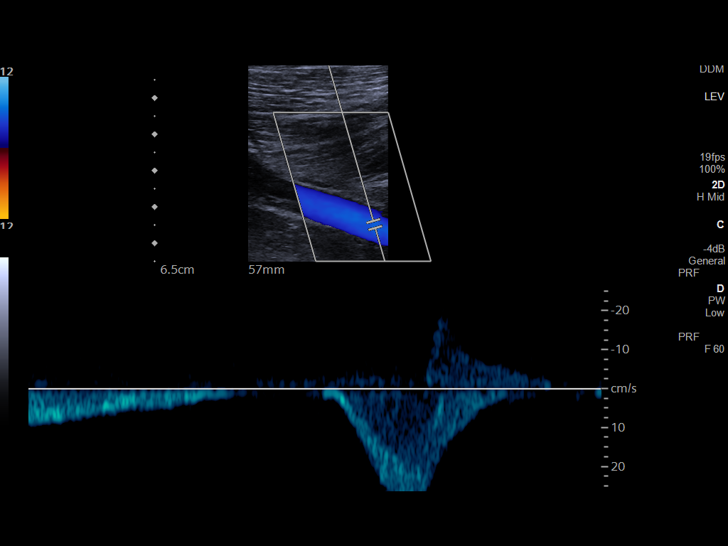
[im 53/58]
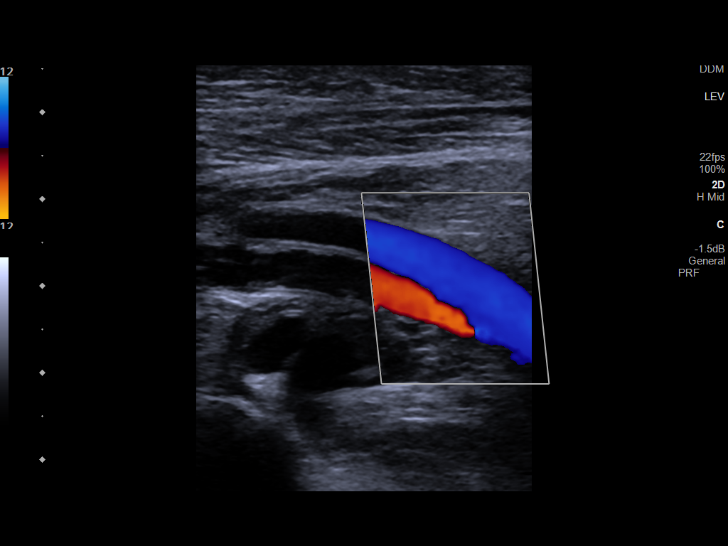
[im 58/58]
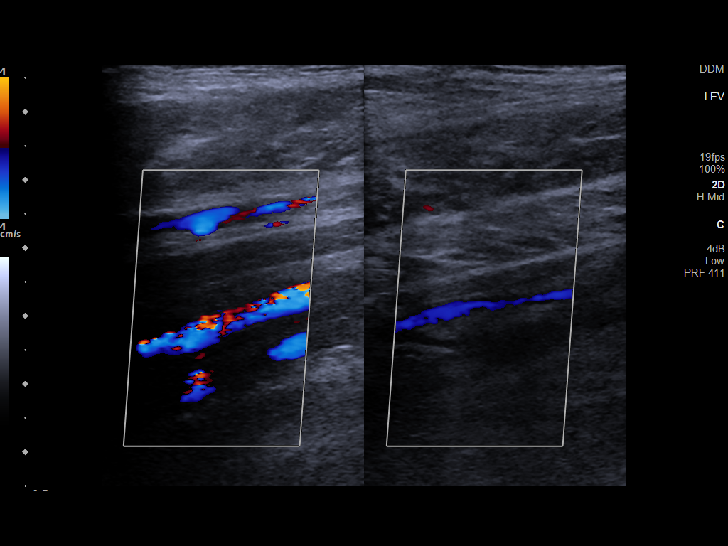

[13 of 24 positions shown; findings below may reference images not displayed]

FINDINGS: RIGHT LOWER EXTREMITY

Common Femoral Vein: No evidence of thrombus. Normal
compressibility, respiratory phasicity and response to augmentation.

Saphenofemoral Junction: No evidence of thrombus. Normal
compressibility and flow on color Doppler imaging.

Profunda Femoral Vein: No evidence of thrombus. Normal
compressibility and flow on color Doppler imaging.

Femoral Vein: No evidence of thrombus. Normal compressibility,
respiratory phasicity and response to augmentation.

Popliteal Vein: No evidence of thrombus. Normal compressibility,
respiratory phasicity and response to augmentation.

Calf Veins: No evidence of thrombus. Normal compressibility and flow
on color Doppler imaging.

Superficial Great Saphenous Vein: No evidence of thrombus. Normal
compressibility.

Venous Reflux:  None.

Other Findings:  None.

LEFT LOWER EXTREMITY

Common Femoral Vein: No evidence of thrombus. Normal
compressibility, respiratory phasicity and response to augmentation.

Saphenofemoral Junction: No evidence of thrombus. Normal
compressibility and flow on color Doppler imaging.

Profunda Femoral Vein: No evidence of thrombus. Normal
compressibility and flow on color Doppler imaging.

Femoral Vein: No evidence of thrombus. Normal compressibility,
respiratory phasicity and response to augmentation.

Popliteal Vein: No evidence of thrombus. Normal compressibility,
respiratory phasicity and response to augmentation.

Calf Veins: No evidence of thrombus. Normal compressibility and flow
on color Doppler imaging.

Superficial Great Saphenous Vein: No evidence of thrombus. Normal
compressibility.

Venous Reflux:  None.

Other Findings:  None.
IMPRESSION: No evidence of deep venous thrombosis in either lower extremity.

## 2021-03-11 IMAGING — DX DG FOOT COMPLETE 3+V*R*
3 series · 3 of 3 positions shown · non-contrast
Comparison: None.

CLINICAL DATA: Foot pain

EXAM:
RIGHT FOOT COMPLETE - 3+ VIEW

[foot ap]
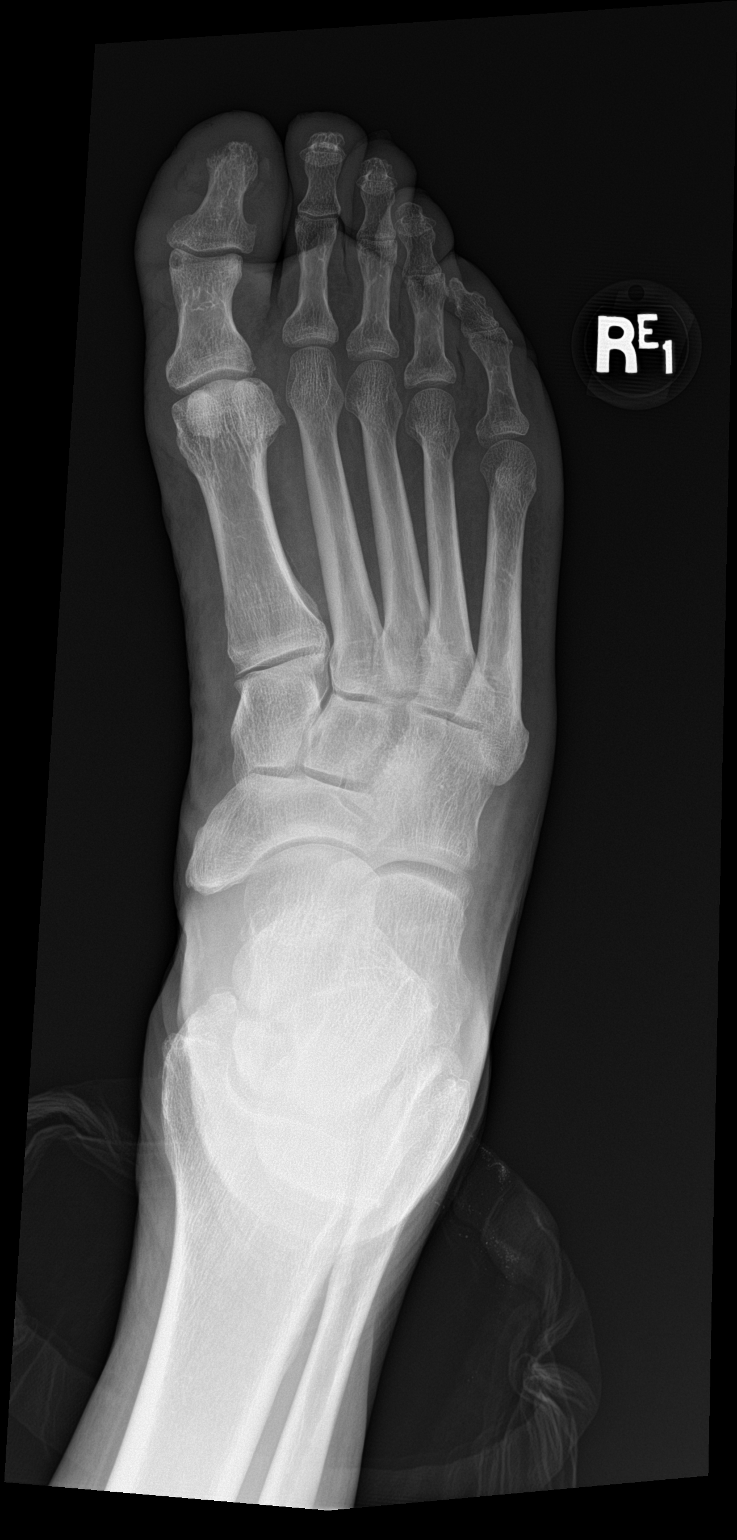

[foot obl]
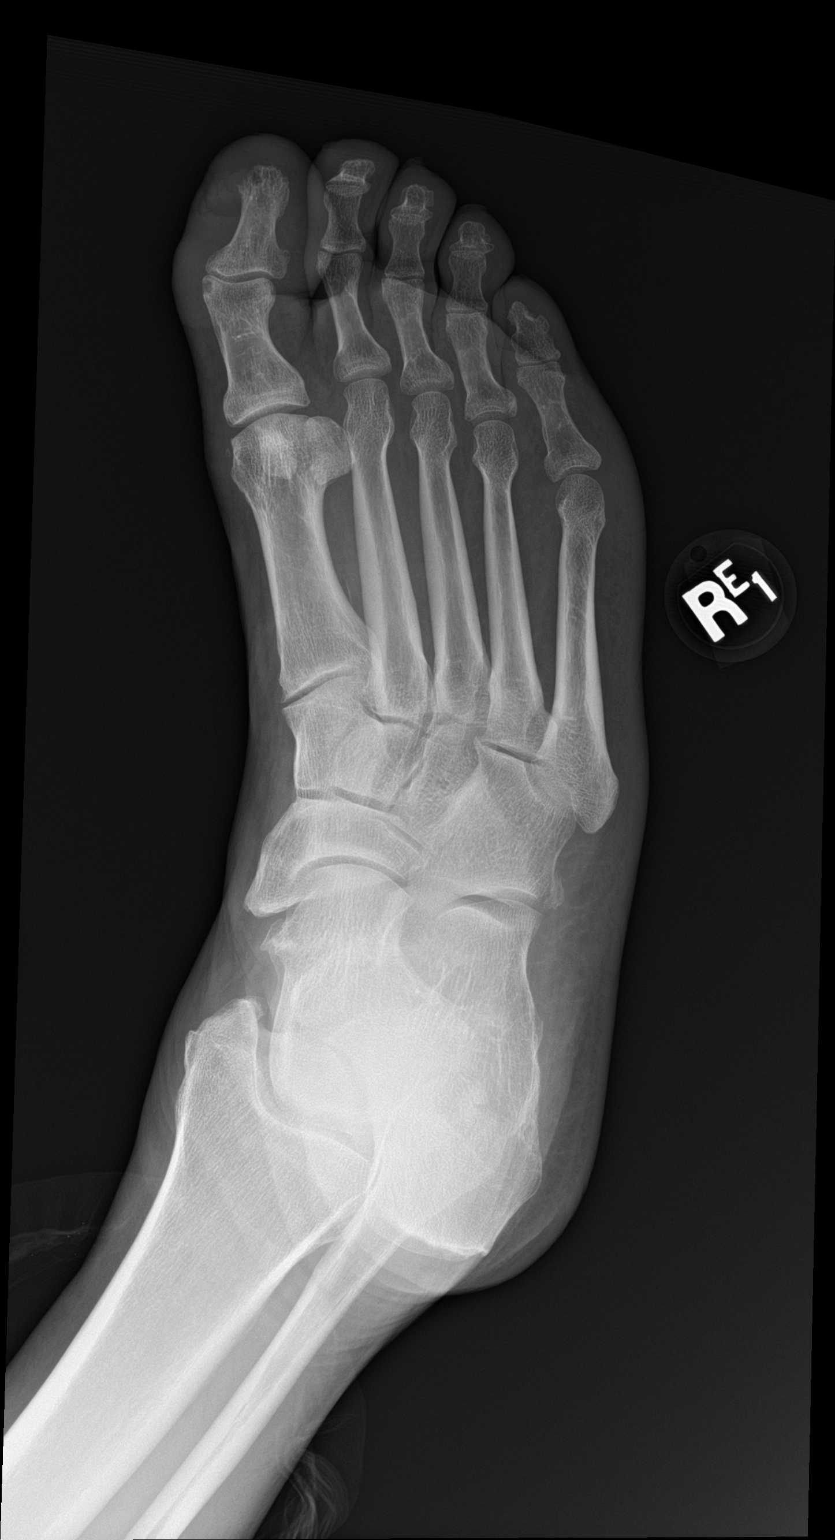

[foot lat]
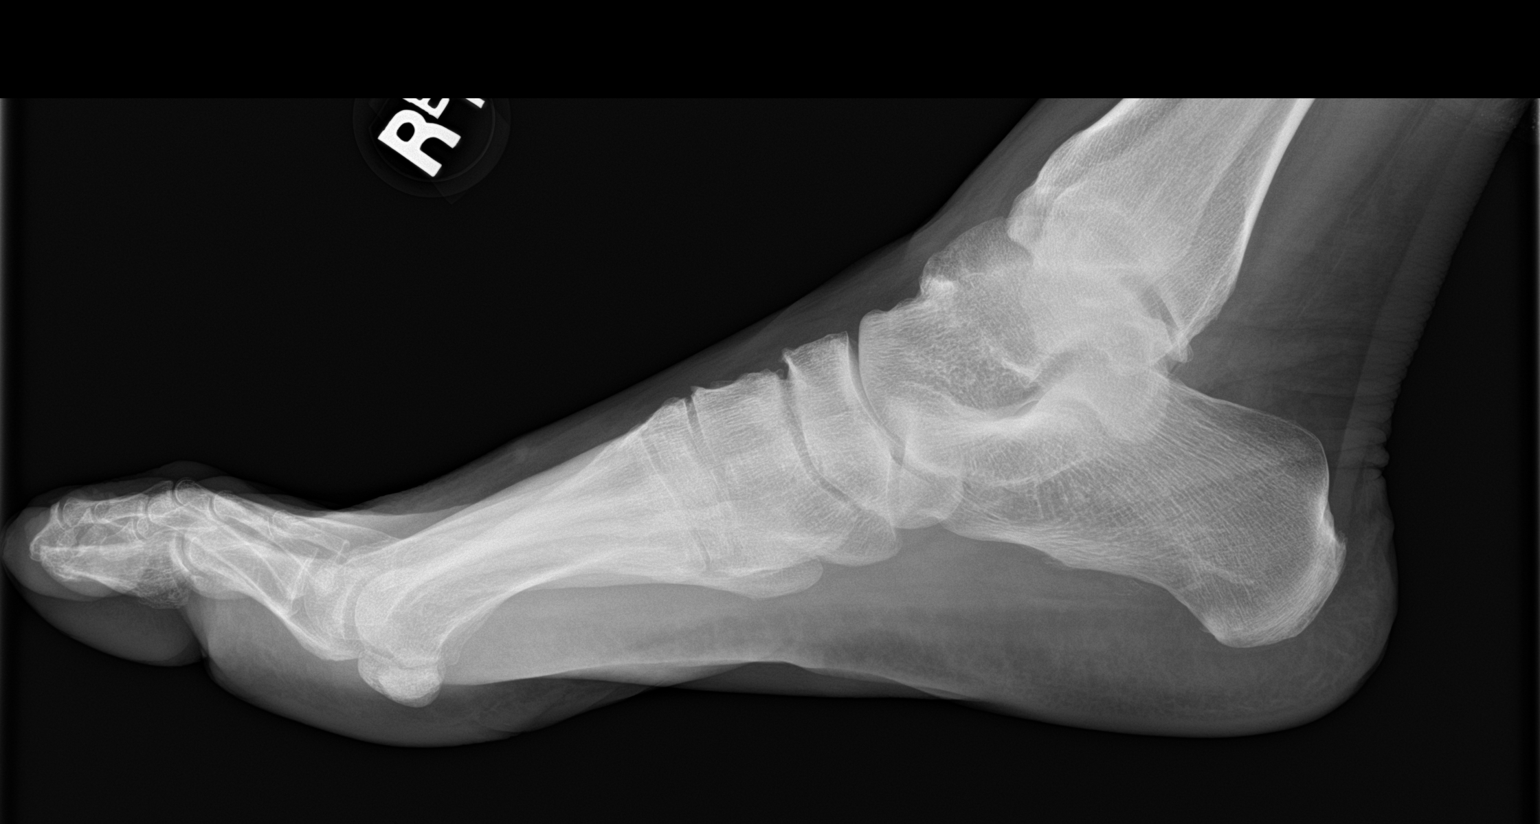

[3 of 3 positions shown; findings below may reference images not displayed]

FINDINGS: No acute fracture or dislocation. Mild irregularity along the distal
medial tuft of the first distal phalanx. Soft tissues are
unremarkable.
IMPRESSION: No acute osseous injury of the right foot.

Mild irregularity along the distal medial tuft of the first distal
phalanx concerning for osteomyelitis.

## 2021-03-11 MED ORDER — INSULIN GLARGINE 100 UNIT/ML ~~LOC~~ SOLN
30.0000 [IU] | Freq: Once | SUBCUTANEOUS | Status: AC
  Administered 2021-03-12: 30 [IU] via SUBCUTANEOUS
  Filled 2021-03-11: qty 0.3

## 2021-03-11 MED ORDER — INSULIN ASPART 100 UNIT/ML ~~LOC~~ SOLN
0.0000 [IU] | Freq: Every day | SUBCUTANEOUS | Status: DC
  Administered 2021-03-11: 2 [IU] via SUBCUTANEOUS
  Administered 2021-03-12: 3 [IU] via SUBCUTANEOUS
  Administered 2021-03-13: 4 [IU] via SUBCUTANEOUS
  Filled 2021-03-11 (×3): qty 1

## 2021-03-11 MED ORDER — INSULIN ASPART 100 UNIT/ML ~~LOC~~ SOLN
0.0000 [IU] | SUBCUTANEOUS | Status: DC
  Administered 2021-03-11: 3 [IU] via SUBCUTANEOUS
  Filled 2021-03-11: qty 1

## 2021-03-11 MED ORDER — ACETAMINOPHEN 500 MG PO TABS
1000.0000 mg | ORAL_TABLET | Freq: Once | ORAL | Status: AC
  Administered 2021-03-11: 1000 mg via ORAL
  Filled 2021-03-11: qty 2

## 2021-03-11 MED ORDER — INSULIN GLARGINE 100 UNIT/ML ~~LOC~~ SOLN
40.0000 [IU] | Freq: Once | SUBCUTANEOUS | Status: AC
  Administered 2021-03-11: 40 [IU] via SUBCUTANEOUS
  Filled 2021-03-11: qty 0.4

## 2021-03-11 MED ORDER — INSULIN ASPART 100 UNIT/ML ~~LOC~~ SOLN
0.0000 [IU] | Freq: Three times a day (TID) | SUBCUTANEOUS | Status: DC
  Administered 2021-03-12 – 2021-03-13 (×3): 3 [IU] via SUBCUTANEOUS
  Administered 2021-03-13: 5 [IU] via SUBCUTANEOUS
  Administered 2021-03-14: 1 [IU] via SUBCUTANEOUS
  Filled 2021-03-11 (×5): qty 1

## 2021-03-11 MED ORDER — VANCOMYCIN HCL IN DEXTROSE 1-5 GM/200ML-% IV SOLN
1000.0000 mg | Freq: Once | INTRAVENOUS | Status: AC
  Administered 2021-03-11: 1000 mg via INTRAVENOUS
  Filled 2021-03-11: qty 200

## 2021-03-11 MED ORDER — IOHEXOL 350 MG/ML SOLN
75.0000 mL | Freq: Once | INTRAVENOUS | Status: AC | PRN
  Administered 2021-03-11: 75 mL via INTRAVENOUS
  Filled 2021-03-11: qty 75

## 2021-03-11 MED ORDER — ALBUTEROL SULFATE HFA 108 (90 BASE) MCG/ACT IN AERS
2.0000 | INHALATION_SPRAY | RESPIRATORY_TRACT | Status: DC | PRN
  Filled 2021-03-11: qty 6.7

## 2021-03-11 MED ORDER — ONDANSETRON HCL 4 MG/2ML IJ SOLN: 4.0000 mg | Freq: Four times a day (QID) | INTRAMUSCULAR | Status: DC | PRN

## 2021-03-11 MED ORDER — GABAPENTIN 100 MG PO CAPS
100.0000 mg | ORAL_CAPSULE | ORAL | Status: AC
  Administered 2021-03-11: 100 mg via ORAL
  Filled 2021-03-11: qty 1

## 2021-03-11 MED ORDER — NICOTINE 21 MG/24HR TD PT24
21.0000 mg | MEDICATED_PATCH | Freq: Every day | TRANSDERMAL | Status: DC
  Filled 2021-03-11 (×3): qty 1

## 2021-03-11 MED ORDER — LACTATED RINGERS IV BOLUS
1000.0000 mL | Freq: Once | INTRAVENOUS | Status: AC
  Administered 2021-03-11: 1000 mL via INTRAVENOUS

## 2021-03-11 MED ORDER — DM-GUAIFENESIN ER 30-600 MG PO TB12
1.0000 | ORAL_TABLET | Freq: Two times a day (BID) | ORAL | Status: DC | PRN
  Administered 2021-03-13: 1 via ORAL
  Filled 2021-03-11 (×2): qty 1

## 2021-03-11 MED ORDER — ACETAMINOPHEN 650 MG RE SUPP: 650.0000 mg | Freq: Four times a day (QID) | RECTAL | Status: DC | PRN

## 2021-03-11 MED ORDER — ACETAMINOPHEN 325 MG PO TABS: 650.0000 mg | ORAL_TABLET | Freq: Four times a day (QID) | ORAL | Status: DC | PRN

## 2021-03-11 MED ORDER — ASPIRIN EC 81 MG PO TBEC
81.0000 mg | DELAYED_RELEASE_TABLET | Freq: Every day | ORAL | Status: DC
  Administered 2021-03-11 – 2021-03-14 (×4): 81 mg via ORAL
  Filled 2021-03-11 (×4): qty 1

## 2021-03-11 MED ORDER — VANCOMYCIN HCL 1250 MG/250ML IV SOLN
1250.0000 mg | Freq: Two times a day (BID) | INTRAVENOUS | Status: DC
  Administered 2021-03-12 – 2021-03-13 (×4): 1250 mg via INTRAVENOUS
  Filled 2021-03-11 (×11): qty 250

## 2021-03-11 MED ORDER — ONDANSETRON HCL 4 MG PO TABS: 4.0000 mg | ORAL_TABLET | Freq: Four times a day (QID) | ORAL | Status: DC | PRN

## 2021-03-11 MED ORDER — HEPARIN SODIUM (PORCINE) 5000 UNIT/ML IJ SOLN
5000.0000 [IU] | Freq: Three times a day (TID) | INTRAMUSCULAR | Status: DC
  Administered 2021-03-11 – 2021-03-14 (×9): 5000 [IU] via SUBCUTANEOUS
  Filled 2021-03-11 (×9): qty 1

## 2021-03-11 MED ORDER — SODIUM CHLORIDE 0.9 % IV SOLN
2.0000 g | Freq: Once | INTRAVENOUS | Status: AC
  Administered 2021-03-11: 2 g via INTRAVENOUS
  Filled 2021-03-11: qty 2

## 2021-03-11 MED ORDER — HYDRALAZINE HCL 20 MG/ML IJ SOLN: 5.0000 mg | INTRAMUSCULAR | Status: DC | PRN

## 2021-03-11 MED ORDER — OXYCODONE-ACETAMINOPHEN 5-325 MG PO TABS
1.0000 | ORAL_TABLET | ORAL | Status: DC | PRN
  Administered 2021-03-12 – 2021-03-13 (×3): 1 via ORAL
  Filled 2021-03-11 (×3): qty 1

## 2021-03-11 MED ORDER — SODIUM CHLORIDE 0.9 % IV SOLN
2.0000 g | Freq: Three times a day (TID) | INTRAVENOUS | Status: DC
  Administered 2021-03-11 – 2021-03-14 (×9): 2 g via INTRAVENOUS
  Filled 2021-03-11 (×12): qty 2

## 2021-03-11 MED ORDER — LACTATED RINGERS IV SOLN: INTRAVENOUS | Status: AC

## 2021-03-11 NOTE — Progress Notes (Signed)
CODE SEPSIS - PHARMACY COMMUNICATION  **Broad Spectrum Antibiotics should be administered within 1 hour of Sepsis diagnosis**  Time Code Sepsis Called/Page Received: 1520  Antibiotics Ordered: cefepime/vancomycin  Time of 1st antibiotic administration: [place] ,PharmD Clinical Pharmacist  03/11/2021  3:22 PM

## 2021-03-11 NOTE — Progress Notes (Signed)
Pharmacy Antibiotic Note  Joshua [name] is a 50 y.o. male admitted on 03/11/2021. Concern for pneumonia as well as diabetic foot infection. Pharmacy has been consulted for vancomycin and cefepime dosing.  Plan: Vancomycin 2000 mg IV x 1 followed by vancomycin 1250 mg IV q12h  Goal AUC 400-550 Expected AUC: 466 SCr used: 0.93  Cefepime 2 g IV q8h   Height: 5\' 10"  (177.8 cm) Weight: 85.8 kg (189 lb 1.6 oz) IBW/kg (Calculated) : 73  Temp (24hrs), Avg:99.9 F (37.7 C), Min:99 F (37.2 C), Max:100.5 F (38.1 C)  Recent Labs  Lab 03/11/21 1131 03/11/21 1332  WBC 13.2*  --   CREATININE 0.93  --   LATICACIDVEN  --  1.3    Estimated Creatinine Clearance: 99.2 mL/min (by C-G formula based on SCr of 0.93 mg/dL).    No Known Allergies  Antimicrobials this admission: Vancomycin 3/29 >> Cefepime 3/29 >>  Microbiology results: 3/29 BCx: pending   Thank you for allowing pharmacy to be a part of this patient's care.  Tawnya Crook, PharmD 03/11/2021 3:54 PM

## 2021-03-11 NOTE — H&P (Signed)
History and Physical    Joshua [name] KNL:[id number] DOB: 1971-02-19 DOA: 03/11/2021  Referring MD/NP/PA:   PCP: Patient, No Pcp Per (Inactive)   Patient coming from:  The patient is homeless.  At baseline, pt is independent for most of ADL.        Chief Complaint: chest pain and right foot pain  HPI: Joshua [name] is a 50 y.o. male with medical history significant of HTN, DM, CHF with EF 25-30%, CAD, cocaine abuse, anemia, tobacco abuse, recent admission for right greater toe osteomyelitis, homeless who presents with chest pain, right foot pain.  Patient was recently hospitalized from 3/2-3/9 due to right great due to osteomyelitis. Per discharge summary, "seen by vascular surgery and podiatry. Underwent angiogram which showed sufficient right lower extremity blood flow. Recommendation of distal phalanx amputation, but patient prefers to wait despite multiple discussions".  Patient was treated with IV vancomycin, ceftriaxone and metronidazole in hospital, discharged on Augmentin.  Patient states that he continues to have her right great toe pain with some pus drainage.  He also reports cough, fever, chills, chest pain.  Chest pain is located in the front chest, mild, constant, sharp, nonradiating.  He has nausea, but no vomiting, diarrhea or abdominal pain.  No symptoms of UTI.  ED Course: pt was found to have WBC 13.2, troponin level 7 --> 7, BNP 173, lactic acid 1.3, negative urinalysis, negative Covid PCR, D-dimer 0.75, UDS positive for cocaine and cannabinoid, temperature 100.5, blood pressure 120/77, heart rate 109, RR 25, oxygen saturation 97% on room air.  Chest x-ray negative.  CT angiogram is negative for PE, showed bilateral lower lobe infiltration.  X-ray of right great toe osteomyelitis.  Patient is admitted to progressive bed as inpatient.  Review of Systems:   General: has fevers, chills, no body weight gain, has fatigue HEENT: no blurry vision, hearing  changes or sore throat Respiratory: has dyspnea, coughing, no wheezing CV: has chest pain, no palpitations GI: has nausea, no vomiting, abdominal pain, diarrhea, constipation GU: no dysuria, burning on urination, increased urinary frequency, hematuria  Ext: no leg edema. Has right great toe pain. Neuro: no unilateral weakness, numbness, or tingling, no vision change or hearing loss Skin: no rash, no skin tear. MSK: No muscle spasm, no deformity, no limitation of range of movement in spin Heme: No easy bruising.  Travel history: No recent long distant travel.  Allergy: No Known Allergies  Past Medical History:  Diagnosis Date  . CHF (congestive heart failure) ([place])   . Diabetes mellitus without complication ([place])   . Hypertension     Past Surgical History:  Procedure Laterality Date  . CORONARY/GRAFT ACUTE MI REVASCULARIZATION N/A 09/09/2018   Procedure: Coronary/Graft Acute MI Revascularization;  Surgeon: Yolonda Kida, MD;  Location: [facility];  Service: Cardiovascular;  Laterality: N/A;  . LEFT HEART CATH AND CORONARY ANGIOGRAPHY N/A 09/09/2018   Procedure: LEFT HEART CATH AND CORONARY ANGIOGRAPHY;  Surgeon: Yolonda Kida, MD;  Location: [facility];  Service: Cardiovascular;  Laterality: N/A;  . LOWER EXTREMITY ANGIOGRAPHY Right 02/14/2021   Procedure: Lower Extremity Angiography;  Surgeon: Katha Cabal, MD;  Location: [facility];  Service: Cardiovascular;  Laterality: Right;  . none    . STENT PLACEMENT VASCULAR ([place] [facility])      Social History:  reports that he has been smoking. He has never used smokeless tobacco. He reports current alcohol use. He reports previous drug use.  Family  History:  Family History  Problem Relation Age of Onset  . Cancer Mother   . CAD Brother      Prior to Admission medications   Medication Sig Start Date End Date Taking? Authorizing Provider  ascorbic acid (VITAMIN C) 250 MG tablet Take 1 tablet (250  mg total) by mouth 2 (two) times daily. 02/19/21   Lorella Nimrod, MD  carvedilol (COREG) 3.125 MG tablet Take 1 tablet (3.125 mg total) by mouth 2 (two) times daily with a meal. 02/19/21   Lorella Nimrod, MD  dapagliflozin propanediol (FARXIGA) 5 MG TABS tablet Take 1 tablet (5 mg total) by mouth daily. 02/19/21   Lorella Nimrod, MD  fluticasone (FLONASE) 50 MCG/ACT nasal spray Place 2 sprays into both nostrils daily. 10/05/20   Lavina Hamman, MD  furosemide (LASIX) 20 MG tablet Take 1 tablet (20 mg total) by mouth daily as needed (for weight gain >3lbs in 1 days and >5lbs in 2 days). 02/19/21 02/19/22  Lorella Nimrod, MD  gabapentin (NEURONTIN) 100 MG capsule Take 1 capsule (100 mg total) by mouth at bedtime. 02/19/21   Lorella Nimrod, MD  insulin glargine (LANTUS) 100 UNIT/ML Solostar Pen Inject 40 Units into the skin daily. 02/19/21   Lorella Nimrod, MD  Insulin Pen Needle (PEN NEEDLES) 31G X 5 MM MISC 40 Units by Does not apply route at bedtime. 02/19/21   Lorella Nimrod, MD  losartan (COZAAR) 25 MG tablet Take 1 tablet (25 mg total) by mouth daily. 02/19/21 02/19/22  Lorella Nimrod, MD  metFORMIN (GLUCOPHAGE) 500 MG tablet Take 1 tablet (500 mg total) by mouth daily with breakfast. 02/19/21 04/20/21  Lorella Nimrod, MD  Multiple Vitamin (MULTIVITAMIN WITH MINERALS) TABS tablet Take 1 tablet by mouth daily. 02/19/21   Lorella Nimrod, MD  nitroGLYCERIN (NITROSTAT) 0.4 MG SL tablet Place 1 tablet (0.4 mg total) under the tongue every 5 (five) minutes x 3 doses as needed for chest pain. Patient not taking: Reported on 07/16/2020 09/10/18   Demetrios Loll, MD  spironolactone (ALDACTONE) 25 MG tablet Take 1 tablet (25 mg total) by mouth daily. 02/19/21   Lorella Nimrod, MD    Physical Exam: Vitals:   03/11/21 1515 03/11/21 1543 03/11/21 1600 03/11/21 1700  BP: 106/77  (!) 116/92 120/79  Pulse: 85  94 81  Resp: _0 Temp:  99 F (37.2 C)    TempSrc:  Oral    SpO2: 96%  98% 96%  Weight:      Height:       General: Not in acute  distress HEENT:       Eyes: PERRL, EOMI, no scleral icterus.       ENT: No discharge from the ears and nose, no pharynx injection, no tonsillar enlargement.        Neck: No JVD, no bruit, no mass felt. Heme: No neck lymph node enlargement. Cardiac: S1/S2, RRR, No murmurs, No gallops or rubs. Respiratory: No rales, wheezing, rhonchi or rubs. GI: Soft, nondistended, nontender, no rebound pain, no organomegaly, BS present. GU: No hematuria Ext: No pitting leg edema bilaterally. 1+DP/PT pulse bilaterally. Has tenderness, swelling, skin abrasion with little bloody drainage in the right great toe.        Musculoskeletal: No joint deformities, No joint redness or warmth, no limitation of ROM in spin. Skin: No rashes.  Neuro: Alert, oriented X3, cranial nerves II-XII grossly intact, moves all extremities normally. Psych: Patient is not psychotic, no suicidal or hemocidal ideation.  Labs  on Admission: I have personally reviewed following labs and imaging studies  CBC: Recent Labs  Lab 03/11/21 1131  WBC 13.2*  HGB 11.0*  HCT 35.0*  MCV 84.1  PLT 878   Basic Metabolic Panel: Recent Labs  Lab 03/11/21 1131  NA 130*  K 4.3  CL 99  CO2 22  GLUCOSE 363*  BUN 12  CREATININE 0.93  CALCIUM 8.2*   GFR: Estimated Creatinine Clearance: 99.2 mL/min (by C-G formula based on SCr of 0.93 mg/dL). Liver Function Tests: Recent Labs  Lab 03/11/21 1131  AST 17  ALT 12  ALKPHOS 76  BILITOT 0.8  PROT 6.9  ALBUMIN 3.1*   No results for input(s): LIPASE, AMYLASE in the last 168 hours. No results for input(s): AMMONIA in the last 168 hours. Coagulation Profile: Recent Labs  Lab 03/11/21 1332  INR 1.2   Cardiac Enzymes: No results for input(s): CKTOTAL, CKMB, CKMBINDEX, TROPONINI in the last 168 hours. BNP (last 3 results) No results for input(s): PROBNP in the last 8760 hours. HbA1C: Recent Labs    03/11/21 1131  HGBA1C 11.2*   CBG: Recent Labs  Lab 03/11/21 1515   GLUCAP 181*   Lipid Profile: No results for input(s): CHOL, HDL, LDLCALC, TRIG, CHOLHDL, LDLDIRECT in the last 72 hours. Thyroid Function Tests: No results for input(s): TSH, T4TOTAL, FREET4, T3FREE, THYROIDAB in the last 72 hours. Anemia Panel: No results for input(s): VITAMINB12, FOLATE, FERRITIN, TIBC, IRON, RETICCTPCT in the last 72 hours. Urine analysis:    Component Value Date/Time   COLORURINE YELLOW (A) 03/11/2021 1332   APPEARANCEUR CLEAR (A) 03/11/2021 1332   LABSPEC 1.028 03/11/2021 1332   PHURINE 6.0 03/11/2021 1332   GLUCOSEU >=500 (A) 03/11/2021 1332   HGBUR NEGATIVE 03/11/2021 1332   BILIRUBINUR NEGATIVE 03/11/2021 1332   KETONESUR NEGATIVE 03/11/2021 1332   PROTEINUR NEGATIVE 03/11/2021 1332   NITRITE NEGATIVE 03/11/2021 1332   LEUKOCYTESUR NEGATIVE 03/11/2021 1332   Sepsis Labs: _0 (procalcitonin:4,lacticidven:4) ) Recent Results (from the past 240 hour(s))  Resp Panel by RT-PCR (Flu A&B, Covid) Nasopharyngeal Swab     Status: None   Collection Time: 03/11/21  1:40 PM   Specimen: Nasopharyngeal Swab; Nasopharyngeal(NP) swabs in vial transport medium  Result Value Ref Range Status   SARS Coronavirus 2 by RT PCR NEGATIVE NEGATIVE Final    Comment: (NOTE) SARS-CoV-2 target nucleic acids are NOT DETECTED.  The SARS-CoV-2 RNA is generally detectable in upper respiratory specimens during the acute phase of infection. The lowest concentration of SARS-CoV-2 viral copies this assay can detect is 138 copies/mL. A negative result does not preclude SARS-Cov-2 infection and should not be used as the sole basis for treatment or other patient management decisions. A negative result may occur with  improper specimen collection/handling, submission of specimen other than nasopharyngeal swab, presence of viral mutation(s) within the areas targeted by this assay, and inadequate number of viral copies(<138 copies/mL). A negative result must be combined with clinical  observations, patient history, and epidemiological information. The expected result is Negative.  Fact Sheet for Patients:  EntrepreneurPulse.com.au  Fact Sheet for Healthcare Providers:  IncredibleEmployment.be  This test is no t yet approved or cleared by the [place] FDA and  has been authorized for detection and/or diagnosis of SARS-CoV-2 by FDA under an Emergency Use Authorization (EUA). This EUA will remain  in effect (meaning this test can be used) for the duration of the COVID-19 declaration under Section 564(b)(1) of the Act, 21 U.S.C.section 360bbb-3(b)(1), unless the authorization  is terminated  or revoked sooner.       Influenza A by PCR NEGATIVE NEGATIVE Final   Influenza B by PCR NEGATIVE NEGATIVE Final    Comment: (NOTE) The Xpert Xpress SARS-CoV-2/FLU/RSV plus assay is intended as an aid in the diagnosis of influenza from Nasopharyngeal swab specimens and should not be used as a sole basis for treatment. Nasal washings and aspirates are unacceptable for Xpert Xpress SARS-CoV-2/FLU/RSV testing.  Fact Sheet for Patients: EntrepreneurPulse.com.au  Fact Sheet for Healthcare Providers: IncredibleEmployment.be  This test is not yet approved or cleared by the [place] FDA and has been authorized for detection and/or diagnosis of SARS-CoV-2 by FDA under an Emergency Use Authorization (EUA). This EUA will remain in effect (meaning this test can be used) for the duration of the COVID-19 declaration under Section 564(b)(1) of the Act, 21 U.S.C. section 360bbb-3(b)(1), unless the authorization is terminated or revoked.  Performed at [facility], [address] [place], [place] [address]      Radiological Exams on Admission: DG Chest 2 View  Result Date: 03/11/2021 CLINICAL DATA:  Chest pain EXAM: CHEST - 2 VIEW COMPARISON:  10/03/2020 FINDINGS: The heart size and  mediastinal contours are within normal limits. Both lungs are clear. The visualized skeletal structures are unremarkable. IMPRESSION: No acute abnormality of the lungs. Electronically Signed   By: Eddie Candle M.D.   On: 03/11/2021 12:27   CT Angio Chest PE W and/or Wo Contrast  Result Date: 03/11/2021 CLINICAL DATA:  Chest pain EXAM: CT ANGIOGRAPHY CHEST WITH CONTRAST TECHNIQUE: Multidetector CT imaging of the chest was performed using the standard protocol during bolus administration of intravenous contrast. Multiplanar CT image reconstructions and MIPs were obtained to evaluate the vascular anatomy. CONTRAST:  59m OMNIPAQUE IOHEXOL 350 MG/ML SOLN COMPARISON:  Chest radiograph March 11, 2021 FINDINGS: Cardiovascular: There is no demonstrable pulmonary embolus. There is no thoracic aortic aneurysm or dissection. Visualized great vessels appear normal. Right innominate and left common carotid arteries arise as a common trunk, there is calcification in multiple coronary arteries. There is no pericardial effusion or pericardial thickening. Mediastinum/Nodes: Thyroid appears normal. No evident thoracic adenopathy. Small hiatal hernia noted. Lungs/Pleura: There is ill-defined airspace opacity in portions of each lower lobe, with airspace opacity in portions of all segments of the right lower lobe and in portions of the superior segment, posterior segment, and lateral segment left lower lobe. Upper lobes and right middle lobe are clear. No pleural effusions are evident. No pneumothorax. Trachea and major bronchial structures are patent. Upper Abdomen: Stomach mildly distended with fluid and air. Visualized upper abdominal structures otherwise appear unremarkable. Musculoskeletal: No blastic or lytic bone lesions. No evident chest wall lesions. Review of the MIP images confirms the above findings. IMPRESSION: 1. no demonstrable pulmonary embolus. No thoracic aortic aneurysm or dissection. There are multiple foci of  coronary artery calcification. 2. Areas of pneumonia in each lower lobe, slightly more severe on the right than the left. No pleural effusions. 3.  Small hiatal hernia. 4.  No evident adenopathy. Electronically Signed   By: WLowella GripIII M.D.   On: 03/11/2021 15:15   DG Foot Complete Right  Result Date: 03/11/2021 CLINICAL DATA:  Foot pain EXAM: RIGHT FOOT COMPLETE - 3+ VIEW COMPARISON:  None. FINDINGS: No acute fracture or dislocation. Mild irregularity along the distal medial tuft of the first distal phalanx. Soft tissues are unremarkable. IMPRESSION: No acute osseous injury of the right foot. Mild irregularity along the distal  medial tuft of the first distal phalanx concerning for osteomyelitis. Electronically Signed   By: Kathreen Devoid   On: 03/11/2021 13:54     EKG: I have personally reviewed.  Sinus rhythm, QTC 443, LAE, low voltage, anteroseptal infarction pattern  Assessment/Plan Principal Problem:   HCAP (healthcare-associated pneumonia) Active Problems:   Type 2 diabetes mellitus without complication (HCC)   Chronic combined systolic (congestive) and diastolic (congestive) heart failure (HCC)   Cocaine abuse ([place])   Homeless   Osteomyelitis of great toe of right foot ([facility])   CAD (coronary artery disease)   Sepsis (H[facility])   Chest pain   HTN (hypertension)   Type 2 diabetes mellitus with complications ([facility])   Tobacco abuse   HCAP (healthcare-associated pneumonia): CT angiogram is negative for PE, but showed bilateral lower lobe infiltration.  Patient has cough, fever, chills, chest pain, clinically consistent with pneumonia.   -Admitted to progressive unit as inpatient - IV Vancomycin and cefepime - Mucinex for cough  - Bronchodilators - Urine legionella and S. pneumococcal antigen - Follow up blood culture x2, sputum culture   Osteomyelitis of great toe of right foot: -on vancomycin and cefepime -As needed Tylenol and percocet for pain -Follow-up blood culture and  urine culture -check ESR and CRP -Message sent to Dr. Vickki Muff of podiatry for consultation  Sepsis due to HCAP and osteomyelitis of right great toe: Patient meets criteria for sepsis with leukocytosis with WBC 13.2, tachycardia with heart rate of 109, tachypnea with RR 25.  Patient also has fever 100.5.  Lactic acid is 1.3.  Currently hemodynamically stable - will get Procalcitonin and trend lactic acid level per sepsis protocol - IVF: 1L of LR bolus in ED, followed by 75 mL per hour of NS (patient has congestive heart failure, limiting aggressive IV fluids treatment)  Type 2 diabetes mellitus with complication [facility]): Recent A1c 11.9, poorly controlled.  Patient is taking Metformin, Farxiga, Lantus -Decrease Lantus dose from 40 to 30 units daily -Sliding scale insulin  Chronic combined systolic (congestive) and diastolic (congestive) heart failure: 2D echo on 07/16/2020 showed EF 25--30% with grade 3 diastolic dysfunction.  Patient does not have leg edema or JVD.  CHF seem to be compensated. -Hold Lasix and spirinolactone due to sepsis -Check BNP--> 173  Cocaine abuse ([place]) -Check UDS  Homeless -Consult transition care team  CAD (coronary artery disease) and chest pain: Her chest pain is likely due to pneumonia.  Troponin level 7 -->7 -as needed nitroglycerin, Coreg -Aspirin 81 mg daily  HTN (hypertension) -IV hydralazine as needed -hold cozarr due to sepsis and risk of hypotenison -Coreg  Tobacco abuse -Nicotine patch         DVT ppx: SQ Heparin   Code Status: Full code Family Communication: not done, no family member is at bed side.    Disposition Plan:  Anticipate discharge back to previous environment Consults called: Message sent to Dr. Vickki Muff of podiatry Admission status and Level of care: Progressive Cardiac:   as inpt     Status is: Inpatient  Remains inpatient appropriate because:Inpatient level of care appropriate due to severity of illness   Dispo: The  patient is from: homeless              Anticipated d/c is to: to be determined              Patient currently is not medically stable to d/c.   Difficult to place patient: need transition care team team help  Date of Service 03/11/2021    Ivor Costa [facility]   If 7PM-7AM, please contact night-coverage [email or web address] 03/11/2021, 6:41 PM

## 2021-03-11 NOTE — ED Notes (Signed)
US at bedside

## 2021-03-11 NOTE — ED Triage Notes (Signed)
Pt to ED via POV with c/o chest pain, generalized body aches, states feels like he can't get warm, feels like his feet are bricks and has a difficult time walking. Pt unable to sit still in triage at this time. Pt states symptoms x several days.

## 2021-03-11 NOTE — ED Provider Notes (Signed)
[facility] Emergency Department Provider Note  ____________________________________________   Event Date/Time   First MD Initiated Contact with Patient 03/11/21 1242     (approximate)  I have reviewed the triage vital signs and the nursing notes.   HISTORY  Chief Complaint Chest Pain, Generalized Body Aches, and Chills (/)   HPI Joshua [name] is a 50 y.o. male with a past medical history of DM, HTN, CAD status post PCI with stents, and recent admission 3/2-3/9 for osteomyelitis of the great right toe discharged on Augmentin after receiving several days of vancomycin and ceftriaxone and initial recommendation for distal phalanx amputation who presents for assessment of 3 to 4 days of chest pain, cough, shortness of breath,, decreased appetite, general myalgias although seemingly worse in the bilateral lower extremities specifically in the right toe.  He denies any diarrhea, urinary symptoms, abdominal pain or focal extremity pain or back pain.  He does not recall any recent injuries or falls.  States he has not been able to take any of his medications and has been intermittently taking opioids, marijuana, and drinking heavily intermittently over the last couple weeks.  He is not sure when he last drank or used any of these drugs.  States he has for sure not take any of his antibiotics and does not think he has been taking his insulin.         Past Medical History:  Diagnosis Date  . CHF (congestive heart failure) ([place])   . Diabetes mellitus without complication ([place])   . Hypertension     Patient Active Problem List   Diagnosis Date Noted  . HCAP (healthcare-associated pneumonia) 03/11/2021  . CAD (coronary artery disease) 03/11/2021  . Sepsis ([place]) 03/11/2021  . Chest pain 03/11/2021  . HTN (hypertension) 03/11/2021  . Type 2 diabetes mellitus with complications ([place]) 60/73/7106  . Tobacco abuse 03/11/2021  . Polysubstance abuse ([employer])   .  Osteomyelitis of great toe of right foot ([place]) 02/18/2021  . Malnutrition of moderate degree 02/14/2021  . Cocaine abuse ([place]) 02/13/2021  . Homeless 02/13/2021  . Cellulitis of great toe of right foot 02/12/2021  . Diabetic foot ulcer ([place]) 02/12/2021  . Lactic acidosis 02/12/2021  . Acute metabolic encephalopathy 26/94/8546  . Chronic combined systolic (congestive) and diastolic (congestive) heart failure ([place]) 02/12/2021  . SIRS (systemic inflammatory response syndrome) ([place]) 10/04/2020  . Acute respiratory failure with hypoxia ([place]) 07/15/2020  . Multifocal pneumonia 07/15/2020  . Type 2 diabetes mellitus without complication ([place]) 27/02/5008  . History of MI (myocardial infarction) 07/15/2020  . Symptomatic anemia 07/15/2020  . Acute ST elevation myocardial infarction (STEMI) involving left anterior descending (LAD) coronary artery ([place]) 09/09/2018  . STEMI involving left anterior descending coronary artery ([place]) 09/09/2018  . Hypoglycemia 06/02/2016    Past Surgical History:  Procedure Laterality Date  . CORONARY/GRAFT ACUTE MI REVASCULARIZATION N/A 09/09/2018   Procedure: Coronary/Graft Acute MI Revascularization;  Surgeon: Yolonda Kida, MD;  Location: [place] [facility];  Service: Cardiovascular;  Laterality: N/A;  . LEFT HEART CATH AND CORONARY ANGIOGRAPHY N/A 09/09/2018   Procedure: LEFT HEART CATH AND CORONARY ANGIOGRAPHY;  Surgeon: Yolonda Kida, MD;  Location: [place] [facility];  Service: Cardiovascular;  Laterality: N/A;  . LOWER EXTREMITY ANGIOGRAPHY Right 02/14/2021   Procedure: Lower Extremity Angiography;  Surgeon: Katha Cabal, MD;  Location: [place] [facility];  Service: Cardiovascular;  Laterality: Right;  . none    . STENT PLACEMENT VASCULAR [facility]  HX)      Prior to Admission medications   Medication Sig Start Date End Date Taking? Authorizing Provider  ascorbic acid (VITAMIN C) 250 MG tablet Take 1 tablet (250 mg total) by mouth 2 (two)  times daily. 02/19/21   Lorella Nimrod, MD  carvedilol (COREG) 3.125 MG tablet Take 1 tablet (3.125 mg total) by mouth 2 (two) times daily with a meal. 02/19/21   Lorella Nimrod, MD  dapagliflozin propanediol (FARXIGA) 5 MG TABS tablet Take 1 tablet (5 mg total) by mouth daily. 02/19/21   Lorella Nimrod, MD  fluticasone (FLONASE) 50 MCG/ACT nasal spray Place 2 sprays into both nostrils daily. 10/05/20   Lavina Hamman, MD  furosemide (LASIX) 20 MG tablet Take 1 tablet (20 mg total) by mouth daily as needed (for weight gain >3lbs in 1 days and >5lbs in 2 days). 02/19/21 02/19/22  Lorella Nimrod, MD  gabapentin (NEURONTIN) 100 MG capsule Take 1 capsule (100 mg total) by mouth at bedtime. 02/19/21   Lorella Nimrod, MD  insulin glargine (LANTUS) 100 UNIT/ML Solostar Pen Inject 40 Units into the skin daily. 02/19/21   Lorella Nimrod, MD  Insulin Pen Needle (PEN NEEDLES) 31G X 5 MM MISC 40 Units by Does not apply route at bedtime. 02/19/21   Lorella Nimrod, MD  losartan (COZAAR) 25 MG tablet Take 1 tablet (25 mg total) by mouth daily. 02/19/21 02/19/22  Lorella Nimrod, MD  metFORMIN (GLUCOPHAGE) 500 MG tablet Take 1 tablet (500 mg total) by mouth daily with breakfast. 02/19/21 04/20/21  Lorella Nimrod, MD  Multiple Vitamin (MULTIVITAMIN WITH MINERALS) TABS tablet Take 1 tablet by mouth daily. 02/19/21   Lorella Nimrod, MD  nitroGLYCERIN (NITROSTAT) 0.4 MG SL tablet Place 1 tablet (0.4 mg total) under the tongue every 5 (five) minutes x 3 doses as needed for chest pain. Patient not taking: Reported on 07/16/2020 09/10/18   Demetrios Loll, MD  spironolactone (ALDACTONE) 25 MG tablet Take 1 tablet (25 mg total) by mouth daily. 02/19/21   Lorella Nimrod, MD    Allergies Patient has no known allergies.  History reviewed. No pertinent family history.  Social History Social History   Tobacco Use  . Smoking status: Current Every Day Smoker  . Smokeless tobacco: Never Used  Vaping Use  . Vaping Use: Never used  Substance Use Topics  . Alcohol  use: Yes    Alcohol/week: 0.0 standard drinks  . Drug use: Not Currently    Comment: pt states no found unknown white substance in mouth    Review of Systems  Review of Systems  Constitutional: Positive for chills, malaise/fatigue and weight loss. Negative for fever.  HENT: Negative for sore throat.   Eyes: Negative for pain.  Respiratory: Positive for cough and shortness of breath. Negative for stridor.   Cardiovascular: Positive for chest pain.  Gastrointestinal: Positive for nausea. Negative for abdominal pain, diarrhea and vomiting.  Genitourinary: Negative for dysuria.  Musculoskeletal: Positive for myalgias ( b/l lower legs and feet, b/l arms and neck).  Skin: Negative for rash.  Neurological: Positive for tingling ( b/l legs). Negative for sensory change, seizures, loss of consciousness and headaches.  Psychiatric/Behavioral: Negative for suicidal ideas.  All other systems reviewed and are negative.     ____________________________________________   PHYSICAL EXAM:  VITAL SIGNS: ED Triage Vitals  Enc Vitals Group     BP 03/11/21 1128 121/76     Pulse Rate 03/11/21 1128 (!) 109     Resp 03/11/21 1128 20  Temp 03/11/21 1128 100.1 F (37.8 C)     Temp Source 03/11/21 1128 Oral     SpO2 03/11/21 1128 98 %     Weight 03/11/21 1129 189 lb 1.6 oz (85.8 kg)     Height 03/11/21 1129 5\' 10"  (1.778 m)     Head Circumference --      Peak Flow --      Pain Score 03/11/21 1128 7     Pain Loc --      Pain Edu? --      Excl. in [place]? --    Vitals:   03/11/21 1543 03/11/21 1600  BP:  (!) 116/92  Pulse:  94  Resp:  20  Temp: 99 F (37.2 C)   SpO2:  98%   Physical Exam Vitals and nursing note reviewed.  Constitutional:      Appearance: Normal appearance. He is well-developed and normal weight.  HENT:     Head: Normocephalic and atraumatic.     Right Ear: External ear normal.     Left Ear: External ear normal.     Mouth/Throat:     Mouth: Mucous membranes are dry.   Eyes:     Conjunctiva/sclera: Conjunctivae normal.  Cardiovascular:     Rate and Rhythm: Regular rhythm. Tachycardia present.     Heart sounds: No murmur heard.   Pulmonary:     Effort: Pulmonary effort is normal. No respiratory distress.     Breath sounds: Normal breath sounds.  Abdominal:     Palpations: Abdomen is soft.     Tenderness: There is no abdominal tenderness.  Musculoskeletal:     Cervical back: Neck supple.  Skin:    General: Skin is warm and dry.  Neurological:     Mental Status: He is alert and oriented to person, place, and time.  Psychiatric:        Mood and Affect: Mood normal.       He does have some skin fluctuance over the first toe without surrounding skin changes although there is significant sloughing of dry skin around the foot. ____________________________________________   LABS (all labs ordered are listed, but only abnormal results are displayed)  Labs Reviewed  CBC - Abnormal; Notable for the following components:      Result Value   WBC 13.2 (*)    RBC 4.16 (*)    Hemoglobin 11.0 (*)    HCT 35.0 (*)    All other components within normal limits  COMPREHENSIVE METABOLIC PANEL - Abnormal; Notable for the following components:   Sodium 130 (*)    Glucose, Bld 363 (*)    Calcium 8.2 (*)    Albumin 3.1 (*)    All other components within normal limits  URINALYSIS, COMPLETE (UACMP) WITH MICROSCOPIC - Abnormal; Notable for the following components:   Color, Urine YELLOW (*)    APPearance CLEAR (*)    Glucose, UA >=500 (*)    All other components within normal limits  D-DIMER, QUANTITATIVE (NOT AT [facility]) - Abnormal; Notable for the following components:   D-Dimer, Quant 0.75 (*)    All other components within normal limits  SEDIMENTATION RATE - Abnormal; Notable for the following components:   Sed Rate 69 (*)    All other components within normal limits  URINE DRUG SCREEN, QUALITATIVE ([facility] ONLY) - Abnormal; Notable for the following  components:   Cocaine Metabolite,Ur [place] POSITIVE (*)    Cannabinoid 50 Ng, Ur [place] POSITIVE (*)    All other  components within normal limits  BRAIN NATRIURETIC PEPTIDE - Abnormal; Notable for the following components:   B Natriuretic Peptide 173.8 (*)    All other components within normal limits  CBG MONITORING, ED - Abnormal; Notable for the following components:   Glucose-Capillary 181 (*)    All other components within normal limits  RESP PANEL BY RT-PCR (FLU A&B, COVID) ARPGX2  CULTURE, BLOOD (ROUTINE X 2)  CULTURE, BLOOD (ROUTINE X 2)  EXPECTORATED SPUTUM ASSESSMENT W GRAM STAIN, RFLX TO RESP C  PROCALCITONIN  LACTIC ACID, PLASMA  HEMOGLOBIN A1C  HIV ANTIBODY (ROUTINE TESTING W REFLEX)  C-REACTIVE PROTEIN  PROTIME-INR  APTT  LEGIONELLA PNEUMOPHILA SEROGP 1 UR AG  STREP PNEUMONIAE URINARY ANTIGEN  TROPONIN I (HIGH SENSITIVITY)  TROPONIN I (HIGH SENSITIVITY)   ____________________________________________  EKG  Sinus tachycardia with ventricular rate of 106, normal axis, unremarkable intervals, nonspecific ST changes in anterior leads without other clear evidence of acute ischemia or other significant delay arrhythmia. ____________________________________________  RADIOLOGY  ED MD interpretation: No focal consolidation, effusion, overt edema, pneumothorax or any other clear acute intrathoracic process.  No clear fracture dislocation but there is some evidence of osteomyelitis around the distal tuft of the first distal phalanx.  CTA chest shows no evidence of PE but does show evidence of pneumonia without other clear acute thoracic pathology.  Official radiology report(s): DG Chest 2 View  Result Date: 03/11/2021 CLINICAL DATA:  Chest pain EXAM: CHEST - 2 VIEW COMPARISON:  10/03/2020 FINDINGS: The heart size and mediastinal contours are within normal limits. Both lungs are clear. The visualized skeletal structures are unremarkable. IMPRESSION: No acute abnormality of the lungs.  Electronically Signed   By: Eddie Candle M.D.   On: 03/11/2021 12:27   CT Angio Chest PE W and/or Wo Contrast  Result Date: 03/11/2021 CLINICAL DATA:  Chest pain EXAM: CT ANGIOGRAPHY CHEST WITH CONTRAST TECHNIQUE: Multidetector CT imaging of the chest was performed using the standard protocol during bolus administration of intravenous contrast. Multiplanar CT image reconstructions and MIPs were obtained to evaluate the vascular anatomy. CONTRAST:  47mL OMNIPAQUE IOHEXOL 350 MG/ML SOLN COMPARISON:  Chest radiograph March 11, 2021 FINDINGS: Cardiovascular: There is no demonstrable pulmonary embolus. There is no thoracic aortic aneurysm or dissection. Visualized great vessels appear normal. Right innominate and left common carotid arteries arise as a common trunk, there is calcification in multiple coronary arteries. There is no pericardial effusion or pericardial thickening. Mediastinum/Nodes: Thyroid appears normal. No evident thoracic adenopathy. Small hiatal hernia noted. Lungs/Pleura: There is ill-defined airspace opacity in portions of each lower lobe, with airspace opacity in portions of all segments of the right lower lobe and in portions of the superior segment, posterior segment, and lateral segment left lower lobe. Upper lobes and right middle lobe are clear. No pleural effusions are evident. No pneumothorax. Trachea and major bronchial structures are patent. Upper Abdomen: Stomach mildly distended with fluid and air. Visualized upper abdominal structures otherwise appear unremarkable. Musculoskeletal: No blastic or lytic bone lesions. No evident chest wall lesions. Review of the MIP images confirms the above findings. IMPRESSION: 1. no demonstrable pulmonary embolus. No thoracic aortic aneurysm or dissection. There are multiple foci of coronary artery calcification. 2. Areas of pneumonia in each lower lobe, slightly more severe on the right than the left. No pleural effusions. 3.  Small hiatal hernia.  4.  No evident adenopathy. Electronically Signed   By: Lowella Grip III M.D.   On: 03/11/2021 15:15   DG Foot Complete  Right  Result Date: 03/11/2021 CLINICAL DATA:  Foot pain EXAM: RIGHT FOOT COMPLETE - 3+ VIEW COMPARISON:  None. FINDINGS: No acute fracture or dislocation. Mild irregularity along the distal medial tuft of the first distal phalanx. Soft tissues are unremarkable. IMPRESSION: No acute osseous injury of the right foot. Mild irregularity along the distal medial tuft of the first distal phalanx concerning for osteomyelitis. Electronically Signed   By: Kathreen Devoid   On: 03/11/2021 13:54    ____________________________________________   PROCEDURES  Procedure(s) performed (including Critical Care):  .1-3 Lead EKG Interpretation Performed by: Lucrezia Starch, MD Authorized by: Lucrezia Starch, MD     Interpretation: normal     ECG rate assessment: tachycardic     Rhythm: sinus tachycardia     Ectopy: none     Conduction: normal    .Critical Care Performed by: Lucrezia Starch, MD Authorized by: Lucrezia Starch, MD   Critical care provider statement:    Critical care time (minutes):  45   Critical care was necessary to treat or prevent imminent or life-threatening deterioration of the following conditions:  Sepsis   Critical care was time spent personally by me on the following activities:  Discussions with consultants, evaluation of patient's response to treatment, examination of patient, ordering and performing treatments and interventions, ordering and review of laboratory studies, ordering and review of radiographic studies, pulse oximetry, re-evaluation of patient's condition, obtaining history from patient or surrogate and review of old charts     ____________________________________________   INITIAL IMPRESSION / [place] / ED COURSE      Patient presents with above-stated history and exam for assessment of couple days of chest pain, cough,  shortness of breath, myalgias and worsening acute on chronic pain in his bilateral feet worse in the right toe.  This is in the setting of recently being hospitalized for osteomyelitis of the patient has not been taking his outpatient antibiotics as recommended.  Also has not been taking any of his medications but has been using p.o. illicit opioids and marijuana.  On arrival he is tachycardic with a heart rate of 109, borderline febrile with a temperature of 100.1 which on recheck was 100.5 treat afebrile and otherwise with stable vital signs on room air.  On exam his lungs are clear bilaterally his oropharynx is unremarkable and his abdomen is soft and nontender throughout.  He does have decreased pulses in his feet and some fluctuance significant tenderness around his first great right toe.  With regard to patient's chest pain, shortness of breath differential includes viral bronchitis, pneumonia, myocarditis, ACS, PE, pleurisy and chest wall etiologies.  Chest x-ray has no evidence of pneumonia or other clear acute thoracic process.  While ECG has some nonspecific findings given nonelevated troponin obtained greater than 3 hours after symptom onset Evalose patient for ACS or myocarditis.  D-dimer sent to assess risk for PE.  CTA chest shows no evidence of PE but does show evidence of pneumonia not seen on chest x-ray.  Given fever, elevated heart rate and tachycardia concern for possible sepsis.  Is possible that this is a source although as noted below patient also has concern for possible ongoing osteomyelitis.  With regard to patient's right toe pain and some fluctuance on exam concern for possible persistent osteomyelitis possible complicated by bacteremia and sepsis.  X-ray does show findings consistent with osteomyelitis   CBC remarkable for leukocytosis with WBC count of 13.2, hemoglobin of 11 and normal platelets.  CMP remarkable for glucose of 363 and pseudohyponatremia corrects to normal at  130 with no other significant electrolyte or metabolic derangements.  Pro-Cal is elevated at 0.21.  Given concern for sepsis with 2 possible sources patient started on Vanco and cefepime after blood cultures were obtained.  He was given a liter of IV fluids but given he has significant reduced ejection fraction and is not hypotensive will be very judicious with further IV fluid resuscitation and defer further large volume boluses and place patient on 150 cc/h.  Patient will be admitted to medicine service for further evaluation management.       ____________________________________________   FINAL CLINICAL IMPRESSION(S) / ED DIAGNOSES  Final diagnoses:  Sepsis, due to unspecified organism, unspecified whether acute organ dysfunction present ([place])  Hyperglycemia  Polysubstance abuse ([place])  Subacute osteomyelitis of right foot ([place])  Healthcare-associated pneumonia    Medications  lactated ringers infusion (has no administration in time range)  heparin injection 5,000 Units (has no administration in time range)  acetaminophen (TYLENOL) tablet 650 mg (has no administration in time range)    Or  acetaminophen (TYLENOL) suppository 650 mg (has no administration in time range)  ondansetron (ZOFRAN) tablet 4 mg (has no administration in time range)    Or  ondansetron (ZOFRAN) injection 4 mg (has no administration in time range)  oxyCODONE-acetaminophen (PERCOCET/ROXICET) 5-325 MG per tablet 1 tablet (has no administration in time range)  nicotine (NICODERM CQ - dosed in mg/24 hours) patch 21 mg (21 mg Transdermal Patient Refused/Not Given 03/11/21 1610)  insulin aspart (novoLOG) injection 0-9 Units (0 Units Subcutaneous Not Given 03/11/21 1604)  insulin aspart (novoLOG) injection 0-5 Units (has no administration in time range)  albuterol (VENTOLIN HFA) 108 (90 Base) MCG/ACT inhaler 2 puff (has no administration in time range)  dextromethorphan-guaiFENesin (MUCINEX DM) 30-600 MG per 12 hr  tablet 1 tablet (has no administration in time range)  vancomycin (VANCOREADY) IVPB 1250 mg/250 mL (has no administration in time range)  ceFEPIme (MAXIPIME) 2 g in sodium chloride 0.9 % 100 mL IVPB (has no administration in time range)  lactated ringers bolus 1,000 mL (0 mLs Intravenous Stopped 03/11/21 1610)  acetaminophen (TYLENOL) tablet 1,000 mg (1,000 mg Oral Given 03/11/21 1317)  gabapentin (NEURONTIN) capsule 100 mg (100 mg Oral Given 03/11/21 1347)  insulin glargine (LANTUS) injection 40 Units (40 Units Subcutaneous Given 03/11/21 1515)  vancomycin (VANCOCIN) IVPB 1000 mg/200 mL premix (0 mg Intravenous Stopped 03/11/21 1513)    Followed by  vancomycin (VANCOCIN) IVPB 1000 mg/200 mL premix (1,000 mg Intravenous New Bag/Given 03/11/21 1513)  ceFEPIme (MAXIPIME) 2 g in sodium chloride 0.9 % 100 mL IVPB (0 g Intravenous Stopped 03/11/21 1442)  iohexol (OMNIPAQUE) 350 MG/ML injection 75 mL (75 mLs Intravenous Contrast Given 03/11/21 1439)     ED Discharge Orders    None       Note:  This document was prepared using Dragon voice recognition software and may include unintentional dictation errors.   Lucrezia Starch, MD 03/11/21 [phone]

## 2021-03-11 NOTE — Progress Notes (Signed)
PHARMACY -  BRIEF ANTIBIOTIC NOTE   Pharmacy has received consult(s) for vancomycin and cefepime from an ED provider.  The patient's profile has been reviewed for ht/wt/allergies/indication/available labs.    One time order(s) placed for vancomycin 2000 mg + cefepime 2 g  Further antibiotics/pharmacy consults should be ordered by admitting physician if indicated.                       Thank you,  Tawnya Crook, PharmD 03/11/2021  1:43 PM

## 2021-03-12 DIAGNOSIS — Z5900 Homelessness unspecified: Secondary | ICD-10-CM

## 2021-03-12 LAB — GLUCOSE, CAPILLARY
Glucose-Capillary: 205 mg/dL — ABNORMAL HIGH (ref 70–99)
Glucose-Capillary: 203 mg/dL — ABNORMAL HIGH (ref 70–99)
Glucose-Capillary: 253 mg/dL — ABNORMAL HIGH (ref 70–99)
Glucose-Capillary: 285 mg/dL — ABNORMAL HIGH (ref 70–99)

## 2021-03-12 LAB — CBC
HCT: 33.2 % — ABNORMAL LOW (ref 39.0–52.0)
Hemoglobin: 10.3 g/dL — ABNORMAL LOW (ref 13.0–17.0)
MCH: 26.5 pg (ref 26.0–34.0)
MCHC: 31 g/dL (ref 30.0–36.0)
MCV: 85.3 fL (ref 80.0–100.0)
Platelets: 245 10*3/uL (ref 150–400)
RBC: 3.89 MIL/uL — ABNORMAL LOW (ref 4.22–5.81)
RDW: 15.2 % (ref 11.5–15.5)
WBC: 8.3 10*3/uL (ref 4.0–10.5)
nRBC: 0 % (ref 0.0–0.2)

## 2021-03-12 LAB — BASIC METABOLIC PANEL
Anion gap: 5 (ref 5–15)
BUN: 10 mg/dL (ref 6–20)
CO2: 25 mmol/L (ref 22–32)
Calcium: 8.2 mg/dL — ABNORMAL LOW (ref 8.9–10.3)
Chloride: 109 mmol/L (ref 98–111)
Creatinine, Ser: 0.83 mg/dL (ref 0.61–1.24)
GFR, Estimated: >60 mL/min (ref >60)
Glucose, Bld: 78 mg/dL (ref 70–99)
Potassium: 3.9 mmol/L (ref 3.5–5.1)
Sodium: 139 mmol/L (ref 135–145)

## 2021-03-12 LAB — CBG MONITORING, ED
Glucose-Capillary: 216 mg/dL — ABNORMAL HIGH (ref 70–99)
Glucose-Capillary: 76 mg/dL (ref 70–99)
Glucose-Capillary: 84 mg/dL (ref 70–99)

## 2021-03-12 LAB — LEGIONELLA PNEUMOPHILA SEROGP 1 UR AG: L. pneumophila Serogp 1 Ur Ag: NEGATIVE

## 2021-03-12 MED ORDER — ADULT MULTIVITAMIN W/MINERALS CH
1.0000 | ORAL_TABLET | Freq: Every day | ORAL | Status: DC
  Administered 2021-03-12 – 2021-03-14 (×3): 1 via ORAL
  Filled 2021-03-12 (×3): qty 1

## 2021-03-12 MED ORDER — GABAPENTIN 100 MG PO CAPS
100.0000 mg | ORAL_CAPSULE | Freq: Every day | ORAL | Status: DC
  Administered 2021-03-12 – 2021-03-13 (×2): 100 mg via ORAL
  Filled 2021-03-12 (×2): qty 1

## 2021-03-12 MED ORDER — SODIUM CHLORIDE 0.9 % IV SOLN
INTRAVENOUS | Status: DC | PRN
  Administered 2021-03-12: 250 mL via INTRAVENOUS

## 2021-03-12 MED ORDER — ASCORBIC ACID 500 MG PO TABS
250.0000 mg | ORAL_TABLET | Freq: Two times a day (BID) | ORAL | Status: DC
  Administered 2021-03-12 – 2021-03-14 (×5): 250 mg via ORAL
  Filled 2021-03-12 (×5): qty 1

## 2021-03-12 MED ORDER — FLUTICASONE PROPIONATE 50 MCG/ACT NA SUSP
2.0000 | Freq: Every day | NASAL | Status: DC
  Administered 2021-03-14: 2 via NASAL
  Filled 2021-03-12 (×2): qty 16

## 2021-03-12 MED ORDER — CARVEDILOL 3.125 MG PO TABS
3.1250 mg | ORAL_TABLET | Freq: Two times a day (BID) | ORAL | Status: DC
  Administered 2021-03-12 – 2021-03-14 (×5): 3.125 mg via ORAL
  Filled 2021-03-12 (×5): qty 1

## 2021-03-12 NOTE — Progress Notes (Signed)
1        [facility] NAME: Joshua [name]    MR#:  [id number]  DATE OF BIRTH:  04-17-1971  SUBJECTIVE:  CHIEF COMPLAINT:   Chief Complaint  Patient presents with  . Chest Pain  . Generalized Body Aches  . Chills       Not too sure if he wants to get any amputation but is agreeable to talk with podiatry to discuss further.  He is more worried about his social situation/being homeless REVIEW OF SYSTEMS:  Review of Systems  Constitutional: Negative for diaphoresis, fever, malaise/fatigue and weight loss.  HENT: Negative for ear discharge, ear pain, hearing loss, nosebleeds, sore throat and tinnitus.   Eyes: Negative for blurred vision and pain.  Respiratory: Negative for cough, hemoptysis, shortness of breath and wheezing.   Cardiovascular: Negative for chest pain, palpitations, orthopnea and leg swelling.  Gastrointestinal: Negative for abdominal pain, blood in stool, constipation, diarrhea, heartburn, nausea and vomiting.  Genitourinary: Negative for dysuria, frequency and urgency.  Musculoskeletal: Positive for joint pain. Negative for back pain and myalgias.  Skin: Negative for itching and rash.  Neurological: Negative for dizziness, tingling, tremors, focal weakness, seizures, weakness and headaches.  Psychiatric/Behavioral: Negative for depression. The patient is not nervous/anxious.    DRUG ALLERGIES:  No Known Allergies VITALS:  Blood pressure 127/81, pulse 86, temperature 97.7 F (36.5 C), resp. rate 20, height 5\' 10"  (1.778 m), weight 85.8 kg, SpO2 100 %. PHYSICAL EXAMINATION:  Physical Exam  50 year old male lying in the bed comfortably not in acute distress Cardiovascular S1-S2 normal murmurs or gallop Lungs clear to auscultation bilaterally no wheezing rales rhonchi crepitation Abdomen soft nontender nondistended bowel sounds present Neuro alert and oriented Extremities: See picture      LABORATORY PANEL:   Male CBC Recent Labs  Lab 03/12/21 0401  WBC 8.3  HGB 10.3*  HCT 33.2*  PLT 245   ------------------------------------------------------------------------------------------------------------------ Chemistries  Recent Labs  Lab 03/11/21 1131 03/12/21 0401  NA 130* 139  K 4.3 3.9  CL 99 109  CO2 22 25  GLUCOSE 363* 78  BUN 12 10  CREATININE 0.93 0.83  CALCIUM 8.2* 8.2*  AST 17  --   ALT 12  --   ALKPHOS 76  --   BILITOT 0.8  --    RADIOLOGY:  US Venous Img Lower Bilateral (DVT)  Result Date: 03/11/2021 CLINICAL DATA:  Elevated D-dimer and leg pain EXAM: BILATERAL LOWER EXTREMITY VENOUS DOPPLER ULTRASOUND TECHNIQUE: Gray-scale sonography with graded compression, as well as color Doppler and duplex ultrasound were performed to evaluate the lower extremity deep venous systems from the level of the common femoral vein and including the common femoral, femoral, profunda femoral, popliteal and calf veins including the posterior tibial, peroneal and gastrocnemius veins when visible. The superficial great saphenous vein was also interrogated. Spectral Doppler was utilized to evaluate flow at rest and with distal augmentation maneuvers in the common femoral, femoral and popliteal veins. COMPARISON:  None. FINDINGS: RIGHT LOWER EXTREMITY Common Femoral Vein: No evidence of thrombus. Normal compressibility, respiratory phasicity and response to augmentation. Saphenofemoral Junction: No evidence of thrombus. Normal compressibility and flow on color Doppler imaging. Profunda Femoral Vein: No evidence of thrombus. Normal compressibility and flow on color Doppler imaging. Femoral Vein: No evidence of thrombus. Normal compressibility, respiratory phasicity and response to augmentation. Popliteal Vein: No evidence of thrombus. Normal compressibility, respiratory phasicity and response to augmentation. Calf Veins: No evidence of thrombus. Normal  compressibility and flow on color Doppler imaging.  Superficial Great Saphenous Vein: No evidence of thrombus. Normal compressibility. Venous Reflux:  None. Other Findings:  None. LEFT LOWER EXTREMITY Common Femoral Vein: No evidence of thrombus. Normal compressibility, respiratory phasicity and response to augmentation. Saphenofemoral Junction: No evidence of thrombus. Normal compressibility and flow on color Doppler imaging. Profunda Femoral Vein: No evidence of thrombus. Normal compressibility and flow on color Doppler imaging. Femoral Vein: No evidence of thrombus. Normal compressibility, respiratory phasicity and response to augmentation. Popliteal Vein: No evidence of thrombus. Normal compressibility, respiratory phasicity and response to augmentation. Calf Veins: No evidence of thrombus. Normal compressibility and flow on color Doppler imaging. Superficial Great Saphenous Vein: No evidence of thrombus. Normal compressibility. Venous Reflux:  None. Other Findings:  None. IMPRESSION: No evidence of deep venous thrombosis in either lower extremity. Electronically Signed   By: Inez Catalina M.D.   On: 03/11/2021 19:53   ASSESSMENT AND PLAN:  50 year old male with a known history of hypertension, diabetes, CHF with EF of 25 to 30%, CAD, cocaine abuse, anemia, tobacco abuse, recent admission for right greater toe osteomyelitis, homeless is admitted for chest pain and right foot pain.  In the ED he underwent CT chest which was negative for PE but showed bilateral pneumonia.  Osteomyelitis of the greater toe of the right foot Continue IV antibiotics Await podiatry input, patient still unsure about getting amputation done I have explained that he would benefit from getting amputation as recommended by podiatry in past.  He is agreeable to talk with podiatry  Sepsis due to healthcare associated pneumonia and osteomyelitis of the right great toe: Present on admission Continue IV antibiotic  Diabetes with hemoglobin A1c of 11.9 suggestive of poorly  controlled Continue sliding scale and Lantus.  Adjust as needed  Chronic combined systolic and diastolic heart failure with last echo in August 2021 showing EF of 25 to 30% with grade 3 diastolic dysfunction Well compensated at this time  Cocaine and marijuana abuse UDS positive for both on 3/29 Counseled but seem to be difficult social situation/homeless Consult TOC  CAD Continue Coreg, aspirin. Asymptomatic at this time  Hypertension Controlled at this time on Coreg  Body mass index is 27.13 kg/m.  Net IO Since Admission: 2,083.77 mL [03/12/21 1632]      Status is: Inpatient  Remains inpatient appropriate because:Unsafe d/c plan   Dispo: The patient is from: Home              Anticipated d/c is to: Home              Patient currently is not medically stable to d/c.   Difficult to place patient No       DVT prophylaxis:       heparin injection 5,000 Units Start: 03/11/21 2200     Family Communication: (Specify name, relationship & date discussed. NO "discussed with patient")   All the records are reviewed and case discussed with Care Management/Social Worker. Management plans discussed with the patient, family and they are in agreement.  CODE STATUS: Full Code Level of care: Med-Surg  TOTAL TIME TAKING CARE OF THIS PATIENT: 35 minutes.   More than 50% of the time was spent in counseling/coordination of care: YES  POSSIBLE D/C IN 2-3 DAYS, DEPENDING ON CLINICAL CONDITION.  And decision on amputation   Max Sane M.D on 03/12/2021 at 4:32 PM  [facility]   CC: Primary care physician; Patient, No Pcp Per (Inactive)  Note: This  dictation was prepared with Dragon dictation along with smaller phrase technology. Any transcriptional errors that result from this process are unintentional.

## 2021-03-12 NOTE — Plan of Care (Signed)

## 2021-03-12 NOTE — Consult Note (Signed)
PODIATRY / FOOT AND ANKLE SURGERY CONSULTATION NOTE  Requesting Physician: Ivor Costa MD  Reason for consult: Right toe infection  Chief Complaint: Right toe infection   HPI: Joshua [name] is a 50 y.o. male who presented to [facility] due to multiple issues.  Patient was diagnosed with pneumonia.  Patient was recently also diagnosed with osteomyelitis to his right big toe but refused amputation at his last admission.  Podiatry team was consulted for further evaluation of right hallux.  Patient presents resting in bed comfortably today.  Patient does not of any pain to the right big toe.  He also does not have any dressings on his right toe as well.  He has noted no purulence to the area.  Patient is fairly noncommunicative at today's visit and when asked questions directly he does not give straight answers.  Patient is homeless and is worried about his social situation after he could potentially have the toe amputated.  PMHx:  Past Medical History:  Diagnosis Date  . CHF (congestive heart failure) ([place])   . Diabetes mellitus without complication ([place])   . Hypertension     Surgical Hx:  Past Surgical History:  Procedure Laterality Date  . CORONARY/GRAFT ACUTE MI REVASCULARIZATION N/A 09/09/2018   Procedure: Coronary/Graft Acute MI Revascularization;  Surgeon: Yolonda Kida, MD;  Location: [facility];  Service: Cardiovascular;  Laterality: N/A;  . LEFT HEART CATH AND CORONARY ANGIOGRAPHY N/A 09/09/2018   Procedure: LEFT HEART CATH AND CORONARY ANGIOGRAPHY;  Surgeon: Yolonda Kida, MD;  Location: [facility];  Service: Cardiovascular;  Laterality: N/A;  . LOWER EXTREMITY ANGIOGRAPHY Right 02/14/2021   Procedure: Lower Extremity Angiography;  Surgeon: Katha Cabal, MD;  Location: [facility];  Service: Cardiovascular;  Laterality: Right;  . none    . STENT PLACEMENT VASCULAR ([facility] HX)      FHx:  Family History  Problem  Relation Age of Onset  . Cancer Mother   . CAD Brother     Social History:  reports that he has been smoking. He has never used smokeless tobacco. He reports current alcohol use. He reports previous drug use.  Allergies: No Known Allergies  Medications Prior to Admission  Medication Sig Dispense Refill  . ascorbic acid (VITAMIN C) 250 MG tablet Take 1 tablet (250 mg total) by mouth 2 (two) times daily. 60 tablet 1  . carvedilol (COREG) 3.125 MG tablet Take 1 tablet (3.125 mg total) by mouth 2 (two) times daily with a meal. 60 tablet 1  . dapagliflozin propanediol (FARXIGA) 5 MG TABS tablet Take 1 tablet (5 mg total) by mouth daily. 30 tablet 1  . fluticasone (FLONASE) 50 MCG/ACT nasal spray Place 2 sprays into both nostrils daily. 16 g 0  . furosemide (LASIX) 20 MG tablet Take 1 tablet (20 mg total) by mouth daily as needed (for weight gain >3lbs in 1 days and >5lbs in 2 days). 30 tablet 1  . gabapentin (NEURONTIN) 100 MG capsule Take 1 capsule (100 mg total) by mouth at bedtime. 30 capsule 1  . insulin glargine (LANTUS) 100 UNIT/ML Solostar Pen Inject 40 Units into the skin daily. 15 mL 11  . Insulin Pen Needle (PEN NEEDLES) 31G X 5 MM MISC 40 Units by Does not apply route at bedtime. 100 each 1  . losartan (COZAAR) 25 MG tablet Take 1 tablet (25 mg total) by mouth daily. 30 tablet 1  . metFORMIN (GLUCOPHAGE) 500 MG  tablet Take 1 tablet (500 mg total) by mouth daily with breakfast. 30 tablet 1  . Multiple Vitamin (MULTIVITAMIN WITH MINERALS) TABS tablet Take 1 tablet by mouth daily. 90 tablet 1  . spironolactone (ALDACTONE) 25 MG tablet Take 1 tablet (25 mg total) by mouth daily. 30 tablet 1    Physical Exam: General: Alert and oriented.  No apparent distress.  Argumentative.  Vascular: DP/PT pulses palpable bilateral.  Hair growth noted to digits bilateral.  Neuro: Light touch sensation reduced to bilateral lower extremities.  Derm: Right hallux distally does not appear to have any  open ulceration at this time.  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  Dried blood is present to the distal tip of the toe and lateral nail fold.  The right hallux distally does appear to be swollen with mild erythema.  No obvious openings or bone exposed at this time    MSK: No pain on palpation of the right hallux.  Results for orders placed or performed during the hospital encounter of 03/11/21 (from the past 48 hour(s))  CBC     Status: Abnormal   Collection Time: 03/11/21 11:31 AM  Result Value Ref Range   WBC 13.2 (H) 4.0 - 10.5 K/uL   RBC 4.16 (L) 4.22 - 5.81 MIL/uL   Hemoglobin 11.0 (L) 13.0 - 17.0 g/dL   HCT 35.0 (L) 39.0 - 52.0 %   MCV 84.1 80.0 - 100.0 fL   MCH 26.4 26.0 - 34.0 pg   MCHC 31.4 30.0 - 36.0 g/dL   RDW 15.3 11.5 - 15.5 %   Platelets 255 150 - 400 K/uL   nRBC 0.0 0.0 - 0.2 %    Comment: Performed at [facility], [place], [place] [address]  Troponin I (High Sensitivity)     Status: None   Collection Time: 03/11/21 11:31 AM  Result Value Ref Range   Troponin I (High Sensitivity) 6 <18 ng/L    Comment: (NOTE) Elevated high sensitivity troponin I (hsTnI) values and significant  changes across serial measurements may suggest ACS but many other  chronic and acute conditions are known to elevate hsTnI results.  Refer to the "Links" section for chest pain algorithms and additional  guidance. Performed at [facility], [address] [place], [place] [address]   Comprehensive metabolic panel     Status: Abnormal   Collection Time: 03/11/21 11:31 AM  Result Value Ref Range   Sodium 130 (L) 135 - 145 mmol/L   Potassium 4.3 3.5 - 5.1 mmol/L   Chloride 99 98 - 111 mmol/L   CO2 22 22 - 32 mmol/L   Glucose, Bld 363 (H) 70 - 99 mg/dL    Comment: Glucose reference range applies only to samples taken after fasting for at least 8 hours.   BUN 12 6 - 20 mg/dL   Creatinine, Ser 0.93 0.61 - 1.24 mg/dL   Calcium  8.2 (L) 8.9 - 10.3 mg/dL   Total Protein 6.9 6.5 - 8.1 g/dL   Albumin 3.1 (L) 3.5 - 5.0 g/dL   AST 17 15 - 41 U/L   ALT 12 0 - 44 U/L   Alkaline Phosphatase 76 38 - 126 U/L   Total Bilirubin 0.8 0.3 - 1.2 mg/dL   GFR, Estimated >60 >60 mL/min    Comment: (NOTE) Calculated using the CKD-EPI Creatinine Equation (2021)    Anion gap 9 5 - 15    Comment: Performed at [facility], [place], [place] [address]  Procalcitonin - Baseline     Status: None   Collection Time: 03/11/21 11:31 AM  Result Value Ref Range   Procalcitonin 0.21 ng/mL    Comment:        Interpretation: PCT (Procalcitonin) <= 0.5 ng/mL: Systemic infection (sepsis) is not likely. Local bacterial infection is possible. (NOTE)       Sepsis PCT Algorithm           Lower Respiratory Tract                                      Infection PCT Algorithm    ----------------------------     ----------------------------         PCT < 0.25 ng/mL                PCT < 0.10 ng/mL          Strongly encourage             Strongly discourage   discontinuation of antibiotics    initiation of antibiotics    ----------------------------     -----------------------------       PCT 0.25 - 0.50 ng/mL            PCT 0.10 - 0.25 ng/mL               OR       >80% decrease in PCT            Discourage initiation of                                            antibiotics      Encourage discontinuation           of antibiotics    ----------------------------     -----------------------------         PCT >= 0.50 ng/mL              PCT 0.26 - 0.50 ng/mL               AND        <80% decrease in PCT             Encourage initiation of                                             antibiotics       Encourage continuation           of antibiotics    ----------------------------     -----------------------------        PCT >= 0.50 ng/mL                  PCT > 0.50 ng/mL               AND         increase in PCT                   Strongly encourage  initiation of antibiotics    Strongly encourage escalation           of antibiotics                                     -----------------------------                                           PCT <= 0.25 ng/mL                                                 OR                                        > 80% decrease in PCT                                      Discontinue / Do not initiate                                             antibiotics  Performed at [facility], [address] [place], [place] [address]   Hemoglobin A1c     Status: Abnormal   Collection Time: 03/11/21 11:31 AM  Result Value Ref Range   Hgb A1c MFr Bld 11.2 (H) 4.8 - 5.6 %    Comment: (NOTE) Pre diabetes:          5.7%-6.4%  Diabetes:              >6.4%  Glycemic control for   <7.0% adults with diabetes    Mean Plasma Glucose 274.74 mg/dL    Comment: Performed at [facility] [address], [place], [place] [address]  Lactic acid, plasma     Status: None   Collection Time: 03/11/21  1:32 PM  Result Value Ref Range   Lactic Acid, Venous 1.3 0.5 - 1.9 mmol/L    Comment: Performed at [facility], [address] [place], [place] [address]  Urinalysis, Complete w Microscopic     Status: Abnormal   Collection Time: 03/11/21  1:32 PM  Result Value Ref Range   Color, Urine YELLOW (A) YELLOW   APPearance CLEAR (A) CLEAR   Specific Gravity, Urine 1.028 1.005 - 1.030   pH 6.0 5.0 - 8.0   Glucose, UA >=500 (A) NEGATIVE mg/dL   Hgb urine dipstick NEGATIVE NEGATIVE   Bilirubin Urine NEGATIVE NEGATIVE   Ketones, ur NEGATIVE NEGATIVE mg/dL   Protein, ur NEGATIVE NEGATIVE mg/dL   Nitrite NEGATIVE NEGATIVE   Leukocytes,Ua NEGATIVE NEGATIVE   RBC / HPF 0-5 0 - 5 RBC/hpf   WBC, UA 0-5 0 - 5 WBC/hpf   Bacteria, UA NONE SEEN NONE SEEN   Squamous Epithelial / LPF 0-5 0 - 5   Mucus PRESENT     Comment: Performed at [facility], [address] [place], [place] [address]  D-dimer, quantitative     Status: Abnormal   Collection Time: 03/11/21  1:32 PM  Result Value Ref Range   D-Dimer, Quant 0.75 (H) 0.00 - 0.50 ug/mL-FEU    Comment: (NOTE) At the manufacturer cut-off value of 0.5 g/mL FEU, this assay has a negative predictive value of 95-100%.This assay is intended for use in conjunction with a clinical pretest probability (PTP) assessment model to exclude pulmonary embolism (PE) and deep venous thrombosis (DVT) in outpatients suspected of PE or DVT. Results should be correlated with clinical presentation. Performed at [facility], [address] [place], [place] [address]   HIV Antibody (routine testing w rflx)     Status: None   Collection Time: 03/11/21  1:32 PM  Result Value Ref Range   HIV Screen 4th Generation wRfx Non Reactive Non Reactive    Comment: Performed at [facility], [place] [address] [place], [place] [address]  Troponin I (High Sensitivity)     Status: None   Collection Time: 03/11/21  1:32 PM  Result Value Ref Range   Troponin I (High Sensitivity) 7 <18 ng/L    Comment: (NOTE) Elevated high sensitivity troponin I (hsTnI) values and significant  changes across serial measurements may suggest ACS but many other  chronic and acute conditions are known to elevate hsTnI results.  Refer to the "Links" section for chest pain algorithms and additional  guidance. Performed at [facility], [address] [place], [place] [address]   Sedimentation rate     Status: Abnormal   Collection Time: 03/11/21  1:32 PM  Result Value Ref Range   Sed Rate 69 (H) 0 - 15 mm/hr    Comment: Performed at [facility], [address] [place], [place] [address]  C-reactive protein     Status: Abnormal   Collection Time: 03/11/21  1:32 PM  Result Value Ref Range   CRP 21.5 (H) <1.0 mg/dL    Comment: Performed at [facility], [place] [address]  [place], [place] [address]  Blood culture (routine x 2)     Status: None (Preliminary result)   Collection Time: 03/11/21  1:32 PM   Specimen: BLOOD  Result Value Ref Range   Specimen Description BLOOD BLOOD RIGHT FOREARM    Special Requests      BOTTLES DRAWN AEROBIC AND ANAEROBIC Blood Culture adequate volume   Culture      NO GROWTH < 24 HOURS Performed at [facility], [address], [place], [place] [address]    Report Status PENDING   Blood culture (routine x 2)     Status: None (Preliminary result)   Collection Time: 03/11/21  1:32 PM   Specimen: BLOOD  Result Value Ref Range   Specimen Description BLOOD BLOOD RIGHT WRIST    Special Requests      BOTTLES DRAWN AEROBIC AND ANAEROBIC Blood Culture results may not be optimal due to an inadequate volume of blood received in culture bottles   Culture      NO GROWTH < 24 HOURS Performed at [facility], [address], [place], [place] [address]    Report Status PENDING   Urine Drug Screen, Qualitative ([place] only)     Status: Abnormal   Collection Time: 03/11/21  1:32 PM  Result Value Ref Range   Tricyclic, Ur Screen NONE DETECTED NONE DETECTED   Amphetamines, Ur Screen NONE DETECTED NONE DETECTED   MDMA (Ecstasy)Ur Screen  NONE DETECTED NONE DETECTED   Cocaine Metabolite,Ur [place] POSITIVE (A) NONE DETECTED   Opiate, Ur Screen NONE DETECTED NONE DETECTED   Phencyclidine (PCP) Ur S NONE DETECTED NONE DETECTED   Cannabinoid 50 Ng, Ur [place] POSITIVE (A) NONE DETECTED   Barbiturates, Ur Screen NONE DETECTED NONE DETECTED   Benzodiazepine, Ur Scrn NONE DETECTED NONE DETECTED   Methadone Scn, Ur NONE DETECTED NONE DETECTED    Comment: (NOTE) Tricyclics + metabolites, urine    Cutoff 1000 ng/mL Amphetamines + metabolites, urine  Cutoff 1000 ng/mL MDMA (Ecstasy), urine              Cutoff 500 ng/mL Cocaine Metabolite, urine          Cutoff 300 ng/mL Opiate + metabolites, urine        Cutoff 300 ng/mL Phencyclidine (PCP),  urine         Cutoff 25 ng/mL Cannabinoid, urine                 Cutoff 50 ng/mL Barbiturates + metabolites, urine  Cutoff 200 ng/mL Benzodiazepine, urine              Cutoff 200 ng/mL Methadone, urine                   Cutoff 300 ng/mL  The urine drug screen provides only a preliminary, unconfirmed analytical test result and should not be used for non-medical purposes. Clinical consideration and professional judgment should be applied to any positive drug screen result due to possible interfering substances. A more specific alternate chemical method must be used in order to obtain a confirmed analytical result. Gas chromatography / mass spectrometry (GC/MS) is the preferred confirm atory method. Performed at [facility], [address] [place], [place] [address]   Brain natriuretic peptide     Status: Abnormal   Collection Time: 03/11/21  1:32 PM  Result Value Ref Range   B Natriuretic Peptide 173.8 (H) 0.0 - 100.0 pg/mL    Comment: Performed at [facility] [address] [place], [place] [address]  Protime-INR     Status: None   Collection Time: 03/11/21  1:32 PM  Result Value Ref Range   Prothrombin Time 14.6 11.4 - 15.2 seconds   INR 1.2 0.8 - 1.2    Comment: (NOTE) INR goal varies based on device and disease states. Performed at [facility], [address] [place], [place] [address]   APTT     Status: Abnormal   Collection Time: 03/11/21  1:32 PM  Result Value Ref Range   aPTT 41 (H) 24 - 36 seconds    Comment:        IF BASELINE aPTT IS ELEVATED, SUGGEST PATIENT RISK ASSESSMENT BE USED TO DETERMINE APPROPRIATE ANTICOAGULANT THERAPY. Performed at [facility], [address] [place], [place] [address]   Strep pneumoniae urinary antigen     Status: None   Collection Time: 03/11/21  1:32 PM  Result Value Ref Range   Strep Pneumo Urinary Antigen NEGATIVE NEGATIVE    Comment:        Infection due to S. pneumoniae cannot  be absolutely ruled out since the antigen present may be below the detection limit of the test. Performed at [facility], [address], [place], [place] [address]   Resp Panel by RT-PCR (Flu A&B, Covid) Nasopharyngeal Swab     Status: None   Collection Time: 03/11/21  1:40 PM   Specimen:  Nasopharyngeal Swab; Nasopharyngeal(NP) swabs in vial transport medium  Result Value Ref Range   SARS Coronavirus 2 by RT PCR NEGATIVE NEGATIVE    Comment: (NOTE) SARS-CoV-2 target nucleic acids are NOT DETECTED.  The SARS-CoV-2 RNA is generally detectable in upper respiratory specimens during the acute phase of infection. The lowest concentration of SARS-CoV-2 viral copies this assay can detect is 138 copies/mL. A negative result does not preclude SARS-Cov-2 infection and should not be used as the sole basis for treatment or other patient management decisions. A negative result may occur with  improper specimen collection/handling, submission of specimen other than nasopharyngeal swab, presence of viral mutation(s) within the areas targeted by this assay, and inadequate number of viral copies(<138 copies/mL). A negative result must be combined with clinical observations, patient history, and epidemiological information. The expected result is Negative.  Fact Sheet for Patients:  EntrepreneurPulse.com.au  Fact Sheet for Healthcare Providers:  IncredibleEmployment.be  This test is no t yet approved or cleared by the [place] FDA and  has been authorized for detection and/or diagnosis of SARS-CoV-2 by FDA under an Emergency Use Authorization (EUA). This EUA will remain  in effect (meaning this test can be used) for the duration of the COVID-19 declaration under Section 564(b)(1) of the Act, 21 U.S.C.section 360bbb-3(b)(1), unless the authorization is terminated  or revoked sooner.       Influenza A by PCR NEGATIVE NEGATIVE   Influenza B by PCR  NEGATIVE NEGATIVE    Comment: (NOTE) The Xpert Xpress SARS-CoV-2/FLU/RSV plus assay is intended as an aid in the diagnosis of influenza from Nasopharyngeal swab specimens and should not be used as a sole basis for treatment. Nasal washings and aspirates are unacceptable for Xpert Xpress SARS-CoV-2/FLU/RSV testing.  Fact Sheet for Patients: EntrepreneurPulse.com.au  Fact Sheet for Healthcare Providers: IncredibleEmployment.be  This test is not yet approved or cleared by the [place] FDA and has been authorized for detection and/or diagnosis of SARS-CoV-2 by FDA under an Emergency Use Authorization (EUA). This EUA will remain in effect (meaning this test can be used) for the duration of the COVID-19 declaration under Section 564(b)(1) of the Act, 21 U.S.C. section 360bbb-3(b)(1), unless the authorization is terminated or revoked.  Performed at [facility], [address] [place], [place] [address]   CBG monitoring, ED     Status: Abnormal   Collection Time: 03/11/21  3:15 PM  Result Value Ref Range   Glucose-Capillary 181 (H) 70 - 99 mg/dL    Comment: Glucose reference range applies only to samples taken after fasting for at least 8 hours.  CBG monitoring, ED     Status: Abnormal   Collection Time: 03/11/21  8:04 PM  Result Value Ref Range   Glucose-Capillary 159 (H) 70 - 99 mg/dL    Comment: Glucose reference range applies only to samples taken after fasting for at least 8 hours.  CBG monitoring, ED     Status: Abnormal   Collection Time: 03/11/21 10:10 PM  Result Value Ref Range   Glucose-Capillary 201 (H) 70 - 99 mg/dL    Comment: Glucose reference range applies only to samples taken after fasting for at least 8 hours.  Culture, sputum-assessment     Status: None   Collection Time: 03/11/21 10:12 PM   Specimen: Sputum  Result Value Ref Range   Specimen Description SPUTUM    Special Requests NONE    Sputum evaluation       THIS SPECIMEN IS ACCEPTABLE FOR SPUTUM CULTURE  Performed at [facility], [address] [place], [place] [address]    Report Status 03/11/2021 FINAL   Culture, Respiratory w Gram Stain     Status: None (Preliminary result)   Collection Time: 03/11/21 10:12 PM   Specimen: SPU  Result Value Ref Range   Specimen Description      SPUTUM Performed at [facility], [address], [place], [place] [address]    Special Requests      NONE Reflexed from [id number] Performed at [facility], [address] [place], [place] [address]    Gram Stain      RARE WBC PRESENT,BOTH PMN AND MONONUCLEAR NO ORGANISMS SEEN Performed at [facility], [place] [address] [place], [place] [address]    Culture PENDING    Report Status PENDING   CBG monitoring, ED     Status: Abnormal   Collection Time: 03/12/21 12:08 AM  Result Value Ref Range   Glucose-Capillary 216 (H) 70 - 99 mg/dL    Comment: Glucose reference range applies only to samples taken after fasting for at least 8 hours.  Basic metabolic panel     Status: Abnormal   Collection Time: 03/12/21  4:01 AM  Result Value Ref Range   Sodium 139 135 - 145 mmol/L   Potassium 3.9 3.5 - 5.1 mmol/L   Chloride 109 98 - 111 mmol/L   CO2 25 22 - 32 mmol/L   Glucose, Bld 78 70 - 99 mg/dL    Comment: Glucose reference range applies only to samples taken after fasting for at least 8 hours.   BUN 10 6 - 20 mg/dL   Creatinine, Ser 0.83 0.61 - 1.24 mg/dL   Calcium 8.2 (L) 8.9 - 10.3 mg/dL   GFR, Estimated >60 >60 mL/min    Comment: (NOTE) Calculated using the CKD-EPI Creatinine Equation (2021)    Anion gap 5 5 - 15    Comment: Performed at [facility], [address] [place], [place] [address]  CBC     Status: Abnormal   Collection Time: 03/12/21  4:01 AM  Result Value Ref Range   WBC 8.3 4.0 - 10.5 K/uL   RBC 3.89 (L) 4.22 - 5.81 MIL/uL   Hemoglobin 10.3 (L) 13.0 - 17.0 g/dL   HCT 33.2 (L) 39.0 - 52.0  %   MCV 85.3 80.0 - 100.0 fL   MCH 26.5 26.0 - 34.0 pg   MCHC 31.0 30.0 - 36.0 g/dL   RDW 15.2 11.5 - 15.5 %   Platelets 245 150 - 400 K/uL   nRBC 0.0 0.0 - 0.2 %    Comment: Performed at [facility], [address] [place], [place] [address]  CBG monitoring, ED     Status: None   Collection Time: 03/12/21  4:30 AM  Result Value Ref Range   Glucose-Capillary 84 70 - 99 mg/dL    Comment: Glucose reference range applies only to samples taken after fasting for at least 8 hours.  CBG monitoring, ED     Status: None   Collection Time: 03/12/21  7:16 AM  Result Value Ref Range   Glucose-Capillary 76 70 - 99 mg/dL    Comment: Glucose reference range applies only to samples taken after fasting for at least 8 hours.  Glucose, capillary     Status: Abnormal   Collection Time: 03/12/21 12:14 PM  Result Value Ref Range   Glucose-Capillary 205 (H) 70 - 99 mg/dL    Comment:  Glucose reference range applies only to samples taken after fasting for at least 8 hours.   Comment 1 Notify RN    DG Chest 2 View  Result Date: 03/11/2021 CLINICAL DATA:  Chest pain EXAM: CHEST - 2 VIEW COMPARISON:  10/03/2020 FINDINGS: The heart size and mediastinal contours are within normal limits. Both lungs are clear. The visualized skeletal structures are unremarkable. IMPRESSION: No acute abnormality of the lungs. Electronically Signed   By: Eddie Candle M.D.   On: 03/11/2021 12:27   CT Angio Chest PE W and/or Wo Contrast  Result Date: 03/11/2021 CLINICAL DATA:  Chest pain EXAM: CT ANGIOGRAPHY CHEST WITH CONTRAST TECHNIQUE: Multidetector CT imaging of the chest was performed using the standard protocol during bolus administration of intravenous contrast. Multiplanar CT image reconstructions and MIPs were obtained to evaluate the vascular anatomy. CONTRAST:  55mL OMNIPAQUE IOHEXOL 350 MG/ML SOLN COMPARISON:  Chest radiograph March 11, 2021 FINDINGS: Cardiovascular: There is no demonstrable pulmonary embolus.  There is no thoracic aortic aneurysm or dissection. Visualized great vessels appear normal. Right innominate and left common carotid arteries arise as a common trunk, there is calcification in multiple coronary arteries. There is no pericardial effusion or pericardial thickening. Mediastinum/Nodes: Thyroid appears normal. No evident thoracic adenopathy. Small hiatal hernia noted. Lungs/Pleura: There is ill-defined airspace opacity in portions of each lower lobe, with airspace opacity in portions of all segments of the right lower lobe and in portions of the superior segment, posterior segment, and lateral segment left lower lobe. Upper lobes and right middle lobe are clear. No pleural effusions are evident. No pneumothorax. Trachea and major bronchial structures are patent. Upper Abdomen: Stomach mildly distended with fluid and air. Visualized upper abdominal structures otherwise appear unremarkable. Musculoskeletal: No blastic or lytic bone lesions. No evident chest wall lesions. Review of the MIP images confirms the above findings. IMPRESSION: 1. no demonstrable pulmonary embolus. No thoracic aortic aneurysm or dissection. There are multiple foci of coronary artery calcification. 2. Areas of pneumonia in each lower lobe, slightly more severe on the right than the left. No pleural effusions. 3.  Small hiatal hernia. 4.  No evident adenopathy. Electronically Signed   By: Lowella Grip III M.D.   On: 03/11/2021 15:15   US Venous Img Lower Bilateral (DVT)  Result Date: 03/11/2021 CLINICAL DATA:  Elevated D-dimer and leg pain EXAM: BILATERAL LOWER EXTREMITY VENOUS DOPPLER ULTRASOUND TECHNIQUE: Gray-scale sonography with graded compression, as well as color Doppler and duplex ultrasound were performed to evaluate the lower extremity deep venous systems from the level of the common femoral vein and including the common femoral, femoral, profunda femoral, popliteal and calf veins including the posterior tibial,  peroneal and gastrocnemius veins when visible. The superficial great saphenous vein was also interrogated. Spectral Doppler was utilized to evaluate flow at rest and with distal augmentation maneuvers in the common femoral, femoral and popliteal veins. COMPARISON:  None. FINDINGS: RIGHT LOWER EXTREMITY Common Femoral Vein: No evidence of thrombus. Normal compressibility, respiratory phasicity and response to augmentation. Saphenofemoral Junction: No evidence of thrombus. Normal compressibility and flow on color Doppler imaging. Profunda Femoral Vein: No evidence of thrombus. Normal compressibility and flow on color Doppler imaging. Femoral Vein: No evidence of thrombus. Normal compressibility, respiratory phasicity and response to augmentation. Popliteal Vein: No evidence of thrombus. Normal compressibility, respiratory phasicity and response to augmentation. Calf Veins: No evidence of thrombus. Normal compressibility and flow on color Doppler imaging. Superficial Great Saphenous Vein: No evidence of thrombus. Normal compressibility. Venous  Reflux:  None. Other Findings:  None. LEFT LOWER EXTREMITY Common Femoral Vein: No evidence of thrombus. Normal compressibility, respiratory phasicity and response to augmentation. Saphenofemoral Junction: No evidence of thrombus. Normal compressibility and flow on color Doppler imaging. Profunda Femoral Vein: No evidence of thrombus. Normal compressibility and flow on color Doppler imaging. Femoral Vein: No evidence of thrombus. Normal compressibility, respiratory phasicity and response to augmentation. Popliteal Vein: No evidence of thrombus. Normal compressibility, respiratory phasicity and response to augmentation. Calf Veins: No evidence of thrombus. Normal compressibility and flow on color Doppler imaging. Superficial Great Saphenous Vein: No evidence of thrombus. Normal compressibility. Venous Reflux:  None. Other Findings:  None. IMPRESSION: No evidence of deep venous  thrombosis in either lower extremity. Electronically Signed   By: Inez Catalina M.D.   On: 03/11/2021 19:53   DG Foot Complete Right  Result Date: 03/11/2021 CLINICAL DATA:  Foot pain EXAM: RIGHT FOOT COMPLETE - 3+ VIEW COMPARISON:  None. FINDINGS: No acute fracture or dislocation. Mild irregularity along the distal medial tuft of the first distal phalanx. Soft tissues are unremarkable. IMPRESSION: No acute osseous injury of the right foot. Mild irregularity along the distal medial tuft of the first distal phalanx concerning for osteomyelitis. Electronically Signed   By: Kathreen Devoid   On: 03/11/2021 13:54    Blood pressure (!) 114/95, pulse 78, temperature 98.2 F (36.8 C), temperature source Oral, resp. rate 16, height 5\' 10"  (1.778 m), weight 85.8 kg, SpO2 100 %.  Assessment 1. Osteomyelitis right hallux, chronic 2. Diabetes type 2 polyneuropathy, uncontrolled, A1c 11.2% 3. PVD 4. Homeless 5. History of tobacco abuse  Plan -Patient seen and examined. -X-ray imaging as well as previous MRI imaging reviewed and discussed with patient in detail showing osteomyelitis to the right distal phalanx of the hallux. -Once again discussed treatment options with patient. -Discussed with patient that the recommendation for treatment would be a partial right hallux amputation.  Patient became argumentative still at that point after discussing the amputation in detail.  Discussed with patient that since he does have a history of smoking as well as uncontrolled diabetes that performing a right hallux amputation may not necessarily heal due to these issues.  Patient's most recent CT angiogram showed good enough blood flow to heal an amputation of this area. -Betadine paint applied to toe today.  Patient should be performing Betadine paints to the area daily. -Would like to perform amputation at some point in the hospital during stay once patient is more stable from pneumonia standpoint.  Unsure if patient  will like to proceed with procedure.  If he declines then would recommend continuation of antibiotic therapy. -Let patient know that leaving the infection in his toe could continue to make him more sick and he could potentially lose more of his foot due to this and potentially become septic and lead to loss of life.  Let patient know risks. -We will follow-up with patient again tomorrow.  Would like to perform amputation on Friday but will leave this up to Dr. Vickki Muff.  Caroline More, DPM 03/12/2021, 1:03 PM

## 2021-03-13 ENCOUNTER — Encounter: Payer: Self-pay | Admitting: Internal Medicine

## 2021-03-13 LAB — GLUCOSE, CAPILLARY
Glucose-Capillary: 249 mg/dL — ABNORMAL HIGH (ref 70–99)
Glucose-Capillary: 130 mg/dL — ABNORMAL HIGH (ref 70–99)
Glucose-Capillary: 290 mg/dL — ABNORMAL HIGH (ref 70–99)
Glucose-Capillary: 308 mg/dL — ABNORMAL HIGH (ref 70–99)

## 2021-03-13 LAB — CREATININE, SERUM
Creatinine, Ser: 0.89 mg/dL (ref 0.61–1.24)
GFR, Estimated: >60 mL/min (ref >60)

## 2021-03-13 MED ORDER — INSULIN GLARGINE 100 UNIT/ML ~~LOC~~ SOLN
30.0000 [IU] | Freq: Every day | SUBCUTANEOUS | Status: DC
  Administered 2021-03-13: 30 [IU] via SUBCUTANEOUS
  Filled 2021-03-13 (×3): qty 0.3

## 2021-03-13 NOTE — Progress Notes (Signed)
Daily Progress Note   Subjective  - * No surgery found *  Follow-up great toe osteomyelitis.  History of ulceration.  Patient well-known to me.  I evaluated him in his last hospitalization.  MRI at that time showed osteomyelitis of the distal aspect of the great toe.  Recommended amputation at that time.  Patient deferred amputation and wanted to wait until his home situation was settled.  Did not follow-up.  Patient claims that he was not prescribed antibiotics nor insulin.  He has not been on either since his last discharge.  Admitted with diagnosis of healthcare associated pneumonia.  Also osteomyelitis of the great toe.  Urine drug analysis revealed cocaine in his system.  Objective Vitals:   03/12/21 1933 03/13/21 0031 03/13/21 0414 03/13/21 0803  BP: 108/79 123/71 119/77 117/81  Pulse: 88 87 68 84  Resp: 18 19 18 15   Temp: 98.9 F (37.2 C) 98.3 F (36.8 C) 98.1 F (36.7 C) 97.7 F (36.5 C)  TempSrc: Oral     SpO2: 95% 94% 100% 99%  Weight:      Height:        Physical Exam: The distal great toe ulceration is stable at this point.  There is hyperkeratosis.  There is still diffuse edema to the great toe.  No open drainage at this time.  I reviewed x-rays that are negative for any obvious erosive change on the distal aspect of the great toe.  MRI previously has shown edema within the bone consistent with osteomyelitis.  Laboratory CBC    Component Value Date/Time   WBC 8.3 03/12/2021 0401   HGB 10.3 (L) 03/12/2021 0401   HGB 15.6 09/21/2014 1142   HCT 33.2 (L) 03/12/2021 0401   HCT 47.9 09/21/2014 1142   PLT 245 03/12/2021 0401   PLT 281 09/21/2014 1142    BMET    Component Value Date/Time   NA 139 03/12/2021 0401   NA 138 09/21/2014 1142   K 3.9 03/12/2021 0401   K 4.1 09/21/2014 1142   CL 109 03/12/2021 0401   CL 103 09/21/2014 1142   CO2 25 03/12/2021 0401   CO2 27 09/21/2014 1142   GLUCOSE 78 03/12/2021 0401   GLUCOSE 260 (H) 09/21/2014 1142   BUN 10  03/12/2021 0401   BUN 5 (L) 09/21/2014 1142   CREATININE 0.89 03/13/2021 0419   CREATININE 0.96 09/21/2014 1142   CALCIUM 8.2 (L) 03/12/2021 0401   CALCIUM 9.2 09/21/2014 1142   GFRNONAA >60 03/13/2021 0419   GFRNONAA >60 09/21/2014 1142   GFRAA >60 08/02/2020 0501   GFRAA >60 09/21/2014 1142    Assessment/Planning: Osteomyelitis distal great toe Diabetes with neuropathy Compliant Recent pneumonia History of drug abuse   I had a long discussion with the patient today.  Still my recommendation is for amputation of the distal aspect of the great toe.  He is still resistant to surgical intervention.  He would like to try antibiotics and continue to try to get his home situation in a more stable place.  He states he was not provided a follow-up with me though he did have a follow-up on my schedule did not make it.  I would recommend continue with oral antibiotics upon discharge.  Patient can follow-up with me outpatient and we can schedule surgery as outpatient.  Would have to wait on clearance of cocaine from his system as well as clearance of his pneumonia prior to surgery at this time.  If patient changes his mind  and would elect for surgical intervention upon this admission please reconsult.  Patient can continue with bandaging to the toe but there are is minimal drainage and no real open wound.  Just keep the toe protected for now.    Samara Deist A  03/13/2021, 1:13 PM

## 2021-03-13 NOTE — Progress Notes (Signed)
Podiatry considering Amputation of great toe if patient in agreement

## 2021-03-13 NOTE — Progress Notes (Signed)
Met with the patient at the bedside to discuss DC plan and needs He stated that he stays with friends or family He was given [employer] and [email or web address] information, it was explained the him that we could set him up with a homeless shelter, he stated that he does not want to do that, He stated that he does have Medicaid and they provide medications, he can use Medicaid transportation He has applied for disability, he said he has no other needs

## 2021-03-13 NOTE — Progress Notes (Addendum)
Progress Note    Joshua [name]  LNL:[id number] DOB: 1971/07/16  DOA: 03/11/2021 PCP: Patient, No Pcp Per (Inactive)      Brief Narrative:    Medical records reviewed and are as summarized below:  Joshua [name] is a 50 y.o. male with medical history of hypertension, insulin-dependent diabetes mellitus, chronic systolic and diastolic CHF with EF of 25 to 30%, CAD, cocaine abuse, chronic anemia, tobacco use disorder, recent admission for right great toe osteomyelitis (hospitalized from 02/12/2021 to 02/19/2021), homelessness.  He presented to the hospital with chest pain, generalized body aches and right foot pain.  He was found to have multifocal pneumonia and osteomyelitis of the right great toe.  He was treated with empiric IV antibiotics.  Podiatrist was consulted for evaluation of osteomyelitis of the right great toe.  It is worth noting that on his recent admission, amputation of the distal third of the right great toe was recommended but patient refused to have the procedure done.  Podiatrist reiterated the need for amputation of the right great toe to treat his osteomyelitis but patient still declined to have the procedure done.    Assessment/Plan:   Principal Problem:   Multifocal pneumonia Active Problems:   Chronic combined systolic (congestive) and diastolic (congestive) heart failure (HCC)   Cocaine abuse ([place])   Homeless   Osteomyelitis of great toe of right foot ([place])   Community acquired pneumonia   CAD (coronary artery disease)   Sepsis ([place])   Chest pain   HTN (hypertension)   Type 2 diabetes mellitus with complications (HCC)   Tobacco abuse   Body mass index is 27.13 kg/m.     Osteomyelitis of right great toe: Continue empiric IV antibiotics.  Analgesics as needed for pain.  Patient has been evaluated by the podiatrist.  However, patient has declined surgical intervention/amputation of distal aspect of the right great toe.  Multifocal  pneumonia: Continue empiric IV antibiotics.  Chronic systolic and diastolic CHF, hypertension, CAD: Compensated.  Continue aspirin and carvedilol 2D echo in August 2021 showed EF of 25 to 30% and grade 3 diastolic dysfunction.  IDDM with hyperglycemia: Hemoglobin A1c was 11.9.  Continue Lantus and NovoLog.  Cocaine and marijuana abuse: Counseled to quit using illicit drugs.  Homelessness: He said he just wants a place to stay.  Follow-up with social worker to assist with disposition.    Diet Order            Diet heart healthy/carb modified Room service appropriate? Yes; Fluid consistency: Thin  Diet effective now                    Consultants:  Podiatrist  Procedures:  None    Medications:   . ascorbic acid  250 mg Oral BID  . aspirin EC  81 mg Oral Daily  . carvedilol  3.125 mg Oral BID WC  . fluticasone  2 spray Each Nare Daily  . gabapentin  100 mg Oral QHS  . heparin  5,000 Units Subcutaneous Q8H  . insulin aspart  0-5 Units Subcutaneous QHS  . insulin aspart  0-9 Units Subcutaneous TID WC  . insulin glargine  30 Units Subcutaneous Daily  . multivitamin with minerals  1 tablet Oral Daily  . nicotine  21 mg Transdermal Daily   Continuous Infusions: . sodium chloride 10 mL/hr at 03/13/21 0308  . ceFEPime (MAXIPIME) IV 2 g (03/13/21 1404)  . vancomycin 1,250 mg (03/13/21 0725)  Anti-infectives (From admission, onward)   Start     Dose/Rate Route Frequency Ordered Stop   03/12/21 0400  vancomycin (VANCOREADY) IVPB 1250 mg/250 mL        1,250 mg 166.7 mL/hr over 90 Minutes Intravenous Every 12 hours 03/11/21 1602     03/11/21 2200  ceFEPIme (MAXIPIME) 2 g in sodium chloride 0.9 % 100 mL IVPB        2 g 200 mL/hr over 30 Minutes Intravenous Every 8 hours 03/11/21 1602     03/11/21 1500  vancomycin (VANCOCIN) IVPB 1000 mg/200 mL premix       "Followed by" Linked Group Details   1,000 mg 200 mL/hr over 60 Minutes Intravenous  Once 03/11/21 1343  03/11/21 1633   03/11/21 1345  vancomycin (VANCOCIN) IVPB 1000 mg/200 mL premix       "Followed by" Linked Group Details   1,000 mg 200 mL/hr over 60 Minutes Intravenous  Once 03/11/21 1343 03/11/21 1513   03/11/21 1345  ceFEPIme (MAXIPIME) 2 g in sodium chloride 0.9 % 100 mL IVPB        2 g 200 mL/hr over 30 Minutes Intravenous  Once 03/11/21 1343 03/11/21 1442             Family Communication/Anticipated D/C date and plan/Code Status   DVT prophylaxis: heparin injection 5,000 Units Start: 03/11/21 2200     Code Status: Full Code  Family Communication: None Disposition Plan:    Status is: Inpatient  Remains inpatient appropriate because:IV treatments appropriate due to intensity of illness or inability to take PO   Dispo:  Patient From: Other  Planned Disposition: Homeless/Shelter  Medically stable for discharge: No             Subjective:   C/o cough and pain in the right big toe.  Objective:    Vitals:   03/13/21 0414 03/13/21 0803 03/13/21 1531 03/13/21 1538  BP: 119/77 117/81 105/68 (!) 126/51  Pulse: 68 84 75 82  Resp: 18 15 18 15   Temp: 98.1 F (36.7 C) 97.7 F (36.5 C) 98.4 F (36.9 C) 97.6 F (36.4 C)  TempSrc:      SpO2: 100% 99% 100% 100%  Weight:      Height:       No data found.   Intake/Output Summary (Last 24 hours) at 03/13/2021 1720 Last data filed at 03/13/2021 1404 Gross per 24 hour  Intake 1316.42 ml  Output 1000 ml  Net 316.42 ml   Filed Weights   03/11/21 1129  Weight: 85.8 kg    Exam:  GEN: NAD SKIN: Warm and dry EYES: EOMI ENT: MMM CV: RRR PULM: Right basilar rales.  No wheezing heard. ABD: soft, ND, NT, +BS CNS: AAO x 3, non focal EXT: Swelling and tenderness of right first toe.  Right first toe wound.        Data Reviewed:   I have personally reviewed following labs and imaging studies:  Labs: Labs show the following:   Basic Metabolic Panel: Recent Labs  Lab 03/11/21 1131  03/12/21 0401 03/13/21 0419  NA 130* 139  --   K 4.3 3.9  --   CL 99 109  --   CO2 22 25  --   GLUCOSE 363* 78  --   BUN 12 10  --   CREATININE 0.93 0.83 0.89  CALCIUM 8.2* 8.2*  --    GFR Estimated Creatinine Clearance: 103.7 mL/min (by C-G formula based on SCr of 0.89  mg/dL). Liver Function Tests: Recent Labs  Lab 03/11/21 1131  AST 17  ALT 12  ALKPHOS 76  BILITOT 0.8  PROT 6.9  ALBUMIN 3.1*   No results for input(s): LIPASE, AMYLASE in the last 168 hours. No results for input(s): AMMONIA in the last 168 hours. Coagulation profile Recent Labs  Lab 03/11/21 1332  INR 1.2    CBC: Recent Labs  Lab 03/11/21 1131 03/12/21 0401  WBC 13.2* 8.3  HGB 11.0* 10.3*  HCT 35.0* 33.2*  MCV 84.1 85.3  PLT 255 245   Cardiac Enzymes: No results for input(s): CKTOTAL, CKMB, CKMBINDEX, TROPONINI in the last 168 hours. BNP (last 3 results) No results for input(s): PROBNP in the last 8760 hours. CBG: Recent Labs  Lab 03/12/21 2041 03/12/21 2319 03/13/21 0806 03/13/21 1235 03/13/21 1717  GLUCAP 253* 285* 249* 130* 290*   D-Dimer: Recent Labs    03/11/21 1332  DDIMER 0.75*   Hgb A1c: Recent Labs    03/11/21 1131  HGBA1C 11.2*   Lipid Profile: No results for input(s): CHOL, HDL, LDLCALC, TRIG, CHOLHDL, LDLDIRECT in the last 72 hours. Thyroid function studies: No results for input(s): TSH, T4TOTAL, T3FREE, THYROIDAB in the last 72 hours.  Invalid input(s): FREET3 Anemia work up: No results for input(s): VITAMINB12, FOLATE, FERRITIN, TIBC, IRON, RETICCTPCT in the last 72 hours. Sepsis Labs: Recent Labs  Lab 03/11/21 1131 03/11/21 1332 03/12/21 0401  PROCALCITON 0.21  --   --   WBC 13.2*  --  8.3  LATICACIDVEN  --  1.3  --     Microbiology Recent Results (from the past 240 hour(s))  Blood culture (routine x 2)     Status: None (Preliminary result)   Collection Time: 03/11/21  1:32 PM   Specimen: BLOOD  Result Value Ref Range Status   Specimen  Description BLOOD BLOOD RIGHT FOREARM  Final   Special Requests   Final    BOTTLES DRAWN AEROBIC AND ANAEROBIC Blood Culture adequate volume   Culture   Final    NO GROWTH 2 DAYS Performed at [facility], [address] [place], [place] [address]    Report Status PENDING  Incomplete  Blood culture (routine x 2)     Status: None (Preliminary result)   Collection Time: 03/11/21  1:32 PM   Specimen: BLOOD  Result Value Ref Range Status   Specimen Description BLOOD BLOOD RIGHT WRIST  Final   Special Requests   Final    BOTTLES DRAWN AEROBIC AND ANAEROBIC Blood Culture results may not be optimal due to an inadequate volume of blood received in culture bottles   Culture   Final    NO GROWTH 2 DAYS Performed at [facility], [address] [place], [place] [address]    Report Status PENDING  Incomplete  Resp Panel by RT-PCR (Flu A&B, Covid) Nasopharyngeal Swab     Status: None   Collection Time: 03/11/21  1:40 PM   Specimen: Nasopharyngeal Swab; Nasopharyngeal(NP) swabs in vial transport medium  Result Value Ref Range Status   SARS Coronavirus 2 by RT PCR NEGATIVE NEGATIVE Final    Comment: (NOTE) SARS-CoV-2 target nucleic acids are NOT DETECTED.  The SARS-CoV-2 RNA is generally detectable in upper respiratory specimens during the acute phase of infection. The lowest concentration of SARS-CoV-2 viral copies this assay can detect is 138 copies/mL. A negative result does not preclude SARS-Cov-2 infection and should not be used as the sole basis for treatment or other patient management  decisions. A negative result may occur with  improper specimen collection/handling, submission of specimen other than nasopharyngeal swab, presence of viral mutation(s) within the areas targeted by this assay, and inadequate number of viral copies(<138 copies/mL). A negative result must be combined with clinical observations, patient history, and epidemiological information. The  expected result is Negative.  Fact Sheet for Patients:  EntrepreneurPulse.com.au  Fact Sheet for Healthcare Providers:  IncredibleEmployment.be  This test is no t yet approved or cleared by the [place] FDA and  has been authorized for detection and/or diagnosis of SARS-CoV-2 by FDA under an Emergency Use Authorization (EUA). This EUA will remain  in effect (meaning this test can be used) for the duration of the COVID-19 declaration under Section 564(b)(1) of the Act, 21 U.S.C.section 360bbb-3(b)(1), unless the authorization is terminated  or revoked sooner.       Influenza A by PCR NEGATIVE NEGATIVE Final   Influenza B by PCR NEGATIVE NEGATIVE Final    Comment: (NOTE) The Xpert Xpress SARS-CoV-2/FLU/RSV plus assay is intended as an aid in the diagnosis of influenza from Nasopharyngeal swab specimens and should not be used as a sole basis for treatment. Nasal washings and aspirates are unacceptable for Xpert Xpress SARS-CoV-2/FLU/RSV testing.  Fact Sheet for Patients: EntrepreneurPulse.com.au  Fact Sheet for Healthcare Providers: IncredibleEmployment.be  This test is not yet approved or cleared by the [place] FDA and has been authorized for detection and/or diagnosis of SARS-CoV-2 by FDA under an Emergency Use Authorization (EUA). This EUA will remain in effect (meaning this test can be used) for the duration of the COVID-19 declaration under Section 564(b)(1) of the Act, 21 U.S.C. section 360bbb-3(b)(1), unless the authorization is terminated or revoked.  Performed at [facility], [address] [place], [place] [address]   Culture, sputum-assessment     Status: None   Collection Time: 03/11/21 10:12 PM   Specimen: Sputum  Result Value Ref Range Status   Specimen Description SPUTUM  Final   Special Requests NONE  Final   Sputum evaluation   Final    THIS SPECIMEN IS  ACCEPTABLE FOR SPUTUM CULTURE Performed at [facility], [address], [place], [place] [address]    Report Status 03/11/2021 FINAL  Final  Culture, Respiratory w Gram Stain     Status: None (Preliminary result)   Collection Time: 03/11/21 10:12 PM   Specimen: SPU  Result Value Ref Range Status   Specimen Description   Final    SPUTUM Performed at [facility], [address] [place], [place] [address]    Special Requests   Final    NONE Reflexed from [id number] Performed at [facility], [address] [place], [place] [address]    Gram Stain   Final    RARE WBC PRESENT,BOTH PMN AND MONONUCLEAR NO ORGANISMS SEEN    Culture   Final    RARE Normal respiratory flora-no Staph aureus or Pseudomonas seen Performed at [facility], [place] [address], [place], [place] [address]    Report Status PENDING  Incomplete    Procedures and diagnostic studies:  US Venous Img Lower Bilateral (DVT)  Result Date: 03/11/2021 CLINICAL DATA:  Elevated D-dimer and leg pain EXAM: BILATERAL LOWER EXTREMITY VENOUS DOPPLER ULTRASOUND TECHNIQUE: Gray-scale sonography with graded compression, as well as color Doppler and duplex ultrasound were performed to evaluate the lower extremity deep venous systems from the level of the common femoral vein and including the common femoral, femoral, profunda femoral,  popliteal and calf veins including the posterior tibial, peroneal and gastrocnemius veins when visible. The superficial great saphenous vein was also interrogated. Spectral Doppler was utilized to evaluate flow at rest and with distal augmentation maneuvers in the common femoral, femoral and popliteal veins. COMPARISON:  None. FINDINGS: RIGHT LOWER EXTREMITY Common Femoral Vein: No evidence of thrombus. Normal compressibility, respiratory phasicity and response to augmentation. Saphenofemoral Junction: No evidence of thrombus. Normal compressibility and flow on color Doppler  imaging. Profunda Femoral Vein: No evidence of thrombus. Normal compressibility and flow on color Doppler imaging. Femoral Vein: No evidence of thrombus. Normal compressibility, respiratory phasicity and response to augmentation. Popliteal Vein: No evidence of thrombus. Normal compressibility, respiratory phasicity and response to augmentation. Calf Veins: No evidence of thrombus. Normal compressibility and flow on color Doppler imaging. Superficial Great Saphenous Vein: No evidence of thrombus. Normal compressibility. Venous Reflux:  None. Other Findings:  None. LEFT LOWER EXTREMITY Common Femoral Vein: No evidence of thrombus. Normal compressibility, respiratory phasicity and response to augmentation. Saphenofemoral Junction: No evidence of thrombus. Normal compressibility and flow on color Doppler imaging. Profunda Femoral Vein: No evidence of thrombus. Normal compressibility and flow on color Doppler imaging. Femoral Vein: No evidence of thrombus. Normal compressibility, respiratory phasicity and response to augmentation. Popliteal Vein: No evidence of thrombus. Normal compressibility, respiratory phasicity and response to augmentation. Calf Veins: No evidence of thrombus. Normal compressibility and flow on color Doppler imaging. Superficial Great Saphenous Vein: No evidence of thrombus. Normal compressibility. Venous Reflux:  None. Other Findings:  None. IMPRESSION: No evidence of deep venous thrombosis in either lower extremity. Electronically Signed   By: Inez Catalina M.D.   On: 03/11/2021 19:53               LOS: 2 days   Alistar Mcenery  [facility]   Pager on [email or web address]. If 7PM-7AM, please contact night-coverage at [email or web address]     03/13/2021, 5:20 PM

## 2021-03-14 LAB — CULTURE, RESPIRATORY W GRAM STAIN: Culture: NORMAL

## 2021-03-14 LAB — GLUCOSE, CAPILLARY
Glucose-Capillary: 89 mg/dL (ref 70–99)
Glucose-Capillary: 124 mg/dL — ABNORMAL HIGH (ref 70–99)

## 2021-03-14 MED ORDER — AMOXICILLIN-POT CLAVULANATE 875-125 MG PO TABS: 1.0000 | ORAL_TABLET | Freq: Two times a day (BID) | ORAL | 0 refills | Status: DC

## 2021-03-14 MED ORDER — INSULIN GLARGINE 100 UNIT/ML ~~LOC~~ SOLN
40.0000 [IU] | Freq: Every day | SUBCUTANEOUS | Status: DC
  Administered 2021-03-14: 40 [IU] via SUBCUTANEOUS
  Filled 2021-03-14 (×2): qty 0.4

## 2021-03-14 NOTE — Discharge Summary (Addendum)
Physician Discharge Summary  Joshua [name] KGM:[id number] DOB: 1971/06/18 DOA: 03/11/2021  PCP: Patient, No Pcp Per (Inactive)  Admit date: 03/11/2021 Discharge date: 03/14/2021  Discharge disposition: Home   Recommendations for Outpatient Follow-Up:   Follow-up with podiatrist in 1 week Follow-up with PCP in 1 week   Discharge Diagnosis:   Principal Problem:   Multifocal pneumonia Active Problems:   Chronic combined systolic (congestive) and diastolic (congestive) heart failure (HCC)   Cocaine abuse ([place])   Homeless   Osteomyelitis of great toe of right foot ([place])   Community acquired pneumonia   CAD (coronary artery disease)   Sepsis ([place])   Chest pain   HTN (hypertension)   Type 2 diabetes mellitus with complications ([place])   Tobacco abuse    Discharge Condition: Stable.  Diet recommendation:  Diet Order            Diet - low sodium heart healthy           Diet Carb Modified           Diet heart healthy/carb modified Room service appropriate? Yes; Fluid consistency: Thin  Diet effective now                   Code Status: Full Code     Hospital Course:   Joshua [name] is a 50 y.o. male with medical history of hypertension, insulin-dependent diabetes mellitus, chronic systolic and diastolic CHF with EF of 25 to 30%, CAD, cocaine abuse, chronic anemia, tobacco use disorder, recent admission for right great toe osteomyelitis (hospitalized from 02/12/2021 to 02/19/2021), homelessness.  He presented to the hospital with chest pain, generalized body aches and right foot pain.  He was found to have sepsis secondary to multifocal pneumonia and osteomyelitis of the right great toe.  He was treated with empiric IV antibiotics. Podiatrist was consulted for evaluation of osteomyelitis of the right great toe.  It is worth noting that on his recent admission, amputation of the distal third of the right great toe was recommended but patient refused to  have the procedure done. Podiatrist reiterated the need for amputation of the right great toe to treat his osteomyelitis but patient still declined to have the procedure done.  Podiatrist recommended outpatient follow-up.  Respiratory status is stable.  He is tolerating room air.  He is deemed stable for discharge to home.  Case was discussed with Dr. Vickki Muff, podiatrist, via secure chat today.  He recommended a 2-week course of Augmentin.  Patient is homeless but he said he was not interested in going to a shelter.  He called a friend to come and pick him up from the hospital.     Medical Consultants:    Podiatrist   Discharge Exam:    Vitals:   03/13/21 1538 03/13/21 1936 03/13/21 2346 03/14/21 0812  BP: (!) 126/51 117/79 115/88 105/82  Pulse: 82 83 84 82  Resp: 15 20 20 17   Temp: 97.6 F (36.4 C) 98.5 F (36.9 C) 98.5 F (36.9 C) 98.1 F (36.7 C)  TempSrc:  Oral Oral Oral  SpO2: 100% 97% 100% 100%  Weight:      Height:         GEN: NAD SKIN: No rash EYES: EOMI ENT: MMM CV: RRR PULM: CTA B ABD: soft, ND, NT, +BS CNS: AAO x 3, non focal EXT: Mild tenderness on the right first toe.   The results of significant diagnostics from this hospitalization (including imaging, microbiology,  ancillary and laboratory) are listed below for reference.     Procedures and Diagnostic Studies:   DG Chest 2 View  Result Date: 03/11/2021 CLINICAL DATA:  Chest pain EXAM: CHEST - 2 VIEW COMPARISON:  10/03/2020 FINDINGS: The heart size and mediastinal contours are within normal limits. Both lungs are clear. The visualized skeletal structures are unremarkable. IMPRESSION: No acute abnormality of the lungs. Electronically Signed   By: Eddie Candle M.D.   On: 03/11/2021 12:27   CT Angio Chest PE W and/or Wo Contrast  Result Date: 03/11/2021 CLINICAL DATA:  Chest pain EXAM: CT ANGIOGRAPHY CHEST WITH CONTRAST TECHNIQUE: Multidetector CT imaging of the chest was performed using the standard  protocol during bolus administration of intravenous contrast. Multiplanar CT image reconstructions and MIPs were obtained to evaluate the vascular anatomy. CONTRAST:  60mL OMNIPAQUE IOHEXOL 350 MG/ML SOLN COMPARISON:  Chest radiograph March 11, 2021 FINDINGS: Cardiovascular: There is no demonstrable pulmonary embolus. There is no thoracic aortic aneurysm or dissection. Visualized great vessels appear normal. Right innominate and left common carotid arteries arise as a common trunk, there is calcification in multiple coronary arteries. There is no pericardial effusion or pericardial thickening. Mediastinum/Nodes: Thyroid appears normal. No evident thoracic adenopathy. Small hiatal hernia noted. Lungs/Pleura: There is ill-defined airspace opacity in portions of each lower lobe, with airspace opacity in portions of all segments of the right lower lobe and in portions of the superior segment, posterior segment, and lateral segment left lower lobe. Upper lobes and right middle lobe are clear. No pleural effusions are evident. No pneumothorax. Trachea and major bronchial structures are patent. Upper Abdomen: Stomach mildly distended with fluid and air. Visualized upper abdominal structures otherwise appear unremarkable. Musculoskeletal: No blastic or lytic bone lesions. No evident chest wall lesions. Review of the MIP images confirms the above findings. IMPRESSION: 1. no demonstrable pulmonary embolus. No thoracic aortic aneurysm or dissection. There are multiple foci of coronary artery calcification. 2. Areas of pneumonia in each lower lobe, slightly more severe on the right than the left. No pleural effusions. 3.  Small hiatal hernia. 4.  No evident adenopathy. Electronically Signed   By: Lowella Grip III M.D.   On: 03/11/2021 15:15   US Venous Img Lower Bilateral (DVT)  Result Date: 03/11/2021 CLINICAL DATA:  Elevated D-dimer and leg pain EXAM: BILATERAL LOWER EXTREMITY VENOUS DOPPLER ULTRASOUND TECHNIQUE:  Gray-scale sonography with graded compression, as well as color Doppler and duplex ultrasound were performed to evaluate the lower extremity deep venous systems from the level of the common femoral vein and including the common femoral, femoral, profunda femoral, popliteal and calf veins including the posterior tibial, peroneal and gastrocnemius veins when visible. The superficial great saphenous vein was also interrogated. Spectral Doppler was utilized to evaluate flow at rest and with distal augmentation maneuvers in the common femoral, femoral and popliteal veins. COMPARISON:  None. FINDINGS: RIGHT LOWER EXTREMITY Common Femoral Vein: No evidence of thrombus. Normal compressibility, respiratory phasicity and response to augmentation. Saphenofemoral Junction: No evidence of thrombus. Normal compressibility and flow on color Doppler imaging. Profunda Femoral Vein: No evidence of thrombus. Normal compressibility and flow on color Doppler imaging. Femoral Vein: No evidence of thrombus. Normal compressibility, respiratory phasicity and response to augmentation. Popliteal Vein: No evidence of thrombus. Normal compressibility, respiratory phasicity and response to augmentation. Calf Veins: No evidence of thrombus. Normal compressibility and flow on color Doppler imaging. Superficial Great Saphenous Vein: No evidence of thrombus. Normal compressibility. Venous Reflux:  None. Other Findings:  None. LEFT LOWER EXTREMITY Common Femoral Vein: No evidence of thrombus. Normal compressibility, respiratory phasicity and response to augmentation. Saphenofemoral Junction: No evidence of thrombus. Normal compressibility and flow on color Doppler imaging. Profunda Femoral Vein: No evidence of thrombus. Normal compressibility and flow on color Doppler imaging. Femoral Vein: No evidence of thrombus. Normal compressibility, respiratory phasicity and response to augmentation. Popliteal Vein: No evidence of thrombus. Normal  compressibility, respiratory phasicity and response to augmentation. Calf Veins: No evidence of thrombus. Normal compressibility and flow on color Doppler imaging. Superficial Great Saphenous Vein: No evidence of thrombus. Normal compressibility. Venous Reflux:  None. Other Findings:  None. IMPRESSION: No evidence of deep venous thrombosis in either lower extremity. Electronically Signed   By: Inez Catalina M.D.   On: 03/11/2021 19:53   DG Foot Complete Right  Result Date: 03/11/2021 CLINICAL DATA:  Foot pain EXAM: RIGHT FOOT COMPLETE - 3+ VIEW COMPARISON:  None. FINDINGS: No acute fracture or dislocation. Mild irregularity along the distal medial tuft of the first distal phalanx. Soft tissues are unremarkable. IMPRESSION: No acute osseous injury of the right foot. Mild irregularity along the distal medial tuft of the first distal phalanx concerning for osteomyelitis. Electronically Signed   By: Kathreen Devoid   On: 03/11/2021 13:54     Labs:   Basic Metabolic Panel: Recent Labs  Lab 03/11/21 1131 03/12/21 0401 03/13/21 0419  NA 130* 139  --   K 4.3 3.9  --   CL 99 109  --   CO2 22 25  --   GLUCOSE 363* 78  --   BUN 12 10  --   CREATININE 0.93 0.83 0.89  CALCIUM 8.2* 8.2*  --    GFR Estimated Creatinine Clearance: 103.7 mL/min (by C-G formula based on SCr of 0.89 mg/dL). Liver Function Tests: Recent Labs  Lab 03/11/21 1131  AST 17  ALT 12  ALKPHOS 76  BILITOT 0.8  PROT 6.9  ALBUMIN 3.1*   No results for input(s): LIPASE, AMYLASE in the last 168 hours. No results for input(s): AMMONIA in the last 168 hours. Coagulation profile Recent Labs  Lab 03/11/21 1332  INR 1.2    CBC: Recent Labs  Lab 03/11/21 1131 03/12/21 0401  WBC 13.2* 8.3  HGB 11.0* 10.3*  HCT 35.0* 33.2*  MCV 84.1 85.3  PLT 255 245   Cardiac Enzymes: No results for input(s): CKTOTAL, CKMB, CKMBINDEX, TROPONINI in the last 168 hours. BNP: Invalid input(s): POCBNP CBG: Recent Labs  Lab  03/13/21 1235 03/13/21 1717 03/13/21 2109 03/14/21 0811 03/14/21 1125  GLUCAP 130* 290* 308* 89 124*   D-Dimer Recent Labs    03/11/21 1332  DDIMER 0.75*   Hgb A1c No results for input(s): HGBA1C in the last 72 hours. Lipid Profile No results for input(s): CHOL, HDL, LDLCALC, TRIG, CHOLHDL, LDLDIRECT in the last 72 hours. Thyroid function studies No results for input(s): TSH, T4TOTAL, T3FREE, THYROIDAB in the last 72 hours.  Invalid input(s): FREET3 Anemia work up No results for input(s): VITAMINB12, FOLATE, FERRITIN, TIBC, IRON, RETICCTPCT in the last 72 hours. Microbiology Recent Results (from the past 240 hour(s))  Blood culture (routine x 2)     Status: None (Preliminary result)   Collection Time: 03/11/21  1:32 PM   Specimen: BLOOD  Result Value Ref Range Status   Specimen Description BLOOD BLOOD RIGHT FOREARM  Final   Special Requests   Final    BOTTLES DRAWN AEROBIC AND ANAEROBIC Blood Culture adequate volume   Culture  Final    NO GROWTH 3 DAYS Performed at [facility], [address] [place], [place] [address]    Report Status PENDING  Incomplete  Blood culture (routine x 2)     Status: None (Preliminary result)   Collection Time: 03/11/21  1:32 PM   Specimen: BLOOD  Result Value Ref Range Status   Specimen Description BLOOD BLOOD RIGHT WRIST  Final   Special Requests   Final    BOTTLES DRAWN AEROBIC AND ANAEROBIC Blood Culture results may not be optimal due to an inadequate volume of blood received in culture bottles   Culture   Final    NO GROWTH 3 DAYS Performed at [facility], [address], [place], [place] [address]    Report Status PENDING  Incomplete  Resp Panel by RT-PCR (Flu A&B, Covid) Nasopharyngeal Swab     Status: None   Collection Time: 03/11/21  1:40 PM   Specimen: Nasopharyngeal Swab; Nasopharyngeal(NP) swabs in vial transport medium  Result Value Ref Range Status   SARS Coronavirus 2 by RT PCR NEGATIVE NEGATIVE  Final    Comment: (NOTE) SARS-CoV-2 target nucleic acids are NOT DETECTED.  The SARS-CoV-2 RNA is generally detectable in upper respiratory specimens during the acute phase of infection. The lowest concentration of SARS-CoV-2 viral copies this assay can detect is 138 copies/mL. A negative result does not preclude SARS-Cov-2 infection and should not be used as the sole basis for treatment or other patient management decisions. A negative result may occur with  improper specimen collection/handling, submission of specimen other than nasopharyngeal swab, presence of viral mutation(s) within the areas targeted by this assay, and inadequate number of viral copies(<138 copies/mL). A negative result must be combined with clinical observations, patient history, and epidemiological information. The expected result is Negative.  Fact Sheet for Patients:  EntrepreneurPulse.com.au  Fact Sheet for Healthcare Providers:  IncredibleEmployment.be  This test is no t yet approved or cleared by the [place] FDA and  has been authorized for detection and/or diagnosis of SARS-CoV-2 by FDA under an Emergency Use Authorization (EUA). This EUA will remain  in effect (meaning this test can be used) for the duration of the COVID-19 declaration under Section 564(b)(1) of the Act, 21 U.S.C.section 360bbb-3(b)(1), unless the authorization is terminated  or revoked sooner.       Influenza A by PCR NEGATIVE NEGATIVE Final   Influenza B by PCR NEGATIVE NEGATIVE Final    Comment: (NOTE) The Xpert Xpress SARS-CoV-2/FLU/RSV plus assay is intended as an aid in the diagnosis of influenza from Nasopharyngeal swab specimens and should not be used as a sole basis for treatment. Nasal washings and aspirates are unacceptable for Xpert Xpress SARS-CoV-2/FLU/RSV testing.  Fact Sheet for Patients: EntrepreneurPulse.com.au  Fact Sheet for Healthcare  Providers: IncredibleEmployment.be  This test is not yet approved or cleared by the [place] FDA and has been authorized for detection and/or diagnosis of SARS-CoV-2 by FDA under an Emergency Use Authorization (EUA). This EUA will remain in effect (meaning this test can be used) for the duration of the COVID-19 declaration under Section 564(b)(1) of the Act, 21 U.S.C. section 360bbb-3(b)(1), unless the authorization is terminated or revoked.  Performed at [facility], [address] [place], [place] [address]   Culture, sputum-assessment     Status: None   Collection Time: 03/11/21 10:12 PM   Specimen: Sputum  Result Value Ref Range Status   Specimen Description SPUTUM  Final   Special Requests NONE  Final   Sputum evaluation   Final    THIS SPECIMEN IS ACCEPTABLE FOR SPUTUM CULTURE Performed at [facility], [address] [place], [place] [address]    Report Status 03/11/2021 FINAL  Final  Culture, Respiratory w Gram Stain     Status: None   Collection Time: 03/11/21 10:12 PM   Specimen: SPU  Result Value Ref Range Status   Specimen Description   Final    SPUTUM Performed at [facility], [address], [place], [place] [address]    Special Requests   Final    NONE Reflexed from [id number] Performed at [facility], [place], [place] [address]    Gram Stain   Final    RARE WBC PRESENT,BOTH PMN AND MONONUCLEAR NO ORGANISMS SEEN    Culture   Final    RARE Normal respiratory flora-no Staph aureus or Pseudomonas seen Performed at [facility], [place] [address] [place], [place] [address]    Report Status 03/14/2021 FINAL  Final     Discharge Instructions:   Discharge Instructions    Diet - low sodium heart healthy   Complete by: As directed    Diet Carb Modified   Complete by: As directed    Discharge wound care:   Complete by: As directed    Apply bandage to right big toe to keep  it clean and dry   Increase activity slowly   Complete by: As directed      Allergies as of 03/14/2021   No Known Allergies     Medication List    TAKE these medications   amoxicillin-clavulanate 875-125 MG tablet Commonly known as: Augmentin Take 1 tablet by mouth 2 (two) times daily for 14 days.   ascorbic acid 250 MG tablet Commonly known as: VITAMIN C Take 1 tablet (250 mg total) by mouth 2 (two) times daily.   carvedilol 3.125 MG tablet Commonly known as: COREG Take 1 tablet (3.125 mg total) by mouth 2 (two) times daily with a meal.   dapagliflozin propanediol 5 MG Tabs tablet Commonly known as: FARXIGA Take 1 tablet (5 mg total) by mouth daily.   fluticasone 50 MCG/ACT nasal spray Commonly known as: FLONASE Place 2 sprays into both nostrils daily.   furosemide 20 MG tablet Commonly known as: Lasix Take 1 tablet (20 mg total) by mouth daily as needed (for weight gain >3lbs in 1 days and >5lbs in 2 days).   gabapentin 100 MG capsule Commonly known as: NEURONTIN Take 1 capsule (100 mg total) by mouth at bedtime.   insulin glargine 100 UNIT/ML Solostar Pen Commonly known as: LANTUS Inject 40 Units into the skin daily.   losartan 25 MG tablet Commonly known as: Cozaar Take 1 tablet (25 mg total) by mouth daily.   metFORMIN 500 MG tablet Commonly known as: Glucophage Take 1 tablet (500 mg total) by mouth daily with breakfast.   multivitamin with minerals Tabs tablet Take 1 tablet by mouth daily.   Pen Needles 31G X 5 MM Misc 40 Units by Does not apply route at bedtime.   spironolactone 25 MG tablet Commonly known as: ALDACTONE Take 1 tablet (25 mg total) by mouth daily.            Discharge Care Instructions  (From admission, onward)         Start     Ordered   03/14/21 0000  Discharge wound care:       Comments:  Apply bandage to right big toe to keep it clean and dry   03/14/21 1213            Time coordinating discharge: 28  minutes  Signed:  Blaze Sandin  [facility] 03/14/2021, 12:14 PM   Pager on [email or web address]. If 7PM-7AM, please contact night-coverage at [email or web address]

## 2021-03-14 NOTE — TOC Progression Note (Signed)
Transition of Care [facility]) - Progression Note    Patient Details  Name: Joshua [name] MRN: [id number] Date of Birth: [birth year]  Transition of [facility]) CM/SW [place], RN Phone Number: 03/14/2021, 1:30 PM  Clinical Narrative:   The patient has an antibiotic prescription that was sent to [facility] to pick up, the patient stated that he can't afford the copay, The CM provided the patient with $3 in cash to cover the copay for the Antibiotics.  [place] law states that the Pharmacy filling the RX can waive the copay of the patient can't pay it    Expected Discharge Plan: [place] (He may stay with friend or family, he has not decided) Barriers to Discharge: Financial Resources,Homeless with medical needs,Continued Medical Work up  Expected Discharge Plan and Services Expected Discharge Plan: Homeless Shelter (He may stay with friend or family, he has not decided)   Discharge Planning Services: CM Consult,Medication Assistance,Other - See comment (financial, houseing resources) Post Acute Care Choice: NA Living arrangements for the past 2 months: Homeless Expected Discharge Date: 03/14/21               DME Arranged: N/A                     Social Determinants of Health (SDOH) Interventions    Readmission Risk Interventions Readmission Risk Prevention Plan 09/10/2018  Post Dischage Appt Complete  Medication Screening Complete  Transportation Screening Complete  PCP follow-up Complete  Some recent data might be hidden

## 2021-03-14 NOTE — Progress Notes (Signed)
Pharmacy Antibiotic Note  Joshua Joshua [name] is Joshua [name] 50 y.o. male admitted on 03/11/2021. Concern for pneumonia, as well as diabetic foot infection/osteomyelitis. Pharmacy has been consulted for vancomycin and cefepime dosing.  Plan: Day 4- vancomycin 1250 mg IV q12h  Goal AUC 400-550 Expected AUC: 466 SCr used: 0.93  Day 4- Cefepime 2 g IV q8h   4/1: Per Podiatry note: plan to discharge on oral antibiotics if patient does not have amputation    Height: 5\' 10"  (177.8 cm) Weight: 85.8 kg (189 lb 1.6 oz) IBW/kg (Calculated) : 73  Temp (24hrs), Avg:98.1 F (36.7 C), Min:97.6 F (36.4 C), Max:98.5 F (36.9 C)  Recent Labs  Lab 03/11/21 1131 03/11/21 1332 03/12/21 0401 03/13/21 0419  WBC 13.2*  --  8.3  --   CREATININE 0.93  --  0.83 0.89  LATICACIDVEN  --  1.3  --   --     Estimated Creatinine Clearance: 103.7 mL/min (by C-G formula based on SCr of 0.89 mg/dL).    No Known Allergies  Antimicrobials this admission: Vancomycin 3/29 >> Cefepime 3/29 >>  Microbiology results: 3/29 BCx: NG 3/29 sputum cx NG  Thank you for allowing pharmacy to be Joshua [name] part of this patient's care.  Joshua Joshua [name], PharmD 03/14/2021 7:50 AM

## 2021-03-16 LAB — CULTURE, BLOOD (ROUTINE X 2)
Culture: NO GROWTH
Culture: NO GROWTH
Special Requests: ADEQUATE

## 2021-03-19 DIAGNOSIS — E118 Type 2 diabetes mellitus with unspecified complications: Secondary | ICD-10-CM

## 2021-03-19 LAB — GLUCOSE, POCT (MANUAL RESULT ENTRY): POC Glucose: 255 mg/dl — AB (ref 70–99)

## 2021-03-19 NOTE — [facility]ational Nurse Program (Signed)
  Dept: [facility] Nurse Program Note  Date of Encounter: 03/18/2021 Client in to clinic for blood sugar check. He was recently hospitalized for pneumonia. He did report that he was taking his discharge medications, unclear about antidiabetic medications. Emotional support provided. Client remains homeless, will continue to return to clinic for support. Past Medical History: Past Medical History:  Diagnosis Date  . CHF (congestive heart failure) ([place])   . Diabetes mellitus without complication ([place])   . Hypertension     Encounter Details:  CNP Questionnaire - 03/18/21 0836      Questionnaire   Do you give verbal consent to treat you today? Yes    Visit Setting Church or Organization    Location Patient Served At [facility]    Patient Status Homeless    Medical Provider No    Insurance Medicaid    Intervention Assess (including screenings);Educate;Refer    Housing/Utilities No permanent housing    Transportation Need transportation assistance    Interpersonal Safety Do not feel physically and emotionally safe where you currently live    Food Have food insecurities    Medication Have medication insecurities    Referrals PCP - other provider

## 2021-03-24 ENCOUNTER — Inpatient Hospital Stay
Admission: EM | Admit: 2021-03-24 | Discharge: 2021-04-05 | DRG: 503 | Disposition: A | Discharge status: Home / Self Care | Payer: Medicaid Other | Attending: Internal Medicine | Admitting: Internal Medicine

## 2021-03-24 ENCOUNTER — Emergency Department: Payer: Medicaid Other

## 2021-03-24 ENCOUNTER — Other Ambulatory Visit: Payer: Self-pay

## 2021-03-24 DIAGNOSIS — E1165 Type 2 diabetes mellitus with hyperglycemia: Secondary | ICD-10-CM | POA: Diagnosis present

## 2021-03-24 DIAGNOSIS — D649 Anemia, unspecified: Secondary | ICD-10-CM | POA: Diagnosis present

## 2021-03-24 DIAGNOSIS — I252 Old myocardial infarction: Secondary | ICD-10-CM

## 2021-03-24 DIAGNOSIS — F1410 Cocaine abuse, uncomplicated: Secondary | ICD-10-CM | POA: Diagnosis present

## 2021-03-24 DIAGNOSIS — J189 Pneumonia, unspecified organism: Secondary | ICD-10-CM | POA: Diagnosis present

## 2021-03-24 DIAGNOSIS — M86171 Other acute osteomyelitis, right ankle and foot: Secondary | ICD-10-CM | POA: Diagnosis present

## 2021-03-24 DIAGNOSIS — F1210 Cannabis abuse, uncomplicated: Secondary | ICD-10-CM | POA: Diagnosis present

## 2021-03-24 DIAGNOSIS — A419 Sepsis, unspecified organism: Secondary | ICD-10-CM

## 2021-03-24 DIAGNOSIS — L97509 Non-pressure chronic ulcer of other part of unspecified foot with unspecified severity: Secondary | ICD-10-CM | POA: Diagnosis not present

## 2021-03-24 DIAGNOSIS — B9561 Methicillin susceptible Staphylococcus aureus infection as the cause of diseases classified elsewhere: Secondary | ICD-10-CM | POA: Diagnosis present

## 2021-03-24 DIAGNOSIS — E1142 Type 2 diabetes mellitus with diabetic polyneuropathy: Secondary | ICD-10-CM | POA: Diagnosis present

## 2021-03-24 DIAGNOSIS — M86671 Other chronic osteomyelitis, right ankle and foot: Secondary | ICD-10-CM | POA: Diagnosis present

## 2021-03-24 DIAGNOSIS — I5042 Chronic combined systolic (congestive) and diastolic (congestive) heart failure: Secondary | ICD-10-CM | POA: Diagnosis present

## 2021-03-24 DIAGNOSIS — F17200 Nicotine dependence, unspecified, uncomplicated: Secondary | ICD-10-CM | POA: Diagnosis present

## 2021-03-24 DIAGNOSIS — L02415 Cutaneous abscess of right lower limb: Secondary | ICD-10-CM | POA: Diagnosis not present

## 2021-03-24 DIAGNOSIS — I10 Essential (primary) hypertension: Secondary | ICD-10-CM | POA: Diagnosis not present

## 2021-03-24 DIAGNOSIS — I5022 Chronic systolic (congestive) heart failure: Secondary | ICD-10-CM | POA: Diagnosis present

## 2021-03-24 DIAGNOSIS — Z794 Long term (current) use of insulin: Secondary | ICD-10-CM

## 2021-03-24 DIAGNOSIS — Z8249 Family history of ischemic heart disease and other diseases of the circulatory system: Secondary | ICD-10-CM | POA: Diagnosis not present

## 2021-03-24 DIAGNOSIS — L03115 Cellulitis of right lower limb: Secondary | ICD-10-CM | POA: Diagnosis present

## 2021-03-24 DIAGNOSIS — E1169 Type 2 diabetes mellitus with other specified complication: Secondary | ICD-10-CM | POA: Diagnosis present

## 2021-03-24 DIAGNOSIS — Z955 Presence of coronary angioplasty implant and graft: Secondary | ICD-10-CM | POA: Diagnosis not present

## 2021-03-24 DIAGNOSIS — Z5901 Sheltered homelessness: Secondary | ICD-10-CM | POA: Diagnosis not present

## 2021-03-24 DIAGNOSIS — I2510 Atherosclerotic heart disease of native coronary artery without angina pectoris: Secondary | ICD-10-CM | POA: Diagnosis present

## 2021-03-24 DIAGNOSIS — Z20822 Contact with and (suspected) exposure to covid-19: Secondary | ICD-10-CM | POA: Diagnosis present

## 2021-03-24 DIAGNOSIS — E118 Type 2 diabetes mellitus with unspecified complications: Secondary | ICD-10-CM

## 2021-03-24 DIAGNOSIS — Z880 Allergy status to penicillin: Secondary | ICD-10-CM | POA: Diagnosis not present

## 2021-03-24 DIAGNOSIS — I110 Hypertensive heart disease with heart failure: Secondary | ICD-10-CM | POA: Diagnosis present

## 2021-03-24 DIAGNOSIS — E871 Hypo-osmolality and hyponatremia: Secondary | ICD-10-CM | POA: Diagnosis present

## 2021-03-24 DIAGNOSIS — E11621 Type 2 diabetes mellitus with foot ulcer: Secondary | ICD-10-CM | POA: Diagnosis not present

## 2021-03-24 DIAGNOSIS — Z9119 Patient's noncompliance with other medical treatment and regimen: Secondary | ICD-10-CM

## 2021-03-24 DIAGNOSIS — Z79899 Other long term (current) drug therapy: Secondary | ICD-10-CM

## 2021-03-24 DIAGNOSIS — Z7984 Long term (current) use of oral hypoglycemic drugs: Secondary | ICD-10-CM

## 2021-03-24 LAB — CBC WITH DIFFERENTIAL/PLATELET
Abs Immature Granulocytes: 0.07 10*3/uL (ref 0.00–0.07)
Basophils Absolute: 0.1 10*3/uL (ref 0.0–0.1)
Basophils Relative: 0 %
Eosinophils Absolute: 0.1 10*3/uL (ref 0.0–0.5)
Eosinophils Relative: 0 %
HCT: 33.1 % — ABNORMAL LOW (ref 39.0–52.0)
Hemoglobin: 10.4 g/dL — ABNORMAL LOW (ref 13.0–17.0)
Immature Granulocytes: 0 %
Lymphocytes Relative: 11 %
Lymphs Abs: 2 10*3/uL (ref 0.7–4.0)
MCH: 26.3 pg (ref 26.0–34.0)
MCHC: 31.4 g/dL (ref 30.0–36.0)
MCV: 83.6 fL (ref 80.0–100.0)
Monocytes Absolute: 1.6 10*3/uL — ABNORMAL HIGH (ref 0.1–1.0)
Monocytes Relative: 9 %
Neutro Abs: 15.2 10*3/uL — ABNORMAL HIGH (ref 1.7–7.7)
Neutrophils Relative %: 80 %
Platelets: 424 10*3/uL — ABNORMAL HIGH (ref 150–400)
RBC: 3.96 MIL/uL — ABNORMAL LOW (ref 4.22–5.81)
RDW: 14.7 % (ref 11.5–15.5)
WBC: 18.9 10*3/uL — ABNORMAL HIGH (ref 4.0–10.5)
nRBC: 0 % (ref 0.0–0.2)

## 2021-03-24 LAB — COMPREHENSIVE METABOLIC PANEL
ALT: 10 U/L (ref 0–44)
AST: 15 U/L (ref 15–41)
Albumin: 3.3 g/dL — ABNORMAL LOW (ref 3.5–5.0)
Alkaline Phosphatase: 63 U/L (ref 38–126)
Anion gap: 7 (ref 5–15)
BUN: 8 mg/dL (ref 6–20)
CO2: 26 mmol/L (ref 22–32)
Calcium: 8.4 mg/dL — ABNORMAL LOW (ref 8.9–10.3)
Chloride: 100 mmol/L (ref 98–111)
Creatinine, Ser: 0.91 mg/dL (ref 0.61–1.24)
GFR, Estimated: >60 mL/min (ref >60)
Glucose, Bld: 190 mg/dL — ABNORMAL HIGH (ref 70–99)
Potassium: 4.2 mmol/L (ref 3.5–5.1)
Sodium: 133 mmol/L — ABNORMAL LOW (ref 135–145)
Total Bilirubin: 0.8 mg/dL (ref 0.3–1.2)
Total Protein: 7.2 g/dL (ref 6.5–8.1)

## 2021-03-24 LAB — LACTIC ACID, PLASMA: Lactic Acid, Venous: 1.1 mmol/L (ref 0.5–1.9)

## 2021-03-24 IMAGING — DX DG FOOT COMPLETE 3+V*R*
3 series · 3 of 3 positions shown · non-contrast
Comparison: [DATE]

CLINICAL DATA: Great toe osteomyelitis

EXAM:
RIGHT FOOT COMPLETE - 3+ VIEW

[foot ap]
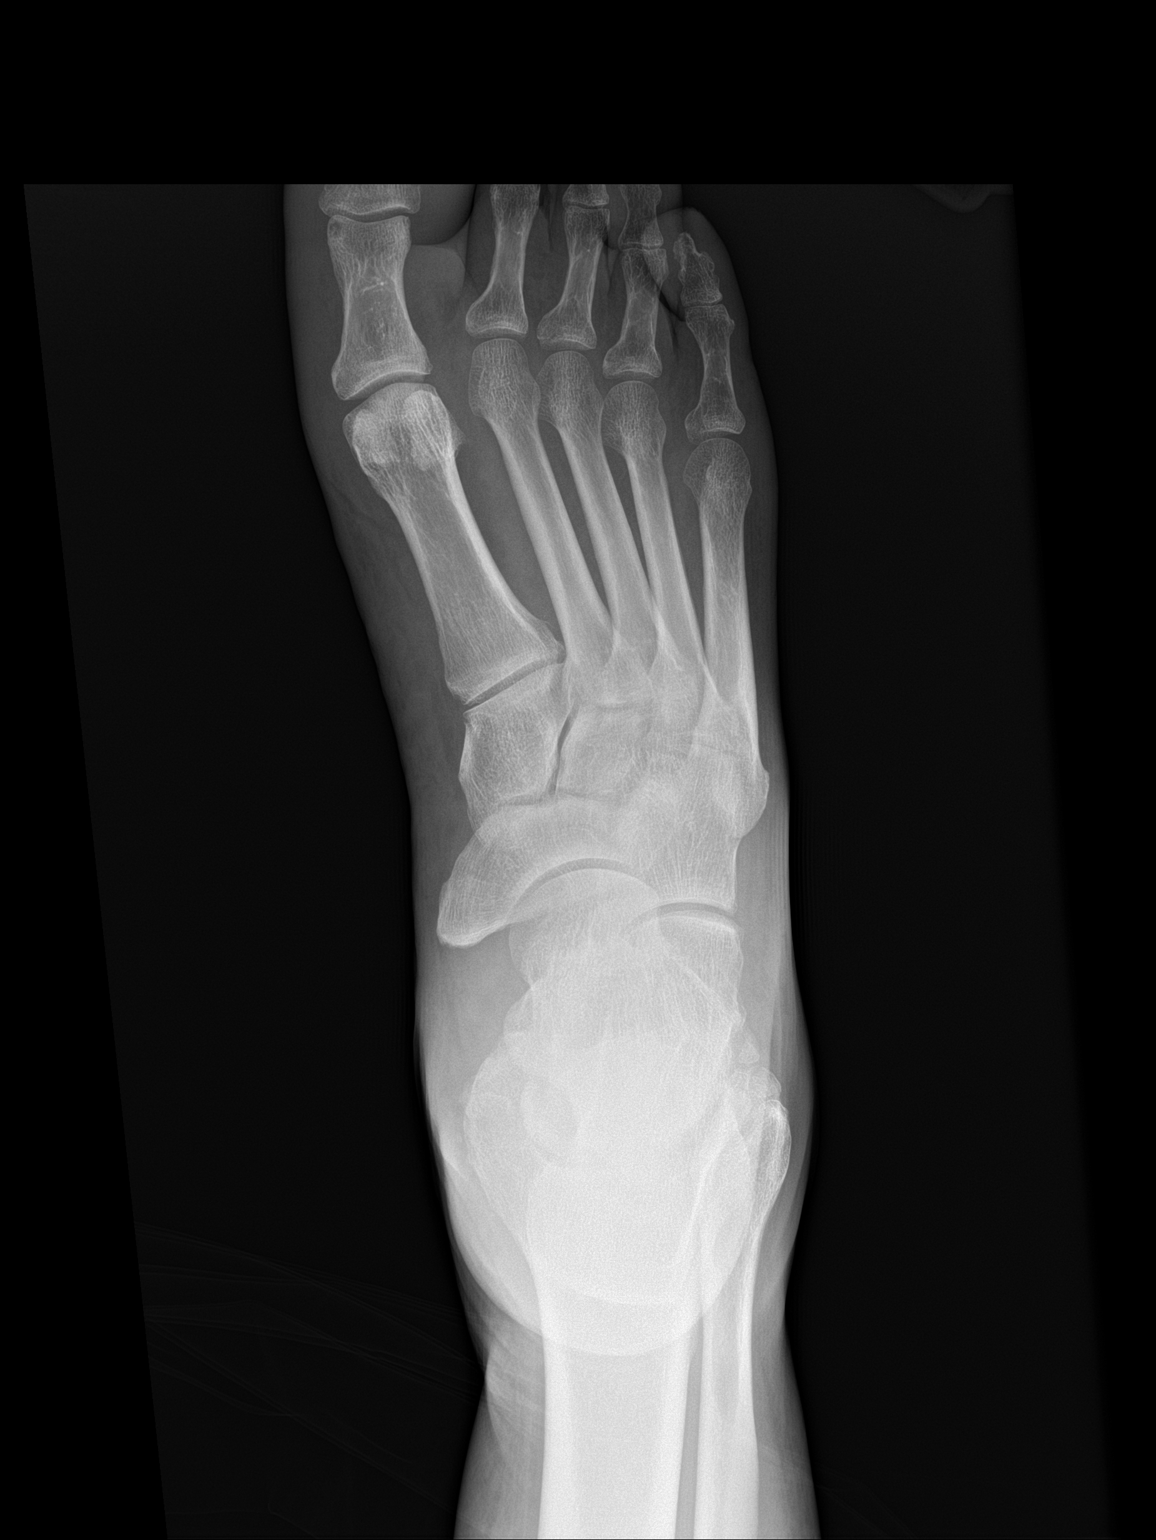

[foot obl]
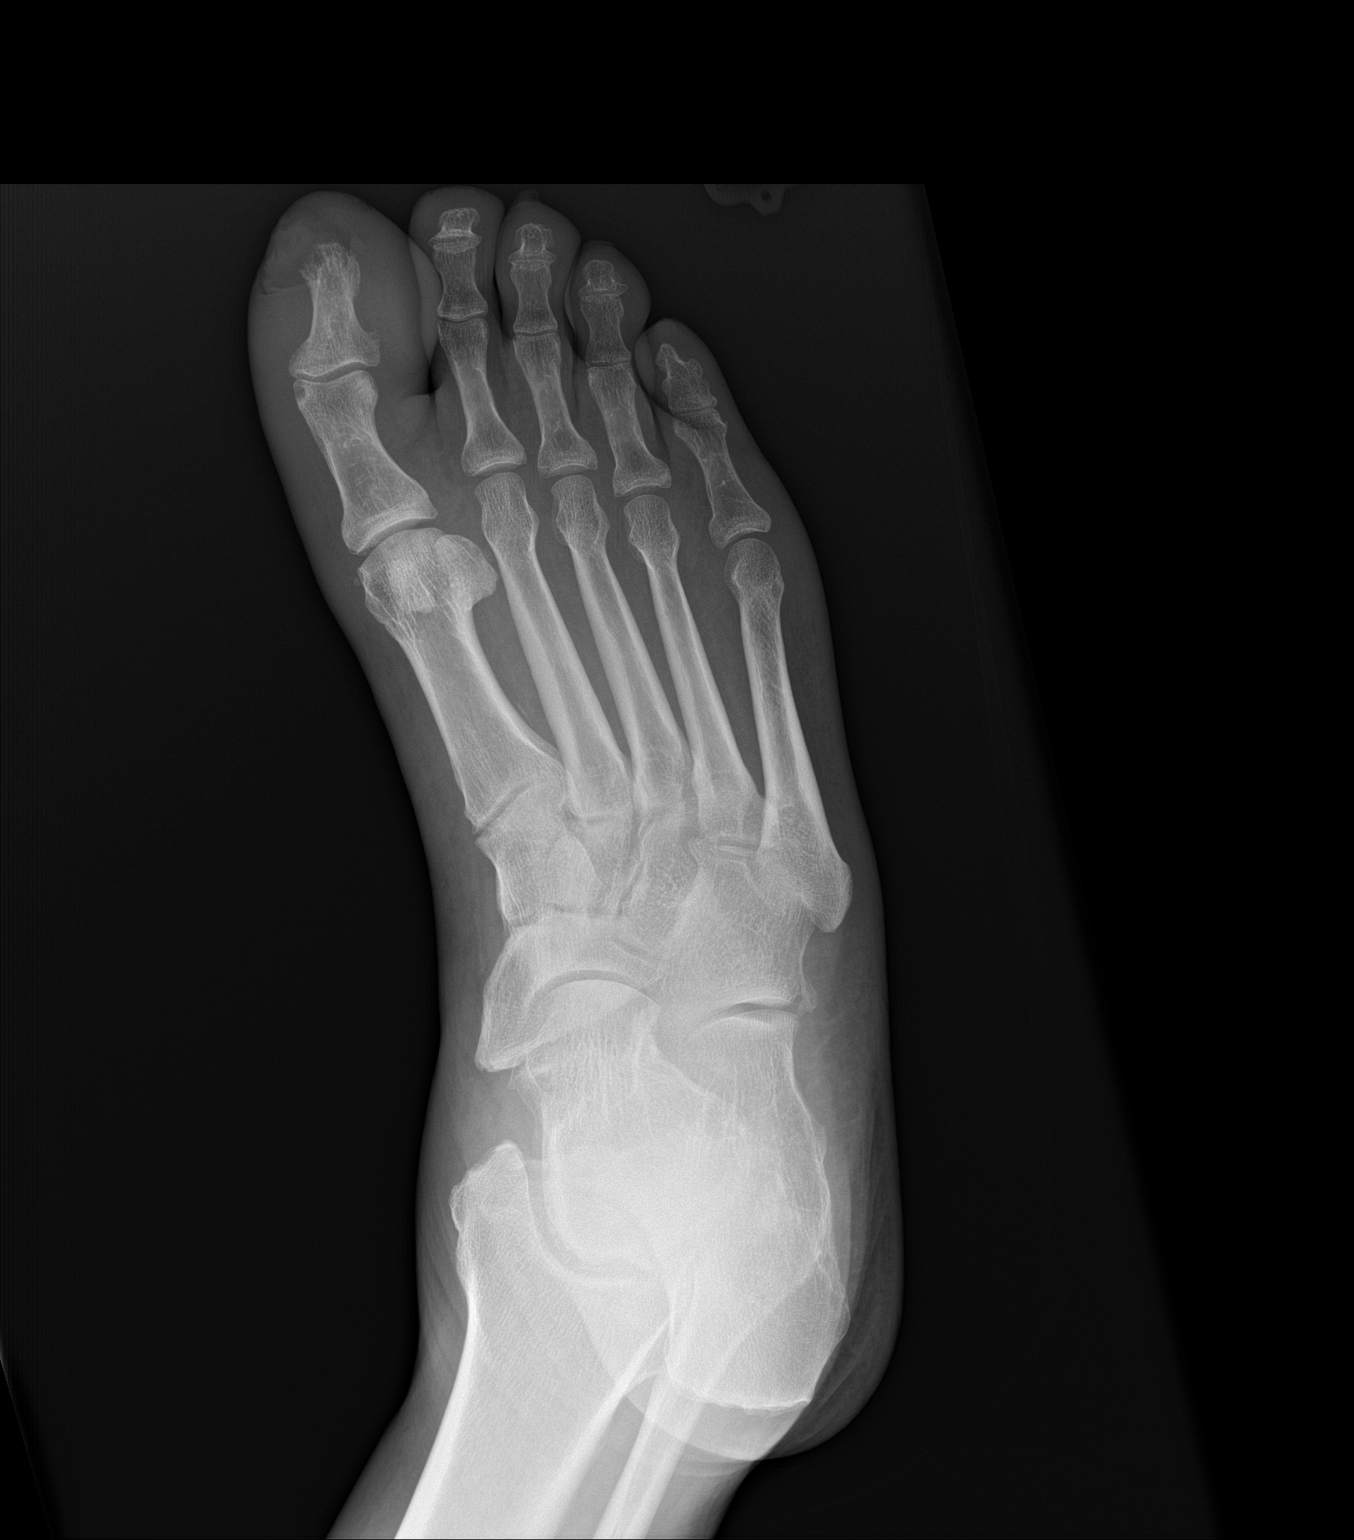

[foot lat]
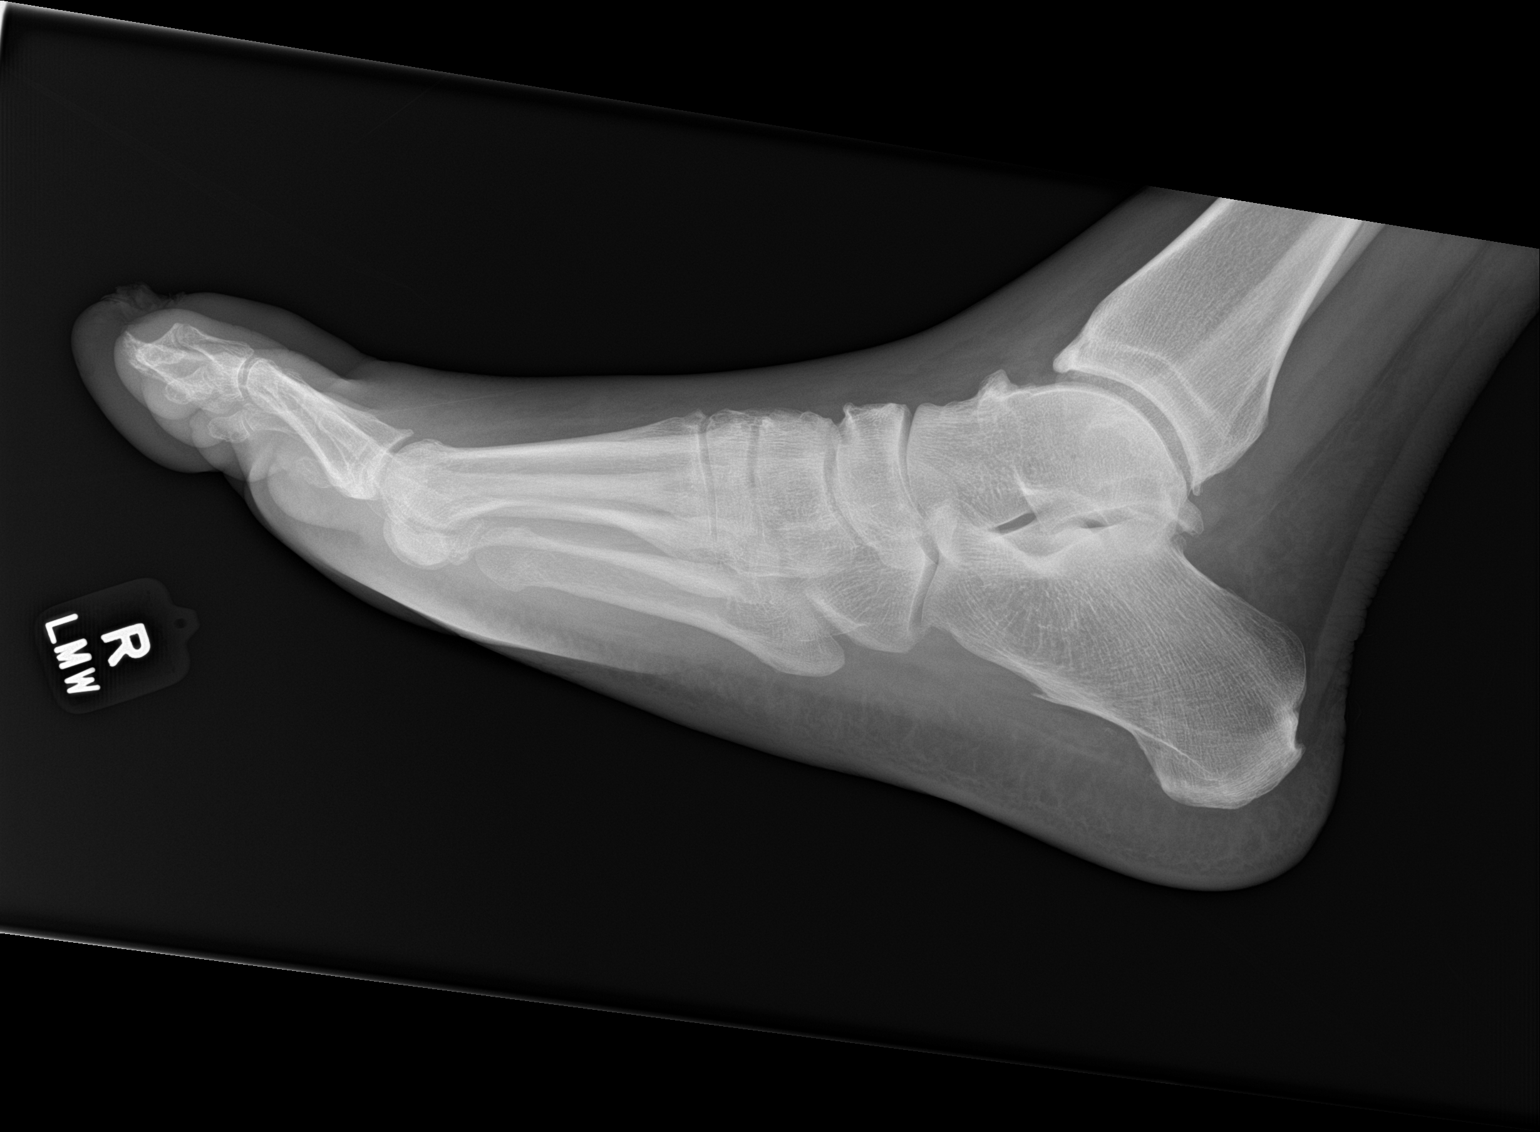

[3 of 3 positions shown; findings below may reference images not displayed]

FINDINGS: Interval increase in bony erosion of the tuft of the distal phalanx
right great toe. No other bony abnormality. No fracture. Joint
spaces are preserved. Soft tissue edema about the great toe.
IMPRESSION: 1. Interval increase in bony erosion of the tuft of the distal
phalanx right great toe, consistent with osteomyelitis.
2. Soft tissue edema about the great toe.

## 2021-03-24 IMAGING — US US EXTREM LOW VENOUS*R*
1 series · 14 of 24 positions shown · non-contrast
Comparison: [DATE]

CLINICAL DATA: Right leg pain, palpable cord around femoral vein

EXAM:
RIGHT LOWER EXTREMITY VENOUS DOPPLER ULTRASOUND
TECHNIQUE: Gray-scale sonography with compression, as well as color and duplex
ultrasound, were performed to evaluate the deep venous system(s)
from the level of the common femoral vein through the popliteal and
proximal calf veins.

[Series 1: us venous img lower uni right (dvt) · portal-venous · 14 of 27 slices shown]
[im 1/27]
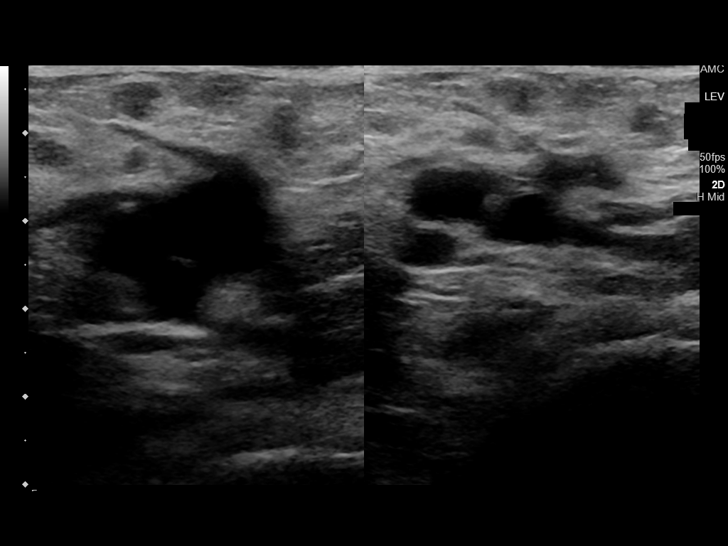
[im 3/27]
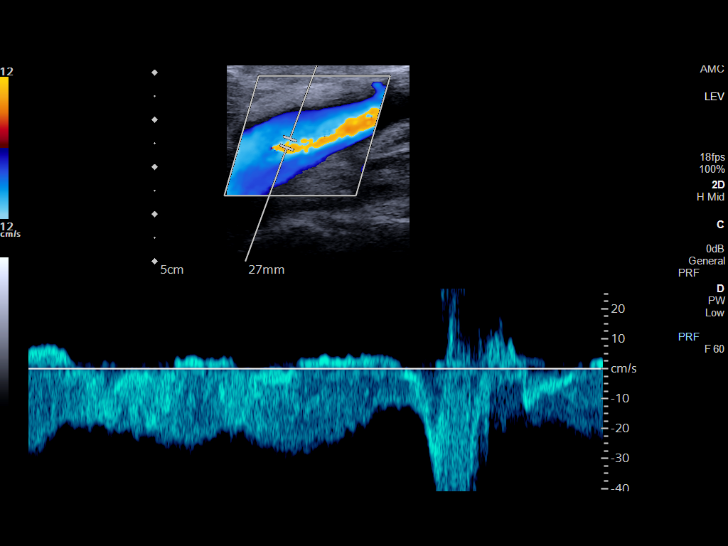
[im 5/27]
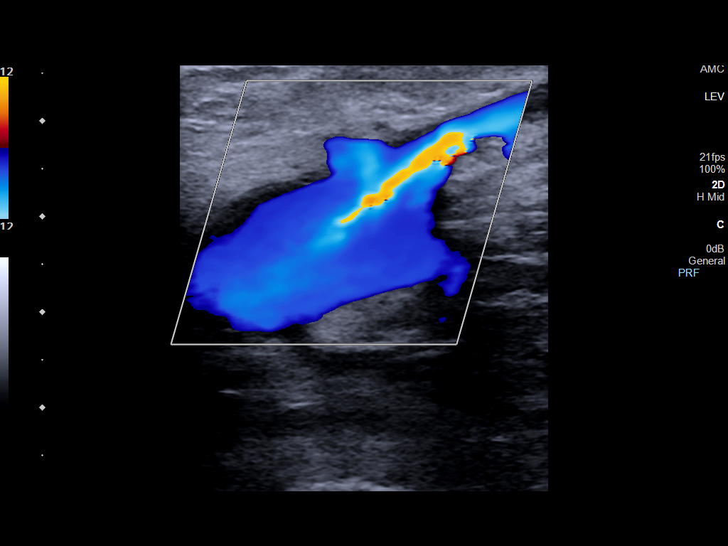
[im 7/27]
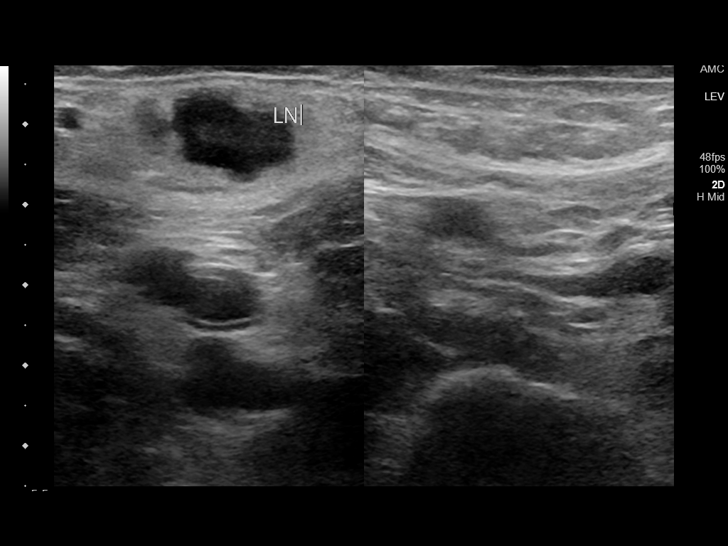
[im 8/27]
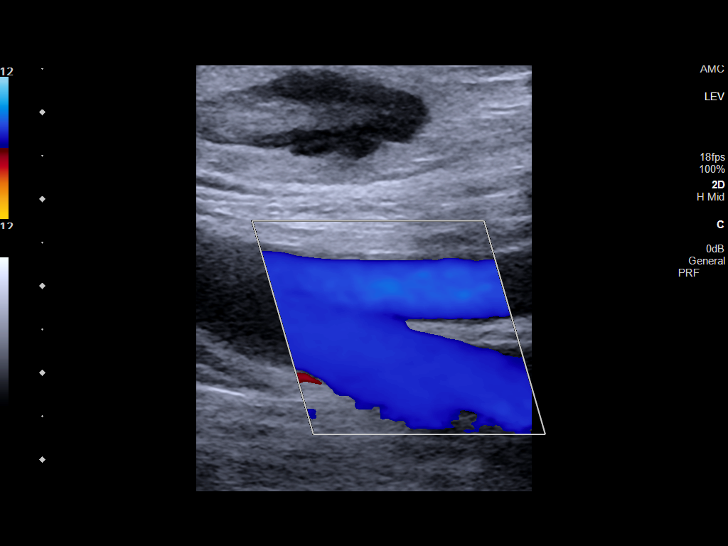
[im 11/27]
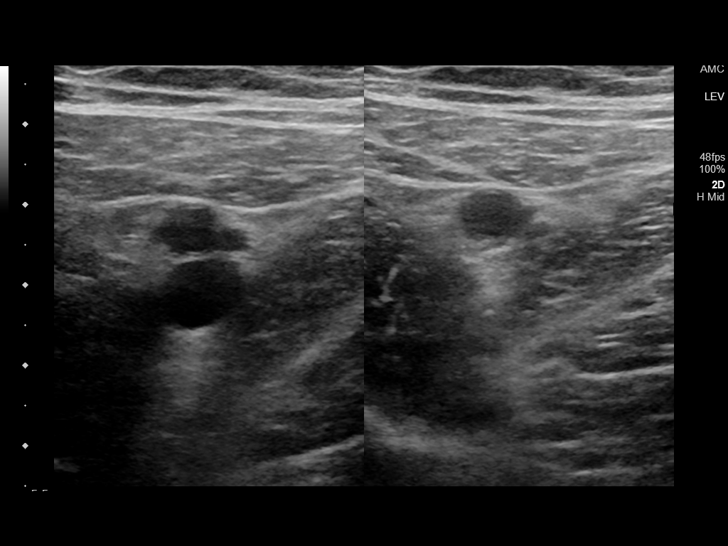
[im 13/27]
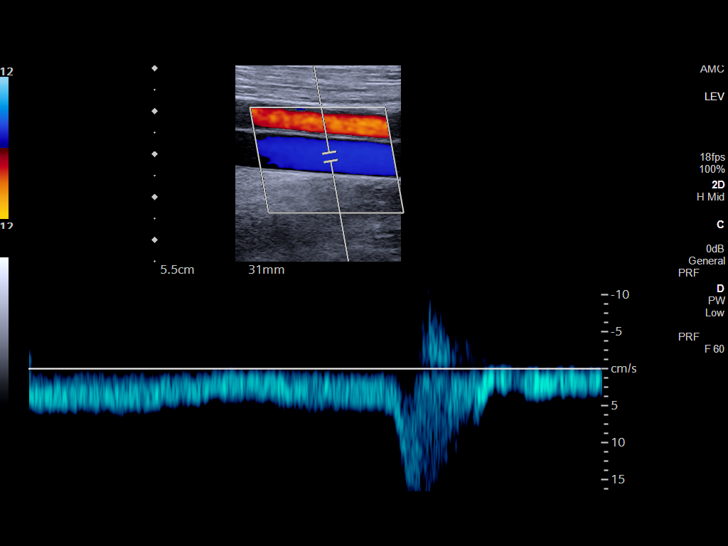
[im 14/27]
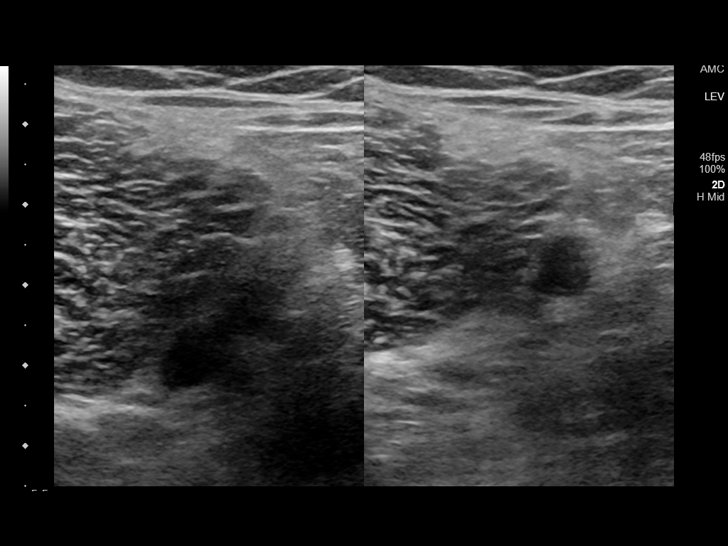
[im 16/27]
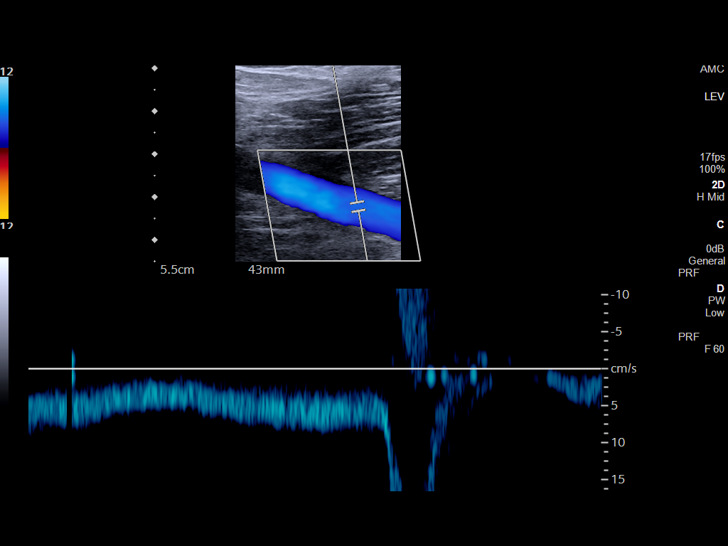
[im 19/27]
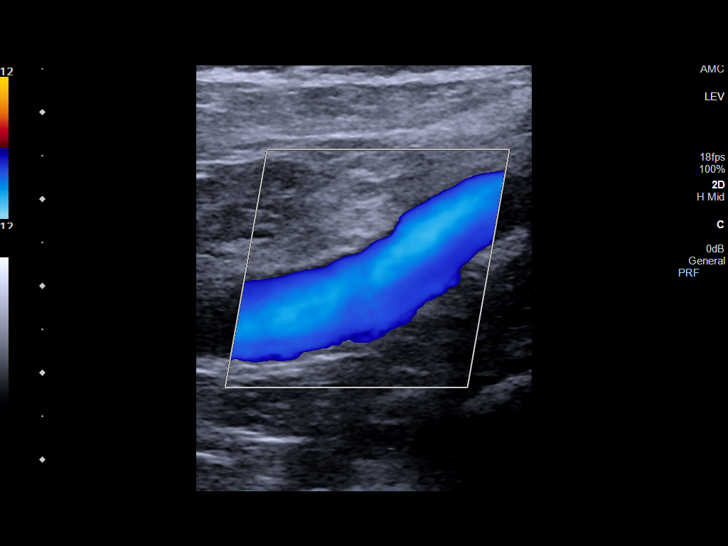
[im 21/27]
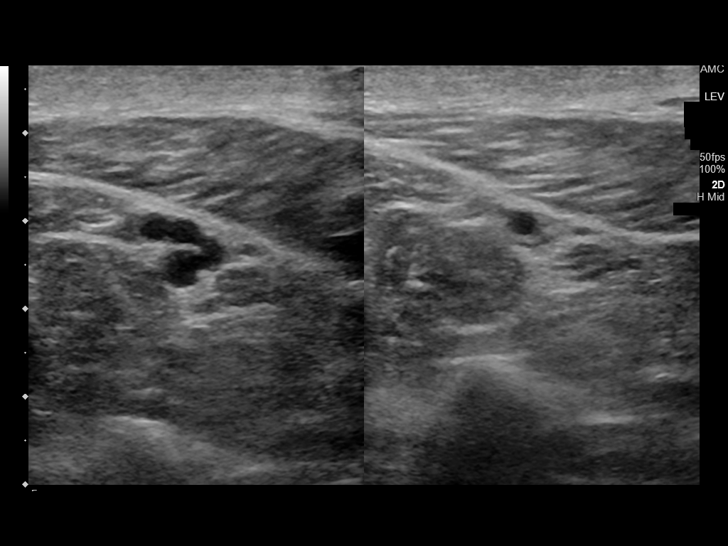
[im 22/27]
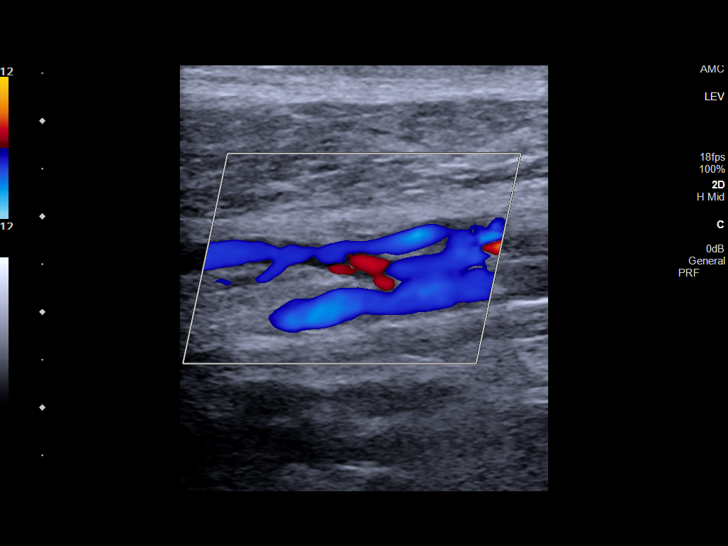
[im 24/27]
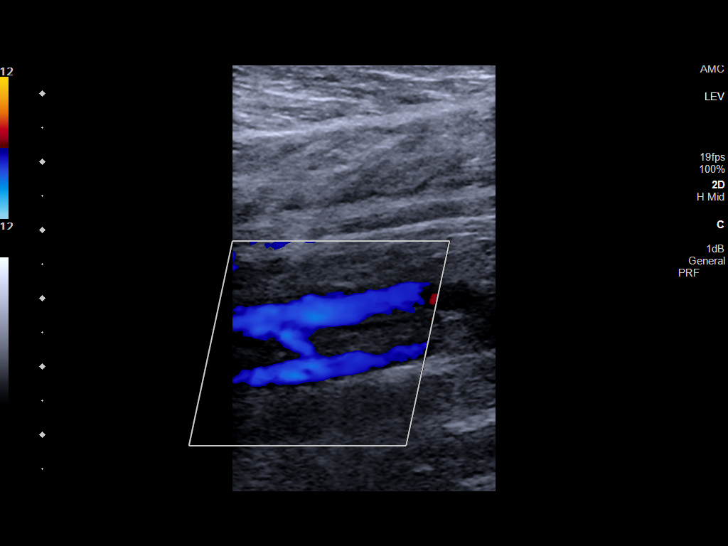
[im 27/27]
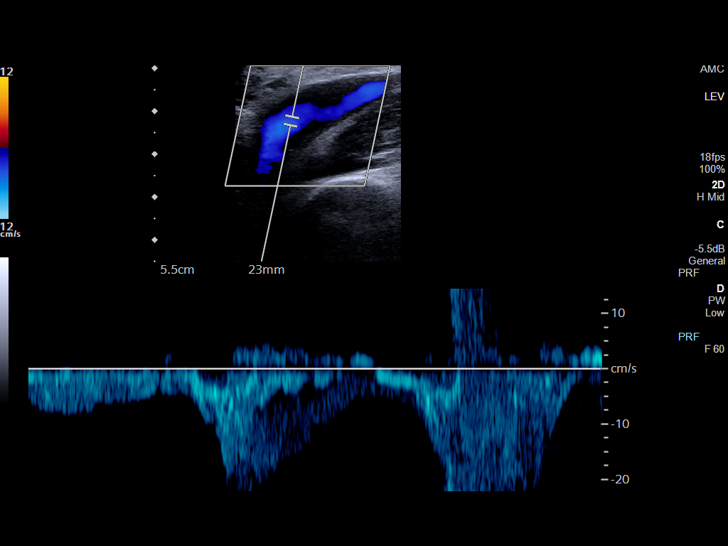

[14 of 24 positions shown; findings below may reference images not displayed]

FINDINGS: VENOUS

Normal compressibility of the common femoral, superficial femoral,
and popliteal veins, as well as the visualized calf veins.
Visualized portions of profunda femoral vein and great saphenous
vein unremarkable. No filling defects to suggest DVT on grayscale or
color Doppler imaging. Doppler waveforms show normal direction of
venous flow, normal respiratory plasticity and response to
augmentation.

Limited views of the contralateral common femoral vein are
unremarkable.

OTHER

None.

Limitations: none
IMPRESSION: 1. No evidence of deep venous thrombosis within the right lower
extremity.

## 2021-03-24 MED ORDER — VANCOMYCIN HCL 10 G IV SOLR
1750.0000 mg | Freq: Once | INTRAVENOUS | Status: DC
  Filled 2021-03-24: qty 1750

## 2021-03-24 MED ORDER — VANCOMYCIN HCL 1750 MG/350ML IV SOLN
1750.0000 mg | Freq: Once | INTRAVENOUS | Status: AC
  Administered 2021-03-25: 1750 mg via INTRAVENOUS
  Filled 2021-03-24: qty 350

## 2021-03-24 MED ORDER — FUROSEMIDE 20 MG PO TABS: 20.0000 mg | ORAL_TABLET | Freq: Every day | ORAL | Status: DC | PRN

## 2021-03-24 MED ORDER — ASCORBIC ACID 500 MG PO TABS
250.0000 mg | ORAL_TABLET | Freq: Two times a day (BID) | ORAL | Status: DC
  Administered 2021-03-25 – 2021-04-05 (×21): 250 mg via ORAL
  Filled 2021-03-24 (×23): qty 1

## 2021-03-24 MED ORDER — SODIUM CHLORIDE 0.9 % IV SOLN: INTRAVENOUS | Status: DC

## 2021-03-24 MED ORDER — ONDANSETRON HCL 4 MG PO TABS
4.0000 mg | ORAL_TABLET | Freq: Four times a day (QID) | ORAL | Status: DC | PRN
Start: 2021-03-24 — End: 2021-04-05

## 2021-03-24 MED ORDER — TRAZODONE HCL 50 MG PO TABS
25.0000 mg | ORAL_TABLET | Freq: Every evening | ORAL | Status: DC | PRN
  Administered 2021-03-25 – 2021-04-04 (×2): 25 mg via ORAL
  Filled 2021-03-24 (×2): qty 1

## 2021-03-24 MED ORDER — ACETAMINOPHEN 325 MG PO TABS
650.0000 mg | ORAL_TABLET | Freq: Four times a day (QID) | ORAL | Status: DC | PRN
  Administered 2021-03-27 – 2021-04-04 (×4): 650 mg via ORAL
  Filled 2021-03-24 (×4): qty 2

## 2021-03-24 MED ORDER — ACETAMINOPHEN 650 MG RE SUPP: 650.0000 mg | Freq: Four times a day (QID) | RECTAL | Status: DC | PRN

## 2021-03-24 MED ORDER — GABAPENTIN 100 MG PO CAPS
100.0000 mg | ORAL_CAPSULE | Freq: Every day | ORAL | Status: DC
  Administered 2021-03-25 – 2021-04-04 (×12): 100 mg via ORAL
  Filled 2021-03-24 (×12): qty 1

## 2021-03-24 MED ORDER — FLUTICASONE PROPIONATE 50 MCG/ACT NA SUSP
2.0000 | Freq: Every day | NASAL | Status: DC
  Administered 2021-03-26 – 2021-04-03 (×4): 2 via NASAL
  Filled 2021-03-24: qty 16

## 2021-03-24 MED ORDER — SPIRONOLACTONE 25 MG PO TABS
25.0000 mg | ORAL_TABLET | Freq: Every day | ORAL | Status: DC
  Administered 2021-03-26 – 2021-04-05 (×10): 25 mg via ORAL
  Filled 2021-03-24 (×12): qty 1

## 2021-03-24 MED ORDER — ADULT MULTIVITAMIN W/MINERALS CH
1.0000 | ORAL_TABLET | Freq: Every day | ORAL | Status: DC
  Administered 2021-03-26 – 2021-04-05 (×10): 1 via ORAL
  Filled 2021-03-24 (×11): qty 1

## 2021-03-24 MED ORDER — SODIUM CHLORIDE 0.9 % IV SOLN
2.0000 g | Freq: Once | INTRAVENOUS | Status: AC
  Administered 2021-03-24: 2 g via INTRAVENOUS
  Filled 2021-03-24: qty 2

## 2021-03-24 MED ORDER — MAGNESIUM HYDROXIDE 400 MG/5ML PO SUSP: 30.0000 mL | Freq: Every day | ORAL | Status: DC | PRN

## 2021-03-24 MED ORDER — MORPHINE SULFATE (PF) 4 MG/ML IV SOLN
4.0000 mg | Freq: Once | INTRAVENOUS | Status: AC
  Administered 2021-03-24: 4 mg via INTRAVENOUS
  Filled 2021-03-24: qty 1

## 2021-03-24 MED ORDER — SODIUM CHLORIDE 0.9 % IV SOLN: 2.0000 g | Freq: Once | INTRAVENOUS | Status: DC

## 2021-03-24 MED ORDER — DAPAGLIFLOZIN PROPANEDIOL 5 MG PO TABS
5.0000 mg | ORAL_TABLET | Freq: Every day | ORAL | Status: DC
  Administered 2021-03-26 – 2021-04-05 (×10): 5 mg via ORAL
  Filled 2021-03-24 (×12): qty 1

## 2021-03-24 MED ORDER — LACTATED RINGERS IV BOLUS
2000.0000 mL | Freq: Once | INTRAVENOUS | Status: AC
  Administered 2021-03-24: 2000 mL via INTRAVENOUS

## 2021-03-24 MED ORDER — INSULIN GLARGINE 100 UNIT/ML SOLOSTAR PEN: 40.0000 [IU] | PEN_INJECTOR | Freq: Every day | SUBCUTANEOUS | Status: DC

## 2021-03-24 MED ORDER — VANCOMYCIN HCL IN DEXTROSE 1-5 GM/200ML-% IV SOLN: 1000.0000 mg | Freq: Once | INTRAVENOUS | Status: DC

## 2021-03-24 MED ORDER — CARVEDILOL 3.125 MG PO TABS
3.1250 mg | ORAL_TABLET | Freq: Two times a day (BID) | ORAL | Status: DC
  Administered 2021-03-25 – 2021-04-05 (×13): 3.125 mg via ORAL
  Filled 2021-03-24 (×17): qty 1

## 2021-03-24 MED ORDER — LOSARTAN POTASSIUM 25 MG PO TABS
25.0000 mg | ORAL_TABLET | Freq: Every day | ORAL | Status: DC
  Administered 2021-03-26 – 2021-04-05 (×6): 25 mg via ORAL
  Filled 2021-03-24 (×10): qty 1

## 2021-03-24 MED ORDER — ONDANSETRON HCL 4 MG/2ML IJ SOLN
4.0000 mg | Freq: Four times a day (QID) | INTRAMUSCULAR | Status: DC | PRN
  Administered 2021-03-25 – 2021-03-28 (×2): 4 mg via INTRAVENOUS
  Filled 2021-03-24 (×2): qty 2

## 2021-03-24 MED ORDER — LACTATED RINGERS IV BOLUS: 1000.0000 mL | Freq: Once | INTRAVENOUS | Status: DC

## 2021-03-24 MED ORDER — MORPHINE SULFATE (PF) 2 MG/ML IV SOLN
2.0000 mg | INTRAVENOUS | Status: DC | PRN
  Administered 2021-03-25 – 2021-03-29 (×12): 2 mg via INTRAVENOUS
  Filled 2021-03-24 (×13): qty 1

## 2021-03-24 NOTE — H&P (Signed)
[place]   PATIENT NAME: Joshua [name]    MR#:  [id number]  DATE OF BIRTH:  1971-09-26  DATE OF ADMISSION:  03/24/2021  PRIMARY CARE PHYSICIAN: Patient, No Pcp Per (Inactive)   Patient is coming from: Home  REQUESTING/REFERRING PHYSICIAN: Vladimir Crofts, MD CHIEF COMPLAINT:   Chief Complaint  Patient presents with  . Leg Pain    HISTORY OF PRESENT ILLNESS:  Joshua [name] is a 50 y.o. African-American male with medical history significant for type II obese mellitus, CHF and hypertension, presented to the emergency room with acute onset of worsening right foot swelling extending from his big toe to the lower leg currently is associated erythema, tenderness, tightness with warmth.  He stated that it has not been able to put weight on his right leg.he admitted to fever and chills.  He has been having some nausea and vomiting without abdominal pain.  No bilious vomitus or hematemesis.  No chest pain or dyspnea or cough or wheezing.  The patient had an osteomyelitis of the distal phalanx of the right big toe on 02/15/2019 22 foot MRI at which time he refused amputation. ED Course: When he came to the ER, he was febrile to 101 with a heart rate 108 and respiratory to 22 with normal pulse oximetry.  Labs revealed mild hyponatremia and glucose of 190 with albumin 3.3.  Lactic acid was 1.1.  CBC showed leukocytosis of 18.9 and anemia.  Blood cultures were drawn. COVID-19 PCR is currently pending.  Imaging: Right foot x-ray revealed the following: 1. Interval increase in bony erosion of the tuft of the distal phalanx right great toe, consistent with osteomyelitis. 2. Soft tissue edema about the great toe.  The patient was given IV vancomycin and cefepime and tolerated bolus of IV lactated Ringer as well as 4 mg of IV morphine sulfate.  He will be admitted to a progressive unit bed for further evaluation and management.  PAST MEDICAL HISTORY:   Past Medical History:   Diagnosis Date  . CHF (congestive heart failure) ([place])   . Diabetes mellitus without complication ([place])   . Hypertension     PAST SURGICAL HISTORY:   Past Surgical History:  Procedure Laterality Date  . CORONARY/GRAFT ACUTE MI REVASCULARIZATION N/A 09/09/2018   Procedure: Coronary/Graft Acute MI Revascularization;  Surgeon: Yolonda Kida, MD;  Location: [facility];  Service: Cardiovascular;  Laterality: N/A;  . LEFT HEART CATH AND CORONARY ANGIOGRAPHY N/A 09/09/2018   Procedure: LEFT HEART CATH AND CORONARY ANGIOGRAPHY;  Surgeon: Yolonda Kida, MD;  Location: [facility];  Service: Cardiovascular;  Laterality: N/A;  . LOWER EXTREMITY ANGIOGRAPHY Right 02/14/2021   Procedure: Lower Extremity Angiography;  Surgeon: Katha Cabal, MD;  Location: [facility];  Service: Cardiovascular;  Laterality: Right;  . none    . STENT PLACEMENT VASCULAR ([facility])      SOCIAL HISTORY:   Social History   Tobacco Use  . Smoking status: Current Every Day Smoker  . Smokeless tobacco: Never Used  Substance Use Topics  . Alcohol use: Yes    Alcohol/week: 0.0 standard drinks    FAMILY HISTORY:   Family History  Problem Relation Age of Onset  . Cancer Mother   . CAD Brother     DRUG ALLERGIES:  No Known Allergies  REVIEW OF SYSTEMS:   ROS As per history of present illness. All pertinent systems were reviewed above. Constitutional, HEENT, cardiovascular, respiratory, GI,  GU, musculoskeletal, neuro, psychiatric, endocrine, integumentary and hematologic systems were reviewed and are otherwise negative/unremarkable except for positive findings mentioned above in the HPI.   MEDICATIONS AT HOME:   Prior to Admission medications   Medication Sig Start Date End Date Taking? Authorizing Provider  amoxicillin-clavulanate (AUGMENTIN) 875-125 MG tablet Take 1 tablet by mouth 2 (two) times daily for 14 days. 03/14/21 03/28/21  Jennye Boroughs, MD  ascorbic acid  (VITAMIN C) 250 MG tablet Take 1 tablet (250 mg total) by mouth 2 (two) times daily. 02/19/21   Lorella Nimrod, MD  carvedilol (COREG) 3.125 MG tablet Take 1 tablet (3.125 mg total) by mouth 2 (two) times daily with a meal. 02/19/21   Lorella Nimrod, MD  dapagliflozin propanediol (FARXIGA) 5 MG TABS tablet Take 1 tablet (5 mg total) by mouth daily. 02/19/21   Lorella Nimrod, MD  fluticasone (FLONASE) 50 MCG/ACT nasal spray Place 2 sprays into both nostrils daily. 10/05/20   Lavina Hamman, MD  furosemide (LASIX) 20 MG tablet Take 1 tablet (20 mg total) by mouth daily as needed (for weight gain >3lbs in 1 days and >5lbs in 2 days). 02/19/21 02/19/22  Lorella Nimrod, MD  gabapentin (NEURONTIN) 100 MG capsule Take 1 capsule (100 mg total) by mouth at bedtime. 02/19/21   Lorella Nimrod, MD  insulin glargine (LANTUS) 100 UNIT/ML Solostar Pen Inject 40 Units into the skin daily. 02/19/21   Lorella Nimrod, MD  Insulin Pen Needle (PEN NEEDLES) 31G X 5 MM MISC 40 Units by Does not apply route at bedtime. 02/19/21   Lorella Nimrod, MD  losartan (COZAAR) 25 MG tablet Take 1 tablet (25 mg total) by mouth daily. 02/19/21 02/19/22  Lorella Nimrod, MD  metFORMIN (GLUCOPHAGE) 500 MG tablet Take 1 tablet (500 mg total) by mouth daily with breakfast. 02/19/21 04/20/21  Lorella Nimrod, MD  Multiple Vitamin (MULTIVITAMIN WITH MINERALS) TABS tablet Take 1 tablet by mouth daily. 02/19/21   Lorella Nimrod, MD  spironolactone (ALDACTONE) 25 MG tablet Take 1 tablet (25 mg total) by mouth daily. 02/19/21   Lorella Nimrod, MD      VITAL SIGNS:  Blood pressure (!) 124/92, pulse 95, temperature (!) 101 F (38.3 C), temperature source Rectal, resp. rate 19, height 5\' 10"  (1.778 m), weight 82.5 kg, SpO2 99 %.  PHYSICAL EXAMINATION:  Physical Exam  GENERAL:  50 y.o.-year-old African-American male patient lying in the bed with no acute distress.  EYES: Pupils equal, round, reactive to light and accommodation. No scleral icterus. Extraocular muscles intact.   HEENT: Head atraumatic, normocephalic. Oropharynx and nasopharynx clear.  NECK:  Supple, no jugular venous distention. No thyroid enlargement, no tenderness.  LUNGS: Normal breath sounds bilaterally, no wheezing, rales,rhonchi or crepitation. No use of accessory muscles of respiration.  CARDIOVASCULAR: Regular rate and rhythm, S1, S2 normal. No murmurs, rubs, or gallops.  ABDOMEN: Soft, nondistended, nontender. Bowel sounds present. No organomegaly or mass.  EXTREMITIES: No pedal edema, cyanosis, or clubbing.  NEUROLOGIC: Cranial nerves II through XII are intact. Muscle strength 5/5 in all extremities. Sensation intact. Gait not checked.  PSYCHIATRIC: The patient is alert and oriented x 3.  Normal affect and good eye contact. SKIN: Right big toe swelling and erythema with tenderness with ulceration in the nailbed that is absent and extension to the foot as well as the lower leg with associated warmth throughout.    LABORATORY PANEL:   CBC Recent Labs  Lab 03/24/21 2144  WBC 18.9*  HGB 10.4*  HCT 33.1*  PLT 424*   ------------------------------------------------------------------------------------------------------------------  Chemistries  Recent Labs  Lab 03/24/21 2144  NA 133*  K 4.2  CL 100  CO2 26  GLUCOSE 190*  BUN 8  CREATININE 0.91  CALCIUM 8.4*  AST 15  ALT 10  ALKPHOS 63  BILITOT 0.8   ------------------------------------------------------------------------------------------------------------------  Cardiac Enzymes No results for input(s): TROPONINI in the last 168 hours. ------------------------------------------------------------------------------------------------------------------  RADIOLOGY:  DG Foot Complete Right  Result Date: 03/24/2021 CLINICAL DATA:  Great toe osteomyelitis EXAM: RIGHT FOOT COMPLETE - 3+ VIEW COMPARISON:  03/11/2021 FINDINGS: Interval increase in bony erosion of the tuft of the distal phalanx right great toe. No other bony  abnormality. No fracture. Joint spaces are preserved. Soft tissue edema about the great toe. IMPRESSION: 1. Interval increase in bony erosion of the tuft of the distal phalanx right great toe, consistent with osteomyelitis. 2. Soft tissue edema about the great toe. Electronically Signed   By: Eddie Candle M.D.   On: 03/24/2021 22:54      IMPRESSION AND PLAN:  Active Problems:   Acute osteomyelitis of toe, right ([place])   1.  Acute osteomyelitis of the right big toe with severe nonpurulent cellulitis of the right foot and lower leg. -The patient will be admitted to a progressive unit bed. -We will continue IV antibiotic therapy with IV cefepime and vancomycin per protocol. -Pain management will be provided. -Warm compresses will be given. -Podiatry consult will be obtained. -I notified Dr. Luana Shu about the patient.  He is more willing to consider amputation at this time.  2.  Sepsis secondary to osteomyelitis, as manifested by significant leukocytosis, fever, tachycardia and tachypnea, possibly secondary to noncompliance. -IV antibiotic antibiotics as above. -We will continue hydration with IV normal saline. -We will follow blood cultures.  3.  Essential hypertension. -We will continue Coreg and Cozaar.  4.  Type 2 diabetes mellitus with peripheral neuropathy. -We will place the patient on supplement coverage with NovoLog and hold Glucophage. -Will continue basal coverage. -Metformin will be held off.  DVT prophylaxis: SCDs.  Medical prophylaxis currently held off for potential surgical intervention Code Status: full code. Family Communication:  The plan of care was discussed in details with the patient (who requested no other family members to be notified at this time.. I answered all questions. The patient agreed to proceed with the above mentioned plan. Further management will depend upon hospital course. Disposition Plan: Back to previous home environment Consults called: Podiatry  consult with Dr. Luana Shu. All the records are reviewed and case discussed with ED provider.  Status is: Inpatient  Remains inpatient appropriate because:Ongoing active pain requiring inpatient pain management, Ongoing diagnostic testing needed not appropriate for outpatient work up, Unsafe d/c plan, IV treatments appropriate due to intensity of illness or inability to take PO and Inpatient level of care appropriate due to severity of illness   Dispo: The patient is from: Home              Anticipated d/c is to: Home              Patient currently is not medically stable to d/c.   Difficult to place patient No  TOTAL TIME TAKING CARE OF THIS PATIENT: 55 minutes.    Christel Mormon M.D on 03/24/2021 at 11:33 PM  [facility]   From 7 PM-7 AM, contact night-coverage [email or web address]  CC: Primary care physician; Patient, No Pcp Per (Inactive)

## 2021-03-24 NOTE — ED Triage Notes (Signed)
Pt BIBA via [facility] [place] bus [place]. Pt c/o of R leg pain. EMS reports that pt was recently d/c from ED with rx for amoxicillin for infection he had 1 week ago. Pt states he is allergic penicillins, and does not know how much of antibiotics he has taken.

## 2021-03-24 NOTE — ED Provider Notes (Addendum)
[facility] Emergency Department Provider Note ____________________________________________   Event Date/Time   First MD Initiated Contact with Patient 03/24/21 2139     (approximate)  I have reviewed the triage vital signs and the nursing notes.  HISTORY  Chief Complaint Leg Pain   HPI Joshua [name] is a 50 y.o. malewho presents to the ED for evaluation of   Chart review indicates recent admission 3/29-4/1 due to multifocal pneumonia.  History of HTN, DM on insulin, systolic CHF with EF 39%.  CAD, cocaine abuse.  Homelessness.  Osteomyelitis to the right great toe with multiple admissions in March for this.  Patient was recommended amputation of the distal third of the affected right great toe, but he refused this.  Patient presents to the ED with worsening pain and swelling to his right toe and foot the past few days.  Patient is uncertain if he has been taking the antibiotics provided to him at discharge, Augmentin.  He reports that he has been bouncing around the house as a friend's couches, and reports continued homelessness.  He reports continued pain to his right foot that is benign, severe and unbearable.  Reports 10/10 pain.  Reports subjective chills without documented fevers.  Denies chest pain, syncope, abdominal pain, emesis or diarrhea.  Does report pain to his proximal right thigh and a bump there.  Denies difficulty urinating, denies scrotal pain.   Past Medical History:  Diagnosis Date  . CHF (congestive heart failure) ([place])   . Diabetes mellitus without complication ([place])   . Hypertension     Patient Active Problem List   Diagnosis Date Noted  . Acute osteomyelitis of toe, right ([place]) 03/24/2021  . Community acquired pneumonia 03/11/2021  . CAD (coronary artery disease) 03/11/2021  . Sepsis ([place]) 03/11/2021  . Chest pain 03/11/2021  . HTN (hypertension) 03/11/2021  . Type 2 diabetes mellitus with complications ([place]) 76/73/4193   . Tobacco abuse 03/11/2021  . Polysubstance abuse ([place])   . Osteomyelitis of great toe of right foot ([place]) 02/18/2021  . Malnutrition of moderate degree 02/14/2021  . Cocaine abuse ([place]) 02/13/2021  . Homeless 02/13/2021  . Cellulitis of great toe of right foot 02/12/2021  . Diabetic foot ulcer ([place]) 02/12/2021  . Lactic acidosis 02/12/2021  . Acute metabolic encephalopathy 79/01/4096  . Chronic combined systolic (congestive) and diastolic (congestive) heart failure ([place]) 02/12/2021  . SIRS (systemic inflammatory response syndrome) ([place]) 10/04/2020  . Acute respiratory failure with hypoxia ([place]) 07/15/2020  . Multifocal pneumonia 07/15/2020  . History of MI (myocardial infarction) 07/15/2020  . Symptomatic anemia 07/15/2020  . Acute ST elevation myocardial infarction (STEMI) involving left anterior descending (LAD) coronary artery ([place]) 09/09/2018  . STEMI involving left anterior descending coronary artery ([place]) 09/09/2018  . Hypoglycemia 06/02/2016    Past Surgical History:  Procedure Laterality Date  . CORONARY/GRAFT ACUTE MI REVASCULARIZATION N/A 09/09/2018   Procedure: Coronary/Graft Acute MI Revascularization;  Surgeon: Yolonda Kida, MD;  Location: [place] [facility];  Service: Cardiovascular;  Laterality: N/A;  . LEFT HEART CATH AND CORONARY ANGIOGRAPHY N/A 09/09/2018   Procedure: LEFT HEART CATH AND CORONARY ANGIOGRAPHY;  Surgeon: Yolonda Kida, MD;  Location: [place] [facility];  Service: Cardiovascular;  Laterality: N/A;  . LOWER EXTREMITY ANGIOGRAPHY Right 02/14/2021   Procedure: Lower Extremity Angiography;  Surgeon: Katha Cabal, MD;  Location: [place] [facility];  Service: Cardiovascular;  Laterality: Right;  . none    . STENT PLACEMENT VASCULAR [facility]  HX)      Prior to Admission medications   Medication Sig Start Date End Date Taking? Authorizing Provider  amoxicillin-clavulanate (AUGMENTIN) 875-125 MG tablet Take 1 tablet by mouth 2 (two) times  daily for 14 days. 03/14/21 03/28/21  Jennye Boroughs, MD  ascorbic acid (VITAMIN C) 250 MG tablet Take 1 tablet (250 mg total) by mouth 2 (two) times daily. 02/19/21   Lorella Nimrod, MD  carvedilol (COREG) 3.125 MG tablet Take 1 tablet (3.125 mg total) by mouth 2 (two) times daily with a meal. 02/19/21   Lorella Nimrod, MD  dapagliflozin propanediol (FARXIGA) 5 MG TABS tablet Take 1 tablet (5 mg total) by mouth daily. 02/19/21   Lorella Nimrod, MD  fluticasone (FLONASE) 50 MCG/ACT nasal spray Place 2 sprays into both nostrils daily. 10/05/20   Lavina Hamman, MD  furosemide (LASIX) 20 MG tablet Take 1 tablet (20 mg total) by mouth daily as needed (for weight gain >3lbs in 1 days and >5lbs in 2 days). 02/19/21 02/19/22  Lorella Nimrod, MD  gabapentin (NEURONTIN) 100 MG capsule Take 1 capsule (100 mg total) by mouth at bedtime. 02/19/21   Lorella Nimrod, MD  insulin glargine (LANTUS) 100 UNIT/ML Solostar Pen Inject 40 Units into the skin daily. 02/19/21   Lorella Nimrod, MD  Insulin Pen Needle (PEN NEEDLES) 31G X 5 MM MISC 40 Units by Does not apply route at bedtime. 02/19/21   Lorella Nimrod, MD  losartan (COZAAR) 25 MG tablet Take 1 tablet (25 mg total) by mouth daily. 02/19/21 02/19/22  Lorella Nimrod, MD  metFORMIN (GLUCOPHAGE) 500 MG tablet Take 1 tablet (500 mg total) by mouth daily with breakfast. 02/19/21 04/20/21  Lorella Nimrod, MD  Multiple Vitamin (MULTIVITAMIN WITH MINERALS) TABS tablet Take 1 tablet by mouth daily. 02/19/21   Lorella Nimrod, MD  spironolactone (ALDACTONE) 25 MG tablet Take 1 tablet (25 mg total) by mouth daily. 02/19/21   Lorella Nimrod, MD    Allergies Patient has no known allergies.  Family History  Problem Relation Age of Onset  . Cancer Mother   . CAD Brother     Social History Social History   Tobacco Use  . Smoking status: Current Every Day Smoker  . Smokeless tobacco: Never Used  Vaping Use  . Vaping Use: Never used  Substance Use Topics  . Alcohol use: Yes    Alcohol/week: 0.0 standard  drinks  . Drug use: Not Currently    Comment: pt states no found unknown white substance in mouth    Review of Systems  Constitutional: Positive for subjective fever/chills Eyes: No visual changes. ENT: No sore throat. Cardiovascular: Denies chest pain. Respiratory: Denies shortness of breath. Gastrointestinal: No abdominal pain.  No nausea, no vomiting.  No diarrhea.  No constipation. Genitourinary: Negative for dysuria. Musculoskeletal: Negative for back pain.  Positive for right foot and leg pain. Skin: Negative for rash. Neurological: Negative for headaches, focal weakness or numbness.   ____________________________________________   PHYSICAL EXAM:  VITAL SIGNS: Vitals:   03/24/21 2241 03/24/21 2252  BP:  (!) 124/92  Pulse: (!) 101 95  Resp: 19 19  Temp: (!) 101 F (38.3 C)   SpO2: 99% 99%    Constitutional: Alert and oriented.  Covered beneath multiple blankets.  Appears uncomfortable.  Warm to the touch and feels febrile. Eyes: Conjunctivae are normal. PERRL. EOMI. Head: Atraumatic. Nose: No congestion/rhinnorhea. Mouth/Throat: Mucous membranes are dry.  Oropharynx non-erythematous. Neck: No stridor. No cervical spine tenderness to palpation. Cardiovascular: Tachycardic rate,  regular rhythm. Grossly normal heart sounds.  Good peripheral circulation. Respiratory: Normal respiratory effort.  No retractions. Lungs CTAB. Gastrointestinal: Soft , nondistended, nontender to palpation. No CVA tenderness. Musculoskeletal: Right great toe is swollen, erythematous and exquisitely tender to palpation. Mild erythema, swelling and tenderness extends proximally up his right leg. I do palpate what appears to be a palpable cord to his proximal right thigh, concerning for SVT. Neurologic:  Normal speech and language. No gross focal neurologic deficits are appreciated.  Skin:  Skin is warm, dry and intact. No rash noted. Psychiatric: Mood and affect are normal. Speech and behavior  are normal. ____________________________________________   LABS (all labs ordered are listed, but only abnormal results are displayed)  Labs Reviewed  CBC WITH DIFFERENTIAL/PLATELET - Abnormal; Notable for the following components:      Result Value   WBC 18.9 (*)    RBC 3.96 (*)    Hemoglobin 10.4 (*)    HCT 33.1 (*)    Platelets 424 (*)    Neutro Abs 15.2 (*)    Monocytes Absolute 1.6 (*)    All other components within normal limits  COMPREHENSIVE METABOLIC PANEL - Abnormal; Notable for the following components:   Sodium 133 (*)    Glucose, Bld 190 (*)    Calcium 8.4 (*)    Albumin 3.3 (*)    All other components within normal limits  CULTURE, BLOOD (ROUTINE X 2)  CULTURE, BLOOD (ROUTINE X 2)  SARS CORONAVIRUS 2 (TAT 6-24 HRS)  LACTIC ACID, PLASMA  LACTIC ACID, PLASMA  BASIC METABOLIC PANEL  CBC    ____________________________________________  RADIOLOGY  ED MD interpretation: Plain film of the right foot reviewed by me with bony erosion to the right great toe concerning for osteomyelitis.  No fracture or dislocation noted.  Official radiology report(s): US Venous Img Lower Unilateral Right  Result Date: 03/24/2021 CLINICAL DATA:  Right leg pain, palpable cord around femoral vein EXAM: RIGHT LOWER EXTREMITY VENOUS DOPPLER ULTRASOUND TECHNIQUE: Gray-scale sonography with compression, as well as color and duplex ultrasound, were performed to evaluate the deep venous system(s) from the level of the common femoral vein through the popliteal and proximal calf veins. COMPARISON:  03/11/2021 FINDINGS: VENOUS Normal compressibility of the common femoral, superficial femoral, and popliteal veins, as well as the visualized calf veins. Visualized portions of profunda femoral vein and great saphenous vein unremarkable. No filling defects to suggest DVT on grayscale or color Doppler imaging. Doppler waveforms show normal direction of venous flow, normal respiratory plasticity and  response to augmentation. Limited views of the contralateral common femoral vein are unremarkable. OTHER None. Limitations: none IMPRESSION: 1. No evidence of deep venous thrombosis within the right lower extremity. Electronically Signed   By: Randa Ngo M.D.   On: 03/24/2021 23:50   DG Foot Complete Right  Result Date: 03/24/2021 CLINICAL DATA:  Great toe osteomyelitis EXAM: RIGHT FOOT COMPLETE - 3+ VIEW COMPARISON:  03/11/2021 FINDINGS: Interval increase in bony erosion of the tuft of the distal phalanx right great toe. No other bony abnormality. No fracture. Joint spaces are preserved. Soft tissue edema about the great toe. IMPRESSION: 1. Interval increase in bony erosion of the tuft of the distal phalanx right great toe, consistent with osteomyelitis. 2. Soft tissue edema about the great toe. Electronically Signed   By: Eddie Candle M.D.   On: 03/24/2021 22:54    ____________________________________________   PROCEDURES and INTERVENTIONS  Procedure(s) performed (including Critical Care):  .1-3 Lead EKG Interpretation Performed  by: Vladimir Crofts, MD Authorized by: Vladimir Crofts, MD     Interpretation: abnormal     ECG rate:  106   ECG rate assessment: tachycardic     Rhythm: sinus tachycardia     Ectopy: none     Conduction: normal    .Critical Care Performed by: Vladimir Crofts, MD Authorized by: Vladimir Crofts, MD   Critical care provider statement:    Critical care time (minutes):  45   Critical care was necessary to treat or prevent imminent or life-threatening deterioration of the following conditions:  Sepsis   Critical care was time spent personally by me on the following activities:  Discussions with consultants, evaluation of patient's response to treatment, examination of patient, ordering and performing treatments and interventions, ordering and review of laboratory studies, ordering and review of radiographic studies, pulse oximetry, re-evaluation of patient's condition,  obtaining history from patient or surrogate and review of old charts    Medications  vancomycin (VANCOREADY) IVPB 1750 mg/350 mL (1,750 mg Intravenous New Bag/Given 03/25/21 0002)  carvedilol (COREG) tablet 3.125 mg (has no administration in time range)  furosemide (LASIX) tablet 20 mg (has no administration in time range)  losartan (COZAAR) tablet 25 mg (has no administration in time range)  spironolactone (ALDACTONE) tablet 25 mg (has no administration in time range)  dapagliflozin propanediol (FARXIGA) tablet 5 mg (has no administration in time range)  insulin glargine (LANTUS) Solostar Pen 40 Units (has no administration in time range)  gabapentin (NEURONTIN) capsule 100 mg (has no administration in time range)  ascorbic acid (VITAMIN C) tablet 250 mg (has no administration in time range)  multivitamin with minerals tablet 1 tablet (has no administration in time range)  fluticasone (FLONASE) 50 MCG/ACT nasal spray 2 spray (has no administration in time range)  ceFEPIme (MAXIPIME) 2 g in sodium chloride 0.9 % 100 mL IVPB (has no administration in time range)  0.9 %  sodium chloride infusion (has no administration in time range)  acetaminophen (TYLENOL) tablet 650 mg (has no administration in time range)    Or  acetaminophen (TYLENOL) suppository 650 mg (has no administration in time range)  traZODone (DESYREL) tablet 25 mg (has no administration in time range)  magnesium hydroxide (MILK OF MAGNESIA) suspension 30 mL (has no administration in time range)  ondansetron (ZOFRAN) tablet 4 mg (has no administration in time range)    Or  ondansetron (ZOFRAN) injection 4 mg (has no administration in time range)  morphine 2 MG/ML injection 2 mg (has no administration in time range)  morphine 4 MG/ML injection 4 mg (4 mg Intravenous Given 03/24/21 2242)  ceFEPIme (MAXIPIME) 2 g in sodium chloride 0.9 % 100 mL IVPB (0 g Intravenous Stopped 03/24/21 2359)  lactated ringers bolus 2,000 mL (2,000 mLs  Intravenous New Bag/Given 03/24/21 2247)    ____________________________________________   MDM / ED COURSE   Undomiciled 50 year old gentleman presents to the ED with evidence of sepsis due to right foot osteomyelitis requiring medical admission.  Patient is febrile, tachycardic but hemodynamically stable.  Exam with significant soft tissue swelling extending proximally from his right foot and great toe as the likely source.  Blood work with leukocytosis, but no lactic acidosis.  Cultures were drawn and broad-spectrum antibiotics were provided.  Plain film with evidence of worsening of his osteomyelitis.  No fracture noted.  Venous ultrasound of his right leg sees no evidence of DVT or SVT.  We will admit to medicine for treatment of sepsis and osteomyelitis  Clinical Course as of 03/25/21 0022  Mon Mar 24, 2021  2325 I discuss the case with Dr. Sidney Ace, who agrees to admit [DS]    Clinical Course User Index [DS] Vladimir Crofts, MD    ____________________________________________   FINAL CLINICAL IMPRESSION(S) / ED DIAGNOSES  Final diagnoses:  Other acute osteomyelitis of right foot ([place])  Sepsis without acute organ dysfunction, due to unspecified organism [facility])     ED Discharge Orders    None       Vladimir Crofts   Note:  This document was prepared using Dragon voice recognition software and may include unintentional dictation errors.   Vladimir Crofts, MD 03/25/21 [place]uch    Vladimir Crofts, MD 03/25/21 [phone]

## 2021-03-25 ENCOUNTER — Other Ambulatory Visit: Payer: Self-pay

## 2021-03-25 ENCOUNTER — Encounter: Payer: Self-pay | Admitting: Family Medicine

## 2021-03-25 ENCOUNTER — Inpatient Hospital Stay: Payer: Medicaid Other

## 2021-03-25 DIAGNOSIS — M86171 Other acute osteomyelitis, right ankle and foot: Secondary | ICD-10-CM | POA: Diagnosis not present

## 2021-03-25 DIAGNOSIS — I5042 Chronic combined systolic (congestive) and diastolic (congestive) heart failure: Secondary | ICD-10-CM | POA: Diagnosis not present

## 2021-03-25 DIAGNOSIS — A419 Sepsis, unspecified organism: Secondary | ICD-10-CM | POA: Diagnosis not present

## 2021-03-25 LAB — CBC
HCT: 33.5 % — ABNORMAL LOW (ref 39.0–52.0)
Hemoglobin: 10.4 g/dL — ABNORMAL LOW (ref 13.0–17.0)
MCH: 26.1 pg (ref 26.0–34.0)
MCHC: 31 g/dL (ref 30.0–36.0)
MCV: 84 fL (ref 80.0–100.0)
Platelets: 415 10*3/uL — ABNORMAL HIGH (ref 150–400)
RBC: 3.99 MIL/uL — ABNORMAL LOW (ref 4.22–5.81)
RDW: 14.8 % (ref 11.5–15.5)
WBC: 17.6 10*3/uL — ABNORMAL HIGH (ref 4.0–10.5)
nRBC: 0 % (ref 0.0–0.2)

## 2021-03-25 LAB — BASIC METABOLIC PANEL
Anion gap: 5 (ref 5–15)
BUN: 8 mg/dL (ref 6–20)
CO2: 22 mmol/L (ref 22–32)
Calcium: 7.7 mg/dL — ABNORMAL LOW (ref 8.9–10.3)
Chloride: 108 mmol/L (ref 98–111)
Creatinine, Ser: 0.68 mg/dL (ref 0.61–1.24)
GFR, Estimated: >60 mL/min (ref >60)
Glucose, Bld: 157 mg/dL — ABNORMAL HIGH (ref 70–99)
Potassium: 4 mmol/L (ref 3.5–5.1)
Sodium: 135 mmol/L (ref 135–145)

## 2021-03-25 LAB — LACTIC ACID, PLASMA: Lactic Acid, Venous: 1.6 mmol/L (ref 0.5–1.9)

## 2021-03-25 LAB — GLUCOSE, CAPILLARY
Glucose-Capillary: 153 mg/dL — ABNORMAL HIGH (ref 70–99)
Glucose-Capillary: 139 mg/dL — ABNORMAL HIGH (ref 70–99)
Glucose-Capillary: 302 mg/dL — ABNORMAL HIGH (ref 70–99)
Glucose-Capillary: 344 mg/dL — ABNORMAL HIGH (ref 70–99)

## 2021-03-25 LAB — SARS CORONAVIRUS 2 (TAT 6-24 HRS): SARS Coronavirus 2: NEGATIVE

## 2021-03-25 IMAGING — MR MR [PERSON_NAME] LOW WO/W CM*R*
11 of 16 series · 27 of 40 positions shown · IV contrast (gadavist)
Comparison: None.

CLINICAL DATA: Soft tissue infection of the lower leg

EXAM:
MRI OF LOWER RIGHT EXTREMITY WITHOUT AND WITH CONTRAST
TECHNIQUE: Multiplanar, multisequence MR imaging of the right lower leg was
performed both before and after administration of intravenous
contrast.
CONTRAST:  8mL GADAVIST GADOBUTROL 1 MMOL/ML IV SOLN

[Series 4: [id number]_ · coronal · right · 4.0mm · 0.41mm/px · 3 of 34 slices shown]
[im 1/34]
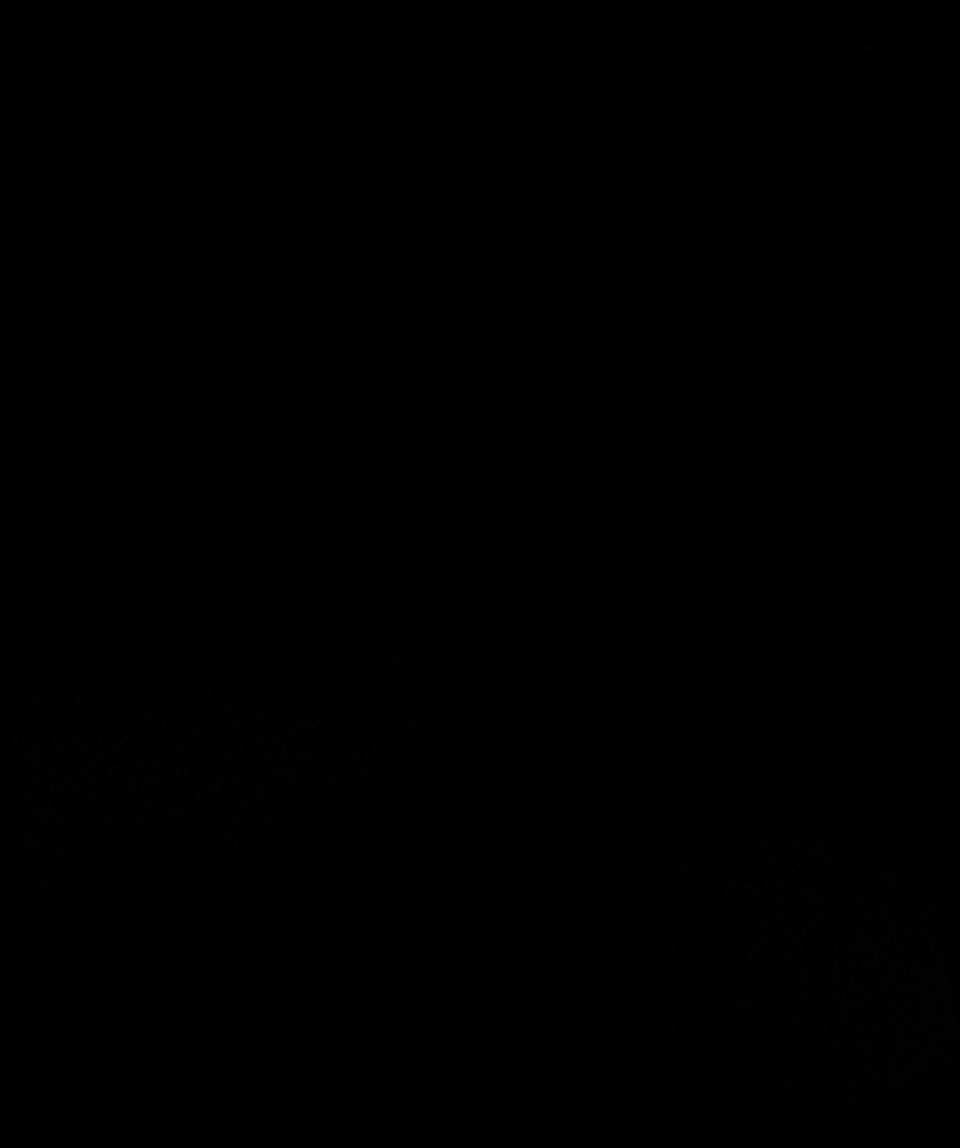
[im 17/34]
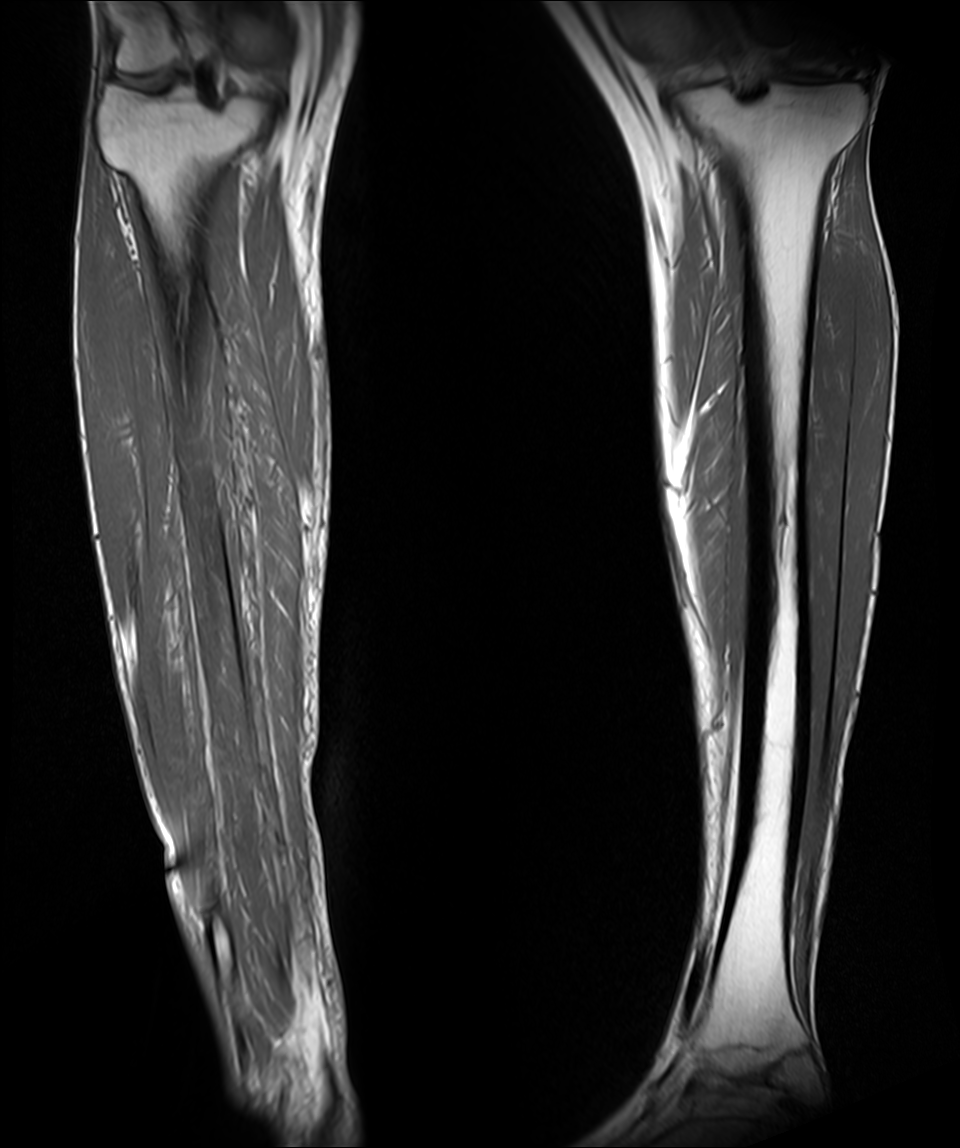
[im 34/34]
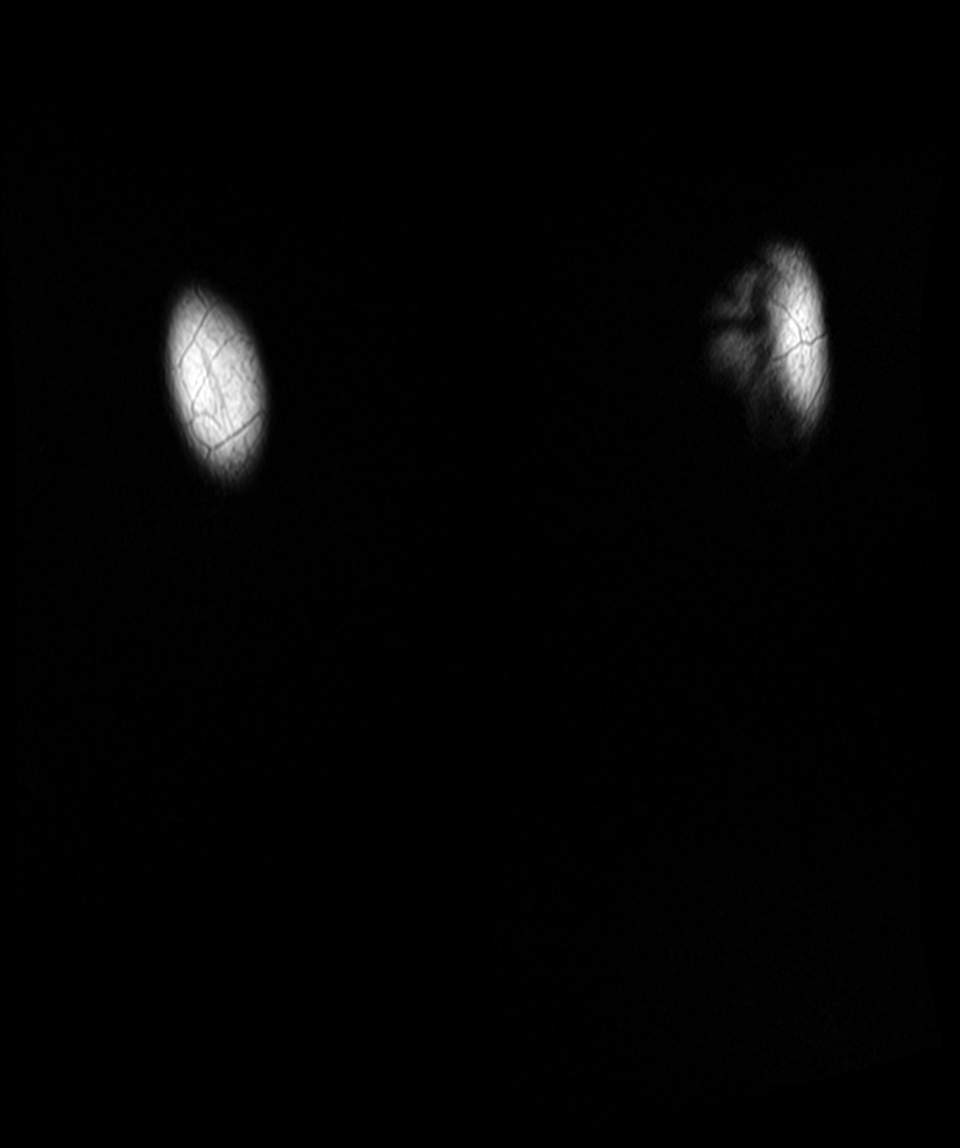

[Series 6: T1 · axial · right · 4.0mm · 0.70mm/px · z∈[-48,+177]mm · 2 of 46 slices shown (1 of 2)]
[im 1/46]
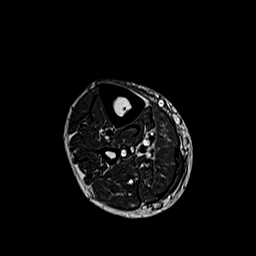
[im 46/46]
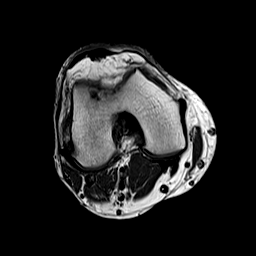

[Series 7: T1 · axial · right · 4.0mm · 0.70mm/px · z∈[-278,-53]mm · 2 of 46 slices shown (2 of 2)]
[im 1/46]
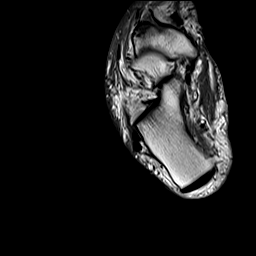
[im 46/46]
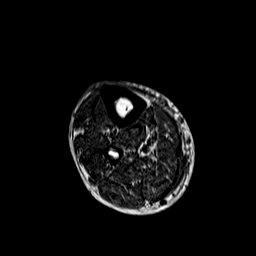

[Series 9: T2 fat-sat · axial · right · 4.0mm · 0.66mm/px · z∈[-48,+177]mm · 2 of 46 slices shown (1 of 2)]
[im 1/46]
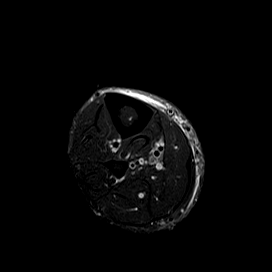
[im 46/46]
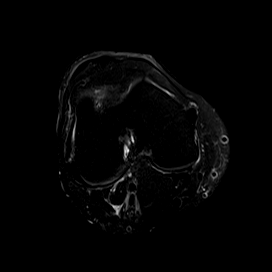

[Series 10: T2 fat-sat · axial · right · 4.0mm · 0.66mm/px · z∈[-278,-53]mm · 2 of 46 slices shown (2 of 2)]
[im 1/46]
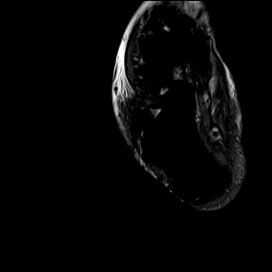
[im 46/46]
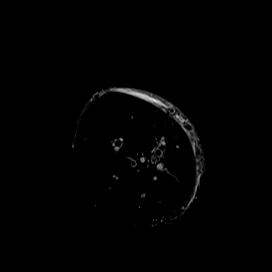

[Series 13: T1 fat-sat · axial · non-contrast · right · 4.0mm · 0.70mm/px · z∈[-48,+177]mm · 2 of 46 slices shown (1 of 4)]
[im 1/46]
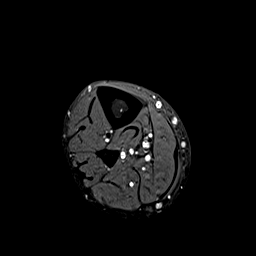
[im 46/46]
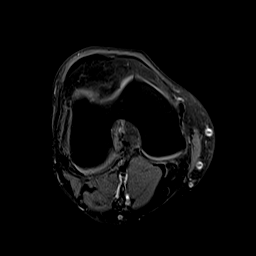

[Series 14: T1 fat-sat · axial · non-contrast · right · 4.0mm · 0.70mm/px · z∈[-278,-53]mm · 2 of 46 slices shown (2 of 4)]
[im 1/46]
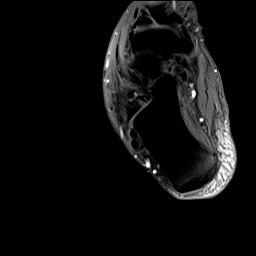
[im 46/46]
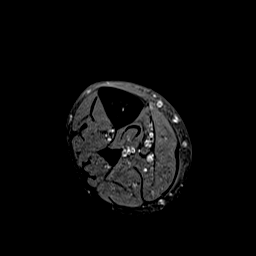

[Series 15: T1 fat-sat · axial · right · 4.0mm · 0.70mm/px · z∈[-278,+177]mm · 4 of 92 slices shown (3 of 4)]
[im 1/92]
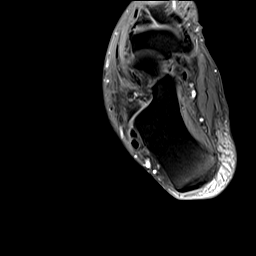
[im 31/92]
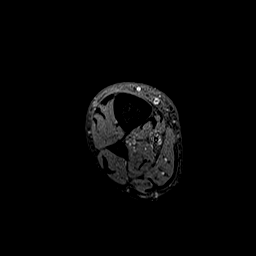
[im 61/92]
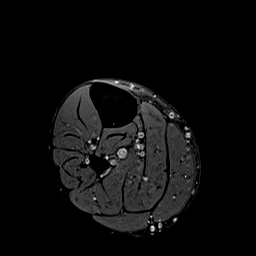
[im 92/92]
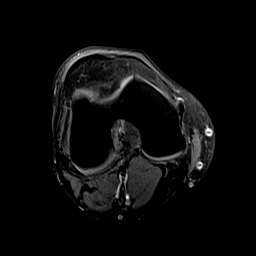

[Series 16: T1 fat-sat post-contrast · axial · right · 4.0mm · 0.70mm/px · z∈[-48,+177]mm · 2 of 46 slices shown (1 of 2)]
[im 1/46]
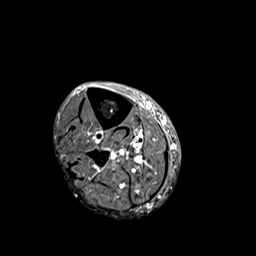
[im 46/46]
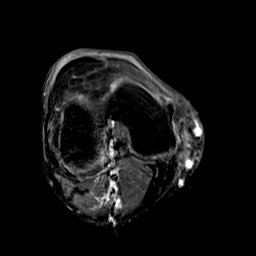

[Series 17: T1 fat-sat post-contrast · axial · right · 4.0mm · 0.70mm/px · z∈[-278,-53]mm · 2 of 46 slices shown (2 of 2)]
[im 1/46]
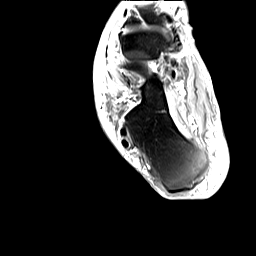
[im 46/46]
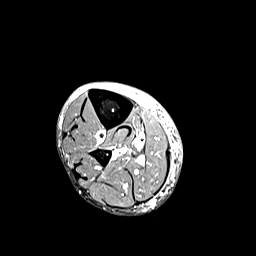

[Series 18: T1 fat-sat · axial · right · 4.0mm · 0.70mm/px · z∈[-278,+177]mm · 4 of 92 slices shown (4 of 4)]
[im 1/92]
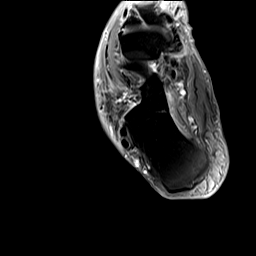
[im 31/92]
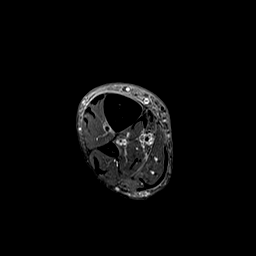
[im 61/92]
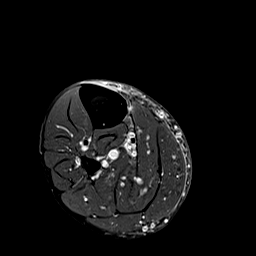
[im 92/92]
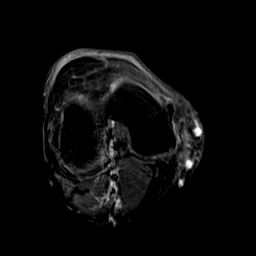

[27 of 40 positions shown; findings below may reference images not displayed]

FINDINGS: Bones/Joint/Cartilage

No marrow signal abnormality. No fracture or dislocation. Normal
alignment. No joint effusion.

Ligaments

Collateral ligaments are intact.

Muscles and Tendons
Muscles are normal. Flexor, extensor, peroneal and Achilles tendons
are intact.

Soft tissue
No fluid collection or hematoma. No soft tissue mass. Mild soft
tissue edema along the medial aspect of the right lower leg with
mild enhancement on postcontrast imaging concerning for mild
cellulitis. No drainable fluid collection to suggest an abscess.
Cystic mass along the popliteus muscle likely reflecting a ganglion
cyst measuring 1.8 cm. Small Baker's cyst.
IMPRESSION: 1. No osteomyelitis of the right lower leg.
2. Mild cellulitis of the right lower leg. No drainable fluid
collection to suggest an abscess.

## 2021-03-25 IMAGING — MR MR FOOT*R* WO/W CM
9 series · 40 of 40 positions shown · IV contrast (8ml Gadavist)
Comparison: None.

CLINICAL DATA: Type 2 diabetes.  Infection of the great toe.

EXAM:
MRI OF THE RIGHT FOREFOOT WITHOUT AND WITH CONTRAST
TECHNIQUE: Multiplanar, multisequence MR imaging of the right forefoot was
performed before and after the administration of intravenous
contrast.
CONTRAST:  8mL GADAVIST GADOBUTROL 1 MMOL/ML IV SOLN

[Series 3: T1 · coronal · right · 3.0mm · 0.38mm/px · 5 of 45 slices shown (1 of 2)]
[im 1/45]
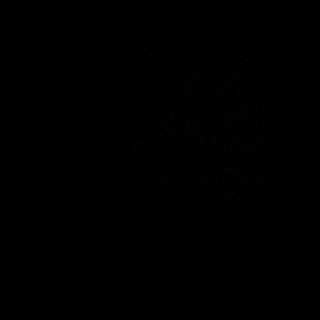
[im 12/45]
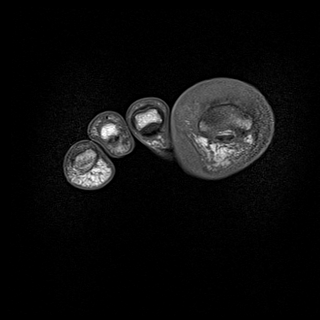
[im 23/45]
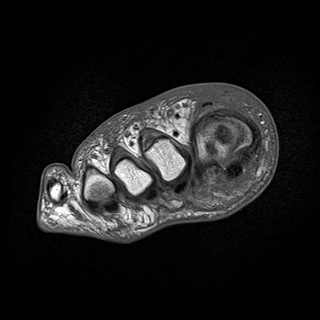
[im 34/45]
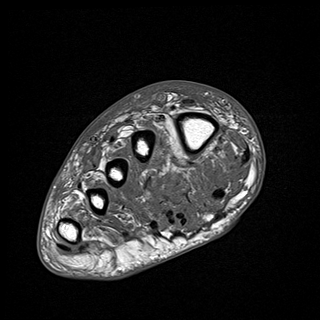
[im 45/45]
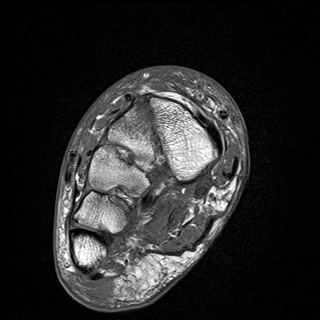

[Series 5: T2 · coronal · right · 3.0mm · 0.50mm/px · 6 of 45 slices shown (1 of 2)]
[im 1/45]
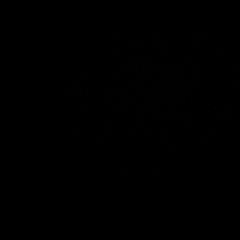
[im 9/45]
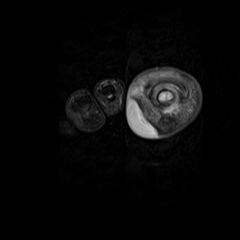
[im 18/45]
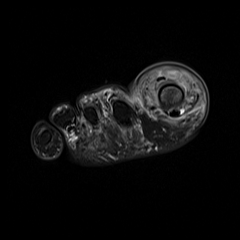
[im 27/45]
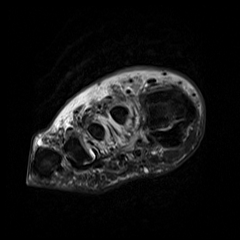
[im 36/45]
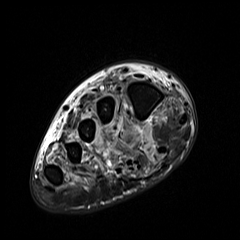
[im 45/45]
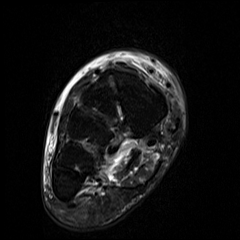

[Series 6: T1 · oblique · right · 3.0mm · 0.70mm/px · 3 of 22 slices shown (2 of 2)]
[im 1/22]
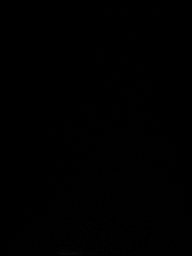
[im 11/22]
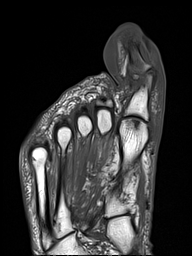
[im 22/22]
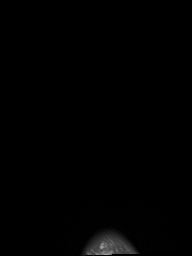

[Series 8: T2 · oblique · right · 3.0mm · 0.70mm/px · 3 of 22 slices shown (2 of 2)]
[im 1/22]
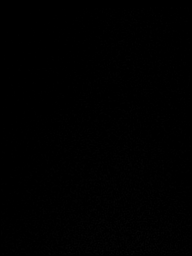
[im 11/22]
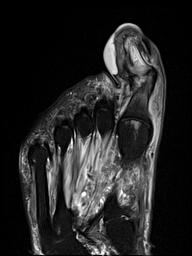
[im 22/22]
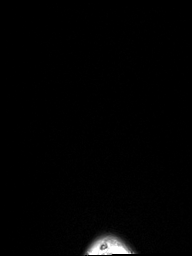

[Series 9: STIR · sagittal · right · 3.0mm · 0.62mm/px · 4 of 29 slices shown]
[im 1/29]
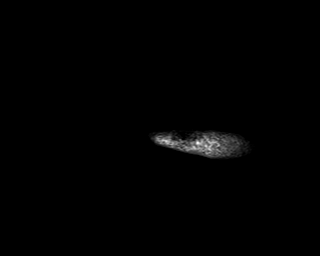
[im 10/29]
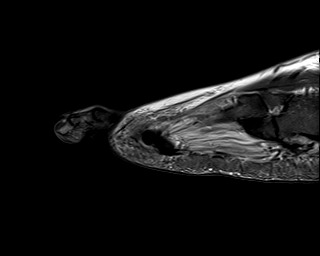
[im 19/29]
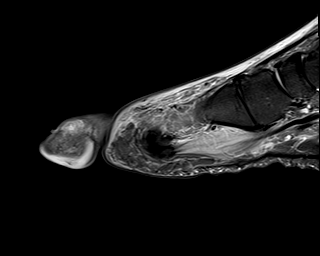
[im 29/29]
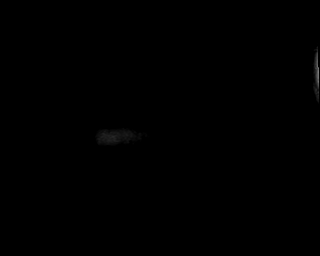

[Series 10: T1 fat-sat · coronal · non-contrast · right · 3.0mm · 0.47mm/px · 6 of 45 slices shown (1 of 3)]
[im 1/45]
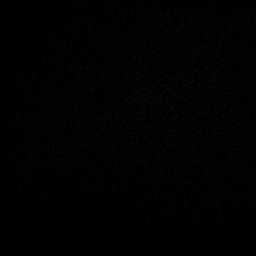
[im 9/45]
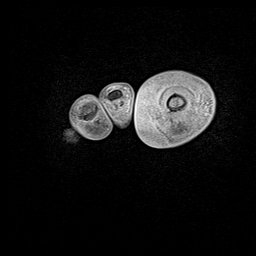
[im 18/45]
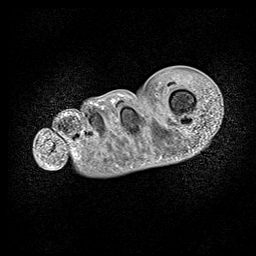
[im 27/45]
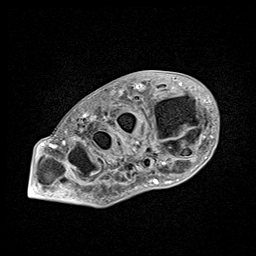
[im 36/45]
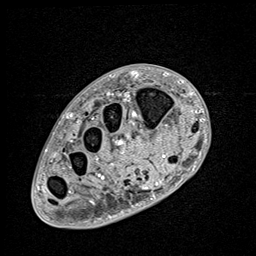
[im 45/45]
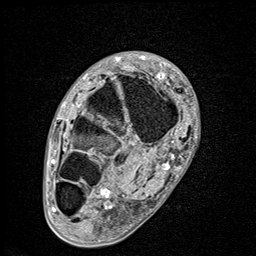

[Series 11: T1 fat-sat post-contrast · coronal · right · 3.0mm · 0.47mm/px · 6 of 45 slices shown]
[im 1/45]
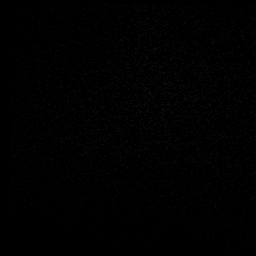
[im 9/45]
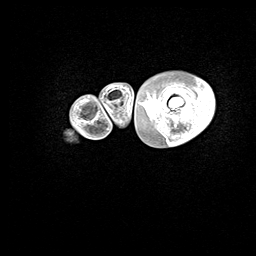
[im 18/45]
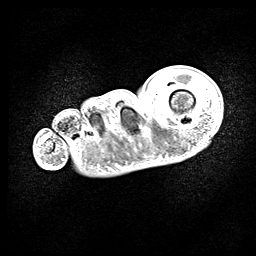
[im 27/45]
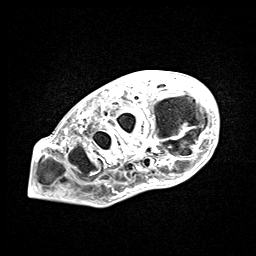
[im 36/45]
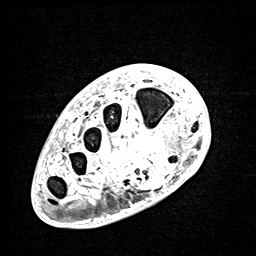
[im 45/45]
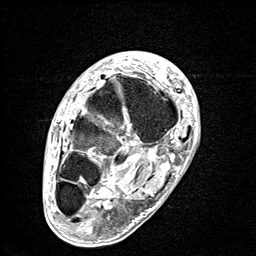

[Series 12: T1 fat-sat · sagittal · right · 3.0mm · 0.62mm/px · 4 of 29 slices shown (2 of 3)]
[im 1/29]
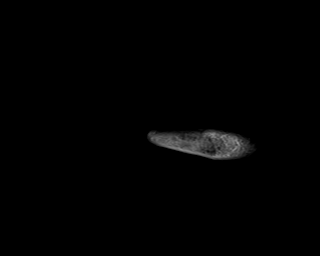
[im 10/29]
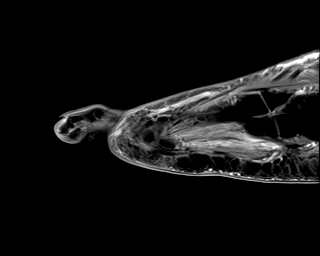
[im 19/29]
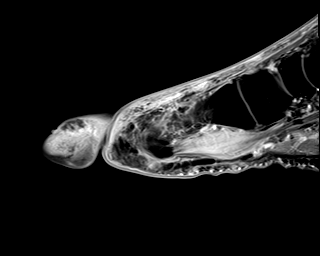
[im 29/29]
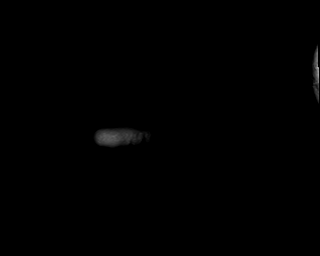

[Series 13: T1 fat-sat · oblique · right · 3.0mm · 0.56mm/px · 3 of 21 slices shown (3 of 3)]
[im 1/21]
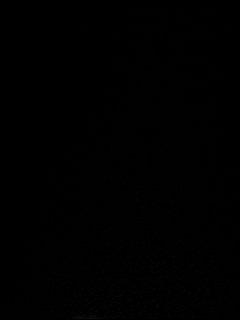
[im 11/21]
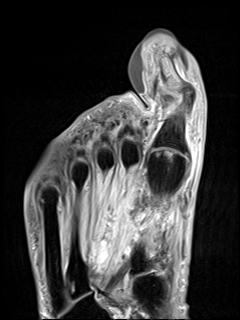
[im 21/21]
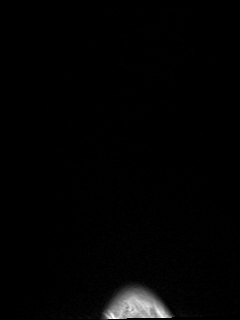

[40 of 40 positions shown; findings below may reference images not displayed]

FINDINGS: Bones/Joint/Cartilage

Severe soft tissue edema of the great toe extending into the more
proximal medial forefoot with enhancement most consistent with
cellulitis. Complex fluid collection along the dorsal aspect of the
great toe measuring 1.8 x 0.4 x 2.6 cm consistent with an abscess.
Large skin blister along the medial aspect of the great toe. Severe
bone marrow edema in the first distal phalanx and head of the first
proximal phalanx most concerning for osteomyelitis.

Mild osteoarthritis of the first MTP joint with subchondral reactive
marrow changes. No acute fracture or dislocation. Normal alignment.
No joint effusion.

Ligaments

Collateral ligaments are intact.  Lisfranc ligament is intact.

Muscles and Tendons
Flexor, peroneal and extensor compartment tendons are intact. Mild
edema in the plantar musculature likely neurogenic.

Soft tissue
No fluid collection or hematoma.  No soft tissue mass.
IMPRESSION: 1. Severe bone marrow edema in the first distal phalanx and head of
the first proximal phalanx most concerning for osteomyelitis. Severe
cellulitis of the great toe extending into the more proximal medial
forefoot. Complex fluid collection along the dorsal aspect of the
great toe measuring 1.8 x 0.4 x 2.6 cm consistent with an abscess.

## 2021-03-25 MED ORDER — INSULIN GLARGINE 100 UNIT/ML ~~LOC~~ SOLN
40.0000 [IU] | Freq: Every day | SUBCUTANEOUS | Status: DC
  Administered 2021-03-26: 40 [IU] via SUBCUTANEOUS
  Filled 2021-03-25 (×3): qty 0.4

## 2021-03-25 MED ORDER — ENOXAPARIN SODIUM 40 MG/0.4ML ~~LOC~~ SOLN
40.0000 mg | SUBCUTANEOUS | Status: DC
  Administered 2021-03-25 – 2021-04-04 (×10): 40 mg via SUBCUTANEOUS
  Filled 2021-03-25 (×10): qty 0.4

## 2021-03-25 MED ORDER — METRONIDAZOLE 500 MG PO TABS
500.0000 mg | ORAL_TABLET | Freq: Three times a day (TID) | ORAL | Status: DC
  Administered 2021-03-25 – 2021-04-05 (×30): 500 mg via ORAL
  Filled 2021-03-25 (×31): qty 1

## 2021-03-25 MED ORDER — INSULIN ASPART 100 UNIT/ML ~~LOC~~ SOLN
0.0000 [IU] | Freq: Three times a day (TID) | SUBCUTANEOUS | Status: DC
  Administered 2021-03-25: 11 [IU] via SUBCUTANEOUS
  Administered 2021-03-26 (×2): 5 [IU] via SUBCUTANEOUS
  Administered 2021-03-26 – 2021-03-27 (×2): 3 [IU] via SUBCUTANEOUS
  Administered 2021-03-27: 5 [IU] via SUBCUTANEOUS
  Administered 2021-03-27: 2 [IU] via SUBCUTANEOUS
  Administered 2021-03-28 (×2): 3 [IU] via SUBCUTANEOUS
  Administered 2021-03-29: 2 [IU] via SUBCUTANEOUS
  Administered 2021-03-29: 5 [IU] via SUBCUTANEOUS
  Administered 2021-03-29: 2 [IU] via SUBCUTANEOUS
  Administered 2021-03-30: 13 [IU] via SUBCUTANEOUS
  Administered 2021-03-30: 3 [IU] via SUBCUTANEOUS
  Administered 2021-03-30 – 2021-03-31 (×2): 2 [IU] via SUBCUTANEOUS
  Administered 2021-03-31: 3 [IU] via SUBCUTANEOUS
  Administered 2021-03-31: 2 [IU] via SUBCUTANEOUS
  Administered 2021-03-31 – 2021-04-02 (×3): 3 [IU] via SUBCUTANEOUS
  Administered 2021-04-02: 5 [IU] via SUBCUTANEOUS
  Administered 2021-04-02 – 2021-04-03 (×2): 3 [IU] via SUBCUTANEOUS
  Administered 2021-04-03: 2 [IU] via SUBCUTANEOUS
  Administered 2021-04-03: 8 [IU] via SUBCUTANEOUS
  Administered 2021-04-03: 2 [IU] via SUBCUTANEOUS
  Administered 2021-04-04 (×2): 3 [IU] via SUBCUTANEOUS
  Administered 2021-04-05: 5 [IU] via SUBCUTANEOUS
  Administered 2021-04-05: 3 [IU] via SUBCUTANEOUS
  Filled 2021-03-25 (×30): qty 1

## 2021-03-25 MED ORDER — SODIUM CHLORIDE 0.9 % IV SOLN
2.0000 g | Freq: Three times a day (TID) | INTRAVENOUS | Status: DC
  Administered 2021-03-25 – 2021-03-30 (×16): 2 g via INTRAVENOUS
  Filled 2021-03-25 (×19): qty 2

## 2021-03-25 MED ORDER — VANCOMYCIN HCL 1250 MG/250ML IV SOLN
1250.0000 mg | Freq: Two times a day (BID) | INTRAVENOUS | Status: DC
  Administered 2021-03-25 – 2021-03-30 (×11): 1250 mg via INTRAVENOUS
  Filled 2021-03-25 (×12): qty 250

## 2021-03-25 MED ORDER — GADOBUTROL 1 MMOL/ML IV SOLN
8.0000 mL | Freq: Once | INTRAVENOUS | Status: AC | PRN
  Administered 2021-03-25: 8 mL via INTRAVENOUS

## 2021-03-25 NOTE — Consult Note (Addendum)
Reason for Consult: Osteomyelitis right hallux. Referring Physician: Mansy  Kastiel Joshua [name] is an 50 y.o. male.  HPI: This is a 50 year old male who has had a history of osteomyelitis in his right great toe for at least the last month.  Previously has been hospitalized on 2 different occasions.  Recommendations were made for amputation of the toe but he refused.  Recently has started to have some increased pain extending all the way up into his right leg and thigh and recently has had some fever and chills as well as nausea and vomiting with increased difficulty walking on his right leg.  Presented to the emergency department where he was admitted for osteomyelitis and sepsis.  Past Medical History:  Diagnosis Date  . CHF (congestive heart failure) ([place])   . Diabetes mellitus without complication ([place])   . Hypertension     Past Surgical History:  Procedure Laterality Date  . CORONARY/GRAFT ACUTE MI REVASCULARIZATION N/A 09/09/2018   Procedure: Coronary/Graft Acute MI Revascularization;  Surgeon: Yolonda Kida, MD;  Location: [facility];  Service: Cardiovascular;  Laterality: N/A;  . LEFT HEART CATH AND CORONARY ANGIOGRAPHY N/A 09/09/2018   Procedure: LEFT HEART CATH AND CORONARY ANGIOGRAPHY;  Surgeon: Yolonda Kida, MD;  Location: [facility];  Service: Cardiovascular;  Laterality: N/A;  . LOWER EXTREMITY ANGIOGRAPHY Right 02/14/2021   Procedure: Lower Extremity Angiography;  Surgeon: Katha Cabal, MD;  Location: [facility] [facility];  Service: Cardiovascular;  Laterality: Right;  . none    . STENT PLACEMENT VASCULAR ([facility] HX)      Family History  Problem Relation Age of Onset  . Cancer Mother   . CAD Brother     Social History:  reports that he has been smoking. He has never used smokeless tobacco. He reports current alcohol use. He reports previous drug use.  Allergies: No Known Allergies  Medications:  Scheduled: . ascorbic acid  250 mg  Oral BID  . carvedilol  3.125 mg Oral BID WC  . dapagliflozin propanediol  5 mg Oral Daily  . fluticasone  2 spray Each Nare Daily  . gabapentin  100 mg Oral QHS  . insulin glargine  40 Units Subcutaneous Daily  . losartan  25 mg Oral Daily  . multivitamin with minerals  1 tablet Oral Daily  . spironolactone  25 mg Oral Daily    Results for orders placed or performed during the hospital encounter of 03/24/21 (from the past 48 hour(s))  CBC with Differential     Status: Abnormal   Collection Time: 03/24/21  9:44 PM  Result Value Ref Range   WBC 18.9 (H) 4.0 - 10.5 K/uL   RBC 3.96 (L) 4.22 - 5.81 MIL/uL   Hemoglobin 10.4 (L) 13.0 - 17.0 g/dL   HCT 33.1 (L) 39.0 - 52.0 %   MCV 83.6 80.0 - 100.0 fL   MCH 26.3 26.0 - 34.0 pg   MCHC 31.4 30.0 - 36.0 g/dL   RDW 14.7 11.5 - 15.5 %   Platelets 424 (H) 150 - 400 K/uL   nRBC 0.0 0.0 - 0.2 %   Neutrophils Relative % 80 %   Neutro Abs 15.2 (H) 1.7 - 7.7 K/uL   Lymphocytes Relative 11 %   Lymphs Abs 2.0 0.7 - 4.0 K/uL   Monocytes Relative 9 %   Monocytes Absolute 1.6 (H) 0.1 - 1.0 K/uL   Eosinophils Relative 0 %   Eosinophils Absolute 0.1 0.0 - 0.5 K/uL  Basophils Relative 0 %   Basophils Absolute 0.1 0.0 - 0.1 K/uL   Immature Granulocytes 0 %   Abs Immature Granulocytes 0.07 0.00 - 0.07 K/uL    Comment: Performed at [facility], [address] [place], [place] [address]  Comprehensive metabolic panel     Status: Abnormal   Collection Time: 03/24/21  9:44 PM  Result Value Ref Range   Sodium 133 (L) 135 - 145 mmol/L   Potassium 4.2 3.5 - 5.1 mmol/L   Chloride 100 98 - 111 mmol/L   CO2 26 22 - 32 mmol/L   Glucose, Bld 190 (H) 70 - 99 mg/dL    Comment: Glucose reference range applies only to samples taken after fasting for at least 8 hours.   BUN 8 6 - 20 mg/dL   Creatinine, Ser 0.91 0.61 - 1.24 mg/dL   Calcium 8.4 (L) 8.9 - 10.3 mg/dL   Total Protein 7.2 6.5 - 8.1 g/dL   Albumin 3.3 (L) 3.5 - 5.0 g/dL   AST 15 15 - 41  U/L   ALT 10 0 - 44 U/L   Alkaline Phosphatase 63 38 - 126 U/L   Total Bilirubin 0.8 0.3 - 1.2 mg/dL   GFR, Estimated >60 >60 mL/min    Comment: (NOTE) Calculated using the CKD-EPI Creatinine Equation (2021)    Anion gap 7 5 - 15    Comment: Performed at [facility], [address] [place], [place] [address]  Lactic acid, plasma     Status: None   Collection Time: 03/24/21 10:34 PM  Result Value Ref Range   Lactic Acid, Venous 1.1 0.5 - 1.9 mmol/L    Comment: Performed at [facility], [address] [place], [place] [address]  Blood culture (routine x 2)     Status: None (Preliminary result)   Collection Time: 03/24/21 10:34 PM   Specimen: BLOOD  Result Value Ref Range   Specimen Description BLOOD BLOOD LEFT FOREARM    Special Requests      BOTTLES DRAWN AEROBIC AND ANAEROBIC Blood Culture results may not be optimal due to an excessive volume of blood received in culture bottles   Culture      NO GROWTH < 12 HOURS Performed at [facility], [address] [place], [place] [address]    Report Status PENDING   Blood culture (routine x 2)     Status: None (Preliminary result)   Collection Time: 03/24/21 10:34 PM   Specimen: BLOOD  Result Value Ref Range   Specimen Description BLOOD LEFT ANTECUBITAL    Special Requests      BOTTLES DRAWN AEROBIC AND ANAEROBIC Blood Culture results may not be optimal due to an excessive volume of blood received in culture bottles   Culture      NO GROWTH < 12 HOURS Performed at [facility], [place], [place] [address]    Report Status PENDING   SARS CORONAVIRUS 2 (TAT 6-24 HRS) Nasopharyngeal Nasopharyngeal Swab     Status: None   Collection Time: 03/24/21 10:39 PM   Specimen: Nasopharyngeal Swab  Result Value Ref Range   SARS Coronavirus 2 NEGATIVE NEGATIVE    Comment: (NOTE) SARS-CoV-2 target nucleic acids are NOT DETECTED.  The SARS-CoV-2 RNA is generally detectable in upper and  lower respiratory specimens during the acute phase of infection. Negative results do not preclude SARS-CoV-2 infection, do not rule out co-infections with other pathogens, and should not be used as the  sole basis for treatment or other patient management decisions. Negative results must be combined with clinical observations, patient history, and epidemiological information. The expected result is Negative.  Fact Sheet for Patients: SugarRoll.be  Fact Sheet for Healthcare Providers: https://www.woods-mathews.com/  This test is not yet approved or cleared by the [place] FDA and  has been authorized for detection and/or diagnosis of SARS-CoV-2 by FDA under an Emergency Use Authorization (EUA). This EUA will remain  in effect (meaning this test can be used) for the duration of the COVID-19 declaration under Se ction 564(b)(1) of the Act, 21 U.S.C. section 360bbb-3(b)(1), unless the authorization is terminated or revoked sooner.  Performed at [facility], [place] [address], [place], [place] [address]   Lactic acid, plasma     Status: None   Collection Time: 03/25/21 12:13 AM  Result Value Ref Range   Lactic Acid, Venous 1.6 0.5 - 1.9 mmol/L    Comment: Performed at [facility], [address] [place], [place] [address]  Basic metabolic panel     Status: Abnormal   Collection Time: 03/25/21  4:49 AM  Result Value Ref Range   Sodium 135 135 - 145 mmol/L   Potassium 4.0 3.5 - 5.1 mmol/L   Chloride 108 98 - 111 mmol/L   CO2 22 22 - 32 mmol/L   Glucose, Bld 157 (H) 70 - 99 mg/dL    Comment: Glucose reference range applies only to samples taken after fasting for at least 8 hours.   BUN 8 6 - 20 mg/dL   Creatinine, Ser 0.68 0.61 - 1.24 mg/dL   Calcium 7.7 (L) 8.9 - 10.3 mg/dL   GFR, Estimated >60 >60 mL/min    Comment: (NOTE) Calculated using the CKD-EPI Creatinine Equation (2021)    Anion gap 5 5 - 15    Comment:  Performed at [facility], [address] [place], [place] [address]  CBC     Status: Abnormal   Collection Time: 03/25/21  4:49 AM  Result Value Ref Range   WBC 17.6 (H) 4.0 - 10.5 K/uL   RBC 3.99 (L) 4.22 - 5.81 MIL/uL   Hemoglobin 10.4 (L) 13.0 - 17.0 g/dL   HCT 33.5 (L) 39.0 - 52.0 %   MCV 84.0 80.0 - 100.0 fL   MCH 26.1 26.0 - 34.0 pg   MCHC 31.0 30.0 - 36.0 g/dL   RDW 14.8 11.5 - 15.5 %   Platelets 415 (H) 150 - 400 K/uL   nRBC 0.0 0.0 - 0.2 %    Comment: Performed at [facility], [address] [place], [place] [address]  Glucose, capillary     Status: Abnormal   Collection Time: 03/25/21  8:29 AM  Result Value Ref Range   Glucose-Capillary 153 (H) 70 - 99 mg/dL    Comment: Glucose reference range applies only to samples taken after fasting for at least 8 hours.  Glucose, capillary     Status: Abnormal   Collection Time: 03/25/21 11:44 AM  Result Value Ref Range   Glucose-Capillary 139 (H) 70 - 99 mg/dL    Comment: Glucose reference range applies only to samples taken after fasting for at least 8 hours.    MR FOOT RIGHT W WO CONTRAST  Result Date: 03/25/2021 CLINICAL DATA:  Type 2 diabetes.  Infection of the great toe. EXAM: MRI OF THE RIGHT FOREFOOT WITHOUT AND WITH CONTRAST TECHNIQUE: Multiplanar, multisequence MR imaging of the right forefoot was performed before and after  the administration of intravenous contrast. CONTRAST:  96mL GADAVIST GADOBUTROL 1 MMOL/ML IV SOLN COMPARISON:  None. FINDINGS: Bones/Joint/Cartilage Severe soft tissue edema of the great toe extending into the more proximal medial forefoot with enhancement most consistent with cellulitis. Complex fluid collection along the dorsal aspect of the great toe measuring 1.8 x 0.4 x 2.6 cm consistent with an abscess. Large skin blister along the medial aspect of the great toe. Severe bone marrow edema in the first distal phalanx and head of the first proximal phalanx most concerning for  osteomyelitis. Mild osteoarthritis of the first MTP joint with subchondral reactive marrow changes. No acute fracture or dislocation. Normal alignment. No joint effusion. Ligaments Collateral ligaments are intact.  Lisfranc ligament is intact. Muscles and Tendons Flexor, peroneal and extensor compartment tendons are intact. Mild edema in the plantar musculature likely neurogenic. Soft tissue No fluid collection or hematoma.  No soft tissue mass. IMPRESSION: 1. Severe bone marrow edema in the first distal phalanx and head of the first proximal phalanx most concerning for osteomyelitis. Severe cellulitis of the great toe extending into the more proximal medial forefoot. Complex fluid collection along the dorsal aspect of the great toe measuring 1.8 x 0.4 x 2.6 cm consistent with an abscess. Electronically Signed   By: Kathreen Devoid   On: 03/25/2021 06:24   MR TIBIA FIBULA RIGHT W WO CONTRAST  Result Date: 03/25/2021 CLINICAL DATA:  Soft tissue infection of the lower leg EXAM: MRI OF LOWER RIGHT EXTREMITY WITHOUT AND WITH CONTRAST TECHNIQUE: Multiplanar, multisequence MR imaging of the right lower leg was performed both before and after administration of intravenous contrast. CONTRAST:  34mL GADAVIST GADOBUTROL 1 MMOL/ML IV SOLN COMPARISON:  None. FINDINGS: Bones/Joint/Cartilage No marrow signal abnormality. No fracture or dislocation. Normal alignment. No joint effusion. Ligaments Collateral ligaments are intact. Muscles and Tendons Muscles are normal. Flexor, extensor, peroneal and Achilles tendons are intact. Soft tissue No fluid collection or hematoma. No soft tissue mass. Mild soft tissue edema along the medial aspect of the right lower leg with mild enhancement on postcontrast imaging concerning for mild cellulitis. No drainable fluid collection to suggest an abscess. Cystic mass along the popliteus muscle likely reflecting a ganglion cyst measuring 1.8 cm. Small Baker's cyst. IMPRESSION: 1. No osteomyelitis of  the right lower leg. 2. Mild cellulitis of the right lower leg. No drainable fluid collection to suggest an abscess. Electronically Signed   By: Kathreen Devoid   On: 03/25/2021 06:36   US Venous Img Lower Unilateral Right  Result Date: 03/24/2021 CLINICAL DATA:  Right leg pain, palpable cord around femoral vein EXAM: RIGHT LOWER EXTREMITY VENOUS DOPPLER ULTRASOUND TECHNIQUE: Gray-scale sonography with compression, as well as color and duplex ultrasound, were performed to evaluate the deep venous system(s) from the level of the common femoral vein through the popliteal and proximal calf veins. COMPARISON:  03/11/2021 FINDINGS: VENOUS Normal compressibility of the common femoral, superficial femoral, and popliteal veins, as well as the visualized calf veins. Visualized portions of profunda femoral vein and great saphenous vein unremarkable. No filling defects to suggest DVT on grayscale or color Doppler imaging. Doppler waveforms show normal direction of venous flow, normal respiratory plasticity and response to augmentation. Limited views of the contralateral common femoral vein are unremarkable. OTHER None. Limitations: none IMPRESSION: 1. No evidence of deep venous thrombosis within the right lower extremity. Electronically Signed   By: Joshua Ngo M.D.   On: 03/24/2021 23:50   DG Foot Complete Right  Result Date:  03/24/2021 CLINICAL DATA:  Great toe osteomyelitis EXAM: RIGHT FOOT COMPLETE - 3+ VIEW COMPARISON:  03/11/2021 FINDINGS: Interval increase in bony erosion of the tuft of the distal phalanx right great toe. No other bony abnormality. No fracture. Joint spaces are preserved. Soft tissue edema about the great toe. IMPRESSION: 1. Interval increase in bony erosion of the tuft of the distal phalanx right great toe, consistent with osteomyelitis. 2. Soft tissue edema about the great toe. Electronically Signed   By: Joshua Candle M.D.   On: 03/24/2021 22:54    Review of Systems  Constitutional:  Positive for chills and fever.  HENT: Negative for congestion and sinus pain.   Respiratory: Negative for cough and shortness of breath.   Cardiovascular: Negative for chest pain and palpitations.  Gastrointestinal: Positive for nausea and vomiting.  Endocrine: Negative for polydipsia and polyuria.  Genitourinary: Negative for frequency and urgency.  Musculoskeletal:       Patient relates some increased pain in his right leg recently causing more difficulty when walking.  Skin:       Patient relates some increased swelling and redness in his right foot.  Does not relate any drainage from his right great toe.  Neurological:       Patient does relate neuropathy associated with his diabetes.   Blood pressure 105/73, pulse 82, temperature 98.6 F (37 C), temperature source Oral, resp. rate 19, height 5\' 10"  (1.778 m), weight 82.5 kg, SpO2 100 %. Physical Exam Cardiovascular:     Comments: DP and PT pulses are fully palpable. Musculoskeletal:     Comments: Adequate range of motion of the pedal joints.  Some pain extending into the right leg.  Muscle testing deferred.  Skin:    Comments: Significant erythema and edema in the right great toe extending into the forefoot.  A palpable fluid abscess is noted along the dorsum of the right foot.  No open ulceration or drainage noted.  Neurological:     Comments: Loss of sensation distally in the forefoot and digits.     Assessment/Plan: Assessment: 1.  Osteomyelitis right hallux. 2.  Large abscess right hallux. 3.  Diabetes with associated neuropathy.  Plan: Discussed with the patient the need for amputation of his right great toe due to the extensive infection in the distal phalanx and now in his proximal phalanx of the great toe.  Discussed that he does have a large abscess on the top of his toe.  Discussed with the patient at this point that he would need amputation of his entire right great toe.  At this point in the conversation patient  became somewhat combative about his past treatment.  States he feels like nothing has been done for his toe.  Did not take antibiotics after his admission in early March and states he was placed on the wrong antibiotics that he was allergic to at his admission at the end of March.  States that initially he was told only the tip of the toe would need to come off but now is concerned because the entire toe would have to come off.  Explained to the patient that prior amputation was recommended by Dr. Vickki Muff at his first admission as well as Dr. Luana Shu at his second admission at which time possibly just a portion of the toe could have been taken off.  Discussed that the infection has spread into the proximal phalanx and along the dorsum of the toe.  Patient states that he is refusing to have  his toe amputated and states that he wants another doctor, in fact he would like to go to a different hospital.  Discussed with the patient that if he prolongs his amputation of the right great toe he is at a much higher risk for having to have more of the foot amputated or possibly ending up with a more proximal amputation.  He voiced understanding of this but again was adamant that he has not having his toe taken off here at this hospital and with our doctors.  Discussed with the patient that we could look into trying to get him transferred or he could leave AMA and could seek treatment elsewhere.  We discussed [facility], [facility], and [facility].  At this point podiatry will sign off as the patient is refusing any further treatment.  [name] 03/25/2021, 1:33 PM

## 2021-03-25 NOTE — Progress Notes (Signed)
[facility] NAME: Joshua [name]    MR#:  [id number]  DATE OF BIRTH:  [birth year]  SUBJECTIVE:   Came in with pain in the right foot and ongoing swelling with foul-smelling discharge. Frustrated because appears to be hungry. REVIEW OF SYSTEMS:   Review of Systems  Constitutional: Negative for chills, fever and weight loss.  HENT: Negative for ear discharge, ear pain and nosebleeds.   Eyes: Negative for blurred vision, pain and discharge.  Respiratory: Negative for sputum production, shortness of breath, wheezing and stridor.   Cardiovascular: Negative for chest pain, palpitations, orthopnea and PND.  Gastrointestinal: Negative for abdominal pain, diarrhea, nausea and vomiting.  Genitourinary: Negative for frequency and urgency.  Musculoskeletal: Negative for back pain and joint pain.  Neurological: Negative for sensory change, speech change, focal weakness and weakness.  Psychiatric/Behavioral: Negative for depression and hallucinations. The patient is not nervous/anxious.    Tolerating Diet:yes Tolerating PT:   DRUG ALLERGIES:  No Known Allergies  VITALS:  Blood pressure 105/73, pulse 82, temperature 98.6 F (37 C), temperature source Oral, resp. rate 19, height 5\' 10"  (1.778 m), weight 82.5 kg, SpO2 100 %.  PHYSICAL EXAMINATION:   Physical Exam  GENERAL:  50 y.o.-year-old patient lying in the bed with no acute distress.  LUNGS: Normal breath sounds bilaterally, no wheezing, rales, rhonchi. No use of accessory muscles of respiration.  CARDIOVASCULAR: S1, S2 normal. No murmurs, rubs, or gallops.  ABDOMEN: Soft, nontender, nondistended. Bowel sounds present. No organomegaly or mass.  EXTREMITIES:   NEUROLOGIC: non focal PSYCHIATRIC:  patient is alert and oriented x 3.  SKIN: as above  LABORATORY PANEL:  CBC Recent Labs  Lab 03/25/21 0449  WBC 17.6*  HGB 10.4*  HCT 33.5*  PLT 415*    Chemistries  Recent Labs   Lab 03/24/21 2144 03/25/21 0449  NA 133* 135  K 4.2 4.0  CL 100 108  CO2 26 22  GLUCOSE 190* 157*  BUN 8 8  CREATININE 0.91 0.68  CALCIUM 8.4* 7.7*  AST 15  --   ALT 10  --   ALKPHOS 63  --   BILITOT 0.8  --    Cardiac Enzymes No results for input(s): TROPONINI in the last 168 hours. RADIOLOGY:  MR FOOT RIGHT W WO CONTRAST  Result Date: 03/25/2021 CLINICAL DATA:  Type 2 diabetes.  Infection of the great toe. EXAM: MRI OF THE RIGHT FOREFOOT WITHOUT AND WITH CONTRAST TECHNIQUE: Multiplanar, multisequence MR imaging of the right forefoot was performed before and after the administration of intravenous contrast. CONTRAST:  75mL GADAVIST GADOBUTROL 1 MMOL/ML IV SOLN COMPARISON:  None. FINDINGS: Bones/Joint/Cartilage Severe soft tissue edema of the great toe extending into the more proximal medial forefoot with enhancement most consistent with cellulitis. Complex fluid collection along the dorsal aspect of the great toe measuring 1.8 x 0.4 x 2.6 cm consistent with an abscess. Large skin blister along the medial aspect of the great toe. Severe bone marrow edema in the first distal phalanx and head of the first proximal phalanx most concerning for osteomyelitis. Mild osteoarthritis of the first MTP joint with subchondral reactive marrow changes. No acute fracture or dislocation. Normal alignment. No joint effusion. Ligaments Collateral ligaments are intact.  Lisfranc ligament is intact. Muscles and Tendons Flexor, peroneal and extensor compartment tendons are intact. Mild edema in the plantar musculature likely neurogenic. Soft tissue No fluid collection or hematoma.  No soft tissue mass. IMPRESSION: 1. Severe  bone marrow edema in the first distal phalanx and head of the first proximal phalanx most concerning for osteomyelitis. Severe cellulitis of the great toe extending into the more proximal medial forefoot. Complex fluid collection along the dorsal aspect of the great toe measuring 1.8 x 0.4 x 2.6  cm consistent with an abscess. Electronically Signed   By: Kathreen Devoid   On: 03/25/2021 06:24   MR TIBIA FIBULA RIGHT W WO CONTRAST  Result Date: 03/25/2021 CLINICAL DATA:  Soft tissue infection of the lower leg EXAM: MRI OF LOWER RIGHT EXTREMITY WITHOUT AND WITH CONTRAST TECHNIQUE: Multiplanar, multisequence MR imaging of the right lower leg was performed both before and after administration of intravenous contrast. CONTRAST:  73mL GADAVIST GADOBUTROL 1 MMOL/ML IV SOLN COMPARISON:  None. FINDINGS: Bones/Joint/Cartilage No marrow signal abnormality. No fracture or dislocation. Normal alignment. No joint effusion. Ligaments Collateral ligaments are intact. Muscles and Tendons Muscles are normal. Flexor, extensor, peroneal and Achilles tendons are intact. Soft tissue No fluid collection or hematoma. No soft tissue mass. Mild soft tissue edema along the medial aspect of the right lower leg with mild enhancement on postcontrast imaging concerning for mild cellulitis. No drainable fluid collection to suggest an abscess. Cystic mass along the popliteus muscle likely reflecting a ganglion cyst measuring 1.8 cm. Small Baker's cyst. IMPRESSION: 1. No osteomyelitis of the right lower leg. 2. Mild cellulitis of the right lower leg. No drainable fluid collection to suggest an abscess. Electronically Signed   By: Kathreen Devoid   On: 03/25/2021 06:36   US Venous Img Lower Unilateral Right  Result Date: 03/24/2021 CLINICAL DATA:  Right leg pain, palpable cord around femoral vein EXAM: RIGHT LOWER EXTREMITY VENOUS DOPPLER ULTRASOUND TECHNIQUE: Gray-scale sonography with compression, as well as color and duplex ultrasound, were performed to evaluate the deep venous system(s) from the level of the common femoral vein through the popliteal and proximal calf veins. COMPARISON:  03/11/2021 FINDINGS: VENOUS Normal compressibility of the common femoral, superficial femoral, and popliteal veins, as well as the visualized calf veins.  Visualized portions of profunda femoral vein and great saphenous vein unremarkable. No filling defects to suggest DVT on grayscale or color Doppler imaging. Doppler waveforms show normal direction of venous flow, normal respiratory plasticity and response to augmentation. Limited views of the contralateral common femoral vein are unremarkable. OTHER None. Limitations: none IMPRESSION: 1. No evidence of deep venous thrombosis within the right lower extremity. Electronically Signed   By: Randa Ngo M.D.   On: 03/24/2021 23:50   DG Foot Complete Right  Result Date: 03/24/2021 CLINICAL DATA:  Great toe osteomyelitis EXAM: RIGHT FOOT COMPLETE - 3+ VIEW COMPARISON:  03/11/2021 FINDINGS: Interval increase in bony erosion of the tuft of the distal phalanx right great toe. No other bony abnormality. No fracture. Joint spaces are preserved. Soft tissue edema about the great toe. IMPRESSION: 1. Interval increase in bony erosion of the tuft of the distal phalanx right great toe, consistent with osteomyelitis. 2. Soft tissue edema about the great toe. Electronically Signed   By: Eddie Candle M.D.   On: 03/24/2021 22:54   ASSESSMENT AND PLAN:  Joshua [name] is a 50 y.o. African-American male with medical history significant for type II obese mellitus, CHF and hypertension, presented to the emergency room with acute onset of worsening right foot swelling extending from his big toe to the lower leg currently is associated erythema, tenderness, tightness with warmth  Acute on chronic osteomyelitis of the right big toe  with severe nonpurulent cellulitis/status of the right great toe and lower leg.. - IV cefepime and vancomycin per protocol. -- Patient has seen [facility] podiatry in the past. The recommended amputation however patient has refused in the past and has been noncompliant with follow-up. -- Seen by podiatry Dr. Cleda Mccreedy. Patient still refuses for amputation. Podiatry has signed off -- Infectious disease  consultation with Dr. Ola Spurr. -- Blood culture negative -- MRI right footSevere bone marrow edema in the first distal phalanx and head of the first proximal phalanx most concerning for osteomyelitis. Severe cellulitis of the great toe extending into the more proximal medial forefoot. Complex fluid collection along the dorsal aspect of the great toe measuring 1.8 x 0.4 x 2.6 cm consistent with an abscess.    Sepsis secondary to osteomyelitis, as manifested by significant leukocytosis, fever, tachycardia and tachypnea, possibly secondary to noncompliance. -IV antibiotic antibiotics as above.   Essential hypertension. -continue Coreg and Cozaar.    Type 2 diabetes mellitus with peripheral neuropathy, diabetic foot - patient on supplement coverage with NovoLog  - continue basal coverage. -Metformin will be held off.  Family communication :none Consults : podiatry, infectious disease CODE STATUS: full DVT Prophylaxis : Lovenox Level of care: Med-Surg Status is: Inpatient  Remains inpatient appropriate because:Inpatient level of care appropriate due to severity of illness   Dispo: The patient is from: Home              Anticipated d/c is to: Home              Patient currently is not medically stable to d/c.   Difficult to place patient No        TOTAL TIME TAKING CARE OF THIS PATIENT: 30 minutes.  >50% time spent on counselling and coordination of care  Note: This dictation was prepared with Dragon dictation along with smaller phrase technology. Any transcriptional errors that result from this process are unintentional.  Fritzi Mandes M.D    [facility]   CC: Primary care physician; Patient, No Pcp Per (Inactive)Patient ID: Joshua [name], male   DOB: 1971/01/31, 50 y.o.   MRN: [id number]

## 2021-03-25 NOTE — Consult Note (Signed)
Infectious Disease     Reason for Consult: Osteomyelitis    Referring Physician: Nicholes Mango Date of Admission:  03/24/2021   Active Problems:   Acute osteomyelitis of toe, right (HCC)   HPI: Joshua [name] is a 50 y.o. male admitted 4/11 with pain in R foot with swelling, redness of R great toe and fevers and chills.   He has had recent admit with osteo noted and amptation recommended of the great toe but he refused. Was last seen ID 3/8 with rec augmentin for 2 further weeks. He tells me he did not take it after that admission. He was then readmitted 3/29-4/1 with chest pain, body aches and continued foot pain. He was found to have PNA and dced on oral augmentin. He was seen by podiatry at that visit and again advised regarding amputation but refused.  He tells me he did take the augmentin for a few days after that DC but felt like it was making the toe worse so he stopped it.    In ED wbc 18, temp 101. MRI today shows severe marrow edema 1st distal phalanx and head of 1st prox phalanx. Severe cellulitis of great toe extending in prox foot with a fluid collection along dorsal great toe. He was seen by podiatry but is again refusing amputation. He wishes to have care elsewhere.  He has a  medical history significant for type II Diabetes(most recent A1c > 11), CAD s/p stenting, CHF and hypertension, homelessness.  He had angio during recent admission 3/4 with no intervention needed.  Recent HIV neg, Recent tox screen + cocaine and THC   Past Medical History:  Diagnosis Date  . CHF (congestive heart failure) ([place])   . Diabetes mellitus without complication ([place])   . Hypertension    Past Surgical History:  Procedure Laterality Date  . CORONARY/GRAFT ACUTE MI REVASCULARIZATION N/A 09/09/2018   Procedure: Coronary/Graft Acute MI Revascularization;  Surgeon: Yolonda Kida, MD;  Location: [facility];  Service: Cardiovascular;  Laterality: N/A;  . LEFT HEART CATH AND  CORONARY ANGIOGRAPHY N/A 09/09/2018   Procedure: LEFT HEART CATH AND CORONARY ANGIOGRAPHY;  Surgeon: Yolonda Kida, MD;  Location: [facility];  Service: Cardiovascular;  Laterality: N/A;  . LOWER EXTREMITY ANGIOGRAPHY Right 02/14/2021   Procedure: Lower Extremity Angiography;  Surgeon: Katha Cabal, MD;  Location: [facility];  Service: Cardiovascular;  Laterality: Right;  . none    . STENT PLACEMENT VASCULAR ([place] [facility])     Social History   Tobacco Use  . Smoking status: Current Every Day Smoker  . Smokeless tobacco: Never Used  Vaping Use  . Vaping Use: Never used  Substance Use Topics  . Alcohol use: Yes    Alcohol/week: 0.0 standard drinks  . Drug use: Not Currently    Comment: pt states no found unknown white substance in mouth   Family History  Problem Relation Age of Onset  . Cancer Mother   . CAD Brother     Allergies: No Known Allergies  Current antibiotics: Antibiotics Given (last 72 hours)    Date/Time Action Medication Dose Rate   03/24/21 2245 New Bag/Given   ceFEPIme (MAXIPIME) 2 g in sodium chloride 0.9 % 100 mL IVPB 2 g 200 mL/hr   03/25/21 0002 New Bag/Given   vancomycin (VANCOREADY) IVPB 1750 mg/350 mL 1,750 mg 175 mL/hr   03/25/21 0616 New Bag/Given   ceFEPIme (MAXIPIME) 2 g in sodium chloride 0.9 % 100  mL IVPB 2 g 200 mL/hr   03/25/21 1156 New Bag/Given   vancomycin (VANCOREADY) IVPB 1250 mg/250 mL 1,250 mg 166.7 mL/hr      MEDICATIONS: . ascorbic acid  250 mg Oral BID  . carvedilol  3.125 mg Oral BID WC  . dapagliflozin propanediol  5 mg Oral Daily  . fluticasone  2 spray Each Nare Daily  . gabapentin  100 mg Oral QHS  . insulin glargine  40 Units Subcutaneous Daily  . losartan  25 mg Oral Daily  . multivitamin with minerals  1 tablet Oral Daily  . spironolactone  25 mg Oral Daily    Review of Systems - 11 systems reviewed and negative per HPI   OBJECTIVE: Temp:  [98.6 F (37 C)-101 F (38.3 C)] 98.6 F (37 C)  (04/12 1143) Pulse Rate:  [82-107] 82 (04/12 1143) Resp:  [16-20] 19 (04/12 1143) BP: (105-132)/(73-92) 105/73 (04/12 1143) SpO2:  [95 %-100 %] 100 % (04/12 1143) Weight:  [82.5 kg] 82.5 kg (04/11 2140) Physical Exam  Constitutional: He is oriented to person, place, and time. Disheveled HENT: anicteric  Mouth/Throat: Oropharynx is clear and moist. No oropharyngeal exudate.  Cardiovascular: Normal rate, regular rhythm and normal heart sounds. Exam reveals no gallop and no friction rub.  Pulmonary/Chest: Effort normal and breath sounds normal. No respiratory distress. He has no wheezes.  Abdominal: Soft. Bowel sounds are normal. He exhibits no distension. There is no tenderness.  Lymphadenopathy: He has no cervical adenopathy.  Neurological: He is alert and oriented to person, place, and time.  Skin: R great toe swollen to twice the size of L. Has fluctance over dorsum of toe and extending up to dorsum of foot Psychiatric: He has a normal mood and affect. His behavior is abnormal. He is focused on telling me that the augmentin has affected his body and wishes me to agree or disagree with this statement.        LABS: Results for orders placed or performed during the hospital encounter of 03/24/21 (from the past 48 hour(s))  CBC with Differential     Status: Abnormal   Collection Time: 03/24/21  9:44 PM  Result Value Ref Range   WBC 18.9 (H) 4.0 - 10.5 K/uL   RBC 3.96 (L) 4.22 - 5.81 MIL/uL   Hemoglobin 10.4 (L) 13.0 - 17.0 g/dL   HCT 33.1 (L) 39.0 - 52.0 %   MCV 83.6 80.0 - 100.0 fL   MCH 26.3 26.0 - 34.0 pg   MCHC 31.4 30.0 - 36.0 g/dL   RDW 14.7 11.5 - 15.5 %   Platelets 424 (H) 150 - 400 K/uL   nRBC 0.0 0.0 - 0.2 %   Neutrophils Relative % 80 %   Neutro Abs 15.2 (H) 1.7 - 7.7 K/uL   Lymphocytes Relative 11 %   Lymphs Abs 2.0 0.7 - 4.0 K/uL   Monocytes Relative 9 %   Monocytes Absolute 1.6 (H) 0.1 - 1.0 K/uL   Eosinophils Relative 0 %   Eosinophils Absolute 0.1 0.0 - 0.5  K/uL   Basophils Relative 0 %   Basophils Absolute 0.1 0.0 - 0.1 K/uL   Immature Granulocytes 0 %   Abs Immature Granulocytes 0.07 0.00 - 0.07 K/uL    Comment: Performed at [facility], [address] [place], [place] [address]  Comprehensive metabolic panel     Status: Abnormal   Collection Time: 03/24/21  9:44 PM  Result Value Ref Range   Sodium 133 (L)  135 - 145 mmol/L   Potassium 4.2 3.5 - 5.1 mmol/L   Chloride 100 98 - 111 mmol/L   CO2 26 22 - 32 mmol/L   Glucose, Bld 190 (H) 70 - 99 mg/dL    Comment: Glucose reference range applies only to samples taken after fasting for at least 8 hours.   BUN 8 6 - 20 mg/dL   Creatinine, Ser 0.91 0.61 - 1.24 mg/dL   Calcium 8.4 (L) 8.9 - 10.3 mg/dL   Total Protein 7.2 6.5 - 8.1 g/dL   Albumin 3.3 (L) 3.5 - 5.0 g/dL   AST 15 15 - 41 U/L   ALT 10 0 - 44 U/L   Alkaline Phosphatase 63 38 - 126 U/L   Total Bilirubin 0.8 0.3 - 1.2 mg/dL   GFR, Estimated >60 >60 mL/min    Comment: (NOTE) Calculated using the CKD-EPI Creatinine Equation (2021)    Anion gap 7 5 - 15    Comment: Performed at [facility], [address] [place], [place] [address]  Lactic acid, plasma     Status: None   Collection Time: 03/24/21 10:34 PM  Result Value Ref Range   Lactic Acid, Venous 1.1 0.5 - 1.9 mmol/L    Comment: Performed at [facility], [address] [place], [place] [address]  Blood culture (routine x 2)     Status: None (Preliminary result)   Collection Time: 03/24/21 10:34 PM   Specimen: BLOOD  Result Value Ref Range   Specimen Description BLOOD BLOOD LEFT FOREARM    Special Requests      BOTTLES DRAWN AEROBIC AND ANAEROBIC Blood Culture results may not be optimal due to an excessive volume of blood received in culture bottles   Culture      NO GROWTH < 12 HOURS Performed at [facility], [address] [place], [place] [address]    Report Status PENDING   Blood culture (routine x 2)     Status: None  (Preliminary result)   Collection Time: 03/24/21 10:34 PM   Specimen: BLOOD  Result Value Ref Range   Specimen Description BLOOD LEFT ANTECUBITAL    Special Requests      BOTTLES DRAWN AEROBIC AND ANAEROBIC Blood Culture results may not be optimal due to an excessive volume of blood received in culture bottles   Culture      NO GROWTH < 12 HOURS Performed at [facility], [place], [place] [address]    Report Status PENDING   SARS CORONAVIRUS 2 (TAT 6-24 HRS) Nasopharyngeal Nasopharyngeal Swab     Status: None   Collection Time: 03/24/21 10:39 PM   Specimen: Nasopharyngeal Swab  Result Value Ref Range   SARS Coronavirus 2 NEGATIVE NEGATIVE    Comment: (NOTE) SARS-CoV-2 target nucleic acids are NOT DETECTED.  The SARS-CoV-2 RNA is generally detectable in upper and lower respiratory specimens during the acute phase of infection. Negative results do not preclude SARS-CoV-2 infection, do not rule out co-infections with other pathogens, and should not be used as the sole basis for treatment or other patient management decisions. Negative results must be combined with clinical observations, patient history, and epidemiological information. The expected result is Negative.  Fact Sheet for Patients: SugarRoll.be  Fact Sheet for Healthcare Providers: https://www.woods-mathews.com/  This test is not yet approved or cleared by the [place] FDA and  has been authorized for detection and/or diagnosis of SARS-CoV-2 by FDA under an Emergency Use Authorization (EUA). This  EUA will remain  in effect (meaning this test can be used) for the duration of the COVID-19 declaration under Se ction 564(b)(1) of the Act, 21 U.S.C. section 360bbb-3(b)(1), unless the authorization is terminated or revoked sooner.  Performed at [facility], [place] [address], [place], [place] [address]   Lactic acid, plasma     Status: None    Collection Time: 03/25/21 12:13 AM  Result Value Ref Range   Lactic Acid, Venous 1.6 0.5 - 1.9 mmol/L    Comment: Performed at [facility], [address] [place], [place] [address]  Basic metabolic panel     Status: Abnormal   Collection Time: 03/25/21  4:49 AM  Result Value Ref Range   Sodium 135 135 - 145 mmol/L   Potassium 4.0 3.5 - 5.1 mmol/L   Chloride 108 98 - 111 mmol/L   CO2 22 22 - 32 mmol/L   Glucose, Bld 157 (H) 70 - 99 mg/dL    Comment: Glucose reference range applies only to samples taken after fasting for at least 8 hours.   BUN 8 6 - 20 mg/dL   Creatinine, Ser 0.68 0.61 - 1.24 mg/dL   Calcium 7.7 (L) 8.9 - 10.3 mg/dL   GFR, Estimated >60 >60 mL/min    Comment: (NOTE) Calculated using the CKD-EPI Creatinine Equation (2021)    Anion gap 5 5 - 15    Comment: Performed at [facility], [address] [place], [place] [address]  CBC     Status: Abnormal   Collection Time: 03/25/21  4:49 AM  Result Value Ref Range   WBC 17.6 (H) 4.0 - 10.5 K/uL   RBC 3.99 (L) 4.22 - 5.81 MIL/uL   Hemoglobin 10.4 (L) 13.0 - 17.0 g/dL   HCT 33.5 (L) 39.0 - 52.0 %   MCV 84.0 80.0 - 100.0 fL   MCH 26.1 26.0 - 34.0 pg   MCHC 31.0 30.0 - 36.0 g/dL   RDW 14.8 11.5 - 15.5 %   Platelets 415 (H) 150 - 400 K/uL   nRBC 0.0 0.0 - 0.2 %    Comment: Performed at [facility], [address] [place], [place] [address]  Glucose, capillary     Status: Abnormal   Collection Time: 03/25/21  8:29 AM  Result Value Ref Range   Glucose-Capillary 153 (H) 70 - 99 mg/dL    Comment: Glucose reference range applies only to samples taken after fasting for at least 8 hours.  Glucose, capillary     Status: Abnormal   Collection Time: 03/25/21 11:44 AM  Result Value Ref Range   Glucose-Capillary 139 (H) 70 - 99 mg/dL    Comment: Glucose reference range applies only to samples taken after fasting for at least 8 hours.   No components found for: ESR, C REACTIVE  PROTEIN MICRO: Recent Results (from the past 720 hour(s))  Blood culture (routine x 2)     Status: None   Collection Time: 03/11/21  1:32 PM   Specimen: BLOOD  Result Value Ref Range Status   Specimen Description BLOOD BLOOD RIGHT FOREARM  Final   Special Requests   Final    BOTTLES DRAWN AEROBIC AND ANAEROBIC Blood Culture adequate volume   Culture   Final    NO GROWTH 5 DAYS Performed at [facility], [address] [place], [place] [address]    Report Status 03/16/2021 FINAL  Final  Blood culture (routine x 2)     Status:  None   Collection Time: 03/11/21  1:32 PM   Specimen: BLOOD  Result Value Ref Range Status   Specimen Description BLOOD BLOOD RIGHT WRIST  Final   Special Requests   Final    BOTTLES DRAWN AEROBIC AND ANAEROBIC Blood Culture results may not be optimal due to an inadequate volume of blood received in culture bottles   Culture   Final    NO GROWTH 5 DAYS Performed at [facility], [address] [place], [place] [address]    Report Status 03/16/2021 FINAL  Final  Resp Panel by RT-PCR (Flu A&B, Covid) Nasopharyngeal Swab     Status: None   Collection Time: 03/11/21  1:40 PM   Specimen: Nasopharyngeal Swab; Nasopharyngeal(NP) swabs in vial transport medium  Result Value Ref Range Status   SARS Coronavirus 2 by RT PCR NEGATIVE NEGATIVE Final    Comment: (NOTE) SARS-CoV-2 target nucleic acids are NOT DETECTED.  The SARS-CoV-2 RNA is generally detectable in upper respiratory specimens during the acute phase of infection. The lowest concentration of SARS-CoV-2 viral copies this assay can detect is 138 copies/mL. A negative result does not preclude SARS-Cov-2 infection and should not be used as the sole basis for treatment or other patient management decisions. A negative result may occur with  improper specimen collection/handling, submission of specimen other than nasopharyngeal swab, presence of viral mutation(s) within the areas  targeted by this assay, and inadequate number of viral copies(<138 copies/mL). A negative result must be combined with clinical observations, patient history, and epidemiological information. The expected result is Negative.  Fact Sheet for Patients:  EntrepreneurPulse.com.au  Fact Sheet for Healthcare Providers:  IncredibleEmployment.be  This test is no t yet approved or cleared by the [place] FDA and  has been authorized for detection and/or diagnosis of SARS-CoV-2 by FDA under an Emergency Use Authorization (EUA). This EUA will remain  in effect (meaning this test can be used) for the duration of the COVID-19 declaration under Section 564(b)(1) of the Act, 21 U.S.C.section 360bbb-3(b)(1), unless the authorization is terminated  or revoked sooner.       Influenza A by PCR NEGATIVE NEGATIVE Final   Influenza B by PCR NEGATIVE NEGATIVE Final    Comment: (NOTE) The Xpert Xpress SARS-CoV-2/FLU/RSV plus assay is intended as an aid in the diagnosis of influenza from Nasopharyngeal swab specimens and should not be used as a sole basis for treatment. Nasal washings and aspirates are unacceptable for Xpert Xpress SARS-CoV-2/FLU/RSV testing.  Fact Sheet for Patients: EntrepreneurPulse.com.au  Fact Sheet for Healthcare Providers: IncredibleEmployment.be  This test is not yet approved or cleared by the [place] FDA and has been authorized for detection and/or diagnosis of SARS-CoV-2 by FDA under an Emergency Use Authorization (EUA). This EUA will remain in effect (meaning this test can be used) for the duration of the COVID-19 declaration under Section 564(b)(1) of the Act, 21 U.S.C. section 360bbb-3(b)(1), unless the authorization is terminated or revoked.  Performed at [facility], [address] [place], [place] [address]   Culture, sputum-assessment     Status: None   Collection  Time: 03/11/21 10:12 PM   Specimen: Sputum  Result Value Ref Range Status   Specimen Description SPUTUM  Final   Special Requests NONE  Final   Sputum evaluation   Final    THIS SPECIMEN IS ACCEPTABLE FOR SPUTUM CULTURE Performed at [facility], [address], [place], [place] [address]    Report Status 03/11/2021 FINAL  Final  Culture, Respiratory w Gram Stain     Status: None   Collection Time: 03/11/21 10:12 PM   Specimen: SPU  Result Value Ref Range Status   Specimen Description   Final    SPUTUM Performed at [facility], [address] [place], [place] [address]    Special Requests   Final    NONE Reflexed from [id number] Performed at [facility], [place], [place] [address]    Gram Stain   Final    RARE WBC PRESENT,BOTH PMN AND MONONUCLEAR NO ORGANISMS SEEN    Culture   Final    RARE Normal respiratory flora-no Staph aureus or Pseudomonas seen Performed at [facility], [place] [address] [place], [place] [address]    Report Status 03/14/2021 FINAL  Final  Blood culture (routine x 2)     Status: None (Preliminary result)   Collection Time: 03/24/21 10:34 PM   Specimen: BLOOD  Result Value Ref Range Status   Specimen Description BLOOD BLOOD LEFT FOREARM  Final   Special Requests   Final    BOTTLES DRAWN AEROBIC AND ANAEROBIC Blood Culture results may not be optimal due to an excessive volume of blood received in culture bottles   Culture   Final    NO GROWTH < 12 HOURS Performed at [facility], [address], [place], [place] [address]    Report Status PENDING  Incomplete  Blood culture (routine x 2)     Status: None (Preliminary result)   Collection Time: 03/24/21 10:34 PM   Specimen: BLOOD  Result Value Ref Range Status   Specimen Description BLOOD LEFT ANTECUBITAL  Final   Special Requests   Final    BOTTLES DRAWN AEROBIC AND ANAEROBIC Blood Culture results may not be optimal due to an excessive  volume of blood received in culture bottles   Culture   Final    NO GROWTH < 12 HOURS Performed at [facility], [address] [place], [place] [address]    Report Status PENDING  Incomplete  SARS CORONAVIRUS 2 (TAT 6-24 HRS) Nasopharyngeal Nasopharyngeal Swab     Status: None   Collection Time: 03/24/21 10:39 PM   Specimen: Nasopharyngeal Swab  Result Value Ref Range Status   SARS Coronavirus 2 NEGATIVE NEGATIVE Final    Comment: (NOTE) SARS-CoV-2 target nucleic acids are NOT DETECTED.  The SARS-CoV-2 RNA is generally detectable in upper and lower respiratory specimens during the acute phase of infection. Negative results do not preclude SARS-CoV-2 infection, do not rule out co-infections with other pathogens, and should not be used as the sole basis for treatment or other patient management decisions. Negative results must be combined with clinical observations, patient history, and epidemiological information. The expected result is Negative.  Fact Sheet for Patients: SugarRoll.be  Fact Sheet for Healthcare Providers: https://www.woods-mathews.com/  This test is not yet approved or cleared by the [place] FDA and  has been authorized for detection and/or diagnosis of SARS-CoV-2 by FDA under an Emergency Use Authorization (EUA). This EUA will remain  in effect (meaning this test can be used) for the duration of the COVID-19 declaration under Se ction 564(b)(1) of the Act, 21 U.S.C. section 360bbb-3(b)(1), unless the authorization is terminated or revoked sooner.  Performed at [facility], [place] [address] [place], [place] [address]     IMAGING: DG Chest 2 View  Result Date: 03/11/2021 CLINICAL DATA:  Chest pain EXAM: CHEST - 2 VIEW COMPARISON:  10/03/2020 FINDINGS: The heart size and mediastinal contours are within normal limits. Both lungs are clear. The visualized skeletal structures are unremarkable.  IMPRESSION: No acute abnormality of the lungs. Electronically Signed   By: Eddie Candle M.D.   On: 03/11/2021 12:27   CT Angio Chest PE W and/or Wo Contrast  Result Date: 03/11/2021 CLINICAL DATA:  Chest pain EXAM: CT ANGIOGRAPHY CHEST WITH CONTRAST TECHNIQUE: Multidetector CT imaging of the chest was performed using the standard protocol during bolus administration of intravenous contrast. Multiplanar CT image reconstructions and MIPs were obtained to evaluate the vascular anatomy. CONTRAST:  12m OMNIPAQUE IOHEXOL 350 MG/ML SOLN COMPARISON:  Chest radiograph March 11, 2021 FINDINGS: Cardiovascular: There is no demonstrable pulmonary embolus. There is no thoracic aortic aneurysm or dissection. Visualized great vessels appear normal. Right innominate and left common carotid arteries arise as a common trunk, there is calcification in multiple coronary arteries. There is no pericardial effusion or pericardial thickening. Mediastinum/Nodes: Thyroid appears normal. No evident thoracic adenopathy. Small hiatal hernia noted. Lungs/Pleura: There is ill-defined airspace opacity in portions of each lower lobe, with airspace opacity in portions of all segments of the right lower lobe and in portions of the superior segment, posterior segment, and lateral segment left lower lobe. Upper lobes and right middle lobe are clear. No pleural effusions are evident. No pneumothorax. Trachea and major bronchial structures are patent. Upper Abdomen: Stomach mildly distended with fluid and air. Visualized upper abdominal structures otherwise appear unremarkable. Musculoskeletal: No blastic or lytic bone lesions. No evident chest wall lesions. Review of the MIP images confirms the above findings. IMPRESSION: 1. no demonstrable pulmonary embolus. No thoracic aortic aneurysm or dissection. There are multiple foci of coronary artery calcification. 2. Areas of pneumonia in each lower lobe, slightly more severe on the right than the left.  No pleural effusions. 3.  Small hiatal hernia. 4.  No evident adenopathy. Electronically Signed   By: WLowella GripIII M.D.   On: 03/11/2021 15:15   MR FOOT RIGHT W WO CONTRAST  Result Date: 03/25/2021 CLINICAL DATA:  Type 2 diabetes.  Infection of the great toe. EXAM: MRI OF THE RIGHT FOREFOOT WITHOUT AND WITH CONTRAST TECHNIQUE: Multiplanar, multisequence MR imaging of the right forefoot was performed before and after the administration of intravenous contrast. CONTRAST:  839mGADAVIST GADOBUTROL 1 MMOL/ML IV SOLN COMPARISON:  None. FINDINGS: Bones/Joint/Cartilage Severe soft tissue edema of the great toe extending into the more proximal medial forefoot with enhancement most consistent with cellulitis. Complex fluid collection along the dorsal aspect of the great toe measuring 1.8 x 0.4 x 2.6 cm consistent with an abscess. Large skin blister along the medial aspect of the great toe. Severe bone marrow edema in the first distal phalanx and head of the first proximal phalanx most concerning for osteomyelitis. Mild osteoarthritis of the first MTP joint with subchondral reactive marrow changes. No acute fracture or dislocation. Normal alignment. No joint effusion. Ligaments Collateral ligaments are intact.  Lisfranc ligament is intact. Muscles and Tendons Flexor, peroneal and extensor compartment tendons are intact. Mild edema in the plantar musculature likely neurogenic. Soft tissue No fluid collection or hematoma.  No soft tissue mass. IMPRESSION: 1. Severe bone marrow edema in the first distal phalanx and head of the first proximal phalanx most concerning for osteomyelitis. Severe cellulitis of the great toe extending into the more proximal medial forefoot. Complex fluid collection along the dorsal aspect of the great toe measuring 1.8 x 0.4 x 2.6 cm consistent with  an abscess. Electronically Signed   By: Kathreen Devoid   On: 03/25/2021 06:24   MR TIBIA FIBULA RIGHT W WO CONTRAST  Result Date:  03/25/2021 CLINICAL DATA:  Soft tissue infection of the lower leg EXAM: MRI OF LOWER RIGHT EXTREMITY WITHOUT AND WITH CONTRAST TECHNIQUE: Multiplanar, multisequence MR imaging of the right lower leg was performed both before and after administration of intravenous contrast. CONTRAST:  26m GADAVIST GADOBUTROL 1 MMOL/ML IV SOLN COMPARISON:  None. FINDINGS: Bones/Joint/Cartilage No marrow signal abnormality. No fracture or dislocation. Normal alignment. No joint effusion. Ligaments Collateral ligaments are intact. Muscles and Tendons Muscles are normal. Flexor, extensor, peroneal and Achilles tendons are intact. Soft tissue No fluid collection or hematoma. No soft tissue mass. Mild soft tissue edema along the medial aspect of the right lower leg with mild enhancement on postcontrast imaging concerning for mild cellulitis. No drainable fluid collection to suggest an abscess. Cystic mass along the popliteus muscle likely reflecting a ganglion cyst measuring 1.8 cm. Small Baker's cyst. IMPRESSION: 1. No osteomyelitis of the right lower leg. 2. Mild cellulitis of the right lower leg. No drainable fluid collection to suggest an abscess. Electronically Signed   By: HKathreen Devoid  On: 03/25/2021 06:36   [place]Venous Img Lower Bilateral (DVT)  Result Date: 03/11/2021 CLINICAL DATA:  Elevated D-dimer and leg pain EXAM: BILATERAL LOWER EXTREMITY VENOUS DOPPLER ULTRASOUND TECHNIQUE: Gray-scale sonography with graded compression, as well as color Doppler and duplex ultrasound were performed to evaluate the lower extremity deep venous systems from the level of the common femoral vein and including the common femoral, femoral, profunda femoral, popliteal and calf veins including the posterior tibial, peroneal and gastrocnemius veins when visible. The superficial great saphenous vein was also interrogated. Spectral Doppler was utilized to evaluate flow at rest and with distal augmentation maneuvers in the common femoral, femoral and  popliteal veins. COMPARISON:  None. FINDINGS: RIGHT LOWER EXTREMITY Common Femoral Vein: No evidence of thrombus. Normal compressibility, respiratory phasicity and response to augmentation. Saphenofemoral Junction: No evidence of thrombus. Normal compressibility and flow on color Doppler imaging. Profunda Femoral Vein: No evidence of thrombus. Normal compressibility and flow on color Doppler imaging. Femoral Vein: No evidence of thrombus. Normal compressibility, respiratory phasicity and response to augmentation. Popliteal Vein: No evidence of thrombus. Normal compressibility, respiratory phasicity and response to augmentation. Calf Veins: No evidence of thrombus. Normal compressibility and flow on color Doppler imaging. Superficial Great Saphenous Vein: No evidence of thrombus. Normal compressibility. Venous Reflux:  None. Other Findings:  None. LEFT LOWER EXTREMITY Common Femoral Vein: No evidence of thrombus. Normal compressibility, respiratory phasicity and response to augmentation. Saphenofemoral Junction: No evidence of thrombus. Normal compressibility and flow on color Doppler imaging. Profunda Femoral Vein: No evidence of thrombus. Normal compressibility and flow on color Doppler imaging. Femoral Vein: No evidence of thrombus. Normal compressibility, respiratory phasicity and response to augmentation. Popliteal Vein: No evidence of thrombus. Normal compressibility, respiratory phasicity and response to augmentation. Calf Veins: No evidence of thrombus. Normal compressibility and flow on color Doppler imaging. Superficial Great Saphenous Vein: No evidence of thrombus. Normal compressibility. Venous Reflux:  None. Other Findings:  None. IMPRESSION: No evidence of deep venous thrombosis in either lower extremity. Electronically Signed   By: MInez CatalinaM.D.   On: 03/11/2021 19:53   [place]Venous Img Lower Unilateral Right  Result Date: 03/24/2021 CLINICAL DATA:  Right leg pain, palpable cord around femoral  vein EXAM: RIGHT LOWER EXTREMITY VENOUS DOPPLER ULTRASOUND TECHNIQUE: Gray-scale  sonography with compression, as well as color and duplex ultrasound, were performed to evaluate the deep venous system(s) from the level of the common femoral vein through the popliteal and proximal calf veins. COMPARISON:  03/11/2021 FINDINGS: VENOUS Normal compressibility of the common femoral, superficial femoral, and popliteal veins, as well as the visualized calf veins. Visualized portions of profunda femoral vein and great saphenous vein unremarkable. No filling defects to suggest DVT on grayscale or color Doppler imaging. Doppler waveforms show normal direction of venous flow, normal respiratory plasticity and response to augmentation. Limited views of the contralateral common femoral vein are unremarkable. OTHER None. Limitations: none IMPRESSION: 1. No evidence of deep venous thrombosis within the right lower extremity. Electronically Signed   By: Randa Ngo M.D.   On: 03/24/2021 23:50   DG Foot Complete Right  Result Date: 03/24/2021 CLINICAL DATA:  Great toe osteomyelitis EXAM: RIGHT FOOT COMPLETE - 3+ VIEW COMPARISON:  03/11/2021 FINDINGS: Interval increase in bony erosion of the tuft of the distal phalanx right great toe. No other bony abnormality. No fracture. Joint spaces are preserved. Soft tissue edema about the great toe. IMPRESSION: 1. Interval increase in bony erosion of the tuft of the distal phalanx right great toe, consistent with osteomyelitis. 2. Soft tissue edema about the great toe. Electronically Signed   By: Eddie Candle M.D.   On: 03/24/2021 22:54   DG Foot Complete Right  Result Date: 03/11/2021 CLINICAL DATA:  Foot pain EXAM: RIGHT FOOT COMPLETE - 3+ VIEW COMPARISON:  None. FINDINGS: No acute fracture or dislocation. Mild irregularity along the distal medial tuft of the first distal phalanx. Soft tissues are unremarkable. IMPRESSION: No acute osseous injury of the right foot. Mild irregularity  along the distal medial tuft of the first distal phalanx concerning for osteomyelitis. Electronically Signed   By: Kathreen Devoid   On: 03/11/2021 13:54    Assessment:   Joshua [name] is a 50 y.o. male with multiple medical issues including poorly controlled DM (A1c > 11), homelessness, CAD, CHF, polysubstance abuse (Cocaine and THC) who had developed a wound on distal R toe after pulling on a skin scab (photo from 3/4) During early admission in March seen by podiatry and rec distal toe amputation but pt refused. He was rec to take augmentin but tells me he did not take. Readmitted late March with CP (Cocaine +) and PNA and again podiatry rec amputation but pt refused. He did start augmentin as otpt.  Now readmitted with fever, leukocytosis, MRI showing osteo of distal and prox phalnax as well as abscess over dorsal toe.  Only prior cx 3/4 mixed but also had rare  Enterobacter cloacae (No Staph aureus or Pseudomonas noted).  Started on vanco and cefepime. Podiatry rec toe amputation at this point but pt refuses again. I had a very long discussion with patient regarding the infection.  He is under the impression that the augmentin he was on caused the wound to worsen (although he tells me he did not take after first dc and took for a few days after second admit).  He tells me he has always had listed in his chart that he was allergic to penicillin but cannot tell me what the reaction was. Of note the prior notes suggest he did have gradual intro to amox at initial hospitalization and had no rash, or other signs of allergic reaction. Also has no eosinophilia.  He also tells me everyone he knows (100%) who had toe amputation went onto  have total leg amputation. I spent significant time explaining to him that despite what has happened in past or to his friends, despite his perception that augmentin made the toe worse, we are faced with a potentially limb and life threatening infection. He needs IV abx  and surgery. I reiterated this to him and advised risk of sepsis without surgical I and D esp with the abscess.    Recommendations Cont cefepime and vanco Add flagyl Needs amputation of toe but he has refused. Would continue to work with him to see if we can convince him of the urgency of this situation. Will need much better DM control as well.  Thank you very much for allowing me to participate in the care of this patient. Please call with questions.   Cheral Marker. Ola Spurr, MD

## 2021-03-25 NOTE — Progress Notes (Signed)
Pharmacy Antibiotic Note  Joshua [name] is a 50 y.o. male admitted on 03/24/2021 with cellulitis.  Pharmacy has been consulted for Cefepime and Vancomycin dosing.  Plan: Ordered Cefepime 2 gm q8h per indication and renal fxn.  Vancomycin Pt order LD of Vanc 1750 mg per pt wt 82.5 kg  Vancomycin 1250 mg IV Q 12 hrs.  Goal AUC 400-550. Expected AUC: 475.3 SCr used: 0.91  Pharmacy will continue to follow and adjust abx dosing if warranted.   Height: 5\' 10"  (177.8 cm) Weight: 82.5 kg (181 lb 14.4 oz) IBW/kg (Calculated) : 73  Temp (24hrs), Avg:99.9 F (37.7 C), Min:98.8 F (37.1 C), Max:101 F (38.3 C)  Recent Labs  Lab 03/24/21 2144 03/24/21 2234 03/25/21 0013  WBC 18.9*  --   --   CREATININE 0.91  --   --   LATICACIDVEN  --  1.1 1.6    Estimated Creatinine Clearance: 101.4 mL/min (by C-G formula based on SCr of 0.91 mg/dL).    No Known Allergies  Antimicrobials this admission: 4/11 Cefepime >>  4/12 Vancomycin >>   Microbiology results: 4/11 BCx: Pending  Thank you for allowing pharmacy to be a part of this patient's care.  Renda Rolls, PharmD, MBA 03/25/2021 1:05 AM

## 2021-03-25 NOTE — ED Notes (Signed)
Pt returned from MRI at this time

## 2021-03-25 NOTE — ED Notes (Signed)
Pt provided meal tray at this time per provider.

## 2021-03-26 DIAGNOSIS — M86171 Other acute osteomyelitis, right ankle and foot: Secondary | ICD-10-CM | POA: Diagnosis not present

## 2021-03-26 LAB — GLUCOSE, CAPILLARY
Glucose-Capillary: 178 mg/dL — ABNORMAL HIGH (ref 70–99)
Glucose-Capillary: 97 mg/dL (ref 70–99)
Glucose-Capillary: 210 mg/dL — ABNORMAL HIGH (ref 70–99)
Glucose-Capillary: 226 mg/dL — ABNORMAL HIGH (ref 70–99)

## 2021-03-26 NOTE — Progress Notes (Signed)
Progress Note    Joshua [name]  JTT:[id number] DOB: 03-Nov-1971  DOA: 03/24/2021 PCP: Patient, No Pcp Per (Inactive)      Brief Narrative:    Medical records reviewed and are as summarized below:  Joshua [name] is a 50 y.o. male with medical history significant for chronic systolic and diastolic CHF, type 2 diabetes mellitus, hypertension,Cocaine abuse, CAD, tobacco use disorder, chronic anemia, recent hospitalization for sepsis secondary to multifocal pneumonia and osteomyelitis of the right great toe.  He had been seen by podiatrist in the past and amputation of the right big toe had been recommended.  However, patient had refused to have amputation done and preferred to treat right big toe osteomyelitis with antibiotics.    He presented to the hospital because of pain and swelling extending from the right big toe through the foot to the right lower leg.  He also complained of redness and warmth of the right foot and right leg.  He was admitted to the hospital for sepsis secondary to acute on chronic osteomyelitis of the right big toe and right lower extremity cellulitis.      Assessment/Plan:   Active Problems:   Acute osteomyelitis of toe, right (HCC)    Body mass index is 26.1 kg/m.   Sepsis secondary to acute on chronic osteomyelitis of right big toe with cellulitis of right leg and foot: Continue antibiotics.  Patient has been evaluated by the podiatrist.  Amputation of the right big toe was recommended.  However, patient still refuses to have amputation.  He has been informed that he can seek a second opinion with another podiatrist in another hospital.  He said he is aware of this but he still did not make any decision. He argued that Augmentin as previously prescribed ladies right big toe infection worse.  Hypertension: Continue antihypertensives  Type II DM with peripheral neuropathy: Continue Lantus and NovoLog as needed for  hyperglycemia.  Chronic systolic and diastolic CHF: Continue carvedilol, losartan and dapagliflozin. 2D echo on 07/16/2020 showed EF estimated at 25 to 30%,  grade 3 diastolic dysfunction, moderate MR  Diet Order            Diet heart healthy/carb modified Room service appropriate? Yes; Fluid consistency: Thin  Diet effective now                    Consultants:  Podiatrist  Infectious disease specialist  Procedures:  None    Medications:   . ascorbic acid  250 mg Oral BID  . carvedilol  3.125 mg Oral BID WC  . dapagliflozin propanediol  5 mg Oral Daily  . enoxaparin (LOVENOX) injection  40 mg Subcutaneous Q24H  . fluticasone  2 spray Each Nare Daily  . gabapentin  100 mg Oral QHS  . insulin aspart  0-15 Units Subcutaneous TID AC & HS  . insulin glargine  40 Units Subcutaneous Daily  . losartan  25 mg Oral Daily  . metroNIDAZOLE  500 mg Oral Q8H  . multivitamin with minerals  1 tablet Oral Daily  . spironolactone  25 mg Oral Daily   Continuous Infusions: . sodium chloride 100 mL/hr at 03/26/21 1319  . ceFEPime (MAXIPIME) IV 2 g (03/26/21 0515)  . vancomycin 1,250 mg (03/26/21 1318)     Anti-infectives (From admission, onward)   Start     Dose/Rate Route Frequency Ordered Stop   03/25/21 2200  metroNIDAZOLE (FLAGYL) tablet 500 mg  500 mg Oral Every 8 hours 03/25/21 1839     03/25/21 1200  vancomycin (VANCOREADY) IVPB 1250 mg/250 mL        1,250 mg 166.7 mL/hr over 90 Minutes Intravenous Every 12 hours 03/25/21 0057     03/25/21 0600  ceFEPIme (MAXIPIME) 2 g in sodium chloride 0.9 % 100 mL IVPB        2 g 200 mL/hr over 30 Minutes Intravenous Every 8 hours 03/25/21 0054     03/24/21 2345  vancomycin (VANCOCIN) IVPB 1000 mg/200 mL premix  Status:  Discontinued        1,000 mg 200 mL/hr over 60 Minutes Intravenous  Once 03/24/21 2331 03/24/21 2349   03/24/21 2345  ceFEPIme (MAXIPIME) 2 g in sodium chloride 0.9 % 100 mL IVPB  Status:  Discontinued         2 g 200 mL/hr over 30 Minutes Intravenous  Once 03/24/21 2331 03/25/21 0049   03/24/21 2300  vancomycin (VANCOREADY) IVPB 1750 mg/350 mL        1,750 mg 175 mL/hr over 120 Minutes Intravenous  Once 03/24/21 2256 03/25/21 0208   03/24/21 2245  vancomycin (VANCOCIN) injection 1,750 mg  Status:  Discontinued        1,750 mg Intravenous  Once 03/24/21 2221 03/24/21 2255   03/24/21 2230  ceFEPIme (MAXIPIME) 2 g in sodium chloride 0.9 % 100 mL IVPB        2 g 200 mL/hr over 30 Minutes Intravenous  Once 03/24/21 2221 03/24/21 2359             Family Communication/Anticipated D/C date and plan/Code Status   DVT prophylaxis: enoxaparin (LOVENOX) injection 40 mg Start: 03/25/21 1600 SCDs Start: 03/24/21 2330     Code Status: Full Code  Family Communication: None Disposition Plan:    Status is: Inpatient  Remains inpatient appropriate because:IV treatments appropriate due to intensity of illness or inability to take PO and Inpatient level of care appropriate due to severity of illness   Dispo: The patient is from: Home              Anticipated d/c is to: Home              Patient currently is not medically stable to d/c.   Difficult to place patient No           Subjective:   C/o pain in the right foot and right leg  Objective:    Vitals:   03/25/21 2313 03/26/21 0354 03/26/21 0742 03/26/21 1544  BP: 106/69 103/86 106/72 117/66  Pulse: 98 82 81 87  Resp: 16 16 16 16   Temp: 99.5 F (37.5 C) 98.5 F (36.9 C) 98.7 F (37.1 C) 97.7 F (36.5 C)  TempSrc: Oral Oral    SpO2: 97% 100% 98% 100%  Weight:      Height:       No data found.   Intake/Output Summary (Last 24 hours) at 03/26/2021 1642 Last data filed at 03/26/2021 1422 Gross per 24 hour  Intake 2717.78 ml  Output 14200 ml  Net -11482.22 ml   Filed Weights   03/24/21 2140  Weight: 82.5 kg    Exam:  GEN: NAD SKIN: Warm and dry EYES: EOMI.  No pallor or icterus ENT: MMM CV: RRR PULM: CTA  B ABD: soft, ND, NT, +BS CNS: AAO x 3, non focal EXT: No edema or in the right leg and right foot.  Mild swelling of the right  big toe.        Data Reviewed:   I have personally reviewed following labs and imaging studies:  Labs: Labs show the following:   Basic Metabolic Panel: Recent Labs  Lab 03/24/21 2144 03/25/21 0449  NA 133* 135  K 4.2 4.0  CL 100 108  CO2 26 22  GLUCOSE 190* 157*  BUN 8 8  CREATININE 0.91 0.68  CALCIUM 8.4* 7.7*   GFR Estimated Creatinine Clearance: 115.3 mL/min (by C-G formula based on SCr of 0.68 mg/dL). Liver Function Tests: Recent Labs  Lab 03/24/21 2144  AST 15  ALT 10  ALKPHOS 63  BILITOT 0.8  PROT 7.2  ALBUMIN 3.3*   No results for input(s): LIPASE, AMYLASE in the last 168 hours. No results for input(s): AMMONIA in the last 168 hours. Coagulation profile No results for input(s): INR, PROTIME in the last 168 hours.  CBC: Recent Labs  Lab 03/24/21 2144 03/25/21 0449  WBC 18.9* 17.6*  NEUTROABS 15.2*  --   HGB 10.4* 10.4*  HCT 33.1* 33.5*  MCV 83.6 84.0  PLT 424* 415*   Cardiac Enzymes: No results for input(s): CKTOTAL, CKMB, CKMBINDEX, TROPONINI in the last 168 hours. BNP (last 3 results) No results for input(s): PROBNP in the last 8760 hours. CBG: Recent Labs  Lab 03/25/21 1830 03/25/21 2151 03/26/21 0744 03/26/21 1132 03/26/21 1629  GLUCAP 302* 344* 178* 97 210*   D-Dimer: No results for input(s): DDIMER in the last 72 hours. Hgb A1c: No results for input(s): HGBA1C in the last 72 hours. Lipid Profile: No results for input(s): CHOL, HDL, LDLCALC, TRIG, CHOLHDL, LDLDIRECT in the last 72 hours. Thyroid function studies: No results for input(s): TSH, T4TOTAL, T3FREE, THYROIDAB in the last 72 hours.  Invalid input(s): FREET3 Anemia work up: No results for input(s): VITAMINB12, FOLATE, FERRITIN, TIBC, IRON, RETICCTPCT in the last 72 hours. Sepsis Labs: Recent Labs  Lab 03/24/21 2144 03/24/21 2234  03/25/21 0013 03/25/21 0449  WBC 18.9*  --   --  17.6*  LATICACIDVEN  --  1.1 1.6  --     Microbiology Recent Results (from the past 240 hour(s))  Blood culture (routine x 2)     Status: None (Preliminary result)   Collection Time: 03/24/21 10:34 PM   Specimen: BLOOD  Result Value Ref Range Status   Specimen Description BLOOD BLOOD LEFT FOREARM  Final   Special Requests   Final    BOTTLES DRAWN AEROBIC AND ANAEROBIC Blood Culture results may not be optimal due to an excessive volume of blood received in culture bottles   Culture   Final    NO GROWTH 2 DAYS Performed at [facility], [address] [place], [place] [address]    Report Status PENDING  Incomplete  Blood culture (routine x 2)     Status: None (Preliminary result)   Collection Time: 03/24/21 10:34 PM   Specimen: BLOOD  Result Value Ref Range Status   Specimen Description BLOOD LEFT ANTECUBITAL  Final   Special Requests   Final    BOTTLES DRAWN AEROBIC AND ANAEROBIC Blood Culture results may not be optimal due to an excessive volume of blood received in culture bottles   Culture   Final    NO GROWTH 2 DAYS Performed at [facility], [place], [place] [address]    Report Status PENDING  Incomplete  SARS CORONAVIRUS 2 (TAT 6-24 HRS) Nasopharyngeal Nasopharyngeal Swab     Status: None   Collection  Time: 03/24/21 10:39 PM   Specimen: Nasopharyngeal Swab  Result Value Ref Range Status   SARS Coronavirus 2 NEGATIVE NEGATIVE Final    Comment: (NOTE) SARS-CoV-2 target nucleic acids are NOT DETECTED.  The SARS-CoV-2 RNA is generally detectable in upper and lower respiratory specimens during the acute phase of infection. Negative results do not preclude SARS-CoV-2 infection, do not rule out co-infections with other pathogens, and should not be used as the sole basis for treatment or other patient management decisions. Negative results must be combined with clinical  observations, patient history, and epidemiological information. The expected result is Negative.  Fact Sheet for Patients: SugarRoll.be  Fact Sheet for Healthcare Providers: https://www.woods-mathews.com/  This test is not yet approved or cleared by the [place] FDA and  has been authorized for detection and/or diagnosis of SARS-CoV-2 by FDA under an Emergency Use Authorization (EUA). This EUA will remain  in effect (meaning this test can be used) for the duration of the COVID-19 declaration under Se ction 564(b)(1) of the Act, 21 U.S.C. section 360bbb-3(b)(1), unless the authorization is terminated or revoked sooner.  Performed at [facility], [place] [address], [place], [place] [address]     Procedures and diagnostic studies:  MR FOOT RIGHT W WO CONTRAST  Result Date: 03/25/2021 CLINICAL DATA:  Type 2 diabetes.  Infection of the great toe. EXAM: MRI OF THE RIGHT FOREFOOT WITHOUT AND WITH CONTRAST TECHNIQUE: Multiplanar, multisequence MR imaging of the right forefoot was performed before and after the administration of intravenous contrast. CONTRAST:  80mL GADAVIST GADOBUTROL 1 MMOL/ML IV SOLN COMPARISON:  None. FINDINGS: Bones/Joint/Cartilage Severe soft tissue edema of the great toe extending into the more proximal medial forefoot with enhancement most consistent with cellulitis. Complex fluid collection along the dorsal aspect of the great toe measuring 1.8 x 0.4 x 2.6 cm consistent with an abscess. Large skin blister along the medial aspect of the great toe. Severe bone marrow edema in the first distal phalanx and head of the first proximal phalanx most concerning for osteomyelitis. Mild osteoarthritis of the first MTP joint with subchondral reactive marrow changes. No acute fracture or dislocation. Normal alignment. No joint effusion. Ligaments Collateral ligaments are intact.  Lisfranc ligament is intact. Muscles and Tendons Flexor,  peroneal and extensor compartment tendons are intact. Mild edema in the plantar musculature likely neurogenic. Soft tissue No fluid collection or hematoma.  No soft tissue mass. IMPRESSION: 1. Severe bone marrow edema in the first distal phalanx and head of the first proximal phalanx most concerning for osteomyelitis. Severe cellulitis of the great toe extending into the more proximal medial forefoot. Complex fluid collection along the dorsal aspect of the great toe measuring 1.8 x 0.4 x 2.6 cm consistent with an abscess. Electronically Signed   By: Kathreen Devoid   On: 03/25/2021 06:24   MR TIBIA FIBULA RIGHT W WO CONTRAST  Result Date: 03/25/2021 CLINICAL DATA:  Soft tissue infection of the lower leg EXAM: MRI OF LOWER RIGHT EXTREMITY WITHOUT AND WITH CONTRAST TECHNIQUE: Multiplanar, multisequence MR imaging of the right lower leg was performed both before and after administration of intravenous contrast. CONTRAST:  25mL GADAVIST GADOBUTROL 1 MMOL/ML IV SOLN COMPARISON:  None. FINDINGS: Bones/Joint/Cartilage No marrow signal abnormality. No fracture or dislocation. Normal alignment. No joint effusion. Ligaments Collateral ligaments are intact. Muscles and Tendons Muscles are normal. Flexor, extensor, peroneal and Achilles tendons are intact. Soft tissue No fluid collection or hematoma. No soft tissue mass. Mild soft tissue edema along  the medial aspect of the right lower leg with mild enhancement on postcontrast imaging concerning for mild cellulitis. No drainable fluid collection to suggest an abscess. Cystic mass along the popliteus muscle likely reflecting a ganglion cyst measuring 1.8 cm. Small Baker's cyst. IMPRESSION: 1. No osteomyelitis of the right lower leg. 2. Mild cellulitis of the right lower leg. No drainable fluid collection to suggest an abscess. Electronically Signed   By: Kathreen Devoid   On: 03/25/2021 06:36   US Venous Img Lower Unilateral Right  Result Date: 03/24/2021 CLINICAL DATA:  Right  leg pain, palpable cord around femoral vein EXAM: RIGHT LOWER EXTREMITY VENOUS DOPPLER ULTRASOUND TECHNIQUE: Gray-scale sonography with compression, as well as color and duplex ultrasound, were performed to evaluate the deep venous system(s) from the level of the common femoral vein through the popliteal and proximal calf veins. COMPARISON:  03/11/2021 FINDINGS: VENOUS Normal compressibility of the common femoral, superficial femoral, and popliteal veins, as well as the visualized calf veins. Visualized portions of profunda femoral vein and great saphenous vein unremarkable. No filling defects to suggest DVT on grayscale or color Doppler imaging. Doppler waveforms show normal direction of venous flow, normal respiratory plasticity and response to augmentation. Limited views of the contralateral common femoral vein are unremarkable. OTHER None. Limitations: none IMPRESSION: 1. No evidence of deep venous thrombosis within the right lower extremity. Electronically Signed   By: Randa Ngo M.D.   On: 03/24/2021 23:50   DG Foot Complete Right  Result Date: 03/24/2021 CLINICAL DATA:  Great toe osteomyelitis EXAM: RIGHT FOOT COMPLETE - 3+ VIEW COMPARISON:  03/11/2021 FINDINGS: Interval increase in bony erosion of the tuft of the distal phalanx right great toe. No other bony abnormality. No fracture. Joint spaces are preserved. Soft tissue edema about the great toe. IMPRESSION: 1. Interval increase in bony erosion of the tuft of the distal phalanx right great toe, consistent with osteomyelitis. 2. Soft tissue edema about the great toe. Electronically Signed   By: Eddie Candle M.D.   On: 03/24/2021 22:54               LOS: 2 days   Engelbert Sevin  [facility]   Pager on [email or web address]. If 7PM-7AM, please contact night-coverage at [email or web address]     03/26/2021, 4:42 PM

## 2021-03-26 NOTE — Progress Notes (Signed)
[place] INFECTIOUS DISEASE PROGRESS NOTE Date of Admission:  03/24/2021     ID: Joshua [name] is a 50 y.o. 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 Feels like the pain up his leg is improving.  ROS  Eleven systems are reviewed and negative except per hpi  Medications:  Antibiotics Given (last 72 hours)    Date/Time Action Medication Dose Rate   03/24/21 2245 New Bag/Given   ceFEPIme (MAXIPIME) 2 g in sodium chloride 0.9 % 100 mL IVPB 2 g 200 mL/hr   03/25/21 0002 New Bag/Given   vancomycin (VANCOREADY) IVPB 1750 mg/350 mL 1,750 mg 175 mL/hr   03/25/21 0616 New Bag/Given   ceFEPIme (MAXIPIME) 2 g in sodium chloride 0.9 % 100 mL IVPB 2 g 200 mL/hr   03/25/21 1156 New Bag/Given   vancomycin (VANCOREADY) IVPB 1250 mg/250 mL 1,250 mg 166.7 mL/hr   03/25/21 1422 New Bag/Given   ceFEPIme (MAXIPIME) 2 g in sodium chloride 0.9 % 100 mL IVPB 2 g 200 mL/hr   03/25/21 2216 Given   metroNIDAZOLE (FLAGYL) tablet 500 mg 500 mg    03/25/21 2218 New Bag/Given   ceFEPIme (MAXIPIME) 2 g in sodium chloride 0.9 % 100 mL IVPB 2 g 200 mL/hr   03/26/21 0024 New Bag/Given   vancomycin (VANCOREADY) IVPB 1250 mg/250 mL 1,250 mg 166.7 mL/hr   03/26/21 0513 Given   metroNIDAZOLE (FLAGYL) tablet 500 mg 500 mg    03/26/21 0515 New Bag/Given   ceFEPIme (MAXIPIME) 2 g in sodium chloride 0.9 % 100 mL IVPB 2 g 200 mL/hr   03/26/21 1318 New Bag/Given   vancomycin (VANCOREADY) IVPB 1250 mg/250 mL 1,250 mg 166.7 mL/hr   03/26/21 1325 Given   metroNIDAZOLE (FLAGYL) tablet 500 mg 500 mg      . ascorbic acid  250 mg Oral BID  . carvedilol  3.125 mg Oral BID WC  . dapagliflozin propanediol  5 mg Oral Daily  . enoxaparin (LOVENOX) injection  40 mg Subcutaneous Q24H  . fluticasone  2 spray Each Nare Daily  . gabapentin  100 mg Oral QHS  . insulin aspart  0-15 Units Subcutaneous TID AC & HS  .  insulin glargine  40 Units Subcutaneous Daily  . losartan  25 mg Oral Daily  . metroNIDAZOLE  500 mg Oral Q8H  . multivitamin with minerals  1 tablet Oral Daily  . spironolactone  25 mg Oral Daily    Objective: Vital signs in last 24 hours: Temp:  [98.4 F (36.9 C)-99.5 F (37.5 C)] 98.7 F (37.1 C) (04/13 0742) Pulse Rate:  [81-98] 81 (04/13 0742) Resp:  [16-19] 16 (04/13 0742) BP: (103-116)/(69-86) 106/72 (04/13 0742) SpO2:  [97 %-100 %] 98 % (04/13 0742) Constitutional: He is oriented to person, place, and time. Disheveled HENT: anicteric  Mouth/Throat: Oropharynx is clear and moist. No oropharyngeal exudate.  Cardiovascular: Normal rate, regular rhythm and normal heart sounds. Exam reveals no gallop and no friction rub.  Pulmonary/Chest: Effort normal and breath sounds normal. No respiratory distress. He has no wheezes.  Abdominal: Soft. Bowel sounds are normal. He exhibits no distension. There is no tenderness.  Lymphadenopathy: He has no cervical adenopathy.  Neurological: He is alert and oriented to person, place, and time.  Skin: R great toe swollen to twice the size of L. Has fluctance over dorsum of toe and extending up to dorsum of foot  Some drainage from tip of toe Psychiatric: He has a normal mood and affect. His behavior is abnormal.   Lab Results Recent Labs    03/24/21 2144 03/25/21 0449  WBC 18.9* 17.6*  HGB 10.4* 10.4*  HCT 33.1* 33.5*  NA 133* 135  K 4.2 4.0  CL 100 108  CO2 26 22  BUN 8 8  CREATININE 0.91 0.68    Microbiology: Results for orders placed or performed during the hospital encounter of 03/24/21  Blood culture (routine x 2)     Status: None (Preliminary result)   Collection Time: 03/24/21 10:34 PM   Specimen: BLOOD  Result Value Ref Range Status   Specimen Description BLOOD BLOOD LEFT FOREARM  Final   Special Requests   Final    BOTTLES DRAWN AEROBIC AND ANAEROBIC Blood Culture results may not be optimal due to an excessive volume of  blood received in culture bottles   Culture   Final    NO GROWTH 2 DAYS Performed at [facility], [address] [place], [place] [address]    Report Status PENDING  Incomplete  Blood culture (routine x 2)     Status: None (Preliminary result)   Collection Time: 03/24/21 10:34 PM   Specimen: BLOOD  Result Value Ref Range Status   Specimen Description BLOOD LEFT ANTECUBITAL  Final   Special Requests   Final    BOTTLES DRAWN AEROBIC AND ANAEROBIC Blood Culture results may not be optimal due to an excessive volume of blood received in culture bottles   Culture   Final    NO GROWTH 2 DAYS Performed at [facility], [address] [place], [place] [address]    Report Status PENDING  Incomplete  SARS CORONAVIRUS 2 (TAT 6-24 HRS) Nasopharyngeal Nasopharyngeal Swab     Status: None   Collection Time: 03/24/21 10:39 PM   Specimen: Nasopharyngeal Swab  Result Value Ref Range Status   SARS Coronavirus 2 NEGATIVE NEGATIVE Final    Comment: (NOTE) SARS-CoV-2 target nucleic acids are NOT DETECTED.  The SARS-CoV-2 RNA is generally detectable in upper and lower respiratory specimens during the acute phase of infection. Negative results do not preclude SARS-CoV-2 infection, do not rule out co-infections with other pathogens, and should not be used as the sole basis for treatment or other patient management decisions. Negative results must be combined with clinical observations, patient history, and epidemiological information. The expected result is Negative.  Fact Sheet for Patients: SugarRoll.be  Fact Sheet for Healthcare Providers: https://www.woods-mathews.com/  This test is not yet approved or cleared by the [place] FDA and  has been authorized for detection and/or diagnosis of SARS-CoV-2 by FDA under an Emergency Use Authorization (EUA). This EUA will remain  in effect (meaning this test can be used) for the  duration of the COVID-19 declaration under Se ction 564(b)(1) of the Act, 21 U.S.C. section 360bbb-3(b)(1), unless the authorization is terminated or revoked sooner.  Performed at [facility], [place] [address], [place], [place] [address]     Studies/Results: MR FOOT RIGHT W WO CONTRAST  Result Date: 03/25/2021 CLINICAL DATA:  Type 2 diabetes.  Infection of the great toe. EXAM: MRI OF THE RIGHT FOREFOOT WITHOUT AND WITH CONTRAST TECHNIQUE: Multiplanar, multisequence MR imaging of the right forefoot was performed before and after the administration of intravenous contrast. CONTRAST:  47mL GADAVIST GADOBUTROL 1 MMOL/ML IV SOLN COMPARISON:  None. FINDINGS: Bones/Joint/Cartilage Severe soft tissue edema of the great toe extending into the  more proximal medial forefoot with enhancement most consistent with cellulitis. Complex fluid collection along the dorsal aspect of the great toe measuring 1.8 x 0.4 x 2.6 cm consistent with an abscess. Large skin blister along the medial aspect of the great toe. Severe bone marrow edema in the first distal phalanx and head of the first proximal phalanx most concerning for osteomyelitis. Mild osteoarthritis of the first MTP joint with subchondral reactive marrow changes. No acute fracture or dislocation. Normal alignment. No joint effusion. Ligaments Collateral ligaments are intact.  Lisfranc ligament is intact. Muscles and Tendons Flexor, peroneal and extensor compartment tendons are intact. Mild edema in the plantar musculature likely neurogenic. Soft tissue No fluid collection or hematoma.  No soft tissue mass. IMPRESSION: 1. Severe bone marrow edema in the first distal phalanx and head of the first proximal phalanx most concerning for osteomyelitis. Severe cellulitis of the great toe extending into the more proximal medial forefoot. Complex fluid collection along the dorsal aspect of the great toe measuring 1.8 x 0.4 x 2.6 cm consistent with an abscess.  Electronically Signed   By: Kathreen Devoid   On: 03/25/2021 06:24   MR TIBIA FIBULA RIGHT W WO CONTRAST  Result Date: 03/25/2021 CLINICAL DATA:  Soft tissue infection of the lower leg EXAM: MRI OF LOWER RIGHT EXTREMITY WITHOUT AND WITH CONTRAST TECHNIQUE: Multiplanar, multisequence MR imaging of the right lower leg was performed both before and after administration of intravenous contrast. CONTRAST:  109mL GADAVIST GADOBUTROL 1 MMOL/ML IV SOLN COMPARISON:  None. FINDINGS: Bones/Joint/Cartilage No marrow signal abnormality. No fracture or dislocation. Normal alignment. No joint effusion. Ligaments Collateral ligaments are intact. Muscles and Tendons Muscles are normal. Flexor, extensor, peroneal and Achilles tendons are intact. Soft tissue No fluid collection or hematoma. No soft tissue mass. Mild soft tissue edema along the medial aspect of the right lower leg with mild enhancement on postcontrast imaging concerning for mild cellulitis. No drainable fluid collection to suggest an abscess. Cystic mass along the popliteus muscle likely reflecting a ganglion cyst measuring 1.8 cm. Small Baker's cyst. IMPRESSION: 1. No osteomyelitis of the right lower leg. 2. Mild cellulitis of the right lower leg. No drainable fluid collection to suggest an abscess. Electronically Signed   By: Kathreen Devoid   On: 03/25/2021 06:36   US Venous Img Lower Unilateral Right  Result Date: 03/24/2021 CLINICAL DATA:  Right leg pain, palpable cord around femoral vein EXAM: RIGHT LOWER EXTREMITY VENOUS DOPPLER ULTRASOUND TECHNIQUE: Gray-scale sonography with compression, as well as color and duplex ultrasound, were performed to evaluate the deep venous system(s) from the level of the common femoral vein through the popliteal and proximal calf veins. COMPARISON:  03/11/2021 FINDINGS: VENOUS Normal compressibility of the common femoral, superficial femoral, and popliteal veins, as well as the visualized calf veins. Visualized portions of  profunda femoral vein and great saphenous vein unremarkable. No filling defects to suggest DVT on grayscale or color Doppler imaging. Doppler waveforms show normal direction of venous flow, normal respiratory plasticity and response to augmentation. Limited views of the contralateral common femoral vein are unremarkable. OTHER None. Limitations: none IMPRESSION: 1. No evidence of deep venous thrombosis within the right lower extremity. Electronically Signed   By: Randa Ngo M.D.   On: 03/24/2021 23:50   DG Foot Complete Right  Result Date: 03/24/2021 CLINICAL DATA:  Great toe osteomyelitis EXAM: RIGHT FOOT COMPLETE - 3+ VIEW COMPARISON:  03/11/2021 FINDINGS: Interval increase in bony erosion of the tuft of the distal phalanx  right great toe. No other bony abnormality. No fracture. Joint spaces are preserved. Soft tissue edema about the great toe. IMPRESSION: 1. Interval increase in bony erosion of the tuft of the distal phalanx right great toe, consistent with osteomyelitis. 2. Soft tissue edema about the great toe. Electronically Signed   By: Eddie Candle M.D.   On: 03/24/2021 22:54    Assessment/Plan: Joshua [name] is a 50 y.o. male with multiple medical issues including poorly controlled DM (A1c > 11), homelessness, CAD, CHF, polysubstance abuse (Cocaine and THC) who had developed a wound on distal R toe after pulling on a skin scab (photo from 3/4) During early admission in March seen by podiatry and rec distal toe amputation but pt refused. He was rec to take augmentin but tells me he did not take. Readmitted late March with CP (Cocaine +) and PNA and again podiatry rec amputation but pt refused. He did start augmentin as otpt.  Now readmitted with fever, leukocytosis, MRI showing osteo of distal and prox phalnax as well as abscess over dorsal toe.  Only prior cx 3/4 mixed but also had rare  Enterobacter cloacae (No Staph aureus or Pseudomonas noted).  Started on vanco and cefepime.  Podiatry rec toe amputation at this point but pt refuses again. I had a very long discussion with patient regarding the infection.  He is under the impression that the augmentin he was on caused the wound to worsen (although he tells me he did not take after first dc and took for a few days after second admit).  He tells me he has always had listed in his chart that he was allergic to penicillin but cannot tell me what the reaction was. Of note the prior notes suggest he did have gradual intro to amox at initial hospitalization and had no rash, or other signs of allergic reaction. Also has no eosinophilia.  He also tells me everyone he knows (100%) who had toe amputation went onto have total leg amputation. I spent significant time explaining to him that despite what has happened in past or to his friends, despite his perception that augmentin made the toe worse, we are faced with a potentially limb and life threatening infection. He needs IV abx and surgery. I reiterated this to him and advised risk of sepsis without surgical I and D esp with the abscess.  4/13- some drainage now from tip of toe. Thinks the pain up his leg is improving  Recommendations Cont cefepime and vanco and flagyl Cultured drainage from tip of toe. Apply warm compress to try to get to drain. Needs amputation of toe but he has refused. Would continue to work with him to see if we can convince him of the urgency of this situation. Will need much better DM control as well. Thank you very much for the consult. Will follow with you.  Leonel Ramsay   03/26/2021, 1:48 PM

## 2021-03-26 NOTE — Progress Notes (Signed)
Dr Ola Spurr swabbed toe with incorrect container, RN collected sample, in blue tube, per lab and sent another sample. MD notified. Great right toe was then cleaned and covered with gauze and kerlix.

## 2021-03-26 NOTE — Plan of Care (Signed)
Patients on sliding scale and educated on care plan

## 2021-03-27 ENCOUNTER — Encounter: Payer: Self-pay | Admitting: Family Medicine

## 2021-03-27 DIAGNOSIS — L089 Local infection of the skin and subcutaneous tissue, unspecified: Secondary | ICD-10-CM

## 2021-03-27 DIAGNOSIS — M86171 Other acute osteomyelitis, right ankle and foot: Secondary | ICD-10-CM | POA: Diagnosis not present

## 2021-03-27 HISTORY — DX: Local infection of the skin and subcutaneous tissue, unspecified: L08.9

## 2021-03-27 LAB — CBC WITH DIFFERENTIAL/PLATELET
Abs Immature Granulocytes: 0.06 10*3/uL (ref 0.00–0.07)
Basophils Absolute: 0.1 10*3/uL (ref 0.0–0.1)
Basophils Relative: 1 %
Eosinophils Absolute: 0.8 10*3/uL — ABNORMAL HIGH (ref 0.0–0.5)
Eosinophils Relative: 9 %
HCT: 30.3 % — ABNORMAL LOW (ref 39.0–52.0)
Hemoglobin: 9.3 g/dL — ABNORMAL LOW (ref 13.0–17.0)
Immature Granulocytes: 1 %
Lymphocytes Relative: 25 %
Lymphs Abs: 2.3 10*3/uL (ref 0.7–4.0)
MCH: 26.1 pg (ref 26.0–34.0)
MCHC: 30.7 g/dL (ref 30.0–36.0)
MCV: 84.9 fL (ref 80.0–100.0)
Monocytes Absolute: 0.7 10*3/uL (ref 0.1–1.0)
Monocytes Relative: 8 %
Neutro Abs: 5.1 10*3/uL (ref 1.7–7.7)
Neutrophils Relative %: 56 %
Platelets: 413 10*3/uL — ABNORMAL HIGH (ref 150–400)
RBC: 3.57 MIL/uL — ABNORMAL LOW (ref 4.22–5.81)
RDW: 14.7 % (ref 11.5–15.5)
WBC: 8.9 10*3/uL (ref 4.0–10.5)
nRBC: 0 % (ref 0.0–0.2)

## 2021-03-27 LAB — BASIC METABOLIC PANEL
Anion gap: 8 (ref 5–15)
BUN: 19 mg/dL (ref 6–20)
CO2: 23 mmol/L (ref 22–32)
Calcium: 8 mg/dL — ABNORMAL LOW (ref 8.9–10.3)
Chloride: 107 mmol/L (ref 98–111)
Creatinine, Ser: 1 mg/dL (ref 0.61–1.24)
GFR, Estimated: >60 mL/min (ref >60)
Glucose, Bld: 207 mg/dL — ABNORMAL HIGH (ref 70–99)
Potassium: 4.3 mmol/L (ref 3.5–5.1)
Sodium: 138 mmol/L (ref 135–145)

## 2021-03-27 LAB — GLUCOSE, CAPILLARY
Glucose-Capillary: 228 mg/dL — ABNORMAL HIGH (ref 70–99)
Glucose-Capillary: 111 mg/dL — ABNORMAL HIGH (ref 70–99)
Glucose-Capillary: 148 mg/dL — ABNORMAL HIGH (ref 70–99)
Glucose-Capillary: 151 mg/dL — ABNORMAL HIGH (ref 70–99)

## 2021-03-27 MED ORDER — INSULIN GLARGINE 100 UNIT/ML ~~LOC~~ SOLN
43.0000 [IU] | Freq: Every day | SUBCUTANEOUS | Status: DC
  Administered 2021-03-27 – 2021-03-28 (×2): 43 [IU] via SUBCUTANEOUS
  Filled 2021-03-27 (×3): qty 0.43

## 2021-03-27 NOTE — Progress Notes (Signed)
Progress Note    Joshua [name]  DJM:[id number] DOB: Jun 01, 1971  DOA: 03/24/2021 PCP: Patient, No Pcp Per (Inactive)      Brief Narrative:    Medical records reviewed and are as summarized below:  Larry Valentine Barney is a 50 y.o. male with medical history significant for chronic systolic and diastolic CHF, type 2 diabetes mellitus, hypertension,Cocaine abuse, CAD, tobacco use disorder, chronic anemia, recent hospitalization for sepsis secondary to multifocal pneumonia and osteomyelitis of the right great toe.  He had been seen by podiatrist in the past and amputation of the right big toe had been recommended.  However, patient had refused to have amputation done and preferred to treat right big toe osteomyelitis with antibiotics.    He presented to the hospital because of pain and swelling extending from the right big toe through the foot to the right lower leg.  He also complained of redness and warmth of the right foot and right leg.  He was admitted to the hospital for sepsis secondary to acute on chronic osteomyelitis of the right big toe and right lower extremity cellulitis.      Assessment/Plan:   Active Problems:   Acute osteomyelitis of toe, right (HCC)    Body mass index is 26.1 kg/m.   Sepsis secondary to acute on chronic osteomyelitis of right big toe with cellulitis of right leg and foot: Continue current antibiotics.  Patient has been evaluated by the podiatrist.  Amputation of the right big toe was recommended.  He still refuses to have amputation of the right big toe.  He has been informed that he can seek a second opinion with another podiatrist in another hospital.  He still has not made up his mind for any surgery.  He is hoping the wound culture from the right big toe will be negative.  However, I explained that negative wound culture does not exclude osteomyelitis of the right big toe.  Follow-up with ID specialist for further  recommendations.  Hypertension: Continue antihypertensives  Type II DM with peripheral neuropathy: Increase Lantus from 40 units to 43 units daily.  Continue NovoLog as needed for hyperglycemia.  Monitor glucose levels closely.   Chronic systolic and diastolic CHF: Continue carvedilol, losartan and dapagliflozin. 2D echo on 07/16/2020 showed EF estimated at 25 to 30%,  grade 3 diastolic dysfunction, moderate MR  Diet Order            Diet heart healthy/carb modified Room service appropriate? Yes; Fluid consistency: Thin  Diet effective now                    Consultants:  Podiatrist  Infectious disease specialist  Procedures:  None    Medications:   . ascorbic acid  250 mg Oral BID  . carvedilol  3.125 mg Oral BID WC  . dapagliflozin propanediol  5 mg Oral Daily  . enoxaparin (LOVENOX) injection  40 mg Subcutaneous Q24H  . fluticasone  2 spray Each Nare Daily  . gabapentin  100 mg Oral QHS  . insulin aspart  0-15 Units Subcutaneous TID AC & HS  . insulin glargine  43 Units Subcutaneous Daily  . losartan  25 mg Oral Daily  . metroNIDAZOLE  500 mg Oral Q8H  . multivitamin with minerals  1 tablet Oral Daily  . spironolactone  25 mg Oral Daily   Continuous Infusions: . sodium chloride 100 mL/hr at 03/27/21 0835  . ceFEPime (MAXIPIME) IV 2 g (03/27/21 1500)  .  vancomycin 1,250 mg (03/27/21 1315)     Anti-infectives (From admission, onward)   Start     Dose/Rate Route Frequency Ordered Stop   03/25/21 2200  metroNIDAZOLE (FLAGYL) tablet 500 mg        500 mg Oral Every 8 hours 03/25/21 1839     03/25/21 1200  vancomycin (VANCOREADY) IVPB 1250 mg/250 mL        1,250 mg 166.7 mL/hr over 90 Minutes Intravenous Every 12 hours 03/25/21 0057     03/25/21 0600  ceFEPIme (MAXIPIME) 2 g in sodium chloride 0.9 % 100 mL IVPB        2 g 200 mL/hr over 30 Minutes Intravenous Every 8 hours 03/25/21 0054     03/24/21 2345  vancomycin (VANCOCIN) IVPB 1000 mg/200 mL premix   Status:  Discontinued        1,000 mg 200 mL/hr over 60 Minutes Intravenous  Once 03/24/21 2331 03/24/21 2349   03/24/21 2345  ceFEPIme (MAXIPIME) 2 g in sodium chloride 0.9 % 100 mL IVPB  Status:  Discontinued        2 g 200 mL/hr over 30 Minutes Intravenous  Once 03/24/21 2331 03/25/21 0049   03/24/21 2300  vancomycin (VANCOREADY) IVPB 1750 mg/350 mL        1,750 mg 175 mL/hr over 120 Minutes Intravenous  Once 03/24/21 2256 03/25/21 0208   03/24/21 2245  vancomycin (VANCOCIN) injection 1,750 mg  Status:  Discontinued        1,750 mg Intravenous  Once 03/24/21 2221 03/24/21 2255   03/24/21 2230  ceFEPIme (MAXIPIME) 2 g in sodium chloride 0.9 % 100 mL IVPB        2 g 200 mL/hr over 30 Minutes Intravenous  Once 03/24/21 2221 03/24/21 2359             Family Communication/Anticipated D/C date and plan/Code Status   DVT prophylaxis: enoxaparin (LOVENOX) injection 40 mg Start: 03/25/21 1600 SCDs Start: 03/24/21 2330     Code Status: Full Code  Family Communication: None Disposition Plan:    Status is: Inpatient  Remains inpatient appropriate because:IV treatments appropriate due to intensity of illness or inability to take PO and Inpatient level of care appropriate due to severity of illness   Dispo: The patient is from: Home              Anticipated d/c is to: Home              Patient currently is not medically stable to d/c.   Difficult to place patient No           Subjective:   He complains of pain in the right big toe, right foot and right leg.  Objective:    Vitals:   03/27/21 0024 03/27/21 0410 03/27/21 0749 03/27/21 1728  BP: 96/65 101/88 104/63 106/76  Pulse: 95 86 74 74  Resp: 16 16 16 16   Temp: 99.1 F (37.3 C) 98.6 F (37 C) 98 F (36.7 C) (!) 97.4 F (36.3 C)  TempSrc:    Oral  SpO2: 100% 99% 100% 100%  Weight:      Height:       No data found.   Intake/Output Summary (Last 24 hours) at 03/27/2021 1737 Last data filed at  03/27/2021 1345 Gross per 24 hour  Intake 360 ml  Output 2275 ml  Net -1915 ml   Filed Weights   03/24/21 2140  Weight: 82.5 kg    Exam:  GEN: NAD SKIN: No rash EYES: EOMI ENT: MMM CV: RRR PULM: CTA B ABD: soft, ND, NT, +BS CNS: AAO x 3, non focal EXT: Tenderness in the right big toe, right foot and right leg.  No edema.         Data Reviewed:   I have personally reviewed following labs and imaging studies:  Labs: Labs show the following:   Basic Metabolic Panel: Recent Labs  Lab 03/24/21 2144 03/25/21 0449 03/27/21 0451  NA 133* 135 138  K 4.2 4.0 4.3  CL 100 108 107  CO2 26 22 23   GLUCOSE 190* 157* 207*  BUN 8 8 19   CREATININE 0.91 0.68 1.00  CALCIUM 8.4* 7.7* 8.0*   GFR Estimated Creatinine Clearance: 92.3 mL/min (by C-G formula based on SCr of 1 mg/dL). Liver Function Tests: Recent Labs  Lab 03/24/21 2144  AST 15  ALT 10  ALKPHOS 63  BILITOT 0.8  PROT 7.2  ALBUMIN 3.3*   No results for input(s): LIPASE, AMYLASE in the last 168 hours. No results for input(s): AMMONIA in the last 168 hours. Coagulation profile No results for input(s): INR, PROTIME in the last 168 hours.  CBC: Recent Labs  Lab 03/24/21 2144 03/25/21 0449 03/27/21 0451  WBC 18.9* 17.6* 8.9  NEUTROABS 15.2*  --  5.1  HGB 10.4* 10.4* 9.3*  HCT 33.1* 33.5* 30.3*  MCV 83.6 84.0 84.9  PLT 424* 415* 413*   Cardiac Enzymes: No results for input(s): CKTOTAL, CKMB, CKMBINDEX, TROPONINI in the last 168 hours. BNP (last 3 results) No results for input(s): PROBNP in the last 8760 hours. CBG: Recent Labs  Lab 03/26/21 1629 03/26/21 2053 03/27/21 0753 03/27/21 1126 03/27/21 1606  GLUCAP 210* 226* 228* 111* 148*   D-Dimer: No results for input(s): DDIMER in the last 72 hours. Hgb A1c: No results for input(s): HGBA1C in the last 72 hours. Lipid Profile: No results for input(s): CHOL, HDL, LDLCALC, TRIG, CHOLHDL, LDLDIRECT in the last 72 hours. Thyroid function  studies: No results for input(s): TSH, T4TOTAL, T3FREE, THYROIDAB in the last 72 hours.  Invalid input(s): FREET3 Anemia work up: No results for input(s): VITAMINB12, FOLATE, FERRITIN, TIBC, IRON, RETICCTPCT in the last 72 hours. Sepsis Labs: Recent Labs  Lab 03/24/21 2144 03/24/21 2234 03/25/21 0013 03/25/21 0449 03/27/21 0451  WBC 18.9*  --   --  17.6* 8.9  LATICACIDVEN  --  1.1 1.6  --   --     Microbiology Recent Results (from the past 240 hour(s))  Blood culture (routine x 2)     Status: None (Preliminary result)   Collection Time: 03/24/21 10:34 PM   Specimen: BLOOD  Result Value Ref Range Status   Specimen Description BLOOD BLOOD LEFT FOREARM  Final   Special Requests   Final    BOTTLES DRAWN AEROBIC AND ANAEROBIC Blood Culture results may not be optimal due to an excessive volume of blood received in culture bottles   Culture   Final    NO GROWTH 3 DAYS Performed at [facility], [address] [place], [place] [address]    Report Status PENDING  Incomplete  Blood culture (routine x 2)     Status: None (Preliminary result)   Collection Time: 03/24/21 10:34 PM   Specimen: BLOOD  Result Value Ref Range Status   Specimen Description BLOOD LEFT ANTECUBITAL  Final   Special Requests   Final    BOTTLES DRAWN AEROBIC AND ANAEROBIC Blood Culture results may not be optimal  due to an excessive volume of blood received in culture bottles   Culture   Final    NO GROWTH 3 DAYS Performed at [facility], [address] [place], [place] [address]    Report Status PENDING  Incomplete  SARS CORONAVIRUS 2 (TAT 6-24 HRS) Nasopharyngeal Nasopharyngeal Swab     Status: None   Collection Time: 03/24/21 10:39 PM   Specimen: Nasopharyngeal Swab  Result Value Ref Range Status   SARS Coronavirus 2 NEGATIVE NEGATIVE Final    Comment: (NOTE) SARS-CoV-2 target nucleic acids are NOT DETECTED.  The SARS-CoV-2 RNA is generally detectable in upper and  lower respiratory specimens during the acute phase of infection. Negative results do not preclude SARS-CoV-2 infection, do not rule out co-infections with other pathogens, and should not be used as the sole basis for treatment or other patient management decisions. Negative results must be combined with clinical observations, patient history, and epidemiological information. The expected result is Negative.  Fact Sheet for Patients: SugarRoll.be  Fact Sheet for Healthcare Providers: https://www.woods-mathews.com/  This test is not yet approved or cleared by the [place] FDA and  has been authorized for detection and/or diagnosis of SARS-CoV-2 by FDA under an Emergency Use Authorization (EUA). This EUA will remain  in effect (meaning this test can be used) for the duration of the COVID-19 declaration under Se ction 564(b)(1) of the Act, 21 U.S.C. section 360bbb-3(b)(1), unless the authorization is terminated or revoked sooner.  Performed at [facility], [place] [address], [place], [place] [address]   Aerobic Culture w Gram Stain (superficial specimen)     Status: None (Preliminary result)   Collection Time: 03/26/21  5:00 PM   Specimen: Wound  Result Value Ref Range Status   Specimen Description   Final    WOUND Performed at [facility], [address], [place], [place] [address]    Special Requests   Final    TOE Performed at [facility], [address] [place], [place] [address]    Gram Stain   Final    FEW WBC PRESENT,BOTH PMN AND MONONUCLEAR RARE GRAM POSITIVE COCCI    Culture   Final    CULTURE REINCUBATED FOR BETTER GROWTH Performed at [facility], [place] [address], [place], [place] [address]    Report Status PENDING  Incomplete    Procedures and diagnostic studies:  No results found.             LOS: 3 days   Elieser Tetrick  [facility]   Pager on [email or web address].  If 7PM-7AM, please contact night-coverage at [email or web address]     03/27/2021, 5:37 PM

## 2021-03-28 DIAGNOSIS — M86171 Other acute osteomyelitis, right ankle and foot: Secondary | ICD-10-CM | POA: Diagnosis not present

## 2021-03-28 LAB — C-REACTIVE PROTEIN: CRP: 5.2 mg/dL — ABNORMAL HIGH (ref <1.0)

## 2021-03-28 LAB — CREATININE, SERUM
Creatinine, Ser: 0.93 mg/dL (ref 0.61–1.24)
GFR, Estimated: >60 mL/min (ref >60)

## 2021-03-28 LAB — GLUCOSE, CAPILLARY
Glucose-Capillary: 111 mg/dL — ABNORMAL HIGH (ref 70–99)
Glucose-Capillary: 117 mg/dL — ABNORMAL HIGH (ref 70–99)
Glucose-Capillary: 154 mg/dL — ABNORMAL HIGH (ref 70–99)
Glucose-Capillary: 152 mg/dL — ABNORMAL HIGH (ref 70–99)

## 2021-03-28 NOTE — Progress Notes (Signed)
Progress Note    Joshua [name]  ONG:[id number] DOB: Sep 09, 1971  DOA: 03/24/2021 PCP: Patient, No Pcp Per (Inactive)      Brief Narrative:    Medical records reviewed and are as summarized below:  Joshua [name] is a 50 y.o. male with medical history significant for chronic systolic and diastolic CHF, type 2 diabetes mellitus, hypertension,Cocaine abuse, CAD, tobacco use disorder, chronic anemia, recent hospitalization for sepsis secondary to multifocal pneumonia and osteomyelitis of the right great toe.  He had been seen by podiatrist in the past and amputation of the right big toe had been recommended.  However, patient had refused to have amputation done and preferred to treat right big toe osteomyelitis with antibiotics.    He presented to the hospital because of pain and swelling extending from the right big toe through the foot to the right lower leg.  He also complained of redness and warmth of the right foot and right leg.  He was admitted to the hospital for sepsis secondary to acute on chronic osteomyelitis of the right big toe and right lower extremity cellulitis.      Assessment/Plan:   Active Problems:   Acute osteomyelitis of toe, right (HCC)    Body mass index is 26.1 kg/m.   Sepsis secondary to acute on chronic osteomyelitis of right big toe with cellulitis of right leg and foot: Continue current antibiotics.  Patient has been evaluated by the podiatrist.  Amputation of the right big toe was recommended. Wound culture from the right big toe is growing staph aureus. He said he still not ready for amputation of the right big toe.  He said he would discuss it with his "streets guys".  He told the ID specialist, Dr. Ola Spurr, that he wants repeat MRI of the right big toe on Monday, 03/31/2021.  If the MRI still shows infection then he will strongly consider amputation.  Hypertension: Continue antihypertensives  Type II DM with peripheral  neuropathy: Continue Lantus at 43 units daily.  Continue NovoLog as needed for hyperglycemia.  Monitor glucose levels closely.   Chronic systolic and diastolic CHF: Continue carvedilol, losartan and dapagliflozin. 2D echo on 07/16/2020 showed EF estimated at 25 to 30%,  grade 3 diastolic dysfunction, moderate MR  Diet Order            Diet heart healthy/carb modified Room service appropriate? Yes; Fluid consistency: Thin  Diet effective now                    Consultants:  Podiatrist  Infectious disease specialist  Procedures:  None    Medications:   . ascorbic acid  250 mg Oral BID  . carvedilol  3.125 mg Oral BID WC  . dapagliflozin propanediol  5 mg Oral Daily  . enoxaparin (LOVENOX) injection  40 mg Subcutaneous Q24H  . fluticasone  2 spray Each Nare Daily  . gabapentin  100 mg Oral QHS  . insulin aspart  0-15 Units Subcutaneous TID AC & HS  . insulin glargine  43 Units Subcutaneous Daily  . losartan  25 mg Oral Daily  . metroNIDAZOLE  500 mg Oral Q8H  . multivitamin with minerals  1 tablet Oral Daily  . spironolactone  25 mg Oral Daily   Continuous Infusions: . sodium chloride 100 mL/hr at 03/28/21 0512  . ceFEPime (MAXIPIME) IV 2 g (03/28/21 1421)  . vancomycin 1,250 mg (03/28/21 1209)     Anti-infectives (From admission, onward)  Start     Dose/Rate Route Frequency Ordered Stop   03/25/21 2200  metroNIDAZOLE (FLAGYL) tablet 500 mg        500 mg Oral Every 8 hours 03/25/21 1839     03/25/21 1200  vancomycin (VANCOREADY) IVPB 1250 mg/250 mL        1,250 mg 166.7 mL/hr over 90 Minutes Intravenous Every 12 hours 03/25/21 0057     03/25/21 0600  ceFEPIme (MAXIPIME) 2 g in sodium chloride 0.9 % 100 mL IVPB        2 g 200 mL/hr over 30 Minutes Intravenous Every 8 hours 03/25/21 0054     03/24/21 2345  vancomycin (VANCOCIN) IVPB 1000 mg/200 mL premix  Status:  Discontinued        1,000 mg 200 mL/hr over 60 Minutes Intravenous  Once 03/24/21 2331 03/24/21  2349   03/24/21 2345  ceFEPIme (MAXIPIME) 2 g in sodium chloride 0.9 % 100 mL IVPB  Status:  Discontinued        2 g 200 mL/hr over 30 Minutes Intravenous  Once 03/24/21 2331 03/25/21 0049   03/24/21 2300  vancomycin (VANCOREADY) IVPB 1750 mg/350 mL        1,750 mg 175 mL/hr over 120 Minutes Intravenous  Once 03/24/21 2256 03/25/21 0208   03/24/21 2245  vancomycin (VANCOCIN) injection 1,750 mg  Status:  Discontinued        1,750 mg Intravenous  Once 03/24/21 2221 03/24/21 2255   03/24/21 2230  ceFEPIme (MAXIPIME) 2 g in sodium chloride 0.9 % 100 mL IVPB        2 g 200 mL/hr over 30 Minutes Intravenous  Once 03/24/21 2221 03/24/21 2359             Family Communication/Anticipated D/C date and plan/Code Status   DVT prophylaxis: enoxaparin (LOVENOX) injection 40 mg Start: 03/25/21 1600 SCDs Start: 03/24/21 2330     Code Status: Full Code  Family Communication: None Disposition Plan:    Status is: Inpatient  Remains inpatient appropriate because:IV treatments appropriate due to intensity of illness or inability to take PO and Inpatient level of care appropriate due to severity of illness   Dispo: The patient is from: Home              Anticipated d/c is to: Home              Patient currently is not medically stable to d/c.   Difficult to place patient No           Subjective:   He still complains of pain in the right big toe, right foot and distal right leg.  Objective:    Vitals:   03/28/21 0325 03/28/21 0804 03/28/21 1125 03/28/21 1506  BP: 104/75 103/68 (!) 96/56 108/78  Pulse: 77 69 68 74  Resp: 17 17 16 16   Temp: 97.8 F (36.6 C) 97.6 F (36.4 C) 98.7 F (37.1 C) 98.6 F (37 C)  TempSrc:      SpO2: 100% 100% 98% 100%  Weight:      Height:       No data found.   Intake/Output Summary (Last 24 hours) at 03/28/2021 1600 Last data filed at 03/28/2021 1350 Gross per 24 hour  Intake 1480.05 ml  Output 2630 ml  Net -1149.95 ml   Filed  Weights   03/24/21 2140  Weight: 82.5 kg    Exam:  GEN: NAD SKIN: No rash EYES: EOMI ENT: MMM CV: RRR PULM: CTA  B ABD: soft, ND, NT, +BS CNS: AAO x 3, non focal EXT: Tenderness in the right big toe, right foot and distal right leg         Data Reviewed:   I have personally reviewed following labs and imaging studies:  Labs: Labs show the following:   Basic Metabolic Panel: Recent Labs  Lab 03/24/21 2144 03/25/21 0449 03/27/21 0451 03/28/21 0607  NA 133* 135 138  --   K 4.2 4.0 4.3  --   CL 100 108 107  --   CO2 26 22 23   --   GLUCOSE 190* 157* 207*  --   BUN 8 8 19   --   CREATININE 0.91 0.68 1.00 0.93  CALCIUM 8.4* 7.7* 8.0*  --    GFR Estimated Creatinine Clearance: 99.2 mL/min (by C-G formula based on SCr of 0.93 mg/dL). Liver Function Tests: Recent Labs  Lab 03/24/21 2144  AST 15  ALT 10  ALKPHOS 63  BILITOT 0.8  PROT 7.2  ALBUMIN 3.3*   No results for input(s): LIPASE, AMYLASE in the last 168 hours. No results for input(s): AMMONIA in the last 168 hours. Coagulation profile No results for input(s): INR, PROTIME in the last 168 hours.  CBC: Recent Labs  Lab 03/24/21 2144 03/25/21 0449 03/27/21 0451  WBC 18.9* 17.6* 8.9  NEUTROABS 15.2*  --  5.1  HGB 10.4* 10.4* 9.3*  HCT 33.1* 33.5* 30.3*  MCV 83.6 84.0 84.9  PLT 424* 415* 413*   Cardiac Enzymes: No results for input(s): CKTOTAL, CKMB, CKMBINDEX, TROPONINI in the last 168 hours. BNP (last 3 results) No results for input(s): PROBNP in the last 8760 hours. CBG: Recent Labs  Lab 03/27/21 1126 03/27/21 1606 03/27/21 2133 03/28/21 0809 03/28/21 1126  GLUCAP 111* 148* 151* 111* 117*   D-Dimer: No results for input(s): DDIMER in the last 72 hours. Hgb A1c: No results for input(s): HGBA1C in the last 72 hours. Lipid Profile: No results for input(s): CHOL, HDL, LDLCALC, TRIG, CHOLHDL, LDLDIRECT in the last 72 hours. Thyroid function studies: No results for input(s): TSH,  T4TOTAL, T3FREE, THYROIDAB in the last 72 hours.  Invalid input(s): FREET3 Anemia work up: No results for input(s): VITAMINB12, FOLATE, FERRITIN, TIBC, IRON, RETICCTPCT in the last 72 hours. Sepsis Labs: Recent Labs  Lab 03/24/21 2144 03/24/21 2234 03/25/21 0013 03/25/21 0449 03/27/21 0451  WBC 18.9*  --   --  17.6* 8.9  LATICACIDVEN  --  1.1 1.6  --   --     Microbiology Recent Results (from the past 240 hour(s))  Blood culture (routine x 2)     Status: None (Preliminary result)   Collection Time: 03/24/21 10:34 PM   Specimen: BLOOD  Result Value Ref Range Status   Specimen Description BLOOD BLOOD LEFT FOREARM  Final   Special Requests   Final    BOTTLES DRAWN AEROBIC AND ANAEROBIC Blood Culture results may not be optimal due to an excessive volume of blood received in culture bottles   Culture   Final    NO GROWTH 4 DAYS Performed at [facility], [address] [place], [place] [address]    Report Status PENDING  Incomplete  Blood culture (routine x 2)     Status: None (Preliminary result)   Collection Time: 03/24/21 10:34 PM   Specimen: BLOOD  Result Value Ref Range Status   Specimen Description BLOOD LEFT ANTECUBITAL  Final   Special Requests   Final    BOTTLES DRAWN AEROBIC  AND ANAEROBIC Blood Culture results may not be optimal due to an excessive volume of blood received in culture bottles   Culture   Final    NO GROWTH 4 DAYS Performed at [facility], [address] [place], [place] [address]    Report Status PENDING  Incomplete  SARS CORONAVIRUS 2 (TAT 6-24 HRS) Nasopharyngeal Nasopharyngeal Swab     Status: None   Collection Time: 03/24/21 10:39 PM   Specimen: Nasopharyngeal Swab  Result Value Ref Range Status   SARS Coronavirus 2 NEGATIVE NEGATIVE Final    Comment: (NOTE) SARS-CoV-2 target nucleic acids are NOT DETECTED.  The SARS-CoV-2 RNA is generally detectable in upper and lower respiratory specimens during the acute phase of  infection. Negative results do not preclude SARS-CoV-2 infection, do not rule out co-infections with other pathogens, and should not be used as the sole basis for treatment or other patient management decisions. Negative results must be combined with clinical observations, patient history, and epidemiological information. The expected result is Negative.  Fact Sheet for Patients: SugarRoll.be  Fact Sheet for Healthcare Providers: https://www.woods-mathews.com/  This test is not yet approved or cleared by the [place] FDA and  has been authorized for detection and/or diagnosis of SARS-CoV-2 by FDA under an Emergency Use Authorization (EUA). This EUA will remain  in effect (meaning this test can be used) for the duration of the COVID-19 declaration under Se ction 564(b)(1) of the Act, 21 U.S.C. section 360bbb-3(b)(1), unless the authorization is terminated or revoked sooner.  Performed at [facility], [place] [address], [place], [place] [address]   Aerobic Culture w Gram Stain (superficial specimen)     Status: None (Preliminary result)   Collection Time: 03/26/21  5:00 PM   Specimen: Wound  Result Value Ref Range Status   Specimen Description   Final    WOUND Performed at [facility], [address], [place], [place] [address]    Special Requests   Final    TOE Performed at [facility], [place], [place] [address]    Gram Stain   Final    FEW WBC PRESENT,BOTH PMN AND MONONUCLEAR RARE GRAM POSITIVE COCCI    Culture   Final    RARE STAPHYLOCOCCUS AUREUS SUSCEPTIBILITIES TO FOLLOW Performed at [facility], [place] [address], [place], [place] [address]    Report Status PENDING  Incomplete    Procedures and diagnostic studies:  No results found.             LOS: 4 days   Ericia Moxley  [facility]   Pager on [email or web address]. If 7PM-7AM, please contact  night-coverage at [email or web address]     03/28/2021, 4:00 PM

## 2021-03-28 NOTE — Progress Notes (Signed)
[facility] INFECTIOUS [facility] PROGRESS NOTE Date of Admission:  03/24/2021     ID: Joshua [name] is a 50 y.o. male with DFI and osteomyeltiis Active Problems:   Acute osteomyelitis of toe, right (HCC)   Subjective: No fevers,still with some toe pain. Says still some foot pain.   ROS  Eleven systems are reviewed and negative except per hpi  Medications:  Antibiotics Given (last 72 hours)    Date/Time Action Medication Dose Rate   03/25/21 1422 New Bag/Given   ceFEPIme (MAXIPIME) 2 g in sodium chloride 0.9 % 100 mL IVPB 2 g 200 mL/hr   03/25/21 2216 Given   metroNIDAZOLE (FLAGYL) tablet 500 mg 500 mg    03/25/21 2218 New Bag/Given   ceFEPIme (MAXIPIME) 2 g in sodium chloride 0.9 % 100 mL IVPB 2 g 200 mL/hr   03/26/21 0024 New Bag/Given   vancomycin (VANCOREADY) IVPB 1250 mg/250 mL 1,250 mg 166.7 mL/hr   03/26/21 0513 Given   metroNIDAZOLE (FLAGYL) tablet 500 mg 500 mg    03/26/21 0515 New Bag/Given   ceFEPIme (MAXIPIME) 2 g in sodium chloride 0.9 % 100 mL IVPB 2 g 200 mL/hr   03/26/21 1318 New Bag/Given   vancomycin (VANCOREADY) IVPB 1250 mg/250 mL 1,250 mg 166.7 mL/hr   03/26/21 1325 Given   metroNIDAZOLE (FLAGYL) tablet 500 mg 500 mg    03/26/21 1727 New Bag/Given   ceFEPIme (MAXIPIME) 2 g in sodium chloride 0.9 % 100 mL IVPB 2 g 200 mL/hr   03/26/21 2131 New Bag/Given   ceFEPIme (MAXIPIME) 2 g in sodium chloride 0.9 % 100 mL IVPB 2 g 200 mL/hr   03/26/21 2134 Given   metroNIDAZOLE (FLAGYL) tablet 500 mg 500 mg    03/27/21 0155 New Bag/Given   vancomycin (VANCOREADY) IVPB 1250 mg/250 mL 1,250 mg 166.7 mL/hr   03/27/21 9935 Given   metroNIDAZOLE (FLAGYL) tablet 500 mg 500 mg    03/27/21 7017 New Bag/Given   ceFEPIme (MAXIPIME) 2 g in sodium chloride 0.9 % 100 mL IVPB 2 g 200 mL/hr   03/27/21 1313 Given   metroNIDAZOLE (FLAGYL) tablet 500 mg 500 mg    03/27/21 1315 New Bag/Given   vancomycin (VANCOREADY) IVPB 1250 mg/250 mL 1,250 mg 166.7 mL/hr   03/27/21  1500 New Bag/Given   ceFEPIme (MAXIPIME) 2 g in sodium chloride 0.9 % 100 mL IVPB 2 g 200 mL/hr   03/27/21 2129 Given   metroNIDAZOLE (FLAGYL) tablet 500 mg 500 mg    03/27/21 2142 New Bag/Given   ceFEPIme (MAXIPIME) 2 g in sodium chloride 0.9 % 100 mL IVPB 2 g 200 mL/hr   03/27/21 2318 New Bag/Given   vancomycin (VANCOREADY) IVPB 1250 mg/250 mL 1,250 mg 166.7 mL/hr   03/28/21 0506 Given   metroNIDAZOLE (FLAGYL) tablet 500 mg 500 mg    03/28/21 0518 New Bag/Given   ceFEPIme (MAXIPIME) 2 g in sodium chloride 0.9 % 100 mL IVPB 2 g 200 mL/hr   03/28/21 1209 New Bag/Given   vancomycin (VANCOREADY) IVPB 1250 mg/250 mL 1,250 mg 166.7 mL/hr     . ascorbic acid  250 mg Oral BID  . carvedilol  3.125 mg Oral BID WC  . dapagliflozin propanediol  5 mg Oral Daily  . enoxaparin (LOVENOX) injection  40 mg Subcutaneous Q24H  . fluticasone  2 spray Each Nare Daily  . gabapentin  100 mg Oral QHS  . insulin aspart  0-15 Units Subcutaneous TID AC & HS  . insulin glargine  43 Units Subcutaneous Daily  . losartan  25 mg Oral Daily  . metroNIDAZOLE  500 mg Oral Q8H  . multivitamin with minerals  1 tablet Oral Daily  . spironolactone  25 mg Oral Daily    Objective: Vital signs in last 24 hours: Temp:  [97.4 F (36.3 C)-98.7 F (37.1 C)] 98.7 F (37.1 C) (04/15 1125) Pulse Rate:  [68-87] 68 (04/15 1125) Resp:  [16-18] 16 (04/15 1125) BP: (96-118)/(56-76) 96/56 (04/15 1125) SpO2:  [98 %-100 %] 98 % (04/15 1125) Constitutional: He is oriented to person, place, and time. Disheveled HENT: anicteric  Mouth/Throat: Oropharynx is clear and moist. No oropharyngeal exudate.  Cardiovascular: Normal rate, regular rhythm and normal heart sounds. Exam reveals no gallop and no friction rub.  Pulmonary/Chest: Effort normal and breath sounds normal. No respiratory distress. He has no wheezes.  Abdominal: Soft. Bowel sounds are normal. He exhibits no distension. There is no tenderness.  Lymphadenopathy: He has no  cervical adenopathy.  Neurological: He is alert and oriented to person, place, and time.  Skin: R great toe swollen to twice the size of L denuded skin.       Psychiatric: He has a normal mood and affect. His behavior is abnormal.   Lab Results Recent Labs    03/27/21 0451 03/28/21 0607  WBC 8.9  --   HGB 9.3*  --   HCT 30.3*  --   NA 138  --   K 4.3  --   CL 107  --   CO2 23  --   BUN 19  --   CREATININE 1.00 0.93    Microbiology: Results for orders placed or performed during the hospital encounter of 03/24/21  Blood culture (routine x 2)     Status: None (Preliminary result)   Collection Time: 03/24/21 10:34 PM   Specimen: BLOOD  Result Value Ref Range Status   Specimen Description BLOOD BLOOD LEFT FOREARM  Final   Special Requests   Final    BOTTLES DRAWN AEROBIC AND ANAEROBIC Blood Culture results may not be optimal due to an excessive volume of blood received in culture bottles   Culture   Final    NO GROWTH 4 DAYS Performed at [facility], [address] [place], [place] [address]    Report Status PENDING  Incomplete  Blood culture (routine x 2)     Status: None (Preliminary result)   Collection Time: 03/24/21 10:34 PM   Specimen: BLOOD  Result Value Ref Range Status   Specimen Description BLOOD LEFT ANTECUBITAL  Final   Special Requests   Final    BOTTLES DRAWN AEROBIC AND ANAEROBIC Blood Culture results may not be optimal due to an excessive volume of blood received in culture bottles   Culture   Final    NO GROWTH 4 DAYS Performed at [facility], [address] [place],  [address]    Report Status PENDING  Incomplete  SARS CORONAVIRUS 2 (TAT 6-24 HRS) Nasopharyngeal Nasopharyngeal Swab     Status: None   Collection Time: 03/24/21 10:39 PM   Specimen: Nasopharyngeal Swab  Result Value Ref Range Status   SARS Coronavirus 2 NEGATIVE NEGATIVE Final    Comment: (NOTE) SARS-CoV-2 target nucleic acids are NOT DETECTED.  The  SARS-CoV-2 RNA is generally detectable in upper and lower respiratory specimens during the acute phase of infection. Negative results do not preclude SARS-CoV-2 infection, do not rule out co-infections with other pathogens, and should not be used  as the sole basis for treatment or other patient management decisions. Negative results must be combined with clinical observations, patient history, and epidemiological information. The expected result is Negative.  Fact Sheet for Patients: SugarRoll.be  Fact Sheet for Healthcare Providers: https://www.woods-mathews.com/  This test is not yet approved or cleared by the [place] FDA and  has been authorized for detection and/or diagnosis of SARS-CoV-2 by FDA under an Emergency Use Authorization (EUA). This EUA will remain  in effect (meaning this test can be used) for the duration of the COVID-19 declaration under Se ction 564(b)(1) of the Act, 21 U.S.C. section 360bbb-3(b)(1), unless the authorization is terminated or revoked sooner.  Performed at [facility], [place] [address] [place], [place] [address]   Aerobic Culture w Gram Stain (superficial specimen)     Status: None (Preliminary result)   Collection Time: 03/26/21  5:00 PM   Specimen: Wound  Result Value Ref Range Status   Specimen Description   Final    WOUND Performed at [facility], [address], [place], [place] [address]    Special Requests   Final    TOE Performed at [facility], [place], [place] [address]    Gram Stain   Final    FEW WBC PRESENT,BOTH PMN AND MONONUCLEAR RARE GRAM POSITIVE COCCI    Culture   Final    RARE STAPHYLOCOCCUS AUREUS SUSCEPTIBILITIES TO FOLLOW Performed at [facility], [address], [place], [place] [address]    Report Status PENDING  Incomplete    Studies/Results: No results found.  Assessment/Plan: Joshua [name] is a 50  y.o. male with multiple medical issues including poorly controlled DM (A1c > 11), homelessness, CAD, CHF, polysubstance abuse (Cocaine and THC) who had developed a wound on distal R toe after pulling on a skin scab (photo from 3/4) During early admission in March seen by podiatry and rec distal toe amputation but pt refused. He was rec to take augmentin but tells me he did not take. Readmitted late March with CP (Cocaine +) and PNA and again podiatry rec amputation but pt refused. He did start augmentin as otpt.  Now readmitted with fever, leukocytosis, MRI showing osteo of distal and prox phalnax as well as abscess over dorsal toe.  Only prior cx 3/4 mixed but also had rare  Enterobacter cloacae (No Staph aureus or Pseudomonas noted).  Started on vanco and cefepime. Podiatry rec toe amputation at this point but pt refuses again. I had a very long discussion with patient regarding the infection.  He is under the impression that the augmentin he was on caused the wound to worsen (although he tells me he did not take after first dc and took for a few days after second admit).  He tells me he has always had listed in his chart that he was allergic to penicillin but cannot tell me what the reaction was. Of note the prior notes suggest he did have gradual intro to amox at initial hospitalization and had no rash, or other signs of allergic reaction. Also has no eosinophilia.  He also tells me everyone he knows (100%) who had toe amputation went onto have total leg amputation. I spent significant time explaining to him that despite what has happened in past or to his friends, despite his perception that augmentin made the toe worse, we are faced with a potentially limb and life threatening infection. He needs IV abx and surgery. I  reiterated this to him and advised risk of sepsis without surgical I and D esp with the abscess.  4/13- some drainage now from tip of toe. Thinks the pain up his leg is improving 4/15  no fevers, wbc had come down from 17-9 Recommendations Cont cefepime and vanco and flagyl Cultured drainage from tip of toe is growing staph Apply warm compress to try to get to drain. Needs amputation of toe but he has refused. Would continue to work with him to see if we can convince him of the urgency of this situation. Today he tells me that he will consider it on Monday.  He tells me that if we check another MRI Monday and we think he still needs amputation he might agree Please order MRI for Monday morning Will need much better DM control as well. Thank you very much for the consult. Will follow with you.  Leonel Ramsay   03/28/2021, 12:50 PM

## 2021-03-29 DIAGNOSIS — M86171 Other acute osteomyelitis, right ankle and foot: Secondary | ICD-10-CM | POA: Diagnosis not present

## 2021-03-29 LAB — GLUCOSE, CAPILLARY
Glucose-Capillary: 77 mg/dL (ref 70–99)
Glucose-Capillary: 148 mg/dL — ABNORMAL HIGH (ref 70–99)
Glucose-Capillary: 142 mg/dL — ABNORMAL HIGH (ref 70–99)
Glucose-Capillary: 208 mg/dL — ABNORMAL HIGH (ref 70–99)

## 2021-03-29 LAB — CULTURE, BLOOD (ROUTINE X 2)
Culture: NO GROWTH
Culture: NO GROWTH

## 2021-03-29 LAB — AEROBIC CULTURE W GRAM STAIN (SUPERFICIAL SPECIMEN)

## 2021-03-29 LAB — SEDIMENTATION RATE: Sed Rate: 56 mm/hr — ABNORMAL HIGH (ref 0–15)

## 2021-03-29 MED ORDER — INSULIN GLARGINE 100 UNIT/ML ~~LOC~~ SOLN
42.0000 [IU] | Freq: Every day | SUBCUTANEOUS | Status: DC
  Administered 2021-03-29 – 2021-04-05 (×7): 42 [IU] via SUBCUTANEOUS
  Filled 2021-03-29 (×9): qty 0.42

## 2021-03-29 NOTE — Progress Notes (Signed)
Progress Note    Joshua [name]  TDD:[id number] DOB: [birth year]  DOA: 03/24/2021 PCP: Patient, No Pcp Per (Inactive)      Brief Narrative:    Medical records reviewed and are as summarized below:  Joshua [name] is a 50 y.o. male with medical history significant for chronic systolic and diastolic CHF, type 2 diabetes mellitus, hypertension,Cocaine abuse, CAD, tobacco use disorder, chronic anemia, recent hospitalization for sepsis secondary to multifocal pneumonia and osteomyelitis of the right great toe.  He had been seen by podiatrist in the past and amputation of the right big toe had been recommended.  However, patient had refused to have amputation done and preferred to treat right big toe osteomyelitis with antibiotics.    He presented to the hospital because of pain and swelling extending from the right big toe through the foot to the right lower leg.  He also complained of redness and warmth of the right foot and right leg.  He was admitted to the hospital for sepsis secondary to acute on chronic osteomyelitis of the right big toe and right lower extremity cellulitis.      Assessment/Plan:   Active Problems:   Acute osteomyelitis of toe, right (HCC)    Body mass index is 26.1 kg/m.   Sepsis secondary to acute on chronic osteomyelitis of right big toe with cellulitis of right leg and foot: Continue current antibiotics.  Patient has been evaluated by the podiatrist.  Amputation of the right big toe was recommended. Wound culture from the right big toe is growing staph aureus. Plan to repeat MRI of the right big toe on 03/31/2021.  He said findings on MRI will help to make a decision regarding amputation.  Hypertension: Continue antihypertensives  Type II DM with peripheral neuropathy: Glucose levels are better.  Decrease Lantus from 43 units to 42 units daily.  Continue NovoLog as needed for hyperglycemia.  Monitor glucose levels closely.   Chronic  systolic and diastolic CHF: Continue carvedilol, losartan and dapagliflozin. 2D echo on 07/16/2020 showed EF estimated at 25 to 30%,  grade 3 diastolic dysfunction, moderate MR  Diet Order            Diet heart healthy/carb modified Room service appropriate? Yes; Fluid consistency: Thin  Diet effective now                    Consultants:  Podiatrist  Infectious disease specialist  Procedures:  None    Medications:   . ascorbic acid  250 mg Oral BID  . carvedilol  3.125 mg Oral BID WC  . dapagliflozin propanediol  5 mg Oral Daily  . enoxaparin (LOVENOX) injection  40 mg Subcutaneous Q24H  . fluticasone  2 spray Each Nare Daily  . gabapentin  100 mg Oral QHS  . insulin aspart  0-15 Units Subcutaneous TID AC & HS  . insulin glargine  42 Units Subcutaneous Daily  . losartan  25 mg Oral Daily  . metroNIDAZOLE  500 mg Oral Q8H  . multivitamin with minerals  1 tablet Oral Daily  . spironolactone  25 mg Oral Daily   Continuous Infusions: . sodium chloride 100 mL/hr at 03/29/21 0208  . ceFEPime (MAXIPIME) IV 2 g (03/29/21 1301)  . vancomycin 1,250 mg (03/29/21 1342)     Anti-infectives (From admission, onward)   Start     Dose/Rate Route Frequency Ordered Stop   03/25/21 2200  metroNIDAZOLE (FLAGYL) tablet 500 mg  500 mg Oral Every 8 hours 03/25/21 1839     03/25/21 1200  vancomycin (VANCOREADY) IVPB 1250 mg/250 mL        1,250 mg 166.7 mL/hr over 90 Minutes Intravenous Every 12 hours 03/25/21 0057     03/25/21 0600  ceFEPIme (MAXIPIME) 2 g in sodium chloride 0.9 % 100 mL IVPB        2 g 200 mL/hr over 30 Minutes Intravenous Every 8 hours 03/25/21 0054     03/24/21 2345  vancomycin (VANCOCIN) IVPB 1000 mg/200 mL premix  Status:  Discontinued        1,000 mg 200 mL/hr over 60 Minutes Intravenous  Once 03/24/21 2331 03/24/21 2349   03/24/21 2345  ceFEPIme (MAXIPIME) 2 g in sodium chloride 0.9 % 100 mL IVPB  Status:  Discontinued        2 g 200 mL/hr over 30  Minutes Intravenous  Once 03/24/21 2331 03/25/21 0049   03/24/21 2300  vancomycin (VANCOREADY) IVPB 1750 mg/350 mL        1,750 mg 175 mL/hr over 120 Minutes Intravenous  Once 03/24/21 2256 03/25/21 0208   03/24/21 2245  vancomycin (VANCOCIN) injection 1,750 mg  Status:  Discontinued        1,750 mg Intravenous  Once 03/24/21 2221 03/24/21 2255   03/24/21 2230  ceFEPIme (MAXIPIME) 2 g in sodium chloride 0.9 % 100 mL IVPB        2 g 200 mL/hr over 30 Minutes Intravenous  Once 03/24/21 2221 03/24/21 2359             Family Communication/Anticipated D/C date and plan/Code Status   DVT prophylaxis: enoxaparin (LOVENOX) injection 40 mg Start: 03/25/21 1600 SCDs Start: 03/24/21 2330     Code Status: Full Code  Family Communication: None Disposition Plan:    Status is: Inpatient  Remains inpatient appropriate because:IV treatments appropriate due to intensity of illness or inability to take PO and Inpatient level of care appropriate due to severity of illness   Dispo: The patient is from: Home              Anticipated d/c is to: Home              Patient currently is not medically stable to d/c.   Difficult to place patient No           Subjective:   Interval events noted.  He still has pain in the right foot and distal right leg  Objective:    Vitals:   03/28/21 1506 03/28/21 1929 03/29/21 0430 03/29/21 0758  BP: 108/78 105/68 100/60 106/67  Pulse: 74 75 75 82  Resp: 16 18 18 16   Temp: 98.6 F (37 C) 98.8 F (37.1 C) 97.9 F (36.6 C) 98.8 F (37.1 C)  TempSrc:      SpO2: 100% 100% 100% 98%  Weight:      Height:       No data found.   Intake/Output Summary (Last 24 hours) at 03/29/2021 1510 Last data filed at 03/29/2021 1350 Gross per 24 hour  Intake 1200 ml  Output 2400 ml  Net -1200 ml   Filed Weights   03/24/21 2140  Weight: 82.5 kg    Exam:  GEN: NAD SKIN: No rash EYES: EOMI ENT: MMM CV: RRR PULM: CTA B ABD: soft, ND, NT,  +BS CNS: AAO x 3, non focal EXT: Tenderness in the right big toe, right foot and distal right leg  Data Reviewed:   I have personally reviewed following labs and imaging studies:  Labs: Labs show the following:   Basic Metabolic Panel: Recent Labs  Lab 03/24/21 2144 03/25/21 0449 03/27/21 0451 03/28/21 0607  NA 133* 135 138  --   K 4.2 4.0 4.3  --   CL 100 108 107  --   CO2 26 22 23   --   GLUCOSE 190* 157* 207*  --   BUN 8 8 19   --   CREATININE 0.91 0.68 1.00 0.93  CALCIUM 8.4* 7.7* 8.0*  --    GFR Estimated Creatinine Clearance: 99.2 mL/min (by C-G formula based on SCr of 0.93 mg/dL). Liver Function Tests: Recent Labs  Lab 03/24/21 2144  AST 15  ALT 10  ALKPHOS 63  BILITOT 0.8  PROT 7.2  ALBUMIN 3.3*   No results for input(s): LIPASE, AMYLASE in the last 168 hours. No results for input(s): AMMONIA in the last 168 hours. Coagulation profile No results for input(s): INR, PROTIME in the last 168 hours.  CBC: Recent Labs  Lab 03/24/21 2144 03/25/21 0449 03/27/21 0451  WBC 18.9* 17.6* 8.9  NEUTROABS 15.2*  --  5.1  HGB 10.4* 10.4* 9.3*  HCT 33.1* 33.5* 30.3*  MCV 83.6 84.0 84.9  PLT 424* 415* 413*   Cardiac Enzymes: No results for input(s): CKTOTAL, CKMB, CKMBINDEX, TROPONINI in the last 168 hours. BNP (last 3 results) No results for input(s): PROBNP in the last 8760 hours. CBG: Recent Labs  Lab 03/28/21 1126 03/28/21 1738 03/28/21 2106 03/29/21 0759 03/29/21 1153  GLUCAP 117* 154* 152* 77 148*   D-Dimer: No results for input(s): DDIMER in the last 72 hours. Hgb A1c: No results for input(s): HGBA1C in the last 72 hours. Lipid Profile: No results for input(s): CHOL, HDL, LDLCALC, TRIG, CHOLHDL, LDLDIRECT in the last 72 hours. Thyroid function studies: No results for input(s): TSH, T4TOTAL, T3FREE, THYROIDAB in the last 72 hours.  Invalid input(s): FREET3 Anemia work up: No results for input(s): VITAMINB12, FOLATE, FERRITIN,  TIBC, IRON, RETICCTPCT in the last 72 hours. Sepsis Labs: Recent Labs  Lab 03/24/21 2144 03/24/21 2234 03/25/21 0013 03/25/21 0449 03/27/21 0451  WBC 18.9*  --   --  17.6* 8.9  LATICACIDVEN  --  1.1 1.6  --   --     Microbiology Recent Results (from the past 240 hour(s))  Blood culture (routine x 2)     Status: None   Collection Time: 03/24/21 10:34 PM   Specimen: BLOOD  Result Value Ref Range Status   Specimen Description BLOOD BLOOD LEFT FOREARM  Final   Special Requests   Final    BOTTLES DRAWN AEROBIC AND ANAEROBIC Blood Culture results may not be optimal due to an excessive volume of blood received in culture bottles   Culture   Final    NO GROWTH 5 DAYS Performed at [facility], [address] [place], [place] [address]    Report Status 03/29/2021 FINAL  Final  Blood culture (routine x 2)     Status: None   Collection Time: 03/24/21 10:34 PM   Specimen: BLOOD  Result Value Ref Range Status   Specimen Description BLOOD LEFT ANTECUBITAL  Final   Special Requests   Final    BOTTLES DRAWN AEROBIC AND ANAEROBIC Blood Culture results may not be optimal due to an excessive volume of blood received in culture bottles   Culture   Final    NO GROWTH 5 DAYS Performed at [facility], [address], [place], [place] [address]    Report Status 03/29/2021 FINAL  Final  SARS CORONAVIRUS 2 (TAT 6-24 HRS) Nasopharyngeal Nasopharyngeal Swab     Status: None   Collection Time: 03/24/21 10:39 PM   Specimen: Nasopharyngeal Swab  Result Value Ref Range Status   SARS Coronavirus 2 NEGATIVE NEGATIVE Final    Comment: (NOTE) SARS-CoV-2 target nucleic acids are NOT DETECTED.  The SARS-CoV-2 RNA is generally detectable in upper and lower respiratory specimens during the acute phase of infection. Negative results do not preclude SARS-CoV-2 infection, do not rule out co-infections with other pathogens, and should not be used as the sole basis for treatment or other  patient management decisions. Negative results must be combined with clinical observations, patient history, and epidemiological information. The expected result is Negative.  Fact Sheet for Patients: SugarRoll.be  Fact Sheet for Healthcare Providers: https://www.woods-mathews.com/  This test is not yet approved or cleared by the [place] FDA and  has been authorized for detection and/or diagnosis of SARS-CoV-2 by FDA under an Emergency Use Authorization (EUA). This EUA will remain  in effect (meaning this test can be used) for the duration of the COVID-19 declaration under Se ction 564(b)(1) of the Act, 21 U.S.C. section 360bbb-3(b)(1), unless the authorization is terminated or revoked sooner.  Performed at [facility], [place] [address] [place], [place] [address]   Aerobic Culture w Gram Stain (superficial specimen)     Status: None   Collection Time: 03/26/21  5:00 PM   Specimen: Wound  Result Value Ref Range Status   Specimen Description   Final    WOUND Performed at [facility], [address] [place], [place] [address]    Special Requests   Final    TOE Performed at [facility], [place], [place] [address]    Gram Stain   Final    FEW WBC PRESENT,BOTH PMN AND MONONUCLEAR RARE GRAM POSITIVE COCCI Performed at [facility], [place] [address], [place], [place] [address]    Culture RARE STAPHYLOCOCCUS AUREUS  Final   Report Status 03/29/2021 FINAL  Final   Organism ID, Bacteria STAPHYLOCOCCUS AUREUS  Final      Susceptibility   Staphylococcus aureus - MIC*    CIPROFLOXACIN <=0.5 SENSITIVE Sensitive     ERYTHROMYCIN <=0.25 SENSITIVE Sensitive     GENTAMICIN <=0.5 SENSITIVE Sensitive     OXACILLIN 0.5 SENSITIVE Sensitive     TETRACYCLINE <=1 SENSITIVE Sensitive     VANCOMYCIN <=0.5 SENSITIVE Sensitive     TRIMETH/SULFA <=10 SENSITIVE Sensitive     CLINDAMYCIN <=0.25 SENSITIVE  Sensitive     RIFAMPIN <=0.5 SENSITIVE Sensitive     Inducible Clindamycin NEGATIVE Sensitive     * RARE STAPHYLOCOCCUS AUREUS    Procedures and diagnostic studies:  No results found.             LOS: 5 days   Nashanti Duquette  [facility]   Pager on [email or web address]. If 7PM-7AM, please contact night-coverage at [email or web address]     03/29/2021, 3:10 PM

## 2021-03-29 NOTE — Plan of Care (Signed)
Patient had an uneventful shift. No changes in neurological and neurovascular assessments. Pain controlled with PRN medications. Vital signs within normal range.Denies any needs at this time. All Safety measures maintained. Care continues.  Problem: Education: Goal: Knowledge of General Education information will improve Description: Including pain rating scale, medication(s)/side effects and non-pharmacologic comfort measures Outcome: Progressing   Problem: Health Behavior/Discharge Planning: Goal: Ability to manage health-related needs will improve Outcome: Progressing   Problem: Clinical Measurements: Goal: Ability to maintain clinical measurements within normal limits will improve Outcome: Progressing Goal: Will remain free from infection Outcome: Progressing Goal: Diagnostic test results will improve Outcome: Progressing Goal: Respiratory complications will improve Outcome: Progressing Goal: Cardiovascular complication will be avoided Outcome: Progressing   Problem: Activity: Goal: Risk for activity intolerance will decrease Outcome: Progressing   Problem: Nutrition: Goal: Adequate nutrition will be maintained Outcome: Progressing   Problem: Coping: Goal: Level of anxiety will decrease Outcome: Progressing   Problem: Elimination: Goal: Will not experience complications related to bowel motility Outcome: Progressing Goal: Will not experience complications related to urinary retention Outcome: Progressing   Problem: Pain Managment: Goal: General experience of comfort will improve Outcome: Progressing   Problem: Safety: Goal: Ability to remain free from injury will improve Outcome: Progressing   Problem: Skin Integrity: Goal: Risk for impaired skin integrity will decrease Outcome: Progressing

## 2021-03-30 ENCOUNTER — Inpatient Hospital Stay: Payer: Medicaid Other

## 2021-03-30 DIAGNOSIS — M86171 Other acute osteomyelitis, right ankle and foot: Secondary | ICD-10-CM | POA: Diagnosis not present

## 2021-03-30 LAB — CREATININE, SERUM
Creatinine, Ser: 0.91 mg/dL (ref 0.61–1.24)
GFR, Estimated: >60 mL/min (ref >60)

## 2021-03-30 LAB — GLUCOSE, CAPILLARY
Glucose-Capillary: 185 mg/dL — ABNORMAL HIGH (ref 70–99)
Glucose-Capillary: 115 mg/dL — ABNORMAL HIGH (ref 70–99)
Glucose-Capillary: 174 mg/dL — ABNORMAL HIGH (ref 70–99)
Glucose-Capillary: 192 mg/dL — ABNORMAL HIGH (ref 70–99)

## 2021-03-30 IMAGING — MR MR TOES*R* WO/W CM
9 series · 40 of 40 positions shown · IV contrast (8ml Gadavist)
Comparison: [DATE]

CLINICAL DATA: Re-evaluation of osteomyelitis of the great toe.

EXAM:
MRI OF THE RIGHT TOES WITHOUT AND WITH CONTRAST
TECHNIQUE: Multiplanar, multisequence MR imaging of the right forefoot was
performed both before and after administration of intravenous
contrast.
CONTRAST:  8mL GADAVIST GADOBUTROL 1 MMOL/ML IV SOLN

[Series 3: T1 · coronal · right · 3.0mm · 0.38mm/px · 5 of 45 slices shown (1 of 2)]
[im 1/45]
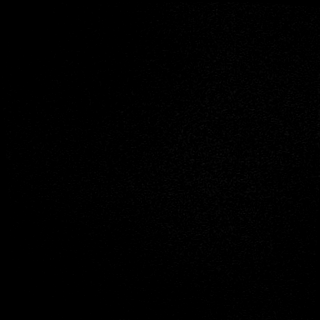
[im 12/45]
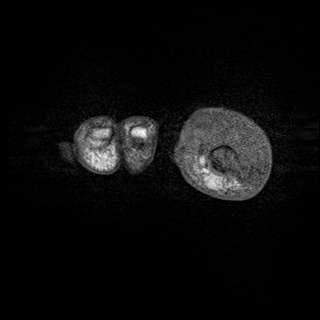
[im 23/45]
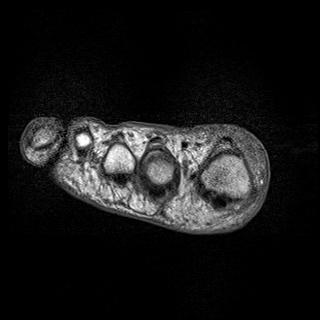
[im 34/45]
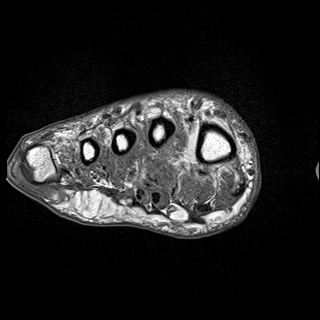
[im 45/45]
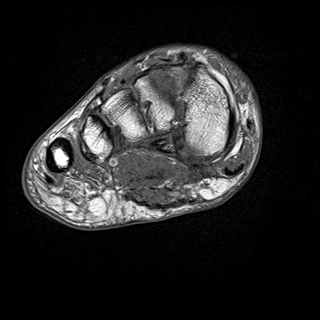

[Series 5: T2 · coronal · right · 3.0mm · 0.38mm/px · 6 of 45 slices shown (1 of 2)]
[im 1/45]
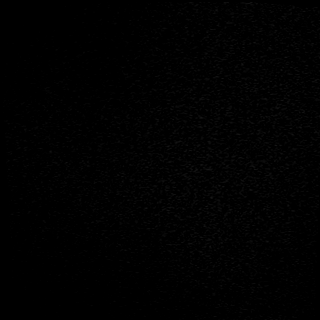
[im 9/45]
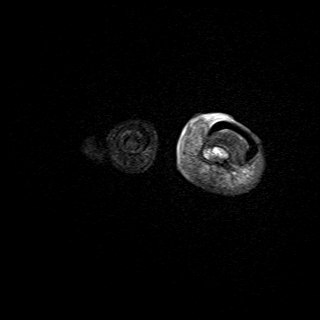
[im 18/45]
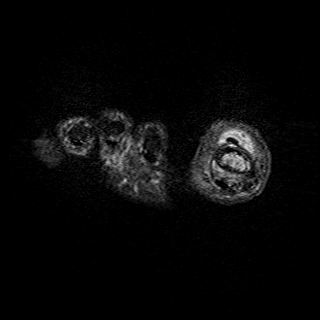
[im 27/45]
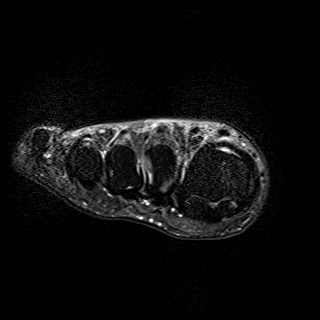
[im 36/45]
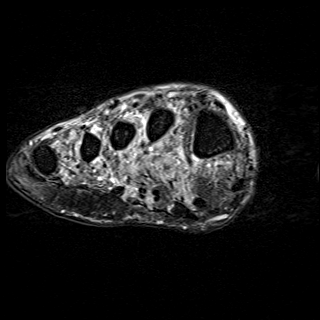
[im 45/45]
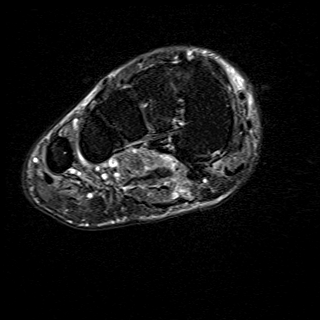

[Series 6: T1 fat-sat · coronal · non-contrast · right · 3.0mm · 0.47mm/px · 6 of 45 slices shown (1 of 3)]
[im 1/45]
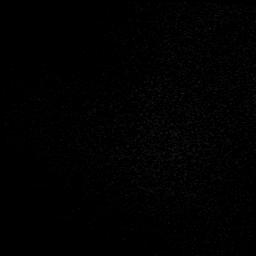
[im 9/45]
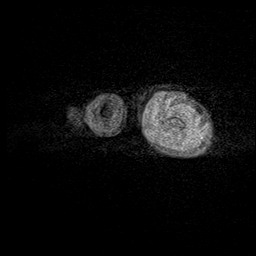
[im 18/45]
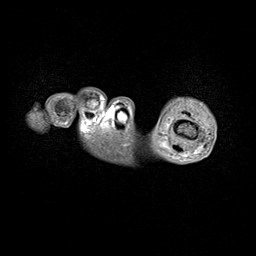
[im 27/45]
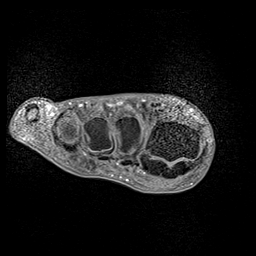
[im 36/45]
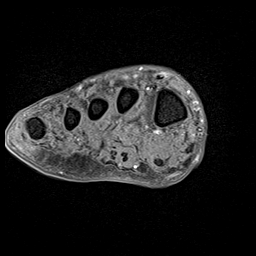
[im 45/45]
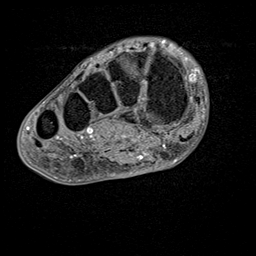

[Series 7: T1 · axial · right · 3.0mm · 0.70mm/px · z∈[-117,-53]mm · 3 of 20 slices shown (2 of 2)]
[im 1/20]
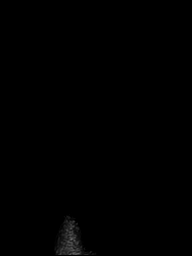
[im 10/20]
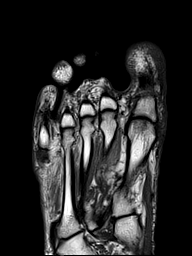
[im 20/20]
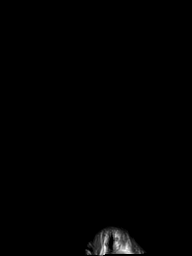

[Series 9: T2 · axial · right · 3.0mm · 0.70mm/px · z∈[-117,-53]mm · 3 of 20 slices shown (2 of 2)]
[im 1/20]
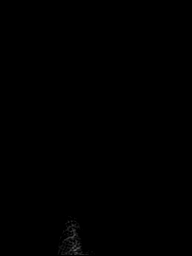
[im 10/20]
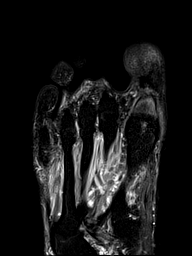
[im 20/20]
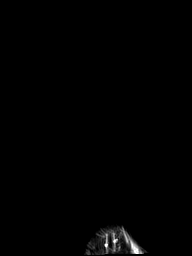

[Series 10: STIR · sagittal · right · 3.0mm · 0.62mm/px · 4 of 33 slices shown]
[im 1/33]
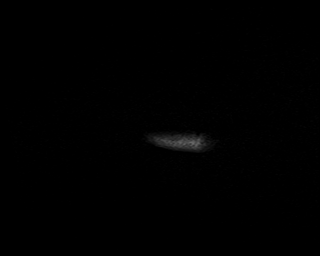
[im 11/33]
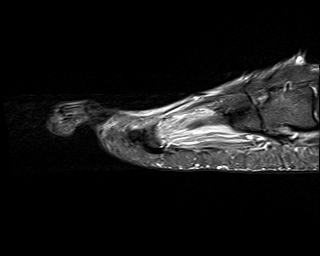
[im 22/33]
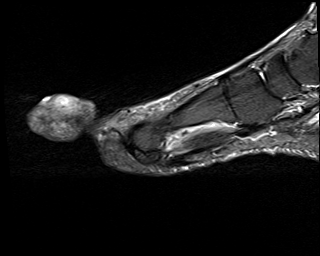
[im 33/33]
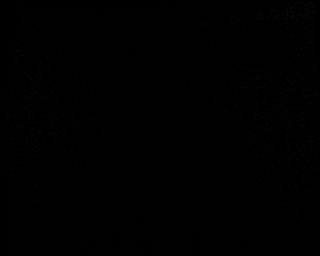

[Series 11: T1 fat-sat post-contrast · coronal · right · 3.0mm · 0.47mm/px · 6 of 45 slices shown]
[im 1/45]
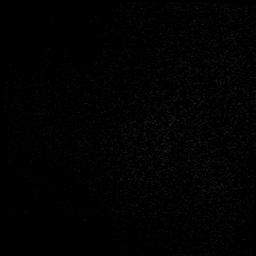
[im 9/45]
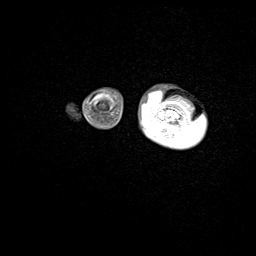
[im 18/45]
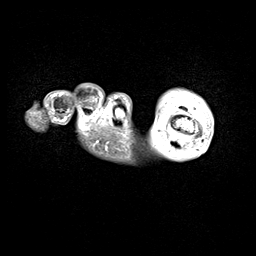
[im 27/45]
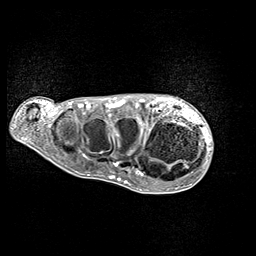
[im 36/45]
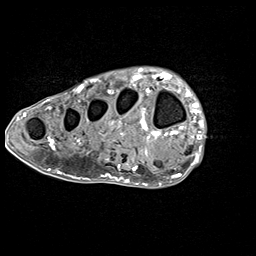
[im 45/45]
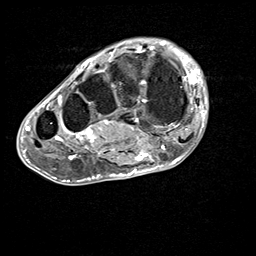

[Series 12: T1 fat-sat · axial · right · 3.0mm · 0.56mm/px · z∈[-117,-53]mm · 3 of 20 slices shown (2 of 3)]
[im 1/20]
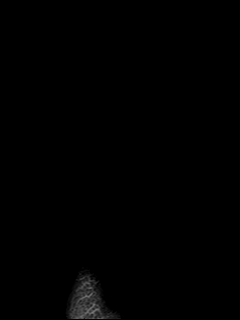
[im 10/20]
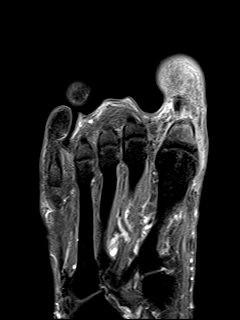
[im 20/20]
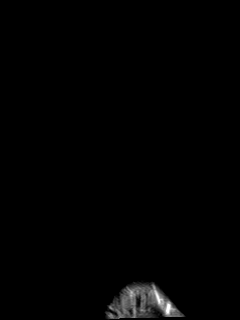

[Series 13: T1 fat-sat · sagittal · right · 3.0mm · 0.62mm/px · 4 of 32 slices shown (3 of 3)]
[im 1/32]
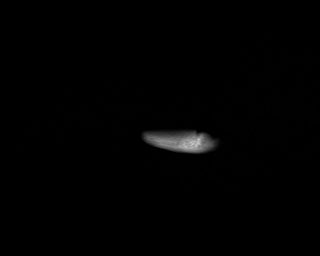
[im 11/32]
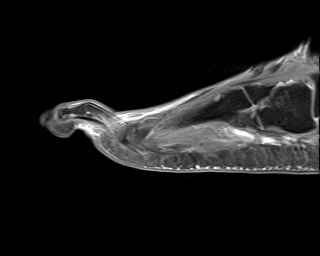
[im 21/32]
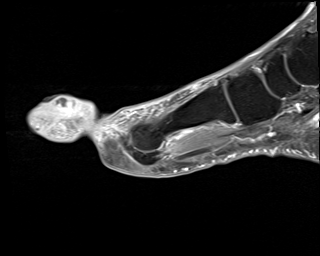
[im 32/32]
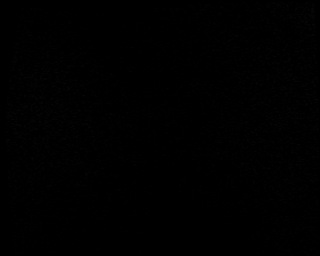

[40 of 40 positions shown; findings below may reference images not displayed]

FINDINGS: Bones/Joint/Cartilage

Severe soft tissue edema of the great toe extending into the more
proximal medial forefoot with enhancement most consistent with
cellulitis. Complex fluid collection along the dorsal aspect of the
great toe measuring 2 x 0.5 x 2.6 cm consistent with an abscess.
Large skin blister along the medial aspect of the great toe. Severe
bone marrow edema in the first distal phalanx and head of the first
proximal phalanx most concerning for osteomyelitis.

Mild osteoarthritis of the first MTP joint with subchondral reactive
marrow changes. No acute fracture or dislocation. Normal alignment.
No joint effusion.

Ligaments

Collateral ligaments are intact.  Lisfranc ligament is intact.

Muscles and Tendons
Flexor, peroneal and extensor compartment tendons are intact. Mild
edema in the plantar musculature likely neurogenic.

Soft tissue
No other fluid collection or hematoma.  No soft tissue mass.
IMPRESSION: 1. Severe cellulitis of the great toe extending into the more
proximal medial forefoot. Complex fluid collection along the dorsal
aspect of the great toe measuring 1.8 x 0.4 x 2.6 cm consistent with
an abscess. Osteomyelitis of the first distal phalanx and to lesser
extent head of the first proximal phalanx. No significant interval
change compared with [DATE].

## 2021-03-30 MED ORDER — GADOBUTROL 1 MMOL/ML IV SOLN
8.0000 mL | Freq: Once | INTRAVENOUS | Status: AC | PRN
  Administered 2021-03-30: 8 mL via INTRAVENOUS

## 2021-03-30 MED ORDER — CEFAZOLIN SODIUM-DEXTROSE 2-4 GM/100ML-% IV SOLN
2.0000 g | Freq: Three times a day (TID) | INTRAVENOUS | Status: DC
  Administered 2021-03-30 – 2021-04-05 (×18): 2 g via INTRAVENOUS
  Filled 2021-03-30 (×23): qty 100

## 2021-03-30 MED ORDER — OXYCODONE HCL 5 MG PO TABS
5.0000 mg | ORAL_TABLET | Freq: Four times a day (QID) | ORAL | Status: DC | PRN
Start: 2021-03-30 — End: 2021-04-01
  Administered 2021-03-30 – 2021-03-31 (×4): 5 mg via ORAL
  Filled 2021-03-30 (×4): qty 1

## 2021-03-30 NOTE — Progress Notes (Addendum)
Progress Note    Joshua [name]  WUG:[id number] DOB: December 05, 1971  DOA: 03/24/2021 PCP: Patient, No Pcp Per (Inactive)      Brief Narrative:    Medical records reviewed and are as summarized below:  Joshua [name] is a 50 y.o. male with medical history significant for chronic systolic and diastolic CHF, type 2 diabetes mellitus, hypertension,Cocaine abuse, CAD, tobacco use disorder, chronic anemia, recent hospitalization for sepsis secondary to multifocal pneumonia and osteomyelitis of the right great toe.  He had been seen by podiatrist in the past and amputation of the right big toe had been recommended.  However, patient had refused to have amputation done and preferred to treat right big toe osteomyelitis with antibiotics.    He presented to the hospital because of pain and swelling extending from the right big toe through the foot to the right lower leg.  He also complained of redness and warmth of the right foot and right leg.  He was admitted to the hospital for sepsis secondary to acute on chronic osteomyelitis of the right big toe and right lower extremity cellulitis.      Assessment/Plan:   Active Problems:   Acute osteomyelitis of toe, right (HCC)    Body mass index is 26.1 kg/m.   Sepsis secondary to acute on chronic osteomyelitis of right big toe with cellulitis of right leg and foot: Continue current antibiotics.  Patient has been evaluated by the podiatrist.  Amputation of the right big toe was recommended. Wound culture from right big toe showed MSSA.  De-escalate antibiotics from IV Vanco and cefepime to IV cefazolin.  Continue oral Flagyl. MRI of the right foot has been ordered.  He said findings on MRI will help him make a decision regarding amputation.  Hypertension: Continue antihypertensives  Type II DM with peripheral neuropathy: Continue Lantus 42 units daily.  Continue NovoLog as needed for hyperglycemia.  Monitor glucose levels  closely.   Chronic systolic and diastolic CHF: Continue carvedilol, losartan and dapagliflozin. 2D echo on 07/16/2020 showed EF estimated at 25 to 30%,  grade 3 diastolic dysfunction, moderate MR  Diet Order            Diet heart healthy/carb modified Room service appropriate? Yes; Fluid consistency: Thin  Diet effective now                    Consultants:  Podiatrist  Infectious disease specialist  Procedures:  None    Medications:   . ascorbic acid  250 mg Oral BID  . carvedilol  3.125 mg Oral BID WC  . dapagliflozin propanediol  5 mg Oral Daily  . enoxaparin (LOVENOX) injection  40 mg Subcutaneous Q24H  . fluticasone  2 spray Each Nare Daily  . gabapentin  100 mg Oral QHS  . insulin aspart  0-15 Units Subcutaneous TID AC & HS  . insulin glargine  42 Units Subcutaneous Daily  . losartan  25 mg Oral Daily  . metroNIDAZOLE  500 mg Oral Q8H  . multivitamin with minerals  1 tablet Oral Daily  . spironolactone  25 mg Oral Daily   Continuous Infusions: . sodium chloride 100 mL/hr at 03/29/21 1759  .  ceFAZolin (ANCEF) IV       Anti-infectives (From admission, onward)   Start     Dose/Rate Route Frequency Ordered Stop   03/30/21 1400  ceFAZolin (ANCEF) IVPB 2g/100 mL premix        2 g 200 mL/hr  over 30 Minutes Intravenous Every 8 hours 03/30/21 1211     03/25/21 2200  metroNIDAZOLE (FLAGYL) tablet 500 mg        500 mg Oral Every 8 hours 03/25/21 1839     03/25/21 1200  vancomycin (VANCOREADY) IVPB 1250 mg/250 mL  Status:  Discontinued        1,250 mg 166.7 mL/hr over 90 Minutes Intravenous Every 12 hours 03/25/21 0057 03/30/21 1211   03/25/21 0600  ceFEPIme (MAXIPIME) 2 g in sodium chloride 0.9 % 100 mL IVPB  Status:  Discontinued        2 g 200 mL/hr over 30 Minutes Intravenous Every 8 hours 03/25/21 0054 03/30/21 1211   03/24/21 2345  vancomycin (VANCOCIN) IVPB 1000 mg/200 mL premix  Status:  Discontinued        1,000 mg 200 mL/hr over 60 Minutes  Intravenous  Once 03/24/21 2331 03/24/21 2349   03/24/21 2345  ceFEPIme (MAXIPIME) 2 g in sodium chloride 0.9 % 100 mL IVPB  Status:  Discontinued        2 g 200 mL/hr over 30 Minutes Intravenous  Once 03/24/21 2331 03/25/21 0049   03/24/21 2300  vancomycin (VANCOREADY) IVPB 1750 mg/350 mL        1,750 mg 175 mL/hr over 120 Minutes Intravenous  Once 03/24/21 2256 03/25/21 0208   03/24/21 2245  vancomycin (VANCOCIN) injection 1,750 mg  Status:  Discontinued        1,750 mg Intravenous  Once 03/24/21 2221 03/24/21 2255   03/24/21 2230  ceFEPIme (MAXIPIME) 2 g in sodium chloride 0.9 % 100 mL IVPB        2 g 200 mL/hr over 30 Minutes Intravenous  Once 03/24/21 2221 03/24/21 2359             Family Communication/Anticipated D/C date and plan/Code Status   DVT prophylaxis: enoxaparin (LOVENOX) injection 40 mg Start: 03/25/21 1600 SCDs Start: 03/24/21 2330     Code Status: Full Code  Family Communication: None Disposition Plan:    Status is: Inpatient  Remains inpatient appropriate because:IV treatments appropriate due to intensity of illness or inability to take PO and Inpatient level of care appropriate due to severity of illness   Dispo: The patient is from: Home              Anticipated d/c is to: Home              Patient currently is not medically stable to d/c.   Difficult to place patient No           Subjective:   C/o pain in the right foot.  Objective:    Vitals:   03/29/21 1639 03/29/21 2013 03/30/21 0353 03/30/21 0759  BP: 114/80 102/70 116/83 102/64  Pulse: 78 76 85 72  Resp: 16 20 16 18   Temp: 97.9 F (36.6 C) 98.2 F (36.8 C) 98.2 F (36.8 C) 98.4 F (36.9 C)  TempSrc:  Oral Oral Oral  SpO2: 100% 99% 100% 100%  Weight:      Height:       No data found.   Intake/Output Summary (Last 24 hours) at 03/30/2021 1211 Last data filed at 03/30/2021 0956 Gross per 24 hour  Intake 960 ml  Output 3275 ml  Net -2315 ml   Filed Weights    03/24/21 2140  Weight: 82.5 kg    Exam:  GEN: NAD SKIN: No rash EYES: No acute abnormality ENT: MMM CV: RRR PULM: CTA  B ABD: soft, ND,  CNS: Sleepy but arousable. EXT: Tenderness on the right big toe and right foot.  No erythema.  Mild swelling of right big toe         Data Reviewed:   I have personally reviewed following labs and imaging studies:  Labs: Labs show the following:   Basic Metabolic Panel: Recent Labs  Lab 03/24/21 2144 03/25/21 0449 03/27/21 0451 03/28/21 0607 03/30/21 0614  NA 133* 135 138  --   --   K 4.2 4.0 4.3  --   --   CL 100 108 107  --   --   CO2 26 22 23   --   --   GLUCOSE 190* 157* 207*  --   --   BUN 8 8 19   --   --   CREATININE 0.91 0.68 1.00 0.93 0.91  CALCIUM 8.4* 7.7* 8.0*  --   --    GFR Estimated Creatinine Clearance: 101.4 mL/min (by C-G formula based on SCr of 0.91 mg/dL). Liver Function Tests: Recent Labs  Lab 03/24/21 2144  AST 15  ALT 10  ALKPHOS 63  BILITOT 0.8  PROT 7.2  ALBUMIN 3.3*   No results for input(s): LIPASE, AMYLASE in the last 168 hours. No results for input(s): AMMONIA in the last 168 hours. Coagulation profile No results for input(s): INR, PROTIME in the last 168 hours.  CBC: Recent Labs  Lab 03/24/21 2144 03/25/21 0449 03/27/21 0451  WBC 18.9* 17.6* 8.9  NEUTROABS 15.2*  --  5.1  HGB 10.4* 10.4* 9.3*  HCT 33.1* 33.5* 30.3*  MCV 83.6 84.0 84.9  PLT 424* 415* 413*   Cardiac Enzymes: No results for input(s): CKTOTAL, CKMB, CKMBINDEX, TROPONINI in the last 168 hours. BNP (last 3 results) No results for input(s): PROBNP in the last 8760 hours. CBG: Recent Labs  Lab 03/29/21 1153 03/29/21 1640 03/29/21 2108 03/30/21 0800 03/30/21 1158  GLUCAP 148* 142* 208* 185* 115*   D-Dimer: No results for input(s): DDIMER in the last 72 hours. Hgb A1c: No results for input(s): HGBA1C in the last 72 hours. Lipid Profile: No results for input(s): CHOL, HDL, LDLCALC, TRIG, CHOLHDL,  LDLDIRECT in the last 72 hours. Thyroid function studies: No results for input(s): TSH, T4TOTAL, T3FREE, THYROIDAB in the last 72 hours.  Invalid input(s): FREET3 Anemia work up: No results for input(s): VITAMINB12, FOLATE, FERRITIN, TIBC, IRON, RETICCTPCT in the last 72 hours. Sepsis Labs: Recent Labs  Lab 03/24/21 2144 03/24/21 2234 03/25/21 0013 03/25/21 0449 03/27/21 0451  WBC 18.9*  --   --  17.6* 8.9  LATICACIDVEN  --  1.1 1.6  --   --     Microbiology Recent Results (from the past 240 hour(s))  Blood culture (routine x 2)     Status: None   Collection Time: 03/24/21 10:34 PM   Specimen: BLOOD  Result Value Ref Range Status   Specimen Description BLOOD BLOOD LEFT FOREARM  Final   Special Requests   Final    BOTTLES DRAWN AEROBIC AND ANAEROBIC Blood Culture results may not be optimal due to an excessive volume of blood received in culture bottles   Culture   Final    NO GROWTH 5 DAYS Performed at [facility], [address] [place], [place] [address]    Report Status 03/29/2021 FINAL  Final  Blood culture (routine x 2)     Status: None   Collection Time: 03/24/21 10:34 PM   Specimen: BLOOD  Result Value  Ref Range Status   Specimen Description BLOOD LEFT ANTECUBITAL  Final   Special Requests   Final    BOTTLES DRAWN AEROBIC AND ANAEROBIC Blood Culture results may not be optimal due to an excessive volume of blood received in culture bottles   Culture   Final    NO GROWTH 5 DAYS Performed at [facility], [address], [place], [place] [address]    Report Status 03/29/2021 FINAL  Final  SARS CORONAVIRUS 2 (TAT 6-24 HRS) Nasopharyngeal Nasopharyngeal Swab     Status: None   Collection Time: 03/24/21 10:39 PM   Specimen: Nasopharyngeal Swab  Result Value Ref Range Status   SARS Coronavirus 2 NEGATIVE NEGATIVE Final    Comment: (NOTE) SARS-CoV-2 target nucleic acids are NOT DETECTED.  The SARS-CoV-2 RNA is generally detectable in upper and  lower respiratory specimens during the acute phase of infection. Negative results do not preclude SARS-CoV-2 infection, do not rule out co-infections with other pathogens, and should not be used as the sole basis for treatment or other patient management decisions. Negative results must be combined with clinical observations, patient history, and epidemiological information. The expected result is Negative.  Fact Sheet for Patients: SugarRoll.be  Fact Sheet for Healthcare Providers: https://www.woods-mathews.com/  This test is not yet approved or cleared by the [place] FDA and  has been authorized for detection and/or diagnosis of SARS-CoV-2 by FDA under an Emergency Use Authorization (EUA). This EUA will remain  in effect (meaning this test can be used) for the duration of the COVID-19 declaration under Se ction 564(b)(1) of the Act, 21 U.S.C. section 360bbb-3(b)(1), unless the authorization is terminated or revoked sooner.  Performed at [facility], [place] [address], [place], [place] [address]   Aerobic Culture w Gram Stain (superficial specimen)     Status: None   Collection Time: 03/26/21  5:00 PM   Specimen: Wound  Result Value Ref Range Status   Specimen Description   Final    WOUND Performed at [facility], [address] [place], [place] [address]    Special Requests   Final    TOE Performed at [facility], [place], [place] [address]    Gram Stain   Final    FEW WBC PRESENT,BOTH PMN AND MONONUCLEAR RARE GRAM POSITIVE COCCI Performed at [facility], [place] [address], [place], [place] [address]    Culture RARE STAPHYLOCOCCUS AUREUS  Final   Report Status 03/29/2021 FINAL  Final   Organism ID, Bacteria STAPHYLOCOCCUS AUREUS  Final      Susceptibility   Staphylococcus aureus - MIC*    CIPROFLOXACIN <=0.5 SENSITIVE Sensitive     ERYTHROMYCIN <=0.25 SENSITIVE Sensitive      GENTAMICIN <=0.5 SENSITIVE Sensitive     OXACILLIN 0.5 SENSITIVE Sensitive     TETRACYCLINE <=1 SENSITIVE Sensitive     VANCOMYCIN <=0.5 SENSITIVE Sensitive     TRIMETH/SULFA <=10 SENSITIVE Sensitive     CLINDAMYCIN <=0.25 SENSITIVE Sensitive     RIFAMPIN <=0.5 SENSITIVE Sensitive     Inducible Clindamycin NEGATIVE Sensitive     * RARE STAPHYLOCOCCUS AUREUS    Procedures and diagnostic studies:  No results found.             LOS: 6 days   Mrk Buzby  [facility]   Pager on [email or web address]. If 7PM-7AM, please contact night-coverage at [email or web address]     03/30/2021, 12:11 PM

## 2021-03-30 NOTE — Plan of Care (Signed)

## 2021-03-31 DIAGNOSIS — M86171 Other acute osteomyelitis, right ankle and foot: Secondary | ICD-10-CM | POA: Diagnosis not present

## 2021-03-31 LAB — GLUCOSE, CAPILLARY
Glucose-Capillary: 181 mg/dL — ABNORMAL HIGH (ref 70–99)
Glucose-Capillary: 165 mg/dL — ABNORMAL HIGH (ref 70–99)
Glucose-Capillary: 148 mg/dL — ABNORMAL HIGH (ref 70–99)
Glucose-Capillary: 133 mg/dL — ABNORMAL HIGH (ref 70–99)

## 2021-03-31 NOTE — Progress Notes (Signed)
[place] INFECTIOUS DISEASE PROGRESS NOTE Date of Admission:  03/24/2021     ID: Joshua [name] is a 50 y.o. 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 Still refusing amputation after discussion with Dr Cleda Mccreedy  ROS  Eleven systems are reviewed and negative except per hpi  Medications:  Antibiotics Given (last 72 hours)    Date/Time Action Medication Dose Rate   03/28/21 1419 Given   metroNIDAZOLE (FLAGYL) tablet 500 mg 500 mg    03/28/21 1421 New Bag/Given   ceFEPIme (MAXIPIME) 2 g in sodium chloride 0.9 % 100 mL IVPB 2 g 200 mL/hr   03/28/21 2158 Given   metroNIDAZOLE (FLAGYL) tablet 500 mg 500 mg    03/28/21 2205 New Bag/Given   ceFEPIme (MAXIPIME) 2 g in sodium chloride 0.9 % 100 mL IVPB 2 g 200 mL/hr   03/28/21 2333 New Bag/Given   vancomycin (VANCOREADY) IVPB 1250 mg/250 mL 1,250 mg 166.7 mL/hr   03/29/21 0508 Given   metroNIDAZOLE (FLAGYL) tablet 500 mg 500 mg    03/29/21 0510 New Bag/Given   ceFEPIme (MAXIPIME) 2 g in sodium chloride 0.9 % 100 mL IVPB 2 g 200 mL/hr   03/29/21 1301 New Bag/Given   ceFEPIme (MAXIPIME) 2 g in sodium chloride 0.9 % 100 mL IVPB 2 g 200 mL/hr   03/29/21 1342 New Bag/Given   vancomycin (VANCOREADY) IVPB 1250 mg/250 mL 1,250 mg 166.7 mL/hr   03/29/21 1523 Given   metroNIDAZOLE (FLAGYL) tablet 500 mg 500 mg    03/29/21 2122 Given   metroNIDAZOLE (FLAGYL) tablet 500 mg 500 mg    03/29/21 2123 New Bag/Given   ceFEPIme (MAXIPIME) 2 g in sodium chloride 0.9 % 100 mL IVPB 2 g 200 mL/hr   03/29/21 2347 New Bag/Given   vancomycin (VANCOREADY) IVPB 1250 mg/250 mL 1,250 mg 166.7 mL/hr   03/30/21 4010 Given   metroNIDAZOLE (FLAGYL) tablet 500 mg 500 mg    03/30/21 0513 New Bag/Given   ceFEPIme (MAXIPIME) 2 g in sodium chloride 0.9 % 100 mL IVPB 2 g 200 mL/hr   03/30/21 1132 New Bag/Given   vancomycin (VANCOREADY) IVPB 1250 mg/250 mL 1,250 mg 166.7 mL/hr    03/30/21 1518 Given   metroNIDAZOLE (FLAGYL) tablet 500 mg 500 mg    03/30/21 1520 New Bag/Given   ceFAZolin (ANCEF) IVPB 2g/100 mL premix 2 g 200 mL/hr   03/30/21 2139 Given   metroNIDAZOLE (FLAGYL) tablet 500 mg 500 mg    03/30/21 2149 New Bag/Given   ceFAZolin (ANCEF) IVPB 2g/100 mL premix 2 g 200 mL/hr   03/31/21 0516 Given   metroNIDAZOLE (FLAGYL) tablet 500 mg 500 mg    03/31/21 0517 New Bag/Given   ceFAZolin (ANCEF) IVPB 2g/100 mL premix 2 g 200 mL/hr     . ascorbic acid  250 mg Oral BID  . carvedilol  3.125 mg Oral BID WC  . dapagliflozin propanediol  5 mg Oral Daily  . enoxaparin (LOVENOX) injection  40 mg Subcutaneous Q24H  . fluticasone  2 spray Each Nare Daily  . gabapentin  100 mg Oral QHS  . insulin aspart  0-15 Units Subcutaneous TID AC & HS  . insulin glargine  42 Units Subcutaneous Daily  . losartan  25 mg Oral Daily  . metroNIDAZOLE  500 mg Oral Q8H  . multivitamin with minerals  1 tablet Oral Daily  . spironolactone  25 mg Oral Daily  Objective: Vital signs in last 24 hours: Temp:  [97.6 F (36.4 C)-98.3 F (36.8 C)] 98.3 F (36.8 C) (04/18 0742) Pulse Rate:  [70-92] 70 (04/18 0742) Resp:  [16-20] 17 (04/18 0742) BP: (105-119)/(70-82) 105/70 (04/18 0742) SpO2:  [98 %-100 %] 99 % (04/18 0742) Constitutional: He is oriented to person, place, and time. Disheveled HENT: anicteric  Mouth/Throat: Oropharynx is clear and moist. No oropharyngeal exudate.  Cardiovascular: Normal rate, regular rhythm and normal heart sounds. Exam reveals no gallop and no friction rub.  Pulmonary/Chest: Effort normal and breath sounds normal. No respiratory distress. He has no wheezes.  Abdominal: Soft. Bowel sounds are normal. He exhibits no distension. There is no tenderness.  Lymphadenopathy: He has no cervical adenopathy.  Neurological: He is alert and oriented to person, place, and time.  Skin: R great toe swollen to twice the size of L denuded skin.          Psychiatric: He has a normal mood and affect. His behavior is abnormal.   Lab Results Recent Labs    03/30/21 0614  CREATININE 0.91    Microbiology: Results for orders placed or performed during the hospital encounter of 03/24/21  Blood culture (routine x 2)     Status: None   Collection Time: 03/24/21 10:34 PM   Specimen: BLOOD  Result Value Ref Range Status   Specimen Description BLOOD BLOOD LEFT FOREARM  Final   Special Requests   Final    BOTTLES DRAWN AEROBIC AND ANAEROBIC Blood Culture results may not be optimal due to an excessive volume of blood received in culture bottles   Culture   Final    NO GROWTH 5 DAYS Performed at [facility], [address] [place], [place] [address]    Report Status 03/29/2021 FINAL  Final  Blood culture (routine x 2)     Status: None   Collection Time: 03/24/21 10:34 PM   Specimen: BLOOD  Result Value Ref Range Status   Specimen Description BLOOD LEFT ANTECUBITAL  Final   Special Requests   Final    BOTTLES DRAWN AEROBIC AND ANAEROBIC Blood Culture results may not be optimal due to an excessive volume of blood received in culture bottles   Culture   Final    NO GROWTH 5 DAYS Performed at [facility], [address] [place],  [address]    Report Status 03/29/2021 FINAL  Final  SARS CORONAVIRUS 2 (TAT 6-24 HRS) Nasopharyngeal Nasopharyngeal Swab     Status: None   Collection Time: 03/24/21 10:39 PM   Specimen: Nasopharyngeal Swab  Result Value Ref Range Status   SARS Coronavirus 2 NEGATIVE NEGATIVE Final    Comment: (NOTE) SARS-CoV-2 target nucleic acids are NOT DETECTED.  The SARS-CoV-2 RNA is generally detectable in upper and lower respiratory specimens during the acute phase of infection. Negative results do not preclude SARS-CoV-2 infection, do not rule out co-infections with other pathogens, and should not be used as the sole basis for treatment or other patient management  decisions. Negative results must be combined with clinical observations, patient history, and epidemiological information. The expected result is Negative.  Fact Sheet for Patients: SugarRoll.be  Fact Sheet for Healthcare Providers: https://www.woods-mathews.com/  This test is not yet approved or cleared by the [place] FDA and  has been authorized for detection and/or diagnosis of SARS-CoV-2 by FDA under an Emergency Use Authorization (EUA). This EUA will remain  in effect (meaning this test can be used) for the duration of  the COVID-19 declaration under Se ction 564(b)(1) of the Act, 21 U.S.C. section 360bbb-3(b)(1), unless the authorization is terminated or revoked sooner.  Performed at [facility], [place] [address], [place], [place] [address]   Aerobic Culture w Gram Stain (superficial specimen)     Status: None   Collection Time: 03/26/21  5:00 PM   Specimen: Wound  Result Value Ref Range Status   Specimen Description   Final    WOUND Performed at [facility], [address] [place], [place] [address]    Special Requests   Final    TOE Performed at [facility], [place], [place] [address]    Gram Stain   Final    FEW WBC PRESENT,BOTH PMN AND MONONUCLEAR RARE GRAM POSITIVE COCCI Performed at [facility], [place] [address] [place], [place] [address]    Culture RARE STAPHYLOCOCCUS AUREUS  Final   Report Status 03/29/2021 FINAL  Final   Organism ID, Bacteria STAPHYLOCOCCUS AUREUS  Final      Susceptibility   Staphylococcus aureus - MIC*    CIPROFLOXACIN <=0.5 SENSITIVE Sensitive     ERYTHROMYCIN <=0.25 SENSITIVE Sensitive     GENTAMICIN <=0.5 SENSITIVE Sensitive     OXACILLIN 0.5 SENSITIVE Sensitive     TETRACYCLINE <=1 SENSITIVE Sensitive     VANCOMYCIN <=0.5 SENSITIVE Sensitive     TRIMETH/SULFA <=10 SENSITIVE Sensitive     CLINDAMYCIN <=0.25 SENSITIVE Sensitive      RIFAMPIN <=0.5 SENSITIVE Sensitive     Inducible Clindamycin NEGATIVE Sensitive     * RARE STAPHYLOCOCCUS AUREUS    Studies/Results: MR TOES RIGHT W WO CONTRAST  Result Date: 03/31/2021 CLINICAL DATA:  Re-evaluation of osteomyelitis of the great toe. EXAM: MRI OF THE RIGHT TOES WITHOUT AND WITH CONTRAST TECHNIQUE: Multiplanar, multisequence MR imaging of the right forefoot was performed both before and after administration of intravenous contrast. CONTRAST:  43mL GADAVIST GADOBUTROL 1 MMOL/ML IV SOLN COMPARISON:  03/25/2021 FINDINGS: Bones/Joint/Cartilage Severe soft tissue edema of the great toe extending into the more proximal medial forefoot with enhancement most consistent with cellulitis. Complex fluid collection along the dorsal aspect of the great toe measuring 2 x 0.5 x 2.6 cm consistent with an abscess. Large skin blister along the medial aspect of the great toe. Severe bone marrow edema in the first distal phalanx and head of the first proximal phalanx most concerning for osteomyelitis. Mild osteoarthritis of the first MTP joint with subchondral reactive marrow changes. No acute fracture or dislocation. Normal alignment. No joint effusion. Ligaments Collateral ligaments are intact.  Lisfranc ligament is intact. Muscles and Tendons Flexor, peroneal and extensor compartment tendons are intact. Mild edema in the plantar musculature likely neurogenic. Soft tissue No other fluid collection or hematoma.  No soft tissue mass. IMPRESSION: 1. Severe cellulitis of the great toe extending into the more proximal medial forefoot. Complex fluid collection along the dorsal aspect of the great toe measuring 1.8 x 0.4 x 2.6 cm consistent with an abscess. Osteomyelitis of the first distal phalanx and to lesser extent head of the first proximal phalanx. No significant interval change compared with 03/25/2021. Electronically Signed   By: Kathreen Devoid   On: 03/31/2021 08:16    Assessment/Plan: Joshua [name]  is a 50 y.o. male with multiple medical issues including poorly controlled DM (A1c > 11), homelessness, CAD, CHF, polysubstance abuse (Cocaine and THC) who had developed a wound on distal R toe after pulling on a skin  scab (photo from 3/4) During early admission in March seen by podiatry and rec distal toe amputation but pt refused. He was rec to take augmentin but tells me he did not take. Readmitted late March with CP (Cocaine +) and PNA and again podiatry rec amputation but pt refused. He did start augmentin as otpt.  Now readmitted with fever, leukocytosis, MRI showing osteo of distal and prox phalnax as well as abscess over dorsal toe.  Only prior cx 3/4 mixed but also had rare  Enterobacter cloacae (No Staph aureus or Pseudomonas noted).  Started on vanco and cefepime. Podiatry rec toe amputation at this point but pt refuses again. I had a very long discussion with patient regarding the infection.  He is under the impression that the augmentin he was on caused the wound to worsen (although he tells me he did not take after first dc and took for a few days after second admit).  He tells me he has always had listed in his chart that he was allergic to penicillin but cannot tell me what the reaction was. Of note the prior notes suggest he did have gradual intro to amox at initial hospitalization and had no rash, or other signs of allergic reaction. Also has no eosinophilia.  He also tells me everyone he knows (100%) who had toe amputation went onto have total leg amputation. I spent significant time explaining to him that despite what has happened in past or to his friends, despite his perception that augmentin made the toe worse, we are faced with a potentially limb and life threatening infection. He needs IV abx and surgery. I reiterated this to him and advised risk of sepsis without surgical I and D esp with the abscess.  4/13- some drainage now from tip of toe. Thinks the pain up his leg is  improving 4/15 no fevers, wbc had come down from 17-9 4/18 - no fevers,. MRI done. Refusing amputation. Wound  cx MSSA Recommendations If refusing amputation would rec prolonged abx therapy although this is unlikely to heal the osteomyelitis. Discussed with with patient Can DC on bactrim DS BID For 6 weeks  Will need much better DM control as well. Thank you very much for the consult. Will follow with you.  Leonel Ramsay   03/31/2021, 1:58 PM

## 2021-03-31 NOTE — Progress Notes (Signed)
Progress Note    Joshua [name]  MWN:[id number] DOB: 1971-05-24  DOA: 03/24/2021 PCP: Patient, No Pcp Per (Inactive)      Brief Narrative:    Medical records reviewed and are as summarized below:  Joshua [name] is a 50 y.o. male with medical history significant for chronic systolic and diastolic CHF, type 2 diabetes mellitus, hypertension,Cocaine abuse, CAD, tobacco use disorder, chronic anemia, recent hospitalization for sepsis secondary to multifocal pneumonia and osteomyelitis of the right great toe.  He had been seen by podiatrist in the past and amputation of the right big toe had been recommended.  However, patient had refused to have amputation done and preferred to treat right big toe osteomyelitis with antibiotics.    He presented to the hospital because of pain and swelling extending from the right big toe through the foot to the right lower leg.  He also complained of redness and warmth of the right foot and right leg.  He was admitted to the hospital for sepsis secondary to acute on chronic osteomyelitis of the right big toe and right lower extremity cellulitis.      Assessment/Plan:   Active Problems:   Acute osteomyelitis of toe, right (HCC)    Body mass index is 26.1 kg/m.   Sepsis secondary to acute on chronic osteomyelitis of right big toe with cellulitis of right leg and foot: Continue current antibiotics.  Wound culture from the right big toe showed MSSA.  Repeat MRI of the big toe done 03/30/2021 showed severe cellulitis of the right great toe extending to the more proximal medial forefoot abscess in the right great toe.  There is also evidence of osteomyelitis of the right big toe.  Patient was  argumentative when I said the MRI findings with him. Consulted Dr. Vickki Muff, podiatrist, to discuss options with the patient again.  He still recommended amputation of the right big toe.  However, patient has refused to have any amputation.  Continue IV  antibiotics for now.  Hypertension: Continue antihypertensives  Type II DM with peripheral neuropathy: Continue Lantus 42 units daily.  Continue NovoLog as needed for hyperglycemia.  Monitor glucose levels closely.   Chronic systolic and diastolic CHF: Continue carvedilol, losartan and dapagliflozin. 2D echo on 07/16/2020 showed EF estimated at 25 to 30%,  grade 3 diastolic dysfunction, moderate MR  Diet Order            Diet heart healthy/carb modified Room service appropriate? Yes; Fluid consistency: Thin  Diet effective now                    Consultants:  Podiatrist  Infectious disease specialist  Procedures:  None    Medications:   . ascorbic acid  250 mg Oral BID  . carvedilol  3.125 mg Oral BID WC  . dapagliflozin propanediol  5 mg Oral Daily  . enoxaparin (LOVENOX) injection  40 mg Subcutaneous Q24H  . fluticasone  2 spray Each Nare Daily  . gabapentin  100 mg Oral QHS  . insulin aspart  0-15 Units Subcutaneous TID AC & HS  . insulin glargine  42 Units Subcutaneous Daily  . losartan  25 mg Oral Daily  . metroNIDAZOLE  500 mg Oral Q8H  . multivitamin with minerals  1 tablet Oral Daily  . spironolactone  25 mg Oral Daily   Continuous Infusions: . sodium chloride 100 mL/hr at 03/29/21 1759  .  ceFAZolin (ANCEF) IV 2 g (03/31/21 0517)  Anti-infectives (From admission, onward)   Start     Dose/Rate Route Frequency Ordered Stop   03/30/21 1400  ceFAZolin (ANCEF) IVPB 2g/100 mL premix        2 g 200 mL/hr over 30 Minutes Intravenous Every 8 hours 03/30/21 1211     03/25/21 2200  metroNIDAZOLE (FLAGYL) tablet 500 mg        500 mg Oral Every 8 hours 03/25/21 1839     03/25/21 1200  vancomycin (VANCOREADY) IVPB 1250 mg/250 mL  Status:  Discontinued        1,250 mg 166.7 mL/hr over 90 Minutes Intravenous Every 12 hours 03/25/21 0057 03/30/21 1211   03/25/21 0600  ceFEPIme (MAXIPIME) 2 g in sodium chloride 0.9 % 100 mL IVPB  Status:  Discontinued        2  g 200 mL/hr over 30 Minutes Intravenous Every 8 hours 03/25/21 0054 03/30/21 1211   03/24/21 2345  vancomycin (VANCOCIN) IVPB 1000 mg/200 mL premix  Status:  Discontinued        1,000 mg 200 mL/hr over 60 Minutes Intravenous  Once 03/24/21 2331 03/24/21 2349   03/24/21 2345  ceFEPIme (MAXIPIME) 2 g in sodium chloride 0.9 % 100 mL IVPB  Status:  Discontinued        2 g 200 mL/hr over 30 Minutes Intravenous  Once 03/24/21 2331 03/25/21 0049   03/24/21 2300  vancomycin (VANCOREADY) IVPB 1750 mg/350 mL        1,750 mg 175 mL/hr over 120 Minutes Intravenous  Once 03/24/21 2256 03/25/21 0208   03/24/21 2245  vancomycin (VANCOCIN) injection 1,750 mg  Status:  Discontinued        1,750 mg Intravenous  Once 03/24/21 2221 03/24/21 2255   03/24/21 2230  ceFEPIme (MAXIPIME) 2 g in sodium chloride 0.9 % 100 mL IVPB        2 g 200 mL/hr over 30 Minutes Intravenous  Once 03/24/21 2221 03/24/21 2359             Family Communication/Anticipated D/C date and plan/Code Status   DVT prophylaxis: enoxaparin (LOVENOX) injection 40 mg Start: 03/25/21 1600 SCDs Start: 03/24/21 2330     Code Status: Full Code  Family Communication: None Disposition Plan:    Status is: Inpatient  Remains inpatient appropriate because:IV treatments appropriate due to intensity of illness or inability to take PO and Inpatient level of care appropriate due to severity of illness   Dispo: The patient is from: Home              Anticipated d/c is to: Home              Patient currently is not medically stable to d/c.   Difficult to place patient No           Subjective:   C/o pain in the right big toe and the right foot.  Objective:    Vitals:   03/30/21 2021 03/30/21 2321 03/31/21 0513 03/31/21 0742  BP: 106/74 119/82 118/76 105/70  Pulse: 75 92 75 70  Resp: 20 18 19 17   Temp: 98.1 F (36.7 C) 98.3 F (36.8 C) 98.3 F (36.8 C) 98.3 F (36.8 C)  TempSrc: Oral Oral Oral   SpO2: 100% 100%  98% 99%  Weight:      Height:       No data found.   Intake/Output Summary (Last 24 hours) at 03/31/2021 1436 Last data filed at 03/31/2021 1424 Gross per 24 hour  Intake 1040 ml  Output 1600 ml  Net -560 ml   Filed Weights   03/24/21 2140  Weight: 82.5 kg    Exam:  GEN: NAD SKIN: No rash EYES: EOMI ENT: MMM CV: RRR PULM: CTA B ABD: soft, ND, NT, +BS CNS: AAO x 3, non focal EXT: Mild swelling and tenderness of the right big toe.  Some tenderness in the right foot.  No erythema.         Data Reviewed:   I have personally reviewed following labs and imaging studies:  Labs: Labs show the following:   Basic Metabolic Panel: Recent Labs  Lab 03/24/21 2144 03/25/21 0449 03/27/21 0451 03/28/21 0607 03/30/21 0614  NA 133* 135 138  --   --   K 4.2 4.0 4.3  --   --   CL 100 108 107  --   --   CO2 26 22 23   --   --   GLUCOSE 190* 157* 207*  --   --   BUN 8 8 19   --   --   CREATININE 0.91 0.68 1.00 0.93 0.91  CALCIUM 8.4* 7.7* 8.0*  --   --    GFR Estimated Creatinine Clearance: 101.4 mL/min (by C-G formula based on SCr of 0.91 mg/dL). Liver Function Tests: Recent Labs  Lab 03/24/21 2144  AST 15  ALT 10  ALKPHOS 63  BILITOT 0.8  PROT 7.2  ALBUMIN 3.3*   No results for input(s): LIPASE, AMYLASE in the last 168 hours. No results for input(s): AMMONIA in the last 168 hours. Coagulation profile No results for input(s): INR, PROTIME in the last 168 hours.  CBC: Recent Labs  Lab 03/24/21 2144 03/25/21 0449 03/27/21 0451  WBC 18.9* 17.6* 8.9  NEUTROABS 15.2*  --  5.1  HGB 10.4* 10.4* 9.3*  HCT 33.1* 33.5* 30.3*  MCV 83.6 84.0 84.9  PLT 424* 415* 413*   Cardiac Enzymes: No results for input(s): CKTOTAL, CKMB, CKMBINDEX, TROPONINI in the last 168 hours. BNP (last 3 results) No results for input(s): PROBNP in the last 8760 hours. CBG: Recent Labs  Lab 03/30/21 1158 03/30/21 1652 03/30/21 2137 03/31/21 0741 03/31/21 1219  GLUCAP 115* 174*  192* 181* 165*   D-Dimer: No results for input(s): DDIMER in the last 72 hours. Hgb A1c: No results for input(s): HGBA1C in the last 72 hours. Lipid Profile: No results for input(s): CHOL, HDL, LDLCALC, TRIG, CHOLHDL, LDLDIRECT in the last 72 hours. Thyroid function studies: No results for input(s): TSH, T4TOTAL, T3FREE, THYROIDAB in the last 72 hours.  Invalid input(s): FREET3 Anemia work up: No results for input(s): VITAMINB12, FOLATE, FERRITIN, TIBC, IRON, RETICCTPCT in the last 72 hours. Sepsis Labs: Recent Labs  Lab 03/24/21 2144 03/24/21 2234 03/25/21 0013 03/25/21 0449 03/27/21 0451  WBC 18.9*  --   --  17.6* 8.9  LATICACIDVEN  --  1.1 1.6  --   --     Microbiology Recent Results (from the past 240 hour(s))  Blood culture (routine x 2)     Status: None   Collection Time: 03/24/21 10:34 PM   Specimen: BLOOD  Result Value Ref Range Status   Specimen Description BLOOD BLOOD LEFT FOREARM  Final   Special Requests   Final    BOTTLES DRAWN AEROBIC AND ANAEROBIC Blood Culture results may not be optimal due to an excessive volume of blood received in culture bottles   Culture   Final    NO GROWTH 5 DAYS Performed at [facility]  [facility], [address] [place], [place] [address]    Report Status 03/29/2021 FINAL  Final  Blood culture (routine x 2)     Status: None   Collection Time: 03/24/21 10:34 PM   Specimen: BLOOD  Result Value Ref Range Status   Specimen Description BLOOD LEFT ANTECUBITAL  Final   Special Requests   Final    BOTTLES DRAWN AEROBIC AND ANAEROBIC Blood Culture results may not be optimal due to an excessive volume of blood received in culture bottles   Culture   Final    NO GROWTH 5 DAYS Performed at [facility], [address], [place], [place] [address]    Report Status 03/29/2021 FINAL  Final  SARS CORONAVIRUS 2 (TAT 6-24 HRS) Nasopharyngeal Nasopharyngeal Swab     Status: None   Collection Time: 03/24/21 10:39 PM   Specimen:  Nasopharyngeal Swab  Result Value Ref Range Status   SARS Coronavirus 2 NEGATIVE NEGATIVE Final    Comment: (NOTE) SARS-CoV-2 target nucleic acids are NOT DETECTED.  The SARS-CoV-2 RNA is generally detectable in upper and lower respiratory specimens during the acute phase of infection. Negative results do not preclude SARS-CoV-2 infection, do not rule out co-infections with other pathogens, and should not be used as the sole basis for treatment or other patient management decisions. Negative results must be combined with clinical observations, patient history, and epidemiological information. The expected result is Negative.  Fact Sheet for Patients: SugarRoll.be  Fact Sheet for Healthcare Providers: https://www.woods-mathews.com/  This test is not yet approved or cleared by the [place] FDA and  has been authorized for detection and/or diagnosis of SARS-CoV-2 by FDA under an Emergency Use Authorization (EUA). This EUA will remain  in effect (meaning this test can be used) for the duration of the COVID-19 declaration under Se ction 564(b)(1) of the Act, 21 U.S.C. section 360bbb-3(b)(1), unless the authorization is terminated or revoked sooner.  Performed at [facility], [place] [address] [place], [place] [address]   Aerobic Culture w Gram Stain (superficial specimen)     Status: None   Collection Time: 03/26/21  5:00 PM   Specimen: Wound  Result Value Ref Range Status   Specimen Description   Final    WOUND Performed at [facility], [address] [place], [place] [address]    Special Requests   Final    TOE Performed at [facility], [place], [place] [address]    Gram Stain   Final    FEW WBC PRESENT,BOTH PMN AND MONONUCLEAR RARE GRAM POSITIVE COCCI Performed at [facility], [place] [address], [place], [place] [address]    Culture RARE STAPHYLOCOCCUS AUREUS  Final   Report  Status 03/29/2021 FINAL  Final   Organism ID, Bacteria STAPHYLOCOCCUS AUREUS  Final      Susceptibility   Staphylococcus aureus - MIC*    CIPROFLOXACIN <=0.5 SENSITIVE Sensitive     ERYTHROMYCIN <=0.25 SENSITIVE Sensitive     GENTAMICIN <=0.5 SENSITIVE Sensitive     OXACILLIN 0.5 SENSITIVE Sensitive     TETRACYCLINE <=1 SENSITIVE Sensitive     VANCOMYCIN <=0.5 SENSITIVE Sensitive     TRIMETH/SULFA <=10 SENSITIVE Sensitive     CLINDAMYCIN <=0.25 SENSITIVE Sensitive     RIFAMPIN <=0.5 SENSITIVE Sensitive     Inducible Clindamycin NEGATIVE Sensitive     * RARE STAPHYLOCOCCUS AUREUS    Procedures and diagnostic studies:  MR TOES RIGHT W WO CONTRAST  Result Date: 03/31/2021 CLINICAL DATA:  Re-evaluation of osteomyelitis of the great toe. EXAM: MRI OF THE RIGHT TOES WITHOUT AND WITH CONTRAST TECHNIQUE: Multiplanar, multisequence MR imaging of the right forefoot was performed both before and after administration of intravenous contrast. CONTRAST:  45mL GADAVIST GADOBUTROL 1 MMOL/ML IV SOLN COMPARISON:  03/25/2021 FINDINGS: Bones/Joint/Cartilage Severe soft tissue edema of the great toe extending into the more proximal medial forefoot with enhancement most consistent with cellulitis. Complex fluid collection along the dorsal aspect of the great toe measuring 2 x 0.5 x 2.6 cm consistent with an abscess. Large skin blister along the medial aspect of the great toe. Severe bone marrow edema in the first distal phalanx and head of the first proximal phalanx most concerning for osteomyelitis. Mild osteoarthritis of the first MTP joint with subchondral reactive marrow changes. No acute fracture or dislocation. Normal alignment. No joint effusion. Ligaments Collateral ligaments are intact.  Lisfranc ligament is intact. Muscles and Tendons Flexor, peroneal and extensor compartment tendons are intact. Mild edema in the plantar musculature likely neurogenic. Soft tissue No other fluid collection or hematoma.  No  soft tissue mass. IMPRESSION: 1. Severe cellulitis of the great toe extending into the more proximal medial forefoot. Complex fluid collection along the dorsal aspect of the great toe measuring 1.8 x 0.4 x 2.6 cm consistent with an abscess. Osteomyelitis of the first distal phalanx and to lesser extent head of the first proximal phalanx. No significant interval change compared with 03/25/2021. Electronically Signed   By: Kathreen Devoid   On: 03/31/2021 08:16               LOS: 7 days   Gerasimos Plotts  [facility]   Pager on [email or web address]. If 7PM-7AM, please contact night-coverage at [email or web address]     03/31/2021, 2:36 PM

## 2021-03-31 NOTE — H&P (View-Only) (Signed)
Patient ID: Joshua [name], male   DOB: [birth year], 50 y.o.   MRN: [id number] Patient seen.  Informed by the nursing staff that the patient has agreed for amputation of his right great toe.  Spoke with the patient about amputation of the right great toe and performing this tomorrow.  Discussed that we can hopefully remove the entire toe and eliminate his infection.  Discussed possible risks and complications of the procedure and anesthesia including but not limited to inability of the wound to heal due to the extent of infection as well as his diabetes.  Discussed that if the scan is in poor shape due to the diabetes this could necessitate needing to take some of his metatarsal out to allow for closure.  Questions invited and answered.  No guarantees can be given as to the outcome of surgery.  Obtain consent for amputation of the right great toe.  N.p.o. after midnight.  Plan for surgery tomorrow afternoon.

## 2021-03-31 NOTE — Plan of Care (Signed)
Patient had an uneventful shift. No changes in neurological and neurovascular assessments. Pain controlled with PRN medications. Vital signs within normal range.Denies any needs at this time. All Safety measures maintained. Care continues.  Problem: Education: Goal: Knowledge of General Education information will improve Description: Including pain rating scale, medication(s)/side effects and non-pharmacologic comfort measures Outcome: Progressing   Problem: Health Behavior/Discharge Planning: Goal: Ability to manage health-related needs will improve Outcome: Progressing   Problem: Clinical Measurements: Goal: Ability to maintain clinical measurements within normal limits will improve Outcome: Progressing Goal: Will remain free from infection Outcome: Progressing Goal: Diagnostic test results will improve Outcome: Progressing Goal: Respiratory complications will improve Outcome: Progressing Goal: Cardiovascular complication will be avoided Outcome: Progressing   Problem: Activity: Goal: Risk for activity intolerance will decrease Outcome: Progressing   Problem: Nutrition: Goal: Adequate nutrition will be maintained Outcome: Progressing   Problem: Coping: Goal: Level of anxiety will decrease Outcome: Progressing   Problem: Elimination: Goal: Will not experience complications related to bowel motility Outcome: Progressing Goal: Will not experience complications related to urinary retention Outcome: Progressing   Problem: Pain Managment: Goal: General experience of comfort will improve Outcome: Progressing   Problem: Safety: Goal: Ability to remain free from injury will improve Outcome: Progressing   Problem: Skin Integrity: Goal: Risk for impaired skin integrity will decrease Outcome: Progressing

## 2021-03-31 NOTE — Progress Notes (Signed)
Patient ID: Joshua [name], male   DOB: [birth year], 50 y.o.   MRN: [id number] Patient seen.  Informed by the nursing staff that the patient has agreed for amputation of his right great toe.  Spoke with the patient about amputation of the right great toe and performing this tomorrow.  Discussed that we can hopefully remove the entire toe and eliminate his infection.  Discussed possible risks and complications of the procedure and anesthesia including but not limited to inability of the wound to heal due to the extent of infection as well as his diabetes.  Discussed that if the scan is in poor shape due to the diabetes this could necessitate needing to take some of his metatarsal out to allow for closure.  Questions invited and answered.  No guarantees can be given as to the outcome of surgery.  Obtain consent for amputation of the right great toe.  N.p.o. after midnight.  Plan for surgery tomorrow afternoon.

## 2021-03-31 NOTE — Progress Notes (Signed)
Subjective/Chief Complaint: Patient seen.   Objective: Vital signs in last 24 hours: Temp:  [97.6 F (36.4 C)-98.3 F (36.8 C)] 98.3 F (36.8 C) (04/18 0742) Pulse Rate:  [70-92] 70 (04/18 0742) Resp:  [16-20] 17 (04/18 0742) BP: (105-119)/(70-82) 105/70 (04/18 0742) SpO2:  [98 %-100 %] 99 % (04/18 0742) Last BM Date: 03/26/21  Intake/Output from previous day: 04/17 0701 - 04/18 0700 In: 920 [P.O.:720; IV Piggyback:200] Out: 900 [Urine:900] Intake/Output this shift: Total I/O In: 480 [P.O.:480] Out: 1200 [Urine:1200]  Dressing intact on the great toe.  Independently reviewed the MRI as well as the radiology read.  No significant interval change from previous MRI last week.  Lab Results:  No results for input(s): WBC, HGB, HCT, PLT in the last 72 hours. BMET Recent Labs    03/30/21 0614  CREATININE 0.91   PT/INR No results for input(s): LABPROT, INR in the last 72 hours. ABG No results for input(s): PHART, HCO3 in the last 72 hours.  Invalid input(s): PCO2, PO2  Studies/Results: MR TOES RIGHT W WO CONTRAST  Result Date: 03/31/2021 CLINICAL DATA:  Re-evaluation of osteomyelitis of the great toe. EXAM: MRI OF THE RIGHT TOES WITHOUT AND WITH CONTRAST TECHNIQUE: Multiplanar, multisequence MR imaging of the right forefoot was performed both before and after administration of intravenous contrast. CONTRAST:  24mL GADAVIST GADOBUTROL 1 MMOL/ML IV SOLN COMPARISON:  03/25/2021 FINDINGS: Bones/Joint/Cartilage Severe soft tissue edema of the great toe extending into the more proximal medial forefoot with enhancement most consistent with cellulitis. Complex fluid collection along the dorsal aspect of the great toe measuring 2 x 0.5 x 2.6 cm consistent with an abscess. Large skin blister along the medial aspect of the great toe. Severe bone marrow edema in the first distal phalanx and head of the first proximal phalanx most concerning for osteomyelitis. Mild osteoarthritis of the  first MTP joint with subchondral reactive marrow changes. No acute fracture or dislocation. Normal alignment. No joint effusion. Ligaments Collateral ligaments are intact.  Lisfranc ligament is intact. Muscles and Tendons Flexor, peroneal and extensor compartment tendons are intact. Mild edema in the plantar musculature likely neurogenic. Soft tissue No other fluid collection or hematoma.  No soft tissue mass. IMPRESSION: 1. Severe cellulitis of the great toe extending into the more proximal medial forefoot. Complex fluid collection along the dorsal aspect of the great toe measuring 1.8 x 0.4 x 2.6 cm consistent with an abscess. Osteomyelitis of the first distal phalanx and to lesser extent head of the first proximal phalanx. No significant interval change compared with 03/25/2021. Electronically Signed   By: Kathreen Devoid   On: 03/31/2021 08:16    Anti-infectives: Anti-infectives (From admission, onward)   Start     Dose/Rate Route Frequency Ordered Stop   03/30/21 1400  ceFAZolin (ANCEF) IVPB 2g/100 mL premix        2 g 200 mL/hr over 30 Minutes Intravenous Every 8 hours 03/30/21 1211     03/25/21 2200  metroNIDAZOLE (FLAGYL) tablet 500 mg        500 mg Oral Every 8 hours 03/25/21 1839     03/25/21 1200  vancomycin (VANCOREADY) IVPB 1250 mg/250 mL  Status:  Discontinued        1,250 mg 166.7 mL/hr over 90 Minutes Intravenous Every 12 hours 03/25/21 0057 03/30/21 1211   03/25/21 0600  ceFEPIme (MAXIPIME) 2 g in sodium chloride 0.9 % 100 mL IVPB  Status:  Discontinued        2 g  200 mL/hr over 30 Minutes Intravenous Every 8 hours 03/25/21 0054 03/30/21 1211   03/24/21 2345  vancomycin (VANCOCIN) IVPB 1000 mg/200 mL premix  Status:  Discontinued        1,000 mg 200 mL/hr over 60 Minutes Intravenous  Once 03/24/21 2331 03/24/21 2349   03/24/21 2345  ceFEPIme (MAXIPIME) 2 g in sodium chloride 0.9 % 100 mL IVPB  Status:  Discontinued        2 g 200 mL/hr over 30 Minutes Intravenous  Once 03/24/21  2331 03/25/21 0049   03/24/21 2300  vancomycin (VANCOREADY) IVPB 1750 mg/350 mL        1,750 mg 175 mL/hr over 120 Minutes Intravenous  Once 03/24/21 2256 03/25/21 0208   03/24/21 2245  vancomycin (VANCOCIN) injection 1,750 mg  Status:  Discontinued        1,750 mg Intravenous  Once 03/24/21 2221 03/24/21 2255   03/24/21 2230  ceFEPIme (MAXIPIME) 2 g in sodium chloride 0.9 % 100 mL IVPB        2 g 200 mL/hr over 30 Minutes Intravenous  Once 03/24/21 2221 03/24/21 2359      Assessment/Plan: s/p * No surgery found * Assessment: Osteomyelitis right great toe.   Plan: Had a lengthy discussion with the patient again today about the severity of the infection in his right great toe.  Discussed with the patient that his toe is not going to heal or get any better and that he needs to consider amputation of the toe.  Discussed that if he does not have the toe amputated that the infection will likely spread further on up into the foot and he may require a more aggressive amputation including a portion of the ray or forefoot and potentially could lead up into the leg.  Patient once again is hesitant and states that our story keeps changing about what needs to happen with his toe.  Discussed with the patient that amputation was recommended 6 weeks ago when he was first in the hospital and at that point it may have only been the tip of the toe.  Discussed that amputation was again recommended when he was in the hospital a few weeks ago which he refused at that time as well.  Discussed that since he has delayed his decision the infection is now spread into both bones in the big toe which will require amputation of the entire great toe.  Discussed with the patient that we could perform amputation tomorrow afternoon if he would like to consent but he states he is still not ready.  Feels like he cannot trust [place].  States he would like to look at the x-rays and MRI himself.  Patient states that he would like to just be  discharged but again discussed with the patient that his foot will only get worse.  States he may give amputation 1 more consideration but would like to discuss this with Dr. Vickki Muff who saw him initially.  I do not really know if that will be beneficial but if the patient is still in the hospital we can see if Dr. Vickki Muff will be gracious enough to see him.  Ultimately if patient will not consent to amputation of his great toe I think he may be better served at a another medical institution for second opinion although I did discuss with the patient that no matter where he goes they are going to recommend amputation of his great toe.  LOS: 7 days  [name] 03/31/2021

## 2021-04-01 ENCOUNTER — Inpatient Hospital Stay: Payer: Medicaid Other | Admitting: Registered Nurse

## 2021-04-01 ENCOUNTER — Encounter: Payer: Self-pay | Admitting: Family Medicine

## 2021-04-01 ENCOUNTER — Encounter
Admission: EM | Disposition: A | Discharge status: Home / Self Care | Payer: Self-pay | Source: Home / Self Care | Attending: Internal Medicine

## 2021-04-01 DIAGNOSIS — I5042 Chronic combined systolic (congestive) and diastolic (congestive) heart failure: Secondary | ICD-10-CM

## 2021-04-01 DIAGNOSIS — M86171 Other acute osteomyelitis, right ankle and foot: Secondary | ICD-10-CM | POA: Diagnosis not present

## 2021-04-01 HISTORY — PX: AMPUTATION TOE: SHX6595

## 2021-04-01 LAB — GLUCOSE, CAPILLARY
Glucose-Capillary: 106 mg/dL — ABNORMAL HIGH (ref 70–99)
Glucose-Capillary: 95 mg/dL (ref 70–99)
Glucose-Capillary: 94 mg/dL (ref 70–99)
Glucose-Capillary: 93 mg/dL (ref 70–99)
Glucose-Capillary: 135 mg/dL — ABNORMAL HIGH (ref 70–99)
Glucose-Capillary: 199 mg/dL — ABNORMAL HIGH (ref 70–99)

## 2021-04-01 SURGERY — AMPUTATION, TOE
Anesthesia: General | Site: Toe | Laterality: Right

## 2021-04-01 MED ORDER — MIDAZOLAM HCL 2 MG/2ML IJ SOLN
INTRAMUSCULAR | Status: AC
  Filled 2021-04-01: qty 2

## 2021-04-01 MED ORDER — DEXMEDETOMIDINE (PRECEDEX) IN NS 20 MCG/5ML (4 MCG/ML) IV SYRINGE
PREFILLED_SYRINGE | INTRAVENOUS | Status: DC | PRN
  Administered 2021-04-01 (×2): 4 ug via INTRAVENOUS

## 2021-04-01 MED ORDER — FLUMAZENIL 0.5 MG/5ML IV SOLN: 0.2000 mg | Freq: Once | INTRAVENOUS | Status: AC

## 2021-04-01 MED ORDER — FLUMAZENIL 0.5 MG/5ML IV SOLN
INTRAVENOUS | Status: AC
  Filled 2021-04-01: qty 5

## 2021-04-01 MED ORDER — EPHEDRINE SULFATE 50 MG/ML IJ SOLN
INTRAMUSCULAR | Status: AC
  Administered 2021-04-01: 20 mg via INTRAVENOUS
  Filled 2021-04-01: qty 1

## 2021-04-01 MED ORDER — DEXTROSE-NACL 5-0.9 % IV SOLN: INTRAVENOUS | Status: AC

## 2021-04-01 MED ORDER — ONDANSETRON HCL 4 MG/2ML IJ SOLN
INTRAMUSCULAR | Status: DC | PRN
  Administered 2021-04-01: 4 mg via INTRAVENOUS

## 2021-04-01 MED ORDER — PROPOFOL 10 MG/ML IV BOLUS
INTRAVENOUS | Status: DC | PRN
  Administered 2021-04-01: 150 mg via INTRAVENOUS

## 2021-04-01 MED ORDER — PHENYLEPHRINE HCL (PRESSORS) 10 MG/ML IV SOLN
INTRAVENOUS | Status: DC | PRN
  Administered 2021-04-01: 50 ug via INTRAVENOUS
  Administered 2021-04-01 (×2): 200 ug via INTRAVENOUS
  Administered 2021-04-01: 100 ug via INTRAVENOUS

## 2021-04-01 MED ORDER — LIDOCAINE HCL (PF) 2 % IJ SOLN
INTRAMUSCULAR | Status: AC
  Filled 2021-04-01: qty 5

## 2021-04-01 MED ORDER — CEFAZOLIN SODIUM-DEXTROSE 2-4 GM/100ML-% IV SOLN
INTRAVENOUS | Status: AC
  Filled 2021-04-01: qty 100

## 2021-04-01 MED ORDER — FENTANYL CITRATE (PF) 100 MCG/2ML IJ SOLN
INTRAMUSCULAR | Status: DC | PRN
  Administered 2021-04-01 (×3): 25 ug via INTRAVENOUS

## 2021-04-01 MED ORDER — SODIUM CHLORIDE FLUSH 0.9 % IV SOLN
INTRAVENOUS | Status: AC
  Filled 2021-04-01: qty 10

## 2021-04-01 MED ORDER — MIDAZOLAM HCL 2 MG/2ML IJ SOLN
INTRAMUSCULAR | Status: DC | PRN
  Administered 2021-04-01: 2 mg via INTRAVENOUS

## 2021-04-01 MED ORDER — FENTANYL CITRATE (PF) 100 MCG/2ML IJ SOLN
INTRAMUSCULAR | Status: AC
  Administered 2021-04-01: 25 ug via INTRAVENOUS
  Filled 2021-04-01: qty 2

## 2021-04-01 MED ORDER — OXYCODONE HCL 5 MG PO TABS
5.0000 mg | ORAL_TABLET | ORAL | Status: DC | PRN
Start: 2021-04-01 — End: 2021-04-05
  Administered 2021-04-01 – 2021-04-05 (×13): 5 mg via ORAL
  Filled 2021-04-01 (×14): qty 1

## 2021-04-01 MED ORDER — FLUMAZENIL 0.5 MG/5ML IV SOLN
INTRAVENOUS | Status: AC
  Administered 2021-04-01: 0.2 mg via INTRAVENOUS
  Filled 2021-04-01: qty 5

## 2021-04-01 MED ORDER — EPHEDRINE SULFATE 50 MG/ML IJ SOLN: 20.0000 mg | Freq: Once | INTRAMUSCULAR | Status: AC

## 2021-04-01 MED ORDER — ONDANSETRON HCL 4 MG/2ML IJ SOLN
4.0000 mg | Freq: Once | INTRAMUSCULAR | Status: DC | PRN
Start: 2021-04-01 — End: 2021-04-01

## 2021-04-01 MED ORDER — FENTANYL CITRATE (PF) 100 MCG/2ML IJ SOLN
25.0000 ug | INTRAMUSCULAR | Status: DC | PRN
  Administered 2021-04-01 (×2): 25 ug via INTRAVENOUS

## 2021-04-01 MED ORDER — MORPHINE SULFATE (PF) 2 MG/ML IV SOLN
2.0000 mg | INTRAVENOUS | Status: DC | PRN
Start: 2021-04-01 — End: 2021-04-05
  Administered 2021-04-01 – 2021-04-02 (×3): 2 mg via INTRAVENOUS
  Filled 2021-04-01 (×3): qty 1

## 2021-04-01 MED ORDER — ACETAMINOPHEN 10 MG/ML IV SOLN
INTRAVENOUS | Status: DC | PRN
  Administered 2021-04-01: 1000 mg via INTRAVENOUS

## 2021-04-01 MED ORDER — DEXMEDETOMIDINE (PRECEDEX) IN NS 20 MCG/5ML (4 MCG/ML) IV SYRINGE
PREFILLED_SYRINGE | INTRAVENOUS | Status: AC
  Filled 2021-04-01: qty 5

## 2021-04-01 MED ORDER — ONDANSETRON HCL 4 MG/2ML IJ SOLN
INTRAMUSCULAR | Status: AC
  Filled 2021-04-01: qty 2

## 2021-04-01 MED ORDER — SODIUM CHLORIDE 0.9 % IV SOLN: INTRAVENOUS | Status: DC | PRN

## 2021-04-01 MED ORDER — ACETAMINOPHEN 10 MG/ML IV SOLN
INTRAVENOUS | Status: AC
  Filled 2021-04-01: qty 100

## 2021-04-01 MED ORDER — BUPIVACAINE HCL 0.5 % IJ SOLN
INTRAMUSCULAR | Status: DC | PRN
  Administered 2021-04-01: 10 mL

## 2021-04-01 MED ORDER — LIDOCAINE HCL (CARDIAC) PF 100 MG/5ML IV SOSY
PREFILLED_SYRINGE | INTRAVENOUS | Status: DC | PRN
  Administered 2021-04-01: 80 mg via INTRAVENOUS

## 2021-04-01 MED ORDER — FENTANYL CITRATE (PF) 100 MCG/2ML IJ SOLN
INTRAMUSCULAR | Status: AC
  Filled 2021-04-01: qty 2

## 2021-04-01 SURGICAL SUPPLY — 47 items
BLADE MED AGGRESSIVE (BLADE) ×2 IMPLANT
BLADE OSC/SAGITTAL MD 5.5X18 (BLADE) ×2 IMPLANT
BLADE SURG 15 STRL LF DISP TIS (BLADE) ×2 IMPLANT
BLADE SURG 15 STRL SS (BLADE) ×4
BLADE SURG MINI STRL (BLADE) ×2 IMPLANT
BNDG CONFORM 2 STRL LF (GAUZE/BANDAGES/DRESSINGS) ×2 IMPLANT
BNDG ELASTIC 4X5.8 VLCR STR LF (GAUZE/BANDAGES/DRESSINGS) ×2 IMPLANT
BNDG ESMARK 4X12 TAN STRL LF (GAUZE/BANDAGES/DRESSINGS) ×2 IMPLANT
BNDG GAUZE 4.5X4.1 6PLY STRL (MISCELLANEOUS) ×2 IMPLANT
CANISTER SUCT 1200ML W/VALVE (MISCELLANEOUS) ×2 IMPLANT
COVER WAND RF STERILE (DRAPES) ×2 IMPLANT
CUFF TOURN SGL QUICK 12 (TOURNIQUET CUFF) ×2 IMPLANT
CUFF TOURN SGL QUICK 18X4 (TOURNIQUET CUFF) ×2 IMPLANT
DRAPE FLUOR MINI C-ARM 54X84 (DRAPES) ×2 IMPLANT
DURAPREP 26ML APPLICATOR (WOUND CARE) ×2 IMPLANT
ELECT REM PT RETURN 9FT ADLT (ELECTROSURGICAL) ×2
ELECTRODE REM PT RTRN 9FT ADLT (ELECTROSURGICAL) ×1 IMPLANT
GAUZE SPONGE 4X4 12PLY STRL (GAUZE/BANDAGES/DRESSINGS) ×2 IMPLANT
GAUZE XEROFORM 1X8 LF (GAUZE/BANDAGES/DRESSINGS) ×2 IMPLANT
GLOVE SURG ENC MOIS LTX SZ7.5 (GLOVE) ×2 IMPLANT
GLOVE SURG UNDER LTX SZ8 (GLOVE) ×2 IMPLANT
GOWN STRL REUS W/ TWL LRG LVL3 (GOWN DISPOSABLE) ×2 IMPLANT
GOWN STRL REUS W/TWL LRG LVL3 (GOWN DISPOSABLE) ×4
HANDPIECE VERSAJET DEBRIDEMENT (MISCELLANEOUS) ×2 IMPLANT
IV NS IRRIG 3000ML ARTHROMATIC (IV SOLUTION) ×2 IMPLANT
KIT TURNOVER KIT A (KITS) ×2 IMPLANT
LABEL OR SOLS (LABEL) ×2 IMPLANT
MANIFOLD NEPTUNE II (INSTRUMENTS) ×2 IMPLANT
NDL FILTER BLUNT 18X1 1/2 (NEEDLE) ×1 IMPLANT
NDL HYPO 25X1 1.5 SAFETY (NEEDLE) ×2 IMPLANT
NEEDLE FILTER BLUNT 18X 1/2SAF (NEEDLE) ×1
NEEDLE FILTER BLUNT 18X1 1/2 (NEEDLE) ×1 IMPLANT
NEEDLE HYPO 25X1 1.5 SAFETY (NEEDLE) ×4 IMPLANT
NS IRRIG 500ML POUR BTL (IV SOLUTION) ×2 IMPLANT
PACK EXTREMITY ARMC (MISCELLANEOUS) ×2 IMPLANT
SOL PREP PVP 2OZ (MISCELLANEOUS) ×2
SOLUTION PREP PVP 2OZ (MISCELLANEOUS) ×1 IMPLANT
STOCKINETTE BIAS CUT 4 980044 (GAUZE/BANDAGES/DRESSINGS) ×2 IMPLANT
STOCKINETTE STRL 6IN 960660 (GAUZE/BANDAGES/DRESSINGS) ×2 IMPLANT
STRIP CLOSURE SKIN 1/4X4 (GAUZE/BANDAGES/DRESSINGS) ×2 IMPLANT
SUT ETHILON 3-0 FS-10 30 BLK (SUTURE) ×2
SUT ETHILON 4-0 (SUTURE)
SUT ETHILON 4-0 FS2 18XMFL BLK (SUTURE)
SUTURE EHLN 3-0 FS-10 30 BLK (SUTURE) ×1 IMPLANT
SUTURE ETHLN 4-0 FS2 18XMF BLK (SUTURE) IMPLANT
SWAB CULTURE AMIES ANAERIB BLU (MISCELLANEOUS) ×2 IMPLANT
SYR 10ML LL (SYRINGE) ×2 IMPLANT

## 2021-04-01 NOTE — Progress Notes (Addendum)
Progress Note    Joshua [name]  XFG:[id number] DOB: 08-25-1971  DOA: 03/24/2021 PCP: Patient, No Pcp Per (Inactive)      Brief Narrative:    Medical records reviewed and are as summarized below:  Joshua [name] is a 50 y.o. male with medical history significant for chronic systolic and diastolic CHF, type 2 diabetes mellitus, hypertension,Cocaine abuse, CAD, tobacco use disorder, chronic anemia, recent hospitalization for sepsis secondary to multifocal pneumonia and osteomyelitis of the right great toe.  He had been seen by podiatrist in the past and amputation of the right big toe had been recommended.  However, patient had refused to have amputation done and preferred to treat right big toe osteomyelitis with antibiotics.    He presented to the hospital because of pain and swelling extending from the right big toe through the foot to the right lower leg.  He also complained of redness and warmth of the right foot and right leg.  He was admitted to the hospital for sepsis secondary to acute on chronic osteomyelitis of the right big toe and right lower extremity cellulitis.      Assessment/Plan:   Active Problems:   Chronic combined systolic (congestive) and diastolic (congestive) heart failure (HCC)   Acute osteomyelitis of toe, right (HCC)    Body mass index is 26.1 kg/m.   Sepsis secondary to acute on chronic osteomyelitis of right big toe with cellulitis of right leg and foot: Continue IV cefazolin Wound culture from the right big toe showed MSSA.  Repeat MRI of the big toe done 03/30/2021 showed severe cellulitis of the right great toe extending to the more proximal medial forefoot abscess in the right great toe.  There is also evidence of osteomyelitis of the right big toe.  Continue antibiotics. Patient has agreed to amputation of right big toe and surgery scheduled for this afternoon.  Follow-up with podiatrist.  Hypertension: Continue  antihypertensives  Type II DM with peripheral neuropathy: Continue Lantus 42 units daily.  Continue dapagliflozin.  Continue NovoLog as needed for hyperglycemia.  Monitor glucose levels closely.   Chronic systolic and diastolic CHF, CAD (previous MI): Continue carvedilol and losartan.   2D echo on 07/16/2020 showed EF estimated at 25 to 30%,  grade 3 diastolic dysfunction, moderate MR  Diet Order            Diet NPO time specified  Diet effective midnight                    Consultants:  Podiatrist  Infectious disease specialist  Procedures:  None    Medications:   . ascorbic acid  250 mg Oral BID  . carvedilol  3.125 mg Oral BID WC  . dapagliflozin propanediol  5 mg Oral Daily  . enoxaparin (LOVENOX) injection  40 mg Subcutaneous Q24H  . fluticasone  2 spray Each Nare Daily  . gabapentin  100 mg Oral QHS  . insulin aspart  0-15 Units Subcutaneous TID AC & HS  . insulin glargine  42 Units Subcutaneous Daily  . losartan  25 mg Oral Daily  . metroNIDAZOLE  500 mg Oral Q8H  . multivitamin with minerals  1 tablet Oral Daily  . spironolactone  25 mg Oral Daily   Continuous Infusions: .  ceFAZolin (ANCEF) IV 2 g (04/01/21 0522)     Anti-infectives (From admission, onward)   Start     Dose/Rate Route Frequency Ordered Stop   03/30/21 1400  ceFAZolin (  ANCEF) IVPB 2g/100 mL premix        2 g 200 mL/hr over 30 Minutes Intravenous Every 8 hours 03/30/21 1211     03/25/21 2200  metroNIDAZOLE (FLAGYL) tablet 500 mg        500 mg Oral Every 8 hours 03/25/21 1839     03/25/21 1200  vancomycin (VANCOREADY) IVPB 1250 mg/250 mL  Status:  Discontinued        1,250 mg 166.7 mL/hr over 90 Minutes Intravenous Every 12 hours 03/25/21 0057 03/30/21 1211   03/25/21 0600  ceFEPIme (MAXIPIME) 2 g in sodium chloride 0.9 % 100 mL IVPB  Status:  Discontinued        2 g 200 mL/hr over 30 Minutes Intravenous Every 8 hours 03/25/21 0054 03/30/21 1211   03/24/21 2345  vancomycin  (VANCOCIN) IVPB 1000 mg/200 mL premix  Status:  Discontinued        1,000 mg 200 mL/hr over 60 Minutes Intravenous  Once 03/24/21 2331 03/24/21 2349   03/24/21 2345  ceFEPIme (MAXIPIME) 2 g in sodium chloride 0.9 % 100 mL IVPB  Status:  Discontinued        2 g 200 mL/hr over 30 Minutes Intravenous  Once 03/24/21 2331 03/25/21 0049   03/24/21 2300  vancomycin (VANCOREADY) IVPB 1750 mg/350 mL        1,750 mg 175 mL/hr over 120 Minutes Intravenous  Once 03/24/21 2256 03/25/21 0208   03/24/21 2245  vancomycin (VANCOCIN) injection 1,750 mg  Status:  Discontinued        1,750 mg Intravenous  Once 03/24/21 2221 03/24/21 2255   03/24/21 2230  ceFEPIme (MAXIPIME) 2 g in sodium chloride 0.9 % 100 mL IVPB        2 g 200 mL/hr over 30 Minutes Intravenous  Once 03/24/21 2221 03/24/21 2359             Family Communication/Anticipated D/C date and plan/Code Status   DVT prophylaxis: enoxaparin (LOVENOX) injection 40 mg Start: 03/25/21 1600 SCDs Start: 03/24/21 2330     Code Status: Full Code  Family Communication: None Disposition Plan:    Status is: Inpatient  Remains inpatient appropriate because:IV treatments appropriate due to intensity of illness or inability to take PO and Inpatient level of care appropriate due to severity of illness   Dispo: The patient is from: Home              Anticipated d/c is to: Home              Patient currently is not medically stable to d/c.   Difficult to place patient No           Subjective:   Interval events noted.  He still has pain in the right foot.  Objective:    Vitals:   03/31/21 1957 04/01/21 0001 04/01/21 0416 04/01/21 0822  BP: 116/77 110/72 114/82 105/76  Pulse: 75 70 72 70  Resp: 17 18 16 16   Temp: 98.2 F (36.8 C) 98.3 F (36.8 C) 98.3 F (36.8 C) (!) 97.5 F (36.4 C)  TempSrc:    Oral  SpO2: 100% 100% 100% 97%  Weight:      Height:       No data found.   Intake/Output Summary (Last 24 hours) at  04/01/2021 1250 Last data filed at 04/01/2021 0417 Gross per 24 hour  Intake 6032.77 ml  Output 1775 ml  Net 4257.77 ml   Filed Weights   03/24/21 2140  Weight: 82.5 kg    Exam:  GEN: NAD SKIN: No rash EYES: EOMI ENT: MMM CV: RRR PULM: CTA B ABD: soft, ND, NT, +BS CNS: AAO x 3, non focal EXT: Mild swelling of the right great toe.  Mild tenderness of the right great toe, right foot and distal leg.  No erythema.          Data Reviewed:   I have personally reviewed following labs and imaging studies:  Labs: Labs show the following:   Basic Metabolic Panel: Recent Labs  Lab 03/27/21 0451 03/28/21 0607 03/30/21 0614  NA 138  --   --   K 4.3  --   --   CL 107  --   --   CO2 23  --   --   GLUCOSE 207*  --   --   BUN 19  --   --   CREATININE 1.00 0.93 0.91  CALCIUM 8.0*  --   --    GFR Estimated Creatinine Clearance: 101.4 mL/min (by C-G formula based on SCr of 0.91 mg/dL). Liver Function Tests: No results for input(s): AST, ALT, ALKPHOS, BILITOT, PROT, ALBUMIN in the last 168 hours. No results for input(s): LIPASE, AMYLASE in the last 168 hours. No results for input(s): AMMONIA in the last 168 hours. Coagulation profile No results for input(s): INR, PROTIME in the last 168 hours.  CBC: Recent Labs  Lab 03/27/21 0451  WBC 8.9  NEUTROABS 5.1  HGB 9.3*  HCT 30.3*  MCV 84.9  PLT 413*   Cardiac Enzymes: No results for input(s): CKTOTAL, CKMB, CKMBINDEX, TROPONINI in the last 168 hours. BNP (last 3 results) No results for input(s): PROBNP in the last 8760 hours. CBG: Recent Labs  Lab 03/31/21 1219 03/31/21 1655 03/31/21 2109 04/01/21 0823 04/01/21 1133  GLUCAP 165* 148* 133* 106* 95   D-Dimer: No results for input(s): DDIMER in the last 72 hours. Hgb A1c: No results for input(s): HGBA1C in the last 72 hours. Lipid Profile: No results for input(s): CHOL, HDL, LDLCALC, TRIG, CHOLHDL, LDLDIRECT in the last 72 hours. Thyroid function  studies: No results for input(s): TSH, T4TOTAL, T3FREE, THYROIDAB in the last 72 hours.  Invalid input(s): FREET3 Anemia work up: No results for input(s): VITAMINB12, FOLATE, FERRITIN, TIBC, IRON, RETICCTPCT in the last 72 hours. Sepsis Labs: Recent Labs  Lab 03/27/21 0451  WBC 8.9    Microbiology Recent Results (from the past 240 hour(s))  Blood culture (routine x 2)     Status: None   Collection Time: 03/24/21 10:34 PM   Specimen: BLOOD  Result Value Ref Range Status   Specimen Description BLOOD BLOOD LEFT FOREARM  Final   Special Requests   Final    BOTTLES DRAWN AEROBIC AND ANAEROBIC Blood Culture results may not be optimal due to an excessive volume of blood received in culture bottles   Culture   Final    NO GROWTH 5 DAYS Performed at [facility], [address], [place], [place] [address]    Report Status 03/29/2021 FINAL  Final  Blood culture (routine x 2)     Status: None   Collection Time: 03/24/21 10:34 PM   Specimen: BLOOD  Result Value Ref Range Status   Specimen Description BLOOD LEFT ANTECUBITAL  Final   Special Requests   Final    BOTTLES DRAWN AEROBIC AND ANAEROBIC Blood Culture results may not be optimal due to an excessive volume of blood received in culture bottles   Culture  Final    NO GROWTH 5 DAYS Performed at [facility], [address] [place], [place] [address]    Report Status 03/29/2021 FINAL  Final  SARS CORONAVIRUS 2 (TAT 6-24 HRS) Nasopharyngeal Nasopharyngeal Swab     Status: None   Collection Time: 03/24/21 10:39 PM   Specimen: Nasopharyngeal Swab  Result Value Ref Range Status   SARS Coronavirus 2 NEGATIVE NEGATIVE Final    Comment: (NOTE) SARS-CoV-2 target nucleic acids are NOT DETECTED.  The SARS-CoV-2 RNA is generally detectable in upper and lower respiratory specimens during the acute phase of infection. Negative results do not preclude SARS-CoV-2 infection, do not rule out co-infections with other  pathogens, and should not be used as the sole basis for treatment or other patient management decisions. Negative results must be combined with clinical observations, patient history, and epidemiological information. The expected result is Negative.  Fact Sheet for Patients: SugarRoll.be  Fact Sheet for Healthcare Providers: https://www.woods-mathews.com/  This test is not yet approved or cleared by the [place] FDA and  has been authorized for detection and/or diagnosis of SARS-CoV-2 by FDA under an Emergency Use Authorization (EUA). This EUA will remain  in effect (meaning this test can be used) for the duration of the COVID-19 declaration under Se ction 564(b)(1) of the Act, 21 U.S.C. section 360bbb-3(b)(1), unless the authorization is terminated or revoked sooner.  Performed at [facility], [place] [address] [place], [place] [address]   Aerobic Culture w Gram Stain (superficial specimen)     Status: None   Collection Time: 03/26/21  5:00 PM   Specimen: Wound  Result Value Ref Range Status   Specimen Description   Final    WOUND Performed at [facility], [address] [place], [place] [address]    Special Requests   Final    TOE Performed at [facility], [place], [place] [address]    Gram Stain   Final    FEW WBC PRESENT,BOTH PMN AND MONONUCLEAR RARE GRAM POSITIVE COCCI Performed at [facility], [place] [address], [place], [place] [address]    Culture RARE STAPHYLOCOCCUS AUREUS  Final   Report Status 03/29/2021 FINAL  Final   Organism ID, Bacteria STAPHYLOCOCCUS AUREUS  Final      Susceptibility   Staphylococcus aureus - MIC*    CIPROFLOXACIN <=0.5 SENSITIVE Sensitive     ERYTHROMYCIN <=0.25 SENSITIVE Sensitive     GENTAMICIN <=0.5 SENSITIVE Sensitive     OXACILLIN 0.5 SENSITIVE Sensitive     TETRACYCLINE <=1 SENSITIVE Sensitive     VANCOMYCIN <=0.5 SENSITIVE Sensitive      TRIMETH/SULFA <=10 SENSITIVE Sensitive     CLINDAMYCIN <=0.25 SENSITIVE Sensitive     RIFAMPIN <=0.5 SENSITIVE Sensitive     Inducible Clindamycin NEGATIVE Sensitive     * RARE STAPHYLOCOCCUS AUREUS    Procedures and diagnostic studies:  MR TOES RIGHT W WO CONTRAST  Result Date: 03/31/2021 CLINICAL DATA:  Re-evaluation of osteomyelitis of the great toe. EXAM: MRI OF THE RIGHT TOES WITHOUT AND WITH CONTRAST TECHNIQUE: Multiplanar, multisequence MR imaging of the right forefoot was performed both before and after administration of intravenous contrast. CONTRAST:  84mL GADAVIST GADOBUTROL 1 MMOL/ML IV SOLN COMPARISON:  03/25/2021 FINDINGS: Bones/Joint/Cartilage Severe soft tissue edema of the great toe extending into the more proximal medial forefoot with enhancement most consistent with cellulitis. Complex fluid collection along the dorsal aspect of the great toe measuring 2 x 0.5 x 2.6  cm consistent with an abscess. Large skin blister along the medial aspect of the great toe. Severe bone marrow edema in the first distal phalanx and head of the first proximal phalanx most concerning for osteomyelitis. Mild osteoarthritis of the first MTP joint with subchondral reactive marrow changes. No acute fracture or dislocation. Normal alignment. No joint effusion. Ligaments Collateral ligaments are intact.  Lisfranc ligament is intact. Muscles and Tendons Flexor, peroneal and extensor compartment tendons are intact. Mild edema in the plantar musculature likely neurogenic. Soft tissue No other fluid collection or hematoma.  No soft tissue mass. IMPRESSION: 1. Severe cellulitis of the great toe extending into the more proximal medial forefoot. Complex fluid collection along the dorsal aspect of the great toe measuring 1.8 x 0.4 x 2.6 cm consistent with an abscess. Osteomyelitis of the first distal phalanx and to lesser extent head of the first proximal phalanx. No significant interval change compared with  03/25/2021. Electronically Signed   By: Kathreen Devoid   On: 03/31/2021 08:16               LOS: 8 days   Joshuajames Moehring  [facility]   Pager on [email or web address]. If 7PM-7AM, please contact night-coverage at [email or web address]     04/01/2021, 12:50 PM

## 2021-04-01 NOTE — Op Note (Signed)
Date of operation: 04/01/2021.  Surgeon: [name] D.P.M.  Preoperative diagnosis: Osteomyelitis right hallux.  Postoperative diagnosis: Same.  Procedure: Amputation right great toe at the metatarsophalangeal joint.  Anesthesia: LMA with local.  Hemostasis: Pneumatic tourniquet right ankle 250 mmHg.  Estimated blood loss: Less than 5 cc.  Pathology: Right great toe.  Cultures: Bone culture right hallux.  Complications: None apparent.  Operative indications: This is a 50 year old male with a couple of month history of infection in his right great toe.  Has presented to the hospital with 2 previous admissions but refused amputation at those times.  Return to the hospital about a week ago and finally agreed for amputation of his right great toe after a couple of MRIs.  Operative procedure: Patient was taken to the operating room and placed on the table in the supine position.  Following satisfactory LMA anesthesia the right forefoot was anesthetized with 10 cc of 0.5% bupivacaine plain around the first metatarsal.  Pneumatic tourniquet applied at the level of the right ankle and the foot was prepped and draped in the usual sterile fashion.  The foot was exsanguinated and the tourniquet inflated to 250 mmHg.    Attention was then directed to the right hallux where an elliptical incision was made coursing dorsal to plantar around the medial and lateral base of the proximal phalanx.  Incision was carried sharply down to the level of bone and dissection carried back to the joint where the toe was disarticulated and removed in toto.  Some inflamed tissue was noted around the periphery and this was thoroughly debrided with a Versajet debrider on a setting of 4 and then irrigated.  The wound was then reapproximated using 3-0 nylon vertical mattress and simple erupted sutures.  Xeroform 4 x 4's and conformer applied to the right foot.  Tourniquet was released and then a Kerlix and Ace wrap applied  to the right lower extremity with ABD.  Patient tolerated the procedure and anesthesia well and was transported the PACU with vital signs stable and in good condition.

## 2021-04-01 NOTE — Plan of Care (Signed)
  Problem: Education: Goal: Knowledge of General Education information will improve Description: Including pain rating scale, medication(s)/side effects and non-pharmacologic comfort measures 04/01/2021 0759 by Trula Slade, RN Outcome: Progressing 04/01/2021 0758 by Trula Slade, RN Outcome: Progressing   Problem: Health Behavior/Discharge Planning: Goal: Ability to manage health-related needs will improve 04/01/2021 0759 by Trula Slade, RN Outcome: Progressing 04/01/2021 0758 by Trula Slade, RN Outcome: Progressing   Problem: Clinical Measurements: Goal: Ability to maintain clinical measurements within normal limits will improve 04/01/2021 0759 by Trula Slade, RN Outcome: Progressing 04/01/2021 0758 by Trula Slade, RN Outcome: Progressing Goal: Will remain free from infection 04/01/2021 0759 by Trula Slade, RN Outcome: Progressing 04/01/2021 0758 by Trula Slade, RN Outcome: Progressing Goal: Diagnostic test results will improve 04/01/2021 0759 by Trula Slade, RN Outcome: Progressing 04/01/2021 0758 by Trula Slade, RN Outcome: Progressing Goal: Respiratory complications will improve 04/01/2021 0759 by Trula Slade, RN Outcome: Progressing 04/01/2021 0758 by Trula Slade, RN Outcome: Progressing Goal: Cardiovascular complication will be avoided 04/01/2021 0759 by Trula Slade, RN Outcome: Progressing 04/01/2021 0758 by Trula Slade, RN Outcome: Progressing

## 2021-04-01 NOTE — Transfer of Care (Signed)
Immediate Anesthesia Transfer of Care Note  Patient: Joshua [name]  Procedure(s) Performed: AMPUTATION TOE (Right Toe)  Patient Location: PACU  Anesthesia Type:General  Level of Consciousness: drowsy  Airway & Oxygen Therapy: Patient Spontanous Breathing and Patient connected to face mask oxygen  Post-op Assessment: Report given to RN and Post -op Vital signs reviewed and stable  Post vital signs: Reviewed and stable  Last Vitals:  Vitals Value Taken Time  BP 91/62 04/01/21 1445  Temp 36.2 C 04/01/21 1441  Pulse 74 04/01/21 1448  Resp 21 04/01/21 1448  SpO2 100 % 04/01/21 1448  Vitals shown include unvalidated device data.  Last Pain:  Vitals:   04/01/21 1305  TempSrc:   PainSc: 0-No pain         Complications: No complications documented.

## 2021-04-01 NOTE — Anesthesia Preprocedure Evaluation (Signed)
Anesthesia Evaluation  Patient identified by MRN, date of birth, ID band Patient awake    Reviewed: Allergy & Precautions, H&P , NPO status , Patient's Chart, lab work & pertinent test results, reviewed documented beta blocker date and time   History of Anesthesia Complications Negative for: history of anesthetic complications  Airway Mallampati: II  TM Distance: >3 FB Neck ROM: full    Dental  (+) Dental Advidsory Given, Poor Dentition, Chipped, Missing   Pulmonary neg shortness of breath, neg sleep apnea, neg COPD, neg recent URI, Current Smoker and Patient abstained from smoking.,    Pulmonary exam normal breath sounds clear to auscultation       Cardiovascular Exercise Tolerance: Good hypertension, (-) angina+ CAD, + Past MI, + Cardiac Stents and +CHF  Normal cardiovascular exam(-) dysrhythmias (-) Valvular Problems/Murmurs Rhythm:regular Rate:Normal     Neuro/Psych negative neurological ROS  negative psych ROS   GI/Hepatic negative GI ROS, (+)     substance abuse  cocaine use,   Endo/Other  diabetes  Renal/GU negative Renal ROS  negative genitourinary   Musculoskeletal   Abdominal   Peds  Hematology negative hematology ROS (+)   Anesthesia Other Findings Past Medical History: 03/27/2021: Abrasion of toe with infection, right, subsequent  encounter No date: CHF (congestive heart failure) (HCC) No date: Diabetes mellitus without complication (HCC) No date: Hypertension   Reproductive/Obstetrics negative OB ROS                             Anesthesia Physical Anesthesia Plan  ASA: III  Anesthesia Plan: General   Post-op Pain Management:    Induction: Intravenous  PONV Risk Score and Plan: 1 and Ondansetron, Dexamethasone, Promethazine and Treatment may vary due to age or medical condition  Airway Management Planned: LMA  Additional Equipment:   Intra-op Plan:    Post-operative Plan: Extubation in OR  Informed Consent: I have reviewed the patients History and Physical, chart, labs and discussed the procedure including the risks, benefits and alternatives for the proposed anesthesia with the patient or authorized representative who has indicated his/her understanding and acceptance.     Dental Advisory Given  Plan Discussed with: Anesthesiologist, CRNA and Surgeon  Anesthesia Plan Comments:         Anesthesia Quick Evaluation

## 2021-04-01 NOTE — Plan of Care (Signed)

## 2021-04-01 NOTE — Interval H&P Note (Signed)
History and Physical Interval Note:  04/01/2021 1:28 PM  Joshua [name]  has presented today for surgery, with the diagnosis of gangrene.  The various methods of treatment have been discussed with the patient and family. After consideration of risks, benefits and other options for treatment, the patient has consented to  Procedure(s): AMPUTATION TOE (Right) as a surgical intervention.  The patient's history has been reviewed, patient examined, no change in status, stable for surgery.  I have reviewed the patient's chart and labs.  Questions were answered to the patient's satisfaction.     [name]

## 2021-04-02 ENCOUNTER — Encounter: Payer: Self-pay | Admitting: Podiatry

## 2021-04-02 DIAGNOSIS — D649 Anemia, unspecified: Secondary | ICD-10-CM | POA: Diagnosis not present

## 2021-04-02 DIAGNOSIS — M86171 Other acute osteomyelitis, right ankle and foot: Secondary | ICD-10-CM | POA: Diagnosis not present

## 2021-04-02 DIAGNOSIS — E1169 Type 2 diabetes mellitus with other specified complication: Secondary | ICD-10-CM

## 2021-04-02 LAB — BASIC METABOLIC PANEL
Anion gap: 8 (ref 5–15)
BUN: 15 mg/dL (ref 6–20)
CO2: 27 mmol/L (ref 22–32)
Calcium: 8.6 mg/dL — ABNORMAL LOW (ref 8.9–10.3)
Chloride: 101 mmol/L (ref 98–111)
Creatinine, Ser: 1.04 mg/dL (ref 0.61–1.24)
GFR, Estimated: >60 mL/min (ref >60)
Glucose, Bld: 160 mg/dL — ABNORMAL HIGH (ref 70–99)
Potassium: 4.5 mmol/L (ref 3.5–5.1)
Sodium: 136 mmol/L (ref 135–145)

## 2021-04-02 LAB — CBC WITH DIFFERENTIAL/PLATELET
Abs Immature Granulocytes: 0.1 10*3/uL — ABNORMAL HIGH (ref 0.00–0.07)
Basophils Absolute: 0.1 10*3/uL (ref 0.0–0.1)
Basophils Relative: 1 %
Eosinophils Absolute: 0.3 10*3/uL (ref 0.0–0.5)
Eosinophils Relative: 4 %
HCT: 34.3 % — ABNORMAL LOW (ref 39.0–52.0)
Hemoglobin: 10.8 g/dL — ABNORMAL LOW (ref 13.0–17.0)
Immature Granulocytes: 1 %
Lymphocytes Relative: 32 %
Lymphs Abs: 2.4 10*3/uL (ref 0.7–4.0)
MCH: 26.3 pg (ref 26.0–34.0)
MCHC: 31.5 g/dL (ref 30.0–36.0)
MCV: 83.5 fL (ref 80.0–100.0)
Monocytes Absolute: 0.7 10*3/uL (ref 0.1–1.0)
Monocytes Relative: 10 %
Neutro Abs: 3.8 10*3/uL (ref 1.7–7.7)
Neutrophils Relative %: 52 %
Platelets: 342 10*3/uL (ref 150–400)
RBC: 4.11 MIL/uL — ABNORMAL LOW (ref 4.22–5.81)
RDW: 15.3 % (ref 11.5–15.5)
WBC: 7.4 10*3/uL (ref 4.0–10.5)
nRBC: 0 % (ref 0.0–0.2)

## 2021-04-02 LAB — GLUCOSE, CAPILLARY
Glucose-Capillary: 159 mg/dL — ABNORMAL HIGH (ref 70–99)
Glucose-Capillary: 114 mg/dL — ABNORMAL HIGH (ref 70–99)
Glucose-Capillary: 262 mg/dL — ABNORMAL HIGH (ref 70–99)
Glucose-Capillary: 179 mg/dL — ABNORMAL HIGH (ref 70–99)

## 2021-04-02 NOTE — Plan of Care (Signed)

## 2021-04-02 NOTE — Progress Notes (Signed)
[facility] INFECTIOUS DISEASE PROGRESS NOTE Date of Admission:  03/24/2021     ID: Joshua [name] is a 50 y.o. male with DFI and osteomyeltiis Active Problems:   Chronic combined systolic (congestive) and diastolic (congestive) heart failure (HCC)   Acute osteomyelitis of toe, right (HCC)   Subjective: S/p toe amputation 4/19 - Dr Cleda Mccreedy reports able to close primarily. Tolerate cefazolin. Reports pain in toe  ROS  Eleven systems are reviewed and negative except per hpi  Medications:  Antibiotics Given (last 72 hours)    Date/Time Action Medication Dose Rate   03/30/21 1518 Given   metroNIDAZOLE (FLAGYL) tablet 500 mg 500 mg    03/30/21 1520 New Bag/Given   ceFAZolin (ANCEF) IVPB 2g/100 mL premix 2 g 200 mL/hr   03/30/21 2139 Given   metroNIDAZOLE (FLAGYL) tablet 500 mg 500 mg    03/30/21 2149 New Bag/Given   ceFAZolin (ANCEF) IVPB 2g/100 mL premix 2 g 200 mL/hr   03/31/21 0516 Given   metroNIDAZOLE (FLAGYL) tablet 500 mg 500 mg    03/31/21 0517 New Bag/Given   ceFAZolin (ANCEF) IVPB 2g/100 mL premix 2 g 200 mL/hr   03/31/21 1451 Given   metroNIDAZOLE (FLAGYL) tablet 500 mg 500 mg    03/31/21 1454 New Bag/Given   ceFAZolin (ANCEF) IVPB 2g/100 mL premix 2 g 200 mL/hr   03/31/21 2116 Given   metroNIDAZOLE (FLAGYL) tablet 500 mg 500 mg    03/31/21 2123 New Bag/Given   ceFAZolin (ANCEF) IVPB 2g/100 mL premix 2 g 200 mL/hr   04/01/21 0522 New Bag/Given   ceFAZolin (ANCEF) IVPB 2g/100 mL premix 2 g 200 mL/hr   04/01/21 1400 Given   ceFAZolin (ANCEF) IVPB 2g/100 mL premix 2 g    04/01/21 2100 Given   metroNIDAZOLE (FLAGYL) tablet 500 mg 500 mg    04/01/21 2107 New Bag/Given   ceFAZolin (ANCEF) IVPB 2g/100 mL premix 2 g 200 mL/hr   04/02/21 0615 Given   metroNIDAZOLE (FLAGYL) tablet 500 mg 500 mg    04/02/21 0615 New Bag/Given   ceFAZolin (ANCEF) IVPB 2g/100 mL premix 2 g 200 mL/hr     . ascorbic acid  250 mg Oral BID  . carvedilol  3.125 mg Oral BID WC  .  dapagliflozin propanediol  5 mg Oral Daily  . enoxaparin (LOVENOX) injection  40 mg Subcutaneous Q24H  . fluticasone  2 spray Each Nare Daily  . gabapentin  100 mg Oral QHS  . insulin aspart  0-15 Units Subcutaneous TID AC & HS  . insulin glargine  42 Units Subcutaneous Daily  . losartan  25 mg Oral Daily  . metroNIDAZOLE  500 mg Oral Q8H  . multivitamin with minerals  1 tablet Oral Daily  . spironolactone  25 mg Oral Daily    Objective: Vital signs in last 24 hours: Temp:  [97 F (36.1 C)-98.5 F (36.9 C)] 98.4 F (36.9 C) (04/20 0743) Pulse Rate:  [61-81] 75 (04/20 0743) Resp:  [11-24] 16 (04/20 0743) BP: (85-135)/(58-89) 115/85 (04/20 0743) SpO2:  [98 %-100 %] 100 % (04/20 0743) Constitutional: He is oriented to person, place, and time. Disheveled HENT: anicteric  Mouth/Throat: Oropharynx is clear and moist. No oropharyngeal exudate.  Cardiovascular: Normal rate, regular rhythm and normal heart sounds. Exam reveals no gallop and no friction rub.  Pulmonary/Chest: Effort normal and breath sounds normal. No respiratory distress. He has no wheezes.  Abdominal: Soft. Bowel sounds are normal. He exhibits no distension. There is no tenderness.  Lymphadenopathy: He has no cervical adenopathy.  Neurological: He is alert and oriented to person, place, and time.  Skin: foot wrapped post op Psychiatric: He has a normal mood and affect. His behavior is abnormal.   Lab Results Recent Labs    04/02/21 0605  WBC 7.4  HGB 10.8*  HCT 34.3*  NA 136  K 4.5  CL 101  CO2 27  BUN 15  CREATININE 1.04    Microbiology: Results for orders placed or performed during the hospital encounter of 03/24/21  Blood culture (routine x 2)     Status: None   Collection Time: 03/24/21 10:34 PM   Specimen: BLOOD  Result Value Ref Range Status   Specimen Description BLOOD BLOOD LEFT FOREARM  Final   Special Requests   Final    BOTTLES DRAWN AEROBIC AND ANAEROBIC Blood Culture results may not be  optimal due to an excessive volume of blood received in culture bottles   Culture   Final    NO GROWTH 5 DAYS Performed at [facility], [address], [place], [place] [address]    Report Status 03/29/2021 FINAL  Final  Blood culture (routine x 2)     Status: None   Collection Time: 03/24/21 10:34 PM   Specimen: BLOOD  Result Value Ref Range Status   Specimen Description BLOOD LEFT ANTECUBITAL  Final   Special Requests   Final    BOTTLES DRAWN AEROBIC AND ANAEROBIC Blood Culture results may not be optimal due to an excessive volume of blood received in culture bottles   Culture   Final    NO GROWTH 5 DAYS Performed at [facility], [address] [place], [place] [address]    Report Status 03/29/2021 FINAL  Final  SARS CORONAVIRUS 2 (TAT 6-24 HRS) Nasopharyngeal Nasopharyngeal Swab     Status: None   Collection Time: 03/24/21 10:39 PM   Specimen: Nasopharyngeal Swab  Result Value Ref Range Status   SARS Coronavirus 2 NEGATIVE NEGATIVE Final    Comment: (NOTE) SARS-CoV-2 target nucleic acids are NOT DETECTED.  The SARS-CoV-2 RNA is generally detectable in upper and lower respiratory specimens during the acute phase of infection. Negative results do not preclude SARS-CoV-2 infection, do not rule out co-infections with other pathogens, and should not be used as the sole basis for treatment or other patient management decisions. Negative results must be combined with clinical observations, patient history, and epidemiological information. The expected result is Negative.  Fact Sheet for Patients: SugarRoll.be  Fact Sheet for Healthcare Providers: https://www.woods-mathews.com/  This test is not yet approved or cleared by the [place] FDA and  has been authorized for detection and/or diagnosis of SARS-CoV-2 by FDA under an Emergency Use Authorization (EUA). This EUA will remain  in effect (meaning this test can  be used) for the duration of the COVID-19 declaration under Se ction 564(b)(1) of the Act, 21 U.S.C. section 360bbb-3(b)(1), unless the authorization is terminated or revoked sooner.  Performed at [facility], [place] [address] [place], [place] [address]   Aerobic Culture w Gram Stain (superficial specimen)     Status: None   Collection Time: 03/26/21  5:00 PM   Specimen: Wound  Result Value Ref Range Status   Specimen Description   Final    WOUND Performed at [facility], [address], [place], [place] [address]    Special Requests   Final    TOE Performed at [facility], [address] [place], [place] [address]    Gram Stain   Final    FEW WBC PRESENT,BOTH PMN AND MONONUCLEAR RARE GRAM POSITIVE COCCI Performed at [facility], [place] [address] [place], [place] [address]    Culture RARE STAPHYLOCOCCUS AUREUS  Final   Report Status 03/29/2021 FINAL  Final   Organism ID, Bacteria STAPHYLOCOCCUS AUREUS  Final      Susceptibility   Staphylococcus aureus - MIC*    CIPROFLOXACIN <=0.5 SENSITIVE Sensitive     ERYTHROMYCIN <=0.25 SENSITIVE Sensitive     GENTAMICIN <=0.5 SENSITIVE Sensitive     OXACILLIN 0.5 SENSITIVE Sensitive     TETRACYCLINE <=1 SENSITIVE Sensitive     VANCOMYCIN <=0.5 SENSITIVE Sensitive     TRIMETH/SULFA <=10 SENSITIVE Sensitive     CLINDAMYCIN <=0.25 SENSITIVE Sensitive     RIFAMPIN <=0.5 SENSITIVE Sensitive     Inducible Clindamycin NEGATIVE Sensitive     * RARE STAPHYLOCOCCUS AUREUS  Aerobic/Anaerobic Culture w Gram Stain (surgical/deep wound)     Status: None (Preliminary result)   Collection Time: 04/01/21  2:12 PM   Specimen: Toe, Right; Amputation  Result Value Ref Range Status   Specimen Description   Final    WOUND Performed at [facility], [address] [place], [place] [address]    Special Requests   Final    RIGHT TOE Performed at [facility], [address] [place], [place]  [address]    Gram Stain   Final    NO WBC SEEN NO ORGANISMS SEEN Performed at [facility], [place] [address], [place], [place] [address]    Culture PENDING  Incomplete   Report Status PENDING  Incomplete    Studies/Results: No results found.  Assessment/Plan: Joshua [name] is a 50 y.o. male with multiple medical issues including poorly controlled DM (A1c > 11), homelessness, CAD, CHF, polysubstance abuse (Cocaine and THC) who had developed a wound on distal R toe after pulling on a skin scab (photo from 3/4) During early admission in March seen by podiatry and rec distal toe amputation but pt refused. He was rec to take augmentin but tells me he did not take. Readmitted late March with CP (Cocaine +) and PNA and again podiatry rec amputation but pt refused. He did start augmentin as otpt.  Now readmitted with fever, leukocytosis, MRI showing osteo of distal and prox phalnax as well as abscess over dorsal toe.  Only prior cx 3/4 mixed but also had rare  Enterobacter cloacae (No Staph aureus or Pseudomonas noted).  Started on vanco and cefepime. Podiatry rec toe amputation at this point but pt refuses again. I had a very long discussion with patient regarding the infection.  He is under the impression that the augmentin he was on caused the wound to worsen (although he tells me he did not take after first dc and took for a few days after second admit).  He tells me he has always had listed in his chart that he was allergic to penicillin but cannot tell me what the reaction was. Of note the prior notes suggest he did have gradual intro to amox at initial hospitalization and had no rash, or other signs of allergic reaction. Also has no eosinophilia.  He also tells me everyone he knows (100%) who had toe amputation went onto have total leg amputation. I spent significant time explaining to him that despite what has happened in past or to his friends, despite his perception that  augmentin made  the toe worse, we are faced with a potentially limb and life threatening infection. He needs IV abx and surgery. I reiterated this to him and advised risk of sepsis without surgical I and D esp with the abscess.  4/13- some drainage now from tip of toe. Thinks the pain up his leg is improving 4/15 no fevers, wbc had come down from 17-9 4/18 - no fevers,. MRI done. Refusing amputation. Wound  cx MSSA 4/20 -s/p surgery 4/19 Recommendations Cont cefazolin Followup on path and cultures from surgery. Anticipate dc on orals in 2-3 days if doing well and bone margins neg. Thank you very much for the consult. Will follow with you.  Joshua [name]   04/02/2021, 2:47 PM

## 2021-04-02 NOTE — Progress Notes (Signed)
VAST consulted to obtain IV access. Upon arrival at pt's bedside, patient requests VAST RN return after he finishes breakfast. Upon return to bedside, unit RN was in process of obtaining IV access. No further needs at this time.

## 2021-04-02 NOTE — Progress Notes (Signed)
1 Day Post-Op   Subjective/Chief Complaint: Patient seen.  Relates some significant pain with the amputation site on his right foot.   Objective: Vital signs in last 24 hours: Temp:  [97 F (36.1 C)-98.5 F (36.9 C)] 98.4 F (36.9 C) (04/20 0743) Pulse Rate:  [61-81] 75 (04/20 0743) Resp:  [11-24] 16 (04/20 0743) BP: (82-135)/(58-89) 115/85 (04/20 0743) SpO2:  [98 %-100 %] 100 % (04/20 0743) Last BM Date: 03/31/21  Intake/Output from previous day: 04/19 0701 - 04/20 0700 In: 500 [I.V.:400; IV Piggyback:100] Out: 805 [Urine:800; Blood:5] Intake/Output this shift: Total I/O In: 480 [P.O.:480] Out: -   Dressing on the right foot is dry and intact.  No evidence of strikethrough.  Lab Results:  Recent Labs    04/02/21 0605  WBC 7.4  HGB 10.8*  HCT 34.3*  PLT 342   BMET Recent Labs    04/02/21 0605  NA 136  K 4.5  CL 101  CO2 27  GLUCOSE 160*  BUN 15  CREATININE 1.04  CALCIUM 8.6*   PT/INR No results for input(s): LABPROT, INR in the last 72 hours. ABG No results for input(s): PHART, HCO3 in the last 72 hours.  Invalid input(s): PCO2, PO2  Studies/Results: No results found.  Anti-infectives: Anti-infectives (From admission, onward)   Start     Dose/Rate Route Frequency Ordered Stop   04/01/21 1333  ceFAZolin (ANCEF) 2-4 GM/100ML-% IVPB       Note to Pharmacy: Trudie Reed   : cabinet override      04/01/21 1333 04/01/21 1401   03/30/21 1400  ceFAZolin (ANCEF) IVPB 2g/100 mL premix        2 g 200 mL/hr over 30 Minutes Intravenous Every 8 hours 03/30/21 1211     03/25/21 2200  metroNIDAZOLE (FLAGYL) tablet 500 mg        500 mg Oral Every 8 hours 03/25/21 1839     03/25/21 1200  vancomycin (VANCOREADY) IVPB 1250 mg/250 mL  Status:  Discontinued        1,250 mg 166.7 mL/hr over 90 Minutes Intravenous Every 12 hours 03/25/21 0057 03/30/21 1211   03/25/21 0600  ceFEPIme (MAXIPIME) 2 g in sodium chloride 0.9 % 100 mL IVPB  Status:  Discontinued        2  g 200 mL/hr over 30 Minutes Intravenous Every 8 hours 03/25/21 0054 03/30/21 1211   03/24/21 2345  vancomycin (VANCOCIN) IVPB 1000 mg/200 mL premix  Status:  Discontinued        1,000 mg 200 mL/hr over 60 Minutes Intravenous  Once 03/24/21 2331 03/24/21 2349   03/24/21 2345  ceFEPIme (MAXIPIME) 2 g in sodium chloride 0.9 % 100 mL IVPB  Status:  Discontinued        2 g 200 mL/hr over 30 Minutes Intravenous  Once 03/24/21 2331 03/25/21 0049   03/24/21 2300  vancomycin (VANCOREADY) IVPB 1750 mg/350 mL        1,750 mg 175 mL/hr over 120 Minutes Intravenous  Once 03/24/21 2256 03/25/21 0208   03/24/21 2245  vancomycin (VANCOCIN) injection 1,750 mg  Status:  Discontinued        1,750 mg Intravenous  Once 03/24/21 2221 03/24/21 2255   03/24/21 2230  ceFEPIme (MAXIPIME) 2 g in sodium chloride 0.9 % 100 mL IVPB        2 g 200 mL/hr over 30 Minutes Intravenous  Once 03/24/21 2221 03/24/21 2359      Assessment/Plan: s/p Procedure(s) with comments: AMPUTATION TOE (  Right) - RIGHT GREAT TOE Assessment: Status post amputation right hallux.   Plan: Dressing left intact today.  We will plan for dressing change tomorrow for evaluation of the amputation site.  LOS: 9 days    [name] 04/02/2021

## 2021-04-02 NOTE — Progress Notes (Signed)
PROGRESS NOTE    Joshua [name]  DXI:[id number] DOB: [birth year] DOA: 03/24/2021 PCP: Patient, No Pcp Per (Inactive)  Assessment & Plan:   Active Problems:   Chronic combined systolic (congestive) and diastolic (congestive) heart failure (HCC)   Acute osteomyelitis of toe, right (HCC)   Sepsis: secondary to osteomyelitis of right big toe. Continue on IV cefazolin.  Acute on chronic osteomyelitis: continue on IV cefazolin as per ID. Wound cx growing MSSA. S/p amputation of right great toe at the metatarsophalangeal joint as per podiatry   HTN: continue on home dose of carvedilol, losartan   DM2: likely poorly controlled. Continue on lantus, SSI w/ accuchecks. Pt declines to have a carb modified diet and requests a regular diet   Chronic combined CHF: continue on carvedilol, losartan. Monitor I/Os   Normocytic anemia: no need for a transfusion currently. Will continue to monitor    DVT prophylaxis: lovenox  Code Status: full  Family Communication:  Disposition Plan: unclear as pt is homeless, CM is aware   Level of care: Med-Surg   Status is: Inpatient  Remains inpatient appropriate because:Ongoing diagnostic testing needed not appropriate for outpatient work up, Unsafe d/c plan, IV treatments appropriate due to intensity of illness or inability to take PO and Inpatient level of care appropriate due to severity of illness   Dispo: The patient is from: Home              Anticipated d/c is to: Home              Patient currently is not medically stable to d/c.   Difficult to place patient Yes    Consultants:   Podiatry  ID   Procedures:   Antimicrobials: cefazolin    Subjective: Pt c/o fatigue   Objective: Vitals:   04/01/21 1713 04/01/21 1730 04/01/21 1937 04/02/21 0510  BP: 105/75 96/64 108/75 135/89  Pulse: 69 68 81 74  Resp: 14 18 17 16   Temp:  97.7 F (36.5 C) 97.8 F (36.6 C) 98.5 F (36.9 C)  TempSrc:  Oral    SpO2: 100% 100% 100% 100%   Weight:      Height:        Intake/Output Summary (Last 24 hours) at 04/02/2021 0739 Last data filed at 04/02/2021 0100 Gross per 24 hour  Intake 500 ml  Output 805 ml  Net -305 ml   Filed Weights   03/24/21 2140  Weight: 82.5 kg    Examination:  General exam: Appears calm and comfortable. Missing multiple teeth   Respiratory system: Clear to auscultation. Respiratory effort normal. Cardiovascular system: S1 & S2+. No rubs, gallops or clicks.  Gastrointestinal system: Abdomen is nondistended, soft and nontender. Normal bowel sounds heard. Central nervous system: Alert and oriented. Moves all extremities  Psychiatry: Judgement and insight appear normal. Flat mood and affect.     Data Reviewed: I have personally reviewed following labs and imaging studies  CBC: Recent Labs  Lab 03/27/21 0451 04/02/21 0605  WBC 8.9 7.4  NEUTROABS 5.1 3.8  HGB 9.3* 10.8*  HCT 30.3* 34.3*  MCV 84.9 83.5  PLT 413* 767   Basic Metabolic Panel: Recent Labs  Lab 03/27/21 0451 03/28/21 0607 03/30/21 0614 04/02/21 0605  NA 138  --   --  136  K 4.3  --   --  4.5  CL 107  --   --  101  CO2 23  --   --  27  GLUCOSE 207*  --   --  160*  BUN 19  --   --  15  CREATININE 1.00 0.93 0.91 1.04  CALCIUM 8.0*  --   --  8.6*   GFR: Estimated Creatinine Clearance: 88.7 mL/min (by C-G formula based on SCr of 1.04 mg/dL). Liver Function Tests: No results for input(s): AST, ALT, ALKPHOS, BILITOT, PROT, ALBUMIN in the last 168 hours. No results for input(s): LIPASE, AMYLASE in the last 168 hours. No results for input(s): AMMONIA in the last 168 hours. Coagulation Profile: No results for input(s): INR, PROTIME in the last 168 hours. Cardiac Enzymes: No results for input(s): CKTOTAL, CKMB, CKMBINDEX, TROPONINI in the last 168 hours. BNP (last 3 results) No results for input(s): PROBNP in the last 8760 hours. HbA1C: No results for input(s): HGBA1C in the last 72 hours. CBG: Recent Labs  Lab  04/01/21 1133 04/01/21 1306 04/01/21 1447 04/01/21 1734 04/01/21 2045  GLUCAP 95 94 93 135* 199*   Lipid Profile: No results for input(s): CHOL, HDL, LDLCALC, TRIG, CHOLHDL, LDLDIRECT in the last 72 hours. Thyroid Function Tests: No results for input(s): TSH, T4TOTAL, FREET4, T3FREE, THYROIDAB in the last 72 hours. Anemia Panel: No results for input(s): VITAMINB12, FOLATE, FERRITIN, TIBC, IRON, RETICCTPCT in the last 72 hours. Sepsis Labs: No results for input(s): PROCALCITON, LATICACIDVEN in the last 168 hours.  Recent Results (from the past 240 hour(s))  Blood culture (routine x 2)     Status: None   Collection Time: 03/24/21 10:34 PM   Specimen: BLOOD  Result Value Ref Range Status   Specimen Description BLOOD BLOOD LEFT FOREARM  Final   Special Requests   Final    BOTTLES DRAWN AEROBIC AND ANAEROBIC Blood Culture results may not be optimal due to an excessive volume of blood received in culture bottles   Culture   Final    NO GROWTH 5 DAYS Performed at [facility], [address], [place], [place] [address]    Report Status 03/29/2021 FINAL  Final  Blood culture (routine x 2)     Status: None   Collection Time: 03/24/21 10:34 PM   Specimen: BLOOD  Result Value Ref Range Status   Specimen Description BLOOD LEFT ANTECUBITAL  Final   Special Requests   Final    BOTTLES DRAWN AEROBIC AND ANAEROBIC Blood Culture results may not be optimal due to an excessive volume of blood received in culture bottles   Culture   Final    NO GROWTH 5 DAYS Performed at [facility], [address], [place], [place] [address]    Report Status 03/29/2021 FINAL  Final  SARS CORONAVIRUS 2 (TAT 6-24 HRS) Nasopharyngeal Nasopharyngeal Swab     Status: None   Collection Time: 03/24/21 10:39 PM   Specimen: Nasopharyngeal Swab  Result Value Ref Range Status   SARS Coronavirus 2 NEGATIVE NEGATIVE Final    Comment: (NOTE) SARS-CoV-2 target nucleic acids are NOT DETECTED.  The  SARS-CoV-2 RNA is generally detectable in upper and lower respiratory specimens during the acute phase of infection. Negative results do not preclude SARS-CoV-2 infection, do not rule out co-infections with other pathogens, and should not be used as the sole basis for treatment or other patient management decisions. Negative results must be combined with clinical observations, patient history, and epidemiological information. The expected result is Negative.  Fact Sheet for Patients: SugarRoll.be  Fact Sheet for Healthcare Providers: https://www.woods-mathews.com/  This test is not yet approved or cleared by the [place] FDA and  has been authorized for  detection and/or diagnosis of SARS-CoV-2 by FDA under an Emergency Use Authorization (EUA). This EUA will remain  in effect (meaning this test can be used) for the duration of the COVID-19 declaration under Se ction 564(b)(1) of the Act, 21 U.S.C. section 360bbb-3(b)(1), unless the authorization is terminated or revoked sooner.  Performed at [facility], [place] [address] [place], [place] [address]   Aerobic Culture w Gram Stain (superficial specimen)     Status: None   Collection Time: 03/26/21  5:00 PM   Specimen: Wound  Result Value Ref Range Status   Specimen Description   Final    WOUND Performed at [facility], [address], [place], [place] [address]    Special Requests   Final    TOE Performed at [facility], [place], [place] [address]    Gram Stain   Final    FEW WBC PRESENT,BOTH PMN AND MONONUCLEAR RARE GRAM POSITIVE COCCI Performed at [facility], [place] [address] [place], [place] [address]    Culture RARE STAPHYLOCOCCUS AUREUS  Final   Report Status 03/29/2021 FINAL  Final   Organism ID, Bacteria STAPHYLOCOCCUS AUREUS  Final      Susceptibility   Staphylococcus aureus - MIC*    CIPROFLOXACIN <=0.5 SENSITIVE  Sensitive     ERYTHROMYCIN <=0.25 SENSITIVE Sensitive     GENTAMICIN <=0.5 SENSITIVE Sensitive     OXACILLIN 0.5 SENSITIVE Sensitive     TETRACYCLINE <=1 SENSITIVE Sensitive     VANCOMYCIN <=0.5 SENSITIVE Sensitive     TRIMETH/SULFA <=10 SENSITIVE Sensitive     CLINDAMYCIN <=0.25 SENSITIVE Sensitive     RIFAMPIN <=0.5 SENSITIVE Sensitive     Inducible Clindamycin NEGATIVE Sensitive     * RARE STAPHYLOCOCCUS AUREUS         Radiology Studies: No results found.      Scheduled Meds: . ascorbic acid  250 mg Oral BID  . carvedilol  3.125 mg Oral BID WC  . dapagliflozin propanediol  5 mg Oral Daily  . enoxaparin (LOVENOX) injection  40 mg Subcutaneous Q24H  . fluticasone  2 spray Each Nare Daily  . gabapentin  100 mg Oral QHS  . insulin aspart  0-15 Units Subcutaneous TID AC & HS  . insulin glargine  42 Units Subcutaneous Daily  . losartan  25 mg Oral Daily  . metroNIDAZOLE  500 mg Oral Q8H  . multivitamin with minerals  1 tablet Oral Daily  . spironolactone  25 mg Oral Daily   Continuous Infusions: .  ceFAZolin (ANCEF) IV 2 g (04/02/21 0615)     LOS: 9 days    Time spent: 35 mins    Wyvonnia Dusky, MD [facility] Pager [phone]  If 7PM-7AM, please contact night-coverage 04/02/2021, 7:39 AM

## 2021-04-03 DIAGNOSIS — M86171 Other acute osteomyelitis, right ankle and foot: Secondary | ICD-10-CM | POA: Diagnosis not present

## 2021-04-03 DIAGNOSIS — I10 Essential (primary) hypertension: Secondary | ICD-10-CM | POA: Diagnosis not present

## 2021-04-03 DIAGNOSIS — E1169 Type 2 diabetes mellitus with other specified complication: Secondary | ICD-10-CM | POA: Diagnosis not present

## 2021-04-03 LAB — BASIC METABOLIC PANEL
Anion gap: 6 (ref 5–15)
BUN: 18 mg/dL (ref 6–20)
CO2: 27 mmol/L (ref 22–32)
Calcium: 8.6 mg/dL — ABNORMAL LOW (ref 8.9–10.3)
Chloride: 103 mmol/L (ref 98–111)
Creatinine, Ser: 0.98 mg/dL (ref 0.61–1.24)
GFR, Estimated: >60 mL/min (ref >60)
Glucose, Bld: 211 mg/dL — ABNORMAL HIGH (ref 70–99)
Potassium: 4.5 mmol/L (ref 3.5–5.1)
Sodium: 136 mmol/L (ref 135–145)

## 2021-04-03 LAB — GLUCOSE, CAPILLARY
Glucose-Capillary: 150 mg/dL — ABNORMAL HIGH (ref 70–99)
Glucose-Capillary: 125 mg/dL — ABNORMAL HIGH (ref 70–99)
Glucose-Capillary: 252 mg/dL — ABNORMAL HIGH (ref 70–99)
Glucose-Capillary: 141 mg/dL — ABNORMAL HIGH (ref 70–99)
Glucose-Capillary: 175 mg/dL — ABNORMAL HIGH (ref 70–99)

## 2021-04-03 LAB — CBC
HCT: 31.7 % — ABNORMAL LOW (ref 39.0–52.0)
Hemoglobin: 9.9 g/dL — ABNORMAL LOW (ref 13.0–17.0)
MCH: 26.1 pg (ref 26.0–34.0)
MCHC: 31.2 g/dL (ref 30.0–36.0)
MCV: 83.4 fL (ref 80.0–100.0)
Platelets: 329 10*3/uL (ref 150–400)
RBC: 3.8 MIL/uL — ABNORMAL LOW (ref 4.22–5.81)
RDW: 15.3 % (ref 11.5–15.5)
WBC: 7.9 10*3/uL (ref 4.0–10.5)
nRBC: 0 % (ref 0.0–0.2)

## 2021-04-03 MED ORDER — SODIUM CHLORIDE 0.9 % IV SOLN
INTRAVENOUS | Status: DC | PRN
  Administered 2021-04-03 – 2021-04-04 (×2): 250 mL via INTRAVENOUS

## 2021-04-03 NOTE — Anesthesia Postprocedure Evaluation (Signed)
Anesthesia Post Note  Patient: Joshua [name], Joshua [name]  Procedure(s) Performed: AMPUTATION TOE (Right Toe)  Patient location during evaluation: PACU Anesthesia Type: General Level of consciousness: awake and alert Pain management: pain level controlled Vital Signs Assessment: post-procedure vital signs reviewed and stable Respiratory status: spontaneous breathing, nonlabored ventilation and respiratory function stable Cardiovascular status: blood pressure returned to baseline and stable Postop Assessment: no apparent nausea or vomiting Anesthetic complications: no   No complications documented.   Last Vitals:  Vitals:   04/03/21 1155 04/03/21 1659  BP: 109/77 105/69  Pulse: 71 84  Resp: 16 16  Temp: 36.6 C 36.9 C  SpO2: 100% 96%    Last Pain:  Vitals:   04/03/21 0817  TempSrc:   PainSc: [place]

## 2021-04-03 NOTE — Progress Notes (Signed)
ID brief note Foot stable per podiatry Cont cefazolin.  Likely can dc tomorrow on oral doxycycline 100 mg bid for 2 weeks to cover for any remaining soft tissue infection. Doxy is orally bioavailable and should be easy for him to take

## 2021-04-03 NOTE — Progress Notes (Signed)
2 Days Post-Op   Subjective/Chief Complaint: Patient seen.  Still having some pain in the foot but improving some.   Objective: Vital signs in last 24 hours: Temp:  [97.9 F (36.6 C)-98.7 F (37.1 C)] 97.9 F (36.6 C) (04/21 1155) Pulse Rate:  [67-79] 71 (04/21 1155) Resp:  [16-18] 16 (04/21 1155) BP: (103-116)/(67-85) 109/77 (04/21 1155) SpO2:  [97 %-100 %] 100 % (04/21 1155) Last BM Date: 04/01/21  Intake/Output from previous day: 04/20 0701 - 04/21 0700 In: 720 [P.O.:720] Out: 600 [Urine:600] Intake/Output this shift: Total I/O In: 600 [P.O.:600] Out: -   Bandage on the right foot is dry and intact.  Upon removal the incision is well coapted with no significant bleeding or any signs of drainage or infection.    Lab Results:  Recent Labs    04/02/21 0605 04/03/21 0509  WBC 7.4 7.9  HGB 10.8* 9.9*  HCT 34.3* 31.7*  PLT 342 329   BMET Recent Labs    04/02/21 0605 04/03/21 0509  NA 136 136  K 4.5 4.5  CL 101 103  CO2 27 27  GLUCOSE 160* 211*  BUN 15 18  CREATININE 1.04 0.98  CALCIUM 8.6* 8.6*   PT/INR No results for input(s): LABPROT, INR in the last 72 hours. ABG No results for input(s): PHART, HCO3 in the last 72 hours.  Invalid input(s): PCO2, PO2  Studies/Results: No results found.  Anti-infectives: Anti-infectives (From admission, onward)   Start     Dose/Rate Route Frequency Ordered Stop   04/01/21 1333  ceFAZolin (ANCEF) 2-4 GM/100ML-% IVPB       Note to Pharmacy: Trudie Reed   : cabinet override      04/01/21 1333 04/01/21 1401   03/30/21 1400  ceFAZolin (ANCEF) IVPB 2g/100 mL premix        2 g 200 mL/hr over 30 Minutes Intravenous Every 8 hours 03/30/21 1211     03/25/21 2200  metroNIDAZOLE (FLAGYL) tablet 500 mg        500 mg Oral Every 8 hours 03/25/21 1839     03/25/21 1200  vancomycin (VANCOREADY) IVPB 1250 mg/250 mL  Status:  Discontinued        1,250 mg 166.7 mL/hr over 90 Minutes Intravenous Every 12 hours 03/25/21 0057  03/30/21 1211   03/25/21 0600  ceFEPIme (MAXIPIME) 2 g in sodium chloride 0.9 % 100 mL IVPB  Status:  Discontinued        2 g 200 mL/hr over 30 Minutes Intravenous Every 8 hours 03/25/21 0054 03/30/21 1211   03/24/21 2345  vancomycin (VANCOCIN) IVPB 1000 mg/200 mL premix  Status:  Discontinued        1,000 mg 200 mL/hr over 60 Minutes Intravenous  Once 03/24/21 2331 03/24/21 2349   03/24/21 2345  ceFEPIme (MAXIPIME) 2 g in sodium chloride 0.9 % 100 mL IVPB  Status:  Discontinued        2 g 200 mL/hr over 30 Minutes Intravenous  Once 03/24/21 2331 03/25/21 0049   03/24/21 2300  vancomycin (VANCOREADY) IVPB 1750 mg/350 mL        1,750 mg 175 mL/hr over 120 Minutes Intravenous  Once 03/24/21 2256 03/25/21 0208   03/24/21 2245  vancomycin (VANCOCIN) injection 1,750 mg  Status:  Discontinued        1,750 mg Intravenous  Once 03/24/21 2221 03/24/21 2255   03/24/21 2230  ceFEPIme (MAXIPIME) 2 g in sodium chloride 0.9 % 100 mL IVPB  2 g 200 mL/hr over 30 Minutes Intravenous  Once 03/24/21 2221 03/24/21 2359      Assessment/Plan: s/p Procedure(s) with comments: AMPUTATION TOE (Right) - RIGHT GREAT TOE Assessment: Stable status post amputation right hallux.   Plan: Betadine and a sterile bandage reapplied to the right foot followed by a bulky bandage.  Discussed with the patient needs to keep this clean and dry and he does not need to remove or change his bandage.  Discussed with the patient that I will change his bandage next week outpatient.  Patient is stable for discharge from a podiatry standpoint and would recommend coverage with oral antibiotics for a week on discharge as I believe the amputation was curative and he should not need any IV consideration.  Podiatry will sign off at this point and follow outpatient unless the patient is unable to be discharged in which case we will see him next week  LOS: 10 days    Joshua [name] 04/03/2021

## 2021-04-03 NOTE — Progress Notes (Signed)
PROGRESS NOTE    Joshua [name]  WHQ:[id number] DOB: 09/27/1971 DOA: 03/24/2021 PCP: Patient, No Pcp Per (Inactive)  Assessment & Plan:   Active Problems:   Chronic combined systolic (congestive) and diastolic (congestive) heart failure (HCC)   Acute osteomyelitis of toe, right (HCC)   Sepsis: secondary to osteomyelitis of right big toe. Continue on IV cefazolin   Acute on chronic osteomyelitis: continue on IV cefazolin as per ID. Wound cx growing MSSA. 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 Waiting on path results to confirm neg margins   HTN: continue on home dose of losartan, coreg   DM2: likely poorly controlled. Continue on lantus, SSI w/ accuchecks. Pt declines to have a carb modified diet and requests a regular diet   Chronic combined CHF: continue on losartan, carvedilol. Monitor I/Os  Normocytic anemia: H&H are labile. No need for transfusion currently    DVT prophylaxis: lovenox  Code Status: full  Family Communication:  Disposition Plan: unclear as pt is homeless, CM is aware   Level of care: Med-Surg   Status is: Inpatient  Remains inpatient appropriate because:Ongoing diagnostic testing needed not appropriate for outpatient work up, Unsafe d/c plan, IV treatments appropriate due to intensity of illness or inability to take PO and Inpatient level of care appropriate due to severity of illness   Dispo: The patient is from: Home              Anticipated d/c is to: Home              Patient currently is not medically stable to d/c.   Difficult to place patient Yes    Consultants:   Podiatry  ID   Procedures:   Antimicrobials: cefazolin    Subjective: Pt c/o right foot pain   Objective: Vitals:   04/02/21 0743 04/02/21 1611 04/02/21 2057 04/03/21 0443  BP: 115/85 111/74 103/72 116/85  Pulse: 75 79 76 78  Resp: 16 16 17 16   Temp: 98.4 F (36.9 C) 98.6 F (37 C) 98.5 F (36.9 C) 98.7 F  (37.1 C)  TempSrc: Oral  Oral   SpO2: 100% 100% 97% 100%  Weight:      Height:        Intake/Output Summary (Last 24 hours) at 04/03/2021 0723 Last data filed at 04/03/2021 0443 Gross per 24 hour  Intake 720 ml  Output 600 ml  Net 120 ml   Filed Weights   03/24/21 2140  Weight: 82.5 kg    Examination:  General exam: Appears comfortable. Missing multiple teeth  Respiratory system: clear breath sounds b/l. No wheezes, rales  Cardiovascular system: S1/S2+. No rubs or clicks  Gastrointestinal system: Abd is soft, NT, ND & normal bowel sounds  Central nervous system: Alert and oriented. Moves all extremities  Psychiatry: Judgement and insight appear normal. Flat mood and affect.     Data Reviewed: I have personally reviewed following labs and imaging studies  CBC: Recent Labs  Lab 04/02/21 0605 04/03/21 0509  WBC 7.4 7.9  NEUTROABS 3.8  --   HGB 10.8* 9.9*  HCT 34.3* 31.7*  MCV 83.5 83.4  PLT 342 659   Basic Metabolic Panel: Recent Labs  Lab 03/28/21 0607 03/30/21 0614 04/02/21 0605 04/03/21 0509  NA  --   --  136 136  K  --   --  4.5 4.5  CL  --   --  101 103  CO2  --   --  27 27  GLUCOSE  --   --  160* 211*  BUN  --   --  15 18  CREATININE 0.93 0.91 1.04 0.98  CALCIUM  --   --  8.6* 8.6*   GFR: Estimated Creatinine Clearance: 94.1 mL/min (by C-G formula based on SCr of 0.98 mg/dL). Liver Function Tests: No results for input(s): AST, ALT, ALKPHOS, BILITOT, PROT, ALBUMIN in the last 168 hours. No results for input(s): LIPASE, AMYLASE in the last 168 hours. No results for input(s): AMMONIA in the last 168 hours. Coagulation Profile: No results for input(s): INR, PROTIME in the last 168 hours. Cardiac Enzymes: No results for input(s): CKTOTAL, CKMB, CKMBINDEX, TROPONINI in the last 168 hours. BNP (last 3 results) No results for input(s): PROBNP in the last 8760 hours. HbA1C: No results for input(s): HGBA1C in the last 72 hours. CBG: Recent Labs  Lab  04/01/21 2045 04/02/21 0745 04/02/21 1218 04/02/21 1655 04/02/21 2100  GLUCAP 199* 159* 114* 262* 179*   Lipid Profile: No results for input(s): CHOL, HDL, LDLCALC, TRIG, CHOLHDL, LDLDIRECT in the last 72 hours. Thyroid Function Tests: No results for input(s): TSH, T4TOTAL, FREET4, T3FREE, THYROIDAB in the last 72 hours. Anemia Panel: No results for input(s): VITAMINB12, FOLATE, FERRITIN, TIBC, IRON, RETICCTPCT in the last 72 hours. Sepsis Labs: No results for input(s): PROCALCITON, LATICACIDVEN in the last 168 hours.  Recent Results (from the past 240 hour(s))  Blood culture (routine x 2)     Status: None   Collection Time: 03/24/21 10:34 PM   Specimen: BLOOD  Result Value Ref Range Status   Specimen Description BLOOD BLOOD LEFT FOREARM  Final   Special Requests   Final    BOTTLES DRAWN AEROBIC AND ANAEROBIC Blood Culture results may not be optimal due to an excessive volume of blood received in culture bottles   Culture   Final    NO GROWTH 5 DAYS Performed at [facility], [address] [place], [place] [address]    Report Status 03/29/2021 FINAL  Final  Blood culture (routine x 2)     Status: None   Collection Time: 03/24/21 10:34 PM   Specimen: BLOOD  Result Value Ref Range Status   Specimen Description BLOOD LEFT ANTECUBITAL  Final   Special Requests   Final    BOTTLES DRAWN AEROBIC AND ANAEROBIC Blood Culture results may not be optimal due to an excessive volume of blood received in culture bottles   Culture   Final    NO GROWTH 5 DAYS Performed at [facility], [address], [place], [place] [address]    Report Status 03/29/2021 FINAL  Final  SARS CORONAVIRUS 2 (TAT 6-24 HRS) Nasopharyngeal Nasopharyngeal Swab     Status: None   Collection Time: 03/24/21 10:39 PM   Specimen: Nasopharyngeal Swab  Result Value Ref Range Status   SARS Coronavirus 2 NEGATIVE NEGATIVE Final    Comment: (NOTE) SARS-CoV-2 target nucleic acids are NOT  DETECTED.  The SARS-CoV-2 RNA is generally detectable in upper and lower respiratory specimens during the acute phase of infection. Negative results do not preclude SARS-CoV-2 infection, do not rule out co-infections with other pathogens, and should not be used as the sole basis for treatment or other patient management decisions. Negative results must be combined with clinical observations, patient history, and epidemiological information. The expected result is Negative.  Fact Sheet for Patients: SugarRoll.be  Fact Sheet for Healthcare Providers: https://www.woods-mathews.com/  This test is not yet approved or cleared  by the [place] and  has been authorized for detection and/or diagnosis of SARS-CoV-2 by FDA under an Emergency Use Authorization (EUA). This EUA will remain  in effect (meaning this test can be used) for the duration of the COVID-19 declaration under Se ction 564(b)(1) of the Act, 21 U.S.C. section 360bbb-3(b)(1), unless the authorization is terminated or revoked sooner.  Performed at [facility], [place] [address], [place], [place] [address]   Aerobic Culture w Gram Stain (superficial specimen)     Status: None   Collection Time: 03/26/21  5:00 PM   Specimen: Wound  Result Value Ref Range Status   Specimen Description   Final    WOUND Performed at [facility], [address], [place], [place] [address]    Special Requests   Final    TOE Performed at [facility], [place], [place] [address]    Gram Stain   Final    FEW WBC PRESENT,BOTH PMN AND MONONUCLEAR RARE GRAM POSITIVE COCCI Performed at [facility], [place] [address], [place], [place] [address]    Culture RARE STAPHYLOCOCCUS AUREUS  Final   Report Status 03/29/2021 FINAL  Final   Organism ID, Bacteria STAPHYLOCOCCUS AUREUS  Final      Susceptibility   Staphylococcus aureus - MIC*    CIPROFLOXACIN <=0.5  SENSITIVE Sensitive     ERYTHROMYCIN <=0.25 SENSITIVE Sensitive     GENTAMICIN <=0.5 SENSITIVE Sensitive     OXACILLIN 0.5 SENSITIVE Sensitive     TETRACYCLINE <=1 SENSITIVE Sensitive     VANCOMYCIN <=0.5 SENSITIVE Sensitive     TRIMETH/SULFA <=10 SENSITIVE Sensitive     CLINDAMYCIN <=0.25 SENSITIVE Sensitive     RIFAMPIN <=0.5 SENSITIVE Sensitive     Inducible Clindamycin NEGATIVE Sensitive     * RARE STAPHYLOCOCCUS AUREUS  Aerobic/Anaerobic Culture w Gram Stain (surgical/deep wound)     Status: None (Preliminary result)   Collection Time: 04/01/21  2:12 PM   Specimen: Toe, Right; Amputation  Result Value Ref Range Status   Specimen Description   Final    WOUND Performed at [facility], [address] [place], [place] [address]    Special Requests   Final    RIGHT TOE Performed at [facility], [place] [place], [place] [address]    Gram Stain   Final    NO WBC SEEN NO ORGANISMS SEEN Performed at [facility], [place] [address], [place], [place] [address]    Culture PENDING  Incomplete   Report Status PENDING  Incomplete         Radiology Studies: No results found.      Scheduled Meds: . ascorbic acid  250 mg Oral BID  . carvedilol  3.125 mg Oral BID WC  . dapagliflozin propanediol  5 mg Oral Daily  . enoxaparin (LOVENOX) injection  40 mg Subcutaneous Q24H  . fluticasone  2 spray Each Nare Daily  . gabapentin  100 mg Oral QHS  . insulin aspart  0-15 Units Subcutaneous TID AC & HS  . insulin glargine  42 Units Subcutaneous Daily  . losartan  25 mg Oral Daily  . metroNIDAZOLE  500 mg Oral Q8H  . multivitamin with minerals  1 tablet Oral Daily  . spironolactone  25 mg Oral Daily   Continuous Infusions: .  ceFAZolin (ANCEF) IV 2 g (04/03/21 0610)     LOS: 10 days    Time spent: 30 mins  Wyvonnia Dusky, MD [facility] Pager [phone]  If 7PM-7AM, please contact night-coverage 04/03/2021, 7:23 AM

## 2021-04-04 LAB — BASIC METABOLIC PANEL
Anion gap: 7 (ref 5–15)
BUN: 15 mg/dL (ref 6–20)
CO2: 27 mmol/L (ref 22–32)
Calcium: 8.5 mg/dL — ABNORMAL LOW (ref 8.9–10.3)
Chloride: 105 mmol/L (ref 98–111)
Creatinine, Ser: 0.92 mg/dL (ref 0.61–1.24)
GFR, Estimated: >60 mL/min (ref >60)
Glucose, Bld: 148 mg/dL — ABNORMAL HIGH (ref 70–99)
Potassium: 4.3 mmol/L (ref 3.5–5.1)
Sodium: 139 mmol/L (ref 135–145)

## 2021-04-04 LAB — CBC
HCT: 32.4 % — ABNORMAL LOW (ref 39.0–52.0)
Hemoglobin: 9.8 g/dL — ABNORMAL LOW (ref 13.0–17.0)
MCH: 25.4 pg — ABNORMAL LOW (ref 26.0–34.0)
MCHC: 30.2 g/dL (ref 30.0–36.0)
MCV: 83.9 fL (ref 80.0–100.0)
Platelets: 295 10*3/uL (ref 150–400)
RBC: 3.86 MIL/uL — ABNORMAL LOW (ref 4.22–5.81)
RDW: 15.3 % (ref 11.5–15.5)
WBC: 7 10*3/uL (ref 4.0–10.5)
nRBC: 0 % (ref 0.0–0.2)

## 2021-04-04 LAB — GLUCOSE, CAPILLARY
Glucose-Capillary: 146 mg/dL — ABNORMAL HIGH (ref 70–99)
Glucose-Capillary: 162 mg/dL — ABNORMAL HIGH (ref 70–99)
Glucose-Capillary: 112 mg/dL — ABNORMAL HIGH (ref 70–99)
Glucose-Capillary: 152 mg/dL — ABNORMAL HIGH (ref 70–99)
Glucose-Capillary: 79 mg/dL (ref 70–99)

## 2021-04-04 LAB — SURGICAL PATHOLOGY

## 2021-04-04 NOTE — TOC Progression Note (Signed)
Transition of Care [facility]) - Progression Note    Patient Details  Name: Joshua [name] MRN: [id number] Date of Birth: 04/27/1971  Transition of Care [facility]) CM/SW [place], RN Phone Number: 04/04/2021, 1:32 PM  Clinical Narrative:   Provided the patient with clothing 1 pair of shorts, 1 t shirt, 1 pair of sweat pants and 1 pair of shoes to take home         Expected Discharge Plan and Services                                                 Social Determinants of Health (SDOH) Interventions    Readmission Risk Interventions Readmission Risk Prevention Plan 09/10/2018  Post Dischage Appt Complete  Medication Screening Complete  Transportation Screening Complete  PCP follow-up Complete  Some recent data might be hidden

## 2021-04-04 NOTE — Progress Notes (Signed)
PROGRESS NOTE    Joshua [name]  [id number] DOB: February 07, 1971 DOA: 03/24/2021 PCP: Patient, No Pcp Per (Inactive)  Assessment & Plan:   Active Problems:   Chronic combined systolic (congestive) and diastolic (congestive) heart failure (HCC)   Acute osteomyelitis of toe, right (HCC)   Sepsis: secondary to osteomyelitis of the right big toe. Continue on IV cefazolin while inpatient and switch to doxycycline outpatient as per ID. Resolved    Acute on chronic osteomyelitis: continue on IV cefazolin while inpatient and switch to po doxy at d/c as per ID. Wound cx growing MSSA. 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   HTN: continue on losartan, coreg   DM2: likely poorly controlled. Continue on lantus, SSI w/ accuchecks. Pt declines to have a carb modified diet and requests a regular diet   Chronic combined CHF: continue on coreg, losartan. Monitor I/Os   Normocytic anemia: no need for a transfusion at this time. H&H are labile    DVT prophylaxis: lovenox  Code Status: full  Family Communication:  Disposition Plan: unclear as pt is homeless, has no clothes to wear. CM is aware and working on this   Level of care: Med-Surg   Status is: Inpatient  Remains inpatient appropriate because:Ongoing diagnostic testing needed not appropriate for outpatient work up, Unsafe d/c plan, IV treatments appropriate due to intensity of illness or inability to take PO and Inpatient level of care appropriate due to severity of illness   Dispo: The patient is from: Home              Anticipated d/c is to: Home              Patient currently is not medically stable to d/c.   Difficult to place patient Yes    Consultants:   Podiatry  ID   Procedures:   Antimicrobials: cefazolin    Subjective: Pt c/o fatigue   Objective: Vitals:   04/03/21 1659 04/03/21 1932 04/04/21 0044 04/04/21 0413  BP: 105/69 107/65 114/77 109/75  Pulse:  84 70 73 74  Resp: 16 17 17 17   Temp: 98.4 F (36.9 C) 98.6 F (37 C) 98.6 F (37 C) 98.1 F (36.7 C)  TempSrc:  Oral Oral   SpO2: 96% 99% 100% 100%  Weight:      Height:        Intake/Output Summary (Last 24 hours) at 04/04/2021 0728 Last data filed at 04/04/2021 0300 Gross per 24 hour  Intake 2143.26 ml  Output 300 ml  Net 1843.26 ml   Filed Weights   03/24/21 2140  Weight: 82.5 kg    Examination:  General exam: Appears comfortable. Missing multiple teeth  Respiratory system: clear breath sounds b/l  Cardiovascular system: S1 & S2+. No gallops or clicks  Gastrointestinal system: Abd is soft, NT, ND & normal bowel sounds  Central nervous system: Alert and oriented. Moves all extremities  Psychiatry: Judgement and insight appear normal. Flat mood and affect    Data Reviewed: I have personally reviewed following labs and imaging studies  CBC: Recent Labs  Lab 04/02/21 0605 04/03/21 0509 04/04/21 0330  WBC 7.4 7.9 7.0  NEUTROABS 3.8  --   --   HGB 10.8* 9.9* 9.8*  HCT 34.3* 31.7* 32.4*  MCV 83.5 83.4 83.9  PLT 342 329 [id number]   Basic Metabolic Panel: Recent Labs  Lab 03/30/21 0614 04/02/21 0605 04/03/21 0509 04/04/21 0330  NA  --  136 136  139  K  --  4.5 4.5 4.3  CL  --  101 103 105  CO2  --  27 27 27   GLUCOSE  --  160* 211* 148*  BUN  --  15 18 15   CREATININE 0.91 1.04 0.98 0.92  CALCIUM  --  8.6* 8.6* 8.5*   GFR: Estimated Creatinine Clearance: 100.3 mL/min (by C-G formula based on SCr of 0.92 mg/dL). Liver Function Tests: No results for input(s): AST, ALT, ALKPHOS, BILITOT, PROT, ALBUMIN in the last 168 hours. No results for input(s): LIPASE, AMYLASE in the last 168 hours. No results for input(s): AMMONIA in the last 168 hours. Coagulation Profile: No results for input(s): INR, PROTIME in the last 168 hours. Cardiac Enzymes: No results for input(s): CKTOTAL, CKMB, CKMBINDEX, TROPONINI in the last 168 hours. BNP (last 3 results) No results for  input(s): PROBNP in the last 8760 hours. HbA1C: No results for input(s): HGBA1C in the last 72 hours. CBG: Recent Labs  Lab 04/03/21 1154 04/03/21 1658 04/03/21 2004 04/03/21 2247 04/04/21 0443  GLUCAP 125* 252* 141* 175* 146*   Lipid Profile: No results for input(s): CHOL, HDL, LDLCALC, TRIG, CHOLHDL, LDLDIRECT in the last 72 hours. Thyroid Function Tests: No results for input(s): TSH, T4TOTAL, FREET4, T3FREE, THYROIDAB in the last 72 hours. Anemia Panel: No results for input(s): VITAMINB12, FOLATE, FERRITIN, TIBC, IRON, RETICCTPCT in the last 72 hours. Sepsis Labs: No results for input(s): PROCALCITON, LATICACIDVEN in the last 168 hours.  Recent Results (from the past 240 hour(s))  Aerobic Culture w Gram Stain (superficial specimen)     Status: None   Collection Time: 03/26/21  5:00 PM   Specimen: Wound  Result Value Ref Range Status   Specimen Description   Final    WOUND Performed at [facility], [address], [place], [place] [address]    Special Requests   Final    TOE Performed at [facility], [place] [place], [place] [address]    Gram Stain   Final    FEW WBC PRESENT,BOTH PMN AND MONONUCLEAR RARE GRAM POSITIVE COCCI Performed at [facility], [place] [address], [place], [place] [address]    Culture RARE STAPHYLOCOCCUS AUREUS  Final   Report Status 03/29/2021 FINAL  Final   Organism ID, Bacteria STAPHYLOCOCCUS AUREUS  Final      Susceptibility   Staphylococcus aureus - MIC*    CIPROFLOXACIN <=0.5 SENSITIVE Sensitive     ERYTHROMYCIN <=0.25 SENSITIVE Sensitive     GENTAMICIN <=0.5 SENSITIVE Sensitive     OXACILLIN 0.5 SENSITIVE Sensitive     TETRACYCLINE <=1 SENSITIVE Sensitive     VANCOMYCIN <=0.5 SENSITIVE Sensitive     TRIMETH/SULFA <=10 SENSITIVE Sensitive     CLINDAMYCIN <=0.25 SENSITIVE Sensitive     RIFAMPIN <=0.5 SENSITIVE Sensitive     Inducible Clindamycin NEGATIVE Sensitive     * RARE STAPHYLOCOCCUS AUREUS   Aerobic/Anaerobic Culture w Gram Stain (surgical/deep wound)     Status: None (Preliminary result)   Collection Time: 04/01/21  2:12 PM   Specimen: Toe, Right; Amputation  Result Value Ref Range Status   Specimen Description   Final    WOUND Performed at [facility], [address], [place], [place] [address]    Special Requests   Final    RIGHT TOE Performed at [facility], [place], [place] [address]    Gram Stain NO WBC SEEN NO ORGANISMS SEEN   Final  Culture   Final    NO GROWTH 1 DAY Performed at [facility], [place] [address], [place], [place] [address]    Report Status PENDING  Incomplete         Radiology Studies: No results found.      Scheduled Meds: . ascorbic acid  250 mg Oral BID  . carvedilol  3.125 mg Oral BID WC  . dapagliflozin propanediol  5 mg Oral Daily  . enoxaparin (LOVENOX) injection  40 mg Subcutaneous Q24H  . fluticasone  2 spray Each Nare Daily  . gabapentin  100 mg Oral QHS  . insulin aspart  0-15 Units Subcutaneous TID AC & HS  . insulin glargine  42 Units Subcutaneous Daily  . losartan  25 mg Oral Daily  . metroNIDAZOLE  500 mg Oral Q8H  . multivitamin with minerals  1 tablet Oral Daily  . spironolactone  25 mg Oral Daily   Continuous Infusions: . sodium chloride 250 mL (04/04/21 0520)  .  ceFAZolin (ANCEF) IV 2 g (04/04/21 0521)     LOS: 11 days    Time spent: 35 mins    Wyvonnia Dusky, MD [facility] Pager [phone]  If 7PM-7AM, please contact night-coverage 04/04/2021, 7:28 AM

## 2021-04-04 NOTE — Plan of Care (Signed)

## 2021-04-05 DIAGNOSIS — Z794 Long term (current) use of insulin: Secondary | ICD-10-CM

## 2021-04-05 DIAGNOSIS — E11621 Type 2 diabetes mellitus with foot ulcer: Secondary | ICD-10-CM

## 2021-04-05 DIAGNOSIS — L97509 Non-pressure chronic ulcer of other part of unspecified foot with unspecified severity: Secondary | ICD-10-CM

## 2021-04-05 LAB — CBC
HCT: 32.4 % — ABNORMAL LOW (ref 39.0–52.0)
Hemoglobin: 10.2 g/dL — ABNORMAL LOW (ref 13.0–17.0)
MCH: 26.6 pg (ref 26.0–34.0)
MCHC: 31.5 g/dL (ref 30.0–36.0)
MCV: 84.4 fL (ref 80.0–100.0)
Platelets: 266 10*3/uL (ref 150–400)
RBC: 3.84 MIL/uL — ABNORMAL LOW (ref 4.22–5.81)
RDW: 15.4 % (ref 11.5–15.5)
WBC: 6.6 10*3/uL (ref 4.0–10.5)
nRBC: 0 % (ref 0.0–0.2)

## 2021-04-05 LAB — BASIC METABOLIC PANEL
Anion gap: 8 (ref 5–15)
BUN: 13 mg/dL (ref 6–20)
CO2: 26 mmol/L (ref 22–32)
Calcium: 8.6 mg/dL — ABNORMAL LOW (ref 8.9–10.3)
Chloride: 104 mmol/L (ref 98–111)
Creatinine, Ser: 0.98 mg/dL (ref 0.61–1.24)
GFR, Estimated: >60 mL/min (ref >60)
Glucose, Bld: 129 mg/dL — ABNORMAL HIGH (ref 70–99)
Potassium: 4 mmol/L (ref 3.5–5.1)
Sodium: 138 mmol/L (ref 135–145)

## 2021-04-05 LAB — GLUCOSE, CAPILLARY
Glucose-Capillary: 207 mg/dL — ABNORMAL HIGH (ref 70–99)
Glucose-Capillary: 200 mg/dL — ABNORMAL HIGH (ref 70–99)

## 2021-04-05 MED ORDER — DOXYCYCLINE HYCLATE 100 MG PO CAPS: 100.0000 mg | ORAL_CAPSULE | Freq: Two times a day (BID) | ORAL | 0 refills | Status: AC

## 2021-04-05 MED ORDER — OXYCODONE HCL 5 MG PO TABS: 5.0000 mg | ORAL_TABLET | Freq: Four times a day (QID) | ORAL | 0 refills | Status: AC | PRN

## 2021-04-05 NOTE — Discharge Summary (Signed)
Physician Discharge Summary  Joshua [name] [id number] DOB: 04-05-1971 DOA: 03/24/2021  PCP: Patient, No Pcp Per (Inactive)  Admit date: 03/24/2021 Discharge date: 04/05/2021  Admitted From: home  Disposition: home  Recommendations for Outpatient Follow-up:  1. Follow up with PCP in 1-2 weeks 2. F/u w/ podiatry, Dr. Cleda Mccreedy, in 3-4 days  Home Health: no  Equipment/Devices:  Discharge Condition: stable  CODE STATUS: full  Diet recommendation: Heart Healthy / Carb Modified  Brief/Interim Summary: HPI was taken from Dr. Sidney Ace: Joshua [name] is a 50 y.o. African-American male with medical history significant for type II obese mellitus, CHF and hypertension, presented to the emergency room with acute onset of worsening right foot swelling extending from his big toe to the lower leg currently is associated erythema, tenderness, tightness with warmth.  He stated that it has not been able to put weight on his right leg.he admitted to fever and chills.  He has been having some nausea and vomiting without abdominal pain.  No bilious vomitus or hematemesis.  No chest pain or dyspnea or cough or wheezing.  The patient had an osteomyelitis of the distal phalanx of the right big toe on 02/15/2019 22 foot MRI at which time he refused amputation. ED Course: When he came to the ER, he was febrile to 101 with a heart rate 108 and respiratory to 22 with normal pulse oximetry.  Labs revealed mild hyponatremia and glucose of 190 with albumin 3.3.  Lactic acid was 1.1.  CBC showed leukocytosis of 18.9 and anemia.  Blood cultures were drawn. COVID-19 PCR is currently pending.  Imaging: Right foot x-ray revealed the following: 1. Interval increase in bony erosion of the tuft of the distal phalanx right great toe, consistent with osteomyelitis. 2. Soft tissue edema about the great toe.  The patient was given IV vancomycin and cefepime and tolerated bolus of IV lactated Ringer as well as 4 mg of  IV morphine sulfate.  He will be admitted to a progressive unit bed for further evaluation and management.  From Dr. Mal Misty: Joshua [name] is a 50 y.o. male with medical history significant for chronic systolic and diastolic CHF, type 2 diabetes mellitus, hypertension,Cocaine abuse, CAD, tobacco use disorder, chronic anemia, recent hospitalization for sepsis secondary to multifocal pneumonia and osteomyelitis of the right great toe.  He had been seen by podiatrist in the past and amputation of the right big toe had been recommended.  However, patient had refused to have amputation done and preferred to treat right big toe osteomyelitis with antibiotics.    He presented to the hospital because of pain and swelling extending from the right big toe through the foot to the right lower leg.  He also complained of redness and warmth of the right foot and right leg.  He was admitted to the hospital for sepsis secondary to acute on chronic osteomyelitis of the right big toe and right lower extremity cellulitis.   Hospital course from Dr. Jimmye Norman 4/20-4/23/22: Pt was already s/p amputation of right great toe. Pt was on IV cefazolin while inpatient and switched to po doxycline at d/c as per ID. Pt will keep his foot bandaged, clean, dry & intact. Pt will f/u outpatient w/ Dr. Cleda Mccreedy (podiatry) in 3-4 days for dressing change. Pt verbalized his understanding. Of note, pt is homeless but has medicaid so pt will be able to get his medications from his pharmacy and was given clothes prior to d/c. For more information, please see previous  progress/consult notes.   Discharge Diagnoses:  Active Problems:   Chronic combined systolic (congestive) and diastolic (congestive) heart failure (HCC)   Acute osteomyelitis of toe, right (HCC)  Sepsis: secondary to osteomyelitis of the right big toe. Continue on IV cefazolin while inpatient and switch to doxycycline outpatient as per ID. Resolved   Acute on chronic  osteomyelitis: continue on IV cefazolin while inpatient and switch to po doxy at d/c as per ID. Wound cx growing MSSA. 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   HTN: continue on losartan, coreg   DM2: likely poorly controlled. Continue on lantus, SSI w/ accuchecks. Pt declines to have a carb modified diet and requests a regular diet   Chronic combined CHF: continue on coreg, losartan. Monitor I/Os   Normocytic anemia: no need for a transfusion at this time. H&H are labile    Discharge Instructions  Discharge Instructions    Diet Carb Modified   Complete by: As directed    Discharge instructions   Complete by: As directed    F/u w/ podiatry, Dr. Cleda Mccreedy, in 3-4 days. Needs to keep this clean and dry and he does not need to remove or change his bandage. F/u w/ PCP in 1 week   Increase activity slowly   Complete by: As directed    No wound care   Complete by: As directed      Allergies as of 04/05/2021   No Known Allergies     Medication List    STOP taking these medications   amoxicillin-clavulanate 875-125 MG tablet Commonly known as: Augmentin     TAKE these medications   ascorbic acid 250 MG tablet Commonly known as: VITAMIN C Take 1 tablet (250 mg total) by mouth 2 (two) times daily.   carvedilol 3.125 MG tablet Commonly known as: COREG Take 1 tablet (3.125 mg total) by mouth 2 (two) times daily with a meal.   dapagliflozin propanediol 5 MG Tabs tablet Commonly known as: FARXIGA Take 1 tablet (5 mg total) by mouth daily.   doxycycline 100 MG capsule Commonly known as: VIBRAMYCIN Take 1 capsule (100 mg total) by mouth 2 (two) times daily for 14 days.   fluticasone 50 MCG/ACT nasal spray Commonly known as: FLONASE Place 2 sprays into both nostrils daily.   furosemide 20 MG tablet Commonly known as: Lasix Take 1 tablet (20 mg total) by mouth daily as needed (for weight gain >3lbs in 1 days and >5lbs in 2 days).    gabapentin 100 MG capsule Commonly known as: NEURONTIN Take 1 capsule (100 mg total) by mouth at bedtime.   insulin glargine 100 UNIT/ML Solostar Pen Commonly known as: LANTUS Inject 40 Units into the skin daily.   losartan 25 MG tablet Commonly known as: Cozaar Take 1 tablet (25 mg total) by mouth daily.   metFORMIN 500 MG tablet Commonly known as: Glucophage Take 1 tablet (500 mg total) by mouth daily with breakfast.   multivitamin with minerals Tabs tablet Take 1 tablet by mouth daily.   oxyCODONE 5 MG immediate release tablet Commonly known as: Oxy IR/ROXICODONE Take 1 tablet (5 mg total) by mouth every 6 (six) hours as needed for up to 3 days for moderate pain or severe pain.   Pen Needles 31G X 5 MM Misc 40 Units by Does not apply route at bedtime.   spironolactone 25 MG tablet Commonly known as: ALDACTONE Take 1 tablet (25 mg total) by mouth daily.  No Known Allergies  Consultations: Podiatry ID   Procedures/Studies: DG Chest 2 View  Result Date: 03/11/2021 CLINICAL DATA:  Chest pain EXAM: CHEST - 2 VIEW COMPARISON:  10/03/2020 FINDINGS: The heart size and mediastinal contours are within normal limits. Both lungs are clear. The visualized skeletal structures are unremarkable. IMPRESSION: No acute abnormality of the lungs. Electronically Signed   By: Eddie Candle M.D.   On: 03/11/2021 12:27   CT Angio Chest PE W and/or Wo Contrast  Result Date: 03/11/2021 CLINICAL DATA:  Chest pain EXAM: CT ANGIOGRAPHY CHEST WITH CONTRAST TECHNIQUE: Multidetector CT imaging of the chest was performed using the standard protocol during bolus administration of intravenous contrast. Multiplanar CT image reconstructions and MIPs were obtained to evaluate the vascular anatomy. CONTRAST:  39mL OMNIPAQUE IOHEXOL 350 MG/ML SOLN COMPARISON:  Chest radiograph March 11, 2021 FINDINGS: Cardiovascular: There is no demonstrable pulmonary embolus. There is no thoracic aortic aneurysm or  dissection. Visualized great vessels appear normal. Right innominate and left common carotid arteries arise as a common trunk, there is calcification in multiple coronary arteries. There is no pericardial effusion or pericardial thickening. Mediastinum/Nodes: Thyroid appears normal. No evident thoracic adenopathy. Small hiatal hernia noted. Lungs/Pleura: There is ill-defined airspace opacity in portions of each lower lobe, with airspace opacity in portions of all segments of the right lower lobe and in portions of the superior segment, posterior segment, and lateral segment left lower lobe. Upper lobes and right middle lobe are clear. No pleural effusions are evident. No pneumothorax. Trachea and major bronchial structures are patent. Upper Abdomen: Stomach mildly distended with fluid and air. Visualized upper abdominal structures otherwise appear unremarkable. Musculoskeletal: No blastic or lytic bone lesions. No evident chest wall lesions. Review of the MIP images confirms the above findings. IMPRESSION: 1. no demonstrable pulmonary embolus. No thoracic aortic aneurysm or dissection. There are multiple foci of coronary artery calcification. 2. Areas of pneumonia in each lower lobe, slightly more severe on the right than the left. No pleural effusions. 3.  Small hiatal hernia. 4.  No evident adenopathy. Electronically Signed   By: Lowella Grip III M.D.   On: 03/11/2021 15:15   MR FOOT RIGHT W WO CONTRAST  Result Date: 03/25/2021 CLINICAL DATA:  Type 2 diabetes.  Infection of the great toe. EXAM: MRI OF THE RIGHT FOREFOOT WITHOUT AND WITH CONTRAST TECHNIQUE: Multiplanar, multisequence MR imaging of the right forefoot was performed before and after the administration of intravenous contrast. CONTRAST:  54mL GADAVIST GADOBUTROL 1 MMOL/ML IV SOLN COMPARISON:  None. FINDINGS: Bones/Joint/Cartilage Severe soft tissue edema of the great toe extending into the more proximal medial forefoot with enhancement most  consistent with cellulitis. Complex fluid collection along the dorsal aspect of the great toe measuring 1.8 x 0.4 x 2.6 cm consistent with an abscess. Large skin blister along the medial aspect of the great toe. Severe bone marrow edema in the first distal phalanx and head of the first proximal phalanx most concerning for osteomyelitis. Mild osteoarthritis of the first MTP joint with subchondral reactive marrow changes. No acute fracture or dislocation. Normal alignment. No joint effusion. Ligaments Collateral ligaments are intact.  Lisfranc ligament is intact. Muscles and Tendons Flexor, peroneal and extensor compartment tendons are intact. Mild edema in the plantar musculature likely neurogenic. Soft tissue No fluid collection or hematoma.  No soft tissue mass. IMPRESSION: 1. Severe bone marrow edema in the first distal phalanx and head of the first proximal phalanx most concerning for osteomyelitis. Severe cellulitis of  the great toe extending into the more proximal medial forefoot. Complex fluid collection along the dorsal aspect of the great toe measuring 1.8 x 0.4 x 2.6 cm consistent with an abscess. Electronically Signed   By: Elige Ko   On: 03/25/2021 06:24   MR TIBIA FIBULA RIGHT W WO CONTRAST  Result Date: 03/25/2021 CLINICAL DATA:  Soft tissue infection of the lower leg EXAM: MRI OF LOWER RIGHT EXTREMITY WITHOUT AND WITH CONTRAST TECHNIQUE: Multiplanar, multisequence MR imaging of the right lower leg was performed both before and after administration of intravenous contrast. CONTRAST:  33mL GADAVIST GADOBUTROL 1 MMOL/ML IV SOLN COMPARISON:  None. FINDINGS: Bones/Joint/Cartilage No marrow signal abnormality. No fracture or dislocation. Normal alignment. No joint effusion. Ligaments Collateral ligaments are intact. Muscles and Tendons Muscles are normal. Flexor, extensor, peroneal and Achilles tendons are intact. Soft tissue No fluid collection or hematoma. No soft tissue mass. Mild soft tissue edema  along the medial aspect of the right lower leg with mild enhancement on postcontrast imaging concerning for mild cellulitis. No drainable fluid collection to suggest an abscess. Cystic mass along the popliteus muscle likely reflecting a ganglion cyst measuring 1.8 cm. Small Baker's cyst. IMPRESSION: 1. No osteomyelitis of the right lower leg. 2. Mild cellulitis of the right lower leg. No drainable fluid collection to suggest an abscess. Electronically Signed   By: Elige Ko   On: 03/25/2021 06:36   MR TOES RIGHT W WO CONTRAST  Result Date: 03/31/2021 CLINICAL DATA:  Re-evaluation of osteomyelitis of the great toe. EXAM: MRI OF THE RIGHT TOES WITHOUT AND WITH CONTRAST TECHNIQUE: Multiplanar, multisequence MR imaging of the right forefoot was performed both before and after administration of intravenous contrast. CONTRAST:  81mL GADAVIST GADOBUTROL 1 MMOL/ML IV SOLN COMPARISON:  03/25/2021 FINDINGS: Bones/Joint/Cartilage Severe soft tissue edema of the great toe extending into the more proximal medial forefoot with enhancement most consistent with cellulitis. Complex fluid collection along the dorsal aspect of the great toe measuring 2 x 0.5 x 2.6 cm consistent with an abscess. Large skin blister along the medial aspect of the great toe. Severe bone marrow edema in the first distal phalanx and head of the first proximal phalanx most concerning for osteomyelitis. Mild osteoarthritis of the first MTP joint with subchondral reactive marrow changes. No acute fracture or dislocation. Normal alignment. No joint effusion. Ligaments Collateral ligaments are intact.  Lisfranc ligament is intact. Muscles and Tendons Flexor, peroneal and extensor compartment tendons are intact. Mild edema in the plantar musculature likely neurogenic. Soft tissue No other fluid collection or hematoma.  No soft tissue mass. IMPRESSION: 1. Severe cellulitis of the great toe extending into the more proximal medial forefoot. Complex fluid  collection along the dorsal aspect of the great toe measuring 1.8 x 0.4 x 2.6 cm consistent with an abscess. Osteomyelitis of the first distal phalanx and to lesser extent head of the first proximal phalanx. No significant interval change compared with 03/25/2021. Electronically Signed   By: Elige Ko   On: 03/31/2021 08:16   US Venous Img Lower Bilateral (DVT)  Result Date: 03/11/2021 CLINICAL DATA:  Elevated D-dimer and leg pain EXAM: BILATERAL LOWER EXTREMITY VENOUS DOPPLER ULTRASOUND TECHNIQUE: Gray-scale sonography with graded compression, as well as color Doppler and duplex ultrasound were performed to evaluate the lower extremity deep venous systems from the level of the common femoral vein and including the common femoral, femoral, profunda femoral, popliteal and calf veins including the posterior tibial, peroneal and gastrocnemius veins when visible.  The superficial great saphenous vein was also interrogated. Spectral Doppler was utilized to evaluate flow at rest and with distal augmentation maneuvers in the common femoral, femoral and popliteal veins. COMPARISON:  None. FINDINGS: RIGHT LOWER EXTREMITY Common Femoral Vein: No evidence of thrombus. Normal compressibility, respiratory phasicity and response to augmentation. Saphenofemoral Junction: No evidence of thrombus. Normal compressibility and flow on color Doppler imaging. Profunda Femoral Vein: No evidence of thrombus. Normal compressibility and flow on color Doppler imaging. Femoral Vein: No evidence of thrombus. Normal compressibility, respiratory phasicity and response to augmentation. Popliteal Vein: No evidence of thrombus. Normal compressibility, respiratory phasicity and response to augmentation. Calf Veins: No evidence of thrombus. Normal compressibility and flow on color Doppler imaging. Superficial Great Saphenous Vein: No evidence of thrombus. Normal compressibility. Venous Reflux:  None. Other Findings:  None. LEFT LOWER EXTREMITY  Common Femoral Vein: No evidence of thrombus. Normal compressibility, respiratory phasicity and response to augmentation. Saphenofemoral Junction: No evidence of thrombus. Normal compressibility and flow on color Doppler imaging. Profunda Femoral Vein: No evidence of thrombus. Normal compressibility and flow on color Doppler imaging. Femoral Vein: No evidence of thrombus. Normal compressibility, respiratory phasicity and response to augmentation. Popliteal Vein: No evidence of thrombus. Normal compressibility, respiratory phasicity and response to augmentation. Calf Veins: No evidence of thrombus. Normal compressibility and flow on color Doppler imaging. Superficial Great Saphenous Vein: No evidence of thrombus. Normal compressibility. Venous Reflux:  None. Other Findings:  None. IMPRESSION: No evidence of deep venous thrombosis in either lower extremity. Electronically Signed   By: Inez Catalina M.D.   On: 03/11/2021 19:53   US Venous Img Lower Unilateral Right  Result Date: 03/24/2021 CLINICAL DATA:  Right leg pain, palpable cord around femoral vein EXAM: RIGHT LOWER EXTREMITY VENOUS DOPPLER ULTRASOUND TECHNIQUE: Gray-scale sonography with compression, as well as color and duplex ultrasound, were performed to evaluate the deep venous system(s) from the level of the common femoral vein through the popliteal and proximal calf veins. COMPARISON:  03/11/2021 FINDINGS: VENOUS Normal compressibility of the common femoral, superficial femoral, and popliteal veins, as well as the visualized calf veins. Visualized portions of profunda femoral vein and great saphenous vein unremarkable. No filling defects to suggest DVT on grayscale or color Doppler imaging. Doppler waveforms show normal direction of venous flow, normal respiratory plasticity and response to augmentation. Limited views of the contralateral common femoral vein are unremarkable. OTHER None. Limitations: none IMPRESSION: 1. No evidence of deep venous  thrombosis within the right lower extremity. Electronically Signed   By: Randa Ngo M.D.   On: 03/24/2021 23:50   DG Foot Complete Right  Result Date: 03/24/2021 CLINICAL DATA:  Great toe osteomyelitis EXAM: RIGHT FOOT COMPLETE - 3+ VIEW COMPARISON:  03/11/2021 FINDINGS: Interval increase in bony erosion of the tuft of the distal phalanx right great toe. No other bony abnormality. No fracture. Joint spaces are preserved. Soft tissue edema about the great toe. IMPRESSION: 1. Interval increase in bony erosion of the tuft of the distal phalanx right great toe, consistent with osteomyelitis. 2. Soft tissue edema about the great toe. Electronically Signed   By: Eddie Candle M.D.   On: 03/24/2021 22:54   DG Foot Complete Right  Result Date: 03/11/2021 CLINICAL DATA:  Foot pain EXAM: RIGHT FOOT COMPLETE - 3+ VIEW COMPARISON:  None. FINDINGS: No acute fracture or dislocation. Mild irregularity along the distal medial tuft of the first distal phalanx. Soft tissues are unremarkable. IMPRESSION: No acute osseous injury of the right foot. Mild  irregularity along the distal medial tuft of the first distal phalanx concerning for osteomyelitis. Electronically Signed   By: Kathreen Devoid   On: 03/11/2021 13:54      Subjective: pt c/o right foot pain intermittently    Discharge Exam: Vitals:   04/05/21 0518 04/05/21 0733  BP: 104/68 105/68  Pulse: 63 65  Resp: 16 14  Temp: 98 F (36.7 C) 98.4 F (36.9 C)  SpO2: 99% 99%   Vitals:   04/04/21 2115 04/04/21 2335 04/05/21 0518 04/05/21 0733  BP: 104/79 112/78 104/68 105/68  Pulse: 78 69 63 65  Resp: 15 17 16 14   Temp: 97.9 F (36.6 C) (!) 97.5 F (36.4 C) 98 F (36.7 C) 98.4 F (36.9 C)  TempSrc:      SpO2: 100% 100% 99% 99%  Weight:      Height:        General: Pt is alert, awake, not in acute distress Cardiovascular: S1/S2 +, no rubs, no gallops Respiratory: CTA bilaterally, no wheezing, no rhonchi Abdominal: Soft, NT, ND, bowel sounds  + Extremities: Right foot is dressed & dressing is C/D/I. No cyanosis    The results of significant diagnostics from this hospitalization (including imaging, microbiology, ancillary and laboratory) are listed below for reference.     Microbiology: Recent Results (from the past 240 hour(s))  Aerobic Culture w Gram Stain (superficial specimen)     Status: None   Collection Time: 03/26/21  5:00 PM   Specimen: Wound  Result Value Ref Range Status   Specimen Description   Final    WOUND Performed at [facility], [address], [place], [place] [address]    Special Requests   Final    TOE Performed at [facility], [address] [place], [place] [address]    Gram Stain   Final    FEW WBC PRESENT,BOTH PMN AND MONONUCLEAR RARE GRAM POSITIVE COCCI Performed at [facility], [place] [address], [place], [place] [address]    Culture RARE STAPHYLOCOCCUS AUREUS  Final   Report Status 03/29/2021 FINAL  Final   Organism ID, Bacteria STAPHYLOCOCCUS AUREUS  Final      Susceptibility   Staphylococcus aureus - MIC*    CIPROFLOXACIN <=0.5 SENSITIVE Sensitive     ERYTHROMYCIN <=0.25 SENSITIVE Sensitive     GENTAMICIN <=0.5 SENSITIVE Sensitive     OXACILLIN 0.5 SENSITIVE Sensitive     TETRACYCLINE <=1 SENSITIVE Sensitive     VANCOMYCIN <=0.5 SENSITIVE Sensitive     TRIMETH/SULFA <=10 SENSITIVE Sensitive     CLINDAMYCIN <=0.25 SENSITIVE Sensitive     RIFAMPIN <=0.5 SENSITIVE Sensitive     Inducible Clindamycin NEGATIVE Sensitive     * RARE STAPHYLOCOCCUS AUREUS  Aerobic/Anaerobic Culture w Gram Stain (surgical/deep wound)     Status: None (Preliminary result)   Collection Time: 04/01/21  2:12 PM   Specimen: Toe, Right; Amputation  Result Value Ref Range Status   Specimen Description   Final    WOUND Performed at [facility], [address] [place],  [address]    Special Requests   Final    RIGHT TOE Performed at [facility], [place], [place] [address]    Gram Stain NO WBC SEEN NO ORGANISMS SEEN   Final   Culture   Final    NO GROWTH 3 DAYS NO ANAEROBES ISOLATED; CULTURE IN PROGRESS FOR 5 DAYS Performed at [facility], [place],  [place] [address]    Report Status PENDING  Incomplete     Labs: BNP (last 3 results) Recent Labs    09/28/20 2005 10/03/20 2244 03/11/21 1332  BNP 947.6* 815.9* 284.1*   Basic Metabolic Panel: Recent Labs  Lab 03/30/21 0614 04/02/21 0605 04/03/21 0509 04/04/21 0330 04/05/21 0455  NA  --  136 136 139 138  K  --  4.5 4.5 4.3 4.0  CL  --  101 103 105 104  CO2  --  27 27 27 26   GLUCOSE  --  160* 211* 148* 129*  BUN  --  15 18 15 13   CREATININE 0.91 1.04 0.98 0.92 0.98  CALCIUM  --  8.6* 8.6* 8.5* 8.6*   Liver Function Tests: No results for input(s): AST, ALT, ALKPHOS, BILITOT, PROT, ALBUMIN in the last 168 hours. No results for input(s): LIPASE, AMYLASE in the last 168 hours. No results for input(s): AMMONIA in the last 168 hours. CBC: Recent Labs  Lab 04/02/21 0605 04/03/21 0509 04/04/21 0330 04/05/21 0455  WBC 7.4 7.9 7.0 6.6  NEUTROABS 3.8  --   --   --   HGB 10.8* 9.9* 9.8* 10.2*  HCT 34.3* 31.7* 32.4* 32.4*  MCV 83.5 83.4 83.9 84.4  PLT 342 329 295 266   Cardiac Enzymes: No results for input(s): CKTOTAL, CKMB, CKMBINDEX, TROPONINI in the last 168 hours. BNP: Invalid input(s): POCBNP CBG: Recent Labs  Lab 04/04/21 1218 04/04/21 1649 04/04/21 2120 04/05/21 0737 04/05/21 1210  GLUCAP 112* 152* 79 207* 200*   D-Dimer No results for input(s): DDIMER in the last 72 hours. Hgb A1c No results for input(s): HGBA1C in the last 72 hours. Lipid Profile No results for input(s): CHOL, HDL, LDLCALC, TRIG, CHOLHDL, LDLDIRECT in the last 72 hours. Thyroid function studies No results for input(s): TSH, T4TOTAL, T3FREE, THYROIDAB in the last 72 hours.  Invalid input(s): FREET3 Anemia work up No results for input(s):  VITAMINB12, FOLATE, FERRITIN, TIBC, IRON, RETICCTPCT in the last 72 hours. Urinalysis    Component Value Date/Time   COLORURINE YELLOW (A) 03/11/2021 1332   APPEARANCEUR CLEAR (A) 03/11/2021 1332   LABSPEC 1.028 03/11/2021 1332   PHURINE 6.0 03/11/2021 1332   GLUCOSEU >=500 (A) 03/11/2021 1332   HGBUR NEGATIVE 03/11/2021 1332   BILIRUBINUR NEGATIVE 03/11/2021 1332   KETONESUR NEGATIVE 03/11/2021 1332   PROTEINUR NEGATIVE 03/11/2021 1332   NITRITE NEGATIVE 03/11/2021 1332   LEUKOCYTESUR NEGATIVE 03/11/2021 1332   Sepsis Labs Invalid input(s): PROCALCITONIN,  WBC,  LACTICIDVEN Microbiology Recent Results (from the past 240 hour(s))  Aerobic Culture w Gram Stain (superficial specimen)     Status: None   Collection Time: 03/26/21  5:00 PM   Specimen: Wound  Result Value Ref Range Status   Specimen Description   Final    WOUND Performed at [facility] [facility], [address] [place], [place] [address]    Special Requests   Final    TOE Performed at [facility], [address] [place], [place] [address]    Gram Stain   Final    FEW WBC PRESENT,BOTH PMN AND MONONUCLEAR RARE GRAM POSITIVE COCCI Performed at [facility], [place] [address] [place], [place] [address]    Culture RARE STAPHYLOCOCCUS AUREUS  Final   Report Status 03/29/2021 FINAL  Final   Organism ID, Bacteria STAPHYLOCOCCUS AUREUS  Final      Susceptibility   Staphylococcus aureus - MIC*    CIPROFLOXACIN <=0.5 SENSITIVE Sensitive  ERYTHROMYCIN <=0.25 SENSITIVE Sensitive     GENTAMICIN <=0.5 SENSITIVE Sensitive     OXACILLIN 0.5 SENSITIVE Sensitive     TETRACYCLINE <=1 SENSITIVE Sensitive     VANCOMYCIN <=0.5 SENSITIVE Sensitive     TRIMETH/SULFA <=10 SENSITIVE Sensitive     CLINDAMYCIN <=0.25 SENSITIVE Sensitive     RIFAMPIN <=0.5 SENSITIVE Sensitive     Inducible Clindamycin NEGATIVE Sensitive     * RARE STAPHYLOCOCCUS AUREUS  Aerobic/Anaerobic Culture w Gram Stain (surgical/deep  wound)     Status: None (Preliminary result)   Collection Time: 04/01/21  2:12 PM   Specimen: Toe, Right; Amputation  Result Value Ref Range Status   Specimen Description   Final    WOUND Performed at [facility], [address], [place], [place] [address]    Special Requests   Final    RIGHT TOE Performed at [facility], [place], [place] [address]    Gram Stain NO WBC SEEN NO ORGANISMS SEEN   Final   Culture   Final    NO GROWTH 3 DAYS NO ANAEROBES ISOLATED; CULTURE IN PROGRESS FOR 5 DAYS Performed at [place] [address] [place], [place] [address]    Report Status PENDING  Incomplete     Time coordinating discharge: Over 30 minutes  SIGNED:   Wyvonnia Dusky, MD  [facility] 04/05/2021, 12:12 PM Pager   If 7PM-7AM, please contact night-coverage

## 2021-04-05 NOTE — Progress Notes (Signed)
No acute events overnight, pain control on oral analgesics observation continues.

## 2021-04-05 NOTE — Plan of Care (Signed)

## 2021-04-07 LAB — AEROBIC/ANAEROBIC CULTURE W GRAM STAIN (SURGICAL/DEEP WOUND)
Culture: NO GROWTH
Gram Stain: NONE SEEN

## 2021-04-08 DIAGNOSIS — E118 Type 2 diabetes mellitus with unspecified complications: Secondary | ICD-10-CM

## 2021-04-08 LAB — GLUCOSE, POCT (MANUAL RESULT ENTRY): POC Glucose: 304 mg/dl — AB (ref 70–99)

## 2021-04-08 NOTE — Congregational Nurse Program (Signed)
  Dept: [place] Nurse Program Note  Date of Encounter: 04/08/2021 Client in for blood sugar check. He was discharged from [facility] on 4/23 following admission for sepsis related to a tight toe infection. 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 Client was instructed at discharge to follow up with Dr. Cleda Mccreedy in 3 days. No appointment made at discharge. Assisted client with making an appointment. Follow up appointment made for 4/28 at 3:45 pm. Client declined the need for transportation assistance.   Past Medical History: Past Medical History:  Diagnosis Date  . Abrasion of toe with infection, right, subsequent encounter 03/27/2021  . CHF (congestive heart failure) ([place])   . Diabetes mellitus without complication ([place])   . Hypertension     Encounter Details:  CNP Questionnaire - 04/08/21 1000      Questionnaire   Do you give verbal consent to treat you today? Yes    Visit Setting Church or Organization    Location Patient Served At [facility]    Patient Status Homeless    Medical Provider No    Insurance Medicaid    Intervention Assess (including screenings);Educate;Refer    Housing/Utilities No permanent housing    Transportation Need transportation assistance    Interpersonal Safety Do not feel physically and emotionally safe where you currently live    Food Have food insecurities    Medication Have medication insecurities    Referrals PCP - other provider

## 2021-05-01 DIAGNOSIS — E118 Type 2 diabetes mellitus with unspecified complications: Secondary | ICD-10-CM

## 2021-05-01 LAB — GLUCOSE, POCT (MANUAL RESULT ENTRY): POC Glucose: 311 mg/dl — AB (ref 70–99)

## 2021-05-01 NOTE — Congregational Nurse Program (Signed)
  Dept: [place] Nurse Program Note  Date of Encounter: 05/01/2021 Client to clinic for vital sign check, blood sugar and foot care. BP 138/88, 113 97%, blood sugar 311, nonfasting. Client was staying with his brother in [place], has not been there in 3 days as his brother is unable to come pick him up. Client has other friends he is is touch with, but no stable place to stay.Surgical wound to R great toe s/p amputation at [facility] clean and well healed. Foot care provided. Unclear as to what diabetic medication he is taking at this time, reports meds are at his brother's house. Much support given. Will continue to follow as able. Past Medical History: Past Medical History:  Diagnosis Date  . Abrasion of toe with infection, right, subsequent encounter 03/27/2021  . CHF (congestive heart failure) ([place])   . Diabetes mellitus without complication ([place])   . Hypertension     Encounter Details:  CNP Questionnaire - 05/01/21 1200      Questionnaire   Do you give verbal consent to treat you today? Yes    Visit Setting Church or Organization    Location Patient Served At [facility]    Patient Status Homeless    Medical Provider No    Insurance Medicaid    Intervention Assess (including screenings);Educate;Refer    Housing/Utilities No permanent housing    Transportation Need transportation assistance    Interpersonal Safety Do not feel physically and emotionally safe where you currently live    Food Have food insecurities    Medication Have medication insecurities    Referrals PCP - other provider

## 2021-05-19 ENCOUNTER — Encounter: Payer: Self-pay | Admitting: Emergency Medicine

## 2021-05-19 ENCOUNTER — Other Ambulatory Visit: Payer: Self-pay

## 2021-05-19 ENCOUNTER — Emergency Department
Admission: EM | Admit: 2021-05-19 | Discharge: 2021-05-19 | Disposition: A | Discharge status: Home / Self Care | Payer: Medicaid Other | Attending: Emergency Medicine | Admitting: Emergency Medicine

## 2021-05-19 DIAGNOSIS — Z5900 Homelessness unspecified: Secondary | ICD-10-CM | POA: Insufficient documentation

## 2021-05-19 DIAGNOSIS — Z794 Long term (current) use of insulin: Secondary | ICD-10-CM | POA: Insufficient documentation

## 2021-05-19 DIAGNOSIS — I5042 Chronic combined systolic (congestive) and diastolic (congestive) heart failure: Secondary | ICD-10-CM | POA: Diagnosis not present

## 2021-05-19 DIAGNOSIS — Z955 Presence of coronary angioplasty implant and graft: Secondary | ICD-10-CM | POA: Insufficient documentation

## 2021-05-19 DIAGNOSIS — Z79899 Other long term (current) drug therapy: Secondary | ICD-10-CM | POA: Diagnosis not present

## 2021-05-19 DIAGNOSIS — I110 Hypertensive heart disease with heart failure: Secondary | ICD-10-CM | POA: Insufficient documentation

## 2021-05-19 DIAGNOSIS — I2510 Atherosclerotic heart disease of native coronary artery without angina pectoris: Secondary | ICD-10-CM | POA: Diagnosis not present

## 2021-05-19 DIAGNOSIS — F17200 Nicotine dependence, unspecified, uncomplicated: Secondary | ICD-10-CM | POA: Diagnosis not present

## 2021-05-19 DIAGNOSIS — E1165 Type 2 diabetes mellitus with hyperglycemia: Secondary | ICD-10-CM | POA: Insufficient documentation

## 2021-05-19 DIAGNOSIS — Z7984 Long term (current) use of oral hypoglycemic drugs: Secondary | ICD-10-CM | POA: Insufficient documentation

## 2021-05-19 DIAGNOSIS — R739 Hyperglycemia, unspecified: Secondary | ICD-10-CM

## 2021-05-19 LAB — CBG MONITORING, ED: Glucose-Capillary: 257 mg/dL — ABNORMAL HIGH (ref 70–99)

## 2021-05-19 LAB — CBC
HCT: 36.2 % — ABNORMAL LOW (ref 39.0–52.0)
Hemoglobin: 11.3 g/dL — ABNORMAL LOW (ref 13.0–17.0)
MCH: 25.9 pg — ABNORMAL LOW (ref 26.0–34.0)
MCHC: 31.2 g/dL (ref 30.0–36.0)
MCV: 83 fL (ref 80.0–100.0)
Platelets: 279 10*3/uL (ref 150–400)
RBC: 4.36 MIL/uL (ref 4.22–5.81)
RDW: 16.2 % — ABNORMAL HIGH (ref 11.5–15.5)
WBC: 5.2 10*3/uL (ref 4.0–10.5)
nRBC: 0 % (ref 0.0–0.2)

## 2021-05-19 LAB — BASIC METABOLIC PANEL
Anion gap: 6 (ref 5–15)
BUN: 11 mg/dL (ref 6–20)
CO2: 24 mmol/L (ref 22–32)
Calcium: 9.1 mg/dL (ref 8.9–10.3)
Chloride: 106 mmol/L (ref 98–111)
Creatinine, Ser: 0.93 mg/dL (ref 0.61–1.24)
GFR, Estimated: >60 mL/min (ref >60)
Glucose, Bld: 236 mg/dL — ABNORMAL HIGH (ref 70–99)
Potassium: 4.1 mmol/L (ref 3.5–5.1)
Sodium: 136 mmol/L (ref 135–145)

## 2021-05-19 LAB — ETHANOL: Alcohol, Ethyl (B): <10 mg/dL (ref <10)

## 2021-05-19 MED ORDER — SODIUM CHLORIDE 0.9 % IV BOLUS
1000.0000 mL | Freq: Once | INTRAVENOUS | Status: AC
  Administered 2021-05-19: 1000 mL via INTRAVENOUS

## 2021-05-19 NOTE — ED Notes (Signed)
Entered room to check on pt. Pt complaining that food wasn't good. Pt fell back asleep and now sleeping. VS normal.

## 2021-05-19 NOTE — ED Provider Notes (Signed)
[facility] Emergency Department Provider Note   ____________________________________________   Event Date/Time   First MD Initiated Contact with Patient 05/19/21 1112     (approximate)  I have reviewed the triage vital signs and the nursing notes.   HISTORY  Chief Complaint Hyperglycemia    HPI Joshua [name] is a 50 y.o. male with past medical history of hypertension, diabetes, and CHF who presents to the ED for hyperglycemia.  History is limited as patient refuses to interact during much of the interview.  He states that he is hungry and would like food but then attempted to roll over and go back to sleep.  Per EMS, patient was found outside by police department, who subsequently called EMS.  EMS reported that patient's blood sugar found to be elevated. Patient states that he feels fine and continues to reiterate that he would like something to eat.  He denies any fevers, cough, chest pain, shortness of breath, abdominal pain, nausea, vomiting, or dysuria.  He admits that he has not been taking his diabetic medication.        Past Medical History:  Diagnosis Date  . Abrasion of toe with infection, right, subsequent encounter 03/27/2021  . CHF (congestive heart failure) ([place])   . Diabetes mellitus without complication ([place])   . Hypertension     Patient Active Problem List   Diagnosis Date Noted  . Acute osteomyelitis of toe, right ([place]) 03/24/2021  . Community acquired pneumonia 03/11/2021  . CAD (coronary artery disease) 03/11/2021  . Sepsis ([place]) 03/11/2021  . Chest pain 03/11/2021  . HTN (hypertension) 03/11/2021  . Type 2 diabetes mellitus with complications ([place]) 37/85/8850  . Tobacco abuse 03/11/2021  . Polysubstance abuse ([place])   . Osteomyelitis of great toe of right foot ([place]) 02/18/2021  . Malnutrition of moderate degree 02/14/2021  . Cocaine abuse ([place]) 02/13/2021  . Homeless 02/13/2021  . Cellulitis of great toe of right  foot 02/12/2021  . Diabetic foot ulcer ([place]) 02/12/2021  . Lactic acidosis 02/12/2021  . Acute metabolic encephalopathy 27/74/1287  . Chronic combined systolic (congestive) and diastolic (congestive) heart failure ([place]) 02/12/2021  . SIRS (systemic inflammatory response syndrome) ([place]) 10/04/2020  . Acute respiratory failure with hypoxia ([place]) 07/15/2020  . Multifocal pneumonia 07/15/2020  . History of MI (myocardial infarction) 07/15/2020  . Symptomatic anemia 07/15/2020  . Acute ST elevation myocardial infarction (STEMI) involving left anterior descending (LAD) coronary artery ([place]) 09/09/2018  . STEMI involving left anterior descending coronary artery ([place]) 09/09/2018  . Hypoglycemia 06/02/2016    Past Surgical History:  Procedure Laterality Date  . AMPUTATION TOE Right 04/01/2021   Procedure: AMPUTATION TOE;  Surgeon: Sharlotte Alamo, DPM;  Location: [facility] ORS;  Service: Podiatry;  Laterality: Right;  RIGHT GREAT TOE  . CORONARY/GRAFT ACUTE MI REVASCULARIZATION N/A 09/09/2018   Procedure: Coronary/Graft Acute MI Revascularization;  Surgeon: Yolonda Kida, MD;  Location: [place] [facility];  Service: Cardiovascular;  Laterality: N/A;  . LEFT HEART CATH AND CORONARY ANGIOGRAPHY N/A 09/09/2018   Procedure: LEFT HEART CATH AND CORONARY ANGIOGRAPHY;  Surgeon: Yolonda Kida, MD;  Location: [place] [facility];  Service: Cardiovascular;  Laterality: N/A;  . LOWER EXTREMITY ANGIOGRAPHY Right 02/14/2021   Procedure: Lower Extremity Angiography;  Surgeon: Katha Cabal, MD;  Location: [place] [facility];  Service: Cardiovascular;  Laterality: Right;  . none    . STENT PLACEMENT VASCULAR ([place] HX)      Prior to Admission medications  Medication Sig Start Date End Date Taking? Authorizing Provider  ascorbic acid (VITAMIN C) 250 MG tablet Take 1 tablet (250 mg total) by mouth 2 (two) times daily. 02/19/21   Lorella Nimrod, MD  carvedilol (COREG) 3.125 MG tablet Take 1 tablet (3.125 mg  total) by mouth 2 (two) times daily with a meal. 02/19/21   Lorella Nimrod, MD  dapagliflozin propanediol (FARXIGA) 5 MG TABS tablet Take 1 tablet (5 mg total) by mouth daily. 02/19/21   Lorella Nimrod, MD  fluticasone (FLONASE) 50 MCG/ACT nasal spray Place 2 sprays into both nostrils daily. 10/05/20   Lavina Hamman, MD  furosemide (LASIX) 20 MG tablet Take 1 tablet (20 mg total) by mouth daily as needed (for weight gain >3lbs in 1 days and >5lbs in 2 days). 02/19/21 02/19/22  Lorella Nimrod, MD  gabapentin (NEURONTIN) 100 MG capsule Take 1 capsule (100 mg total) by mouth at bedtime. 02/19/21   Lorella Nimrod, MD  insulin glargine (LANTUS) 100 UNIT/ML Solostar Pen Inject 40 Units into the skin daily. 02/19/21   Lorella Nimrod, MD  Insulin Pen Needle (PEN NEEDLES) 31G X 5 MM MISC 40 Units by Does not apply route at bedtime. 02/19/21   Lorella Nimrod, MD  losartan (COZAAR) 25 MG tablet Take 1 tablet (25 mg total) by mouth daily. 02/19/21 02/19/22  Lorella Nimrod, MD  metFORMIN (GLUCOPHAGE) 500 MG tablet Take 1 tablet (500 mg total) by mouth daily with breakfast. 02/19/21 04/20/21  Lorella Nimrod, MD  Multiple Vitamin (MULTIVITAMIN WITH MINERALS) TABS tablet Take 1 tablet by mouth daily. 02/19/21   Lorella Nimrod, MD  spironolactone (ALDACTONE) 25 MG tablet Take 1 tablet (25 mg total) by mouth daily. 02/19/21   Lorella Nimrod, MD    Allergies Patient has no known allergies.  Family History  Problem Relation Age of Onset  . Cancer Mother   . CAD Brother     Social History Social History   Tobacco Use  . Smoking status: Current Every Day Smoker  . Smokeless tobacco: Never Used  Vaping Use  . Vaping Use: Never used  Substance Use Topics  . Alcohol use: Yes    Alcohol/week: 0.0 standard drinks  . Drug use: Not Currently    Comment: pt states no found unknown white substance in mouth    Review of Systems  Constitutional: No fever/chills.  Positive for hyperglycemia. Eyes: No visual changes. ENT: No sore  throat. Cardiovascular: Denies chest pain. Respiratory: Denies shortness of breath. Gastrointestinal: No abdominal pain.  No nausea, no vomiting.  No diarrhea.  No constipation. Genitourinary: Negative for dysuria. Musculoskeletal: Negative for back pain. Skin: Negative for rash. Neurological: Negative for headaches, focal weakness or numbness.  ____________________________________________   PHYSICAL EXAM:  VITAL SIGNS: ED Triage Vitals  Enc Vitals Group     BP 05/19/21 1024 118/85     Pulse Rate 05/19/21 1024 77     Resp 05/19/21 1024 18     Temp 05/19/21 1024 98 F (36.7 C)     Temp Source 05/19/21 1024 Oral     SpO2 05/19/21 1024 99 %     Weight 05/19/21 1019 181 lb 14.1 oz (82.5 kg)     Height 05/19/21 1019 5\' 10"  (1.778 m)     Head Circumference --      Peak Flow --      Pain Score 05/19/21 1019 0     Pain Loc --      Pain Edu? --  Excl. in [place]? --     Constitutional: Alert and oriented. Eyes: Conjunctivae are normal. Head: Atraumatic. Nose: No congestion/rhinnorhea. Mouth/Throat: Mucous membranes are moist. Neck: Normal ROM Cardiovascular: Normal rate, regular rhythm. Grossly normal heart sounds.  2+ DP pulses bilaterally. Respiratory: Normal respiratory effort.  No retractions. Lungs CTAB. Gastrointestinal: Soft and nontender. No distention. Genitourinary: deferred Musculoskeletal: No lower extremity tenderness nor edema.  Status post amputation of right great toe, well-healed with no erythema, warmth, or tenderness to palpation. Neurologic:  Normal speech and language. No gross focal neurologic deficits are appreciated. Skin:  Skin is warm, dry and intact. No rash noted. Psychiatric: Mood and affect are normal. Speech and behavior are normal.  ____________________________________________   LABS (all labs ordered are listed, but only abnormal results are displayed)  Labs Reviewed  BASIC METABOLIC PANEL - Abnormal; Notable for the following components:       Result Value   Glucose, Bld 236 (*)    All other components within normal limits  CBC - Abnormal; Notable for the following components:   Hemoglobin 11.3 (*)    HCT 36.2 (*)    MCH 25.9 (*)    RDW 16.2 (*)    All other components within normal limits  CBG MONITORING, ED - Abnormal; Notable for the following components:   Glucose-Capillary 257 (*)    All other components within normal limits  ETHANOL  URINALYSIS, COMPLETE (UACMP) WITH MICROSCOPIC  CBG MONITORING, ED  CBG MONITORING, ED    PROCEDURES  Procedure(s) performed (including Critical Care):  Procedures   ____________________________________________   INITIAL IMPRESSION / ASSESSMENT AND PLAN / ED COURSE       50 year old male with past medical history of hypertension, diabetes, and CHF who presents to the ED due to concern for hyperglycemia.  Patient denies any complaints, refuses to interact with much of interview and exam.  He does not appear somnolent or lethargic, no focal neurologic deficits noted on exam.  Labs remarkable for mild hyperglycemia but no evidence of electrolyte abnormality or DKA.  He did have recent admission for infectious process to his right foot and is status post amputation of his right great toe.  This appears to be well-healing with no evidence of acute infection.  Patient has tolerated p.o. without difficulty here in the ED and is appropriate for discharge home, was provided with resource guide for shelters in the area.      ____________________________________________   FINAL CLINICAL IMPRESSION(S) / ED DIAGNOSES  Final diagnoses:  Hyperglycemia  Homelessness     ED Discharge Orders    None       Note:  This document was prepared using Dragon voice recognition software and may include unintentional dictation errors.   Blake Divine, MD 05/19/21 [id number]

## 2021-05-19 NOTE — ED Notes (Signed)
See triage note. Pt in room sleeping. Covered with blanket and connected to phillips monitor.

## 2021-05-19 NOTE — ED Notes (Signed)
Pt requesting food. Provided [place] sandwich tray and

## 2021-05-19 NOTE — ED Triage Notes (Signed)
See first RN Note; Pt appears lethargic in triage intermittently, then opens eyes and looks around. Pt states he thinks the police man called EMS.   Pt c/o bilateral lower extremity and bilateral foot pain.

## 2021-05-19 NOTE — ED Triage Notes (Signed)
First Nurse Note:  Arrives via [facility] c/o high blood sugar.  CBG:  253.  Per report, patient is homeless and not taking medication.  VS wnl.  No complaints.  NAD

## 2021-05-20 DIAGNOSIS — E118 Type 2 diabetes mellitus with unspecified complications: Secondary | ICD-10-CM

## 2021-05-20 LAB — GLUCOSE, POCT (MANUAL RESULT ENTRY): POC Glucose: 360 mg/dl — AB (ref 70–99)

## 2021-05-20 NOTE — Congregational Nurse Program (Signed)
  Dept: [phone]   Congregational Nurse Program Note  Date of Encounter: 05/20/2021 Client to clinic for the first time in a few weeks. Reports he is 'staying" on [place]. Unclear how often. He was in the ED yesterday and stated that he was "on a porch, rubbing his legs when the police came". He stated that then EMS came. He was given fluids in the ED and discharged.he did not have any of his medications with him, reports not taking insulin pen, but taking metformin,not regularly. [facility] at 3:20pm. Support and education regarding keeping his appointment, some diet education attempted. Will continue to provide support.  Past Medical History: Past Medical History:  Diagnosis Date  . Abrasion of toe with infection, right, subsequent encounter 03/27/2021  . CHF (congestive heart failure) ([place])   . Diabetes mellitus without complication ([place])   . Hypertension     Encounter Details:  CNP Questionnaire - 05/20/21 1130      Questionnaire   Do you give verbal consent to treat you today? Yes    Visit Setting Church or Organization    Location Patient Served At [facility]    Patient Status Homeless    Medical Provider No    Insurance Medicaid    Intervention Assess (including screenings);Educate;Refer    Housing/Utilities No permanent housing    Transportation Need transportation assistance    Interpersonal Safety Do not feel physically and emotionally safe where you currently live    Food Have food insecurities    Medication Have medication insecurities    Referrals PCP - other provider

## 2021-05-27 DIAGNOSIS — E118 Type 2 diabetes mellitus with unspecified complications: Secondary | ICD-10-CM

## 2021-05-27 LAB — GLUCOSE, POCT (MANUAL RESULT ENTRY): POC Glucose: 380 mg/dl — AB (ref 70–99)

## 2021-05-27 NOTE — Congregational Nurse Program (Signed)
  Dept: [place] Nurse Program Note  Date of Encounter: 05/27/2021 Client to clinic for VS/CBG check and foot care. He reports he did go to his appointment last week at [facility], he also said he was given prescriptions and got medications at the [facility] pharmacy. Blood sugar 380 at today's visit, unclear as to whether he is actually taking his medications. Foot care provided, no open areas, some pain to the lateral aspect of his right foot, tender to touch, no warmth noted. Area has a hard callus type bump. Will continue to provide support and monitor for complications related to his diabetes.  Past Medical History: Past Medical History:  Diagnosis Date   Abrasion of toe with infection, right, subsequent encounter 03/27/2021   CHF (congestive heart failure) ([place])    Diabetes mellitus without complication ([place])    Hypertension     Encounter Details:

## 2021-06-02 ENCOUNTER — Other Ambulatory Visit: Payer: Self-pay

## 2021-06-02 ENCOUNTER — Emergency Department
Admission: EM | Admit: 2021-06-02 | Discharge: 2021-06-02 | Disposition: A | Discharge status: Home / Self Care | Payer: Medicaid Other | Attending: Emergency Medicine | Admitting: Emergency Medicine

## 2021-06-02 DIAGNOSIS — I5042 Chronic combined systolic (congestive) and diastolic (congestive) heart failure: Secondary | ICD-10-CM | POA: Diagnosis not present

## 2021-06-02 DIAGNOSIS — K047 Periapical abscess without sinus: Secondary | ICD-10-CM

## 2021-06-02 DIAGNOSIS — I10 Essential (primary) hypertension: Secondary | ICD-10-CM | POA: Diagnosis not present

## 2021-06-02 DIAGNOSIS — Z7984 Long term (current) use of oral hypoglycemic drugs: Secondary | ICD-10-CM | POA: Insufficient documentation

## 2021-06-02 DIAGNOSIS — E119 Type 2 diabetes mellitus without complications: Secondary | ICD-10-CM | POA: Insufficient documentation

## 2021-06-02 DIAGNOSIS — F17200 Nicotine dependence, unspecified, uncomplicated: Secondary | ICD-10-CM | POA: Insufficient documentation

## 2021-06-02 DIAGNOSIS — Z79899 Other long term (current) drug therapy: Secondary | ICD-10-CM | POA: Insufficient documentation

## 2021-06-02 DIAGNOSIS — Z794 Long term (current) use of insulin: Secondary | ICD-10-CM | POA: Diagnosis not present

## 2021-06-02 DIAGNOSIS — K0889 Other specified disorders of teeth and supporting structures: Secondary | ICD-10-CM | POA: Diagnosis present

## 2021-06-02 DIAGNOSIS — I2510 Atherosclerotic heart disease of native coronary artery without angina pectoris: Secondary | ICD-10-CM | POA: Diagnosis not present

## 2021-06-02 MED ORDER — CLINDAMYCIN HCL 300 MG PO CAPS: 300.0000 mg | ORAL_CAPSULE | Freq: Three times a day (TID) | ORAL | 0 refills | Status: AC

## 2021-06-02 MED ORDER — ACETAMINOPHEN 500 MG PO TABS
1000.0000 mg | ORAL_TABLET | Freq: Once | ORAL | Status: AC
  Administered 2021-06-02: 1000 mg via ORAL

## 2021-06-02 NOTE — ED Notes (Signed)
Called No answer.

## 2021-06-02 NOTE — ED Triage Notes (Signed)
Abscess to left side of face/mouth for the last few days, worse since last pm.  Swelling noted.

## 2021-06-02 NOTE — ED Provider Notes (Signed)
[facility] Emergency Department Provider Note ____________________________________________   Event Date/Time   First MD Initiated Contact with Patient 06/02/21 1244     (approximate)  I have reviewed the triage vital signs and the nursing notes.   HISTORY  Chief Complaint Abscess  HPI Joshua [name] is a 50 y.o. male with history of diabetes, CHF, hypertension, polysubstance abuse and additional complex history as listed below presents to the emergency department for treatment and evaluation of facial swelling and dental pain.  No known fever.  Symptoms started 3 days ago but became much worse overnight.  No alleviating measures attempted prior to arrival..         Past Medical History:  Diagnosis Date   Abrasion of toe with infection, right, subsequent encounter 03/27/2021   CHF (congestive heart failure) ([place])    Diabetes mellitus without complication ([place])    Hypertension     Patient Active Problem List   Diagnosis Date Noted   Acute osteomyelitis of toe, right ([place]) 03/24/2021   Community acquired pneumonia 03/11/2021   CAD (coronary artery disease) 03/11/2021   Sepsis ([place]) 03/11/2021   Chest pain 03/11/2021   HTN (hypertension) 03/11/2021   Type 2 diabetes mellitus with complications ([place]) 87/56/4332   Tobacco abuse 03/11/2021   Polysubstance abuse ([place])    Osteomyelitis of great toe of right foot ([place]) 02/18/2021   Malnutrition of moderate degree 02/14/2021   Cocaine abuse ([place]) 02/13/2021   Homeless 02/13/2021   Cellulitis of great toe of right foot 02/12/2021   Diabetic foot ulcer ([place]) 02/12/2021   Lactic acidosis 95/18/8416   Acute metabolic encephalopathy 60/63/0160   Chronic combined systolic (congestive) and diastolic (congestive) heart failure ([place]) 02/12/2021   SIRS (systemic inflammatory response syndrome) ([place]) 10/04/2020   Acute respiratory failure with hypoxia ([place]) 07/15/2020   Multifocal pneumonia 07/15/2020    History of MI (myocardial infarction) 07/15/2020   Symptomatic anemia 07/15/2020   Acute ST elevation myocardial infarction (STEMI) involving left anterior descending (LAD) coronary artery ([place]) 09/09/2018   STEMI involving left anterior descending coronary artery ([place]) 09/09/2018   Hypoglycemia 06/02/2016    Past Surgical History:  Procedure Laterality Date   AMPUTATION TOE Right 04/01/2021   Procedure: AMPUTATION TOE;  Surgeon: Sharlotte Alamo, DPM;  Location: [facility] ORS;  Service: Podiatry;  Laterality: Right;  RIGHT GREAT TOE   CORONARY/GRAFT ACUTE MI REVASCULARIZATION N/A 09/09/2018   Procedure: Coronary/Graft Acute MI Revascularization;  Surgeon: Yolonda Kida, MD;  Location: [place] [facility];  Service: Cardiovascular;  Laterality: N/A;   LEFT HEART CATH AND CORONARY ANGIOGRAPHY N/A 09/09/2018   Procedure: LEFT HEART CATH AND CORONARY ANGIOGRAPHY;  Surgeon: Yolonda Kida, MD;  Location: [place] [facility];  Service: Cardiovascular;  Laterality: N/A;   LOWER EXTREMITY ANGIOGRAPHY Right 02/14/2021   Procedure: Lower Extremity Angiography;  Surgeon: Katha Cabal, MD;  Location: [place] [facility];  Service: Cardiovascular;  Laterality: Right;   none     STENT PLACEMENT VASCULAR ([facility] HX)      Prior to Admission medications   Medication Sig Start Date End Date Taking? Authorizing Provider  clindamycin (CLEOCIN) 300 MG capsule Take 1 capsule (300 mg total) by mouth 3 (three) times daily for 10 days. 06/02/21 06/12/21 Yes Rosmary Dionisio B, FNP  ascorbic acid (VITAMIN C) 250 MG tablet Take 1 tablet (250 mg total) by mouth 2 (two) times daily. 02/19/21   Lorella Nimrod, MD  carvedilol (COREG) 3.125 MG tablet Take 1 tablet (  3.125 mg total) by mouth 2 (two) times daily with a meal. 02/19/21   Lorella Nimrod, MD  dapagliflozin propanediol (FARXIGA) 5 MG TABS tablet Take 1 tablet (5 mg total) by mouth daily. 02/19/21   Lorella Nimrod, MD  fluticasone (FLONASE) 50 MCG/ACT nasal spray Place 2  sprays into both nostrils daily. 10/05/20   Lavina Hamman, MD  furosemide (LASIX) 20 MG tablet Take 1 tablet (20 mg total) by mouth daily as needed (for weight gain >3lbs in 1 days and >5lbs in 2 days). 02/19/21 02/19/22  Lorella Nimrod, MD  gabapentin (NEURONTIN) 100 MG capsule Take 1 capsule (100 mg total) by mouth at bedtime. 02/19/21   Lorella Nimrod, MD  insulin glargine (LANTUS) 100 UNIT/ML Solostar Pen Inject 40 Units into the skin daily. 02/19/21   Lorella Nimrod, MD  Insulin Pen Needle (PEN NEEDLES) 31G X 5 MM MISC 40 Units by Does not apply route at bedtime. 02/19/21   Lorella Nimrod, MD  losartan (COZAAR) 25 MG tablet Take 1 tablet (25 mg total) by mouth daily. 02/19/21 02/19/22  Lorella Nimrod, MD  metFORMIN (GLUCOPHAGE) 500 MG tablet Take 1 tablet (500 mg total) by mouth daily with breakfast. 02/19/21 04/20/21  Lorella Nimrod, MD  Multiple Vitamin (MULTIVITAMIN WITH MINERALS) TABS tablet Take 1 tablet by mouth daily. 02/19/21   Lorella Nimrod, MD  spironolactone (ALDACTONE) 25 MG tablet Take 1 tablet (25 mg total) by mouth daily. 02/19/21   Lorella Nimrod, MD    Allergies Penicillins  Family History  Problem Relation Age of Onset   Cancer Mother    CAD Brother     Social History Social History   Tobacco Use   Smoking status: Every Day    Pack years: 0.00   Smokeless tobacco: Never  Vaping Use   Vaping Use: Never used  Substance Use Topics   Alcohol use: Yes    Alcohol/week: 0.0 standard drinks   Drug use: Not Currently    Comment: pt states no found unknown white substance in mouth    Review of Systems  Constitutional: No fever/chills Eyes: No visual changes. ENT: No sore throat. Cardiovascular: Denies chest pain. Respiratory: Denies shortness of breath. Gastrointestinal: No abdominal pain.  No nausea, no vomiting.  No diarrhea.  No constipation. Genitourinary: Negative for dysuria. Musculoskeletal: Negative for back pain. Skin: Negative for rash. Neurological: Negative for headaches,  focal weakness or numbness.  ____________________________________________   PHYSICAL EXAM:  VITAL SIGNS: ED Triage Vitals [06/02/21 1050]  Enc Vitals Group     BP 131/82     Pulse Rate (!) 110     Resp (!) 22     Temp 98.6 F (37 C)     Temp Source Oral     SpO2 100 %     Weight 185 lb (83.9 kg)     Height 5\' 10"  (1.778 m)     Head Circumference      Peak Flow      Pain Score 10     Pain Loc      Pain Edu?      Excl. in [place]?     Constitutional: Alert and oriented. Well appearing and in no acute distress. Eyes: Conjunctivae are normal. PERRL. EOMI. Head: Atraumatic. Nose: No congestion/rhinnorhea. Mouth/Throat: Mucous membranes are moist.  Oropharynx non-erythematous. Neck: No stridor.   Hematological/Lymphatic/Immunilogical: No cervical lymphadenopathy. Cardiovascular: Normal rate, regular rhythm. Grossly normal heart sounds.  Good peripheral circulation. Respiratory: Normal respiratory effort.  No retractions. Lungs CTAB. Gastrointestinal:  Soft and nontender. No distention. No abdominal bruits. No CVA tenderness. Genitourinary:  Musculoskeletal: No lower extremity tenderness nor edema.  No joint effusions. Neurologic:  Normal speech and language. No gross focal neurologic deficits are appreciated. No gait instability. Skin:  Skin is warm, dry and intact. No rash noted. Psychiatric: Mood and affect are normal. Speech and behavior are normal.  ____________________________________________   LABS (all labs ordered are listed, but only abnormal results are displayed)  Labs Reviewed - No data to display ____________________________________________  EKG  Not indicated ____________________________________________  RADIOLOGY  ED MD interpretation:    Not indicated I, Sherrie George, personally viewed and evaluated these images (plain radiographs) as part of my medical decision making, as well as reviewing the written report by the radiologist.  Official radiology  report(s): No results found.  ____________________________________________   PROCEDURES  Procedure(s) performed (including Critical Care):  Procedures  ____________________________________________   INITIAL IMPRESSION / ASSESSMENT AND PLAN     50 year old male presenting to the emergency department for treatment and evaluation dental pain and facial swelling.  See HPI for further details.   ED COURSE  Patient will be treated with clindamycin since he is a diabetic and has a penicillin allergy.  He was encouraged to schedule an appointment with a dentist within 2 weeks.  He was encouraged to return to the emergency department if symptoms change, worsen, or for new concerns.    ___________________________________________   FINAL CLINICAL IMPRESSION(S) / ED DIAGNOSES  Final diagnoses:  Dental abscess     ED Discharge Orders          Ordered    clindamycin (CLEOCIN) 300 MG capsule  3 times daily        06/02/21 1259             Jarold Lamont Ospina was evaluated in Emergency Department on 06/02/2021 for the symptoms described in the history of present illness. He was evaluated in the context of the global COVID-19 pandemic, which necessitated consideration that the patient might be at risk for infection with the SARS-CoV-2 virus that causes COVID-19. Institutional protocols and algorithms that pertain to the evaluation of patients at risk for COVID-19 are in a state of rapid change based on information released by regulatory bodies including the CDC and federal and state organizations. These policies and algorithms were followed during the patient's care in the ED.   Note:  This document was prepared using Dragon voice recognition software and may include unintentional dictation errors.    Victorino Dike, FNP 06/02/21 1401    Naaman Plummer, MD 06/02/21 [phone]

## 2021-06-02 NOTE — Discharge Instructions (Addendum)
Please call and schedule a dental appointment as soon as possible. You will need to be seen within the next 14 days. Return to the emergency department for symptoms that change or worsen if you're unable to schedule an appointment.  OPTIONS FOR DENTAL FOLLOW UP CARE  [place] [facility] and [place] [email or web address] [phone])  Joshua [name] [phone])  [place] [phone])  [place] [phone])  [facility] [phone]) This clinic caters to the indigent population and is on a lottery system. Location: [employer] [facility], [place], [place], [place] Clinic Hours: Wednesdays from 6pm - 9pm, patients seen by a lottery system. For dates, call or go to [email or web address] Services: Cleanings, fillings and simple extractions. Payment Options: DENTAL WORK IS FREE OF CHARGE. Bring proof of income or support. Best way to get seen: Arrive at 5:15 pm - this is a lottery, NOT first come/first serve, so arriving earlier will not increase your chances of being seen.     [facility] [phone] Select option 1 for emergencies   Location: [facility], [place], [address] [place] [facility] Hours: No walk-ins accepted - call the day before to schedule an appointment. Check in times are 9:30 am and 1:30 pm. Services: Simple extractions, temporary fillings, pulpectomy/pulp debridement, uncomplicated abscess drainage. Payment Options: PAYMENT IS DUE AT THE TIME OF SERVICE.  Fee is usually $100-200, additional surgical procedures (e.g. abscess drainage) may be extra. Cash, checks, Visa/MasterCard accepted.  Can file Medicaid if patient is covered for dental - patient should call case worker to check. No  discount for [facility] patients. Best way to get seen: MUST call the day before and get onto the schedule. Can usually be seen the next 1-2 days. No walk-ins accepted.     [facility] [phone]   Location: [place], [place] Clinic Hours: M, W, Th, F 8am or 1:30pm, Tues 9a or 1:30 - first come/first served. Services: Simple extractions, temporary fillings, uncomplicated abscess drainage.  You do not need to be an [facility] resident. Payment Options: PAYMENT IS DUE AT THE TIME OF SERVICE. Dental insurance, otherwise sliding scale - bring proof of income or support. Depending on income and treatment needed, cost is usually $50-200. Best way to get seen: Arrive early as it is first come/first served.     [facility] [phone]   Location: [place] [facility] Hours: Mon-Thu 8a-5p Services: Most basic dental services including extractions and fillings. Payment Options: PAYMENT IS DUE AT THE TIME OF SERVICE. Sliding scale, up to 50% off - bring proof if income or support. Medicaid with dental option accepted. Best way to get seen: Call to schedule an appointment, can usually be seen within 2 weeks OR they will try to see walk-ins - show up at [place] or 2p (you may have to wait).     [facility] [place] RESIDENTS ONLY   Location: [facility], [place] [address] [place], [place] [address] Clinic Hours: By appointment only. Monday - Thursday 8am-5pm, Friday 8am-12pm Services: Cleanings, fillings, extractions. Payment Options: PAYMENT IS DUE AT THE TIME OF SERVICE. Cash, Visa or MasterCard. Sliding scale - $30 minimum per service. Best way to get seen: Come in to office, complete packet and make an appointment -  need proof of income or support monies for each household member and proof of [facility]  residence. Usually takes about a month to get in.     [facility] [phone]   Location: [address], [place] Clinic Hours: Walk-in Urgent Care Dental Services are offered Monday-Friday mornings only. The numbers of emergencies accepted daily is limited to the number of providers available. Maximum 15 - Mondays, Wednesdays & Thursdays Maximum 10 - Tuesdays & Fridays Services: You do not need to be a [facility] resident to be seen for a dental emergency. Emergencies are defined as pain, swelling, abnormal bleeding, or dental trauma. Walkins will receive x-rays if needed. NOTE: Dental cleaning is not an emergency. Payment Options: PAYMENT IS DUE AT THE TIME OF SERVICE. Minimum co-pay is $40.00 for uninsured patients. Minimum co-pay is $3.00 for Medicaid with dental coverage. Dental Insurance is accepted and must be presented at time of visit. Medicare does not cover dental. Forms of payment: Cash, credit card, checks. Best way to get seen: If not previously registered with the clinic, walk-in dental registration begins at 7:15 am and is on a first come/first serve basis. If previously registered with the clinic, call to make an appointment.     [facility] [place] ONLY   Location: [address], [place], [place] Clinic Hours: Mon-Thu 10a-2p Services: Extractions only! Payment Options: FREE (donations accepted) - bring proof of income or support Best way to get seen: Call and schedule an appointment OR come at 8am on the 1st Monday of every month (except for holidays) when it is first come/first served.     [facility] [phone]   Location: [place], [place] Clinic Hours: Friday mornings Services, Payment Options, Best way to get seen: Call for info

## 2021-08-15 ENCOUNTER — Ambulatory Visit: Admit: 2021-08-15 | Discharge: 2021-08-16 | Payer: MEDICAID

## 2021-08-15 DIAGNOSIS — I5020 Unspecified systolic (congestive) heart failure: Principal | ICD-10-CM

## 2021-08-15 DIAGNOSIS — E1159 Type 2 diabetes mellitus with other circulatory complications: Principal | ICD-10-CM

## 2021-08-15 DIAGNOSIS — I5042 Chronic combined systolic (congestive) and diastolic (congestive) heart failure: Principal | ICD-10-CM

## 2021-08-15 DIAGNOSIS — I700 Atherosclerosis of aorta: Principal | ICD-10-CM

## 2021-08-15 DIAGNOSIS — I253 Aneurysm of heart: Principal | ICD-10-CM

## 2021-08-15 DIAGNOSIS — I252 Old myocardial infarction: Principal | ICD-10-CM

## 2021-09-12 ENCOUNTER — Ambulatory Visit: Admit: 2021-09-12 | Payer: MEDICAID

## 2021-10-29 ENCOUNTER — Encounter: Payer: Self-pay | Admitting: Anesthesiology

## 2021-10-29 ENCOUNTER — Inpatient Hospital Stay: Payer: Medicaid Other

## 2021-10-29 ENCOUNTER — Other Ambulatory Visit: Payer: Self-pay

## 2021-10-29 ENCOUNTER — Inpatient Hospital Stay (HOSPITAL_COMMUNITY)
Admit: 2021-10-29 | Discharge: 2021-10-29 | Disposition: A | Discharge status: Home / Self Care | Payer: Medicaid Other | Attending: Family Medicine | Admitting: Family Medicine

## 2021-10-29 ENCOUNTER — Inpatient Hospital Stay
Admission: EM | Admit: 2021-10-29 | Discharge: 2021-11-03 | DRG: 071 | Discharge status: Against Medical Advice | Payer: Medicaid Other | Attending: Internal Medicine | Admitting: Internal Medicine

## 2021-10-29 ENCOUNTER — Emergency Department
Admission: EM | Admit: 2021-10-29 | Discharge: 2021-10-29 | Discharge status: Against Medical Advice | Payer: Medicaid Other | Attending: Emergency Medicine | Admitting: Emergency Medicine

## 2021-10-29 ENCOUNTER — Emergency Department: Payer: Medicaid Other

## 2021-10-29 DIAGNOSIS — N179 Acute kidney failure, unspecified: Secondary | ICD-10-CM | POA: Diagnosis present

## 2021-10-29 DIAGNOSIS — K56691 Other complete intestinal obstruction: Secondary | ICD-10-CM | POA: Diagnosis present

## 2021-10-29 DIAGNOSIS — I2510 Atherosclerotic heart disease of native coronary artery without angina pectoris: Secondary | ICD-10-CM | POA: Diagnosis present

## 2021-10-29 DIAGNOSIS — I10 Essential (primary) hypertension: Secondary | ICD-10-CM | POA: Diagnosis not present

## 2021-10-29 DIAGNOSIS — Z20822 Contact with and (suspected) exposure to covid-19: Secondary | ICD-10-CM | POA: Diagnosis present

## 2021-10-29 DIAGNOSIS — I5022 Chronic systolic (congestive) heart failure: Secondary | ICD-10-CM | POA: Diagnosis present

## 2021-10-29 DIAGNOSIS — K56609 Unspecified intestinal obstruction, unspecified as to partial versus complete obstruction: Secondary | ICD-10-CM | POA: Diagnosis not present

## 2021-10-29 DIAGNOSIS — C188 Malignant neoplasm of overlapping sites of colon: Secondary | ICD-10-CM | POA: Diagnosis present

## 2021-10-29 DIAGNOSIS — K6389 Other specified diseases of intestine: Secondary | ICD-10-CM | POA: Diagnosis not present

## 2021-10-29 DIAGNOSIS — I5021 Acute systolic (congestive) heart failure: Secondary | ICD-10-CM

## 2021-10-29 DIAGNOSIS — F1410 Cocaine abuse, uncomplicated: Secondary | ICD-10-CM | POA: Diagnosis present

## 2021-10-29 DIAGNOSIS — Z8249 Family history of ischemic heart disease and other diseases of the circulatory system: Secondary | ICD-10-CM

## 2021-10-29 DIAGNOSIS — Z809 Family history of malignant neoplasm, unspecified: Secondary | ICD-10-CM

## 2021-10-29 DIAGNOSIS — Z5329 Procedure and treatment not carried out because of patient's decision for other reasons: Secondary | ICD-10-CM | POA: Diagnosis present

## 2021-10-29 DIAGNOSIS — D638 Anemia in other chronic diseases classified elsewhere: Secondary | ICD-10-CM | POA: Diagnosis present

## 2021-10-29 DIAGNOSIS — Z7984 Long term (current) use of oral hypoglycemic drugs: Secondary | ICD-10-CM

## 2021-10-29 DIAGNOSIS — E559 Vitamin D deficiency, unspecified: Secondary | ICD-10-CM | POA: Diagnosis present

## 2021-10-29 DIAGNOSIS — E1165 Type 2 diabetes mellitus with hyperglycemia: Secondary | ICD-10-CM | POA: Diagnosis not present

## 2021-10-29 DIAGNOSIS — Z880 Allergy status to penicillin: Secondary | ICD-10-CM | POA: Diagnosis not present

## 2021-10-29 DIAGNOSIS — E871 Hypo-osmolality and hyponatremia: Secondary | ICD-10-CM | POA: Diagnosis present

## 2021-10-29 DIAGNOSIS — I252 Old myocardial infarction: Secondary | ICD-10-CM

## 2021-10-29 DIAGNOSIS — I110 Hypertensive heart disease with heart failure: Secondary | ICD-10-CM | POA: Diagnosis present

## 2021-10-29 DIAGNOSIS — Z79899 Other long term (current) drug therapy: Secondary | ICD-10-CM

## 2021-10-29 DIAGNOSIS — D500 Iron deficiency anemia secondary to blood loss (chronic): Secondary | ICD-10-CM | POA: Diagnosis present

## 2021-10-29 DIAGNOSIS — E119 Type 2 diabetes mellitus without complications: Secondary | ICD-10-CM

## 2021-10-29 DIAGNOSIS — Z794 Long term (current) use of insulin: Secondary | ICD-10-CM

## 2021-10-29 DIAGNOSIS — D72829 Elevated white blood cell count, unspecified: Secondary | ICD-10-CM | POA: Diagnosis present

## 2021-10-29 DIAGNOSIS — Z720 Tobacco use: Secondary | ICD-10-CM | POA: Diagnosis present

## 2021-10-29 DIAGNOSIS — G9341 Metabolic encephalopathy: Secondary | ICD-10-CM | POA: Diagnosis present

## 2021-10-29 DIAGNOSIS — F1910 Other psychoactive substance abuse, uncomplicated: Secondary | ICD-10-CM | POA: Diagnosis not present

## 2021-10-29 DIAGNOSIS — R1030 Lower abdominal pain, unspecified: Secondary | ICD-10-CM | POA: Diagnosis present

## 2021-10-29 DIAGNOSIS — E118 Type 2 diabetes mellitus with unspecified complications: Secondary | ICD-10-CM | POA: Diagnosis present

## 2021-10-29 DIAGNOSIS — F17210 Nicotine dependence, cigarettes, uncomplicated: Secondary | ICD-10-CM | POA: Diagnosis present

## 2021-10-29 LAB — CBC
HCT: 33.6 % — ABNORMAL LOW (ref 39.0–52.0)
Hemoglobin: 9.8 g/dL — ABNORMAL LOW (ref 13.0–17.0)
MCH: 21 pg — ABNORMAL LOW (ref 26.0–34.0)
MCHC: 29.2 g/dL — ABNORMAL LOW (ref 30.0–36.0)
MCV: 72.1 fL — ABNORMAL LOW (ref 80.0–100.0)
Platelets: 389 10*3/uL (ref 150–400)
RBC: 4.66 MIL/uL (ref 4.22–5.81)
RDW: 16.7 % — ABNORMAL HIGH (ref 11.5–15.5)
WBC: 14.2 10*3/uL — ABNORMAL HIGH (ref 4.0–10.5)
nRBC: 0 % (ref 0.0–0.2)

## 2021-10-29 LAB — RESP PANEL BY RT-PCR (FLU A&B, COVID) ARPGX2
Influenza A by PCR: NEGATIVE
Influenza B by PCR: NEGATIVE
SARS Coronavirus 2 by RT PCR: NEGATIVE

## 2021-10-29 LAB — AMMONIA: Ammonia: 28 umol/L (ref 9–35)

## 2021-10-29 LAB — COMPREHENSIVE METABOLIC PANEL
ALT: 8 U/L (ref 0–44)
AST: 16 U/L (ref 15–41)
Albumin: 3.6 g/dL (ref 3.5–5.0)
Alkaline Phosphatase: 100 U/L (ref 38–126)
Anion gap: 10 (ref 5–15)
BUN: 15 mg/dL (ref 6–20)
CO2: 26 mmol/L (ref 22–32)
Calcium: 9.5 mg/dL (ref 8.9–10.3)
Chloride: 95 mmol/L — ABNORMAL LOW (ref 98–111)
Creatinine, Ser: 1.52 mg/dL — ABNORMAL HIGH (ref 0.61–1.24)
GFR, Estimated: 55 mL/min — ABNORMAL LOW (ref >60)
Glucose, Bld: 270 mg/dL — ABNORMAL HIGH (ref 70–99)
Potassium: 4.2 mmol/L (ref 3.5–5.1)
Sodium: 131 mmol/L — ABNORMAL LOW (ref 135–145)
Total Bilirubin: 0.5 mg/dL (ref 0.3–1.2)
Total Protein: 8.6 g/dL — ABNORMAL HIGH (ref 6.5–8.1)

## 2021-10-29 LAB — ETHANOL: Alcohol, Ethyl (B): <10 mg/dL (ref <10)

## 2021-10-29 LAB — CBG MONITORING, ED
Glucose-Capillary: 235 mg/dL — ABNORMAL HIGH (ref 70–99)
Glucose-Capillary: 138 mg/dL — ABNORMAL HIGH (ref 70–99)

## 2021-10-29 LAB — TROPONIN I (HIGH SENSITIVITY)
Troponin I (High Sensitivity): 8 ng/L (ref <18)
Troponin I (High Sensitivity): 7 ng/L (ref <18)

## 2021-10-29 LAB — LIPASE, BLOOD: Lipase: 37 U/L (ref 11–51)

## 2021-10-29 IMAGING — CT CT ABD-PELV W/ CM
2 of 5 series · 15 of 46 positions shown, 17 images · IV contrast (APPLIED)
Comparison: [DATE]

CLINICAL DATA: Abdominal pain

EXAM:
CT ABDOMEN AND PELVIS WITH CONTRAST
TECHNIQUE: Multidetector CT imaging of the abdomen and pelvis was performed
using the standard protocol following bolus administration of
intravenous contrast.
CONTRAST:  100mL OMNIPAQUE IOHEXOL 300 MG/ML  SOLN

[Series 2: axial st · axial · 0.72mm/px · z∈[-1205,-710]mm · 12 of 111 slices shown, 14 images]
[im 6/111  soft-tissue]
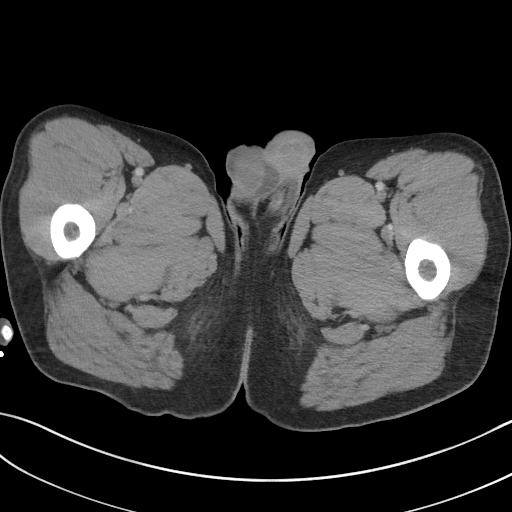
[im 6/111  bone]
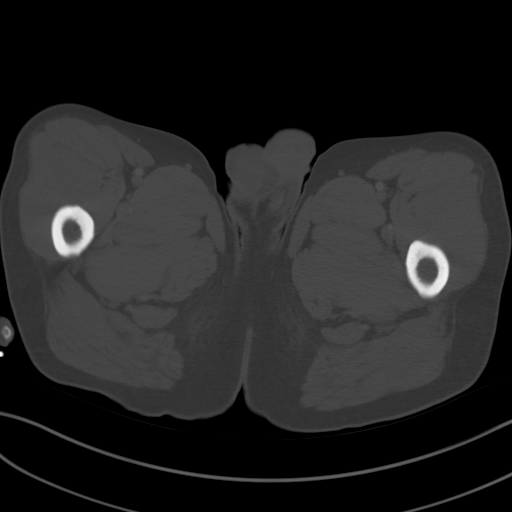
[im 18/111  soft-tissue]
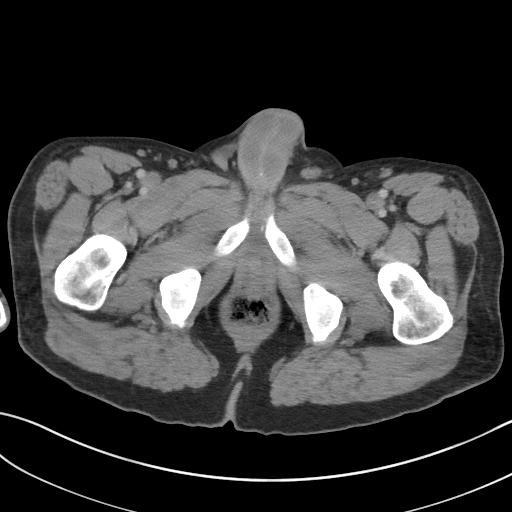
[im 24/111  soft-tissue]
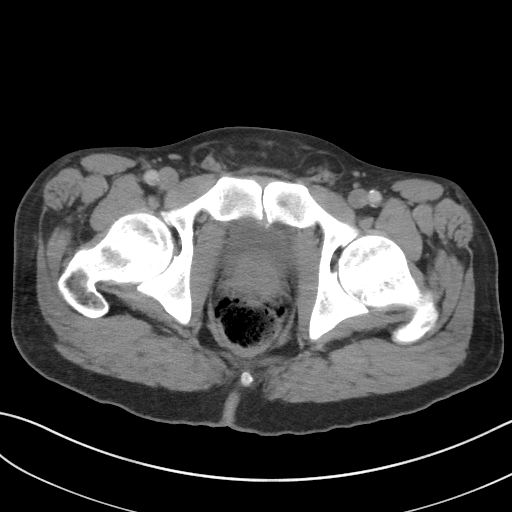
[im 35/111  soft-tissue]
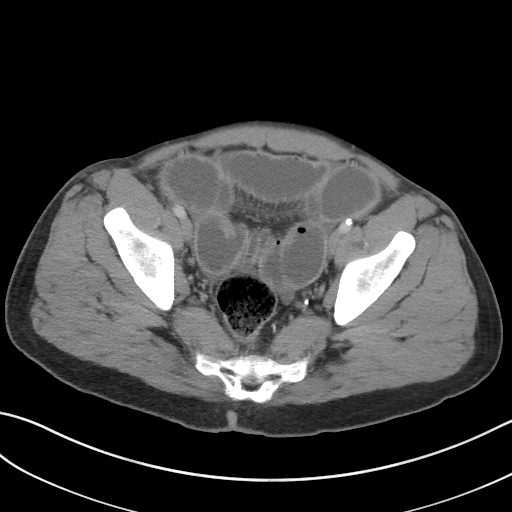
[im 41/111  soft-tissue]
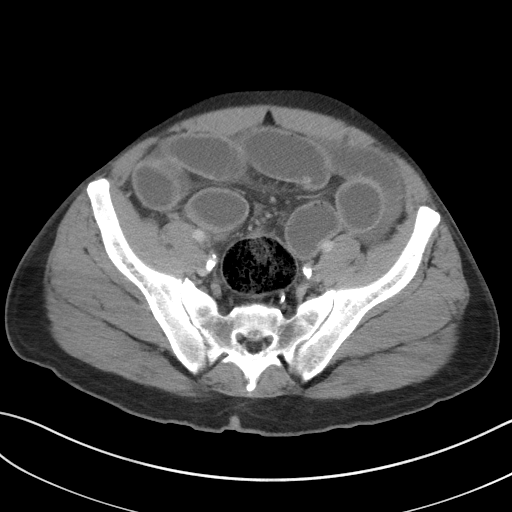
[im 53/111  soft-tissue]
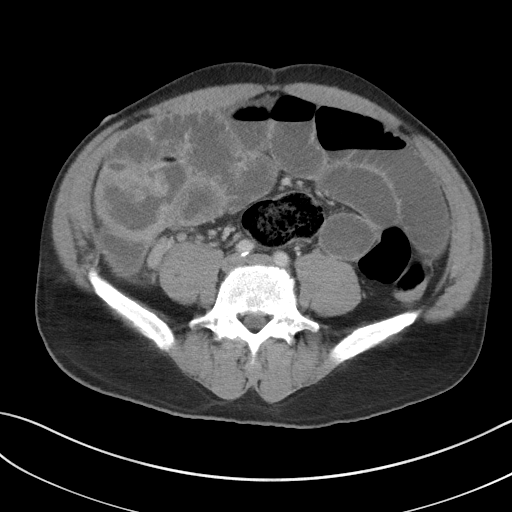
[im 58/111  soft-tissue]
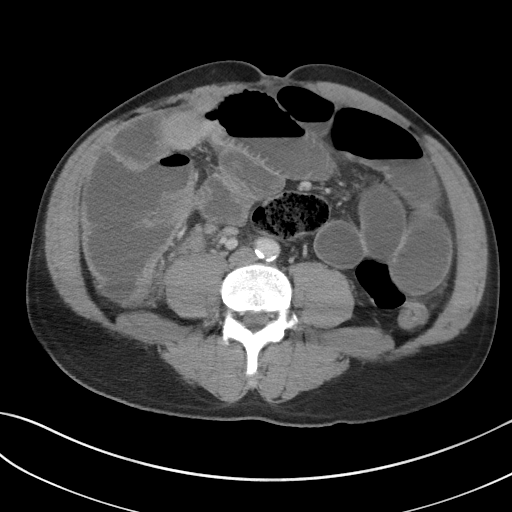
[im 70/111  soft-tissue]
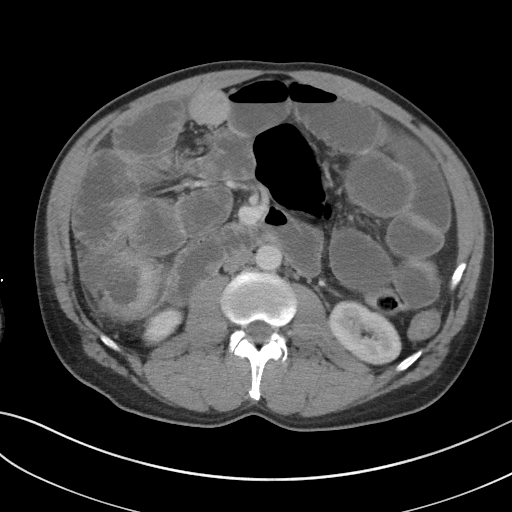
[im 76/111  soft-tissue]
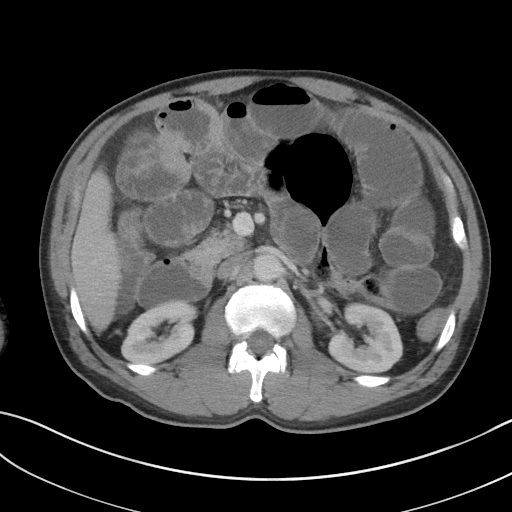
[im 76/111  bone]
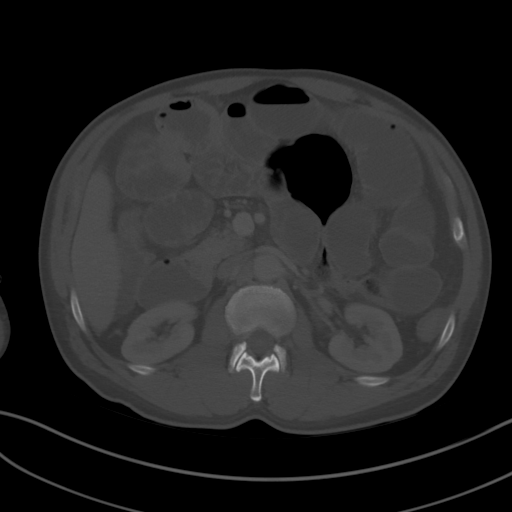
[im 87/111  soft-tissue]
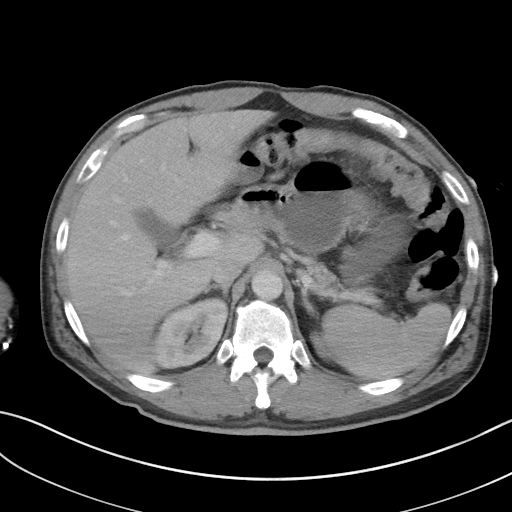
[im 93/111  soft-tissue]
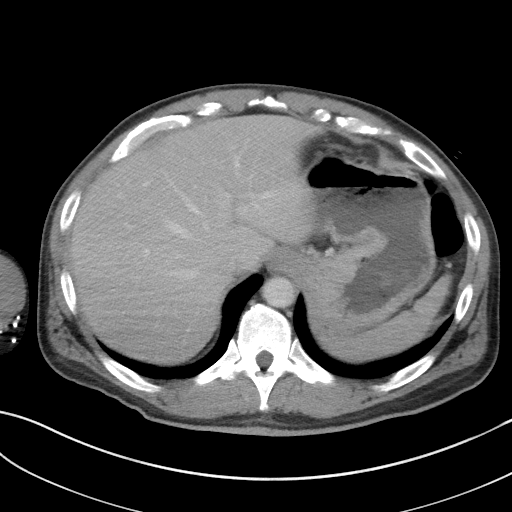
[im 105/111  soft-tissue]
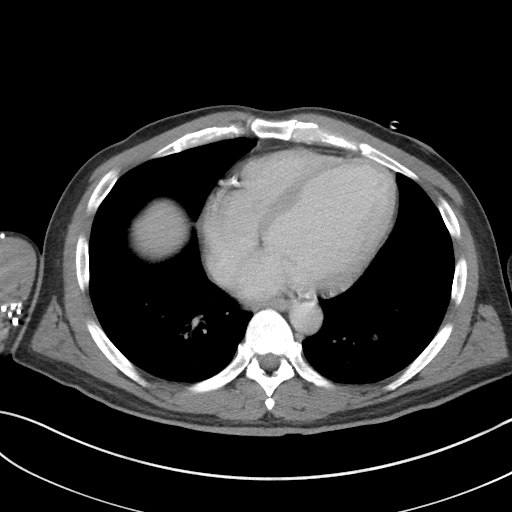

[Series 5: coronal st · coronal · 0.85mm/px · 3 of 92 slices shown]
[im 31/92  soft-tissue]
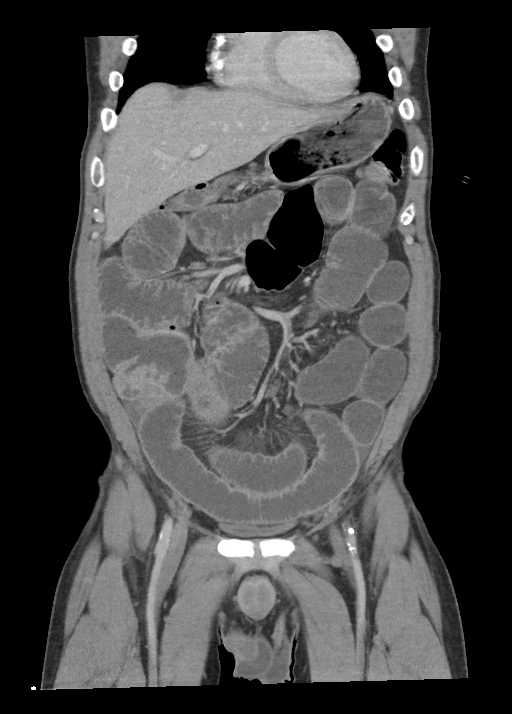
[im 41/92  soft-tissue]
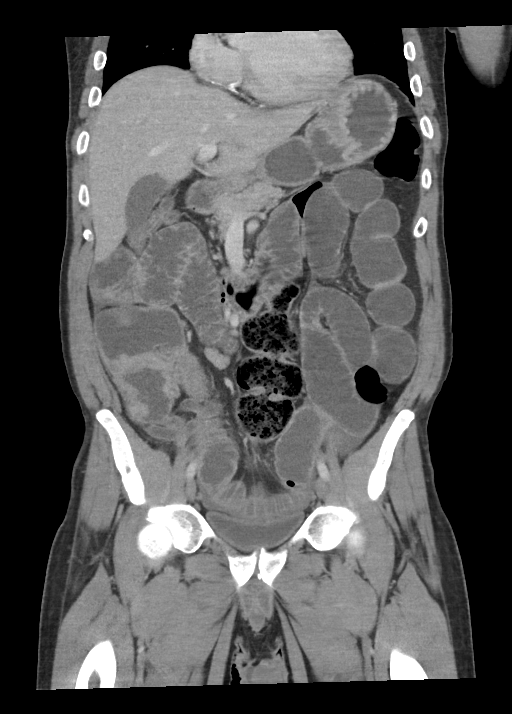
[im 51/92  soft-tissue]
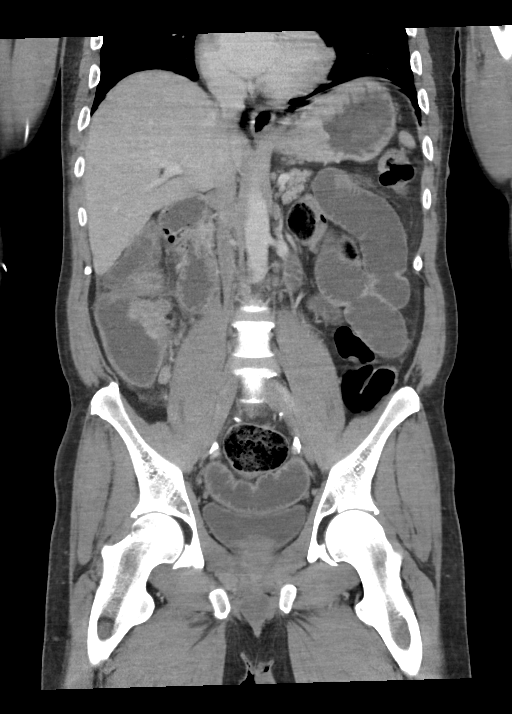

[15 of 46 positions shown; findings below may reference images not displayed]

FINDINGS: Lower chest: Unremarkable.

Hepatobiliary: No suspicious focal abnormality within the liver
parenchyma. There is no evidence for gallstones, gallbladder wall
thickening, or pericholecystic fluid. No intrahepatic or
extrahepatic biliary dilation.

Pancreas: No focal mass lesion. No dilatation of the main duct. No
intraparenchymal cyst. No peripancreatic edema.

Spleen: No splenomegaly. No focal mass lesion.

Adrenals/Urinary Tract: No adrenal nodule or mass. Kidneys
unremarkable. No evidence for hydroureter. The urinary bladder
appears normal for the degree of distention.

Stomach/Bowel: Stomach is unremarkable. No gastric wall thickening.
No evidence of outlet obstruction. Duodenum is normally positioned
as is the ligament of Treitz. Mild duodenal distension. Diffuse
fluids no small bowel dilatation noted from the ligament of Treitz
to the terminal ileum. Cecum is distended and fluid-filled.
Circumferential "apple-core" lesion identified in the ascending
colon about 5-6 cm distal to the ileocecal valve. Colon distal to
this lesion is largely decompressed although there is some gas and
stool in the sigmoid colon and rectum.

Vascular/Lymphatic: There is moderate atherosclerotic calcification
of the abdominal aorta without aneurysm. There is no gastrohepatic
or hepatoduodenal ligament lymphadenopathy. No para-aortic
lymphadenopathy. 14 mm ileocolic lymph node on image 52/2 appears to
have central necrosis. Adjacent 2.1 cm short axis lymph node visible
on image 51/2. Other upper normal to borderline enlarged lymph nodes
are seen in the ileocolic mesentery. No pelvic sidewall
lymphadenopathy.

Reproductive: The prostate gland and seminal vesicles are
unremarkable.

Other: No intraperitoneal free fluid.

Musculoskeletal: No worrisome lytic or sclerotic osseous
abnormality.
IMPRESSION: 1. Obstructing circumferential apple-core lesion measuring
approximately 6 cm in length is identified in the ascending colon,
located about 5-6 cm distal to the ileocecal valve. Imaging features
are consistent with primary colorectal neoplasm. Lymphadenopathy is
evident in the ileocolic mesentery, some of which shows central
necrosis and is highly suspicious for metastatic disease.
2. Diffuse fluid-filled dilated small bowel from the ligament of
Treitz to the terminal ileum measuring up to 3-4 cm diameter.
Imaging features are compatible with obstruction.
3. Aortic Atherosclerosis ([92]-[92]).

I discussed these findings by telephone with Dr. VAUGHN at [92]
hours on [DATE].

## 2021-10-29 IMAGING — CT CT HEAD W/O CM
4 series · 17 of 47 positions shown, 19 images · non-contrast
Comparison: [DATE].

CLINICAL DATA: Altered mental status.  Abdominal pain.

EXAM:
CT HEAD WITHOUT CONTRAST
TECHNIQUE: Contiguous axial images were obtained from the base of the skull
through the vertex without intravenous contrast.

[Series 2: head wo · axial · 0.42mm/px · z∈[-49,+66]mm · 7 of 31 slices shown, 9 images]
[im 4/31  brain]
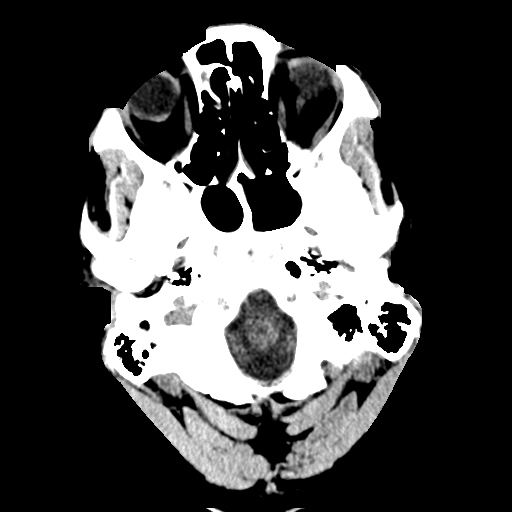
[im 4/31  bone]
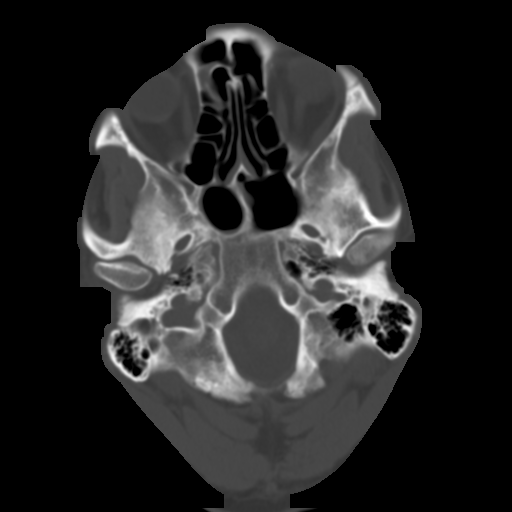
[im 8/31  brain]
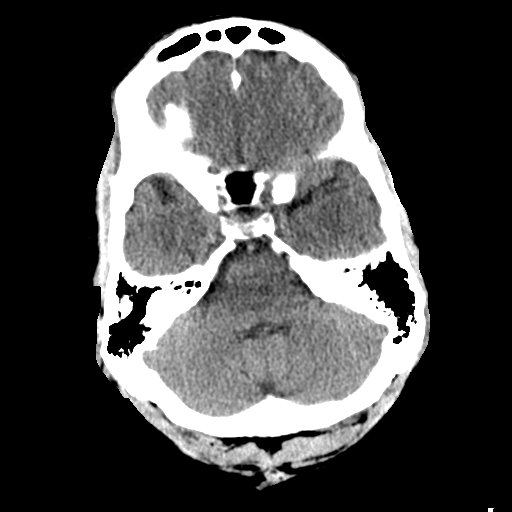
[im 12/31  brain]
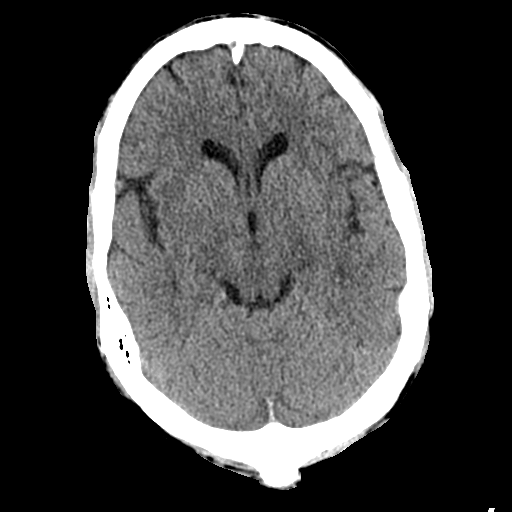
[im 16/31  brain]
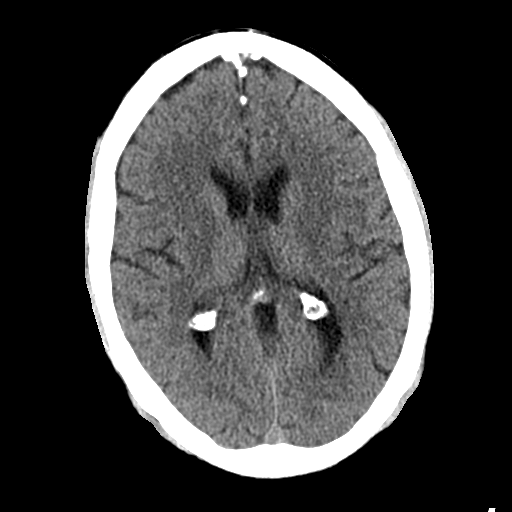
[im 19/31  brain]
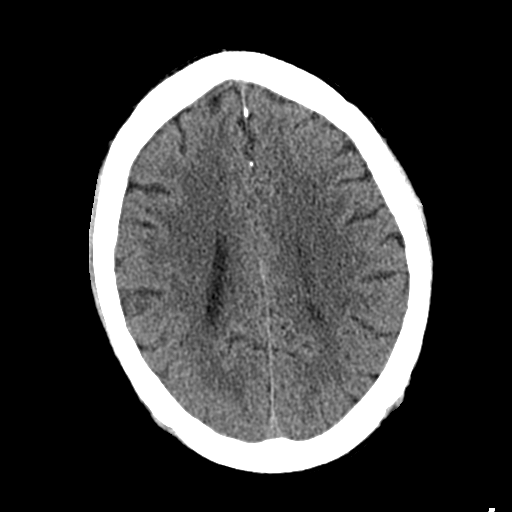
[im 19/31  bone]
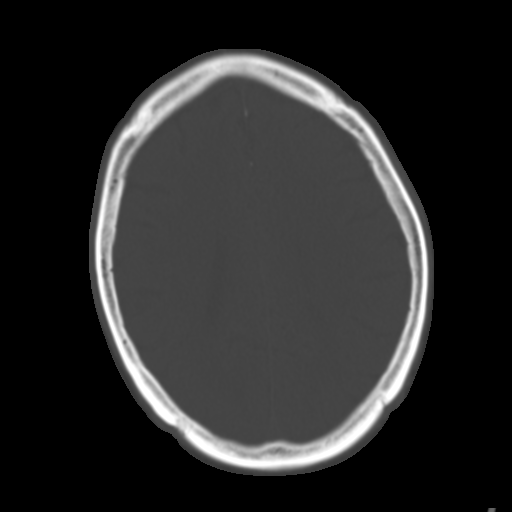
[im 23/31  brain]
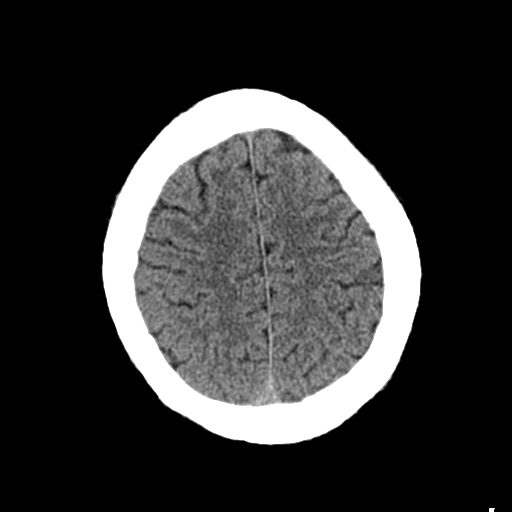
[im 27/31  brain]
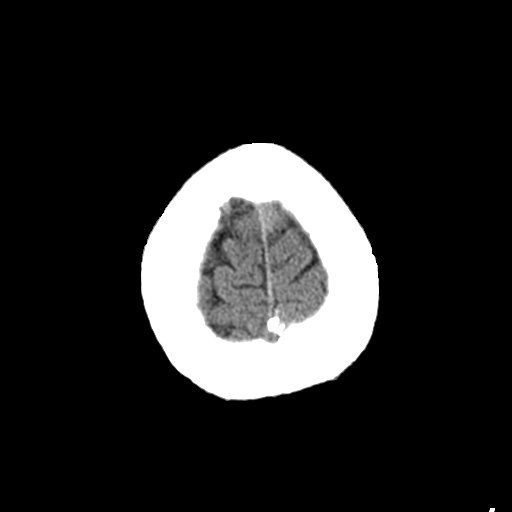

[Series 3: head bone · axial · 0.42mm/px · z∈[-50,+2]mm · 4 of 76 slices shown]
[im 8/76  bone]
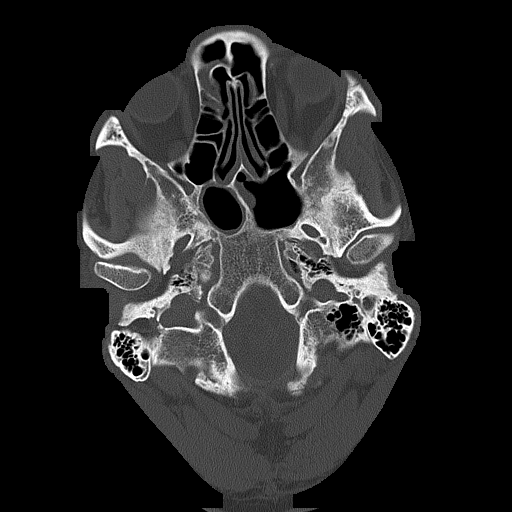
[im 16/76  bone]
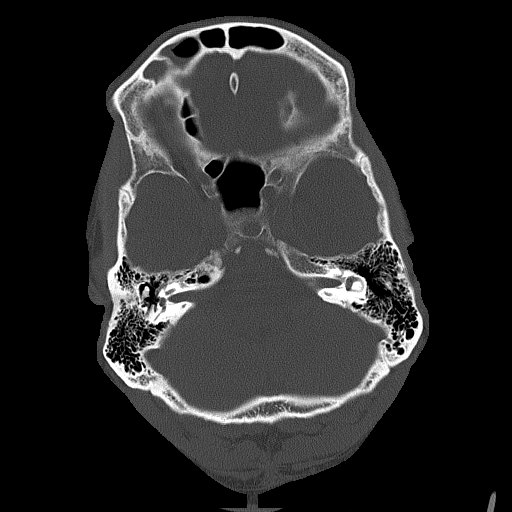
[im 23/76  bone]
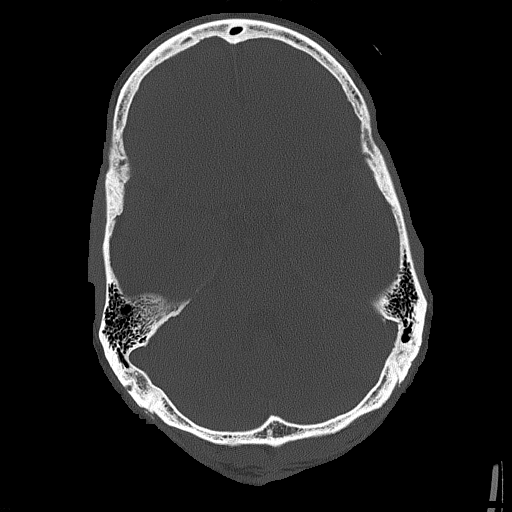
[im 34/76  bone]
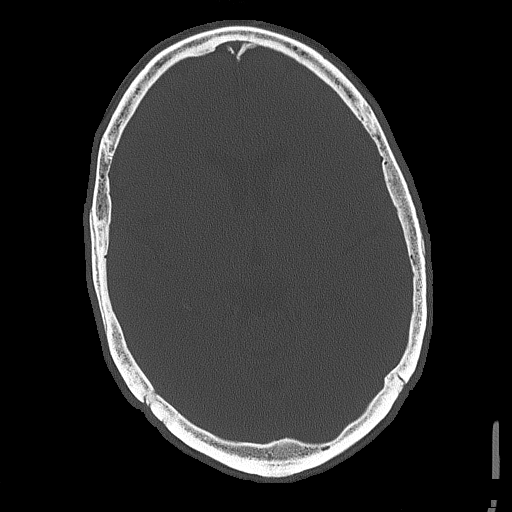

[Series 4: coronal soft tissue · coronal · 0.33mm/px · 3 of 70 slices shown]
[im 24/70  brain]
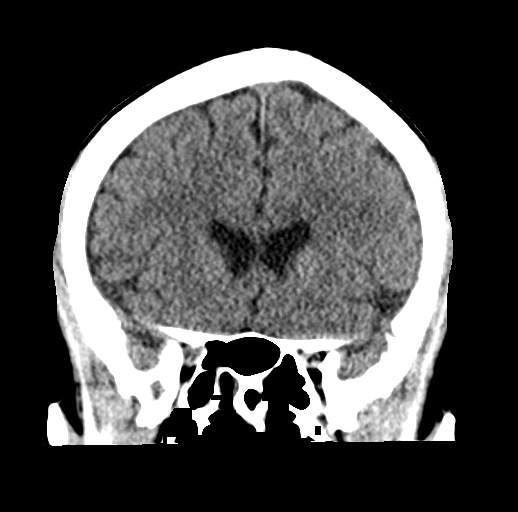
[im 31/70  brain]
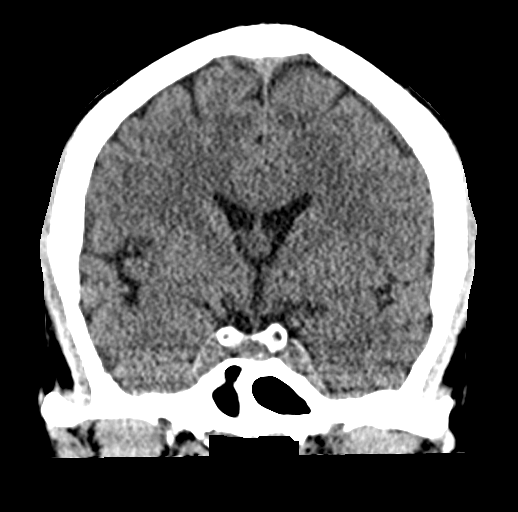
[im 39/70  brain]
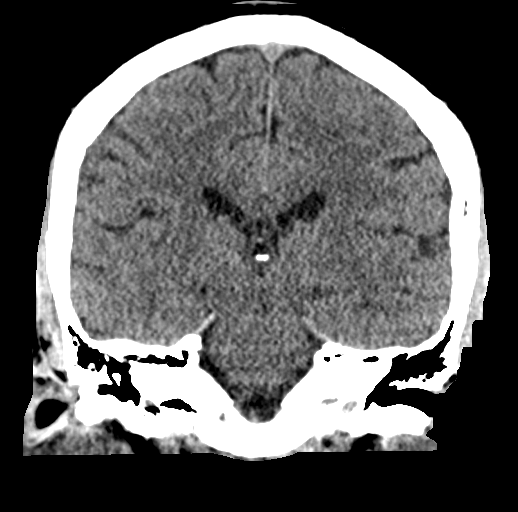

[Series 5: sagittal soft tissue · sagittal · 0.34mm/px · 3 of 55 slices shown]
[im 19/55  brain]
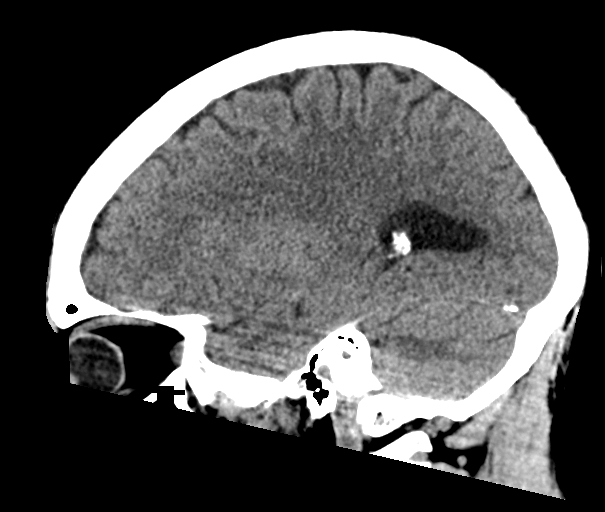
[im 28/55  brain]
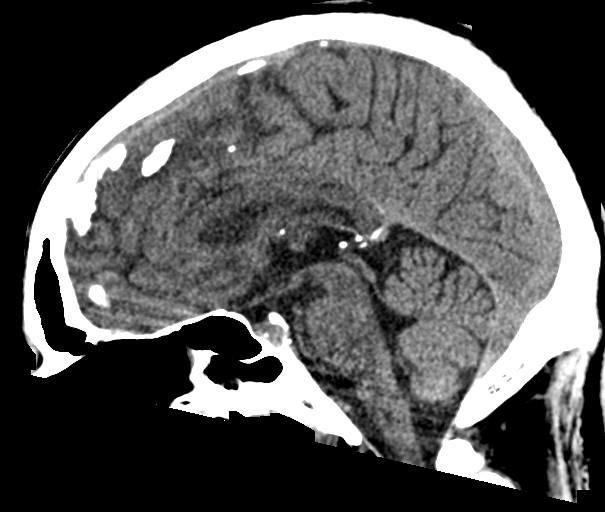
[im 37/55  brain]
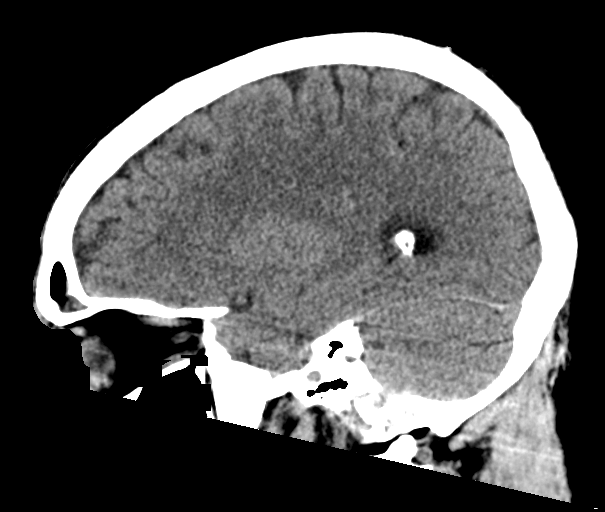

[17 of 47 positions shown; findings below may reference images not displayed]

FINDINGS: Brain: No evidence of acute infarction, hemorrhage, hydrocephalus,
extra-axial collection or mass lesion/mass effect.

Vascular: No hyperdense vessel or unexpected calcification.

Skull: Normal. Negative for fracture or focal lesion.

Sinuses/Orbits: Visualized globes and orbits are unremarkable. Mild
right frontal sinus mucosal thickening with some dependent fluid.
Mild anterior right ethmoid sinus mucosal thickening. Scattered
minor bilateral ethmoid sinus mucosal thickening and sphenoid sinus
mucosal thickening.

Other: None.
IMPRESSION: 1. No intracranial abnormalities.
2. Mild sinus mucosal thickening.

## 2021-10-29 MED ORDER — SODIUM CHLORIDE 0.9 % IV BOLUS
1000.0000 mL | Freq: Once | INTRAVENOUS | Status: AC
  Administered 2021-10-29: 1000 mL via INTRAVENOUS

## 2021-10-29 MED ORDER — INSULIN ASPART 100 UNIT/ML IJ SOLN
0.0000 [IU] | Freq: Three times a day (TID) | INTRAMUSCULAR | Status: DC
  Administered 2021-10-30: 13:00:00 3 [IU] via SUBCUTANEOUS
  Administered 2021-10-30: 11:00:00 2 [IU] via SUBCUTANEOUS
  Administered 2021-10-31: 5 [IU] via SUBCUTANEOUS
  Administered 2021-11-01: 2 [IU] via SUBCUTANEOUS
  Administered 2021-11-02 (×2): 3 [IU] via SUBCUTANEOUS
  Administered 2021-11-02: 1 [IU] via SUBCUTANEOUS
  Administered 2021-11-03: 2 [IU] via SUBCUTANEOUS
  Filled 2021-10-29 (×9): qty 1

## 2021-10-29 MED ORDER — IOHEXOL 300 MG/ML  SOLN
100.0000 mL | Freq: Once | INTRAMUSCULAR | Status: AC | PRN
  Administered 2021-10-29: 100 mL via INTRAVENOUS

## 2021-10-29 MED ORDER — ONDANSETRON HCL 4 MG PO TABS: 4.0000 mg | ORAL_TABLET | Freq: Four times a day (QID) | ORAL | Status: DC | PRN

## 2021-10-29 MED ORDER — CARVEDILOL 6.25 MG PO TABS
3.1250 mg | ORAL_TABLET | Freq: Two times a day (BID) | ORAL | Status: DC
  Administered 2021-10-29 – 2021-11-02 (×8): 3.125 mg via ORAL
  Filled 2021-10-29 (×9): qty 1

## 2021-10-29 MED ORDER — ENOXAPARIN SODIUM 40 MG/0.4ML IJ SOSY
40.0000 mg | PREFILLED_SYRINGE | INTRAMUSCULAR | Status: DC
  Administered 2021-10-29 – 2021-11-01 (×3): 40 mg via SUBCUTANEOUS
  Filled 2021-10-29 (×3): qty 0.4

## 2021-10-29 MED ORDER — FUROSEMIDE 40 MG PO TABS: 20.0000 mg | ORAL_TABLET | Freq: Every day | ORAL | Status: DC | PRN

## 2021-10-29 MED ORDER — DOCUSATE SODIUM 100 MG PO CAPS
100.0000 mg | ORAL_CAPSULE | Freq: Two times a day (BID) | ORAL | Status: DC
  Administered 2021-10-29 – 2021-11-02 (×10): 100 mg via ORAL
  Filled 2021-10-29 (×9): qty 1

## 2021-10-29 MED ORDER — ONDANSETRON HCL 4 MG/2ML IJ SOLN
4.0000 mg | Freq: Once | INTRAMUSCULAR | Status: AC
  Administered 2021-10-29: 4 mg via INTRAVENOUS
  Filled 2021-10-29: qty 2

## 2021-10-29 MED ORDER — SPIRONOLACTONE 25 MG PO TABS
25.0000 mg | ORAL_TABLET | Freq: Every day | ORAL | Status: DC
  Administered 2021-10-30 – 2021-11-02 (×3): 25 mg via ORAL
  Filled 2021-10-29 (×4): qty 1

## 2021-10-29 MED ORDER — NALOXONE HCL 0.4 MG/ML IJ SOLN
0.2000 mg | Freq: Once | INTRAMUSCULAR | Status: AC
  Administered 2021-10-29: 0.2 mg via INTRAVENOUS

## 2021-10-29 MED ORDER — ONDANSETRON HCL 4 MG/2ML IJ SOLN: 4.0000 mg | Freq: Four times a day (QID) | INTRAMUSCULAR | Status: DC | PRN

## 2021-10-29 MED ORDER — PERFLUTREN LIPID MICROSPHERE
1.0000 mL | INTRAVENOUS | Status: DC | PRN
  Administered 2021-10-29: 2 mL via INTRAVENOUS
  Filled 2021-10-29: qty 10

## 2021-10-29 MED ORDER — SODIUM CHLORIDE 0.9 % IV SOLN: INTRAVENOUS | Status: DC

## 2021-10-29 MED ORDER — INSULIN ASPART 100 UNIT/ML IJ SOLN
0.0000 [IU] | Freq: Every day | INTRAMUSCULAR | Status: DC
  Administered 2021-10-30 – 2021-11-02 (×2): 2 [IU] via SUBCUTANEOUS
  Filled 2021-10-29 (×2): qty 1

## 2021-10-29 MED ORDER — ACETAMINOPHEN 325 MG PO TABS: 650.0000 mg | ORAL_TABLET | Freq: Four times a day (QID) | ORAL | Status: DC | PRN

## 2021-10-29 MED ORDER — MORPHINE SULFATE (PF) 4 MG/ML IV SOLN
4.0000 mg | Freq: Once | INTRAVENOUS | Status: AC
Start: 2021-10-29 — End: 2021-10-29
  Administered 2021-10-29: 4 mg via INTRAVENOUS
  Filled 2021-10-29: qty 1

## 2021-10-29 MED ORDER — ACETAMINOPHEN 650 MG RE SUPP
650.0000 mg | Freq: Four times a day (QID) | RECTAL | Status: DC | PRN
  Filled 2021-10-29: qty 1

## 2021-10-29 NOTE — ED Provider Notes (Addendum)
-----------------------------------------   10:10 AM on 10/29/2021 ----------------------------------------- Patient is quite somnolent.  Attempting to consent for surgery, but the patient keeps falling asleep.  Surgery appears to be needed urgently.  He is unable to give [place] any phone numbers of any relatives.  Patient will wake up briefly and then fall back asleep.  Patient does comprehend the gravity of the situation.  We will dose 0.2 mg of Narcan in an attempt to wake the patient up a bit so he can be consented for the surgery which is needed urgently.    Harvest Dark, MD 10/29/21 1012  ----------------------------------------- 2:39 PM on 10/29/2021 ----------------------------------------- Patient continues to be somnolent, unable to adequately understand and consent for his surgery.  They were able to speak to the patient's roommate who states he often is like this where he will be very tired and out of it and not responding to you.  However given his inability to adequately consent to the surgery surgery would like to hold off on his operation for now.  We will admit to the hospitalist service for continued monitoring, altered mental state and surgery consultation for surgery if and when the patient does consent or they can find a family member/POA for the patient.    Harvest Dark, MD 10/29/21 1440

## 2021-10-29 NOTE — Progress Notes (Signed)
Brief Progress Note Consult received and patient seen earlier this morning for what appears to be obstructing proximal ascending colon mass. On examination, patient is incredibly somnolent, briefly arouses to painful stimuli but quickly falls back to sleep. He does not participate much in the history or examination. His abdomen is markedly distended but he does not appear toxic nor peritonitic. I attempted to discuss his likely diagnosis and our recommendation for surgical intervention including exploratory laparotomy, resection, and anastomosis vs ostomy creation. Again, given his somnolence, I am not sure how much he is comprehending especially given the magnitude of the situation. I do not feel he is capable of giving consent nor does it seem like he is agreeable. Myself, Dr Christian Mate, and the ED staff have made multiple attempts at this including using 0.2mg  Narcan without improvement. We have also asked psychiatry to evaluate for competency. Around 1400, the ED staff was able to contact the police who were able to reach the patient's roommate and daughter who were able to corroborate the history. Patient's roommate also states "He will fall asleep like this sometimes and sleep through anything."  Daughter states he has been asking her for Miralax the last few days.   Daughter plans to come to bedside  Will continue to follow up. Full note to follow once formal plan established  -- Edison Simon, PA-C [facility] 10/29/2021, 2:05 PM [phone] M-F: 7am - 4pm

## 2021-10-29 NOTE — H&P (Signed)
History and Physical    Joshua [name] IDP:[id number] DOB: [birth year] DOA: 10/29/2021  PCP: [facility]   Patient coming from: Home   I have personally briefly reviewed patient's old medical records in [place]  Chief Complaint: Abdominal pain associated with nausea and vomiting.  HPI: Joshua [name] is a 50 y.o. male with PMH significant of chronic systolic CHF, diabetes mellitus, hypertension, history of substance abuse, tobacco abuse, presented to the ED with complaints of intermittent abdominal pain for 2 weeks. He describes abdominal pain as epigastric,  radiating towards the back sometimes associated with nausea and vomiting.  Patient reports today abdominal pain was unbearable and he was unable to ambulate.  He has more frequent vomiting,  denies any blood in it.  Patient reports using weed, cocaine and other substances regularly.  Patient was extremely somnolent on arrival in the ED.  He opened eyes on calling his name and then goes back to sleep.  History is obtained from ED chart and daughter at bedside.  She reported patient does have a history of excessive sleeping.  He usually sleeps 12 hours or more. Daughter reports he lives with his partners in apartment.  ED Course: Patient was hemodynamically stable except tachycardic.  Patient was extremely somnolent. Patient was given Narcan, but  Patient continued to remain somnolent. Vitals: Temp 98.9, HR 98, RR 17, BP 112/80, SPO2 98% on room air. Labs include: Sodium 131, potassium 4.2, chloride 95, bicarb 26, glucose 270, BUN 15, creatinine 1.52, calcium 9.5, anion gap 10, alkaline phosphatase 100, albumin 3.6, lipase 37, AST 16, ALT 8, total protein 8.5, ammonia 28, total bilirubin 0.5, troponin 8> 5, WBC 14.2, hemoglobin 9.8, hematocrit 33.6, MCV 72.1, platelet 389, influenza negative, COVID-negative, alcohol level less than 10  CT Abd/P: Obstructing circumferential apple-core lesion measuring  approximately 6 cm in length in the ascending colon, located about 5-6 cm distal to the ileocecal valve. 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 Lymphadenopathy is evident in the ileocolic mesentery, some of which shows central necrosis and is highly suspicious for metastatic disease. Diffuse fluid-filled dilated small bowel from the ligament of Treitz to the terminal ileum measuring up to 3-4 cm diameter. Imaging features are compatible with obstruction.  Review of Systems:  Review of Systems  Constitutional:  Positive for malaise/fatigue.  HENT: Negative.    Eyes: Negative.   Respiratory: Negative.    Cardiovascular: Negative.   Gastrointestinal:  Positive for abdominal pain, constipation, nausea and vomiting.  Genitourinary: Negative.   Musculoskeletal:  Positive for myalgias.  Skin: Negative.   Neurological:  Positive for weakness.  Endo/Heme/Allergies: Negative.   Psychiatric/Behavioral:  Positive for depression and substance abuse. The patient is nervous/anxious.     Past Medical History:  Diagnosis Date   Abrasion of toe with infection, right, subsequent encounter 03/27/2021   CHF (congestive heart failure) ([place])    Diabetes mellitus without complication ([place])    Hypertension     Past Surgical History:  Procedure Laterality Date   AMPUTATION TOE Right 04/01/2021   Procedure: AMPUTATION TOE;  Surgeon: Sharlotte Alamo, DPM;  Location: [facility] OR[facility];  Service: Podiatry;  Laterality: Right;  RIGHT GREAT TOE   CORONARY/GRAFT ACUTE MI REVASCULARIZATION N/A 09/09/2018   Procedure: Coronary/Graft Acute MI Revascularization;  Surgeon: Yolonda Kida, MD;  Location: [place] [facility];  Service: Cardiovascular;  Laterality: N/A;   LEFT HEART CATH AND CORONARY ANGIOGRAPHY N/A 09/09/2018   Procedure: LEFT HEART CATH AND CORONARY ANGIOGRAPHY;  Surgeon: Yolonda Kida, MD;  Location: [place] [facility];  Service: Cardiovascular;  Laterality: N/A;   LOWER EXTREMITY  ANGIOGRAPHY Right 02/14/2021   Procedure: Lower Extremity Angiography;  Surgeon: Katha Cabal, MD;  Location: [place] [facility];  Service: Cardiovascular;  Laterality: Right;   none     STENT PLACEMENT VASCULAR ([place] [facility])       reports that he has been smoking. He has never used smokeless tobacco. He reports current alcohol use. He reports that he does not currently use drugs.  Allergies  Allergen Reactions   Penicillins Other (See Comments)    Patient unsure of allergy    Family History  Problem Relation Age of Onset   Cancer Mother    CAD Brother    Family history reviewed and not pertinent .  Prior to Admission medications   Medication Sig Start Date End Date Taking? Authorizing Provider  ascorbic acid (VITAMIN C) 250 MG tablet Take 1 tablet (250 mg total) by mouth 2 (two) times daily. 02/19/21  Yes Lorella Nimrod, MD  buPROPion (WELLBUTRIN XL) 150 MG 24 hr tablet Take 150 mg by mouth daily as needed. 10/16/21  Yes [provider]  carvedilol (COREG) 3.125 MG tablet Take 1 tablet (3.125 mg total) by mouth 2 (two) times daily with a meal. 02/19/21  Yes Lorella Nimrod, MD  DULoxetine (CYMBALTA) 30 MG capsule Take 30 mg by mouth daily. 10/16/21  Yes [provider]  furosemide (LASIX) 20 MG tablet Take 1 tablet (20 mg total) by mouth daily as needed (for weight gain >3lbs in 1 days and >5lbs in 2 days). 02/19/21 02/19/22 Yes Lorella Nimrod, MD  insulin glargine (LANTUS) 100 UNIT/ML Solostar Pen Inject 40 Units into the skin daily. 02/19/21  Yes Lorella Nimrod, MD  losartan (COZAAR) 25 MG tablet Take 1 tablet (25 mg total) by mouth daily. 02/19/21 02/19/22 Yes Lorella Nimrod, MD  polyethylene glycol powder (GLYCOLAX/MIRALAX) 17 GM/SCOOP powder Take 17 g by mouth daily. 10/16/21  Yes [provider]  spironolactone (ALDACTONE) 25 MG tablet Take 1 tablet (25 mg total) by mouth daily. 02/19/21  Yes Lorella Nimrod, MD  dapagliflozin propanediol (FARXIGA) 5 MG TABS tablet Take 1  tablet (5 mg total) by mouth daily. Patient not taking: Reported on 10/29/2021 02/19/21   Lorella Nimrod, MD  fluticasone [facility]) 50 MCG/ACT nasal spray Place 2 sprays into both nostrils daily. Patient not taking: Reported on 10/29/2021 10/05/20   Lavina Hamman, MD  gabapentin (NEURONTIN) 100 MG capsule Take 1 capsule (100 mg total) by mouth at bedtime. Patient not taking: Reported on 10/29/2021 02/19/21   Lorella Nimrod, MD  Insulin Pen Needle (PEN NEEDLES) 31G X 5 MM MISC 40 Units by Does not apply route at bedtime. 02/19/21   Lorella Nimrod, MD  metFORMIN (GLUCOPHAGE) 500 MG tablet Take 1 tablet (500 mg total) by mouth daily with breakfast. 02/19/21 04/20/21  Lorella Nimrod, MD  Multiple Vitamin (MULTIVITAMIN WITH MINERALS) TABS tablet Take 1 tablet by mouth daily. 02/19/21   Lorella Nimrod, MD    Physical Exam: Vitals:   10/29/21 1000 10/29/21 1047 10/29/21 1212 10/29/21 1344  BP: 113/76 (!) 115/93 118/90 120/77  Pulse: (!) 104 (!) 105 100 99  Resp:  17 18 17   Temp:   98 F (36.7 C) 98.8 F (37.1 C)  TempSrc:   Oral Oral  SpO2: 99% 98% 98% 98%  Weight:      Height:        Constitutional:  Appears comfortable, not in any acute distress.  Patient is alert and oriented, following commands. Vitals:   10/29/21 1000 10/29/21 1047 10/29/21 1212 10/29/21 1344  BP: 113/76 (!) 115/93 118/90 120/77  Pulse: (!) 104 (!) 105 100 99  Resp:  17 18 17   Temp:   98 F (36.7 C) 98.8 F (37.1 C)  TempSrc:   Oral Oral  SpO2: 99% 98% 98% 98%  Weight:      Height:       Eyes: PERRL, lids and conjunctivae normal ENMT: Mucous membranes are dry.  No exudate or erythema..Poor dentition.  Neck: normal, supple, no masses, no thyromegaly Respiratory: Clear to auscultation bilaterally, no wheezing, no crackles, no accessory muscle use.   Cardiovascular: S1-S2 heard, regular rate and rhythm, no murmur. Abdomen: Abdomen is soft, mildly tender ++, nondistended, BS +. musculoskeletal: No edema, no cyanosis, no  clubbing. Good ROM, no contractures. Normal muscle tone.  Skin: no rashes, lesions, ulcers. No induration Neurologic: CN 2-12 grossly intact. Sensation intact, DTR normal. Strength 5/5 in all 4.  Psychiatric: Normal judgment and insight. Alert and oriented x 3. Normal mood.    Labs on Admission: I have personally reviewed following labs and imaging studies  CBC: Recent Labs  Lab 10/29/21 0710  WBC 14.2*  HGB 9.8*  HCT 33.6*  MCV 72.1*  PLT 798   Basic Metabolic Panel: Recent Labs  Lab 10/29/21 0710  NA 131*  K 4.2  CL 95*  CO2 26  GLUCOSE 270*  BUN 15  CREATININE 1.52*  CALCIUM 9.5   GFR: Estimated Creatinine Clearance: 60 mL/min (A) (by C-G formula based on SCr of 1.52 mg/dL (H)). Liver Function Tests: Recent Labs  Lab 10/29/21 0710  AST 16  ALT 8  ALKPHOS 100  BILITOT 0.5  PROT 8.6*  ALBUMIN 3.6   Recent Labs  Lab 10/29/21 0710  LIPASE 37   Recent Labs  Lab 10/29/21 0955  AMMONIA 28   Coagulation Profile: No results for input(s): INR, PROTIME in the last 168 hours. Cardiac Enzymes: No results for input(s): CKTOTAL, CKMB, CKMBINDEX, TROPONINI in the last 168 hours. BNP (last 3 results) No results for input(s): PROBNP in the last 8760 hours. HbA1C: No results for input(s): HGBA1C in the last 72 hours. CBG: Recent Labs  Lab 10/29/21 0639  GLUCAP 235*   Lipid Profile: No results for input(s): CHOL, HDL, LDLCALC, TRIG, CHOLHDL, LDLDIRECT in the last 72 hours. Thyroid Function Tests: No results for input(s): TSH, T4TOTAL, FREET4, T3FREE, THYROIDAB in the last 72 hours. Anemia Panel: No results for input(s): VITAMINB12, FOLATE, FERRITIN, TIBC, IRON, RETICCTPCT in the last 72 hours. Urine analysis:    Component Value Date/Time   COLORURINE YELLOW (A) 03/11/2021 1332   APPEARANCEUR CLEAR (A) 03/11/2021 1332   LABSPEC 1.028 03/11/2021 1332   PHURINE 6.0 03/11/2021 1332   GLUCOSEU >=500 (A) 03/11/2021 1332   HGBUR NEGATIVE 03/11/2021 1332    BILIRUBINUR NEGATIVE 03/11/2021 1332   KETONESUR NEGATIVE 03/11/2021 1332   PROTEINUR NEGATIVE 03/11/2021 1332   NITRITE NEGATIVE 03/11/2021 1332   LEUKOCYTESUR NEGATIVE 03/11/2021 1332    Radiological Exams on Admission: CT ABDOMEN PELVIS W CONTRAST  Result Date: 10/29/2021 CLINICAL DATA:  Abdominal pain EXAM: CT ABDOMEN AND PELVIS WITH CONTRAST TECHNIQUE: Multidetector CT imaging of the abdomen and pelvis was performed using the standard protocol following bolus administration of intravenous contrast. CONTRAST:  1104mL OMNIPAQUE IOHEXOL 300 MG/ML  SOLN COMPARISON:  10/04/2020 FINDINGS: Lower chest: Unremarkable. Hepatobiliary: No suspicious focal  abnormality within the liver parenchyma. There is no evidence for gallstones, gallbladder wall thickening, or pericholecystic fluid. No intrahepatic or extrahepatic biliary dilation. Pancreas: No focal mass lesion. No dilatation of the main duct. No intraparenchymal cyst. No peripancreatic edema. Spleen: No splenomegaly. No focal mass lesion. Adrenals/Urinary Tract: No adrenal nodule or mass. Kidneys unremarkable. No evidence for hydroureter. The urinary bladder appears normal for the degree of distention. Stomach/Bowel: Stomach is unremarkable. No gastric wall thickening. No evidence of outlet obstruction. Duodenum is normally positioned as is the ligament of Treitz. Mild duodenal distension. Diffuse fluids no small bowel dilatation noted from the ligament of Treitz to the terminal ileum. Cecum is distended and fluid-filled. Circumferential "apple-core" lesion identified in the ascending colon about 5-6 cm distal to the ileocecal valve. Colon distal to this lesion is largely decompressed although there is some gas and stool in the sigmoid colon and rectum. Vascular/Lymphatic: There is moderate atherosclerotic calcification of the abdominal aorta without aneurysm. There is no gastrohepatic or hepatoduodenal ligament lymphadenopathy. No para-aortic  lymphadenopathy. 14 mm ileocolic lymph node on image 52/2 appears to have central necrosis. Adjacent 2.1 cm short axis lymph node visible on image 51/2. Other upper normal to borderline enlarged lymph nodes are seen in the ileocolic mesentery. No pelvic sidewall lymphadenopathy. Reproductive: The prostate gland and seminal vesicles are unremarkable. Other: No intraperitoneal free fluid. Musculoskeletal: No worrisome lytic or sclerotic osseous abnormality. IMPRESSION: 1. Obstructing circumferential apple-core lesion measuring approximately 6 cm in length is identified in the ascending colon, located about 5-6 cm distal to the ileocecal valve. 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 Lymphadenopathy is evident in the ileocolic mesentery, some of which shows central necrosis and is highly suspicious for metastatic disease. 2. Diffuse fluid-filled dilated small bowel from the ligament of Treitz to the terminal ileum measuring up to 3-4 cm diameter. Imaging features are compatible with obstruction. 3. Aortic Atherosclerosis (ICD10-I70.0). I discussed these findings by telephone with Dr. Kerman Passey at 0859 hours on 10/29/2021. Electronically Signed   By: Misty Stanley M.D.   On: 10/29/2021 09:02    EKG: Independently reviewed.  Sinus tachycardia.  Assessment/Plan Principal Problem:   SBO (small bowel obstruction) (HCC) Active Problems:   Acute metabolic encephalopathy   HTN (hypertension)   Type 2 diabetes mellitus with complications (HCC)   Tobacco abuse   Polysubstance abuse (HCC)   Abdominal pain sec. to small bowel obstruction from colon cancer; Patient presented with intermittent abdominal pain associated with nausea and vomiting which got worse in last 2 days. CT abdomen and pelvis shows SBO secondary to newly diagnosed colon cancer with mets. Patient reports abdominal pain is improving, refused NG tube. Start clear liquid diet and continue to monitor. Continue IV  hydration.  Adequate pain control with pain meds. General surgery consulted,  planning for surgical intervention on Friday.  Presumed colon cancer with metastasis: CT abdomen shows mass in the ascending colon consistent with colorectal carcinoma. Obtain CEA level, oncology consult in the morning. Needs CT chest, CT head for metastatic work-up.  Somnolence could be secondary to substance abuse. Patient was highly somnolent on arrival, He was given Narcan. Daughter at bedside reports patient does have excessive sleeping, sleeps longer than usual hours. Patient is now alert and awake following commands when I examined the patient. Obtain urine drug screen. He uses cocaine, weed regularly.  Hyponatremia: This could be secondary to decreased p.o. intake due to nausea and vomiting and abdominal pain Continue IV gentle hydration, follow-up a.m. labs  AKI: Suspect prerenal due to nausea and vomiting. Avoid nephrotoxic medications, continue IV hydration. Recheck labs in the morning  Leukocytosis: This could be reactive.  No signs of infection noted. Continue to monitor CBC  Diabetes mellitus type 2: Start regular insulin sliding scale,  Start Lantus 10 units qhs obtain hemoglobin A1c  Hypertension: Continue Coreg,  hold losartan due to AKI   Chronic systolic CHF: Appears compensated, euvolemic Continue Coreg, Hold lasix and losartan. Last LVEF: 25 to 30% 09/02/2020 Obtain 2D echocardiogram.    DVT prophylaxis: Lovenox Code Status: Full code. Family Communication: Daughter at bedside.  Status is: Inpatient  Remains inpatient appropriate because: Newly diagnosed colon cancer causing small bowel obstruction requiring general surgical intervention.  Somnolent could be secondary to substance abuse.  Patient is not medically cleared to be discharged.  Anticipated discharge in few days.   Consults called: General surgery Admission status: Inpatient MedSurg   Shawna Clamp  MD [facility]   If 7PM-7AM, please contact night-coverage   10/29/2021, 3:54 PM

## 2021-10-29 NOTE — Consult Note (Signed)
[facility] Consult   Reason for Consult: Consult regarding capacity in this 50 year old man who presented to the hospital with abdominal symptoms which may require surgery for evaluation Referring Physician: Dwyane Dee Patient Identification: Joshua [name] MRN:  [id number] Principal Diagnosis: SBO (small bowel obstruction) ([place]) Diagnosis:  Principal Problem:   SBO (small bowel obstruction) ([place]) Active Problems:   Acute metabolic encephalopathy   HTN (hypertension)   Type 2 diabetes mellitus with complications ([place])   Tobacco abuse   Polysubstance abuse ([place])   Total Time spent with patient: 1 hour  Subjective:   Harald Homer Miller is a 50 y.o. male patient admitted with patient nonverbal with me.  HPI: Patient seen chart reviewed.  50 year old man came to the emergency room complaining of abdominal discomfort.  Initially uncooperative he walked out of the emergency room during the first assessment but immediately returned.  Eventually cooperated with enough evaluation to have a CT scan.  Appears to have a lesion in his colon judged to be high likelihood for malignancy.  Surgery recommends surgical intervention.  Patient was resistant and unable to give any kind of lucid consent.  Psychiatric treatment team was called and spoke to the patient.  They report to me that he was asking if there was "any other way" to evaluate the lesion but at that time was still not willing to give consent.  I came to see the patient.  Found him unarousable.  I could get him in with some effort to open one time but immediately would close it again and did not vocalize with me.  Review of the chart suggests that those who know him stated that this kind of behavior is typical.  Apparently a daughter was contacted and came by to see him.  The nurse on duty told me that the patient had consented to surgery after speaking with the nurse.  Past Psychiatric History: We know nothing except that he  does have a past history of some substance abuse.  No documented other psychiatric history.  Reading in between the lines it sounds like on a lot of his other presentations to the emergency room he has been uncooperative or inappropriate in his behavior but no evidence of psychiatry ever being contacted here or at other facilities.  Risk to Self:   Risk to Others:   Prior Inpatient Therapy:   Prior Outpatient Therapy:    Past Medical History:  Past Medical History:  Diagnosis Date   Abrasion of toe with infection, right, subsequent encounter 03/27/2021   CHF (congestive heart failure) ([place])    Diabetes mellitus without complication ([place])    Hypertension     Past Surgical History:  Procedure Laterality Date   AMPUTATION TOE Right 04/01/2021   Procedure: AMPUTATION TOE;  Surgeon: Sharlotte Alamo, DPM;  Location: [facility];  Service: Podiatry;  Laterality: Right;  RIGHT GREAT TOE   CORONARY/GRAFT ACUTE MI REVASCULARIZATION N/A 09/09/2018   Procedure: Coronary/Graft Acute MI Revascularization;  Surgeon: Yolonda Kida, MD;  Location: [facility];  Service: Cardiovascular;  Laterality: N/A;   LEFT HEART CATH AND CORONARY ANGIOGRAPHY N/A 09/09/2018   Procedure: LEFT HEART CATH AND CORONARY ANGIOGRAPHY;  Surgeon: Yolonda Kida, MD;  Location: [facility];  Service: Cardiovascular;  Laterality: N/A;   LOWER EXTREMITY ANGIOGRAPHY Right 02/14/2021   Procedure: Lower Extremity Angiography;  Surgeon: Katha Cabal, MD;  Location: [facility];  Service: Cardiovascular;  Laterality: Right;   none  STENT PLACEMENT VASCULAR ([facility] HX)     Family History:  Family History  Problem Relation Age of Onset   Cancer Mother    CAD Brother    Family Psychiatric  History: Unknown Social History:  Social History   Substance and Sexual Activity  Alcohol Use Yes   Alcohol/week: 0.0 standard drinks     Social History   Substance and Sexual Activity  Drug Use Not  Currently   Comment: pt states no found unknown white substance in mouth    Social History   Socioeconomic History   Marital status: Single    Spouse name: Not on file   Number of children: Not on file   Years of education: Not on file   Highest education level: Not on file  Occupational History   Not on file  Tobacco Use   Smoking status: Every Day   Smokeless tobacco: Never  Vaping Use   Vaping Use: Never used  Substance and Sexual Activity   Alcohol use: Yes    Alcohol/week: 0.0 standard drinks   Drug use: Not Currently    Comment: pt states no found unknown white substance in mouth   Sexual activity: Not on file  Other Topics Concern   Not on file  Social History Narrative   Not on file   Social Determinants of Health   Financial Resource Strain: Not on file  Food Insecurity: Not on file  Transportation Needs: Not on file  Physical Activity: Not on file  Stress: Not on file  Social Connections: Not on file   Additional Social History:    Allergies:   Allergies  Allergen Reactions   Penicillins Other (See Comments)    Patient unsure of allergy    Labs:  Results for orders placed or performed during the hospital encounter of 10/29/21 (from the past 48 hour(s))  Lipase, blood     Status: None   Collection Time: 10/29/21  7:10 AM  Result Value Ref Range   Lipase 37 11 - 51 U/L    Comment: Performed at [facility], [address] [place], [place] [address]  Comprehensive metabolic panel     Status: Abnormal   Collection Time: 10/29/21  7:10 AM  Result Value Ref Range   Sodium 131 (L) 135 - 145 mmol/L   Potassium 4.2 3.5 - 5.1 mmol/L   Chloride 95 (L) 98 - 111 mmol/L   CO2 26 22 - 32 mmol/L   Glucose, Bld 270 (H) 70 - 99 mg/dL    Comment: Glucose reference range applies only to samples taken after fasting for at least 8 hours.   BUN 15 6 - 20 mg/dL   Creatinine, Ser 1.52 (H) 0.61 - 1.24 mg/dL   Calcium 9.5 8.9 - 10.3 mg/dL   Total Protein  8.6 (H) 6.5 - 8.1 g/dL   Albumin 3.6 3.5 - 5.0 g/dL   AST 16 15 - 41 U/L   ALT 8 0 - 44 U/L   Alkaline Phosphatase 100 38 - 126 U/L   Total Bilirubin 0.5 0.3 - 1.2 mg/dL   GFR, Estimated 55 (L) >60 mL/min    Comment: (NOTE) Calculated using the CKD-EPI Creatinine Equation (2021)    Anion gap 10 5 - 15    Comment: Performed at [facility], [address] [place], [place] [address]  CBC     Status: Abnormal   Collection Time: 10/29/21  7:10 AM  Result Value Ref Range   WBC 14.2 (  H) 4.0 - 10.5 K/uL   RBC 4.66 4.22 - 5.81 MIL/uL   Hemoglobin 9.8 (L) 13.0 - 17.0 g/dL   HCT 33.6 (L) 39.0 - 52.0 %   MCV 72.1 (L) 80.0 - 100.0 fL   MCH 21.0 (L) 26.0 - 34.0 pg   MCHC 29.2 (L) 30.0 - 36.0 g/dL   RDW 16.7 (H) 11.5 - 15.5 %   Platelets 389 150 - 400 K/uL   nRBC 0.0 0.0 - 0.2 %    Comment: Performed at [facility], [address] [place], [place] [address]  Ethanol     Status: None   Collection Time: 10/29/21  7:10 AM  Result Value Ref Range   Alcohol, Ethyl (B) <10 <10 mg/dL    Comment: (NOTE) Lowest detectable limit for serum alcohol is 10 mg/dL.  For medical purposes only. Performed at [facility], [place], [place] [address]   Troponin I (High Sensitivity)     Status: None   Collection Time: 10/29/21  7:10 AM  Result Value Ref Range   Troponin I (High Sensitivity) 8 <18 ng/L    Comment: (NOTE) Elevated high sensitivity troponin I (hsTnI) values and significant  changes across serial measurements may suggest ACS but many other  chronic and acute conditions are known to elevate hsTnI results.  Refer to the "Links" section for chest pain algorithms and additional  guidance. Performed at [facility], [place],  [address]   Resp Panel by RT-PCR (Flu A&B, Covid) Nasopharyngeal Swab     Status: None   Collection Time: 10/29/21  7:25 AM   Specimen: Nasopharyngeal Swab; Nasopharyngeal(NP) swabs in vial  transport medium  Result Value Ref Range   SARS Coronavirus 2 by RT PCR NEGATIVE NEGATIVE    Comment: (NOTE) SARS-CoV-2 target nucleic acids are NOT DETECTED.  The SARS-CoV-2 RNA is generally detectable in upper respiratory specimens during the acute phase of infection. The lowest concentration of SARS-CoV-2 viral copies this assay can detect is 138 copies/mL. A negative result does not preclude SARS-Cov-2 infection and should not be used as the sole basis for treatment or other patient management decisions. A negative result may occur with  improper specimen collection/handling, submission of specimen other than nasopharyngeal swab, presence of viral mutation(s) within the areas targeted by this assay, and inadequate number of viral copies(<138 copies/mL). A negative result must be combined with clinical observations, patient history, and epidemiological information. The expected result is Negative.  Fact Sheet for Patients:  EntrepreneurPulse.com.au  Fact Sheet for Healthcare Providers:  IncredibleEmployment.be  This test is no t yet approved or cleared by the [place] FDA and  has been authorized for detection and/or diagnosis of SARS-CoV-2 by FDA under an Emergency Use Authorization (EUA). This EUA will remain  in effect (meaning this test can be used) for the duration of the COVID-19 declaration under Section 564(b)(1) of the Act, 21 U.S.C.section 360bbb-3(b)(1), unless the authorization is terminated  or revoked sooner.       Influenza A by PCR NEGATIVE NEGATIVE   Influenza B by PCR NEGATIVE NEGATIVE    Comment: (NOTE) The Xpert Xpress SARS-CoV-2/FLU/RSV plus assay is intended as an aid in the diagnosis of influenza from Nasopharyngeal swab specimens and should not be used as a sole basis for treatment. Nasal washings and aspirates are unacceptable for Xpert Xpress SARS-CoV-2/FLU/RSV testing.  Fact Sheet for  Patients: EntrepreneurPulse.com.au  Fact Sheet for Healthcare Providers: IncredibleEmployment.be  This test is  not yet approved or cleared by the [place] and has been authorized for detection and/or diagnosis of SARS-CoV-2 by FDA under an Emergency Use Authorization (EUA). This EUA will remain in effect (meaning this test can be used) for the duration of the COVID-19 declaration under Section 564(b)(1) of the Act, 21 U.S.C. section 360bbb-3(b)(1), unless the authorization is terminated or revoked.  Performed at [facility], [place], [place] [address]   Troponin I (High Sensitivity)     Status: None   Collection Time: 10/29/21  9:25 AM  Result Value Ref Range   Troponin I (High Sensitivity) 7 <18 ng/L    Comment: (NOTE) Elevated high sensitivity troponin I (hsTnI) values and significant  changes across serial measurements may suggest ACS but many other  chronic and acute conditions are known to elevate hsTnI results.  Refer to the "Links" section for chest pain algorithms and additional  guidance. Performed at [facility], [address] [place], [place] [address]   Ammonia     Status: None   Collection Time: 10/29/21  9:55 AM  Result Value Ref Range   Ammonia 28 9 - 35 umol/L    Comment: Performed at [facility], [address] [place], [place] [address]  CBG monitoring, ED     Status: Abnormal   Collection Time: 10/29/21  4:07 PM  Result Value Ref Range   Glucose-Capillary 138 (H) 70 - 99 mg/dL    Comment: Glucose reference range applies only to samples taken after fasting for at least 8 hours.    Current Facility-Administered Medications  Medication Dose Route Frequency Provider Last Rate Last Admin   0.9 %  sodium chloride infusion   Intravenous Continuous Shawna Clamp, MD 50 mL/hr at 10/29/21 1613 New Bag at 10/29/21 1613   acetaminophen (TYLENOL) tablet 650 mg  650 mg  Oral Q6H PRN Shawna Clamp, MD       Or   acetaminophen (TYLENOL) suppository 650 mg  650 mg Rectal Q6H PRN Shawna Clamp, MD       carvedilol (COREG) tablet 3.125 mg  3.125 mg Oral BID WC Shawna Clamp, MD   3.125 mg at 10/29/21 1612   docusate sodium (COLACE) capsule 100 mg  100 mg Oral BID Shawna Clamp, MD   100 mg at 10/29/21 1523   enoxaparin (LOVENOX) injection 40 mg  40 mg Subcutaneous Q24H Shawna Clamp, MD   40 mg at 10/29/21 1612   insulin aspart (novoLOG) injection 0-5 Units  0-5 Units Subcutaneous QHS Shawna Clamp, MD       insulin aspart (novoLOG) injection 0-9 Units  0-9 Units Subcutaneous TID WC Shawna Clamp, MD       ondansetron [facility]) tablet 4 mg  4 mg Oral Q6H PRN Shawna Clamp, MD       Or   ondansetron [facility]) injection 4 mg  4 mg Intravenous Q6H PRN Shawna Clamp, MD       spironolactone (ALDACTONE) tablet 25 mg  25 mg Oral Daily Shawna Clamp, MD       Current Outpatient Medications  Medication Sig Dispense Refill   ascorbic acid (VITAMIN C) 250 MG tablet Take 1 tablet (250 mg total) by mouth 2 (two) times daily. 60 tablet 1   buPROPion (WELLBUTRIN XL) 150 MG 24 hr tablet Take 150 mg by mouth daily as needed.     carvedilol (COREG) 3.125 MG tablet Take 1 tablet (3.125 mg total) by mouth 2 (two) times daily with  a meal. 60 tablet 1   DULoxetine (CYMBALTA) 30 MG capsule Take 30 mg by mouth daily.     furosemide (LASIX) 20 MG tablet Take 1 tablet (20 mg total) by mouth daily as needed (for weight gain >3lbs in 1 days and >5lbs in 2 days). 30 tablet 1   insulin glargine (LANTUS) 100 UNIT/ML Solostar Pen Inject 40 Units into the skin daily. 15 mL 11   losartan (COZAAR) 25 MG tablet Take 1 tablet (25 mg total) by mouth daily. 30 tablet 1   polyethylene glycol powder (GLYCOLAX/MIRALAX) 17 GM/SCOOP powder Take 17 g by mouth daily.     spironolactone (ALDACTONE) 25 MG tablet Take 1 tablet (25 mg total) by mouth daily. 30 tablet 1   dapagliflozin propanediol  (FARXIGA) 5 MG TABS tablet Take 1 tablet (5 mg total) by mouth daily. (Patient not taking: Reported on 10/29/2021) 30 tablet 1   fluticasone (FLONASE) 50 MCG/ACT nasal spray Place 2 sprays into both nostrils daily. (Patient not taking: Reported on 10/29/2021) 16 g 0   gabapentin (NEURONTIN) 100 MG capsule Take 1 capsule (100 mg total) by mouth at bedtime. (Patient not taking: Reported on 10/29/2021) 30 capsule 1   Insulin Pen Needle (PEN NEEDLES) 31G X 5 MM MISC 40 Units by Does not apply route at bedtime. 100 each 1   metFORMIN (GLUCOPHAGE) 500 MG tablet Take 1 tablet (500 mg total) by mouth daily with breakfast. 30 tablet 1   Multiple Vitamin (MULTIVITAMIN WITH MINERALS) TABS tablet Take 1 tablet by mouth daily. 90 tablet 1    Musculoskeletal: Strength & Muscle Tone: within normal limits Gait & Station: normal Patient leans: N/A            Psychiatric Specialty Exam:  Presentation  General Appearance: No data recorded Eye Contact:No data recorded Speech:No data recorded Speech Volume:No data recorded Handedness:No data recorded  Mood and Affect  Mood:No data recorded Affect:No data recorded  Thought Process  Thought Processes:No data recorded Descriptions of Associations:No data recorded Orientation:No data recorded Thought Content:No data recorded History of Schizophrenia/Schizoaffective disorder:No data recorded Duration of Psychotic Symptoms:No data recorded Hallucinations:No data recorded Ideas of Reference:No data recorded Suicidal Thoughts:No data recorded Homicidal Thoughts:No data recorded  Sensorium  Memory:No data recorded Judgment:No data recorded Insight:No data recorded  Executive Functions  Concentration:No data recorded Attention Span:No data recorded Recall:No data recorded Fund of Knowledge:No data recorded Language:No data recorded  Psychomotor Activity  Psychomotor Activity:No data recorded  Assets  Assets:No data recorded  Sleep   Sleep:No data recorded  Physical Exam: Physical Exam Vitals and nursing note reviewed.  Constitutional:      Appearance: Normal appearance.  HENT:     Head: Normocephalic and atraumatic.     Mouth/Throat:     Pharynx: Oropharynx is clear.  Eyes:     Pupils: Pupils are equal, round, and reactive to light.  Cardiovascular:     Rate and Rhythm: Normal rate and regular rhythm.  Pulmonary:     Effort: Pulmonary effort is normal.     Breath sounds: Normal breath sounds.  Abdominal:     General: Abdomen is flat.     Palpations: Abdomen is soft.  Musculoskeletal:        General: Normal range of motion.  Skin:    General: Skin is warm and dry.  Neurological:     Mental Status: Mental status is at baseline.  Psychiatric:        Attention and Perception: He is inattentive.  Speech: He is noncommunicative.   Review of Systems  Unable to perform ROS: Patient unresponsive  Blood pressure 120/77, pulse 99, temperature 98.8 F (37.1 C), temperature source Oral, resp. rate 17, height 5\' 10"  (1.778 m), weight 83.9 kg, SpO2 98 %. Body mass index is 26.54 kg/m.  Treatment Plan Summary: Plan 50 year old man with unknown past psychiatric history currently impossible to evaluate.  Not clear whether his lack of cooperation is physiologic or just intentionally ignoring providers.  Reports are that he is often like this at home which would probably be most consistent with substance abuse.  Narcan does not seem to be helping so it could be benzodiazepine abuse.  No drug screen or history available.  As to the [place] I was not able to make enough of an evaluation to assess whether he has capacity.  If however the patient lacks capacity the appropriate intervention would be to contact his closest relative starting with his daughter.  If there are other first-degree relatives involved the daughter can help to find them and if not then she should be given the power to decide about medical  interventions such as surgery.  Did not file commitment papers at this time.  If the clinical picture changes such as the patient insisting on leaving or becoming belligerent please recontact psychiatric service as needed.  If he were to abruptly attempt to leave I think it would be valid on the part of the ER physicians to file involuntary commitment papers.  He has clearly been told what the situation is and presumptively is behaving irrationally if he just tries to walk out.  Disposition:  see note  Alethia Berthold, MD 10/29/2021 5:34 PM

## 2021-10-29 NOTE — ED Notes (Signed)
Surgery at bedside.

## 2021-10-29 NOTE — ED Notes (Signed)
Pt to STAT desk diaphoretic, slumped over in w/c, appears uncomfortable; male brings pt in st "he woke up and got out of the shower this way and wanted to come to hospital"; pt taken immed to room 11; charge called to room for assistance; pt requires repeated questioning in order to determine why he is here; he st rt lower abd pain radiating into side and flank accomp by vomiting for several days; assisted onto stretcher
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
## 2021-10-29 NOTE — ED Notes (Signed)
Unable to place NG tube at this time due to pt's lethargic state. MD aware and Surgery.

## 2021-10-29 NOTE — Consult Note (Signed)
[facility] SURGICAL CONSULT NOTE - cpt: [id number]  HISTORY OF PRESENT ILLNESS (HPI):  50 y.o. male presented to [facility] ED today for evaluation of abdominal pain. Patient is incredibly somnolent and will arouse briefly to answer very simple questions but will quickly fall back to sleep and slur his speech. Majority of history obtained through chart review and discussion with members of the medical staff. Upon chart review, appears an unnamed male friend brought patient to the hospital and states he got out of the shower and wanted to come to the hospital. Appears in the waiting room he was diaphoretic and very agitated with staff. Apparently complaining of lower abdominal pain, distension, nausea, and lack of bowel function. Again, I am unable to illicit anything from him in the ED due to this somnolence. Does not appear to have any previous intra-abdominal surgical history based on chart review. Unsure of family history given lack of historian participation. Work up in the ED revealed a leukocytosis with WBC to 14.2K, AKI with sCr - 1.52, hyponatremia to 131. CT Abdomen/Pelvis was concerning for proximal ascending colon mass with resulting obstruction.   Surgery is consulted by emergency medicine physician Dr. Harvest Dark, MD in this context for evaluation and management of ascending colon mass, likely malignant, with resulting obstruction.  At the time of my original interview/history with the patient, he was found to be remarkably somnolent. He would arouse very briefly to verbal and painful stimuli but he quickly falls back to sleep. He was able to give very brief answers, and occasionally slurred speech. He was unable to add much to the history. We made multiple attempts to speak with him at length regarding his diagnosis and recommendation for surgical intervention in this setting. He continued to remain somnolent and I do not feel he appropriately can comprehend the magnitude of the  situation nor give consent to proceed. We did ask psychiatry to see him as well to deem whether or not he was competent to make decisions. Around 1400, ED staff was able to contact the police who reached out to his roommate and daughter and I was able to speak to them via telephone. Patient's daughter states that he had been asking her for Miralax the last few days and that "he thought he had a hernia." Patient's roommate states a friend brought him to the hospital early this morning before he was awake. He also states that "the patient will fall asleep like this from time to time and sleep through anything."     PAST MEDICAL HISTORY (PMH):  Past Medical History:  Diagnosis Date   Abrasion of toe with infection, right, subsequent encounter 03/27/2021   CHF (congestive heart failure) ([place])    Diabetes mellitus without complication ([place])    Hypertension      PAST SURGICAL HISTORY ([place]):  Past Surgical History:  Procedure Laterality Date   AMPUTATION TOE Right 04/01/2021   Procedure: AMPUTATION TOE;  Surgeon: Sharlotte Alamo, DPM;  Location: [facility] ORS;  Service: Podiatry;  Laterality: Right;  RIGHT GREAT TOE   CORONARY/GRAFT ACUTE MI REVASCULARIZATION N/A 09/09/2018   Procedure: Coronary/Graft Acute MI Revascularization;  Surgeon: Yolonda Kida, MD;  Location: [place] [facility];  Service: Cardiovascular;  Laterality: N/A;   LEFT HEART CATH AND CORONARY ANGIOGRAPHY N/A 09/09/2018   Procedure: LEFT HEART CATH AND CORONARY ANGIOGRAPHY;  Surgeon: Yolonda Kida, MD;  Location: [place] [facility];  Service: Cardiovascular;  Laterality: N/A;   LOWER EXTREMITY ANGIOGRAPHY Right 02/14/2021  Procedure: Lower Extremity Angiography;  Surgeon: Katha Cabal, MD;  Location: [place] [facility];  Service: Cardiovascular;  Laterality: Right;   none     STENT PLACEMENT VASCULAR ([facility] HX)       MEDICATIONS:  Prior to Admission medications   Medication Sig Start Date End Date Taking? Authorizing  Provider  ascorbic acid (VITAMIN C) 250 MG tablet Take 1 tablet (250 mg total) by mouth 2 (two) times daily. 02/19/21   Lorella Nimrod, MD  carvedilol (COREG) 3.125 MG tablet Take 1 tablet (3.125 mg total) by mouth 2 (two) times daily with a meal. 02/19/21   Lorella Nimrod, MD  dapagliflozin propanediol (FARXIGA) 5 MG TABS tablet Take 1 tablet (5 mg total) by mouth daily. 02/19/21   Lorella Nimrod, MD  fluticasone (FLONASE) 50 MCG/ACT nasal spray Place 2 sprays into both nostrils daily. 10/05/20   Lavina Hamman, MD  furosemide (LASIX) 20 MG tablet Take 1 tablet (20 mg total) by mouth daily as needed (for weight gain >3lbs in 1 days and >5lbs in 2 days). 02/19/21 02/19/22  Lorella Nimrod, MD  gabapentin (NEURONTIN) 100 MG capsule Take 1 capsule (100 mg total) by mouth at bedtime. 02/19/21   Lorella Nimrod, MD  insulin glargine (LANTUS) 100 UNIT/ML Solostar Pen Inject 40 Units into the skin daily. 02/19/21   Lorella Nimrod, MD  Insulin Pen Needle (PEN NEEDLES) 31G X 5 MM MISC 40 Units by Does not apply route at bedtime. 02/19/21   Lorella Nimrod, MD  losartan (COZAAR) 25 MG tablet Take 1 tablet (25 mg total) by mouth daily. 02/19/21 02/19/22  Lorella Nimrod, MD  metFORMIN (GLUCOPHAGE) 500 MG tablet Take 1 tablet (500 mg total) by mouth daily with breakfast. 02/19/21 04/20/21  Lorella Nimrod, MD  Multiple Vitamin (MULTIVITAMIN WITH MINERALS) TABS tablet Take 1 tablet by mouth daily. 02/19/21   Lorella Nimrod, MD  spironolactone (ALDACTONE) 25 MG tablet Take 1 tablet (25 mg total) by mouth daily. 02/19/21   Lorella Nimrod, MD     ALLERGIES:  Allergies  Allergen Reactions   Penicillins Other (See Comments)    Patient unsure of allergy     SOCIAL HISTORY:  Social History   Socioeconomic History   Marital status: Single    Spouse name: Not on file   Number of children: Not on file   Years of education: Not on file   Highest education level: Not on file  Occupational History   Not on file  Tobacco Use   Smoking status: Every  Day   Smokeless tobacco: Never  Vaping Use   Vaping Use: Never used  Substance and Sexual Activity   Alcohol use: Yes    Alcohol/week: 0.0 standard drinks   Drug use: Not Currently    Comment: pt states no found unknown white substance in mouth   Sexual activity: Not on file  Other Topics Concern   Not on file  Social History Narrative   Not on file   Social Determinants of Health   Financial Resource Strain: Not on file  Food Insecurity: Not on file  Transportation Needs: Not on file  Physical Activity: Not on file  Stress: Not on file  Social Connections: Not on file  Intimate Partner Violence: Not on file     FAMILY HISTORY:  Family History  Problem Relation Age of Onset   Cancer Mother    CAD Brother       REVIEW OF SYSTEMS:  Review of Systems  Unable to  perform ROS: Mental status change  Gastrointestinal:  Positive for abdominal pain and nausea.   VITAL SIGNS:  Temp:  [98 F (36.7 C)] 98 F (36.7 C) (11/16 0713) Pulse Rate:  [103-129] 111 (11/16 0900) Resp:  [24] 24 (11/16 0713) BP: (112-139)/(67-119) 114/67 (11/16 0800) SpO2:  [93 %-99 %] 99 % (11/16 0900) Weight:  [83.9 kg] 83.9 kg (11/16 0710)     Height: 5\' 10"  (177.8 cm) Weight: 83.9 kg BMI (Calculated): 26.54   INTAKE/OUTPUT:  No intake/output data recorded.  PHYSICAL EXAM:  Physical Exam Constitutional:      General: He is not in acute distress.    Appearance: He is well-developed and normal weight. He is not ill-appearing.     Comments: Patient very somnolent in the ED, arouses very briefly, attempts to speak but very slurred speech, does not participate in interview or examination   HENT:     Head: Normocephalic and atraumatic.  Pulmonary:     Effort: Pulmonary effort is normal. No respiratory distress.  Abdominal:     General: There is distension.     Palpations: Abdomen is soft.     Tenderness: There is no abdominal tenderness.     Comments: Abdomen is soft but markedly distended and  tympanic, unable to reliably assess tenderness given somnolence but he does not appear overtly peritonitic   Genitourinary:    Comments: Deferred Skin:    General: Skin is warm and dry.     Coloration: Skin is not jaundiced or pale.  Neurological:     Mental Status: He is confused.     Comments: Unable to reliably assess secondary to somnolence   Psychiatric:     Comments: Unable to reliably assess secondary to somnolence      Labs:  CBC Latest Ref Rng & Units 10/29/2021 05/19/2021 04/05/2021  WBC 4.0 - 10.5 K/uL 14.2(H) 5.2 6.6  Hemoglobin 13.0 - 17.0 g/dL 9.8(L) 11.3(L) 10.2(L)  Hematocrit 39.0 - 52.0 % 33.6(L) 36.2(L) 32.4(L)  Platelets 150 - 400 K/uL 389 279 266   CMP Latest Ref Rng & Units 10/29/2021 05/19/2021 04/05/2021  Glucose 70 - 99 mg/dL 270(H) 236(H) 129(H)  BUN 6 - 20 mg/dL 15 11 13   Creatinine 0.61 - 1.24 mg/dL 1.52(H) 0.93 0.98  Sodium 135 - 145 mmol/L 131(L) 136 138  Potassium 3.5 - 5.1 mmol/L 4.2 4.1 4.0  Chloride 98 - 111 mmol/L 95(L) 106 104  CO2 22 - 32 mmol/L 26 24 26   Calcium 8.9 - 10.3 mg/dL 9.5 9.1 8.6(L)  Total Protein 6.5 - 8.1 g/dL 8.6(H) - -  Total Bilirubin 0.3 - 1.2 mg/dL 0.5 - -  Alkaline Phos 38 - 126 U/L 100 - -  AST 15 - 41 U/L 16 - -  ALT 0 - 44 U/L 8 - -    Imaging studies:   CT Abdomen/Pelvis (10/29/2021) personally reviewed with proximal ascending colon mass, malignant appearing with possible metastasis, resulting bowel obstruction, and radiologist report reviewed below:  IMPRESSION: 1. Obstructing circumferential apple-core lesion measuring approximately 6 cm in length is identified in the ascending colon, located about 5-6 cm distal to the ileocecal valve. 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 Lymphadenopathy is evident in the ileocolic mesentery, some of which shows central necrosis and is highly suspicious for metastatic disease. 2. Diffuse fluid-filled dilated small bowel from the ligament of Treitz to  the terminal ileum measuring up to 3-4 cm diameter. Imaging features are compatible with obstruction. 3. Aortic Atherosclerosis (ICD10-I70.0).  Assessment/Plan: (ICD-10's: K43.89) 50 y.o. male with what appears to be ascending colon mass, likely malignant, with resulting bowel obstruction, complicated by patient's change in mental status.   - I do fell that it is in this patient's best interest to undergo exploratory laparotomy with resection vs ostomy creation. Timing has been a significant challenge. With his somnolence and lack of participation in examination, I do not think that he has complete understanding or the situation nor does he have the capacity to give consent. Fortunately, he is not toxic nor peritonitic currently, and I think we have some time to evaluate his "encephalopathy" and get informed consent. He is pending a psychiatry evaluation to determine his ability to make decisions. His daughter stated that she will attempt to come see her father. Unfortunately, if he is deemed competent, consent will still need to come from the patient himself. We will ask medicine to admit in the setting of encephalopathy. We will tentatively post him for surgery on Friday 11/18, and we can potentially assume primary care in the post-operative period.   - Would recommend we continue NPO for now in the setting of obstruction  - Monitor abdominal examination; on-going bowel function  - Morning KUB to ensure now significant worsening of bowel dilation/perforation  - Check CEA  - Pain control prn; antiemetics prn  - Patient refused NGT in ED  - Mobilization as tolerated  All of the above findings and recommendations were discussed with the patient's family via telephone (daughter, roommate).  Thank you for the opportunity to participate in this patient's care.   -- Edison Simon, PA-C [facility] 10/29/2021, 9:10 AM [phone] M-F: 7am - 4pm

## 2021-10-29 NOTE — ED Triage Notes (Signed)
Pt to ED for lower abd pain, +nausea. C/o constipation, last bm last night.   Pt diaphoretic in triage  Denies ETOH or drug use.   Pt laying on floor in lobby, ambulatory to triage room, laying on bench in triage.

## 2021-10-29 NOTE — ED Notes (Signed)
jamal pavon [phone] patients daughter

## 2021-10-29 NOTE — ED Provider Notes (Signed)
[facility] Emergency Department Provider Note  Time seen: 7:25 AM  I have reviewed the triage vital signs and the nursing notes.   HISTORY  Chief Complaint Abdominal Pain   HPI Joshua [name] is a 50 y.o. male with a past medical history of CHF, diabetes, hypertension, prior MI, history of substance use, presents to the emergency department for abdominal pain.  According to the patient he has been experiencing lower abdominal pain over the past 1 to 2 days along with nausea and vomiting this morning.  Subjective fever this morning.  States he has had slight cough.  Denies any drugs or alcohol.  Patient is somewhat somnolent acting but answers questions appropriately, keeps eyes closed through most of the exam.   Past Medical History:  Diagnosis Date   Abrasion of toe with infection, right, subsequent encounter 03/27/2021   CHF (congestive heart failure) ([place])    Diabetes mellitus without complication ([place])    Hypertension     Patient Active Problem List   Diagnosis Date Noted   Acute osteomyelitis of toe, right ([place]) 03/24/2021   Community acquired pneumonia 03/11/2021   CAD (coronary artery disease) 03/11/2021   Sepsis ([place]) 03/11/2021   Chest pain 03/11/2021   HTN (hypertension) 03/11/2021   Type 2 diabetes mellitus with complications ([place]) 45/80/9983   Tobacco abuse 03/11/2021   Polysubstance abuse ([place])    Osteomyelitis of great toe of right foot ([place]) 02/18/2021   Malnutrition of moderate degree 02/14/2021   Cocaine abuse ([place]) 02/13/2021   Homeless 02/13/2021   Cellulitis of great toe of right foot 02/12/2021   Diabetic foot ulcer ([place]) 02/12/2021   Lactic acidosis 38/25/0539   Acute metabolic encephalopathy 76/73/4193   Chronic combined systolic (congestive) and diastolic (congestive) heart failure ([place]) 02/12/2021   SIRS (systemic inflammatory response syndrome) ([place]) 10/04/2020   Acute respiratory failure with hypoxia ([place])  07/15/2020   Multifocal pneumonia 07/15/2020   History of MI (myocardial infarction) 07/15/2020   Symptomatic anemia 07/15/2020   Acute ST elevation myocardial infarction (STEMI) involving left anterior descending (LAD) coronary artery ([place]) 09/09/2018   STEMI involving left anterior descending coronary artery ([place]) 09/09/2018   Hypoglycemia 06/02/2016    Past Surgical History:  Procedure Laterality Date   AMPUTATION TOE Right 04/01/2021   Procedure: AMPUTATION TOE;  Surgeon: Sharlotte Alamo, DPM;  Location: [facility] ORS;  Service: Podiatry;  Laterality: Right;  RIGHT GREAT TOE   CORONARY/GRAFT ACUTE MI REVASCULARIZATION N/A 09/09/2018   Procedure: Coronary/Graft Acute MI Revascularization;  Surgeon: Yolonda Kida, MD;  Location: [place] [facility];  Service: Cardiovascular;  Laterality: N/A;   LEFT HEART CATH AND CORONARY ANGIOGRAPHY N/A 09/09/2018   Procedure: LEFT HEART CATH AND CORONARY ANGIOGRAPHY;  Surgeon: Yolonda Kida, MD;  Location: [place] [facility];  Service: Cardiovascular;  Laterality: N/A;   LOWER EXTREMITY ANGIOGRAPHY Right 02/14/2021   Procedure: Lower Extremity Angiography;  Surgeon: Katha Cabal, MD;  Location: [place] [facility];  Service: Cardiovascular;  Laterality: Right;   none     STENT PLACEMENT VASCULAR ([facility] HX)      Prior to Admission medications   Medication Sig Start Date End Date Taking? Authorizing Provider  ascorbic acid (VITAMIN C) 250 MG tablet Take 1 tablet (250 mg total) by mouth 2 (two) times daily. 02/19/21   Lorella Nimrod, MD  carvedilol (COREG) 3.125 MG tablet Take 1 tablet (3.125 mg total) by mouth 2 (two) times daily with a meal. 02/19/21   Amin,  Soundra Pilon, MD  dapagliflozin propanediol (FARXIGA) 5 MG TABS tablet Take 1 tablet (5 mg total) by mouth daily. 02/19/21   Lorella Nimrod, MD  fluticasone (FLONASE) 50 MCG/ACT nasal spray Place 2 sprays into both nostrils daily. 10/05/20   Lavina Hamman, MD  furosemide (LASIX) 20 MG tablet Take 1  tablet (20 mg total) by mouth daily as needed (for weight gain >3lbs in 1 days and >5lbs in 2 days). 02/19/21 02/19/22  Lorella Nimrod, MD  gabapentin (NEURONTIN) 100 MG capsule Take 1 capsule (100 mg total) by mouth at bedtime. 02/19/21   Lorella Nimrod, MD  insulin glargine (LANTUS) 100 UNIT/ML Solostar Pen Inject 40 Units into the skin daily. 02/19/21   Lorella Nimrod, MD  Insulin Pen Needle (PEN NEEDLES) 31G X 5 MM MISC 40 Units by Does not apply route at bedtime. 02/19/21   Lorella Nimrod, MD  losartan (COZAAR) 25 MG tablet Take 1 tablet (25 mg total) by mouth daily. 02/19/21 02/19/22  Lorella Nimrod, MD  metFORMIN (GLUCOPHAGE) 500 MG tablet Take 1 tablet (500 mg total) by mouth daily with breakfast. 02/19/21 04/20/21  Lorella Nimrod, MD  Multiple Vitamin (MULTIVITAMIN WITH MINERALS) TABS tablet Take 1 tablet by mouth daily. 02/19/21   Lorella Nimrod, MD  spironolactone (ALDACTONE) 25 MG tablet Take 1 tablet (25 mg total) by mouth daily. 02/19/21   Lorella Nimrod, MD    Allergies  Allergen Reactions   Penicillins Other (See Comments)    Patient unsure of allergy    Family History  Problem Relation Age of Onset   Cancer Mother    CAD Brother     Social History Social History   Tobacco Use   Smoking status: Every Day   Smokeless tobacco: Never  Vaping Use   Vaping Use: Never used  Substance Use Topics   Alcohol use: Yes    Alcohol/week: 0.0 standard drinks   Drug use: Not Currently    Comment: pt states no found unknown white substance in mouth    Review of Systems Constitutional: Subjective fever this morning ENT: Mild congestion Cardiovascular: Negative for chest pain. Respiratory: Negative for shortness of breath.  Slight cough. Gastrointestinal: Moderate lower abdominal pain.  Positive for nausea vomiting this morning.  Negative for diarrhea.  Normal bowel movement last night. Genitourinary: Negative for urinary compaints Musculoskeletal: Negative for musculoskeletal complaints Neurological:  Negative for headache All other ROS negative  ____________________________________________   PHYSICAL EXAM:  VITAL SIGNS: ED Triage Vitals  Enc Vitals Group     BP 10/29/21 0713 112/80     Pulse Rate 10/29/21 0713 (!) 129     Resp 10/29/21 0713 (!) 24     Temp 10/29/21 0713 98 F (36.7 C)     Temp src --      SpO2 10/29/21 0713 94 %     Weight 10/29/21 0710 185 lb (83.9 kg)     Height 10/29/21 0710 5\' 10"  (1.778 m)     Head Circumference --      Peak Flow --      Pain Score 10/29/21 0710 8     Pain Loc --      Pain Edu? --      Excl. in [place]? --     Constitutional: Patient is somewhat fatigued appearing, keeps eyes closed through most of the exam but does answer questions appropriately.  Will follow commands. Eyes: Normal exam ENT      Head: Normocephalic and atraumatic.      Mouth/Throat:  Mucous membranes are moist. Cardiovascular: Regular rhythm rate around 120 bpm.   Respiratory: Normal respiratory effort without tachypnea nor retractions. Breath sounds are clear  Gastrointestinal: Soft, moderate lower abdominal tenderness palpation without rebound guarding or distention, mild upper abdominal tenderness. Musculoskeletal: Nontender with normal range of motion in all extremities. Neurologic:  Normal speech and language. No gross focal neurologic deficits  Skin:  Skin is warm, dry and intact.  Psychiatric: Mood and affect are normal.  ____________________________________________    EKG  EKG viewed and interpreted by myself shows sinus tachycardia at 129 bpm with a narrow QRS, right axis deviation, largely normal intervals with nonspecific ST changes.  ____________________________________________    RADIOLOGY  CT scan shows nonobstructing apple core lesion measuring 6 cm causing an obstruction.  There is also lymphadenopathy some which appears necrotic suspicious for metastatic disease.  ____________________________________________   INITIAL IMPRESSION /  ASSESSMENT AND PLAN / ED COURSE  Pertinent labs & imaging results that were available during my care of the patient were reviewed by me and considered in my medical decision making (see chart for details).   Patient presents to the emergency department for abdominal pain nausea vomiting since last night.  Patient is somewhat tachycardic on arrival around 120 bpm, moderate lower abdominal tenderness with mild upper abdominal tenderness, nonfocal exam.  Does have moderate nasal congestion on exam as well.  Afebrile.  We will check labs likely proceed with CT imaging of the abdomen/pelvis, obtain a COVID swab, treat pain nausea IV hydrate while awaiting results.  Patient agreeable to plan of care.  CT findings unfortunately appear consistent with likely colon cancer leading to obstruction and signs of metastatic spread.  I discussed these findings with the patient, he is going to call a family member or friend to come here with him.  I have also talked to Dr. Christian Mate of surgery will be down shortly to speak to the patient.  Joshua [name] was evaluated in Emergency Department on 10/29/2021 for the symptoms described in the history of present illness. He was evaluated in the context of the global COVID-19 pandemic, which necessitated consideration that the patient might be at risk for infection with the SARS-CoV-2 virus that causes COVID-19. Institutional protocols and algorithms that pertain to the evaluation of patients at risk for COVID-19 are in a state of rapid change based on information released by regulatory bodies including the CDC and federal and state organizations. These policies and algorithms were followed during the patient's care in the ED.  ____________________________________________   FINAL CLINICAL IMPRESSION(S) / ED DIAGNOSES  Abdominal pain Nausea vomiting   Harvest Dark, MD 10/29/21 [phone]

## 2021-10-30 ENCOUNTER — Inpatient Hospital Stay: Payer: Medicaid Other

## 2021-10-30 DIAGNOSIS — K56609 Unspecified intestinal obstruction, unspecified as to partial versus complete obstruction: Secondary | ICD-10-CM | POA: Diagnosis not present

## 2021-10-30 DIAGNOSIS — D500 Iron deficiency anemia secondary to blood loss (chronic): Secondary | ICD-10-CM

## 2021-10-30 LAB — COMPREHENSIVE METABOLIC PANEL
ALT: 6 U/L (ref 0–44)
AST: 10 U/L — ABNORMAL LOW (ref 15–41)
Albumin: 2.6 g/dL — ABNORMAL LOW (ref 3.5–5.0)
Alkaline Phosphatase: 71 U/L (ref 38–126)
Anion gap: 9 (ref 5–15)
BUN: 13 mg/dL (ref 6–20)
CO2: 24 mmol/L (ref 22–32)
Calcium: 7.7 mg/dL — ABNORMAL LOW (ref 8.9–10.3)
Chloride: 100 mmol/L (ref 98–111)
Creatinine, Ser: 1.04 mg/dL (ref 0.61–1.24)
GFR, Estimated: >60 mL/min (ref >60)
Glucose, Bld: 176 mg/dL — ABNORMAL HIGH (ref 70–99)
Potassium: 4 mmol/L (ref 3.5–5.1)
Sodium: 133 mmol/L — ABNORMAL LOW (ref 135–145)
Total Bilirubin: 0.5 mg/dL (ref 0.3–1.2)
Total Protein: 6.5 g/dL (ref 6.5–8.1)

## 2021-10-30 LAB — GLUCOSE, CAPILLARY
Glucose-Capillary: 120 mg/dL — ABNORMAL HIGH (ref 70–99)
Glucose-Capillary: 247 mg/dL — ABNORMAL HIGH (ref 70–99)

## 2021-10-30 LAB — CBG MONITORING, ED
Glucose-Capillary: 191 mg/dL — ABNORMAL HIGH (ref 70–99)
Glucose-Capillary: 217 mg/dL — ABNORMAL HIGH (ref 70–99)

## 2021-10-30 LAB — URINALYSIS, ROUTINE W REFLEX MICROSCOPIC
Bilirubin Urine: NEGATIVE
Glucose, UA: NEGATIVE mg/dL
Hgb urine dipstick: NEGATIVE
Ketones, ur: 5 mg/dL — AB
Leukocytes,Ua: NEGATIVE
Nitrite: NEGATIVE
Protein, ur: 30 mg/dL — AB
Specific Gravity, Urine: 1.029 (ref 1.005–1.030)
pH: 5 (ref 5.0–8.0)

## 2021-10-30 LAB — CBC
HCT: 25.7 % — ABNORMAL LOW (ref 39.0–52.0)
Hemoglobin: 7.5 g/dL — ABNORMAL LOW (ref 13.0–17.0)
MCH: 20.9 pg — ABNORMAL LOW (ref 26.0–34.0)
MCHC: 29.2 g/dL — ABNORMAL LOW (ref 30.0–36.0)
MCV: 71.8 fL — ABNORMAL LOW (ref 80.0–100.0)
Platelets: 276 10*3/uL (ref 150–400)
RBC: 3.58 MIL/uL — ABNORMAL LOW (ref 4.22–5.81)
RDW: 16.4 % — ABNORMAL HIGH (ref 11.5–15.5)
WBC: 5.9 10*3/uL (ref 4.0–10.5)
nRBC: 0 % (ref 0.0–0.2)

## 2021-10-30 LAB — ECHOCARDIOGRAM COMPLETE
Area-P 1/2: 3.42 cm2
Height: 70 in
S' Lateral: 4.3 cm
Weight: 2960 oz

## 2021-10-30 LAB — VITAMIN D 25 HYDROXY (VIT D DEFICIENCY, FRACTURES): Vit D, 25-Hydroxy: 8.25 ng/mL — ABNORMAL LOW (ref 30–100)

## 2021-10-30 LAB — HEMOGLOBIN A1C
Hgb A1c MFr Bld: 8.5 % — ABNORMAL HIGH (ref 4.8–5.6)
Mean Plasma Glucose: 197.25 mg/dL

## 2021-10-30 LAB — URINE DRUG SCREEN, QUALITATIVE (ARMC ONLY)
Amphetamines, Ur Screen: POSITIVE — AB
Barbiturates, Ur Screen: NOT DETECTED
Benzodiazepine, Ur Scrn: NOT DETECTED
Cannabinoid 50 Ng, Ur ~~LOC~~: POSITIVE — AB
Cocaine Metabolite,Ur ~~LOC~~: POSITIVE — AB
MDMA (Ecstasy)Ur Screen: NOT DETECTED
Methadone Scn, Ur: NOT DETECTED
Opiate, Ur Screen: POSITIVE — AB
Phencyclidine (PCP) Ur S: NOT DETECTED
Tricyclic, Ur Screen: NOT DETECTED

## 2021-10-30 LAB — PHOSPHORUS: Phosphorus: 3.4 mg/dL (ref 2.5–4.6)

## 2021-10-30 LAB — MAGNESIUM: Magnesium: 2 mg/dL (ref 1.7–2.4)

## 2021-10-30 LAB — IRON AND TIBC
Iron: 16 ug/dL — ABNORMAL LOW (ref 45–182)
Saturation Ratios: 4 % — ABNORMAL LOW (ref 17.9–39.5)
TIBC: 374 ug/dL (ref 250–450)
UIBC: 358 ug/dL

## 2021-10-30 LAB — FERRITIN: Ferritin: 7 ng/mL — ABNORMAL LOW (ref 24–336)

## 2021-10-30 LAB — VITAMIN B12: Vitamin B-12: 331 pg/mL (ref 180–914)

## 2021-10-30 LAB — CEA: CEA: 11.1 ng/mL — ABNORMAL HIGH (ref 0.0–4.7)

## 2021-10-30 MED ORDER — HYDRALAZINE HCL 20 MG/ML IJ SOLN: 10.0000 mg | INTRAMUSCULAR | Status: DC | PRN

## 2021-10-30 MED ORDER — SENNOSIDES-DOCUSATE SODIUM 8.6-50 MG PO TABS: 1.0000 | ORAL_TABLET | Freq: Every evening | ORAL | Status: DC | PRN

## 2021-10-30 MED ORDER — IPRATROPIUM-ALBUTEROL 0.5-2.5 (3) MG/3ML IN SOLN: 3.0000 mL | RESPIRATORY_TRACT | Status: DC | PRN

## 2021-10-30 MED ORDER — TRAZODONE HCL 50 MG PO TABS: 50.0000 mg | ORAL_TABLET | Freq: Every evening | ORAL | Status: DC | PRN

## 2021-10-30 MED ORDER — METOPROLOL TARTRATE 5 MG/5ML IV SOLN: 5.0000 mg | INTRAVENOUS | Status: DC | PRN

## 2021-10-30 MED ORDER — GUAIFENESIN 100 MG/5ML PO LIQD
5.0000 mL | ORAL | Status: DC | PRN
  Filled 2021-10-30: qty 5

## 2021-10-30 MED ORDER — DEXTROSE-NACL 5-0.45 % IV SOLN: INTRAVENOUS | Status: AC

## 2021-10-30 MED ORDER — FUROSEMIDE 10 MG/ML IJ SOLN
20.0000 mg | Freq: Two times a day (BID) | INTRAMUSCULAR | Status: DC
  Administered 2021-10-30 – 2021-11-03 (×6): 20 mg via INTRAVENOUS
  Filled 2021-10-30 (×7): qty 4

## 2021-10-30 MED ORDER — ENALAPRIL MALEATE 5 MG PO TABS
5.0000 mg | ORAL_TABLET | Freq: Every day | ORAL | Status: DC
  Administered 2021-10-31 – 2021-11-02 (×2): 5 mg via ORAL
  Filled 2021-10-30 (×5): qty 1

## 2021-10-30 MED ORDER — SODIUM CHLORIDE 0.9 % IV SOLN
200.0000 mg | Freq: Once | INTRAVENOUS | Status: AC
  Administered 2021-10-30: 200 mg via INTRAVENOUS
  Filled 2021-10-30: qty 200

## 2021-10-30 MED ORDER — POLYETHYLENE GLYCOL 3350 17 GM/SCOOP PO POWD
1.0000 | Freq: Once | ORAL | Status: AC
  Administered 2021-10-30: 14:00:00 255 g via ORAL
  Filled 2021-10-30: qty 255

## 2021-10-30 NOTE — Progress Notes (Signed)
[facility] SURGICAL PROGRESS NOTE (cpt [phone])  Hospital Day(s): 1.   Interval History: Patient seen and examined, no acute events or new complaints overnight. Patient is much more alert and interactive this morning. He remains hesitant about the whole situation which is reasonable. I again spent a lot of time with his this morning. He denied any abdominal pain, nausea, or emesis. He remains distended. His leukocytosis is resolved now; WBC 5.9K. Hgb down to 7.5; suspect there is significant dilutional component as there is a drop across all cell lines. Previous AKI has resolved; sCr - 1.04: UO - unmeasured. Mild hyponatremia to 133 otherwise no significant electrolyte derangements. CEA is elevated at 11.1. He wsa started on CLD and seems to be tolerating this well.   Again, our goal was to attempt to proceed with surgical intervention for this gentlemen yesterday (11/16); however, his change in mental status and unwillingness to participate in discussion made consenting him impossible. He was seen by psychiatry but felt they could not evaluate him adequately enough to determine his level of competency. His daughter eventually came to the ED and I was able to have a reasonable discussion with them regarding his diagnosis and plan for surgical intervention. He seems to be agreeable. We will plan on proceeding tomorrow morning (11/18).     Review of Systems:  Constitutional: denies fever, chills  HEENT: denies cough or congestion  Respiratory: denies any shortness of breath  Cardiovascular: denies chest pain or palpitations  Gastrointestinal: +distension, denies abdominal pain, N/V Genitourinary: denies burning with urination or urinary frequency Musculoskeletal: denies pain, decreased motor or sensation  Vital signs in last 24 hours: [min-max] current  Temp:  [98 F (36.7 C)-98.8 F (37.1 C)] 98.2 F (36.8 C) (11/17 0430) Pulse Rate:  [85-111] 92 (11/17 0430) Resp:  [17-20] 18  (11/17 0430) BP: (111-121)/(67-94) 113/77 (11/17 0430) SpO2:  [93 %-100 %] 98 % (11/17 0430)     Height: 5\' 10"  (177.8 cm) Weight: 83.9 kg BMI (Calculated): 26.54   Intake/Output last 2 shifts:  11/16 0701 - 11/17 0700 In: 2000 [I.V.:1000; IV Piggyback:1000] Out: -    Physical Exam:  Constitutional: alert, cooperative and no distress  HENT: normocephalic without obvious abnormality  Eyes: PERRL, EOM's grossly intact and symmetric  Respiratory: breathing non-labored at rest  Cardiovascular: regular rate and sinus rhythm  Gastrointestinal: Soft, non-tender, he remains distended and tympanic, no rebound/guarding  Musculoskeletal: no edema or wounds, motor and sensation grossly intact, NT    Labs:  CBC Latest Ref Rng & Units 10/30/2021 10/29/2021 05/19/2021  WBC 4.0 - 10.5 K/uL 5.9 14.2(H) 5.2  Hemoglobin 13.0 - 17.0 g/dL 7.5(L) 9.8(L) 11.3(L)  Hematocrit 39.0 - 52.0 % 25.7(L) 33.6(L) 36.2(L)  Platelets 150 - 400 K/uL 276 389 279   CMP Latest Ref Rng & Units 10/30/2021 10/29/2021 05/19/2021  Glucose 70 - 99 mg/dL 176(H) 270(H) 236(H)  BUN 6 - 20 mg/dL 13 15 11   Creatinine 0.61 - 1.24 mg/dL 1.04 1.52(H) 0.93  Sodium 135 - 145 mmol/L 133(L) 131(L) 136  Potassium 3.5 - 5.1 mmol/L 4.0 4.2 4.1  Chloride 98 - 111 mmol/L 100 95(L) 106  CO2 22 - 32 mmol/L 24 26 24   Calcium 8.9 - 10.3 mg/dL 7.7(L) 9.5 9.1  Total Protein 6.5 - 8.1 g/dL 6.5 8.6(H) -  Total Bilirubin 0.3 - 1.2 mg/dL 0.5 0.5 -  Alkaline Phos 38 - 126 U/L 71 100 -  AST 15 - 41 U/L 10(L) 16 -  ALT  0 - 44 U/L 6 8 -     Imaging studies: No new pertinent imaging studies   Assessment/Plan: (ICD-10's: K91.89) 50 y.o. male with what appears to be ascending colon mass, likely malignant, with resulting bowel obstruction, complicated by patient's change in mental status.               - Greatly appreciate medicine's willingness to admit and assist with this patient   - Plan on exploratory laparotomy; right hemicolectomy tomorrow  (11/18) with Dr Christian Mate pending OR/Anesthesia availability  - All risks, benefits, and alternatives to above procedure(s) were discussed with the patient, all of his questions were answered to his expressed satisfaction, patient expresses he wishes to proceed, and informed consent was obtained.   - Patient refused NGT placement in ED yesterday; I think we can hold off as long as not having any nausea/emesis             - Okay to advance to full liquids if tolerating; NPO at midnight              - Monitor abdominal examination; on-going bowel function             - Check CEA; elevated at 11.1  - Oncology evaluation / CT Chest would be needed to complete staging work up              - Pain control prn; antiemetics prn             - Mobilization as tolerated  - Further management per primary service; we will follow   All of the above findings and recommendations were discussed with the patient, and the medical team, and all of patient's questions were answered to his expressed satisfaction.  -- Edison Simon, PA-C [facility] 10/30/2021, 7:42 AM [phone] M-F: 7am - 4pm

## 2021-10-30 NOTE — Consult Note (Signed)
Hematology/Oncology Consult note [facility] [phone] [phone]  Patient Care Team: [facility], [facility] as PCP - General (General Practice)   Name of the patient: Joshua [name]  [id number]  1971/08/13   Date of visit: 10/30/21 REASON FOR COSULTATION:  Colon mass History of presenting illness-  50 y.o. male with PMH listed at below who presents to ER for evaluation of abdominal pain. Patient is somnolent and not able to provide any history today. History was obtained from chart review.  Work-up in the emergency room showed leukocytosis with WBC 14.2, hemoglobin 9.8, MCV 72.1 Acute kidney failure with creatinine 1.52 10/29/2021, CT showed obstructing circumferential apple core lesion measure approximately 6 cm in length in the ascending colon.  5 to 6 cm distal to the ileocecal valve.  Images consistent with primary colorectal neoplasm.  Lymphadenopathy evident in the ileocolic mesentery.  Diffuse fluid filled dilated small bowel.  Aortic atherosclerosis. CT head showed no intracranial abnormalities, sinus mucosal thickening  Baseline CEA 11.  Patient is admitted for SBO.  Hematology oncology was consulted for further evaluation.   Review of Systems  Unable to perform ROS: Mental status change   Allergies  Allergen Reactions   Penicillins Other (See Comments)    Patient unsure of allergy    Patient Active Problem List   Diagnosis Date Noted   SBO (small bowel obstruction) ([place]) 10/29/2021   Acute osteomyelitis of toe, right ([place]) 03/24/2021   Community acquired pneumonia 03/11/2021   CAD (coronary artery disease) 03/11/2021   Sepsis ([place]) 03/11/2021   Chest pain 03/11/2021   HTN (hypertension) 03/11/2021   Type 2 diabetes mellitus with complications ([place]) 87/56/4332   Tobacco abuse 03/11/2021   Polysubstance abuse ([place])    Osteomyelitis of great toe of right foot ([place]) 02/18/2021   Malnutrition of moderate  degree 02/14/2021   Cocaine abuse ([place]) 02/13/2021   Homeless 02/13/2021   Cellulitis of great toe of right foot 02/12/2021   Diabetic foot ulcer ([place]) 02/12/2021   Lactic acidosis 95/18/8416   Acute metabolic encephalopathy 60/63/0160   Chronic combined systolic (congestive) and diastolic (congestive) heart failure ([place]) 02/12/2021   SIRS (systemic inflammatory response syndrome) ([place]) 10/04/2020   Acute respiratory failure with hypoxia (H[facility]) 07/15/2020   Multifocal pneumonia 07/15/2020   History of MI (myocardial infarction) 07/15/2020   Symptomatic anemia 07/15/2020   Acute ST elevation myocardial infarction (STEMI) involving left anterior descending (LAD) coronary artery ([place]) 09/09/2018   STEMI involving left anterior descending coronary artery ([place]) 09/09/2018   Hypoglycemia 06/02/2016     Past Medical History:  Diagnosis Date   Abrasion of toe with infection, right, subsequent encounter 03/27/2021   CHF (congestive heart failure) ([place])    Diabetes mellitus without complication ([place])    Hypertension      Past Surgical History:  Procedure Laterality Date   AMPUTATION TOE Right 04/01/2021   Procedure: AMPUTATION TOE;  Surgeon: Sharlotte Alamo, DPM;  Location: [facility] ORS;  Service: Podiatry;  Laterality: Right;  RIGHT GREAT TOE   CORONARY/GRAFT ACUTE MI REVASCULARIZATION N/A 09/09/2018   Procedure: Coronary/Graft Acute MI Revascularization;  Surgeon: Yolonda Kida, MD;  Location: [facility];  Service: Cardiovascular;  Laterality: N/A;   LEFT HEART CATH AND CORONARY ANGIOGRAPHY N/A 09/09/2018   Procedure: LEFT HEART CATH AND CORONARY ANGIOGRAPHY;  Surgeon: Yolonda Kida, MD;  Location: [place];  Service: Cardiovascular;  Laterality: N/A;   LOWER EXTREMITY ANGIOGRAPHY Right 02/14/2021   Procedure: Lower  Extremity Angiography;  Surgeon: Katha Cabal, MD;  Location: [place];  Service: Cardiovascular;  Laterality: Right;   none     STENT  PLACEMENT VASCULAR ([facility] HX)      Social History   Socioeconomic History   Marital status: Single    Spouse name: Not on file   Number of children: Not on file   Years of education: Not on file   Highest education level: Not on file  Occupational History   Not on file  Tobacco Use   Smoking status: Every Day   Smokeless tobacco: Never  Vaping Use   Vaping Use: Never used  Substance and Sexual Activity   Alcohol use: Yes    Alcohol/week: 0.0 standard drinks   Drug use: Not Currently    Comment: pt states no found unknown white substance in mouth   Sexual activity: Not on file  Other Topics Concern   Not on file  Social History Narrative   Not on file   Social Determinants of Health   Financial Resource Strain: Not on file  Food Insecurity: Not on file  Transportation Needs: Not on file  Physical Activity: Not on file  Stress: Not on file  Social Connections: Not on file  Intimate Partner Violence: Not on file     Family History  Problem Relation Age of Onset   Cancer Mother    CAD Brother      Current Facility-Administered Medications:    acetaminophen (TYLENOL) tablet 650 mg, 650 mg, Oral, Q6H PRN **OR** acetaminophen (TYLENOL) suppository 650 mg, 650 mg, Rectal, Q6H PRN, Shawna Clamp, MD   carvedilol (COREG) tablet 3.125 mg, 3.125 mg, Oral, BID WC, Shawna Clamp, MD, 3.125 mg at 10/30/21 0938   dextrose 5 %-0.45 % sodium chloride infusion, , Intravenous, Continuous, Amin, Ankit Chirag, MD   docusate sodium (COLACE) capsule 100 mg, 100 mg, Oral, BID, Shawna Clamp, MD, 100 mg at 10/30/21 0938   enoxaparin (LOVENOX) injection 40 mg, 40 mg, Subcutaneous, Q24H, Shawna Clamp, MD, 40 mg at 10/29/21 1612   guaiFENesin (ROBITUSSIN) 100 MG/5ML liquid 5 mL, 5 mL, Oral, Q4H PRN, Amin, Ankit Chirag, MD   hydrALAZINE (APRESOLINE) injection 10 mg, 10 mg, Intravenous, Q4H PRN, Amin, Ankit Chirag, MD   insulin aspart (novoLOG) injection 0-5 Units, 0-5 Units,  Subcutaneous, QHS, Kumar, Pardeep, MD   insulin aspart (novoLOG) injection 0-9 Units, 0-9 Units, Subcutaneous, TID WC, Kumar, Pardeep, MD   ipratropium-albuterol (DUONEB) 0.5-2.5 (3) MG/3ML nebulizer solution 3 mL, 3 mL, Nebulization, Q4H PRN, Amin, Ankit Chirag, MD   metoprolol tartrate (LOPRESSOR) injection 5 mg, 5 mg, Intravenous, Q4H PRN, Amin, Ankit Chirag, MD   ondansetron (ZOFRAN) tablet 4 mg, 4 mg, Oral, Q6H PRN **OR** ondansetron (ZOFRAN) injection 4 mg, 4 mg, Intravenous, Q6H PRN, Shawna Clamp, MD   senna-docusate (Senokot-S) tablet 1 tablet, 1 tablet, Oral, QHS PRN, Amin, Ankit Chirag, MD   spironolactone (ALDACTONE) tablet 25 mg, 25 mg, Oral, Daily, Shawna Clamp, MD, 25 mg at 10/30/21 4540   traZODone (DESYREL) tablet 50 mg, 50 mg, Oral, QHS PRN, Amin, Ankit Chirag, MD  Current Outpatient Medications:    ascorbic acid (VITAMIN C) 250 MG tablet, Take 1 tablet (250 mg total) by mouth 2 (two) times daily., Disp: 60 tablet, Rfl: 1   buPROPion (WELLBUTRIN XL) 150 MG 24 hr tablet, Take 150 mg by mouth daily as needed., Disp: , Rfl:    carvedilol (COREG) 3.125 MG tablet, Take 1 tablet (3.125 mg total)  by mouth 2 (two) times daily with a meal., Disp: 60 tablet, Rfl: 1   DULoxetine (CYMBALTA) 30 MG capsule, Take 30 mg by mouth daily., Disp: , Rfl:    furosemide (LASIX) 20 MG tablet, Take 1 tablet (20 mg total) by mouth daily as needed (for weight gain >3lbs in 1 days and >5lbs in 2 days)., Disp: 30 tablet, Rfl: 1   insulin glargine (LANTUS) 100 UNIT/ML Solostar Pen, Inject 40 Units into the skin daily., Disp: 15 mL, Rfl: 11   losartan (COZAAR) 25 MG tablet, Take 1 tablet (25 mg total) by mouth daily., Disp: 30 tablet, Rfl: 1   polyethylene glycol powder (GLYCOLAX/MIRALAX) 17 GM/SCOOP powder, Take 17 g by mouth daily., Disp: , Rfl:    spironolactone (ALDACTONE) 25 MG tablet, Take 1 tablet (25 mg total) by mouth daily., Disp: 30 tablet, Rfl: 1   dapagliflozin propanediol (FARXIGA) 5 MG TABS  tablet, Take 1 tablet (5 mg total) by mouth daily. (Patient not taking: Reported on 10/29/2021), Disp: 30 tablet, Rfl: 1   fluticasone (FLONASE) 50 MCG/ACT nasal spray, Place 2 sprays into both nostrils daily. (Patient not taking: Reported on 10/29/2021), Disp: 16 g, Rfl: 0   gabapentin (NEURONTIN) 100 MG capsule, Take 1 capsule (100 mg total) by mouth at bedtime. (Patient not taking: Reported on 10/29/2021), Disp: 30 capsule, Rfl: 1   Insulin Pen Needle (PEN NEEDLES) 31G X 5 MM MISC, 40 Units by Does not apply route at bedtime., Disp: 100 each, Rfl: 1   metFORMIN (GLUCOPHAGE) 500 MG tablet, Take 1 tablet (500 mg total) by mouth daily with breakfast., Disp: 30 tablet, Rfl: 1   Multiple Vitamin (MULTIVITAMIN WITH MINERALS) TABS tablet, Take 1 tablet by mouth daily., Disp: 90 tablet, Rfl: 1   Physical exam:  Vitals:   10/30/21 0130 10/30/21 0340 10/30/21 0430 10/30/21 0938  BP:   113/77 (!) 108/94  Pulse: 87 96 92 (!) 112  Resp:   18   Temp:   98.2 F (36.8 C)   TempSrc:   Oral   SpO2: 95% 100% 98%   Weight:      Height:       Physical Exam HENT:     Head: Normocephalic and atraumatic.  Cardiovascular:     Rate and Rhythm: Normal rate.  Pulmonary:     Effort: Pulmonary effort is normal. No respiratory distress.  Abdominal:     General: There is distension.  Musculoskeletal:        General: No swelling.  Skin:    General: Skin is warm.  Neurological:     Comments: Somnolent        CMP Latest Ref Rng & Units 10/30/2021  Glucose 70 - 99 mg/dL 176(H)  BUN 6 - 20 mg/dL 13  Creatinine 0.61 - 1.24 mg/dL 1.04  Sodium 135 - 145 mmol/L 133(L)  Potassium 3.5 - 5.1 mmol/L 4.0  Chloride 98 - 111 mmol/L 100  CO2 22 - 32 mmol/L 24  Calcium 8.9 - 10.3 mg/dL 7.7(L)  Total Protein 6.5 - 8.1 g/dL 6.5  Total Bilirubin 0.3 - 1.2 mg/dL 0.5  Alkaline Phos 38 - 126 U/L 71  AST 15 - 41 U/L 10(L)  ALT 0 - 44 U/L 6   CBC Latest Ref Rng & Units 10/30/2021  WBC 4.0 - 10.5 K/uL 5.9   Hemoglobin 13.0 - 17.0 g/dL 7.5(L)  Hematocrit 39.0 - 52.0 % 25.7(L)  Platelets 150 - 400 K/uL 276    RADIOGRAPHIC STUDIES: I have  personally reviewed the radiological images as listed and agreed with the findings in the report. CT HEAD WO CONTRAST (5MM)  Result Date: 10/29/2021 CLINICAL DATA:  Altered mental status.  Abdominal pain. EXAM: CT HEAD WITHOUT CONTRAST TECHNIQUE: Contiguous axial images were obtained from the base of the skull through the vertex without intravenous contrast. COMPARISON:  02/12/2021. FINDINGS: Brain: No evidence of acute infarction, hemorrhage, hydrocephalus, extra-axial collection or mass lesion/mass effect. Vascular: No hyperdense vessel or unexpected calcification. Skull: Normal. Negative for fracture or focal lesion. Sinuses/Orbits: Visualized globes and orbits are unremarkable. Mild right frontal sinus mucosal thickening with some dependent fluid. Mild anterior right ethmoid sinus mucosal thickening. Scattered minor bilateral ethmoid sinus mucosal thickening and sphenoid sinus mucosal thickening. Other: None. IMPRESSION: 1. No intracranial abnormalities. 2. Mild sinus mucosal thickening. Electronically Signed   By: Lajean Manes M.D.   On: 10/29/2021 15:52   CT ABDOMEN PELVIS W CONTRAST  Result Date: 10/29/2021 CLINICAL DATA:  Abdominal pain EXAM: CT ABDOMEN AND PELVIS WITH CONTRAST TECHNIQUE: Multidetector CT imaging of the abdomen and pelvis was performed using the standard protocol following bolus administration of intravenous contrast. CONTRAST:  167mL OMNIPAQUE IOHEXOL 300 MG/ML  SOLN COMPARISON:  10/04/2020 FINDINGS: Lower chest: Unremarkable. Hepatobiliary: No suspicious focal abnormality within the liver parenchyma. There is no evidence for gallstones, gallbladder wall thickening, or pericholecystic fluid. No intrahepatic or extrahepatic biliary dilation. Pancreas: No focal mass lesion. No dilatation of the main duct. No intraparenchymal cyst. No  peripancreatic edema. Spleen: No splenomegaly. No focal mass lesion. Adrenals/Urinary Tract: No adrenal nodule or mass. Kidneys unremarkable. No evidence for hydroureter. The urinary bladder appears normal for the degree of distention. Stomach/Bowel: Stomach is unremarkable. No gastric wall thickening. No evidence of outlet obstruction. Duodenum is normally positioned as is the ligament of Treitz. Mild duodenal distension. Diffuse fluids no small bowel dilatation noted from the ligament of Treitz to the terminal ileum. Cecum is distended and fluid-filled. Circumferential "apple-core" lesion identified in the ascending colon about 5-6 cm distal to the ileocecal valve. Colon distal to this lesion is largely decompressed although there is some gas and stool in the sigmoid colon and rectum. Vascular/Lymphatic: There is moderate atherosclerotic calcification of the abdominal aorta without aneurysm. There is no gastrohepatic or hepatoduodenal ligament lymphadenopathy. No para-aortic lymphadenopathy. 14 mm ileocolic lymph node on image 52/2 appears to have central necrosis. Adjacent 2.1 cm short axis lymph node visible on image 51/2. Other upper normal to borderline enlarged lymph nodes are seen in the ileocolic mesentery. No pelvic sidewall lymphadenopathy. Reproductive: The prostate gland and seminal vesicles are unremarkable. Other: No intraperitoneal free fluid. Musculoskeletal: No worrisome lytic or sclerotic osseous abnormality. IMPRESSION: 1. Obstructing circumferential apple-core lesion measuring approximately 6 cm in length is identified in the ascending colon, located about 5-6 cm distal to the ileocecal valve. 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 Lymphadenopathy is evident in the ileocolic mesentery, some of which shows central necrosis and is highly suspicious for metastatic disease. 2. Diffuse fluid-filled dilated small bowel from the ligament of Treitz to the terminal ileum  measuring up to 3-4 cm diameter. Imaging features are compatible with obstruction. 3. Aortic Atherosclerosis (ICD10-I70.0). I discussed these findings by telephone with Dr. Kerman Passey at 0859 hours on 10/29/2021. Electronically Signed   By: Misty Stanley M.D.   On: 10/29/2021 09:02   ECHOCARDIOGRAM COMPLETE  Result Date: 10/30/2021    ECHOCARDIOGRAM REPORT   Patient Name:   Joshua [name] Date of Exam: 10/29/2021 Medical Rec #:  [id number]             Height:       70.0 in Accession #:    [id number]            Weight:       185.0 lb Date of Birth:  09-17-1971            BSA:          2.019 m Patient Age:    25 years              BP:           108/75 mmHg Patient Gender: M                     HR:           93 bpm. Exam Location:  [facility] Procedure: 2D Echo, Cardiac Doppler, Color Doppler and Intracardiac            Opacification Agent Indications:     K53.97 Acute Systolic Heart Failure  History:         Patient has prior history of Echocardiogram examinations, most                  recent 07/16/2020. CHF; Risk Factors:Hypertension and Diabetes.  Sonographer:     Cresenciano Lick RDCS Referring Phys:  [place] Diagnosing Phys: Ida Rogue MD IMPRESSIONS  1. Left ventricular ejection fraction, by estimation, is 25 to 30%. The left ventricle has severely decreased function. The left ventricle demonstrates moderate global hypokinesis with severe hypokinesis of teh anterior/anteroseptal and apical regions. The left ventricular internal cavity size was moderately dilated. Left ventricular diastolic parameters are consistent with Grade II diastolic dysfunction (pseudonormalization).  2. Right ventricular systolic function is normal. The right ventricular size is normal.  3. Left atrial size was moderately dilated.  4. The mitral valve is normal in structure. Mild to moderate mitral valve regurgitation. No evidence of mitral stenosis.  5. The aortic valve is normal in structure. Aortic valve  regurgitation is not visualized. No aortic stenosis is present.  6. The inferior vena cava is normal in size with greater than 50% respiratory variability, suggesting right atrial pressure of 3 mmHg. FINDINGS  Left Ventricle: Left ventricular ejection fraction, by estimation, is 25 to 30%. The left ventricle has severely decreased function. The left ventricle demonstrates global hypokinesis. Definity contrast agent was given IV to delineate the left ventricular endocardial borders. The left ventricular internal cavity size was moderately dilated. There is no left ventricular hypertrophy. Left ventricular diastolic parameters are consistent with Grade II diastolic dysfunction (pseudonormalization). Right Ventricle: The right ventricular size is normal. No increase in right ventricular wall thickness. Right ventricular systolic function is normal. Left Atrium: Left atrial size was moderately dilated. Right Atrium: Right atrial size was normal in size. Pericardium: There is no evidence of pericardial effusion. Mitral Valve: The mitral valve is normal in structure. Mild to moderate mitral valve regurgitation, with eccentric laterally directed jet. No evidence of mitral valve stenosis. Tricuspid Valve: The tricuspid valve is normal in structure. Tricuspid valve regurgitation is mild . No evidence of tricuspid stenosis. Aortic Valve: The aortic valve is normal in structure. Aortic valve regurgitation is not visualized. No aortic stenosis is present. Pulmonic Valve: The pulmonic valve was normal in structure. Pulmonic valve regurgitation is not visualized. No evidence of pulmonic stenosis. Aorta: The aortic root is normal in size and structure. Venous: The inferior vena cava  is normal in size with greater than 50% respiratory variability, suggesting right atrial pressure of 3 mmHg. IAS/Shunts: No atrial level shunt detected by color flow Doppler.  LEFT VENTRICLE PLAX 2D LVIDd:         6.00 cm   Diastology LVIDs:          4.30 cm   LV e' medial:    6.64 cm/s LV PW:         0.90 cm   LV E/e' medial:  17.6 LV IVS:        0.90 cm   LV e' lateral:   6.31 cm/s LVOT diam:     2.00 cm   LV E/e' lateral: 18.5 LV SV:         33 LV SV Index:   17 LVOT Area:     3.14 cm  RIGHT VENTRICLE RV Basal diam:  4.20 cm RV S prime:     12.20 cm/s TAPSE (M-mode): 2.1 cm LEFT ATRIUM             Index        RIGHT ATRIUM           Index LA diam:        4.90 cm 2.43 cm/m   RA Area:     14.90 cm LA Vol (A2C):   56.8 ml 28.13 ml/m  RA Volume:   39.70 ml  19.66 ml/m LA Vol (A4C):   49.0 ml 24.27 ml/m LA Biplane Vol: 56.2 ml 27.83 ml/m  AORTIC VALVE LVOT Vmax:   67.15 cm/s LVOT Vmean:  49.750 cm/s LVOT VTI:    0.107 m  AORTA Ao Root diam: 3.60 cm Ao Asc diam:  3.40 cm MITRAL VALVE MV Area (PHT): 3.42 cm     SHUNTS MV Decel Time: 222 msec     Systemic VTI:  0.11 m MV E velocity: 117.00 cm/s  Systemic Diam: 2.00 cm MV A velocity: 109.00 cm/s MV E/A ratio:  1.07 Ida Rogue MD Electronically signed by Ida Rogue MD Signature Date/Time: 10/30/2021/8:01:03 AM    Final     Assessment and plan-  #Small bowel obstruction due to ascending colon mass. N.p.o., surgery CT scan was independently reviewed by me. Further management depending on surgical specimen/staging.  #Microcytic anemia, iron panel is consistent with iron deficiency.  Recommend IV Venofer treatments.     Thank you for allowing me to participate in the care of this patient.   Earlie Server, MD, PhD  10/30/2021

## 2021-10-30 NOTE — Consult Note (Signed)
Joshua [name] is a 50 y.o. male  [id number]  Primary Cardiologist: Neoma Laming Reason for Consultation: Chest pain: Mass in colon  HPI: Is a 50 year old African-American male who presented with a colon mass which required surgery.  I was asked to evaluate the patient for preoperative clearance.   Review of Systems: No chest pain or shortness of breath at this time   Past Medical History:  Diagnosis Date   Abrasion of toe with infection, right, subsequent encounter 03/27/2021   CHF (congestive heart failure) ([place])    Diabetes mellitus without complication ([facility])    Hypertension     (Not in a hospital admission)     carvedilol  3.125 mg Oral BID WC   docusate sodium  100 mg Oral BID   enoxaparin (LOVENOX) injection  40 mg Subcutaneous Q24H   insulin aspart  0-5 Units Subcutaneous QHS   insulin aspart  0-9 Units Subcutaneous TID WC   spironolactone  25 mg Oral Daily    Infusions:  dextrose 5 % and 0.45% NaCl      Allergies  Allergen Reactions   Penicillins Other (See Comments)    Patient unsure of allergy    Social History   Socioeconomic History   Marital status: Single    Spouse name: Not on file   Number of children: Not on file   Years of education: Not on file   Highest education level: Not on file  Occupational History   Not on file  Tobacco Use   Smoking status: Every Day   Smokeless tobacco: Never  Vaping Use   Vaping Use: Never used  Substance and Sexual Activity   Alcohol use: Yes    Alcohol/week: 0.0 standard drinks   Drug use: Not Currently    Comment: pt states no found unknown white substance in mouth   Sexual activity: Not on file  Other Topics Concern   Not on file  Social History Narrative   Not on file   Social Determinants of Health   Financial Resource Strain: Not on file  Food Insecurity: Not on file  Transportation Needs: Not on file  Physical Activity: Not on file  Stress: Not on file  Social Connections: Not  on file  Intimate Partner Violence: Not on file    Family History  Problem Relation Age of Onset   Cancer Mother    CAD Brother     PHYSICAL EXAM: Vitals:   10/30/21 0430 10/30/21 0938  BP: 113/77 (!) 108/94  Pulse: 92 (!) 112  Resp: 18   Temp: 98.2 F (36.8 C)   SpO2: 98%      Intake/Output Summary (Last 24 hours) at 10/30/2021 1057 Last data filed at 10/29/2021 1604 Gross per 24 hour  Intake 2000 ml  Output --  Net 2000 ml    General:  Well appearing. No respiratory difficulty HEENT: normal Neck: supple. no JVD. Carotids 2+ bilat; no bruits. No lymphadenopathy or thryomegaly appreciated. Cor: PMI nondisplaced. Regular rate & rhythm. No rubs, gallops or murmurs. Lungs: clear Abdomen: soft, nontender, nondistended. No hepatosplenomegaly. No bruits or masses. Good bowel sounds. Extremities: no cyanosis, clubbing, rash, edema Neuro: alert & oriented x 3, cranial nerves grossly intact. moves all 4 extremities w/o difficulty. Affect pleasant.  ECG: No acute changes  Results for orders placed or performed during the hospital encounter of 10/29/21 (from the past 24 hour(s))  CBG monitoring, ED     Status: Abnormal   Collection Time: 10/29/21  4:07 PM  Result Value Ref Range   Glucose-Capillary 138 (H) 70 - 99 mg/dL  Comprehensive metabolic panel     Status: Abnormal   Collection Time: 10/30/21  6:24 AM  Result Value Ref Range   Sodium 133 (L) 135 - 145 mmol/L   Potassium 4.0 3.5 - 5.1 mmol/L   Chloride 100 98 - 111 mmol/L   CO2 24 22 - 32 mmol/L   Glucose, Bld 176 (H) 70 - 99 mg/dL   BUN 13 6 - 20 mg/dL   Creatinine, Ser 1.04 0.61 - 1.24 mg/dL   Calcium 7.7 (L) 8.9 - 10.3 mg/dL   Total Protein 6.5 6.5 - 8.1 g/dL   Albumin 2.6 (L) 3.5 - 5.0 g/dL   AST 10 (L) 15 - 41 U/L   ALT 6 0 - 44 U/L   Alkaline Phosphatase 71 38 - 126 U/L   Total Bilirubin 0.5 0.3 - 1.2 mg/dL   GFR, Estimated >60 >60 mL/min   Anion gap 9 5 - 15  CBC     Status: Abnormal   Collection  Time: 10/30/21  6:24 AM  Result Value Ref Range   WBC 5.9 4.0 - 10.5 K/uL   RBC 3.58 (L) 4.22 - 5.81 MIL/uL   Hemoglobin 7.5 (L) 13.0 - 17.0 g/dL   HCT 25.7 (L) 39.0 - 52.0 %   MCV 71.8 (L) 80.0 - 100.0 fL   MCH 20.9 (L) 26.0 - 34.0 pg   MCHC 29.2 (L) 30.0 - 36.0 g/dL   RDW 16.4 (H) 11.5 - 15.5 %   Platelets 276 150 - 400 K/uL   nRBC 0.0 0.0 - 0.2 %  Magnesium     Status: None   Collection Time: 10/30/21  6:24 AM  Result Value Ref Range   Magnesium 2.0 1.7 - 2.4 mg/dL  Phosphorus     Status: None   Collection Time: 10/30/21  6:24 AM  Result Value Ref Range   Phosphorus 3.4 2.5 - 4.6 mg/dL  CBG monitoring, ED     Status: Abnormal   Collection Time: 10/30/21  9:38 AM  Result Value Ref Range   Glucose-Capillary 191 (H) 70 - 99 mg/dL   CT HEAD WO CONTRAST (5MM)  Result Date: 10/29/2021 CLINICAL DATA:  Altered mental status.  Abdominal pain. EXAM: CT HEAD WITHOUT CONTRAST TECHNIQUE: Contiguous axial images were obtained from the base of the skull through the vertex without intravenous contrast. COMPARISON:  02/12/2021. FINDINGS: Brain: No evidence of acute infarction, hemorrhage, hydrocephalus, extra-axial collection or mass lesion/mass effect. Vascular: No hyperdense vessel or unexpected calcification. Skull: Normal. Negative for fracture or focal lesion. Sinuses/Orbits: Visualized globes and orbits are unremarkable. Mild right frontal sinus mucosal thickening with some dependent fluid. Mild anterior right ethmoid sinus mucosal thickening. Scattered minor bilateral ethmoid sinus mucosal thickening and sphenoid sinus mucosal thickening. Other: None. IMPRESSION: 1. No intracranial abnormalities. 2. Mild sinus mucosal thickening. Electronically Signed   By: Lajean Manes M.D.   On: 10/29/2021 15:52   CT ABDOMEN PELVIS W CONTRAST  Result Date: 10/29/2021 CLINICAL DATA:  Abdominal pain EXAM: CT ABDOMEN AND PELVIS WITH CONTRAST TECHNIQUE: Multidetector CT imaging of the abdomen and pelvis was  performed using the standard protocol following bolus administration of intravenous contrast. CONTRAST:  115mL OMNIPAQUE IOHEXOL 300 MG/ML  SOLN COMPARISON:  10/04/2020 FINDINGS: Lower chest: Unremarkable. Hepatobiliary: No suspicious focal abnormality within the liver parenchyma. There is no evidence for gallstones, gallbladder wall thickening, or pericholecystic fluid. No intrahepatic or extrahepatic biliary  dilation. Pancreas: No focal mass lesion. No dilatation of the main duct. No intraparenchymal cyst. No peripancreatic edema. Spleen: No splenomegaly. No focal mass lesion. Adrenals/Urinary Tract: No adrenal nodule or mass. Kidneys unremarkable. No evidence for hydroureter. The urinary bladder appears normal for the degree of distention. Stomach/Bowel: Stomach is unremarkable. No gastric wall thickening. No evidence of outlet obstruction. Duodenum is normally positioned as is the ligament of Treitz. Mild duodenal distension. Diffuse fluids no small bowel dilatation noted from the ligament of Treitz to the terminal ileum. Cecum is distended and fluid-filled. Circumferential "apple-core" lesion identified in the ascending colon about 5-6 cm distal to the ileocecal valve. Colon distal to this lesion is largely decompressed although there is some gas and stool in the sigmoid colon and rectum. Vascular/Lymphatic: There is moderate atherosclerotic calcification of the abdominal aorta without aneurysm. There is no gastrohepatic or hepatoduodenal ligament lymphadenopathy. No para-aortic lymphadenopathy. 14 mm ileocolic lymph node on image 52/2 appears to have central necrosis. Adjacent 2.1 cm short axis lymph node visible on image 51/2. Other upper normal to borderline enlarged lymph nodes are seen in the ileocolic mesentery. No pelvic sidewall lymphadenopathy. Reproductive: The prostate gland and seminal vesicles are unremarkable. Other: No intraperitoneal free fluid. Musculoskeletal: No worrisome lytic or sclerotic  osseous abnormality. IMPRESSION: 1. Obstructing circumferential apple-core lesion measuring approximately 6 cm in length is identified in the ascending colon, located about 5-6 cm distal to the ileocecal valve. 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 Lymphadenopathy is evident in the ileocolic mesentery, some of which shows central necrosis and is highly suspicious for metastatic disease. 2. Diffuse fluid-filled dilated small bowel from the ligament of Treitz to the terminal ileum measuring up to 3-4 cm diameter. Imaging features are compatible with obstruction. 3. Aortic Atherosclerosis (ICD10-I70.0). I discussed these findings by telephone with Dr. Kerman Passey at 0859 hours on 10/29/2021. Electronically Signed   By: Misty Stanley M.D.   On: 10/29/2021 09:02   ECHOCARDIOGRAM COMPLETE  Result Date: 10/30/2021    ECHOCARDIOGRAM REPORT   Patient Name:   Joshua [name] Date of Exam: 10/29/2021 Medical Rec #:  [id number]             Height:       70.0 in Accession #:    [id number]            Weight:       185.0 lb Date of Birth:  Jul 27, 1971            BSA:          2.019 m Patient Age:    74 years              BP:           108/75 mmHg Patient Gender: M                     HR:           93 bpm. Exam Location:  [facility] Procedure: 2D Echo, Cardiac Doppler, Color Doppler and Intracardiac            Opacification Agent Indications:     R44.31 Acute Systolic Heart Failure  History:         Patient has prior history of Echocardiogram examinations, most                  recent 07/16/2020. CHF; Risk Factors:Hypertension and Diabetes.  Sonographer:     Cresenciano Lick RDCS Referring Phys:  [phone]  PARDEEP SUPJS Diagnosing Phys: Ida Rogue MD IMPRESSIONS  1. Left ventricular ejection fraction, by estimation, is 25 to 30%. The left ventricle has severely decreased function. The left ventricle demonstrates moderate global hypokinesis with severe hypokinesis of teh anterior/anteroseptal and  apical regions. The left ventricular internal cavity size was moderately dilated. Left ventricular diastolic parameters are consistent with Grade II diastolic dysfunction (pseudonormalization).  2. Right ventricular systolic function is normal. The right ventricular size is normal.  3. Left atrial size was moderately dilated.  4. The mitral valve is normal in structure. Mild to moderate mitral valve regurgitation. No evidence of mitral stenosis.  5. The aortic valve is normal in structure. Aortic valve regurgitation is not visualized. No aortic stenosis is present.  6. The inferior vena cava is normal in size with greater than 50% respiratory variability, suggesting right atrial pressure of 3 mmHg. FINDINGS  Left Ventricle: Left ventricular ejection fraction, by estimation, is 25 to 30%. The left ventricle has severely decreased function. The left ventricle demonstrates global hypokinesis. Definity contrast agent was given IV to delineate the left ventricular endocardial borders. The left ventricular internal cavity size was moderately dilated. There is no left ventricular hypertrophy. Left ventricular diastolic parameters are consistent with Grade II diastolic dysfunction (pseudonormalization). Right Ventricle: The right ventricular size is normal. No increase in right ventricular wall thickness. Right ventricular systolic function is normal. Left Atrium: Left atrial size was moderately dilated. Right Atrium: Right atrial size was normal in size. Pericardium: There is no evidence of pericardial effusion. Mitral Valve: The mitral valve is normal in structure. Mild to moderate mitral valve regurgitation, with eccentric laterally directed jet. No evidence of mitral valve stenosis. Tricuspid Valve: The tricuspid valve is normal in structure. Tricuspid valve regurgitation is mild . No evidence of tricuspid stenosis. Aortic Valve: The aortic valve is normal in structure. Aortic valve regurgitation is not visualized. No  aortic stenosis is present. Pulmonic Valve: The pulmonic valve was normal in structure. Pulmonic valve regurgitation is not visualized. No evidence of pulmonic stenosis. Aorta: The aortic root is normal in size and structure. Venous: The inferior vena cava is normal in size with greater than 50% respiratory variability, suggesting right atrial pressure of 3 mmHg. IAS/Shunts: No atrial level shunt detected by color flow Doppler.  LEFT VENTRICLE PLAX 2D LVIDd:         6.00 cm   Diastology LVIDs:         4.30 cm   LV e' medial:    6.64 cm/s LV PW:         0.90 cm   LV E/e' medial:  17.6 LV IVS:        0.90 cm   LV e' lateral:   6.31 cm/s LVOT diam:     2.00 cm   LV E/e' lateral: 18.5 LV SV:         33 LV SV Index:   17 LVOT Area:     3.14 cm  RIGHT VENTRICLE RV Basal diam:  4.20 cm RV S prime:     12.20 cm/s TAPSE (M-mode): 2.1 cm LEFT ATRIUM             Index        RIGHT ATRIUM           Index LA diam:        4.90 cm 2.43 cm/m   RA Area:     14.90 cm LA Vol (A2C):   56.8 ml 28.13 ml/m  RA Volume:   39.70 ml  19.66 ml/m LA Vol (A4C):   49.0 ml 24.27 ml/m LA Biplane Vol: 56.2 ml 27.83 ml/m  AORTIC VALVE LVOT Vmax:   67.15 cm/s LVOT Vmean:  49.750 cm/s LVOT VTI:    0.107 m  AORTA Ao Root diam: 3.60 cm Ao Asc diam:  3.40 cm MITRAL VALVE MV Area (PHT): 3.42 cm     SHUNTS MV Decel Time: 222 msec     Systemic VTI:  0.11 m MV E velocity: 117.00 cm/s  Systemic Diam: 2.00 cm MV A velocity: 109.00 cm/s MV E/A ratio:  1.07 Ida Rogue MD Electronically signed by Ida Rogue MD Signature Date/Time: 10/30/2021/8:01:03 AM    Final      ASSESSMENT AND PLAN: Occasional atypical chest pain with compensated CHF.  Advise proceeding with surgery.  Elzy Tomasello A

## 2021-10-30 NOTE — ED Notes (Signed)
Informed RN bed assigned 

## 2021-10-30 NOTE — Progress Notes (Signed)
PROGRESS NOTE    Joshua [name]  BJY:[id number] DOB: 27-Mar-1971 DOA: 10/29/2021 PCP: [facility] [place]   Brief Narrative:  50 year old with history of chronic systolic CHF, DM2, HTN, substance abuse, tobacco use came to the ED with complaints of 2 weeks of ongoing abdominal pain.  Work-up in the ED on the CT scan showed obstructing circumferential apple core lesion in the ascending colon consistent with colorectal neoplasm with lymphadenopathy, central necrosis and likely metastatic disease.  General surgery was consulted and recommending patient will need and benefit from right-sided hemicolectomy to relieve the obstruction.  Due to consenting issue, psychiatry was consulted.   Assessment & Plan:   Principal Problem:   SBO (small bowel obstruction) (HCC) Active Problems:   Acute metabolic encephalopathy   HTN (hypertension)   Type 2 diabetes mellitus with complications (HCC)   Tobacco abuse   Polysubstance abuse (HCC)  Complete obstruction of small bowel secondary to primary colorectal malignancy - CT scan is concerning for primary colorectal malignancy with mets.  Patient is refusing NG tube.  Patient at this time does not have any nausea vomiting type symptoms.  General surgery is following no plans on performing expiratory laparotomy and right-sided hemicolectomy tomorrow morning.  Primary colorectal malignancy with metastasis, presumed - CEA levels-11.1 - Oncology has been consulted, will defer further staging work-up to their service  Somnolence - Resolved  Hyponatremia Acute kidney injury - Suspect from dehydration.  Renal function has improved.  Creatinine 1.52 >1.04  Anemia of chronic disease, microcytic - Suspect microcytic loss from occult malignancy.  We will check iron studies, B12, folate  Diabetes mellitus type 2, uncontrolled due to hyperglycemia - On sliding scale and Accu-Chek.  Lantus 10 units at bedtime - A1c 8.5  Essential  hypertension - Coreg  Chronic congestive heart failure with reduced ejection fraction, EF 25% - Continue Coreg.  Lasix and losartan on hold.  Cardiology input appreciated - Repeat echocardiogram - ef 25-30%, g2DD      DVT prophylaxis: Lovenox Code Status: Full code Family Communication: Significant other/friend at bedside  Status is: Inpatient  Remains inpatient appropriate because: Patient is unable to tolerate any orals much.  He does have significant amount of colonic obstruction likely from malignancy.  Plans for expiratory laparotomy on 11/18.   Subjective: During my visit patient did not have any complaints.  He was still unsure if he wants to proceed with surgery, denying any other complaints.  Review of Systems Otherwise negative except as per HPI, including: General: Denies fever, chills, night sweats or unintended weight loss. Resp: Denies cough, wheezing, shortness of breath. Cardiac: Denies chest pain, palpitations, orthopnea, paroxysmal nocturnal dyspnea. GI: Denies abdominal pain, nausea, vomiting, diarrhea or constipation GU: Denies dysuria, frequency, hesitancy or incontinence MS: Denies muscle aches, joint pain or swelling Neuro: Denies headache, neurologic deficits (focal weakness, numbness, tingling), abnormal gait Psych: Denies anxiety, depression, SI/HI/AVH Skin: Denies new rashes or lesions ID: Denies sick contacts, exotic exposures, travel  Examination:  General exam: Appears calm and comfortable  Respiratory system: Clear to auscultation. Respiratory effort normal. Cardiovascular system: S1 & S2 heard, RRR. No JVD, murmurs, rubs, gallops or clicks. No pedal edema. Gastrointestinal system: Abdomen is nondistended, soft and nontender. No organomegaly or masses felt. Normal bowel sounds heard. Central nervous system: Alert and oriented. No focal neurological deficits. Extremities: Symmetric 5 x 5 power. Skin: No rashes, lesions or ulcers Psychiatry:  Judgement and insight appear normal. Mood & affect appropriate.     Objective: Vitals:  10/30/21 0100 10/30/21 0130 10/30/21 0340 10/30/21 0430  BP: 116/75   113/77  Pulse: 87 87 96 92  Resp: 18   18  Temp: 98.6 F (37 C)   98.2 F (36.8 C)  TempSrc: Oral   Oral  SpO2: 95% 95% 100% 98%  Weight:      Height:        Intake/Output Summary (Last 24 hours) at 10/30/2021 0831 Last data filed at 10/29/2021 1604 Gross per 24 hour  Intake 2000 ml  Output --  Net 2000 ml   Filed Weights   10/29/21 0710  Weight: 83.9 kg     Data Reviewed:   CBC: Recent Labs  Lab 10/29/21 0710 10/30/21 0624  WBC 14.2* 5.9  HGB 9.8* 7.5*  HCT 33.6* 25.7*  MCV 72.1* 71.8*  PLT 389 297   Basic Metabolic Panel: Recent Labs  Lab 10/29/21 0710 10/30/21 0624  NA 131* 133*  K 4.2 4.0  CL 95* 100  CO2 26 24  GLUCOSE 270* 176*  BUN 15 13  CREATININE 1.52* 1.04  CALCIUM 9.5 7.7*  MG  --  2.0  PHOS  --  3.4   GFR: Estimated Creatinine Clearance: 87.7 mL/min (by C-G formula based on SCr of 1.04 mg/dL). Liver Function Tests: Recent Labs  Lab 10/29/21 0710 10/30/21 0624  AST 16 10*  ALT 8 6  ALKPHOS 100 71  BILITOT 0.5 0.5  PROT 8.6* 6.5  ALBUMIN 3.6 2.6*   Recent Labs  Lab 10/29/21 0710  LIPASE 37   Recent Labs  Lab 10/29/21 0955  AMMONIA 28   Coagulation Profile: No results for input(s): INR, PROTIME in the last 168 hours. Cardiac Enzymes: No results for input(s): CKTOTAL, CKMB, CKMBINDEX, TROPONINI in the last 168 hours. BNP (last 3 results) No results for input(s): PROBNP in the last 8760 hours. HbA1C: Recent Labs    10/29/21 0710  HGBA1C 8.5*   CBG: Recent Labs  Lab 10/29/21 0639 10/29/21 1607  GLUCAP 235* 138*   Lipid Profile: No results for input(s): CHOL, HDL, LDLCALC, TRIG, CHOLHDL, LDLDIRECT in the last 72 hours. Thyroid Function Tests: No results for input(s): TSH, T4TOTAL, FREET4, T3FREE, THYROIDAB in the last 72 hours. Anemia Panel: No  results for input(s): VITAMINB12, FOLATE, FERRITIN, TIBC, IRON, RETICCTPCT in the last 72 hours. Sepsis Labs: No results for input(s): PROCALCITON, LATICACIDVEN in the last 168 hours.  Recent Results (from the past 240 hour(s))  Resp Panel by RT-PCR (Flu A&B, Covid) Nasopharyngeal Swab     Status: None   Collection Time: 10/29/21  7:25 AM   Specimen: Nasopharyngeal Swab; Nasopharyngeal(NP) swabs in vial transport medium  Result Value Ref Range Status   SARS Coronavirus 2 by RT PCR NEGATIVE NEGATIVE Final    Comment: (NOTE) SARS-CoV-2 target nucleic acids are NOT DETECTED.  The SARS-CoV-2 RNA is generally detectable in upper respiratory specimens during the acute phase of infection. The lowest concentration of SARS-CoV-2 viral copies this assay can detect is 138 copies/mL. A negative result does not preclude SARS-Cov-2 infection and should not be used as the sole basis for treatment or other patient management decisions. A negative result may occur with  improper specimen collection/handling, submission of specimen other than nasopharyngeal swab, presence of viral mutation(s) within the areas targeted by this assay, and inadequate number of viral copies(<138 copies/mL). A negative result must be combined with clinical observations, patient history, and epidemiological information. The expected result is Negative.  Fact Sheet for Patients:  EntrepreneurPulse.com.au  Fact Sheet for Healthcare Providers:  IncredibleEmployment.be  This test is no t yet approved or cleared by the [place] FDA and  has been authorized for detection and/or diagnosis of SARS-CoV-2 by FDA under an Emergency Use Authorization (EUA). This EUA will remain  in effect (meaning this test can be used) for the duration of the COVID-19 declaration under Section 564(b)(1) of the Act, 21 U.S.C.section 360bbb-3(b)(1), unless the authorization is terminated  or revoked sooner.        Influenza A by PCR NEGATIVE NEGATIVE Final   Influenza B by PCR NEGATIVE NEGATIVE Final    Comment: (NOTE) The Xpert Xpress SARS-CoV-2/FLU/RSV plus assay is intended as an aid in the diagnosis of influenza from Nasopharyngeal swab specimens and should not be used as a sole basis for treatment. Nasal washings and aspirates are unacceptable for Xpert Xpress SARS-CoV-2/FLU/RSV testing.  Fact Sheet for Patients: EntrepreneurPulse.com.au  Fact Sheet for Healthcare Providers: IncredibleEmployment.be  This test is not yet approved or cleared by the [place] FDA and has been authorized for detection and/or diagnosis of SARS-CoV-2 by FDA under an Emergency Use Authorization (EUA). This EUA will remain in effect (meaning this test can be used) for the duration of the COVID-19 declaration under Section 564(b)(1) of the Act, 21 U.S.C. section 360bbb-3(b)(1), unless the authorization is terminated or revoked.  Performed at [facility], [address] [place], [place] [address]          Radiology Studies: CT HEAD WO CONTRAST (5MM)  Result Date: 10/29/2021 CLINICAL DATA:  Altered mental status.  Abdominal pain. EXAM: CT HEAD WITHOUT CONTRAST TECHNIQUE: Contiguous axial images were obtained from the base of the skull through the vertex without intravenous contrast. COMPARISON:  02/12/2021. FINDINGS: Brain: No evidence of acute infarction, hemorrhage, hydrocephalus, extra-axial collection or mass lesion/mass effect. Vascular: No hyperdense vessel or unexpected calcification. Skull: Normal. Negative for fracture or focal lesion. Sinuses/Orbits: Visualized globes and orbits are unremarkable. Mild right frontal sinus mucosal thickening with some dependent fluid. Mild anterior right ethmoid sinus mucosal thickening. Scattered minor bilateral ethmoid sinus mucosal thickening and sphenoid sinus mucosal thickening. Other: None. IMPRESSION: 1.  No intracranial abnormalities. 2. Mild sinus mucosal thickening. Electronically Signed   By: Lajean Manes M.D.   On: 10/29/2021 15:52   CT ABDOMEN PELVIS W CONTRAST  Result Date: 10/29/2021 CLINICAL DATA:  Abdominal pain EXAM: CT ABDOMEN AND PELVIS WITH CONTRAST TECHNIQUE: Multidetector CT imaging of the abdomen and pelvis was performed using the standard protocol following bolus administration of intravenous contrast. CONTRAST:  170mL OMNIPAQUE IOHEXOL 300 MG/ML  SOLN COMPARISON:  10/04/2020 FINDINGS: Lower chest: Unremarkable. Hepatobiliary: No suspicious focal abnormality within the liver parenchyma. There is no evidence for gallstones, gallbladder wall thickening, or pericholecystic fluid. No intrahepatic or extrahepatic biliary dilation. Pancreas: No focal mass lesion. No dilatation of the main duct. No intraparenchymal cyst. No peripancreatic edema. Spleen: No splenomegaly. No focal mass lesion. Adrenals/Urinary Tract: No adrenal nodule or mass. Kidneys unremarkable. No evidence for hydroureter. The urinary bladder appears normal for the degree of distention. Stomach/Bowel: Stomach is unremarkable. No gastric wall thickening. No evidence of outlet obstruction. Duodenum is normally positioned as is the ligament of Treitz. Mild duodenal distension. Diffuse fluids no small bowel dilatation noted from the ligament of Treitz to the terminal ileum. Cecum is distended and fluid-filled. Circumferential "apple-core" lesion identified in the ascending colon about 5-6 cm distal to the ileocecal valve. Colon distal to this lesion is largely decompressed although there is some  gas and stool in the sigmoid colon and rectum. Vascular/Lymphatic: There is moderate atherosclerotic calcification of the abdominal aorta without aneurysm. There is no gastrohepatic or hepatoduodenal ligament lymphadenopathy. No para-aortic lymphadenopathy. 14 mm ileocolic lymph node on image 52/2 appears to have central necrosis. Adjacent 2.1  cm short axis lymph node visible on image 51/2. Other upper normal to borderline enlarged lymph nodes are seen in the ileocolic mesentery. No pelvic sidewall lymphadenopathy. Reproductive: The prostate gland and seminal vesicles are unremarkable. Other: No intraperitoneal free fluid. Musculoskeletal: No worrisome lytic or sclerotic osseous abnormality. IMPRESSION: 1. Obstructing circumferential apple-core lesion measuring approximately 6 cm in length is identified in the ascending colon, located about 5-6 cm distal to the ileocecal valve. 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 Lymphadenopathy is evident in the ileocolic mesentery, some of which shows central necrosis and is highly suspicious for metastatic disease. 2. Diffuse fluid-filled dilated small bowel from the ligament of Treitz to the terminal ileum measuring up to 3-4 cm diameter. Imaging features are compatible with obstruction. 3. Aortic Atherosclerosis (ICD10-I70.0). I discussed these findings by telephone with Dr. Kerman Passey at 0859 hours on 10/29/2021. Electronically Signed   By: Misty Stanley M.D.   On: 10/29/2021 09:02   ECHOCARDIOGRAM COMPLETE  Result Date: 10/30/2021    ECHOCARDIOGRAM REPORT   Patient Name:   Tesla LAMONT Standish Date of Exam: 10/29/2021 Medical Rec #:  [id number]             Height:       70.0 in Accession #:    [id number]            Weight:       185.0 lb Date of Birth:  1971-09-23            BSA:          2.019 m Patient Age:    73 years              BP:           108/75 mmHg Patient Gender: M                     HR:           93 bpm. Exam Location:  [facility] Procedure: 2D Echo, Cardiac Doppler, Color Doppler and Intracardiac            Opacification Agent Indications:     H82.99 Acute Systolic Heart Failure  History:         Patient has prior history of Echocardiogram examinations, most                  recent 07/16/2020. CHF; Risk Factors:Hypertension and Diabetes.  Sonographer:     Cresenciano Lick RDCS Referring Phys:  [place] Diagnosing Phys: Ida Rogue MD IMPRESSIONS  1. Left ventricular ejection fraction, by estimation, is 25 to 30%. The left ventricle has severely decreased function. The left ventricle demonstrates moderate global hypokinesis with severe hypokinesis of teh anterior/anteroseptal and apical regions. The left ventricular internal cavity size was moderately dilated. Left ventricular diastolic parameters are consistent with Grade II diastolic dysfunction (pseudonormalization).  2. Right ventricular systolic function is normal. The right ventricular size is normal.  3. Left atrial size was moderately dilated.  4. The mitral valve is normal in structure. Mild to moderate mitral valve regurgitation. No evidence of mitral stenosis.  5. The aortic valve is normal in structure. Aortic valve regurgitation is not visualized. No  aortic stenosis is present.  6. The inferior vena cava is normal in size with greater than 50% respiratory variability, suggesting right atrial pressure of 3 mmHg. FINDINGS  Left Ventricle: Left ventricular ejection fraction, by estimation, is 25 to 30%. The left ventricle has severely decreased function. The left ventricle demonstrates global hypokinesis. Definity contrast agent was given IV to delineate the left ventricular endocardial borders. The left ventricular internal cavity size was moderately dilated. There is no left ventricular hypertrophy. Left ventricular diastolic parameters are consistent with Grade II diastolic dysfunction (pseudonormalization). Right Ventricle: The right ventricular size is normal. No increase in right ventricular wall thickness. Right ventricular systolic function is normal. Left Atrium: Left atrial size was moderately dilated. Right Atrium: Right atrial size was normal in size. Pericardium: There is no evidence of pericardial effusion. Mitral Valve: The mitral valve is normal in structure. Mild to moderate  mitral valve regurgitation, with eccentric laterally directed jet. No evidence of mitral valve stenosis. Tricuspid Valve: The tricuspid valve is normal in structure. Tricuspid valve regurgitation is mild . No evidence of tricuspid stenosis. Aortic Valve: The aortic valve is normal in structure. Aortic valve regurgitation is not visualized. No aortic stenosis is present. Pulmonic Valve: The pulmonic valve was normal in structure. Pulmonic valve regurgitation is not visualized. No evidence of pulmonic stenosis. Aorta: The aortic root is normal in size and structure. Venous: The inferior vena cava is normal in size with greater than 50% respiratory variability, suggesting right atrial pressure of 3 mmHg. IAS/Shunts: No atrial level shunt detected by color flow Doppler.  LEFT VENTRICLE PLAX 2D LVIDd:         6.00 cm   Diastology LVIDs:         4.30 cm   LV e' medial:    6.64 cm/s LV PW:         0.90 cm   LV E/e' medial:  17.6 LV IVS:        0.90 cm   LV e' lateral:   6.31 cm/s LVOT diam:     2.00 cm   LV E/e' lateral: 18.5 LV SV:         33 LV SV Index:   17 LVOT Area:     3.14 cm  RIGHT VENTRICLE RV Basal diam:  4.20 cm RV S prime:     12.20 cm/s TAPSE (M-mode): 2.1 cm LEFT ATRIUM             Index        RIGHT ATRIUM           Index LA diam:        4.90 cm 2.43 cm/m   RA Area:     14.90 cm LA Vol (A2C):   56.8 ml 28.13 ml/m  RA Volume:   39.70 ml  19.66 ml/m LA Vol (A4C):   49.0 ml 24.27 ml/m LA Biplane Vol: 56.2 ml 27.83 ml/m  AORTIC VALVE LVOT Vmax:   67.15 cm/s LVOT Vmean:  49.750 cm/s LVOT VTI:    0.107 m  AORTA Ao Root diam: 3.60 cm Ao Asc diam:  3.40 cm MITRAL VALVE MV Area (PHT): 3.42 cm     SHUNTS MV Decel Time: 222 msec     Systemic VTI:  0.11 m MV E velocity: 117.00 cm/s  Systemic Diam: 2.00 cm MV A velocity: 109.00 cm/s MV E/A ratio:  1.07 Ida Rogue MD Electronically signed by Ida Rogue MD Signature Date/Time: 10/30/2021/8:01:03 AM    Final  Scheduled Meds:  carvedilol  3.125  mg Oral BID WC   docusate sodium  100 mg Oral BID   enoxaparin (LOVENOX) injection  40 mg Subcutaneous Q24H   insulin aspart  0-5 Units Subcutaneous QHS   insulin aspart  0-9 Units Subcutaneous TID WC   spironolactone  25 mg Oral Daily   Continuous Infusions:  sodium chloride 50 mL/hr at 10/29/21 1613     LOS: 1 day   Time spent= 35 mins    Hydie Langan Arsenio Loader, MD [facility]  If 7PM-7AM, please contact night-coverage  10/30/2021, 8:31 AM

## 2021-10-30 NOTE — ED Notes (Signed)
Pt provided chicken broth, and water at this time.

## 2021-10-31 ENCOUNTER — Encounter
Admission: EM | Discharge status: Against Medical Advice | Payer: Self-pay | Source: Home / Self Care | Attending: Internal Medicine

## 2021-10-31 ENCOUNTER — Inpatient Hospital Stay: Payer: Medicaid Other

## 2021-10-31 ENCOUNTER — Telehealth: Payer: Self-pay | Admitting: Emergency Medicine

## 2021-10-31 DIAGNOSIS — G9341 Metabolic encephalopathy: Principal | ICD-10-CM

## 2021-10-31 DIAGNOSIS — K6389 Other specified diseases of intestine: Secondary | ICD-10-CM | POA: Diagnosis not present

## 2021-10-31 DIAGNOSIS — K56609 Unspecified intestinal obstruction, unspecified as to partial versus complete obstruction: Secondary | ICD-10-CM | POA: Diagnosis not present

## 2021-10-31 DIAGNOSIS — F1910 Other psychoactive substance abuse, uncomplicated: Secondary | ICD-10-CM

## 2021-10-31 DIAGNOSIS — I5022 Chronic systolic (congestive) heart failure: Secondary | ICD-10-CM

## 2021-10-31 DIAGNOSIS — D500 Iron deficiency anemia secondary to blood loss (chronic): Secondary | ICD-10-CM | POA: Diagnosis not present

## 2021-10-31 LAB — COMPREHENSIVE METABOLIC PANEL
ALT: 7 U/L (ref 0–44)
AST: 11 U/L — ABNORMAL LOW (ref 15–41)
Albumin: 2.8 g/dL — ABNORMAL LOW (ref 3.5–5.0)
Alkaline Phosphatase: 67 U/L (ref 38–126)
Anion gap: 6 (ref 5–15)
BUN: 9 mg/dL (ref 6–20)
CO2: 25 mmol/L (ref 22–32)
Calcium: 8 mg/dL — ABNORMAL LOW (ref 8.9–10.3)
Chloride: 105 mmol/L (ref 98–111)
Creatinine, Ser: 1.12 mg/dL (ref 0.61–1.24)
GFR, Estimated: >60 mL/min (ref >60)
Glucose, Bld: 127 mg/dL — ABNORMAL HIGH (ref 70–99)
Potassium: 4.1 mmol/L (ref 3.5–5.1)
Sodium: 136 mmol/L (ref 135–145)
Total Bilirubin: 0.5 mg/dL (ref 0.3–1.2)
Total Protein: 6.4 g/dL — ABNORMAL LOW (ref 6.5–8.1)

## 2021-10-31 LAB — FOLATE RBC
Folate, Hemolysate: 415 ng/mL
Folate, RBC: 1365 ng/mL (ref >498)
Hematocrit: 30.4 % — ABNORMAL LOW (ref 37.5–51.0)

## 2021-10-31 LAB — CBC
HCT: 24.5 % — ABNORMAL LOW (ref 39.0–52.0)
Hemoglobin: 7 g/dL — ABNORMAL LOW (ref 13.0–17.0)
MCH: 20.8 pg — ABNORMAL LOW (ref 26.0–34.0)
MCHC: 28.6 g/dL — ABNORMAL LOW (ref 30.0–36.0)
MCV: 72.7 fL — ABNORMAL LOW (ref 80.0–100.0)
Platelets: 267 10*3/uL (ref 150–400)
RBC: 3.37 MIL/uL — ABNORMAL LOW (ref 4.22–5.81)
RDW: 16.6 % — ABNORMAL HIGH (ref 11.5–15.5)
WBC: 6.2 10*3/uL (ref 4.0–10.5)
nRBC: 0 % (ref 0.0–0.2)

## 2021-10-31 LAB — GLUCOSE, CAPILLARY
Glucose-Capillary: 298 mg/dL — ABNORMAL HIGH (ref 70–99)
Glucose-Capillary: 106 mg/dL — ABNORMAL HIGH (ref 70–99)
Glucose-Capillary: 158 mg/dL — ABNORMAL HIGH (ref 70–99)

## 2021-10-31 LAB — MAGNESIUM: Magnesium: 2 mg/dL (ref 1.7–2.4)

## 2021-10-31 IMAGING — CT CT CHEST W/ CM
2 of 4 series · 15 of 36 positions shown, 18 images · IV contrast (omnipaque)
Comparison: None.

CLINICAL DATA: Gastrointestinal cancer.  Staging.

EXAM:
CT CHEST WITH CONTRAST
TECHNIQUE: Multidetector CT imaging of the chest was performed during
intravenous contrast administration.
CONTRAST:  80mL OMNIPAQUE IOHEXOL 350 MG/ML SOLN

[Series 2: axial st · axial · 0.74mm/px · z∈[-227,+15]mm · 12 of 144 slices shown, 15 images]
[im 12/144  mediastinal]
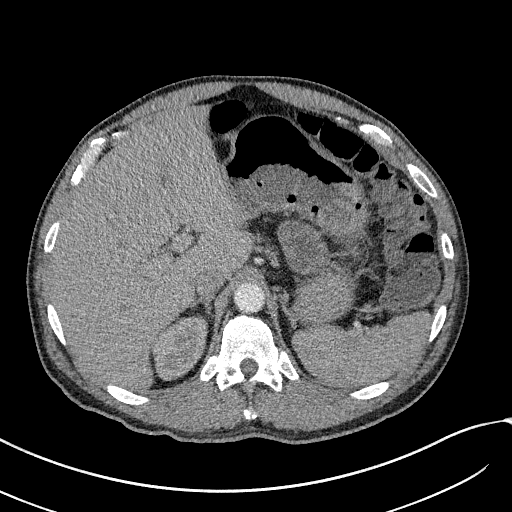
[im 12/144  lung]
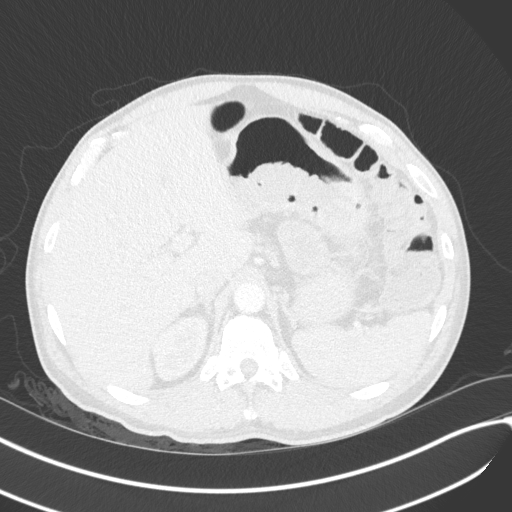
[im 23/144  lung]
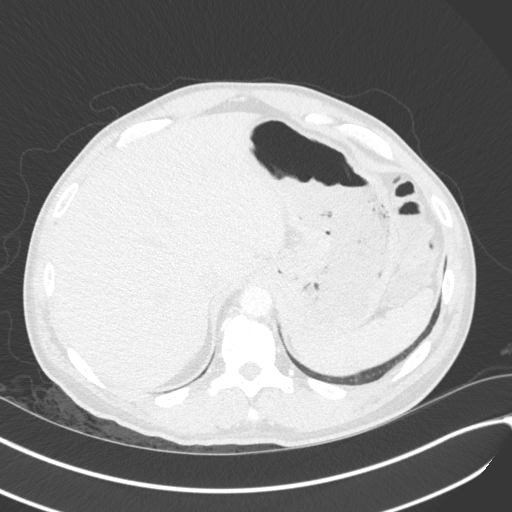
[im 34/144  lung]
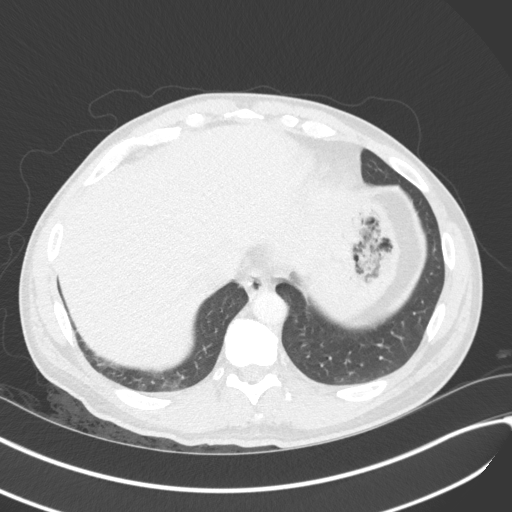
[im 45/144  lung]
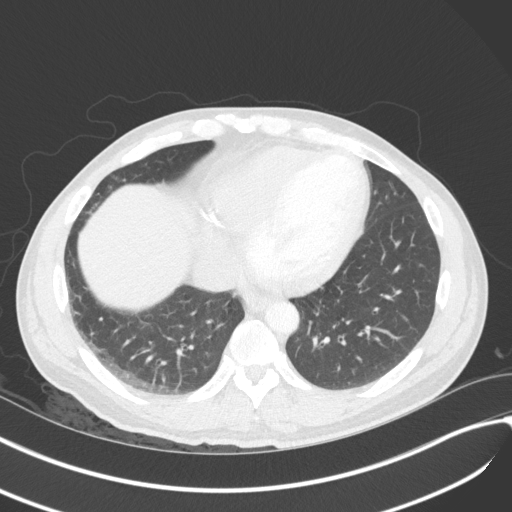
[im 56/144  mediastinal]
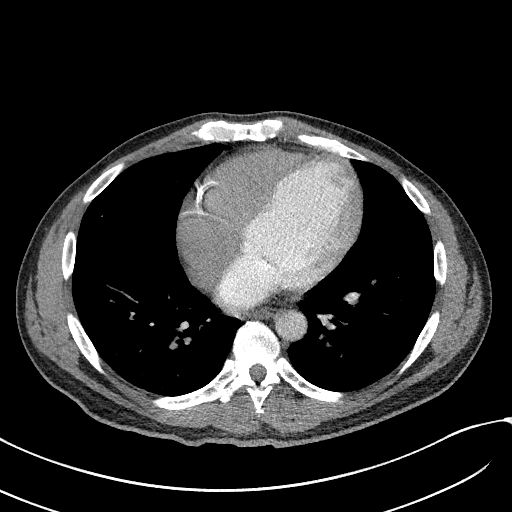
[im 56/144  lung]
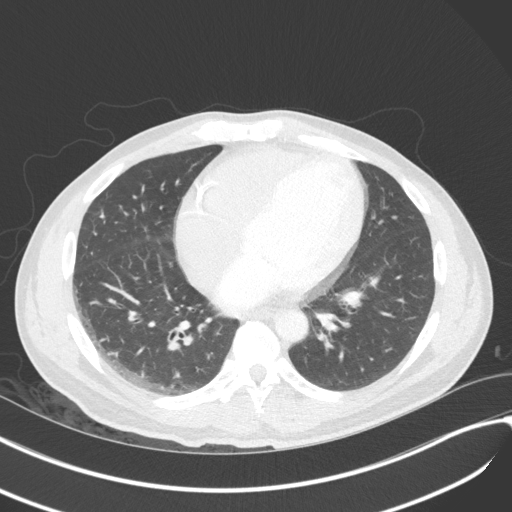
[im 67/144  lung]
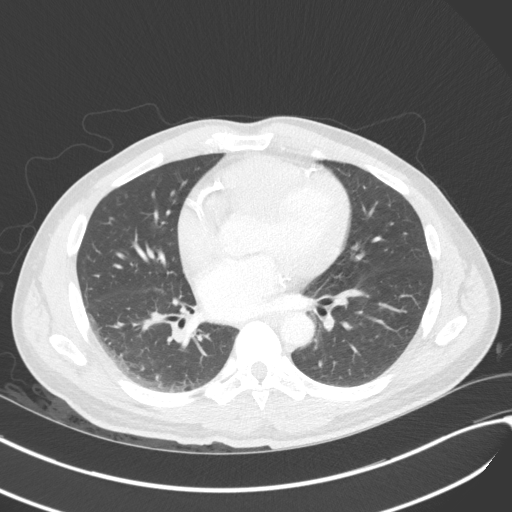
[im 78/144  lung]
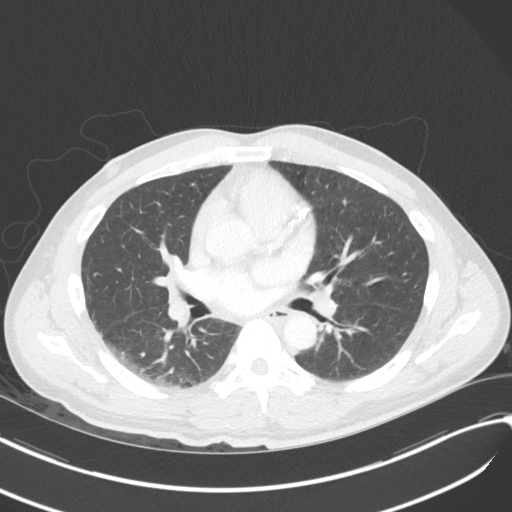
[im 89/144  lung]
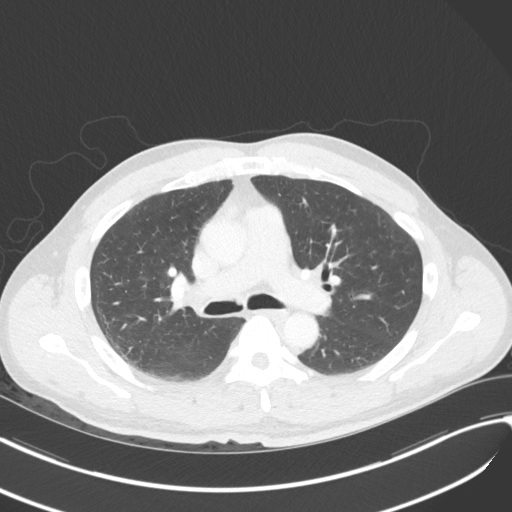
[im 100/144  mediastinal]
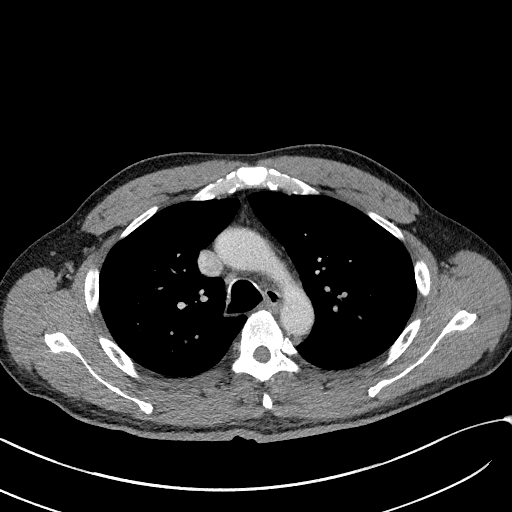
[im 100/144  lung]
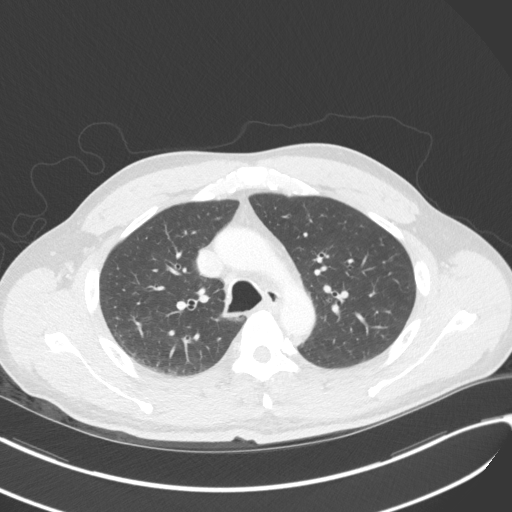
[im 111/144  lung]
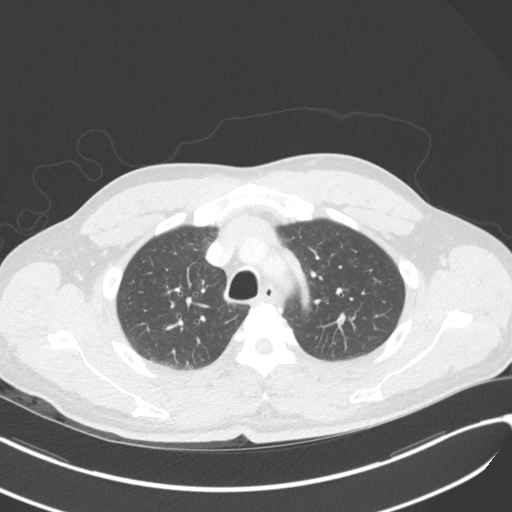
[im 122/144  lung]
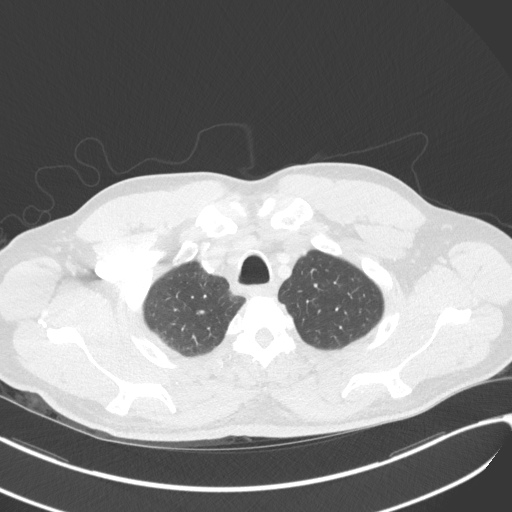
[im 133/144  lung]
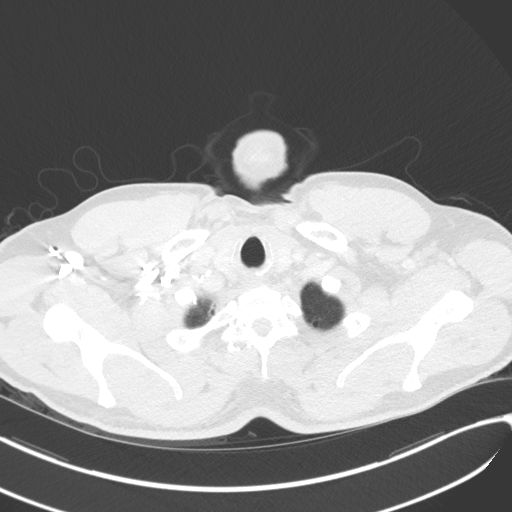

[Series 5: coronal · coronal · 0.59mm/px · 3 of 142 slices shown]
[im 29/142  lung]
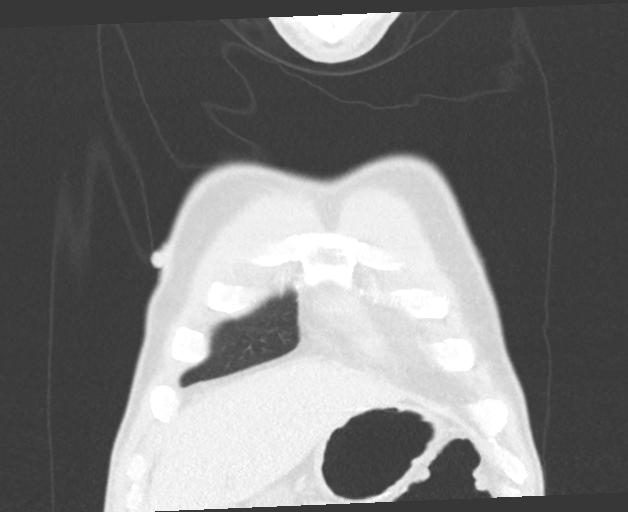
[im 57/142  lung]
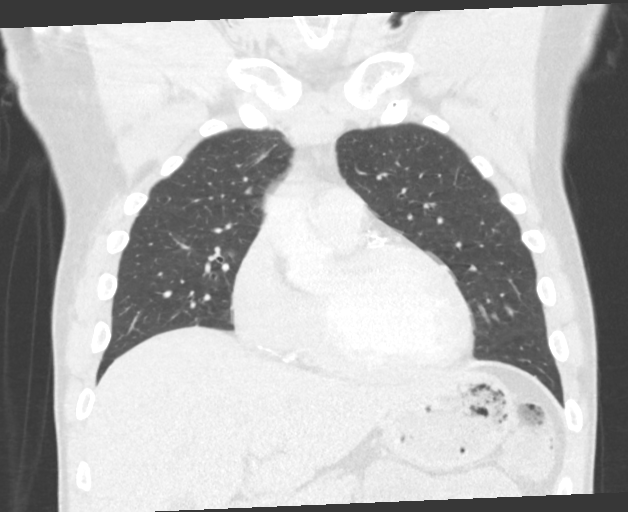
[im 85/142  lung]
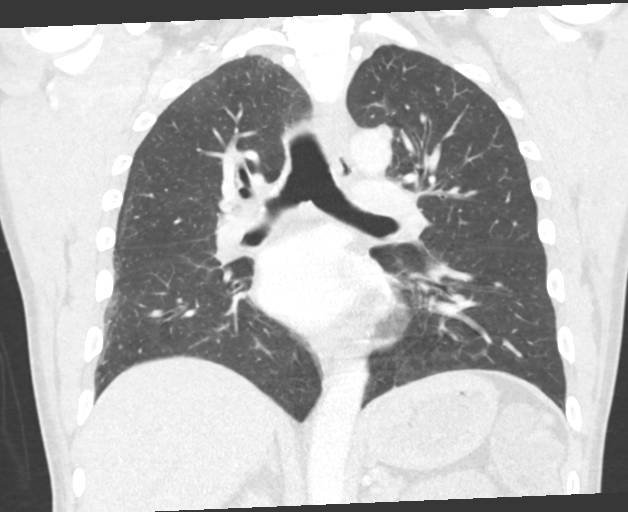

[15 of 36 positions shown; findings below may reference images not displayed]

FINDINGS: Cardiovascular: The thoracic aorta is normal in caliber with no
dissection or significant atherosclerotic change. Previous CABG. The
heart is unremarkable. Central pulmonary arteries are normal.

Mediastinum/Nodes: No enlarged mediastinal, hilar, or axillary lymph
nodes. Thyroid gland, trachea, and esophagus demonstrate no
significant findings.

Lungs/Pleura: Lungs are clear. No pleural effusion or pneumothorax.

Upper Abdomen: The abdomen was better assessed on yesterday's CT
scan. The patient has known small bowel obstruction was better
visualized yesterday. CS heard a CT scan of the abdomen and pelvis.

Musculoskeletal: No chest wall abnormality. No acute or significant
osseous findings.
IMPRESSION: 1. No metastatic disease in the chest.
2. Previous CABG.
3. Persistent small-bowel obstruction per the scout view. This was
better assessed with the recent KUB and CT scan.

## 2021-10-31 SURGERY — LAPAROTOMY, EXPLORATORY
Anesthesia: General

## 2021-10-31 MED ORDER — SODIUM CHLORIDE 0.9 % IV SOLN
250.0000 mg | Freq: Every day | INTRAVENOUS | Status: AC
  Administered 2021-10-31 – 2021-11-01 (×2): 250 mg via INTRAVENOUS
  Filled 2021-10-31 (×2): qty 20

## 2021-10-31 MED ORDER — VITAMIN D (ERGOCALCIFEROL) 1.25 MG (50000 UNIT) PO CAPS
50000.0000 [IU] | ORAL_CAPSULE | ORAL | Status: DC
  Administered 2021-10-31: 50000 [IU] via ORAL
  Filled 2021-10-31: qty 1

## 2021-10-31 MED ORDER — IOHEXOL 350 MG/ML SOLN
80.0000 mL | Freq: Once | INTRAVENOUS | Status: AC | PRN
  Administered 2021-10-31: 80 mL via INTRAVENOUS

## 2021-10-31 MED ORDER — ENSURE MAX PROTEIN PO LIQD
11.0000 [oz_av] | Freq: Two times a day (BID) | ORAL | Status: DC
  Administered 2021-11-01 – 2021-11-02 (×3): 11 [oz_av] via ORAL
  Filled 2021-10-31: qty 330

## 2021-10-31 MED ORDER — ADULT MULTIVITAMIN W/MINERALS CH
1.0000 | ORAL_TABLET | Freq: Every day | ORAL | Status: DC
  Administered 2021-11-01 – 2021-11-02 (×2): 1 via ORAL
  Filled 2021-10-31 (×2): qty 1

## 2021-10-31 SURGICAL SUPPLY — 52 items
APPLIER CLIP 11 MED OPEN (CLIP)
APPLIER CLIP 13 LRG OPEN (CLIP)
BLADE CLIPPER SURG (BLADE) IMPLANT
CHLORAPREP W/TINT 26 (MISCELLANEOUS) ×2 IMPLANT
CLIP APPLIE 11 MED OPEN (CLIP) IMPLANT
CLIP APPLIE 13 LRG OPEN (CLIP) IMPLANT
CLIP VESOCCLUDE LG 6/CT (CLIP) IMPLANT
CLIP VESOCCLUDE MED 6/CT (CLIP) IMPLANT
COVER BACK TABLE REUSABLE LG (DRAPES) ×2 IMPLANT
DRAPE INCISE IOBAN 66X45 STRL (DRAPES) ×2 IMPLANT
DRAPE LAPAROTOMY 100X77 ABD (DRAPES) ×2 IMPLANT
DRSG OPSITE POSTOP 4X10 (GAUZE/BANDAGES/DRESSINGS) IMPLANT
DRSG OPSITE POSTOP 4X8 (GAUZE/BANDAGES/DRESSINGS) IMPLANT
ELECT BLADE 6.5 EXT (BLADE) ×2 IMPLANT
ELECT CAUTERY BLADE 6.4 (BLADE) ×2 IMPLANT
ELECT CAUTERY BLADE TIP 2.5 (TIP) ×2
ELECT EZSTD 165MM 6.5IN (MISCELLANEOUS) ×2
ELECT REM PT RETURN 9FT ADLT (ELECTROSURGICAL) ×2
ELECTRODE CAUTERY BLDE TIP 2.5 (TIP) ×1 IMPLANT
ELECTRODE EZSTD 165MM 6.5IN (MISCELLANEOUS) ×1 IMPLANT
ELECTRODE REM PT RTRN 9FT ADLT (ELECTROSURGICAL) ×1 IMPLANT
GAUZE 4X4 16PLY ~~LOC~~+RFID DBL (SPONGE) ×2 IMPLANT
GLOVE SURG ORTHO LTX SZ7.5 (GLOVE) ×8 IMPLANT
GOWN STRL REUS W/ TWL LRG LVL3 (GOWN DISPOSABLE) ×6 IMPLANT
GOWN STRL REUS W/TWL LRG LVL3 (GOWN DISPOSABLE) ×6
HANDLE YANKAUER SUCT BULB TIP (MISCELLANEOUS) IMPLANT
KIT TURNOVER KIT A (KITS) ×2 IMPLANT
LIGASURE IMPACT 36 18CM CVD LR (INSTRUMENTS) ×2 IMPLANT
MANIFOLD NEPTUNE II (INSTRUMENTS) ×2 IMPLANT
NS IRRIG 1000ML POUR BTL (IV SOLUTION) ×2 IMPLANT
PACK BASIN MAJOR ARMC (MISCELLANEOUS) ×2 IMPLANT
PACK COLON CLEAN CLOSURE (MISCELLANEOUS) ×2 IMPLANT
RELOAD PROXIMATE 75MM BLUE (ENDOMECHANICALS) IMPLANT
RELOAD STAPLER LINEAR PROX 30 (STAPLE) ×1 IMPLANT
SPONGE T-LAP 18X18 ~~LOC~~+RFID (SPONGE) ×8 IMPLANT
STAPLER GUN LINEAR PROX 60 (STAPLE) ×2 IMPLANT
STAPLER PROXIMATE 75MM BLUE (STAPLE) ×2 IMPLANT
STAPLER RELOAD LINEAR PROX 30 (STAPLE) ×2
STAPLER SKIN PROX 35W (STAPLE) ×2 IMPLANT
SUT CHROMIC 2 0 SH (SUTURE) IMPLANT
SUT PDS AB 0 CT1 27 (SUTURE) ×4 IMPLANT
SUT PDS AB 1 TP1 96 (SUTURE) IMPLANT
SUT SILK 2 0 (SUTURE) ×1
SUT SILK 2-0 30XBRD TIE 12 (SUTURE) ×1 IMPLANT
SUT SILK 3-0 (SUTURE) ×2 IMPLANT
SUT VIC AB 3-0 SH 27 (SUTURE) ×2
SUT VIC AB 3-0 SH 27X BRD (SUTURE) ×2 IMPLANT
SUT VICRYL 2-0 54IN ABS (SUTURE) IMPLANT
SYR BULB IRRIG 60ML STRL (SYRINGE) ×2 IMPLANT
TRAY FOLEY MTR SLVR 16FR STAT (SET/KITS/TRAYS/PACK) ×2 IMPLANT
TRAY FOLEY SLVR 16FR LF STAT (SET/KITS/TRAYS/PACK) ×2 IMPLANT
WATER STERILE IRR 500ML POUR (IV SOLUTION) ×2 IMPLANT

## 2021-10-31 NOTE — Progress Notes (Addendum)
Progress Note Patient came to the nurse desk asking to have another discussion. Myself and RN went into the room. I again explained to him that his UDS, which was obtained yesterday (11/17), was positive for cocaine and this puts him at an increased risk for cardiac complications from anesthesia. He denied using cocaine but understands the situation. He understands that he would need to retest before he could undergo anesthesia, but he is still refusing to do this. He understands that he has two options: which would be to remain in the hospital until he can safely undergo surgery or sign out of the hospital against medical advice. His biggest concern is whether or not he can eat today. He did admit to me that last night he ate chicken and masked potatoes which were brought to him from outside the hospital. He did not attempt any degree of bowel prep either. After a lengthy discussion with him and a friend at bedside, I do believe he has agreed to remain in the hospital. He understands that we can not do surgery safely at this time, and the earliest we could consider intervening would be next week. He is non-toxic, nor peritonitic to warrant any emergent intervention. He understands that without surgery his obstruction will likely continue to worsen over time. I believe that most of his hesitations and frustrations are related to previous bad experiences with hospitals in general and he voices "frustrations with his own failures."  For now: - I will trial him on soft diet, as he had done this independently last night. He understands that should he develop any degree of nausea/emesis, he needs to alert staff immediately - Will need repeat UDS at some point over the weekend - We can repeat serial abdominal XRs as this seems to help him understand the situation - I did also educate him that if he were to leave, he would need to sign out against medical advice - He understands Dr Lysle Pearl is covering for surgery  over the weekend.  -- Edison Simon, PA-C [facility] 10/31/2021, 9:01 AM [phone] M-F: 7am - 4pm

## 2021-10-31 NOTE — Progress Notes (Signed)
PROGRESS NOTE    Braylee Bosher  ZGY:[id number] DOB: 03-09-1971 DOA: 10/29/2021 PCP: [facility]   Brief Narrative:  50 year old with history of chronic systolic CHF, DM2, HTN, substance abuse, tobacco use came to the ED with complaints of 2 weeks of ongoing abdominal pain.  Work-up in the ED on the CT scan showed obstructing circumferential apple core lesion in the ascending colon consistent with colorectal neoplasm with lymphadenopathy, central necrosis and likely metastatic disease.  General surgery was consulted and recommending patient will need and benefit from right-sided hemicolectomy to relieve the obstruction.  Due to consenting issue, psychiatry was consulted.   Assessment & Plan:   Principal Problem:   Colonic obstruction (HCC) Active Problems:   Acute metabolic encephalopathy   HTN (hypertension)   Type 2 diabetes mellitus with complications (HCC)   Tobacco abuse   Polysubstance abuse (HCC)   Iron deficiency anemia due to chronic blood loss  Complete obstruction of small bowel secondary to primary colorectal malignancy - CT scan is concerning for primary colorectal malignancy with mets.  Patient is refusing NG tube.  Patient is not having any vomiting Or symptoms.  General surgery would like to proceed with right-sided hemicolectomy with patient is hesitant at this time. It appears that his surgery might have to be delayed until his UDS clears up.  Will order repeat UDS on Sunday.  Primary colorectal malignancy with metastasis, presumed - CEA levels-11.1.  Oncology consulted  Anemia of chronic disease, microcytic Severe iron deficiency -IV iron ordered.  Hemoglobin today 7.0, and if he does proceed with surgery he will benefit from 1 unit of PRBC transfusion  Hyponatremia Acute kidney injury 1.52 > 1.12 - Resolved with IV fluids  Diabetes mellitus type 2, uncontrolled due to hyperglycemia - On sliding scale and Accu-Chek.  Lantus 10 units  at bedtime - A1c 8.5  Essential hypertension - Coreg  Chronic congestive heart failure with reduced ejection fraction, EF 25% - Continue Coreg.  Lasix and losartan on hold.  Cardiology input appreciated - Repeat echocardiogram - ef 25-30%, g2DD  Polysubstance abuse - Amphetamine, cocaine, opioids, THC.  Counseled to quit using this   DVT prophylaxis: Lovenox Code Status: Full code Family Communication:   Status is: Inpatient  Remains inpatient appropriate because:  Subjective: Patient seen and examined at bedside, he does have any complaints.  He tells me that he did not refuse surgery this morning but due to cocaine in his system it has to be postponed.  He is willing to give more urine samples as necessary to ensure clearance.  Review of Systems Otherwise negative except as per HPI, including: General: Denies fever, chills, night sweats or unintended weight loss. Resp: Denies cough, wheezing, shortness of breath. Cardiac: Denies chest pain, palpitations, orthopnea, paroxysmal nocturnal dyspnea. GI: Denies abdominal pain, nausea, vomiting, diarrhea or constipation GU: Denies dysuria, frequency, hesitancy or incontinence MS: Denies muscle aches, joint pain or swelling Neuro: Denies headache, neurologic deficits (focal weakness, numbness, tingling), abnormal gait Psych: Denies anxiety, depression, SI/HI/AVH Skin: Denies new rashes or lesions ID: Denies sick contacts, exotic exposures, travel Examination:  Constitutional: Not in acute distress Respiratory: Clear to auscultation bilaterally Cardiovascular: Normal sinus rhythm, no rubs Abdomen: Nontender nondistended good bowel sounds Musculoskeletal: No edema noted Skin: No rashes seen Neurologic: CN 2-12 grossly intact.  And nonfocal Psychiatric: Normal judgment and insight. Alert and oriented x 3. Normal mood.   Objective: Vitals:   10/30/21 1603 10/30/21 1618 10/30/21 1937 10/31/21 0520  BP:  120/80 (!) 123/94  115/76 116/83  Pulse: 94 (!) 101 99 97  Resp: 16 16 16 20   Temp: 98.2 F (36.8 C)  98.1 F (36.7 C) 98.2 F (36.8 C)  TempSrc: Oral  Oral Oral  SpO2: 98% 100% 100% 100%  Weight:      Height:        Intake/Output Summary (Last 24 hours) at 10/31/2021 0746 Last data filed at 10/31/2021 0500 Gross per 24 hour  Intake 840 ml  Output 0 ml  Net 840 ml   Filed Weights   10/29/21 0710  Weight: 83.9 kg     Data Reviewed:   CBC: Recent Labs  Lab 10/29/21 0710 10/30/21 0624 10/31/21 0431  WBC 14.2* 5.9 6.2  HGB 9.8* 7.5* 7.0*  HCT 33.6* 25.7* 24.5*  MCV 72.1* 71.8* 72.7*  PLT 389 276 130   Basic Metabolic Panel: Recent Labs  Lab 10/29/21 0710 10/30/21 0624 10/31/21 0431  NA 131* 133* 136  K 4.2 4.0 4.1  CL 95* 100 105  CO2 26 24 25   GLUCOSE 270* 176* 127*  BUN 15 13 9   CREATININE 1.52* 1.04 1.12  CALCIUM 9.5 7.7* 8.0*  MG  --  2.0 2.0  PHOS  --  3.4  --    GFR: Estimated Creatinine Clearance: 81.5 mL/min (by C-G formula based on SCr of 1.12 mg/dL). Liver Function Tests: Recent Labs  Lab 10/29/21 0710 10/30/21 0624 10/31/21 0431  AST 16 10* 11*  ALT 8 6 7   ALKPHOS 100 71 67  BILITOT 0.5 0.5 0.5  PROT 8.6* 6.5 6.4*  ALBUMIN 3.6 2.6* 2.8*   Recent Labs  Lab 10/29/21 0710  LIPASE 37   Recent Labs  Lab 10/29/21 0955  AMMONIA 28   Coagulation Profile: No results for input(s): INR, PROTIME in the last 168 hours. Cardiac Enzymes: No results for input(s): CKTOTAL, CKMB, CKMBINDEX, TROPONINI in the last 168 hours. BNP (last 3 results) No results for input(s): PROBNP in the last 8760 hours. HbA1C: Recent Labs    10/29/21 0710  HGBA1C 8.5*   CBG: Recent Labs  Lab 10/29/21 1607 10/30/21 0938 10/30/21 1323 10/30/21 1707 10/30/21 2126  GLUCAP 138* 191* 217* 120* 247*   Lipid Profile: No results for input(s): CHOL, HDL, LDLCALC, TRIG, CHOLHDL, LDLDIRECT in the last 72 hours. Thyroid Function Tests: No results for input(s): TSH, T4TOTAL,  FREET4, T3FREE, THYROIDAB in the last 72 hours. Anemia Panel: Recent Labs    10/30/21 1159  VITAMINB12 331  FERRITIN 7*  TIBC 374  IRON 16*   Sepsis Labs: No results for input(s): PROCALCITON, LATICACIDVEN in the last 168 hours.  Recent Results (from the past 240 hour(s))  Resp Panel by RT-PCR (Flu A&B, Covid) Nasopharyngeal Swab     Status: None   Collection Time: 10/29/21  7:25 AM   Specimen: Nasopharyngeal Swab; Nasopharyngeal(NP) swabs in vial transport medium  Result Value Ref Range Status   SARS Coronavirus 2 by RT PCR NEGATIVE NEGATIVE Final    Comment: (NOTE) SARS-CoV-2 target nucleic acids are NOT DETECTED.  The SARS-CoV-2 RNA is generally detectable in upper respiratory specimens during the acute phase of infection. The lowest concentration of SARS-CoV-2 viral copies this assay can detect is 138 copies/mL. A negative result does not preclude SARS-Cov-2 infection and should not be used as the sole basis for treatment or other patient management decisions. A negative result may occur with  improper specimen collection/handling, submission of specimen other than nasopharyngeal swab, presence of viral mutation(s)  within the areas targeted by this assay, and inadequate number of viral copies(<138 copies/mL). A negative result must be combined with clinical observations, patient history, and epidemiological information. The expected result is Negative.  Fact Sheet for Patients:  EntrepreneurPulse.com.au  Fact Sheet for Healthcare Providers:  IncredibleEmployment.be  This test is no t yet approved or cleared by the [place] FDA and  has been authorized for detection and/or diagnosis of SARS-CoV-2 by FDA under an Emergency Use Authorization (EUA). This EUA will remain  in effect (meaning this test can be used) for the duration of the COVID-19 declaration under Section 564(b)(1) of the Act, 21 U.S.C.section 360bbb-3(b)(1), unless  the authorization is terminated  or revoked sooner.       Influenza A by PCR NEGATIVE NEGATIVE Final   Influenza B by PCR NEGATIVE NEGATIVE Final    Comment: (NOTE) The Xpert Xpress SARS-CoV-2/FLU/RSV plus assay is intended as an aid in the diagnosis of influenza from Nasopharyngeal swab specimens and should not be used as a sole basis for treatment. Nasal washings and aspirates are unacceptable for Xpert Xpress SARS-CoV-2/FLU/RSV testing.  Fact Sheet for Patients: EntrepreneurPulse.com.au  Fact Sheet for Healthcare Providers: IncredibleEmployment.be  This test is not yet approved or cleared by the [place] FDA and has been authorized for detection and/or diagnosis of SARS-CoV-2 by FDA under an Emergency Use Authorization (EUA). This EUA will remain in effect (meaning this test can be used) for the duration of the COVID-19 declaration under Section 564(b)(1) of the Act, 21 U.S.C. section 360bbb-3(b)(1), unless the authorization is terminated or revoked.  Performed at [facility], [address] [place], [place] [address]          Radiology Studies: CT HEAD WO CONTRAST (5MM)  Result Date: 10/29/2021 CLINICAL DATA:  Altered mental status.  Abdominal pain. EXAM: CT HEAD WITHOUT CONTRAST TECHNIQUE: Contiguous axial images were obtained from the base of the skull through the vertex without intravenous contrast. COMPARISON:  02/12/2021. FINDINGS: Brain: No evidence of acute infarction, hemorrhage, hydrocephalus, extra-axial collection or mass lesion/mass effect. Vascular: No hyperdense vessel or unexpected calcification. Skull: Normal. Negative for fracture or focal lesion. Sinuses/Orbits: Visualized globes and orbits are unremarkable. Mild right frontal sinus mucosal thickening with some dependent fluid. Mild anterior right ethmoid sinus mucosal thickening. Scattered minor bilateral ethmoid sinus mucosal thickening and sphenoid  sinus mucosal thickening. Other: None. IMPRESSION: 1. No intracranial abnormalities. 2. Mild sinus mucosal thickening. Electronically Signed   By: Lajean Manes M.D.   On: 10/29/2021 15:52   CT ABDOMEN PELVIS W CONTRAST  Result Date: 10/29/2021 CLINICAL DATA:  Abdominal pain EXAM: CT ABDOMEN AND PELVIS WITH CONTRAST TECHNIQUE: Multidetector CT imaging of the abdomen and pelvis was performed using the standard protocol following bolus administration of intravenous contrast. CONTRAST:  111mL OMNIPAQUE IOHEXOL 300 MG/ML  SOLN COMPARISON:  10/04/2020 FINDINGS: Lower chest: Unremarkable. Hepatobiliary: No suspicious focal abnormality within the liver parenchyma. There is no evidence for gallstones, gallbladder wall thickening, or pericholecystic fluid. No intrahepatic or extrahepatic biliary dilation. Pancreas: No focal mass lesion. No dilatation of the main duct. No intraparenchymal cyst. No peripancreatic edema. Spleen: No splenomegaly. No focal mass lesion. Adrenals/Urinary Tract: No adrenal nodule or mass. Kidneys unremarkable. No evidence for hydroureter. The urinary bladder appears normal for the degree of distention. Stomach/Bowel: Stomach is unremarkable. No gastric wall thickening. No evidence of outlet obstruction. Duodenum is normally positioned as is the ligament of Treitz. Mild duodenal distension. Diffuse fluids no small bowel dilatation noted from  the ligament of Treitz to the terminal ileum. Cecum is distended and fluid-filled. Circumferential "apple-core" lesion identified in the ascending colon about 5-6 cm distal to the ileocecal valve. Colon distal to this lesion is largely decompressed although there is some gas and stool in the sigmoid colon and rectum. Vascular/Lymphatic: There is moderate atherosclerotic calcification of the abdominal aorta without aneurysm. There is no gastrohepatic or hepatoduodenal ligament lymphadenopathy. No para-aortic lymphadenopathy. 14 mm ileocolic lymph node on  image 52/2 appears to have central necrosis. Adjacent 2.1 cm short axis lymph node visible on image 51/2. Other upper normal to borderline enlarged lymph nodes are seen in the ileocolic mesentery. No pelvic sidewall lymphadenopathy. Reproductive: The prostate gland and seminal vesicles are unremarkable. Other: No intraperitoneal free fluid. Musculoskeletal: No worrisome lytic or sclerotic osseous abnormality. IMPRESSION: 1. Obstructing circumferential apple-core lesion measuring approximately 6 cm in length is identified in the ascending colon, located about 5-6 cm distal to the ileocecal valve. 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 Lymphadenopathy is evident in the ileocolic mesentery, some of which shows central necrosis and is highly suspicious for metastatic disease. 2. Diffuse fluid-filled dilated small bowel from the ligament of Treitz to the terminal ileum measuring up to 3-4 cm diameter. Imaging features are compatible with obstruction. 3. Aortic Atherosclerosis (ICD10-I70.0). I discussed these findings by telephone with Dr. Kerman Passey at 0859 hours on 10/29/2021. Electronically Signed   By: Misty Stanley M.D.   On: 10/29/2021 09:02   DG Abd 2 Views  Result Date: 10/30/2021 CLINICAL DATA:  Small-bowel obstruction EXAM: ABDOMEN - 2 VIEW COMPARISON:  CT 10/29/2021 FINDINGS: Persistent diffusely dilated loops of small bowel in the abdomen. There is no evidence of free intraperitoneal gas. No acute osseous abnormality. IMPRESSION: Persistent small bowel obstruction. Electronically Signed   By: Maurine Simmering M.D.   On: 10/30/2021 14:36   ECHOCARDIOGRAM COMPLETE  Result Date: 10/30/2021    ECHOCARDIOGRAM REPORT   Patient Name:   Osama LAMONT Magan Date of Exam: 10/29/2021 Medical Rec #:  [id number]             Height:       70.0 in Accession #:    [id number]            Weight:       185.0 lb Date of Birth:  05-07-1971            BSA:          2.019 m Patient Age:    89 years               BP:           108/75 mmHg Patient Gender: M                     HR:           93 bpm. Exam Location:  [facility] Procedure: 2D Echo, Cardiac Doppler, Color Doppler and Intracardiac            Opacification Agent Indications:     N46.27 Acute Systolic Heart Failure  History:         Patient has prior history of Echocardiogram examinations, most                  recent 07/16/2020. CHF; Risk Factors:Hypertension and Diabetes.  Sonographer:     Cresenciano Lick RDCS Referring Phys:  [place] Diagnosing Phys: Ida Rogue MD IMPRESSIONS  1. Left ventricular ejection fraction, by  estimation, is 25 to 30%. The left ventricle has severely decreased function. The left ventricle demonstrates moderate global hypokinesis with severe hypokinesis of teh anterior/anteroseptal and apical regions. The left ventricular internal cavity size was moderately dilated. Left ventricular diastolic parameters are consistent with Grade II diastolic dysfunction (pseudonormalization).  2. Right ventricular systolic function is normal. The right ventricular size is normal.  3. Left atrial size was moderately dilated.  4. The mitral valve is normal in structure. Mild to moderate mitral valve regurgitation. No evidence of mitral stenosis.  5. The aortic valve is normal in structure. Aortic valve regurgitation is not visualized. No aortic stenosis is present.  6. The inferior vena cava is normal in size with greater than 50% respiratory variability, suggesting right atrial pressure of 3 mmHg. FINDINGS  Left Ventricle: Left ventricular ejection fraction, by estimation, is 25 to 30%. The left ventricle has severely decreased function. The left ventricle demonstrates global hypokinesis. Definity contrast agent was given IV to delineate the left ventricular endocardial borders. The left ventricular internal cavity size was moderately dilated. There is no left ventricular hypertrophy. Left ventricular diastolic parameters are  consistent with Grade II diastolic dysfunction (pseudonormalization). Right Ventricle: The right ventricular size is normal. No increase in right ventricular wall thickness. Right ventricular systolic function is normal. Left Atrium: Left atrial size was moderately dilated. Right Atrium: Right atrial size was normal in size. Pericardium: There is no evidence of pericardial effusion. Mitral Valve: The mitral valve is normal in structure. Mild to moderate mitral valve regurgitation, with eccentric laterally directed jet. No evidence of mitral valve stenosis. Tricuspid Valve: The tricuspid valve is normal in structure. Tricuspid valve regurgitation is mild . No evidence of tricuspid stenosis. Aortic Valve: The aortic valve is normal in structure. Aortic valve regurgitation is not visualized. No aortic stenosis is present. Pulmonic Valve: The pulmonic valve was normal in structure. Pulmonic valve regurgitation is not visualized. No evidence of pulmonic stenosis. Aorta: The aortic root is normal in size and structure. Venous: The inferior vena cava is normal in size with greater than 50% respiratory variability, suggesting right atrial pressure of 3 mmHg. IAS/Shunts: No atrial level shunt detected by color flow Doppler.  LEFT VENTRICLE PLAX 2D LVIDd:         6.00 cm   Diastology LVIDs:         4.30 cm   LV e' medial:    6.64 cm/s LV PW:         0.90 cm   LV E/e' medial:  17.6 LV IVS:        0.90 cm   LV e' lateral:   6.31 cm/s LVOT diam:     2.00 cm   LV E/e' lateral: 18.5 LV SV:         33 LV SV Index:   17 LVOT Area:     3.14 cm  RIGHT VENTRICLE RV Basal diam:  4.20 cm RV S prime:     12.20 cm/s TAPSE (M-mode): 2.1 cm LEFT ATRIUM             Index        RIGHT ATRIUM           Index LA diam:        4.90 cm 2.43 cm/m   RA Area:     14.90 cm LA Vol (A2C):   56.8 ml 28.13 ml/m  RA Volume:   39.70 ml  19.66 ml/m LA Vol (A4C):   49.0 ml  24.27 ml/m LA Biplane Vol: 56.2 ml 27.83 ml/m  AORTIC VALVE LVOT Vmax:   67.15  cm/s LVOT Vmean:  49.750 cm/s LVOT VTI:    0.107 m  AORTA Ao Root diam: 3.60 cm Ao Asc diam:  3.40 cm MITRAL VALVE MV Area (PHT): 3.42 cm     SHUNTS MV Decel Time: 222 msec     Systemic VTI:  0.11 m MV E velocity: 117.00 cm/s  Systemic Diam: 2.00 cm MV A velocity: 109.00 cm/s MV E/A ratio:  1.07 Ida Rogue MD Electronically signed by Ida Rogue MD Signature Date/Time: 10/30/2021/8:01:03 AM    Final         Scheduled Meds:  carvedilol  3.125 mg Oral BID WC   docusate sodium  100 mg Oral BID   enalapril  5 mg Oral Daily   enoxaparin (LOVENOX) injection  40 mg Subcutaneous Q24H   furosemide  20 mg Intravenous BID   insulin aspart  0-5 Units Subcutaneous QHS   insulin aspart  0-9 Units Subcutaneous TID WC   spironolactone  25 mg Oral Daily   Vitamin D (Ergocalciferol)  50,000 Units Oral Q7 days   Continuous Infusions:  dextrose 5 % and 0.45% NaCl Stopped (10/30/21 1423)   ferric gluconate (FERRLECIT) IVPB       LOS: 2 days   Time spent= 35 mins    Rashawn Rolon Arsenio Loader, MD [facility]  If 7PM-7AM, please contact night-coverage  10/31/2021, 7:46 AM

## 2021-10-31 NOTE — Progress Notes (Signed)
[facility] SURGICAL PROGRESS NOTE (cpt [id number])  Hospital Day(s): 2.   Interval History: Patient seen and examined, no acute events or new complaints overnight. Patient is alert and interactive this morning. He remains hesitant about the whole situation which is reasonable. I spent a lot of time with his this morning, as I did yesterday afternoon as well. He denies any abdominal pain, nausea, or emesis. He remains distended.  He has yet to sign consent, though this was thoroughly discussed and verbally approved by him yesterday.  I do not believe he attempted or completed any of the MiraLAX bowel prep yesterday; he denies nausea and vomiting and abdominal pain again today.  His abdominal series yesterday confirmed persistence of the obstructive appearance. I explained that due to his positive cocaine test on admission, that he would need a repeat test to be negative to proceed with surgery today.  He was upset that he had been tested, and refused to repeat testing today.  On my last time with him and his daughter present throughout this discussion, he was requesting discharge AMA.  Review of Systems:  Constitutional: denies fever, chills  HEENT: denies cough or congestion  Respiratory: denies any shortness of breath  Cardiovascular: denies chest pain or palpitations  Gastrointestinal: +distension, denies abdominal pain, N/V Genitourinary: denies burning with urination or urinary frequency Musculoskeletal: denies pain, decreased motor or sensation  Vital signs in last 24 hours: [min-max] current  Temp:  [98.1 F (36.7 C)-98.2 F (36.8 C)] 98.2 F (36.8 C) (11/18 0520) Pulse Rate:  [94-112] 97 (11/18 0520) Resp:  [16-20] 20 (11/18 0520) BP: (108-123)/(76-94) 116/83 (11/18 0520) SpO2:  [98 %-100 %] 100 % (11/18 0520)     Height: 5\' 10"  (177.8 cm) Weight: 83.9 kg BMI (Calculated): 26.54   Intake/Output last 2 shifts:  11/17 0701 - 11/18 0700 In: 840 [P.O.:840] Out: 0     Physical Exam:  Constitutional: alert, cooperative and no distress  HENT: normocephalic without obvious abnormality  Eyes: PERRL, EOM's grossly intact and symmetric  Respiratory: breathing non-labored at rest  Cardiovascular: regular rate and sinus rhythm  Gastrointestinal: Soft, non-tender, he remains distended and tympanic, no rebound/guarding  Musculoskeletal: no edema or wounds, motor and sensation grossly intact, NT    Labs:  CBC Latest Ref Rng & Units 10/31/2021 10/30/2021 10/29/2021  WBC 4.0 - 10.5 K/uL 6.2 5.9 14.2(H)  Hemoglobin 13.0 - 17.0 g/dL 7.0(L) 7.5(L) 9.8(L)  Hematocrit 39.0 - 52.0 % 24.5(L) 25.7(L) 33.6(L)  Platelets 150 - 400 K/uL 267 276 389   CMP Latest Ref Rng & Units 10/31/2021 10/30/2021 10/29/2021  Glucose 70 - 99 mg/dL 127(H) 176(H) 270(H)  BUN 6 - 20 mg/dL 9 13 15   Creatinine 0.61 - 1.24 mg/dL 1.12 1.04 1.52(H)  Sodium 135 - 145 mmol/L 136 133(L) 131(L)  Potassium 3.5 - 5.1 mmol/L 4.1 4.0 4.2  Chloride 98 - 111 mmol/L 105 100 95(L)  CO2 22 - 32 mmol/L 25 24 26   Calcium 8.9 - 10.3 mg/dL 8.0(L) 7.7(L) 9.5  Total Protein 6.5 - 8.1 g/dL 6.4(L) 6.5 8.6(H)  Total Bilirubin 0.3 - 1.2 mg/dL 0.5 0.5 0.5  Alkaline Phos 38 - 126 U/L 67 71 100  AST 15 - 41 U/L 11(L) 10(L) 16  ALT 0 - 44 U/L 7 6 8      Imaging studies:  CLINICAL DATA:  Small-bowel obstruction   EXAM: ABDOMEN - 2 VIEW   COMPARISON:  CT 10/29/2021   FINDINGS: Persistent diffusely dilated loops of small  bowel in the abdomen. There is no evidence of free intraperitoneal gas. No acute osseous abnormality.   IMPRESSION: Persistent small bowel obstruction.     Electronically Signed   By: Maurine Simmering M.D.   On: 10/30/2021 14:36   Assessment/Plan: (ICD-10's: K6.89) 50 y.o. male with what appears to be ascending colon mass, likely malignant, with resulting bowel obstruction, complicated by patient's mental status.               - Greatly appreciate medicine's willingness to admit and  assist with this patient   -Patient is refusing surgery today, refusing to be retested with drug screening.  -Alternative options once again reviewed, I do not believe GI will consider endoscopy due to the persistent obstructive pattern on x-ray.              - Monitor abdominal examination; on-going bowel function             - Check CEA; elevated at 11.1  - Oncology evaluation / CT Chest would be needed to complete staging work up              - Pain control prn; antiemetics prn             - Mobilization as tolerated  - Further management per primary service; we will follow    -- Ronny Bacon, M.D. [facility] 10/31/2021, 8:17 AM

## 2021-10-31 NOTE — Progress Notes (Signed)
SUBJECTIVE: No chest pain or shortness of breath   Vitals:   10/30/21 1603 10/30/21 1618 10/30/21 1937 10/31/21 0520  BP: 120/80 (!) 123/94 115/76 116/83  Pulse: 94 (!) 101 99 97  Resp: 16 16 16 20   Temp: 98.2 F (36.8 C)  98.1 F (36.7 C) 98.2 F (36.8 C)  TempSrc: Oral  Oral Oral  SpO2: 98% 100% 100% 100%  Weight:      Height:        Intake/Output Summary (Last 24 hours) at 10/31/2021 0800 Last data filed at 10/31/2021 0500 Gross per 24 hour  Intake 840 ml  Output 0 ml  Net 840 ml    LABS: Basic Metabolic Panel: Recent Labs    10/30/21 0624 10/31/21 0431  NA 133* 136  K 4.0 4.1  CL 100 105  CO2 24 25  GLUCOSE 176* 127*  BUN 13 9  CREATININE 1.04 1.12  CALCIUM 7.7* 8.0*  MG 2.0 2.0  PHOS 3.4  --    Liver Function Tests: Recent Labs    10/30/21 0624 10/31/21 0431  AST 10* 11*  ALT 6 7  ALKPHOS 71 67  BILITOT 0.5 0.5  PROT 6.5 6.4*  ALBUMIN 2.6* 2.8*   Recent Labs    10/29/21 0710  LIPASE 37   CBC: Recent Labs    10/30/21 0624 10/31/21 0431  WBC 5.9 6.2  HGB 7.5* 7.0*  HCT 25.7* 24.5*  MCV 71.8* 72.7*  PLT 276 267   Cardiac Enzymes: No results for input(s): CKTOTAL, CKMB, CKMBINDEX, TROPONINI in the last 72 hours. BNP: Invalid input(s): POCBNP D-Dimer: No results for input(s): DDIMER in the last 72 hours. Hemoglobin A1C: Recent Labs    10/29/21 0710  HGBA1C 8.5*   Fasting Lipid Panel: No results for input(s): CHOL, HDL, LDLCALC, TRIG, CHOLHDL, LDLDIRECT in the last 72 hours. Thyroid Function Tests: No results for input(s): TSH, T4TOTAL, T3FREE, THYROIDAB in the last 72 hours.  Invalid input(s): FREET3 Anemia Panel: Recent Labs    10/30/21 1159  VITAMINB12 331  FERRITIN 7*  TIBC 374  IRON 16*     PHYSICAL EXAM General: Well developed, well nourished, in no acute distress HEENT:  Normocephalic and atramatic Neck:  No JVD.  Lungs: Clear bilaterally to auscultation and percussion. Heart: HRRR . Normal S1 and S2  without gallops or murmurs.  Abdomen: Bowel sounds are positive, abdomen soft and non-tender  Msk:  Back normal, normal gait. Normal strength and tone for age. Extremities: No clubbing, cyanosis or edema.   Neuro: Alert and oriented X 3. 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  Clinically patient is in compensated heart failure and can proceed with surgery.  However patient is refusing surgery at this time.  Principal Problem:   Colonic obstruction (HCC) Active Problems:   Acute metabolic encephalopathy   HTN (hypertension)   Type 2 diabetes mellitus with complications (HCC)   Tobacco abuse   Polysubstance abuse ([place])   Iron deficiency anemia due to chronic blood loss    Sumeya Yontz A, MD, [facility] 10/31/2021 8:00 AM

## 2021-10-31 NOTE — Progress Notes (Signed)
Initial Nutrition Assessment  DOCUMENTATION CODES:   Non-severe (moderate) malnutrition in context of chronic illness  INTERVENTION:   Ensure Max protein supplement BID, each supplement provides 150kcal and 30g of protein.  MVI po daily   Pt at high refeed risk; recommend monitor potassium, magnesium and phosphorus labs daily until stable  NUTRITION DIAGNOSIS:   Moderate Malnutrition related to chronic illness (DM, CHF, substance abuse) as evidenced by mild fat depletion, mild muscle depletion, moderate muscle depletion.  GOAL:   Patient will meet greater than or equal to 90% of their needs  MONITOR:   PO intake, Supplement acceptance, Labs, Weight trends, Skin, I & O's  REASON FOR ASSESSMENT:   Consult Assessment of nutrition requirement/status  ASSESSMENT:   50 y.o. 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  Met with pt in room today. Pt reports good appetite and oral intake at baseline but reports that his oral intake was decreased pta r/t nausea and vomiting. Pt currently on GI soft diet. Pt's lunch tray was sitting on his side table and was ~85% eaten. Pt currently refusing surgery. RD discussed with pt the importance of adequate nutrition needed to support post op healing. Pt would like to have Ensure supplements while in hospital. RD will add supplements and MVI to help pt meet his estimated needs. Pt is likely at refeed risk. Per chart, pt appears weight stable pta.   Medications reviewed and include: colace, lovenox, lasix, insulin, aldactone, vitamin D, ferrous gluconate  Labs reviewed: K 4.1 wnl Wbc- 14.2(H), Hgb 9.8(L), Hct 33.6(L), MCV 72.1(L), MCH 21.0(L), MCHC 29.2(L) Iron 16(L), TIBC 374(H), ferritin 7(L)- 11/17 Vitamin 8.25(L)- 11/17 Cbgs- 298,  247, 120, 217 x 48 hrs  AIC 8.5(H)- 11/16  NUTRITION - FOCUSED PHYSICAL EXAM:  Flowsheet Row Most Recent Value  Orbital Region No depletion  Upper Arm Region Mild depletion  Thoracic and Lumbar Region Mild depletion  Buccal Region No depletion  Temple Region No depletion  Clavicle Bone Region Mild depletion  Clavicle and Acromion Bone Region Mild depletion  Scapular Bone Region No depletion  Dorsal Hand No depletion  Patellar Region Moderate depletion  Anterior Thigh Region Moderate depletion  Posterior Calf Region Moderate depletion  Edema (RD Assessment) None  Hair Reviewed  Eyes Reviewed  Mouth Reviewed  Skin Reviewed  Nails Reviewed   Diet Order:   Diet Order             DIET SOFT Room service appropriate? Yes; Fluid consistency: Thin  Diet effective now                  EDUCATION NEEDS:   Education needs have been addressed  Skin:  Skin Assessment: Reviewed RN Assessment  Last BM:  11/17  Height:   Ht Readings from Last 1 Encounters:  10/29/21 5' 10"  (1.778 m)    Weight:   Wt Readings from Last 1 Encounters:  10/29/21 83.9 kg    Ideal Body Weight:  75.45 kg  BMI:  Body mass index is 26.54 kg/m.  Estimated Nutritional Needs:   Kcal:  2100-2400kcal/day  Protein:  105-120g/day  Fluid:  2.3-2.6L/day  Koleen Distance MS, RD, LDN Please refer to [facility] for RD and/or RD on-call/weekend/after hours pager

## 2021-10-31 NOTE — Progress Notes (Addendum)
Hematology/Oncology Progress Note [facility] [phone] [phone]  Patient Care Team: Center, [facility] as PCP - General (General Practice)   Name of the patient: Joshua [name]  [id number]  1971/09/19  Date of visit: 10/31/21   INTERVAL HISTORY-  Mental status slightly improved, still lethargic.  He is able to stay awake for very short period of time.  Denies any nausea vomiting. UDS is positive for cocaine. Per note, patient refused surgery today, And refused to be retested with drug screening.   Review of systems- Review of Systems  Unable to perform ROS: Mental status change   Allergies  Allergen Reactions   Penicillins Other (See Comments)    Patient unsure of allergy    Patient Active Problem List   Diagnosis Date Noted   Iron deficiency anemia due to chronic blood loss    Colonic obstruction ([place]) 10/29/2021   Acute osteomyelitis of toe, right ([place]) 03/24/2021   Community acquired pneumonia 03/11/2021   CAD (coronary artery disease) 03/11/2021   Sepsis ([place]) 03/11/2021   Chest pain 03/11/2021   HTN (hypertension) 03/11/2021   Type 2 diabetes mellitus with complications ([place]) 46/96/2952   Tobacco abuse 03/11/2021   Polysubstance abuse ([place])    Osteomyelitis of great toe of right foot ([place]) 02/18/2021   Malnutrition of moderate degree 02/14/2021   Cocaine abuse ([place]) 02/13/2021   Homeless 02/13/2021   Cellulitis of great toe of right foot 02/12/2021   Diabetic foot ulcer ([place]) 02/12/2021   Lactic acidosis 84/13/2440   Acute metabolic encephalopathy 10/10/2535   Chronic combined systolic (congestive) and diastolic (congestive) heart failure ([place]) 02/12/2021   SIRS (systemic inflammatory response syndrome) ([place]) 10/04/2020   Acute respiratory failure with hypoxia ([place]) 07/15/2020   Multifocal pneumonia 07/15/2020   History of MI (myocardial infarction) 07/15/2020   Symptomatic anemia 07/15/2020    Acute ST elevation myocardial infarction (STEMI) involving left anterior descending (LAD) coronary artery ([place]) 09/09/2018   STEMI involving left anterior descending coronary artery ([place]) 09/09/2018   Hypoglycemia 06/02/2016     Past Medical History:  Diagnosis Date   Abrasion of toe with infection, right, subsequent encounter 03/27/2021   CHF (congestive heart failure) ([place])    Diabetes mellitus without complication ([place])    Hypertension      Past Surgical History:  Procedure Laterality Date   AMPUTATION TOE Right 04/01/2021   Procedure: AMPUTATION TOE;  Surgeon: Sharlotte Alamo, DPM;  Location: [facility] ORS;  Service: Podiatry;  Laterality: Right;  RIGHT GREAT TOE   CORONARY/GRAFT ACUTE MI REVASCULARIZATION N/A 09/09/2018   Procedure: Coronary/Graft Acute MI Revascularization;  Surgeon: Yolonda Kida, MD;  Location: [place] [facility];  Service: Cardiovascular;  Laterality: N/A;   LEFT HEART CATH AND CORONARY ANGIOGRAPHY N/A 09/09/2018   Procedure: LEFT HEART CATH AND CORONARY ANGIOGRAPHY;  Surgeon: Yolonda Kida, MD;  Location: [place] [facility];  Service: Cardiovascular;  Laterality: N/A;   LOWER EXTREMITY ANGIOGRAPHY Right 02/14/2021   Procedure: Lower Extremity Angiography;  Surgeon: Katha Cabal, MD;  Location: [place] [facility];  Service: Cardiovascular;  Laterality: Right;   none     STENT PLACEMENT VASCULAR ([facility] HX)      Social History   Socioeconomic History   Marital status: Single    Spouse name: Not on file   Number of children: Not on file   Years of education: Not on file   Highest education level: Not on file  Occupational History   Not  on file  Tobacco Use   Smoking status: Every Day   Smokeless tobacco: Never  Vaping Use   Vaping Use: Never used  Substance and Sexual Activity   Alcohol use: Yes    Alcohol/week: 0.0 standard drinks   Drug use: Not Currently    Comment: pt states no found unknown white substance in mouth   Sexual activity:  Not on file  Other Topics Concern   Not on file  Social History Narrative   Not on file   Social Determinants of Health   Financial Resource Strain: Not on file  Food Insecurity: Not on file  Transportation Needs: Not on file  Physical Activity: Not on file  Stress: Not on file  Social Connections: Not on file  Intimate Partner Violence: Not on file     Family History  Problem Relation Age of Onset   Cancer Mother    CAD Brother      Current Facility-Administered Medications:    acetaminophen (TYLENOL) tablet 650 mg, 650 mg, Oral, Q6H PRN **OR** acetaminophen (TYLENOL) suppository 650 mg, 650 mg, Rectal, Q6H PRN, Shawna Clamp, MD   carvedilol (COREG) tablet 3.125 mg, 3.125 mg, Oral, BID WC, Shawna Clamp, MD, 3.125 mg at 10/31/21 1013   docusate sodium (COLACE) capsule 100 mg, 100 mg, Oral, BID, Shawna Clamp, MD, 100 mg at 10/31/21 1013   enalapril (VASOTEC) tablet 5 mg, 5 mg, Oral, Daily, Neoma Laming A, MD, 5 mg at 10/31/21 1013   enoxaparin (LOVENOX) injection 40 mg, 40 mg, Subcutaneous, Q24H, Shawna Clamp, MD, 40 mg at 10/30/21 1707   ferric gluconate (FERRLECIT) 250 mg in sodium chloride 0.9 % 250 mL IVPB, 250 mg, Intravenous, Daily, Amin, Ankit Chirag, MD, Stopped at 10/31/21 1244   furosemide (LASIX) injection 20 mg, 20 mg, Intravenous, BID, Neoma Laming A, MD, 20 mg at 10/31/21 1014   guaiFENesin (ROBITUSSIN) 100 MG/5ML liquid 5 mL, 5 mL, Oral, Q4H PRN, Amin, Ankit Chirag, MD   hydrALAZINE (APRESOLINE) injection 10 mg, 10 mg, Intravenous, Q4H PRN, Amin, Ankit Chirag, MD   insulin aspart (novoLOG) injection 0-5 Units, 0-5 Units, Subcutaneous, QHS, Shawna Clamp, MD, 2 Units at 10/30/21 2151   insulin aspart (novoLOG) injection 0-9 Units, 0-9 Units, Subcutaneous, TID WC, Shawna Clamp, MD, 5 Units at 10/31/21 1309   ipratropium-albuterol (DUONEB) 0.5-2.5 (3) MG/3ML nebulizer solution 3 mL, 3 mL, Nebulization, Q4H PRN, Amin, Jeanella Flattery, MD   [START ON  11/01/2021] multivitamin with minerals tablet 1 tablet, 1 tablet, Oral, Daily, Amin, Ankit Chirag, MD   ondansetron (ZOFRAN) tablet 4 mg, 4 mg, Oral, Q6H PRN **OR** ondansetron (ZOFRAN) injection 4 mg, 4 mg, Intravenous, Q6H PRN, Shawna Clamp, MD   protein supplement (ENSURE MAX) liquid, 11 oz, Oral, BID, Amin, Ankit Chirag, MD   senna-docusate (Senokot-S) tablet 1 tablet, 1 tablet, Oral, QHS PRN, Amin, Ankit Chirag, MD   spironolactone (ALDACTONE) tablet 25 mg, 25 mg, Oral, Daily, Shawna Clamp, MD, 25 mg at 10/31/21 1013   traZODone (DESYREL) tablet 50 mg, 50 mg, Oral, QHS PRN, Amin, Jeanella Flattery, MD   Vitamin D (Ergocalciferol) (DRISDOL) capsule 50,000 Units, 50,000 Units, Oral, Q7 days, Damita Lack, MD, 50,000 Units at 10/31/21 1014   Physical exam:  Vitals:   10/30/21 1618 10/30/21 1937 10/31/21 0520 10/31/21 1517  BP: (!) 123/94 115/76 116/83 104/73  Pulse: (!) 101 99 97 (!) 105  Resp: 16 16 20 18   Temp:  98.1 F (36.7 C) 98.2 F (36.8 C) 99.2 F (37.3  C)  TempSrc:  Oral Oral   SpO2: 100% 100% 100% 100%  Weight:      Height:       Physical Exam HENT:     Head: Normocephalic and atraumatic.     Nose: Nose normal.     Mouth/Throat:     Pharynx: No oropharyngeal exudate.  Eyes:     General: No scleral icterus.    Pupils: Pupils are equal, round, and reactive to light.  Cardiovascular:     Rate and Rhythm: Normal rate.     Heart sounds: No murmur heard. Pulmonary:     Effort: Pulmonary effort is normal. No respiratory distress.  Abdominal:     General: There is distension.     Palpations: Abdomen is soft.  Musculoskeletal:        General: Normal range of motion.     Cervical back: Normal range of motion and neck supple.  Skin:    General: Skin is warm.  Neurological:     Cranial Nerves: No cranial nerve deficit.     Motor: No abnormal muscle tone.     Coordination: Coordination normal.     Comments: Mental status has improved, able to answer a few simple  questions.  Psychiatric:        Mood and Affect: Affect normal.       CMP Latest Ref Rng & Units 10/31/2021  Glucose 70 - 99 mg/dL 127(H)  BUN 6 - 20 mg/dL 9  Creatinine 0.61 - 1.24 mg/dL 1.12  Sodium 135 - 145 mmol/L 136  Potassium 3.5 - 5.1 mmol/L 4.1  Chloride 98 - 111 mmol/L 105  CO2 22 - 32 mmol/L 25  Calcium 8.9 - 10.3 mg/dL 8.0(L)  Total Protein 6.5 - 8.1 g/dL 6.4(L)  Total Bilirubin 0.3 - 1.2 mg/dL 0.5  Alkaline Phos 38 - 126 U/L 67  AST 15 - 41 U/L 11(L)  ALT 0 - 44 U/L 7   CBC Latest Ref Rng & Units 10/31/2021  WBC 4.0 - 10.5 K/uL 6.2  Hemoglobin 13.0 - 17.0 g/dL 7.0(L)  Hematocrit 39.0 - 52.0 % 24.5(L)  Platelets 150 - 400 K/uL 267    RADIOGRAPHIC STUDIES: I have personally reviewed the radiological images as listed and agreed with the findings in the report. CT HEAD WO CONTRAST (5MM)  Result Date: 10/29/2021 CLINICAL DATA:  Altered mental status.  Abdominal pain. EXAM: CT HEAD WITHOUT CONTRAST TECHNIQUE: Contiguous axial images were obtained from the base of the skull through the vertex without intravenous contrast. COMPARISON:  02/12/2021. FINDINGS: Brain: No evidence of acute infarction, hemorrhage, hydrocephalus, extra-axial collection or mass lesion/mass effect. Vascular: No hyperdense vessel or unexpected calcification. Skull: Normal. Negative for fracture or focal lesion. Sinuses/Orbits: Visualized globes and orbits are unremarkable. Mild right frontal sinus mucosal thickening with some dependent fluid. Mild anterior right ethmoid sinus mucosal thickening. Scattered minor bilateral ethmoid sinus mucosal thickening and sphenoid sinus mucosal thickening. Other: None. IMPRESSION: 1. No intracranial abnormalities. 2. Mild sinus mucosal thickening. Electronically Signed   By: Lajean Manes M.D.   On: 10/29/2021 15:52   CT ABDOMEN PELVIS W CONTRAST  Result Date: 10/29/2021 CLINICAL DATA:  Abdominal pain EXAM: CT ABDOMEN AND PELVIS WITH CONTRAST TECHNIQUE:  Multidetector CT imaging of the abdomen and pelvis was performed using the standard protocol following bolus administration of intravenous contrast. CONTRAST:  14mL OMNIPAQUE IOHEXOL 300 MG/ML  SOLN COMPARISON:  10/04/2020 FINDINGS: Lower chest: Unremarkable. Hepatobiliary: No suspicious focal abnormality within the liver parenchyma. There is  no evidence for gallstones, gallbladder wall thickening, or pericholecystic fluid. No intrahepatic or extrahepatic biliary dilation. Pancreas: No focal mass lesion. No dilatation of the main duct. No intraparenchymal cyst. No peripancreatic edema. Spleen: No splenomegaly. No focal mass lesion. Adrenals/Urinary Tract: No adrenal nodule or mass. Kidneys unremarkable. No evidence for hydroureter. The urinary bladder appears normal for the degree of distention. Stomach/Bowel: Stomach is unremarkable. No gastric wall thickening. No evidence of outlet obstruction. Duodenum is normally positioned as is the ligament of Treitz. Mild duodenal distension. Diffuse fluids no small bowel dilatation noted from the ligament of Treitz to the terminal ileum. Cecum is distended and fluid-filled. Circumferential "apple-core" lesion identified in the ascending colon about 5-6 cm distal to the ileocecal valve. Colon distal to this lesion is largely decompressed although there is some gas and stool in the sigmoid colon and rectum. Vascular/Lymphatic: There is moderate atherosclerotic calcification of the abdominal aorta without aneurysm. There is no gastrohepatic or hepatoduodenal ligament lymphadenopathy. No para-aortic lymphadenopathy. 14 mm ileocolic lymph node on image 52/2 appears to have central necrosis. Adjacent 2.1 cm short axis lymph node visible on image 51/2. Other upper normal to borderline enlarged lymph nodes are seen in the ileocolic mesentery. No pelvic sidewall lymphadenopathy. Reproductive: The prostate gland and seminal vesicles are unremarkable. Other: No intraperitoneal free  fluid. Musculoskeletal: No worrisome lytic or sclerotic osseous abnormality. IMPRESSION: 1. Obstructing circumferential apple-core lesion measuring approximately 6 cm in length is identified in the ascending colon, located about 5-6 cm distal to the ileocecal valve. 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 Lymphadenopathy is evident in the ileocolic mesentery, some of which shows central necrosis and is highly suspicious for metastatic disease. 2. Diffuse fluid-filled dilated small bowel from the ligament of Treitz to the terminal ileum measuring up to 3-4 cm diameter. Imaging features are compatible with obstruction. 3. Aortic Atherosclerosis (ICD10-I70.0). I discussed these findings by telephone with Dr. Kerman Passey at 0859 hours on 10/29/2021. Electronically Signed   By: Misty Stanley M.D.   On: 10/29/2021 09:02   DG Abd 2 Views  Result Date: 10/30/2021 CLINICAL DATA:  Small-bowel obstruction EXAM: ABDOMEN - 2 VIEW COMPARISON:  CT 10/29/2021 FINDINGS: Persistent diffusely dilated loops of small bowel in the abdomen. There is no evidence of free intraperitoneal gas. No acute osseous abnormality. IMPRESSION: Persistent small bowel obstruction. Electronically Signed   By: Maurine Simmering M.D.   On: 10/30/2021 14:36   ECHOCARDIOGRAM COMPLETE  Result Date: 10/30/2021    ECHOCARDIOGRAM REPORT   Patient Name:   Jakwan LAMONT Mccutcheon Date of Exam: 10/29/2021 Medical Rec #:  [id number]             Height:       70.0 in Accession #:    [id number]            Weight:       185.0 lb Date of Birth:  1971-07-14            BSA:          2.019 m Patient Age:    30 years              BP:           108/75 mmHg Patient Gender: M                     HR:           93 bpm. Exam Location:  [facility] Procedure: 2D Echo, Cardiac Doppler, Color  Doppler and Intracardiac            Opacification Agent Indications:     R48.54 Acute Systolic Heart Failure  History:         Patient has prior history of Echocardiogram  examinations, most                  recent 07/16/2020. CHF; Risk Factors:Hypertension and Diabetes.  Sonographer:     Cresenciano Lick RDCS Referring Phys:  [place] Diagnosing Phys: Ida Rogue MD IMPRESSIONS  1. Left ventricular ejection fraction, by estimation, is 25 to 30%. The left ventricle has severely decreased function. The left ventricle demonstrates moderate global hypokinesis with severe hypokinesis of teh anterior/anteroseptal and apical regions. The left ventricular internal cavity size was moderately dilated. Left ventricular diastolic parameters are consistent with Grade II diastolic dysfunction (pseudonormalization).  2. Right ventricular systolic function is normal. The right ventricular size is normal.  3. Left atrial size was moderately dilated.  4. The mitral valve is normal in structure. Mild to moderate mitral valve regurgitation. No evidence of mitral stenosis.  5. The aortic valve is normal in structure. Aortic valve regurgitation is not visualized. No aortic stenosis is present.  6. The inferior vena cava is normal in size with greater than 50% respiratory variability, suggesting right atrial pressure of 3 mmHg. FINDINGS  Left Ventricle: Left ventricular ejection fraction, by estimation, is 25 to 30%. The left ventricle has severely decreased function. The left ventricle demonstrates global hypokinesis. Definity contrast agent was given IV to delineate the left ventricular endocardial borders. The left ventricular internal cavity size was moderately dilated. There is no left ventricular hypertrophy. Left ventricular diastolic parameters are consistent with Grade II diastolic dysfunction (pseudonormalization). Right Ventricle: The right ventricular size is normal. No increase in right ventricular wall thickness. Right ventricular systolic function is normal. Left Atrium: Left atrial size was moderately dilated. Right Atrium: Right atrial size was normal in size.  Pericardium: There is no evidence of pericardial effusion. Mitral Valve: The mitral valve is normal in structure. Mild to moderate mitral valve regurgitation, with eccentric laterally directed jet. No evidence of mitral valve stenosis. Tricuspid Valve: The tricuspid valve is normal in structure. Tricuspid valve regurgitation is mild . No evidence of tricuspid stenosis. Aortic Valve: The aortic valve is normal in structure. Aortic valve regurgitation is not visualized. No aortic stenosis is present. Pulmonic Valve: The pulmonic valve was normal in structure. Pulmonic valve regurgitation is not visualized. No evidence of pulmonic stenosis. Aorta: The aortic root is normal in size and structure. Venous: The inferior vena cava is normal in size with greater than 50% respiratory variability, suggesting right atrial pressure of 3 mmHg. IAS/Shunts: No atrial level shunt detected by color flow Doppler.  LEFT VENTRICLE PLAX 2D LVIDd:         6.00 cm   Diastology LVIDs:         4.30 cm   LV e' medial:    6.64 cm/s LV PW:         0.90 cm   LV E/e' medial:  17.6 LV IVS:        0.90 cm   LV e' lateral:   6.31 cm/s LVOT diam:     2.00 cm   LV E/e' lateral: 18.5 LV SV:         33 LV SV Index:   17 LVOT Area:     3.14 cm  RIGHT VENTRICLE RV Basal diam:  4.20 cm  RV S prime:     12.20 cm/s TAPSE (M-mode): 2.1 cm LEFT ATRIUM             Index        RIGHT ATRIUM           Index LA diam:        4.90 cm 2.43 cm/m   RA Area:     14.90 cm LA Vol (A2C):   56.8 ml 28.13 ml/m  RA Volume:   39.70 ml  19.66 ml/m LA Vol (A4C):   49.0 ml 24.27 ml/m LA Biplane Vol: 56.2 ml 27.83 ml/m  AORTIC VALVE LVOT Vmax:   67.15 cm/s LVOT Vmean:  49.750 cm/s LVOT VTI:    0.107 m  AORTA Ao Root diam: 3.60 cm Ao Asc diam:  3.40 cm MITRAL VALVE MV Area (PHT): 3.42 cm     SHUNTS MV Decel Time: 222 msec     Systemic VTI:  0.11 m MV E velocity: 117.00 cm/s  Systemic Diam: 2.00 cm MV A velocity: 109.00 cm/s MV E/A ratio:  1.07 Ida Rogue MD  Electronically signed by Ida Rogue MD Signature Date/Time: 10/30/2021/8:01:03 AM    Final     Assessment and plan-   #Small bowel obstruction due to ascending colon mass.  Base level C 11 Refused surgery today. Continue pain control, antiemetics as needed.  Serious X-ray for close monitoring. Obtain CT chest to complete staging. If obstruction persists, surgery if he agrees.   #Microcytic anemia, iron panel is consistent with iron deficiency.  IV Venofer treatments.Monitor CBC.  #Polysubstance abuse #CHF, --EF 25-30% #Overall prognosis is poor with his multiple medical problems. Consult palliative care service to clarify code status and goals of care.  Thank you for allowing me to participate in the care of this patient.   Earlie Server, MD, PhD 10/31/2021

## 2021-10-31 NOTE — TOC Initial Note (Signed)
Transition of Care [facility]) - Initial/Assessment Note    Patient Details  Name: Joshua [name] MRN: [id number] Date of Birth: 02-Mar-1971  Transition of Care [facility]) CM/SW Contact:    Candie Chroman, LCSW Phone Number: 10/31/2021, 1:02 PM  Clinical Narrative:  Readmission prevention screen complete. CSW met with patient. No supports at bedside. CSW introduced role and explained that discharge planning would be discussed. Patient currently living with a friend but is unsure how long he will be able to stay there. Discussed potential shelter if needed but he said he has been turned down by them several times for not having an ID. Patient asked about housing and disability. Patient reports applying for disability and this will be the 3rd time. PCP is at the [facility]. Last name is Sharlet Salina but unable to locate her on their website. Patient gets rides to his appointments from whoever is able. Patient uses the [facility] but said when he last tried to fill his medications, he was told his Medicaid would not cover them. Tried calling pharmacy to look into this but they are closed until Monday. Patient is aware. No home health or DME use prior to admission.  No further concerns. CSW encouraged patient to contact CSW as needed. CSW will continue to follow patient for support and facilitate return home when stable.               Expected Discharge Plan: Home/Self Care Barriers to Discharge: Continued Medical Work up   Patient Goals and CMS Choice        Expected Discharge Plan and Services Expected Discharge Plan: Home/Self Care     Post Acute Care Choice: NA Living arrangements for the past 2 months: Single Family Home                                      Prior Living Arrangements/Services Living arrangements for the past 2 months: Single Family Home Lives with:: Friends Patient language and need for interpreter reviewed:: Yes Do you feel safe going back  to the place where you live?: Yes      Need for Family Participation in Patient Care: Yes (Comment) Care giver support system in place?: Yes (comment)   Criminal Activity/Legal Involvement Pertinent to Current Situation/Hospitalization: No - Comment as needed  Activities of Daily Living Home Assistive Devices/Equipment: None ADL Screening (condition at time of admission) Patient's cognitive ability adequate to safely complete daily activities?: Yes Is the patient deaf or have difficulty hearing?: No Does the patient have difficulty seeing, even when wearing glasses/contacts?: No Does the patient have difficulty concentrating, remembering, or making decisions?: No Patient able to express need for assistance with ADLs?: Yes Does the patient have difficulty dressing or bathing?: No Independently performs ADLs?: Yes (appropriate for developmental age) Does the patient have difficulty walking or climbing stairs?: No Weakness of Legs: None Weakness of Arms/Hands: None  Permission Sought/Granted Permission sought to share information with : Facility Art therapist granted to share information with : Yes, Verbal Permission Granted     Permission granted to share info w AGENCY: [place]        Emotional Assessment Appearance:: Appears stated age Attitude/Demeanor/Rapport: Engaged, Gracious Affect (typically observed): Accepting, Appropriate, Calm, Pleasant Orientation: : Oriented to Self, Oriented to Place, Oriented to  Time, Oriented to Situation Alcohol / Substance Use: Not Applicable Psych Involvement: No (comment)  Admission diagnosis:  Colonic obstruction ([place]) [K56.609] SBO (small bowel obstruction) ([place]) [K56.609] Colonic mass [K63.89] Patient Active Problem List   Diagnosis Date Noted   Iron deficiency anemia due to chronic blood loss    Colonic obstruction ([place]) 10/29/2021   Acute osteomyelitis of toe, right ([place]) 03/24/2021   Community  acquired pneumonia 03/11/2021   CAD (coronary artery disease) 03/11/2021   Sepsis ([place]) 03/11/2021   Chest pain 03/11/2021   HTN (hypertension) 03/11/2021   Type 2 diabetes mellitus with complications ([place]) 17/51/0258   Tobacco abuse 03/11/2021   Polysubstance abuse ([place])    Osteomyelitis of great toe of right foot ([place]) 02/18/2021   Malnutrition of moderate degree 02/14/2021   Cocaine abuse ([place]) 02/13/2021   Homeless 02/13/2021   Cellulitis of great toe of right foot 02/12/2021   Diabetic foot ulcer ([place]) 02/12/2021   Lactic acidosis 52/77/8242   Acute metabolic encephalopathy 35/36/1443   Chronic combined systolic (congestive) and diastolic (congestive) heart failure ([place]) 02/12/2021   SIRS (systemic inflammatory response syndrome) ([place]) 10/04/2020   Acute respiratory failure with hypoxia ([place]) 07/15/2020   Multifocal pneumonia 07/15/2020   History of MI (myocardial infarction) 07/15/2020   Symptomatic anemia 07/15/2020   Acute ST elevation myocardial infarction (STEMI) involving left anterior descending (LAD) coronary artery ([place]) 09/09/2018   STEMI involving left anterior descending coronary artery ([place]) 09/09/2018   Hypoglycemia 06/02/2016   PCP:  [facility], [place]:   [place] [address]), [place]) [place] [address] Phone: [phone] Fax: [phone]  [facility] Donna Dust[place], [place] [address] [place] [address] Phone: [phone] Fax: [phone]     Social Determinants of Health (SDOH) Interventions    Readmission Risk Interventions Readmission Risk Prevention Plan 10/31/2021  Transportation Screening Complete  Medication Review (RN Care Manager) Complete  PCP or Specialist appointment within 3-5 days of discharge Complete  SW Recovery Care/Counseling Consult Complete  [place] Not Applicable  Some recent data might be hidden

## 2021-10-31 NOTE — Plan of Care (Signed)
Continuing with plan of care. 

## 2021-10-31 NOTE — Progress Notes (Signed)
Patient refused to have CBG checked and refused to have vital signs taken.

## 2021-10-31 NOTE — TOC CM/SW Note (Signed)
CSW acknowledges SNF consult. Please consult PT and OT if it is believed that patient needs rehab.  Dayton Scrape, [place]

## 2021-10-31 NOTE — OR Nursing (Signed)
Dr. Christian Mate aware that pt is refusing to sign consent or even discuss the surgery with Stacy,RN. Acknowledged no new orders.

## 2021-11-01 LAB — COMPREHENSIVE METABOLIC PANEL
ALT: 7 U/L (ref 0–44)
AST: 13 U/L — ABNORMAL LOW (ref 15–41)
Albumin: 2.9 g/dL — ABNORMAL LOW (ref 3.5–5.0)
Alkaline Phosphatase: 69 U/L (ref 38–126)
Anion gap: 7 (ref 5–15)
BUN: 9 mg/dL (ref 6–20)
CO2: 23 mmol/L (ref 22–32)
Calcium: 8.2 mg/dL — ABNORMAL LOW (ref 8.9–10.3)
Chloride: 103 mmol/L (ref 98–111)
Creatinine, Ser: 0.95 mg/dL (ref 0.61–1.24)
GFR, Estimated: >60 mL/min (ref >60)
Glucose, Bld: 167 mg/dL — ABNORMAL HIGH (ref 70–99)
Potassium: 3.9 mmol/L (ref 3.5–5.1)
Sodium: 133 mmol/L — ABNORMAL LOW (ref 135–145)
Total Bilirubin: 0.3 mg/dL (ref 0.3–1.2)
Total Protein: 6.6 g/dL (ref 6.5–8.1)

## 2021-11-01 LAB — CBC
HCT: 25.1 % — ABNORMAL LOW (ref 39.0–52.0)
Hemoglobin: 7 g/dL — ABNORMAL LOW (ref 13.0–17.0)
MCH: 20.3 pg — ABNORMAL LOW (ref 26.0–34.0)
MCHC: 27.9 g/dL — ABNORMAL LOW (ref 30.0–36.0)
MCV: 72.8 fL — ABNORMAL LOW (ref 80.0–100.0)
Platelets: 258 10*3/uL (ref 150–400)
RBC: 3.45 MIL/uL — ABNORMAL LOW (ref 4.22–5.81)
RDW: 16.7 % — ABNORMAL HIGH (ref 11.5–15.5)
WBC: 7.9 10*3/uL (ref 4.0–10.5)
nRBC: 0 % (ref 0.0–0.2)

## 2021-11-01 LAB — GLUCOSE, CAPILLARY
Glucose-Capillary: 100 mg/dL — ABNORMAL HIGH (ref 70–99)
Glucose-Capillary: 182 mg/dL — ABNORMAL HIGH (ref 70–99)
Glucose-Capillary: 159 mg/dL — ABNORMAL HIGH (ref 70–99)

## 2021-11-01 LAB — MAGNESIUM: Magnesium: 1.9 mg/dL (ref 1.7–2.4)

## 2021-11-01 NOTE — Plan of Care (Signed)
Continuing with plan of care. 

## 2021-11-01 NOTE — Progress Notes (Signed)
PROGRESS NOTE  Joshua [name] GYI:[id number] DOB: 1971/11/06 DOA: 10/29/2021 PCP: [facility], [place]  HPI/Recap of past 14 hours: 50 year old male with history of chronic congestive heart failure, type 2 diabetes mellitus, hypertension, substance abuse, tobacco abuse, who came into the emergency room to be evaluated for 2-week history of ongoing abdominal pain work-up in the ED, consisting of CT scan showed obstructing circumferential apple core lesion in the ascending colon consistent with colorectal cancer with lymphadenopathy, central necrosis and likely metastasis General surgery was consulted and recommended patient will need to undergo will benefit from right-sided hemicolectomy to relieve the obstruction.  Patient refused and psychiatry was also consulted next  Subjective: Patient seen and examined at bedside complaining that he still has abdominal pain  Assessment/Plan: Principal Problem:   Colonic obstruction (HCC) Active Problems:   Acute metabolic encephalopathy   HTN (hypertension)   Type 2 diabetes mellitus with complications ([place])   Tobacco abuse   Polysubstance abuse ([place])   Iron deficiency anemia due to chronic blood loss   Colonic mass  #1 complete obstruction of small bowel secondary to primary colorectal malignancy. General surgery plans to do right-sided hemicolectomy. He still refusing to sign consent Also refusing to repeat drug screen Refusing NG tube.  2.  Anemia of chronic disease with microcytic iron deficiency anemia Patient is currently receiving iron transfusion infusion his current hemoglobin is 7.0  3.  Hyponatremia Acute on chronic kidney injury Improved to 133 Continue IV hydration  4. 2 diabetes mellitus uncontrolled hemoglobin A1c is 8.5 Continue sliding scale Code Status: Full  Severity of Illness: The appropriate patient status for this patient is INPATIENT. Inpatient status is judged to be reasonable and necessary  in order to provide the required intensity of service to ensure the patient's safety. The patient's presenting symptoms, physical exam findings, and initial radiographic and laboratory data in the context of their chronic comorbidities is felt to place them at high risk for further clinical deterioration. Furthermore, it is not anticipated that the patient will be medically stable for discharge from the hospital within 2 midnights of admission.  Complete bowel obstruction  * I certify that at the point of admission it is my clinical judgment that the patient will require inpatient hospital care spanning beyond 2 midnights from the point of admission due to high intensity of service, high risk for further deterioration and high frequency of surveillance required.*   Family Communication: None at bedside  Disposition Plan: Home Status is: Inpatient   Dispo: The patient is from: Home              Anticipated d/c is to:               Anticipated d/c date is:               Patient currently not medically stable for discharge  Consultants: Oncology General surgery   Procedures None  Antimicrobials: None  DVT prophylaxis: Lovenox   Objective: Vitals:   10/31/21 0520 10/31/21 1517 10/31/21 1926 11/01/21 0507  BP: 116/83 104/73 98/62 112/85  Pulse: 97 (!) 105 99 87  Resp: 20 18 20 20   Temp: 98.2 F (36.8 C) 99.2 F (37.3 C) 99.4 F (37.4 C) 99.1 F (37.3 C)  TempSrc: Oral  Oral Oral  SpO2: 100% 100% 99% 100%  Weight:      Height:        Intake/Output Summary (Last 24 hours) at 11/01/2021 0858 Last data filed at 11/01/2021  0100 Gross per 24 hour  Intake 750 ml  Output --  Net 750 ml   Filed Weights   10/29/21 0710  Weight: 83.9 kg   Body mass index is 26.54 kg/m.  Exam:  General: 50 y.o. year-old male well developed well nourished in no acute distress.  Alert and oriented x3. Cardiovascular: Regular rate and rhythm with no rubs or gallops.  No thyromegaly or JVD  noted.   Respiratory: Clear to auscultation with no wheezes or rales. Good inspiratory effort. Abdomen: Tender nondistended with normal            Musculoskeletal: No lower extremity edema. 2/4 pulses in all 4 extremities. Skin: No ulcerative lesions noted or rashes, Psychiatry: Mood is appropriate for condition and setting Neurology:    Data Reviewed: CBC: Recent Labs  Lab 10/29/21 0710 10/30/21 0624 10/30/21 1159 10/31/21 0431 11/01/21 0633  WBC 14.2* 5.9  --  6.2 7.9  HGB 9.8* 7.5*  --  7.0* 7.0*  HCT 33.6* 25.7* 30.4* 24.5* 25.1*  MCV 72.1* 71.8*  --  72.7* 72.8*  PLT 389 276  --  267 009   Basic Metabolic Panel: Recent Labs  Lab 10/29/21 0710 10/30/21 0624 10/31/21 0431 11/01/21 0633  NA 131* 133* 136 133*  K 4.2 4.0 4.1 3.9  CL 95* 100 105 103  CO2 26 24 25 23   GLUCOSE 270* 176* 127* 167*  BUN 15 13 9 9   CREATININE 1.52* 1.04 1.12 0.95  CALCIUM 9.5 7.7* 8.0* 8.2*  MG  --  2.0 2.0 1.9  PHOS  --  3.4  --   --    GFR: Estimated Creatinine Clearance: 96.1 mL/min (by C-G formula based on SCr of 0.95 mg/dL). Liver Function Tests: Recent Labs  Lab 10/29/21 0710 10/30/21 0624 10/31/21 0431 11/01/21 0633  AST 16 10* 11* 13*  ALT 8 6 7 7   ALKPHOS 100 71 67 69  BILITOT 0.5 0.5 0.5 0.3  PROT 8.6* 6.5 6.4* 6.6  ALBUMIN 3.6 2.6* 2.8* 2.9*   Recent Labs  Lab 10/29/21 0710  LIPASE 37   Recent Labs  Lab 10/29/21 0955  AMMONIA 28   Coagulation Profile: No results for input(s): INR, PROTIME in the last 168 hours. Cardiac Enzymes: No results for input(s): CKTOTAL, CKMB, CKMBINDEX, TROPONINI in the last 168 hours. BNP (last 3 results) No results for input(s): PROBNP in the last 8760 hours. HbA1C: No results for input(s): HGBA1C in the last 72 hours. CBG: Recent Labs  Lab 10/30/21 1707 10/30/21 2126 10/31/21 1154 10/31/21 1706 10/31/21 2051  GLUCAP 120* 247* 298* 106* 158*   Lipid Profile: No results for input(s): CHOL, HDL, LDLCALC, TRIG,  CHOLHDL, LDLDIRECT in the last 72 hours. Thyroid Function Tests: No results for input(s): TSH, T4TOTAL, FREET4, T3FREE, THYROIDAB in the last 72 hours. Anemia Panel: Recent Labs    10/30/21 1159  VITAMINB12 331  FERRITIN 7*  TIBC 374  IRON 16*   Urine analysis:    Component Value Date/Time   COLORURINE YELLOW (A) 10/29/2021 1332   APPEARANCEUR CLEAR (A) 10/29/2021 1332   LABSPEC 1.029 10/29/2021 1332   PHURINE 5.0 10/29/2021 1332   GLUCOSEU NEGATIVE 10/29/2021 1332   HGBUR NEGATIVE 10/29/2021 1332   BILIRUBINUR NEGATIVE 10/29/2021 1332   KETONESUR 5 (A) 10/29/2021 1332   PROTEINUR 30 (A) 10/29/2021 1332   NITRITE NEGATIVE 10/29/2021 1332   LEUKOCYTESUR NEGATIVE 10/29/2021 1332   Sepsis Labs: @LABRCNTIP (procalcitonin:4,lacticidven:4)  ) Recent Results (from the past 240 hour(s))  Resp  Panel by RT-PCR (Flu A&B, Covid) Nasopharyngeal Swab     Status: None   Collection Time: 10/29/21  7:25 AM   Specimen: Nasopharyngeal Swab; Nasopharyngeal(NP) swabs in vial transport medium  Result Value Ref Range Status   SARS Coronavirus 2 by RT PCR NEGATIVE NEGATIVE Final    Comment: (NOTE) SARS-CoV-2 target nucleic acids are NOT DETECTED.  The SARS-CoV-2 RNA is generally detectable in upper respiratory specimens during the acute phase of infection. The lowest concentration of SARS-CoV-2 viral copies this assay can detect is 138 copies/mL. A negative result does not preclude SARS-Cov-2 infection and should not be used as the sole basis for treatment or other patient management decisions. A negative result may occur with  improper specimen collection/handling, submission of specimen other than nasopharyngeal swab, presence of viral mutation(s) within the areas targeted by this assay, and inadequate number of viral copies(<138 copies/mL). A negative result must be combined with clinical observations, patient history, and epidemiological information. The expected result is  Negative.  Fact Sheet for Patients:  EntrepreneurPulse.com.au  Fact Sheet for Healthcare Providers:  IncredibleEmployment.be  This test is no t yet approved or cleared by the [place] FDA and  has been authorized for detection and/or diagnosis of SARS-CoV-2 by FDA under an Emergency Use Authorization (EUA). This EUA will remain  in effect (meaning this test can be used) for the duration of the COVID-19 declaration under Section 564(b)(1) of the Act, 21 U.S.C.section 360bbb-3(b)(1), unless the authorization is terminated  or revoked sooner.       Influenza A by PCR NEGATIVE NEGATIVE Final   Influenza B by PCR NEGATIVE NEGATIVE Final    Comment: (NOTE) The Xpert Xpress SARS-CoV-2/FLU/RSV plus assay is intended as an aid in the diagnosis of influenza from Nasopharyngeal swab specimens and should not be used as a sole basis for treatment. Nasal washings and aspirates are unacceptable for Xpert Xpress SARS-CoV-2/FLU/RSV testing.  Fact Sheet for Patients: EntrepreneurPulse.com.au  Fact Sheet for Healthcare Providers: IncredibleEmployment.be  This test is not yet approved or cleared by the [place] FDA and has been authorized for detection and/or diagnosis of SARS-CoV-2 by FDA under an Emergency Use Authorization (EUA). This EUA will remain in effect (meaning this test can be used) for the duration of the COVID-19 declaration under Section 564(b)(1) of the Act, 21 U.S.C. section 360bbb-3(b)(1), unless the authorization is terminated or revoked.  Performed at [facility], [address], [place], [place] [address]       Studies: CT CHEST W CONTRAST  Result Date: 10/31/2021 CLINICAL DATA:  Gastrointestinal cancer.  Staging. EXAM: CT CHEST WITH CONTRAST TECHNIQUE: Multidetector CT imaging of the chest was performed during intravenous contrast administration. CONTRAST:  57mL OMNIPAQUE  IOHEXOL 350 MG/ML SOLN COMPARISON:  None. FINDINGS: Cardiovascular: The thoracic aorta is normal in caliber with no dissection or significant atherosclerotic change. Previous CABG. The heart is unremarkable. Central pulmonary arteries are normal. Mediastinum/Nodes: No enlarged mediastinal, hilar, or axillary lymph nodes. Thyroid gland, trachea, and esophagus demonstrate no significant findings. Lungs/Pleura: Lungs are clear. No pleural effusion or pneumothorax. Upper Abdomen: The abdomen was better assessed on yesterday's CT scan. The patient has known small bowel obstruction was better visualized yesterday. CS heard a CT scan of the abdomen and pelvis. Musculoskeletal: No chest wall abnormality. No acute or significant osseous findings. IMPRESSION: 1. No metastatic disease in the chest. 2. Previous CABG. 3. Persistent small-bowel obstruction per the scout view. This was better assessed with the recent KUB and CT  scan. Electronically Signed   By: Dorise Bullion III M.D.   On: 10/31/2021 22:23    Scheduled Meds:  carvedilol  3.125 mg Oral BID WC   docusate sodium  100 mg Oral BID   enalapril  5 mg Oral Daily   enoxaparin (LOVENOX) injection  40 mg Subcutaneous Q24H   furosemide  20 mg Intravenous BID   insulin aspart  0-5 Units Subcutaneous QHS   insulin aspart  0-9 Units Subcutaneous TID WC   multivitamin with minerals  1 tablet Oral Daily   Ensure Max Protein  11 oz Oral BID   spironolactone  25 mg Oral Daily   Vitamin D (Ergocalciferol)  50,000 Units Oral Q7 days    Continuous Infusions:  ferric gluconate (FERRLECIT) IVPB Stopped (10/31/21 1244)     LOS: 3 days     Cristal Deer, MD [facility]  To reach me or the doctor on call, go to: [email or web address] Password [email or web address]  11/01/2021, 8:58 AM

## 2021-11-02 DIAGNOSIS — D500 Iron deficiency anemia secondary to blood loss (chronic): Secondary | ICD-10-CM

## 2021-11-02 DIAGNOSIS — Z794 Long term (current) use of insulin: Secondary | ICD-10-CM

## 2021-11-02 DIAGNOSIS — F1910 Other psychoactive substance abuse, uncomplicated: Secondary | ICD-10-CM

## 2021-11-02 DIAGNOSIS — E1165 Type 2 diabetes mellitus with hyperglycemia: Secondary | ICD-10-CM

## 2021-11-02 DIAGNOSIS — I5022 Chronic systolic (congestive) heart failure: Secondary | ICD-10-CM

## 2021-11-02 LAB — COMPREHENSIVE METABOLIC PANEL
ALT: 7 U/L (ref 0–44)
AST: 13 U/L — ABNORMAL LOW (ref 15–41)
Albumin: 2.6 g/dL — ABNORMAL LOW (ref 3.5–5.0)
Alkaline Phosphatase: 62 U/L (ref 38–126)
Anion gap: 6 (ref 5–15)
BUN: 8 mg/dL (ref 6–20)
CO2: 26 mmol/L (ref 22–32)
Calcium: 8.3 mg/dL — ABNORMAL LOW (ref 8.9–10.3)
Chloride: 103 mmol/L (ref 98–111)
Creatinine, Ser: 0.91 mg/dL (ref 0.61–1.24)
GFR, Estimated: >60 mL/min (ref >60)
Glucose, Bld: 157 mg/dL — ABNORMAL HIGH (ref 70–99)
Potassium: 3.6 mmol/L (ref 3.5–5.1)
Sodium: 135 mmol/L (ref 135–145)
Total Bilirubin: 0.1 mg/dL — ABNORMAL LOW (ref 0.3–1.2)
Total Protein: 6.3 g/dL — ABNORMAL LOW (ref 6.5–8.1)

## 2021-11-02 LAB — CBC
HCT: 23.2 % — ABNORMAL LOW (ref 39.0–52.0)
Hemoglobin: 6.7 g/dL — ABNORMAL LOW (ref 13.0–17.0)
MCH: 21 pg — ABNORMAL LOW (ref 26.0–34.0)
MCHC: 28.9 g/dL — ABNORMAL LOW (ref 30.0–36.0)
MCV: 72.7 fL — ABNORMAL LOW (ref 80.0–100.0)
Platelets: 270 10*3/uL (ref 150–400)
RBC: 3.19 MIL/uL — ABNORMAL LOW (ref 4.22–5.81)
RDW: 16.8 % — ABNORMAL HIGH (ref 11.5–15.5)
WBC: 7.2 10*3/uL (ref 4.0–10.5)
nRBC: 0 % (ref 0.0–0.2)

## 2021-11-02 LAB — PREPARE RBC (CROSSMATCH)

## 2021-11-02 LAB — GLUCOSE, CAPILLARY
Glucose-Capillary: 211 mg/dL — ABNORMAL HIGH (ref 70–99)
Glucose-Capillary: 136 mg/dL — ABNORMAL HIGH (ref 70–99)
Glucose-Capillary: 159 mg/dL — ABNORMAL HIGH (ref 70–99)
Glucose-Capillary: 224 mg/dL — ABNORMAL HIGH (ref 70–99)

## 2021-11-02 LAB — URINE DRUG SCREEN, QUALITATIVE (ARMC ONLY)
Amphetamines, Ur Screen: NOT DETECTED
Barbiturates, Ur Screen: NOT DETECTED
Benzodiazepine, Ur Scrn: NOT DETECTED
Cannabinoid 50 Ng, Ur ~~LOC~~: NOT DETECTED
Cocaine Metabolite,Ur ~~LOC~~: NOT DETECTED
MDMA (Ecstasy)Ur Screen: NOT DETECTED
Methadone Scn, Ur: NOT DETECTED
Opiate, Ur Screen: NOT DETECTED
Phencyclidine (PCP) Ur S: NOT DETECTED
Tricyclic, Ur Screen: NOT DETECTED

## 2021-11-02 LAB — MAGNESIUM: Magnesium: 1.8 mg/dL (ref 1.7–2.4)

## 2021-11-02 LAB — ABO/RH: ABO/RH(D): A POS

## 2021-11-02 MED ORDER — ACETAMINOPHEN 325 MG PO TABS
650.0000 mg | ORAL_TABLET | Freq: Once | ORAL | Status: AC
  Administered 2021-11-02: 650 mg via ORAL
  Filled 2021-11-02: qty 2

## 2021-11-02 MED ORDER — SODIUM CHLORIDE 0.9% IV SOLUTION: Freq: Once | INTRAVENOUS | Status: AC

## 2021-11-02 NOTE — Progress Notes (Addendum)
Subjective:  CC: Joshua [name] is a 50 y.o. male  Hospital stay day 4,   HPI: Denies any acute complaints.  Still tolerating regular diet  ROS:  General: Denies weight loss, weight gain, fatigue, fevers, chills, and night sweats. Heart: Denies chest pain, palpitations, racing heart, irregular heartbeat, leg pain or swelling, and decreased activity tolerance. Respiratory: Denies breathing difficulty, shortness of breath, wheezing, cough, and sputum. GI: Denies change in appetite, heartburn, nausea, vomiting, constipation, diarrhea, and blood in stool. GU: Denies difficulty urinating, pain with urinating, urgency, frequency, blood in urine.   Objective:   Temp:  [98 F (36.7 C)-98.6 F (37 C)] 98.6 F (37 C) (11/20 0746) Pulse Rate:  [88-100] 88 (11/20 0746) Resp:  [16-20] 18 (11/20 0746) BP: (93-116)/(63-76) 106/71 (11/20 0746) SpO2:  [97 %-100 %] 100 % (11/20 0746)     Height: 5\' 10"  (177.8 cm) Weight: 83.9 kg BMI (Calculated): 26.54   Intake/Output this shift:   Intake/Output Summary (Last 24 hours) at 11/02/2021 5277 Last data filed at 11/02/2021 0145 Gross per 24 hour  Intake 990 ml  Output 400 ml  Net 590 ml    Constitutional :  alert, cooperative, appears stated age, and no distress  Respiratory:  clear to auscultation bilaterally  Cardiovascular:  regular rate and rhythm  Gastrointestinal: Soft, no guarding, but obvious distention noticed .   Skin: Cool and moist  Psychiatric: Normal affect, non-agitated, not confused       LABS:  CMP Latest Ref Rng & Units 11/02/2021 11/01/2021 10/31/2021  Glucose 70 - 99 mg/dL 157(H) 167(H) 127(H)  BUN 6 - 20 mg/dL 8 9 9   Creatinine 0.61 - 1.24 mg/dL 0.91 0.95 1.12  Sodium 135 - 145 mmol/L 135 133(L) 136  Potassium 3.5 - 5.1 mmol/L 3.6 3.9 4.1  Chloride 98 - 111 mmol/L 103 103 105  CO2 22 - 32 mmol/L 26 23 25   Calcium 8.9 - 10.3 mg/dL 8.3(L) 8.2(L) 8.0(L)  Total Protein 6.5 - 8.1 g/dL 6.3(L) 6.6 6.4(L)  Total  Bilirubin 0.3 - 1.2 mg/dL 0.1(L) 0.3 0.5  Alkaline Phos 38 - 126 U/L 62 69 67  AST 15 - 41 U/L 13(L) 13(L) 11(L)  ALT 0 - 44 U/L 7 7 7    CBC Latest Ref Rng & Units 11/02/2021 11/01/2021 10/31/2021  WBC 4.0 - 10.5 K/uL 7.2 7.9 6.2  Hemoglobin 13.0 - 17.0 g/dL 6.7(L) 7.0(L) 7.0(L)  Hematocrit 39.0 - 52.0 % 23.2(L) 25.1(L) 24.5(L)  Platelets 150 - 400 K/uL 270 258 267    RADS: N/a Assessment:   Obstructing colon mass.  Abdominal exam consistent with persistent distention as previously reported but still tolerating a diet with no report of nausea or vomiting.  Patient became very agitated during conversation today.  I started our conversation by asking if he reconsidered proceeding with the surgery or not.  He states he was always under the impression surgery will proceed tomorrow, Monday 11/21.  He reports that he has never refused surgery during his entire hospital stay, and the only reason surgery did not happen on Friday was because of a positive urine drug screen test a few days prior.  I tried to explain to him that as the covering surgeon for the weekend, the report I received from the team was patient continued to refuse any surgical intervention, and this was confirmed in the chart with multiple documentations from multiple team members stating that patient is still refusing surgery.  Patient became very irate stating that he  never said such a thing and every physician he has talked to so far stated that he will be proceeding with surgery tomorrow.  I explained to him that I can only report and give recommendations based on what I have been told so far, and I will be more than happy to arrange proceeding with the surgery.  After confirming in the surgery schedule he is not scheduled to proceed with any surgery, I asked him to sign a consent form, to which he was initially hesitant again.  He eventually was agreeable after I explained to him the purpose of the consent form, but made it very  clear to him that by signing this consent form, it does NOT guarantee that he will proceed with the surgery tomorrow.  I explained to him the consent form is only a written documentation stating he is agreeable to proceed with the surgery but in no way guarantees the exact timing of the surgery itself.  Patient verbalized understanding and is agreeable to signing the consent form.  Due to the distention noted on exam today, my recommendation was to keep him n.p.o. during the day to minimize any issues with food retention in the stomach, if he is somehow able to be added onto the schedule tomorrow.  The patient became irate at this recommendation again, stating he knows his body and he should be fine being n.p.o. starting at midnight.  He stated that this was also the original plan to begin with as well.  I told him my official recommendation still stands at being n.p.o. starting this morning, but it is ultimately his choice to decide.  Covering RN for today was present in the room during the majority of the conversation above, and she verbalized understanding as well.  I ended our conversation by stating that he still has the choice to change his mind at any point, and emphasized that he will need to discuss the reason for the miscommunication with the primary team when they return tomorrow.

## 2021-11-02 NOTE — Progress Notes (Signed)
Consent for procedure signed and placed on chart

## 2021-11-02 NOTE — Progress Notes (Signed)
Patient ID: Joshua [name], male   DOB: [birth year], 50 y.o.   MRN: [id number] [facility] PROGRESS NOTE  Joshua [name] TKP:[id number] DOB: Aug 16, 1971 DOA: 10/29/2021 PCP: [facility], [facility]  HPI/Subjective: Patient states that he is willing to undergo surgery.  With hemoglobin dropping down to 7.0 he is willing to undergo a blood transfusion today.  Patient feels that his blood counts are low secondary to his dry skin.  Feels his abdomen bloats.  He states he is having bowel movements.  He admitted with abdominal pain and bowel obstruction.  Objective: Vitals:   11/02/21 0317 11/02/21 0746  BP: 106/72 106/71  Pulse: 90 88  Resp: 20 18  Temp: 98 F (36.7 C) 98.6 F (37 C)  SpO2: 99% 100%    Intake/Output Summary (Last 24 hours) at 11/02/2021 1359 Last data filed at 11/02/2021 1022 Gross per 24 hour  Intake 1230 ml  Output 400 ml  Net 830 ml   Filed Weights   10/29/21 0710  Weight: 83.9 kg    ROS: Review of Systems  Respiratory:  Negative for shortness of breath.   Cardiovascular:  Negative for chest pain.  Gastrointestinal:  Negative for abdominal pain, nausea and vomiting.  Exam: Physical Exam HENT:     Head: Normocephalic.     Mouth/Throat:     Pharynx: No oropharyngeal exudate.  Eyes:     General: Lids are normal.     Conjunctiva/sclera: Conjunctivae normal.  Cardiovascular:     Rate and Rhythm: Normal rate and regular rhythm.     Heart sounds: Normal heart sounds, S1 normal and S2 normal.  Pulmonary:     Breath sounds: No decreased breath sounds, wheezing, rhonchi or rales.  Abdominal:     Palpations: Abdomen is soft.     Tenderness: There is no abdominal tenderness.  Musculoskeletal:     Right lower leg: No swelling.     Left lower leg: No swelling.  Skin:    General: Skin is warm.     Findings: No rash.  Neurological:     Mental Status: He is alert and oriented to person, place, and time.      Scheduled Meds:   sodium chloride   Intravenous Once   acetaminophen  650 mg Oral Once   carvedilol  3.125 mg Oral BID WC   docusate sodium  100 mg Oral BID   enalapril  5 mg Oral Daily   furosemide  20 mg Intravenous BID   insulin aspart  0-5 Units Subcutaneous QHS   insulin aspart  0-9 Units Subcutaneous TID WC   multivitamin with minerals  1 tablet Oral Daily   Ensure Max Protein  11 oz Oral BID   spironolactone  25 mg Oral Daily   Vitamin D (Ergocalciferol)  50,000 Units Oral Q7 days   Assessment/Plan:  Obstructing colon mass in the ascending colon.  Repeat urine toxicology today.  General surgery following.  Patient willing to undergo surgery to get better. Iron deficiency anemia likely of chronic blood loss.  Patient has received IV iron during the hospital course.  Since surgery is planned I will give 1 unit of packed red blood cells today.  Benefits and risks of transfusion explained with the patient and he consented for blood transfusion today.  Hemoglobin 6.7 today. Type 2 diabetes mellitus with hyperglycemia.  Last hemoglobin A1c 8.5.  Patient currently on sliding scale insulin.  Since patient n.p.o. tomorrow we will continue this for now History of CAD  on Coreg enalapril and Lasix Chronic systolic congestive heart failure.  No signs of heart failure currently. Hyponatremia.  Improved Polysubstance abuse.  Repeat urine toxicology needed prior to surgery to make sure cocaine is out of the system.    Code Status:     Code Status Orders  (From admission, onward)           Start     Ordered   10/29/21 1501  Full code  Continuous        10/29/21 1501           Code Status History     Date Active Date Inactive Code Status Order ID Comments User Context   03/24/2021 2331 04/05/2021 2330 Full Code [id number]  Christel Mormon, MD ED   03/11/2021 1542 03/14/2021 2328 Full Code [id number]  Ivor Costa, MD ED   02/12/2021 2312 02/19/2021 1543 Full Code [id number]  Rhetta Mura, DO Inpatient    10/04/2020 0323 10/05/2020 2345 Full Code [id number]  Mansy, Arvella Merles, MD ED   07/15/2020 2233 07/18/2020 0103 Full Code [id number]  Athena Masse, MD ED   09/09/2018 0639 09/10/2018 1810 Full Code [id number]  Yolonda Kida, MD Inpatient   09/09/2018 0639 09/09/2018 0639 Full Code [id number]  Arta Silence, MD Inpatient   06/02/2016 1135 06/02/2016 1521 Full Code [id number]  Dustin Flock, MD Inpatient      Family Communication: Declined Disposition Plan: Status is: Inpatient  Consultants: General surgery Cardiology  Time spent: 27 minutes  [place]

## 2021-11-03 ENCOUNTER — Encounter
Admission: EM | Discharge status: Against Medical Advice | Payer: Self-pay | Source: Home / Self Care | Attending: Internal Medicine

## 2021-11-03 ENCOUNTER — Encounter (HOSPITAL_COMMUNITY): Payer: Self-pay | Admitting: Registered Nurse

## 2021-11-03 ENCOUNTER — Ambulatory Visit: Admit: 2021-11-03 | Payer: MEDICAID

## 2021-11-03 ENCOUNTER — Encounter: Admit: 2021-11-03 | Discharge: 2021-11-11 | Payer: MEDICAID

## 2021-11-03 ENCOUNTER — Ambulatory Visit: Admit: 2021-11-03 | Discharge: 2021-11-11 | Disposition: A | Discharge status: Home / Self Care | Payer: MEDICAID

## 2021-11-03 ENCOUNTER — Encounter: Admit: 2021-11-03 | Payer: MEDICAID

## 2021-11-03 DIAGNOSIS — I10 Essential (primary) hypertension: Secondary | ICD-10-CM

## 2021-11-03 LAB — BPAM RBC
Blood Product Expiration Date: 202212082359
ISSUE DATE / TIME: 202211201542
Unit Type and Rh: 6200

## 2021-11-03 LAB — CBC
HCT: 27.8 % — ABNORMAL LOW (ref 39.0–52.0)
Hemoglobin: 8.2 g/dL — ABNORMAL LOW (ref 13.0–17.0)
MCH: 22.5 pg — ABNORMAL LOW (ref 26.0–34.0)
MCHC: 29.5 g/dL — ABNORMAL LOW (ref 30.0–36.0)
MCV: 76.2 fL — ABNORMAL LOW (ref 80.0–100.0)
Platelets: 284 10*3/uL (ref 150–400)
RBC: 3.65 MIL/uL — ABNORMAL LOW (ref 4.22–5.81)
RDW: 19.8 % — ABNORMAL HIGH (ref 11.5–15.5)
WBC: 9.8 10*3/uL (ref 4.0–10.5)
nRBC: 0 % (ref 0.0–0.2)

## 2021-11-03 LAB — TYPE AND SCREEN
ABO/RH(D): A POS
Antibody Screen: NEGATIVE
Unit division: 0

## 2021-11-03 LAB — GLUCOSE, CAPILLARY: Glucose-Capillary: 162 mg/dL — ABNORMAL HIGH (ref 70–99)

## 2021-11-03 LAB — MAGNESIUM: Magnesium: 1.8 mg/dL (ref 1.7–2.4)

## 2021-11-03 SURGERY — COLECTOMY, PARTIAL
Anesthesia: General | Laterality: Right

## 2021-11-03 SURGICAL SUPPLY — 40 items
BLADE CLIPPER SURG (BLADE) IMPLANT
CHLORAPREP W/TINT 26 (MISCELLANEOUS) ×2 IMPLANT
CLIP VESOCCLUDE LG 6/CT (CLIP) IMPLANT
CLIP VESOCCLUDE MED 6/CT (CLIP) IMPLANT
DRAPE INCISE IOBAN 66X45 STRL (DRAPES) ×2 IMPLANT
DRSG OPSITE POSTOP 4X10 (GAUZE/BANDAGES/DRESSINGS) IMPLANT
DRSG OPSITE POSTOP 4X8 (GAUZE/BANDAGES/DRESSINGS) IMPLANT
ELECT CAUTERY BLADE TIP 2.5 (TIP) ×2
ELECT EZSTD 165MM 6.5IN (MISCELLANEOUS) ×2
ELECT REM PT RETURN 9FT ADLT (ELECTROSURGICAL) ×2
ELECTRODE CAUTERY BLDE TIP 2.5 (TIP) ×1 IMPLANT
ELECTRODE EZSTD 165MM 6.5IN (MISCELLANEOUS) ×1 IMPLANT
ELECTRODE REM PT RTRN 9FT ADLT (ELECTROSURGICAL) ×1 IMPLANT
GAUZE 4X4 16PLY ~~LOC~~+RFID DBL (SPONGE) ×2 IMPLANT
GLOVE SURG ORTHO LTX SZ7.5 (GLOVE) ×8 IMPLANT
GOWN STRL REUS W/ TWL LRG LVL3 (GOWN DISPOSABLE) ×6 IMPLANT
GOWN STRL REUS W/TWL LRG LVL3 (GOWN DISPOSABLE) ×6
KIT TURNOVER KIT A (KITS) ×2 IMPLANT
LIGASURE IMPACT 36 18CM CVD LR (INSTRUMENTS) ×2 IMPLANT
MANIFOLD NEPTUNE II (INSTRUMENTS) ×2 IMPLANT
NS IRRIG 1000ML POUR BTL (IV SOLUTION) ×2 IMPLANT
PACK BASIN MAJOR ARMC (MISCELLANEOUS) ×2 IMPLANT
PACK COLON CLEAN CLOSURE (MISCELLANEOUS) ×2 IMPLANT
RELOAD PROXIMATE 75MM BLUE (ENDOMECHANICALS) IMPLANT
RELOAD STAPLER LINEAR PROX 30 (STAPLE) ×1 IMPLANT
SPONGE T-LAP 18X18 ~~LOC~~+RFID (SPONGE) ×8 IMPLANT
STAPLER GUN LINEAR PROX 60 (STAPLE) ×2 IMPLANT
STAPLER PROXIMATE 75MM BLUE (STAPLE) ×2 IMPLANT
STAPLER RELOAD LINEAR PROX 30 (STAPLE) ×2
STAPLER SKIN PROX 35W (STAPLE) ×2 IMPLANT
SUT CHROMIC 2 0 SH (SUTURE) IMPLANT
SUT PDS AB 0 CT1 27 (SUTURE) ×4 IMPLANT
SUT SILK 2 0 (SUTURE) ×1
SUT SILK 2-0 30XBRD TIE 12 (SUTURE) ×1 IMPLANT
SUT SILK 3-0 (SUTURE) ×2 IMPLANT
SUT VIC AB 3-0 SH 27 (SUTURE) ×2
SUT VIC AB 3-0 SH 27X BRD (SUTURE) ×2 IMPLANT
SYR BULB IRRIG 60ML STRL (SYRINGE) ×2 IMPLANT
TRAY FOLEY SLVR 16FR LF STAT (SET/KITS/TRAYS/PACK) ×2 IMPLANT
WATER STERILE IRR 500ML POUR (IV SOLUTION) ×2 IMPLANT

## 2021-11-03 NOTE — Progress Notes (Signed)
Walked into the room to tell the patient that we needed one more urine sample before surgery and patient became beligerant saying, "That's so disrespectful. I'm ready to move on to the next hospital." Girlfriend in the room sitting on bed with patient. Will continue to monitor the situation and let the doctor know.

## 2021-11-03 NOTE — Progress Notes (Signed)
Patient notified us 10 min ago that he was going to leave AMA. I called Security just to be on the safe side. Security officer Lennox Grumbles arrived shortly and the patient came out of the room dressed and ready to go, girlfriend behind him. Immediately recognized the security officer, chatted briefly while he signed his AMA papers and said goodbye. Officer escorted them out cordially.

## 2021-11-03 NOTE — Discharge Summary (Signed)
[place] [facility] [place] NAME: Joshua [name]    MR#:  [id number]  DATE OF BIRTH:  Jun 24, 1971  DATE OF ADMISSION:  10/29/2021 ADMITTING PHYSICIAN: Shawna Clamp, MD  DATE OF signing out [place]: 11/03/2021  1:17 PM  PRIMARY CARE PHYSICIAN: [facility], [place] DIAGNOSIS:  Colonic obstruction ([place]) [K56.609] SBO (small bowel obstruction) ([place]) [K56.609] Colonic mass [K63.89]  DISCHARGE DIAGNOSIS:  Principal Problem:   Colonic obstruction (HCC) Active Problems:   Acute metabolic encephalopathy   Chronic systolic CHF (congestive heart failure) (HCC)   HTN (hypertension)   Type 2 diabetes mellitus with hyperglycemia, with long-term current use of insulin (HCC)   Tobacco abuse   Polysubstance abuse (HCC)   Iron deficiency anemia due to chronic blood loss   Colonic mass   SECONDARY DIAGNOSIS:   Past Medical History:  Diagnosis Date   Abrasion of toe with infection, right, subsequent encounter 03/27/2021   CHF (congestive heart failure) ([place])    Diabetes mellitus without complication ([place])    Hypertension     HOSPITAL COURSE:   1.  Obstructing colon mass in the ascending colon.  Repeat urine toxicology negative last night.  Anesthesia wanted another urine toxicology this morning, prior to surgery.  Patient signed out [place].  This could be a likely cancerous process. 2.  Iron deficiency anemia from likely chronic blood loss.  Patient received IV iron during the hospital course.  I did give 1 unit of packed red blood cells with good response.  Ferritin 7.  Hemoglobin went from 6.7 up to 8.2. 3.  Type 2 diabetes mellitus with hyperglycemia.  Last hemoglobin A1c 8.5.  In the hospital he was on sliding scale insulin.  4.  History of CAD on Coreg, enalapril and Lasix 5.  Chronic systolic congestive heart failure.  No signs of heart failure during the hospital stay..  Patient on Coreg  enalapril and Lasix. 6.  Hyponatremia improved 7.  Polysubstance abuse.  Urine analysis from last night negative.  Anesthesia wanted another urine toxicology today and then patient signed out [place]. 8.  Vitamin D deficiency started on vitamin D while here in the hospital but left AGAINST MEDICAL ADVICE.  DISCHARGE CONDITIONS:   Guarded  CONSULTS OBTAINED:  Treatment Team:  Ronny Bacon, MD  DRUG ALLERGIES:   Allergies  Allergen Reactions   Penicillins Other (See Comments)    Patient unsure of allergy    DISCHARGE MEDICATIONS:   Allergies as of 11/03/2021       Reactions   Penicillins Other (See Comments)   Patient unsure of allergy        Medication List     STOP taking these medications    dapagliflozin propanediol 5 MG Tabs tablet Commonly known as: FARXIGA   fluticasone 50 MCG/ACT nasal spray Commonly known as: FLONASE   gabapentin 100 MG capsule Commonly known as: NEURONTIN       TAKE these medications    ascorbic acid 250 MG tablet Commonly known as: VITAMIN C Take 1 tablet (250 mg total) by mouth 2 (two) times daily.   buPROPion 150 MG 24 hr tablet Commonly known as: WELLBUTRIN XL Take 150 mg by mouth daily as needed.   carvedilol 3.125 MG tablet Commonly known as: COREG Take 1 tablet (3.125 mg total) by mouth 2 (two) times daily with a meal.   DULoxetine 30 MG capsule Commonly known as: CYMBALTA Take 30  mg by mouth daily.   furosemide 20 MG tablet Commonly known as: Lasix Take 1 tablet (20 mg total) by mouth daily as needed (for weight gain >3lbs in 1 days and >5lbs in 2 days).   insulin glargine 100 UNIT/ML Solostar Pen Commonly known as: LANTUS Inject 40 Units into the skin daily.   losartan 25 MG tablet Commonly known as: Cozaar Take 1 tablet (25 mg total) by mouth daily.   metFORMIN 500 MG tablet Commonly known as: Glucophage Take 1 tablet (500 mg total) by mouth daily with breakfast.   multivitamin  with minerals Tabs tablet Take 1 tablet by mouth daily.   Pen Needles 31G X 5 MM Misc 40 Units by Does not apply route at bedtime.   polyethylene glycol powder 17 GM/SCOOP powder Commonly known as: GLYCOLAX/MIRALAX Take 17 g by mouth daily.   spironolactone 25 MG tablet Commonly known as: ALDACTONE Take 1 tablet (25 mg total) by mouth daily.         DISCHARGE INSTRUCTIONS:   Follow-up PMD  Today   CHIEF COMPLAINT:   Chief Complaint  Patient presents with   Abdominal Pain    HISTORY OF PRESENT ILLNESS:  Joshua [name]  is a 50 y.o. male came in with abdominal pain and found to have obstructing colon mass   VITAL SIGNS:  Blood pressure 110/73, pulse 92, temperature 98.1 F (36.7 C), temperature source Oral, resp. rate 18, height 5\' 10"  (1.778 m), weight 83.9 kg, SpO2 100 %.  I/O:   Intake/Output Summary (Last 24 hours) at 11/03/2021 1444 Last data filed at 11/02/2021 2000 Gross per 24 hour  Intake 320 ml  Output 400 ml  Net -80 ml    PHYSICAL EXAMINATION:  GENERAL:  50 y.o.-year-old patient lying in the bed with no acute distress.  EYES: Pupils equal, round, reactive to light and accommodation. No scleral icterus.  HEENT: Head atraumatic, normocephalic. Oropharynx and nasopharynx clear.  LUNGS: Normal breath sounds bilaterally, no wheezing, rales,rhonchi or crepitation. No use of accessory muscles of respiration.  CARDIOVASCULAR: S1, S2 normal. No murmurs, rubs, or gallops.  ABDOMEN: Soft, non-tender, non-distended.  EXTREMITIES: No pedal edema.  NEUROLOGIC: Cranial nerves II through XII are intact. Muscle strength 5/5 in all extremities. Sensation intact. Gait not checked.  PSYCHIATRIC: The patient is alert and oriented x 3.  SKIN: No obvious rash, lesion, or ulcer.   DATA REVIEW:   CBC Recent Labs  Lab 11/03/21 0314  WBC 9.8  HGB 8.2*  HCT 27.8*  PLT 284    Chemistries  Recent Labs  Lab 11/02/21 0630 11/03/21 0314  NA 135  --   K 3.6  --    CL 103  --   CO2 26  --   GLUCOSE 157*  --   BUN 8  --   CREATININE 0.91  --   CALCIUM 8.3*  --   MG 1.8 1.8  AST 13*  --   ALT 7  --   ALKPHOS 62  --   BILITOT 0.1*  --      Microbiology Results  Results for orders placed or performed during the hospital encounter of 10/29/21  Resp Panel by RT-PCR (Flu A&B, Covid) Nasopharyngeal Swab     Status: None   Collection Time: 10/29/21  7:25 AM   Specimen: Nasopharyngeal Swab; Nasopharyngeal(NP) swabs in vial transport medium  Result Value Ref Range Status   SARS Coronavirus 2 by RT PCR NEGATIVE NEGATIVE Final    Comment: (NOTE) SARS-CoV-2  target nucleic acids are NOT DETECTED.  The SARS-CoV-2 RNA is generally detectable in upper respiratory specimens during the acute phase of infection. The lowest concentration of SARS-CoV-2 viral copies this assay can detect is 138 copies/mL. A negative result does not preclude SARS-Cov-2 infection and should not be used as the sole basis for treatment or other patient management decisions. A negative result may occur with  improper specimen collection/handling, submission of specimen other than nasopharyngeal swab, presence of viral mutation(s) within the areas targeted by this assay, and inadequate number of viral copies(<138 copies/mL). A negative result must be combined with clinical observations, patient history, and epidemiological information. The expected result is Negative.  Fact Sheet for Patients:  EntrepreneurPulse.com.au  Fact Sheet for Healthcare Providers:  IncredibleEmployment.be  This test is no t yet approved or cleared by the [place] FDA and  has been authorized for detection and/or diagnosis of SARS-CoV-2 by FDA under an Emergency Use Authorization (EUA). This EUA will remain  in effect (meaning this test can be used) for the duration of the COVID-19 declaration under Section 564(b)(1) of the Act, 21 U.S.C.section  360bbb-3(b)(1), unless the authorization is terminated  or revoked sooner.       Influenza A by PCR NEGATIVE NEGATIVE Final   Influenza B by PCR NEGATIVE NEGATIVE Final    Comment: (NOTE) The Xpert Xpress SARS-CoV-2/FLU/RSV plus assay is intended as an aid in the diagnosis of influenza from Nasopharyngeal swab specimens and should not be used as a sole basis for treatment. Nasal washings and aspirates are unacceptable for Xpert Xpress SARS-CoV-2/FLU/RSV testing.  Fact Sheet for Patients: EntrepreneurPulse.com.au  Fact Sheet for Healthcare Providers: IncredibleEmployment.be  This test is not yet approved or cleared by the [place] FDA and has been authorized for detection and/or diagnosis of SARS-CoV-2 by FDA under an Emergency Use Authorization (EUA). This EUA will remain in effect (meaning this test can be used) for the duration of the COVID-19 declaration under Section 564(b)(1) of the Act, 21 U.S.C. section 360bbb-3(b)(1), unless the authorization is terminated or revoked.  Performed at [facility], [address] [place], [place] [address]     Management plans discussed with the patient and he decided not to follow through with the plans and signed out [place]:     Code Status Orders  (From admission, onward)           Start     Ordered   10/29/21 1501  Full code  Continuous        10/29/21 1501           Code Status History     Date Active Date Inactive Code Status Order ID Comments User Context   03/24/2021 2331 04/05/2021 2330 Full Code [id number]  Christel Mormon, MD ED   03/11/2021 1542 03/14/2021 2328 Full Code [id number]  Ivor Costa, MD ED   02/12/2021 2312 02/19/2021 1543 Full Code [id number]  Rhetta Mura, DO Inpatient   10/04/2020 0323 10/05/2020 2345 Full Code [id number]  Mansy, Arvella Merles, MD ED   07/15/2020 2233 07/18/2020 0103 Full Code [id number]  Athena Masse, MD ED    09/09/2018 0639 09/10/2018 1810 Full Code [id number]  Yolonda Kida, MD Inpatient   09/09/2018 0639 09/09/2018 0639 Full Code [id number]  Arta Silence, MD Inpatient   06/02/2016 1135 06/02/2016 1521 Full Code [id number]  Dustin Flock, MD Inpatient       TOTAL TIME TAKING CARE OF THIS PATIENT: 54  minutes.    Loletha Grayer M.D on 11/03/2021 at 2:44 PM   [facility]  CC: Primary care physician; [facility], [facility]

## 2021-11-03 NOTE — Progress Notes (Signed)
Received call from surgery nurse with routine pre-op questions. She asked if the patient has had any visitors this morning and I notified her that girlfriend arrived almost an hour and a half ago. She asked if I could please get another urine drug screen just to ensure there were no illicit drugs in his system. I agreed and went to go obtain.

## 2021-11-03 NOTE — Progress Notes (Signed)
Patient's girlfriend "Cookie" arrived to the patient's room with a small bag. I told the girlfriend that he was having surgery and that it was imperative that he not be given anything. She acknowledged.

## 2021-11-11 MED ORDER — ACETAMINOPHEN 325 MG TABLET
ORAL_TABLET | Freq: Four times a day (QID) | ORAL | 0 refills | 4.00000 days | Status: CP | PRN
Start: 2021-11-11 — End: 2021-11-16
  Filled 2021-11-11: qty 30, 4d supply, fill #0

## 2021-11-11 MED ORDER — BUPROPION HCL XL 150 MG 24 HR TABLET, EXTENDED RELEASE
ORAL_TABLET | Freq: Every day | ORAL | 0 refills | 30.00000 days | Status: CP
Start: 2021-11-11 — End: 2021-12-11
  Filled 2021-11-11: qty 30, 30d supply, fill #0

## 2021-11-11 MED ORDER — POLYETHYLENE GLYCOL 3350 17 GRAM/DOSE ORAL POWDER
Freq: Every day | ORAL | 0 refills | 14.00000 days | Status: CP
Start: 2021-11-11 — End: 2021-11-25
  Filled 2021-11-11: qty 238, 14d supply, fill #0

## 2021-11-13 HISTORY — PX: OTHER SURGICAL HISTORY: SHX169

## 2021-11-16 ENCOUNTER — Other Ambulatory Visit: Payer: Self-pay

## 2021-11-16 ENCOUNTER — Ambulatory Visit (HOSPITAL_COMMUNITY)
Admission: AD | Admit: 2021-11-16 | Discharge: 2021-11-16 | Disposition: A | Discharge status: Home / Self Care | Payer: Medicaid Other | Source: Other Acute Inpatient Hospital | Attending: Emergency Medicine | Admitting: Emergency Medicine

## 2021-11-16 ENCOUNTER — Emergency Department: Payer: Medicaid Other

## 2021-11-16 ENCOUNTER — Emergency Department
Admission: EM | Admit: 2021-11-16 | Discharge: 2021-11-16 | Disposition: A | Discharge status: Other Acute Inpatient Hospital | Payer: Medicaid Other | Attending: Emergency Medicine | Admitting: Emergency Medicine

## 2021-11-16 DIAGNOSIS — F17200 Nicotine dependence, unspecified, uncomplicated: Secondary | ICD-10-CM | POA: Diagnosis not present

## 2021-11-16 DIAGNOSIS — K56609 Unspecified intestinal obstruction, unspecified as to partial versus complete obstruction: Secondary | ICD-10-CM | POA: Diagnosis present

## 2021-11-16 DIAGNOSIS — R109 Unspecified abdominal pain: Secondary | ICD-10-CM

## 2021-11-16 DIAGNOSIS — I110 Hypertensive heart disease with heart failure: Secondary | ICD-10-CM | POA: Diagnosis not present

## 2021-11-16 DIAGNOSIS — Z79899 Other long term (current) drug therapy: Secondary | ICD-10-CM | POA: Insufficient documentation

## 2021-11-16 DIAGNOSIS — Z20822 Contact with and (suspected) exposure to covid-19: Secondary | ICD-10-CM | POA: Diagnosis not present

## 2021-11-16 DIAGNOSIS — I2510 Atherosclerotic heart disease of native coronary artery without angina pectoris: Secondary | ICD-10-CM | POA: Diagnosis not present

## 2021-11-16 DIAGNOSIS — E119 Type 2 diabetes mellitus without complications: Secondary | ICD-10-CM | POA: Diagnosis not present

## 2021-11-16 DIAGNOSIS — I5022 Chronic systolic (congestive) heart failure: Secondary | ICD-10-CM | POA: Insufficient documentation

## 2021-11-16 DIAGNOSIS — Z794 Long term (current) use of insulin: Secondary | ICD-10-CM | POA: Diagnosis not present

## 2021-11-16 DIAGNOSIS — Z7984 Long term (current) use of oral hypoglycemic drugs: Secondary | ICD-10-CM | POA: Insufficient documentation

## 2021-11-16 DIAGNOSIS — C189 Malignant neoplasm of colon, unspecified: Secondary | ICD-10-CM | POA: Diagnosis not present

## 2021-11-16 LAB — BRAIN NATRIURETIC PEPTIDE: B Natriuretic Peptide: 180.5 pg/mL — ABNORMAL HIGH (ref 0.0–100.0)

## 2021-11-16 LAB — COMPREHENSIVE METABOLIC PANEL
ALT: 13 U/L (ref 0–44)
AST: 15 U/L (ref 15–41)
Albumin: 4.1 g/dL (ref 3.5–5.0)
Alkaline Phosphatase: 111 U/L (ref 38–126)
Anion gap: 12 (ref 5–15)
BUN: 14 mg/dL (ref 6–20)
CO2: 27 mmol/L (ref 22–32)
Calcium: 9.9 mg/dL (ref 8.9–10.3)
Chloride: 98 mmol/L (ref 98–111)
Creatinine, Ser: 1.51 mg/dL — ABNORMAL HIGH (ref 0.61–1.24)
GFR, Estimated: 56 mL/min — ABNORMAL LOW (ref >60)
Glucose, Bld: 209 mg/dL — ABNORMAL HIGH (ref 70–99)
Potassium: 4.1 mmol/L (ref 3.5–5.1)
Sodium: 137 mmol/L (ref 135–145)
Total Bilirubin: 0.4 mg/dL (ref 0.3–1.2)
Total Protein: 8.9 g/dL — ABNORMAL HIGH (ref 6.5–8.1)

## 2021-11-16 LAB — TROPONIN I (HIGH SENSITIVITY)
Troponin I (High Sensitivity): 8 ng/L (ref <18)
Troponin I (High Sensitivity): 6 ng/L (ref <18)

## 2021-11-16 LAB — RESP PANEL BY RT-PCR (FLU A&B, COVID) ARPGX2
Influenza A by PCR: NEGATIVE
Influenza B by PCR: NEGATIVE
SARS Coronavirus 2 by RT PCR: NEGATIVE

## 2021-11-16 LAB — CBC
HCT: 41.3 % (ref 39.0–52.0)
Hemoglobin: 12.2 g/dL — ABNORMAL LOW (ref 13.0–17.0)
MCH: 23.3 pg — ABNORMAL LOW (ref 26.0–34.0)
MCHC: 29.5 g/dL — ABNORMAL LOW (ref 30.0–36.0)
MCV: 79 fL — ABNORMAL LOW (ref 80.0–100.0)
Platelets: 884 10*3/uL — ABNORMAL HIGH (ref 150–400)
RBC: 5.23 MIL/uL (ref 4.22–5.81)
RDW: 24.2 % — ABNORMAL HIGH (ref 11.5–15.5)
WBC: 17.1 10*3/uL — ABNORMAL HIGH (ref 4.0–10.5)
nRBC: 0 % (ref 0.0–0.2)

## 2021-11-16 LAB — ETHANOL: Alcohol, Ethyl (B): <10 mg/dL (ref <10)

## 2021-11-16 LAB — LIPASE, BLOOD: Lipase: 21 U/L (ref 11–51)

## 2021-11-16 LAB — MAGNESIUM: Magnesium: 2.4 mg/dL (ref 1.7–2.4)

## 2021-11-16 LAB — LACTIC ACID, PLASMA: Lactic Acid, Venous: 2.7 mmol/L (ref 0.5–1.9)

## 2021-11-16 IMAGING — DX DG ABDOMEN 1V
1 series · 1 of 1 positions shown · non-contrast
Comparison: [DATE]

CLINICAL DATA: Nasogastric tube placement

EXAM:
ABDOMEN - 1 VIEW

[abdomen supine]
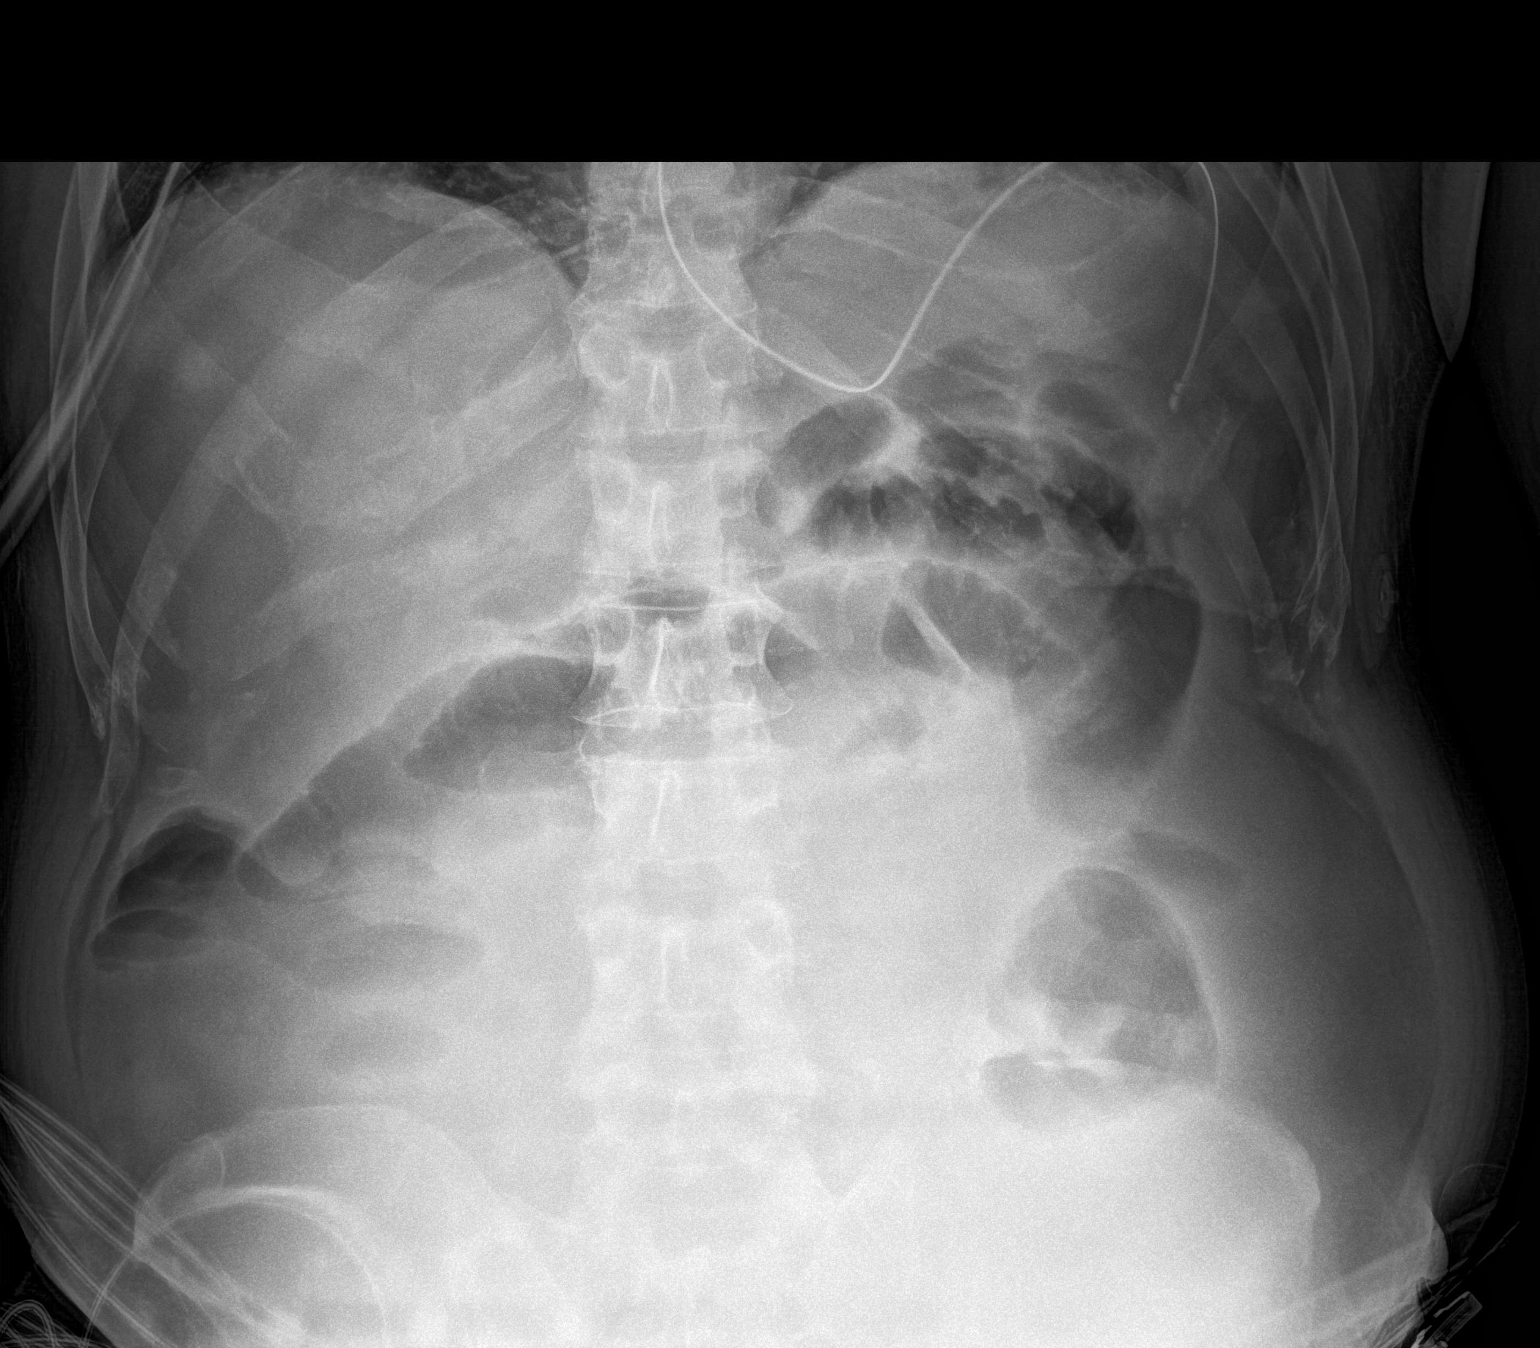

[1 of 1 positions shown; findings below may reference images not displayed]

FINDINGS: Nasogastric tube appears coiled within the gastric fundus. Multiple
gas-filled mildly dilated loops of small bowel are seen
demonstrating numerous air-fluid levels in keeping with a distal
small bowel obstruction better seen on prior CT examination. No
organomegaly. Pelvis excluded from view. No free intraperitoneal
gas.
IMPRESSION: Nasogastric tube coiled within the gastric fundus.

## 2021-11-16 IMAGING — CT CT ABD-PELV W/ CM
2 of 5 series · 14 of 46 positions shown, 16 images · IV contrast (APPLIED)
Comparison: [DATE]

CLINICAL DATA: Abdominal distension.

Suspected pulmonary embolus
EXAM:
CT ANGIOGRAPHY CHEST
CT ABDOMEN AND PELVIS WITH CONTRAST
TECHNIQUE: Multidetector CT imaging of the chest was performed using the
standard protocol during bolus administration of intravenous
contrast. Multiplanar CT image reconstructions and MIPs were
obtained to evaluate the vascular anatomy. Multidetector CT imaging
of the abdomen and pelvis was performed using the standard protocol
during bolus administration of intravenous contrast.
CONTRAST:  80mL OMNIPAQUE IOHEXOL 350 MG/ML SOLN

[Series 2: axial st · axial · 0.82mm/px · z∈[-980,-515]mm · 11 of 105 slices shown, 13 images]
[im 6/105  soft-tissue]
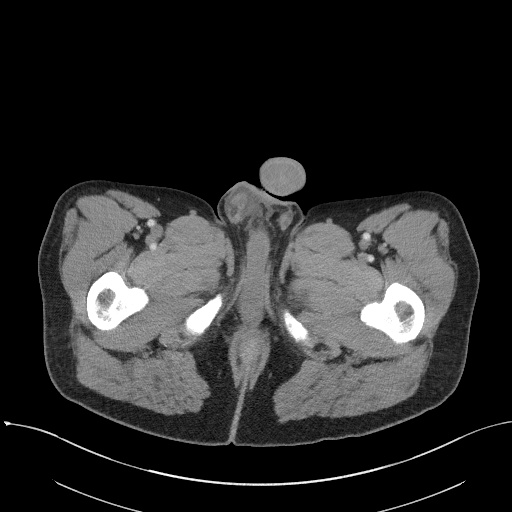
[im 6/105  bone]
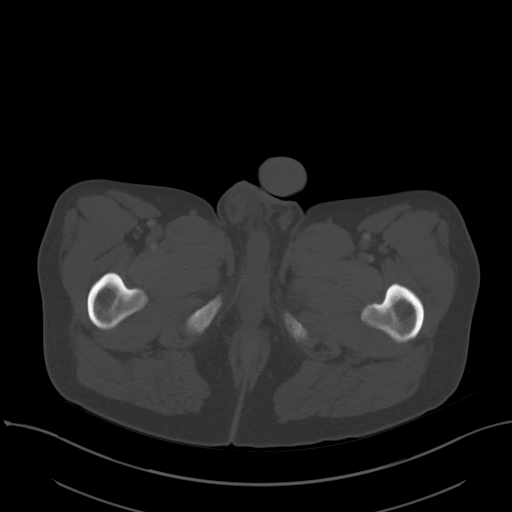
[im 17/105  soft-tissue]
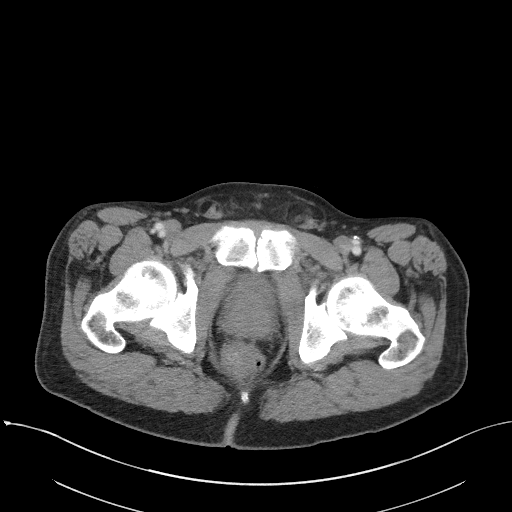
[im 28/105  soft-tissue]
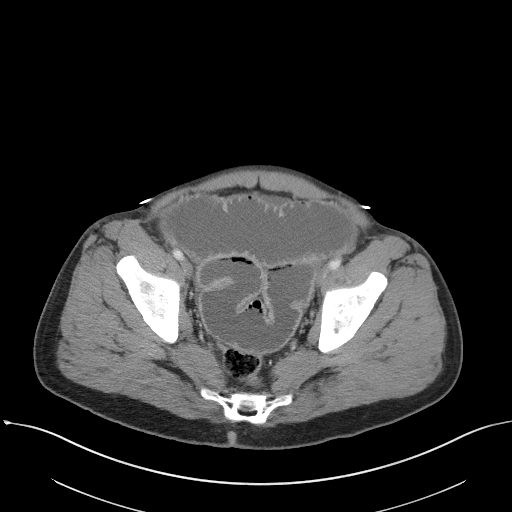
[im 33/105  soft-tissue]
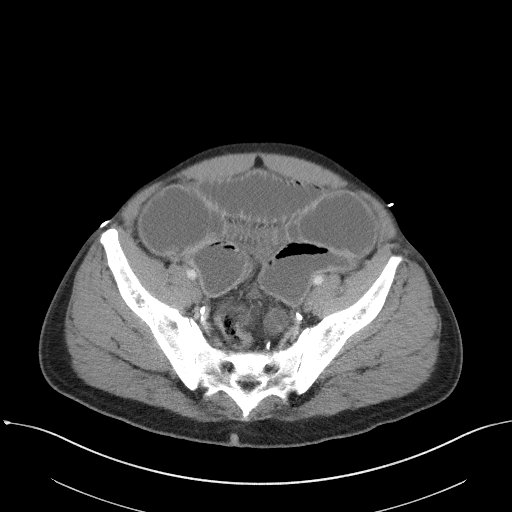
[im 44/105  soft-tissue]
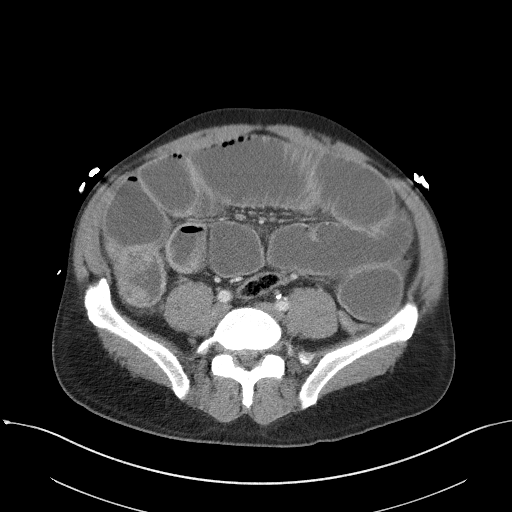
[im 55/105  soft-tissue]
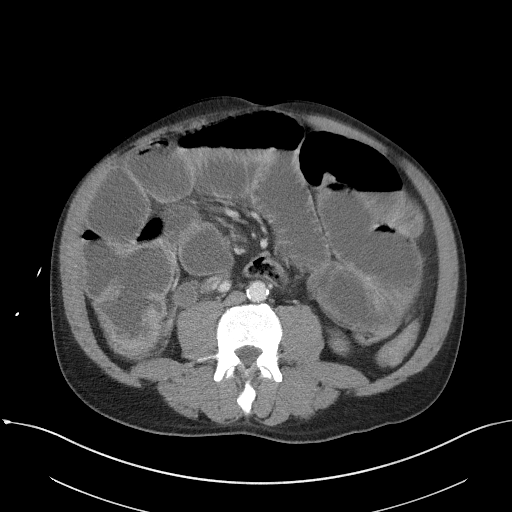
[im 61/105  soft-tissue]
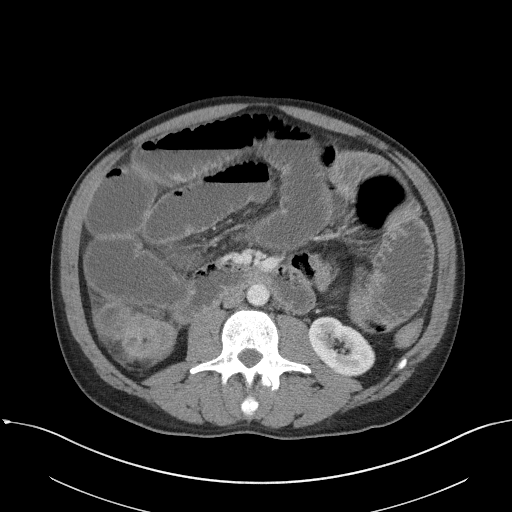
[im 72/105  soft-tissue]
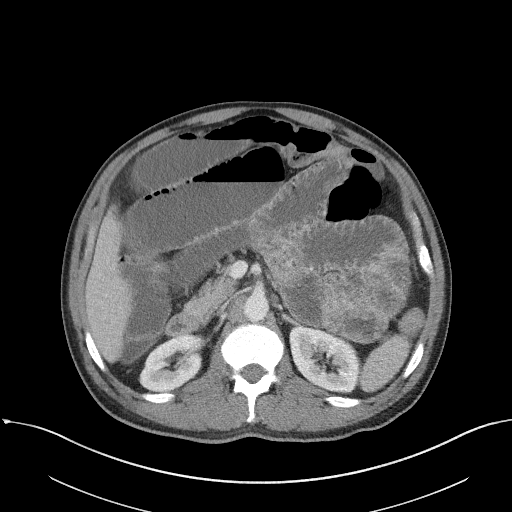
[im 77/105  soft-tissue]
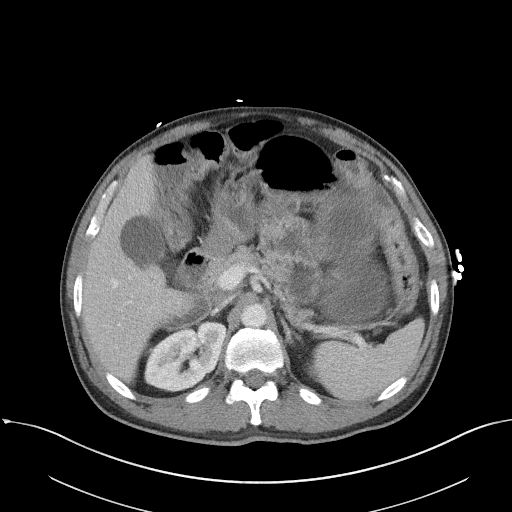
[im 77/105  bone]
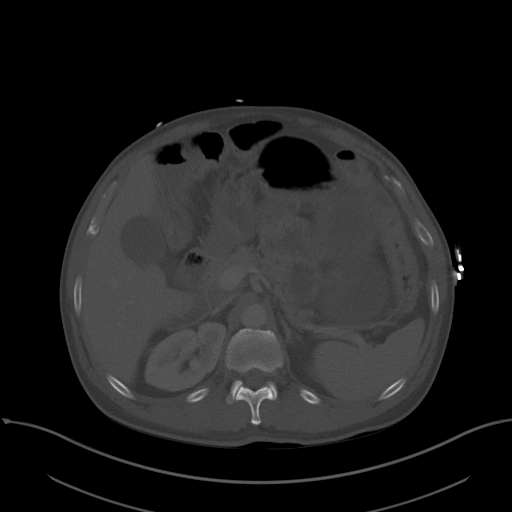
[im 88/105  soft-tissue]
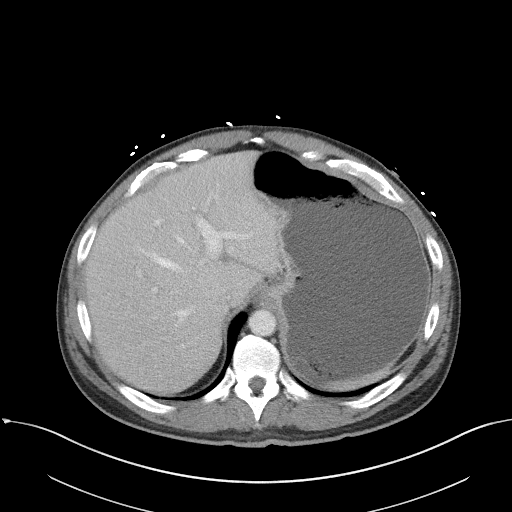
[im 99/105  soft-tissue]
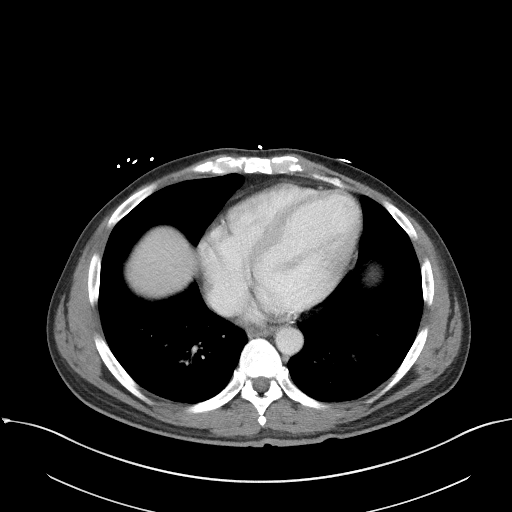

[Series 5: coronal st · coronal · 0.72mm/px · 3 of 99 slices shown]
[im 33/99  soft-tissue]
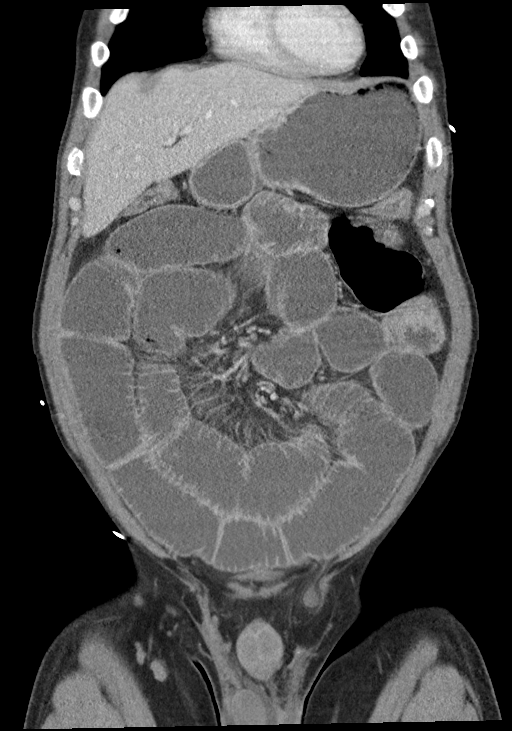
[im 44/99  soft-tissue]
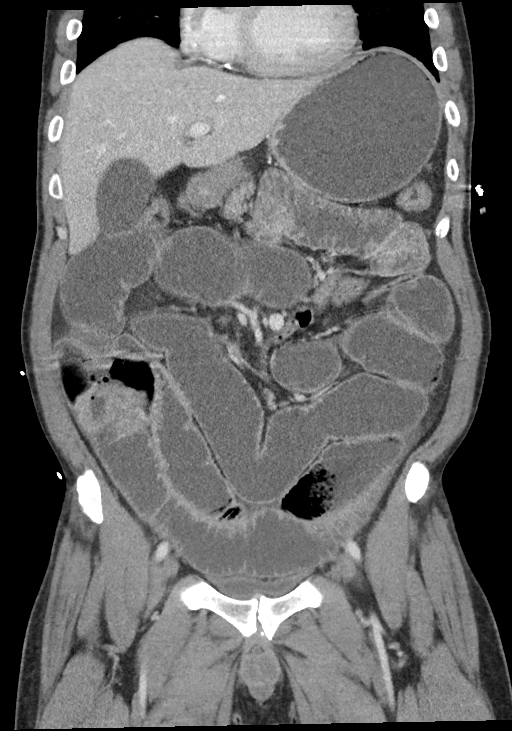
[im 55/99  soft-tissue]
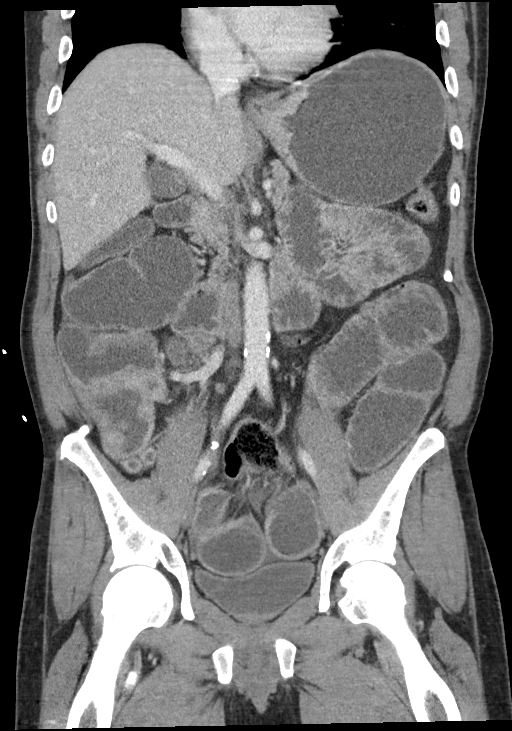

[14 of 46 positions shown; findings below may reference images not displayed]

FINDINGS: CTA CHEST FINDINGS

Cardiovascular: Satisfactory opacification of the pulmonary arteries
to the segmental level. No evidence of pulmonary embolism. Normal
heart size. No pericardial effusion. Calcific atherosclerotic
disease of the coronary arteries.

Mediastinum/Nodes: No enlarged mediastinal, hilar, or axillary lymph
nodes. Thyroid gland and trachea demonstrate no significant
findings. However dilation of the esophagus which is fluid-filled.

Lungs/Pleura: Lungs are clear. No pleural effusion or pneumothorax.

Musculoskeletal: No chest wall abnormality. No acute or significant
osseous findings.

Review of the MIP images confirms the above findings.

CT ABDOMEN and PELVIS FINDINGS

Hepatobiliary: No focal liver abnormality is seen. No gallstones,
gallbladder wall thickening, or biliary dilatation.

Pancreas: Unremarkable. No pancreatic ductal dilatation or
surrounding inflammatory changes.

Spleen: Normal in size without focal abnormality.

Adrenals/Urinary Tract: Adrenal glands are unremarkable. Kidneys are
normal, without renal calculi, focal lesion, or hydronephrosis.
Bladder is unremarkable.

Stomach/Bowel: Diffuse fluid-filled pathologically distended small
bowel loops to the level of the lose cecal junction with maximal
diameter of 5.1 cm the appendix is also fluid-filled and mildly
distended. The previously described irregular circumferential soft
tissue mass in the ascending colon is again seen. There is
additionally a second irregular soft tissue mass at the base of the
cecum, at the ileocecal valve which measures approximately 4.9 by
2.6 by 5.4 cm. The transverse and descending colon are decompressed.

Vascular/Lymphatic: Low-density likely necrotic ileocolic lymph
node. Hepato duodenal lymphadenopathy redemonstrated.

Reproductive: Prostate is unremarkable.

Other: No abdominal wall hernia or abnormality. No abdominopelvic
ascites.

Musculoskeletal: No acute or significant osseous findings.

Review of the MIP images confirms the above findings.
IMPRESSION: 1. No evidence of pulmonary embolus.
2. No evidence of acute abnormalities within the chest.
3. High-grade small bowel obstruction to the level of the ileocecal
valve, worse than on the comparison CT exam dated [DATE].
4. Persistent irregular circumferential soft tissue mass in the
ascending colon, and a second irregular soft tissue mass at the base
of the cecum, at the ileocecal valve, both highly suspicious for
malignancy.
5. Hepato duodenal and ileocolic lymphadenopathy, likely metastatic.
6. Calcific atherosclerotic disease of the coronary arteries.

## 2021-11-16 IMAGING — CT CT ANGIO CHEST
2 of 7 series · 16 of 46 positions shown · IV contrast (APPLIED)
Comparison: [DATE]

CLINICAL DATA: Abdominal distension.

Suspected pulmonary embolus
EXAM:
CT ANGIOGRAPHY CHEST
CT ABDOMEN AND PELVIS WITH CONTRAST
TECHNIQUE: Multidetector CT imaging of the chest was performed using the
standard protocol during bolus administration of intravenous
contrast. Multiplanar CT image reconstructions and MIPs were
obtained to evaluate the vascular anatomy. Multidetector CT imaging
of the abdomen and pelvis was performed using the standard protocol
during bolus administration of intravenous contrast.
CONTRAST:  80mL OMNIPAQUE IOHEXOL 350 MG/ML SOLN

[Series 5: thins · axial · 0.77mm/px · z∈[-597,-347]mm · 13 of 282 slices shown]
[im 16/282  lung]
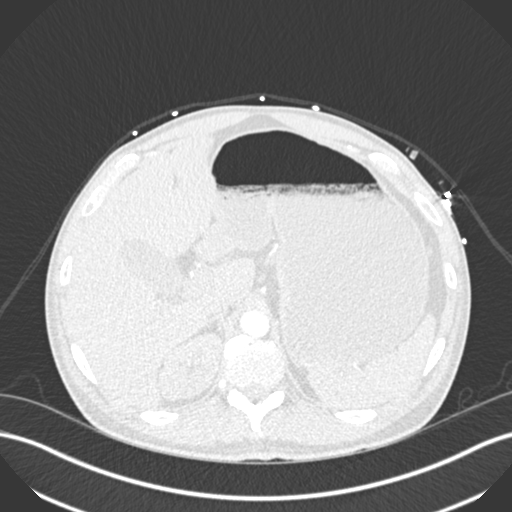
[im 32/282  soft-tissue]
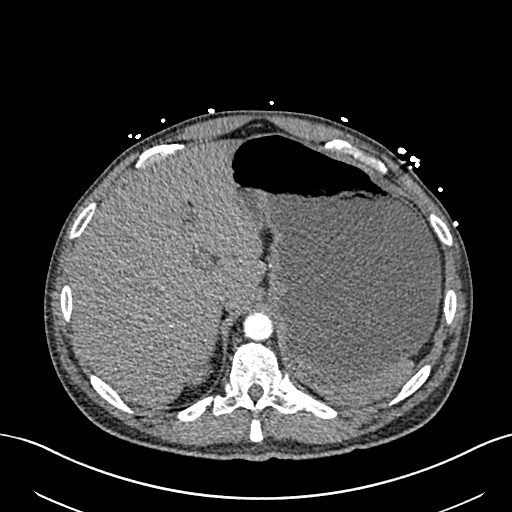
[im 63/282  lung]
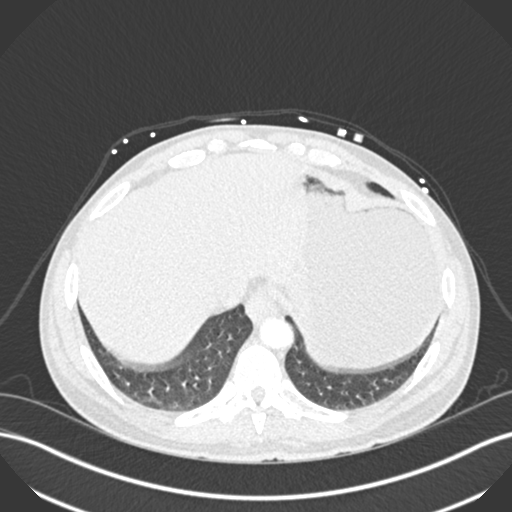
[im 79/282  soft-tissue]
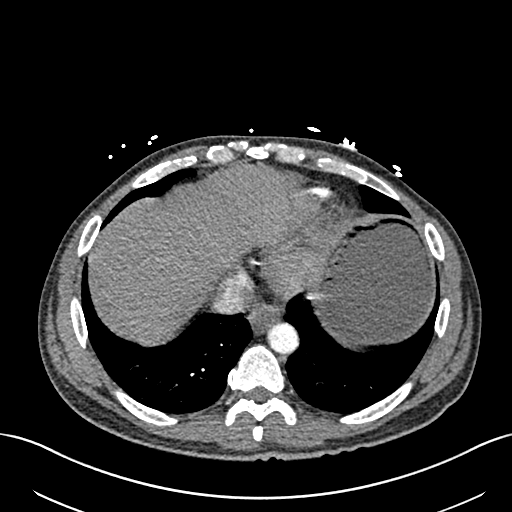
[im 94/282  lung]
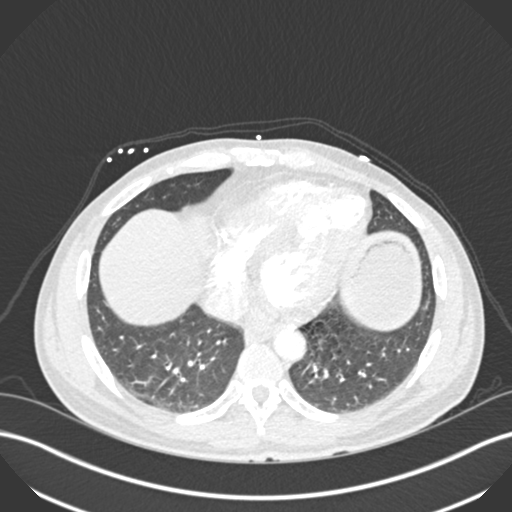
[im 125/282  soft-tissue]
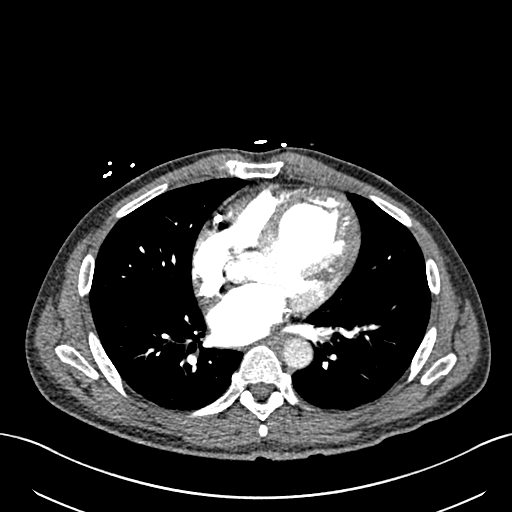
[im 141/282  lung]
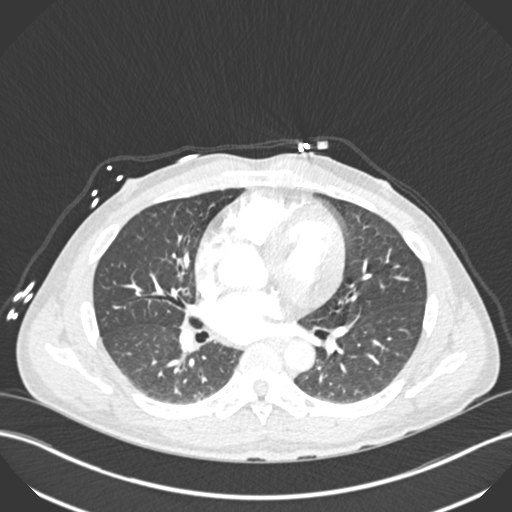
[im 157/282  soft-tissue]
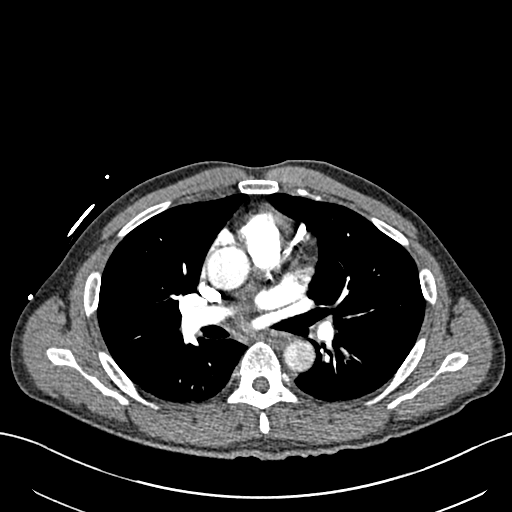
[im 188/282  lung]
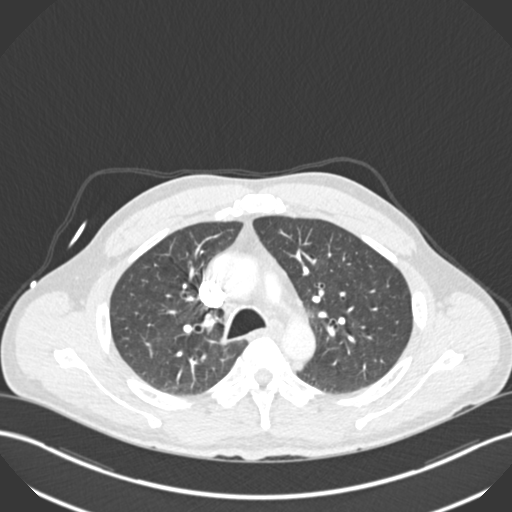
[im 203/282  soft-tissue]
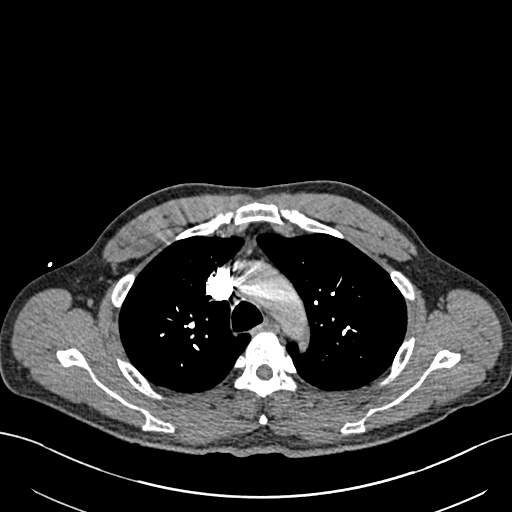
[im 219/282  lung]
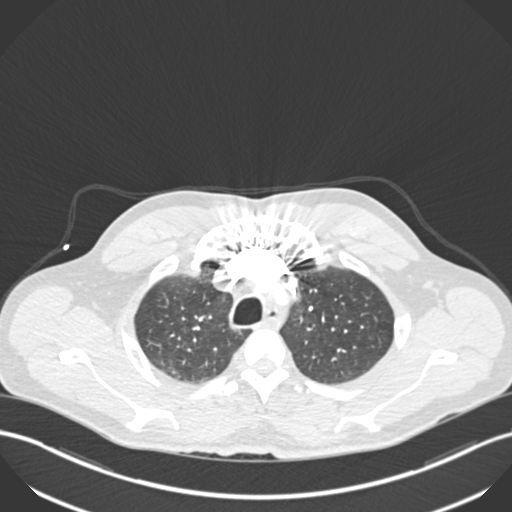
[im 250/282  soft-tissue]
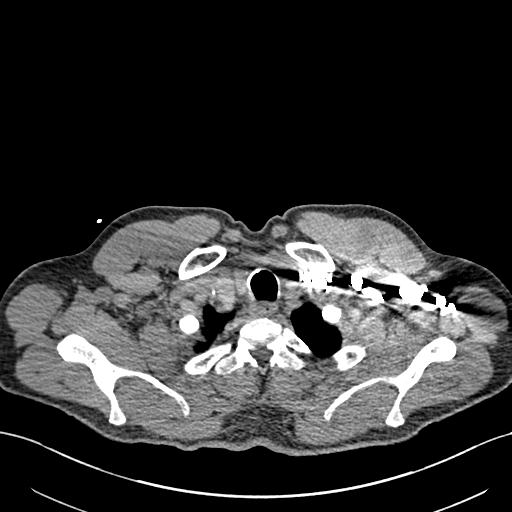
[im 266/282  lung]
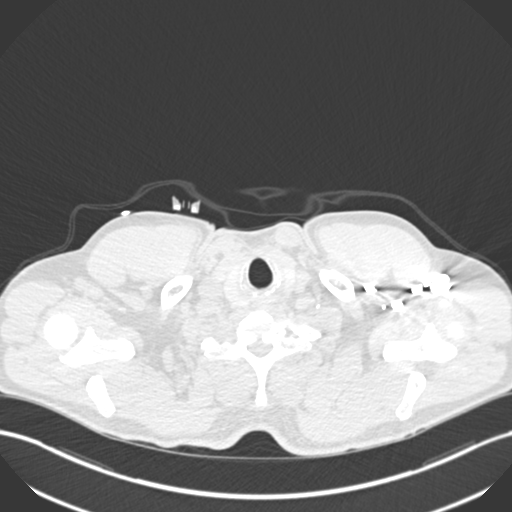

[Series 7: coronal mpr · coronal · 0.56mm/px · 3 of 139 slices shown]
[im 35/139  soft-tissue]
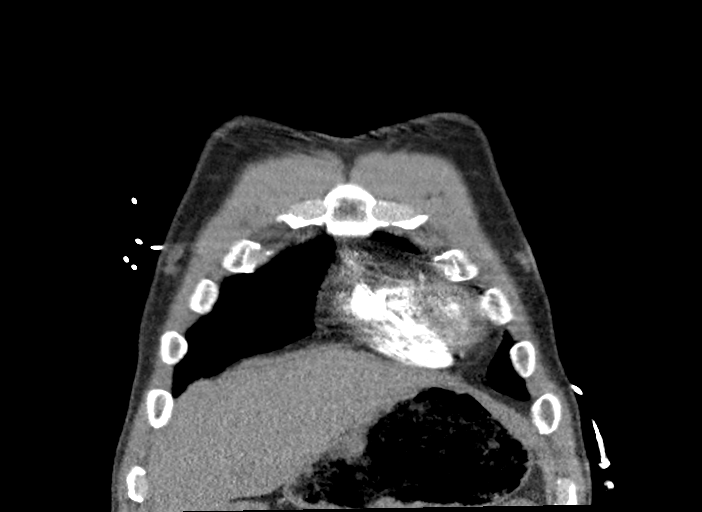
[im 70/139  soft-tissue]
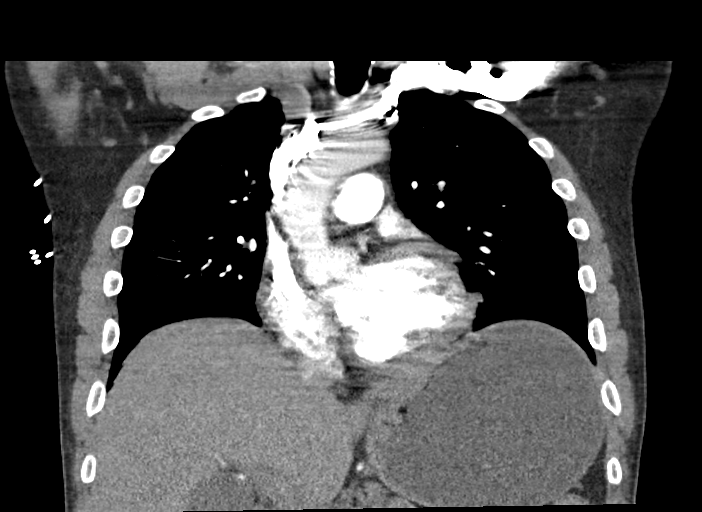
[im 104/139  soft-tissue]
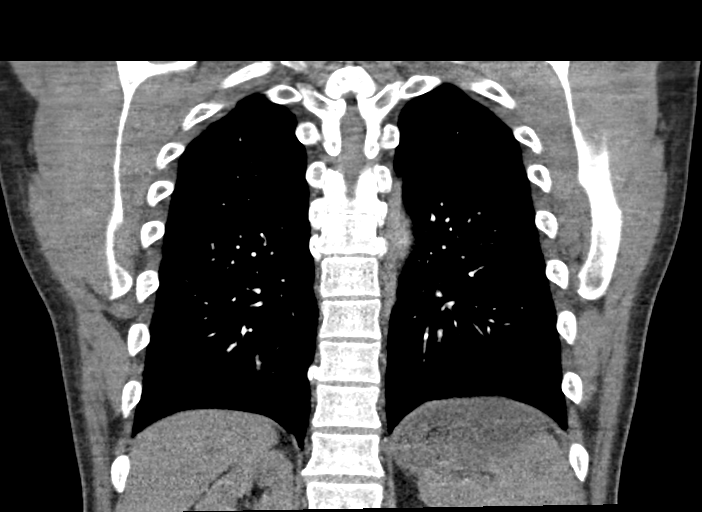

[16 of 46 positions shown; findings below may reference images not displayed]

FINDINGS: CTA CHEST FINDINGS

Cardiovascular: Satisfactory opacification of the pulmonary arteries
to the segmental level. No evidence of pulmonary embolism. Normal
heart size. No pericardial effusion. Calcific atherosclerotic
disease of the coronary arteries.

Mediastinum/Nodes: No enlarged mediastinal, hilar, or axillary lymph
nodes. Thyroid gland and trachea demonstrate no significant
findings. However dilation of the esophagus which is fluid-filled.

Lungs/Pleura: Lungs are clear. No pleural effusion or pneumothorax.

Musculoskeletal: No chest wall abnormality. No acute or significant
osseous findings.

Review of the MIP images confirms the above findings.

CT ABDOMEN and PELVIS FINDINGS

Hepatobiliary: No focal liver abnormality is seen. No gallstones,
gallbladder wall thickening, or biliary dilatation.

Pancreas: Unremarkable. No pancreatic ductal dilatation or
surrounding inflammatory changes.

Spleen: Normal in size without focal abnormality.

Adrenals/Urinary Tract: Adrenal glands are unremarkable. Kidneys are
normal, without renal calculi, focal lesion, or hydronephrosis.
Bladder is unremarkable.

Stomach/Bowel: Diffuse fluid-filled pathologically distended small
bowel loops to the level of the lose cecal junction with maximal
diameter of 5.1 cm the appendix is also fluid-filled and mildly
distended. The previously described irregular circumferential soft
tissue mass in the ascending colon is again seen. There is
additionally a second irregular soft tissue mass at the base of the
cecum, at the ileocecal valve which measures approximately 4.9 by
2.6 by 5.4 cm. The transverse and descending colon are decompressed.

Vascular/Lymphatic: Low-density likely necrotic ileocolic lymph
node. Hepato duodenal lymphadenopathy redemonstrated.

Reproductive: Prostate is unremarkable.

Other: No abdominal wall hernia or abnormality. No abdominopelvic
ascites.

Musculoskeletal: No acute or significant osseous findings.

Review of the MIP images confirms the above findings.
IMPRESSION: 1. No evidence of pulmonary embolus.
2. No evidence of acute abnormalities within the chest.
3. High-grade small bowel obstruction to the level of the ileocecal
valve, worse than on the comparison CT exam dated [DATE].
4. Persistent irregular circumferential soft tissue mass in the
ascending colon, and a second irregular soft tissue mass at the base
of the cecum, at the ileocecal valve, both highly suspicious for
malignancy.
5. Hepato duodenal and ileocolic lymphadenopathy, likely metastatic.
6. Calcific atherosclerotic disease of the coronary arteries.

## 2021-11-16 MED ORDER — VANCOMYCIN HCL IN DEXTROSE 1-5 GM/200ML-% IV SOLN: 1000.0000 mg | Freq: Once | INTRAVENOUS | Status: DC

## 2021-11-16 MED ORDER — SODIUM CHLORIDE 0.9 % IV BOLUS
500.0000 mL | Freq: Once | INTRAVENOUS | Status: AC
  Administered 2021-11-16: 14:00:00 500 mL via INTRAVENOUS

## 2021-11-16 MED ORDER — METRONIDAZOLE 500 MG/100ML IV SOLN
500.0000 mg | Freq: Once | INTRAVENOUS | Status: AC
  Administered 2021-11-16: 16:00:00 500 mg via INTRAVENOUS
  Filled 2021-11-16: qty 100

## 2021-11-16 MED ORDER — HYDROMORPHONE HCL 1 MG/ML IJ SOLN
0.5000 mg | Freq: Once | INTRAMUSCULAR | Status: AC | PRN
  Administered 2021-11-16: 16:00:00 0.5 mg via INTRAVENOUS
  Filled 2021-11-16: qty 1

## 2021-11-16 MED ORDER — VANCOMYCIN HCL 1500 MG/300ML IV SOLN
1500.0000 mg | Freq: Once | INTRAVENOUS | Status: AC
  Administered 2021-11-16: 16:00:00 1500 mg via INTRAVENOUS
  Filled 2021-11-16: qty 300

## 2021-11-16 MED ORDER — HYDROMORPHONE HCL 1 MG/ML IJ SOLN
0.5000 mg | Freq: Once | INTRAMUSCULAR | Status: AC
  Administered 2021-11-16: 14:00:00 0.5 mg via INTRAVENOUS
  Filled 2021-11-16: qty 1

## 2021-11-16 MED ORDER — IOHEXOL 350 MG/ML SOLN
80.0000 mL | Freq: Once | INTRAVENOUS | Status: AC | PRN
  Administered 2021-11-16: 15:00:00 80 mL via INTRAVENOUS

## 2021-11-16 MED ORDER — SODIUM CHLORIDE 0.9 % IV BOLUS
500.0000 mL | Freq: Once | INTRAVENOUS | Status: AC
  Administered 2021-11-16: 21:00:00 500 mL via INTRAVENOUS

## 2021-11-16 MED ORDER — ONDANSETRON HCL 4 MG/2ML IJ SOLN
4.0000 mg | Freq: Once | INTRAMUSCULAR | Status: AC
  Administered 2021-11-16: 14:00:00 4 mg via INTRAVENOUS
  Filled 2021-11-16: qty 2

## 2021-11-16 MED ORDER — SODIUM CHLORIDE 0.9 % IV SOLN
2.0000 g | Freq: Once | INTRAVENOUS | Status: AC
  Administered 2021-11-16: 16:00:00 2 g via INTRAVENOUS
  Filled 2021-11-16: qty 2

## 2021-11-16 MED ORDER — SODIUM CHLORIDE 0.9 % IV SOLN
12.5000 mg | Freq: Once | INTRAVENOUS | Status: AC
  Administered 2021-11-16: 16:00:00 12.5 mg via INTRAVENOUS
  Filled 2021-11-16: qty 0.5

## 2021-11-16 MED ORDER — SODIUM CHLORIDE 0.9 % IV SOLN: 2.0000 g | Freq: Once | INTRAVENOUS | Status: DC

## 2021-11-16 NOTE — Progress Notes (Signed)
PHARMACY -  BRIEF ANTIBIOTIC NOTE   Pharmacy has received consult(s) for aztreonam and vancomycin from an ED provider.  The patient's profile has been reviewed for ht/wt/allergies/indication/available labs.    One time order(s) placed for   1) vancomycin 1500 mg IV x 1  2) cefepime 2 grams IV x 1 based on CHG protocol (patient has previously tolerated cephalosporins)  Further antibiotics/pharmacy consults should be ordered by admitting physician if indicated.                       Thank you, Dallie Piles 11/16/2021  2:38 PM

## 2021-11-16 NOTE — ED Notes (Signed)
EMTALA Reviewed by this RN.  

## 2021-11-16 NOTE — ED Triage Notes (Signed)
Pt arrives via EMS from home for abd pain- pt has a hx of colon cancer and has a known blockage that he is going to have surgery for on the 8th- pt abd very distended

## 2021-11-16 NOTE — ED Provider Notes (Addendum)
[facility] Emergency Department Provider Note  ____________________________________________   Event Date/Time   First MD Initiated Contact with Patient 11/16/21 1350     (approximate)  I have reviewed the triage vital signs and the nursing notes.   HISTORY  Chief Complaint Abdominal Pain    HPI Joshua [name] is a 50 y.o. male with history of diabetes, CHF with EF of 25%, colon cancer who comes in with concern for abdominal pain.  Patient reports sudden onset severe pain in his abdomen starting yesterday, constant, nothing makes it better or worse.  Also reports some shortness of breath associated with the.  Unclear when this started.  Denies any falls, hitting his head.  Patient is denying any substance abuse or alcohol abuse to me.   To note patient was admitted from 10/31-11/16 for small bowel obstruction secondary to obstructing colon mass.  They were planning to do surgery but patient left AGAINST MEDICAL ADVICE.  He was requiring IV iron secondary to chronic blood loss.  He has a history of polysubstance abuse and he wanted another urine tox screen and he did not want to give it.  He then was added on 11/21 at [facility] and they were planning to do outpatient surgery in a few days.          Past Medical History:  Diagnosis Date   Abrasion of toe with infection, right, subsequent encounter 03/27/2021   CHF (congestive heart failure) (HCC)    Diabetes mellitus without complication ([place])    Hypertension     Patient Active Problem List   Diagnosis Date Noted   Colonic mass    Iron deficiency anemia due to chronic blood loss    Colonic obstruction ([place]) 10/29/2021   Acute osteomyelitis of toe, right ([place]) 03/24/2021   Community acquired pneumonia 03/11/2021   CAD (coronary artery disease) 03/11/2021   Sepsis ([place]) 03/11/2021   Chest pain 03/11/2021   HTN (hypertension) 03/11/2021   Type 2 diabetes mellitus with hyperglycemia, with long-term  current use of insulin ([place]) 03/11/2021   Tobacco abuse 03/11/2021   Polysubstance abuse ([place])    Osteomyelitis of great toe of right foot ([place]) 02/18/2021   Malnutrition of moderate degree 02/14/2021   Cocaine abuse ([place]) 02/13/2021   Homeless 02/13/2021   Cellulitis of great toe of right foot 02/12/2021   Diabetic foot ulcer ([place]) 02/12/2021   Lactic acidosis 09/73/5329   Acute metabolic encephalopathy 92/42/6834   Chronic systolic CHF (congestive heart failure) ([place]) 02/12/2021   SIRS (systemic inflammatory response syndrome) ([place]) 10/04/2020   Acute respiratory failure with hypoxia ([place]) 07/15/2020   Multifocal pneumonia 07/15/2020   History of MI (myocardial infarction) 07/15/2020   Symptomatic anemia 07/15/2020   Acute ST elevation myocardial infarction (STEMI) involving left anterior descending (LAD) coronary artery ([place]) 09/09/2018   STEMI involving left anterior descending coronary artery ([place]) 09/09/2018   Hypoglycemia 06/02/2016    Past Surgical History:  Procedure Laterality Date   AMPUTATION TOE Right 04/01/2021   Procedure: AMPUTATION TOE;  Surgeon: Sharlotte Alamo, DPM;  Location: [facility] ORS;  Service: Podiatry;  Laterality: Right;  RIGHT GREAT TOE   CORONARY/GRAFT ACUTE MI REVASCULARIZATION N/A 09/09/2018   Procedure: Coronary/Graft Acute MI Revascularization;  Surgeon: Yolonda Kida, MD;  Location: [place] CV LAB;  Service: Cardiovascular;  Laterality: N/A;   LEFT HEART CATH AND CORONARY ANGIOGRAPHY N/A 09/09/2018   Procedure: LEFT HEART CATH AND CORONARY ANGIOGRAPHY;  Surgeon: Yolonda Kida, MD;  Location: [facility]  INVASIVE CV LAB;  Service: Cardiovascular;  Laterality: N/A;   LOWER EXTREMITY ANGIOGRAPHY Right 02/14/2021   Procedure: Lower Extremity Angiography;  Surgeon: Katha Cabal, MD;  Location: [place] CV LAB;  Service: Cardiovascular;  Laterality: Right;   none     STENT PLACEMENT VASCULAR ([facility] HX)      Prior to Admission medications    Medication Sig Start Date End Date Taking? Authorizing Provider  ascorbic acid (VITAMIN C) 250 MG tablet Take 1 tablet (250 mg total) by mouth 2 (two) times daily. 02/19/21   Lorella Nimrod, MD  buPROPion (WELLBUTRIN XL) 150 MG 24 hr tablet Take 150 mg by mouth daily as needed. 10/16/21   [provider]  carvedilol (COREG) 3.125 MG tablet Take 1 tablet (3.125 mg total) by mouth 2 (two) times daily with a meal. 02/19/21   Lorella Nimrod, MD  DULoxetine (CYMBALTA) 30 MG capsule Take 30 mg by mouth daily. 10/16/21   [provider]  furosemide (LASIX) 20 MG tablet Take 1 tablet (20 mg total) by mouth daily as needed (for weight gain >3lbs in 1 days and >5lbs in 2 days). 02/19/21 02/19/22  Lorella Nimrod, MD  insulin glargine (LANTUS) 100 UNIT/ML Solostar Pen Inject 40 Units into the skin daily. 02/19/21   Lorella Nimrod, MD  Insulin Pen Needle (PEN NEEDLES) 31G X 5 MM MISC 40 Units by Does not apply route at bedtime. 02/19/21   Lorella Nimrod, MD  losartan (COZAAR) 25 MG tablet Take 1 tablet (25 mg total) by mouth daily. 02/19/21 02/19/22  Lorella Nimrod, MD  metFORMIN (GLUCOPHAGE) 500 MG tablet Take 1 tablet (500 mg total) by mouth daily with breakfast. 02/19/21 04/20/21  Lorella Nimrod, MD  Multiple Vitamin (MULTIVITAMIN WITH MINERALS) TABS tablet Take 1 tablet by mouth daily. 02/19/21   Lorella Nimrod, MD  polyethylene glycol powder (GLYCOLAX/MIRALAX) 17 GM/SCOOP powder Take 17 g by mouth daily. 10/16/21   [provider]  spironolactone (ALDACTONE) 25 MG tablet Take 1 tablet (25 mg total) by mouth daily. 02/19/21   Lorella Nimrod, MD    Allergies Penicillins  Family History  Problem Relation Age of Onset   Cancer Mother    CAD Brother     Social History Social History   Tobacco Use   Smoking status: Every Day   Smokeless tobacco: Never  Vaping Use   Vaping Use: Never used  Substance Use Topics   Alcohol use: Yes    Alcohol/week: 0.0 standard drinks   Drug use: Not Currently    Comment:  pt states no found unknown white substance in mouth      Review of Systems Constitutional: No fever/chills Eyes: No visual changes. ENT: No sore throat. Cardiovascular: Denies chest pain. Respiratory: Positive shortness of breath Gastrointestinal: Positive abdominal pain, nausea, vomiting Genitourinary: Negative for dysuria. Musculoskeletal: Negative for back pain. Skin: Negative for rash. Neurological: Negative for headaches, focal weakness or numbness. All other ROS negative ____________________________________________   PHYSICAL EXAM:  VITAL SIGNS: ED Triage Vitals [11/16/21 1343]  Enc Vitals Group     BP 97/64     Pulse Rate (!) 111     Resp 19     Temp      Temp src      SpO2 98 %     Weight      Height      Head Circumference      Peak Flow      Pain Score 10     Pain Loc  Pain Edu?      Excl. in [place]?     Constitutional: Alert and oriented.  Appears uncomfortable Eyes: Conjunctivae are normal. EOMI. Head: Atraumatic. Nose: No congestion/rhinnorhea. Mouth/Throat: Mucous membranes are moist.   Neck: No stridor. Trachea Midline. FROM Cardiovascular: Tachycardic, regular rhythm. Grossly normal heart sounds.  Good peripheral circulation. Respiratory: Normal respiratory effort.  No retractions. Lungs CTAB. Gastrointestinal: Distended, tender throughout no abdominal bruits.  Musculoskeletal: No lower extremity tenderness nor edema.  No joint effusions. Neurologic:  Normal speech and language. No gross focal neurologic deficits are appreciated.  Skin:  Skin is warm, dry and intact. No rash noted. Psychiatric: Mood and affect are normal. Speech and behavior are normal. GU: Deferred   ____________________________________________   LABS (all labs ordered are listed, but only abnormal results are displayed)  Labs Reviewed  RESP PANEL BY RT-PCR (FLU A&B, COVID) ARPGX2  LIPASE, BLOOD  COMPREHENSIVE METABOLIC PANEL  CBC  URINALYSIS, ROUTINE W REFLEX  MICROSCOPIC  ETHANOL  URINE DRUG SCREEN, QUALITATIVE ([facility] ONLY)  BRAIN NATRIURETIC PEPTIDE  MAGNESIUM  LACTIC ACID, PLASMA  LACTIC ACID, PLASMA  TROPONIN I (HIGH SENSITIVITY)   ____________________________________________   ED ECG REPORT I, Vanessa [place], the attending physician, personally viewed and interpreted this ECG.  Sinus tachycardia rate of 104, no ST elevation, T wave version aVL, normal intervals ____________________________________________  RADIOLOGY Robert Bellow, personally viewed and evaluated these images (plain radiographs) as part of my medical decision making, as well as reviewing the written report by the radiologist.  ED MD interpretation:  pending  Official radiology report(s): No results found.  ____________________________________________   PROCEDURES  Procedure(s) performed (including Critical Care):  .1-3 Lead EKG Interpretation Performed by: Vanessa [place], MD Authorized by: Vanessa Ponce, MD     Interpretation: abnormal     ECG rate:  100s   ECG rate assessment: tachycardic     Rhythm: sinus tachycardia     Ectopy: none     Conduction: normal   .Critical Care Performed by: Vanessa [place], MD Authorized by: Vanessa [place], MD   Critical care provider statement:    Critical care time (minutes):  30   Critical care was necessary to treat or prevent imminent or life-threatening deterioration of the following conditions:  Sepsis   Critical care was time spent personally by me on the following activities:  Development of treatment plan with patient or surrogate, discussions with consultants, evaluation of patient's response to treatment, examination of patient, ordering and review of laboratory studies, ordering and review of radiographic studies, ordering and performing treatments and interventions, pulse oximetry, re-evaluation of patient's condition and review of old charts   ____________________________________________   INITIAL  IMPRESSION / ASSESSMENT AND PLAN / ED COURSE  Reeder Jaqua Ching was evaluated in Emergency Department on 11/16/2021 for the symptoms described in the history of present illness. He was evaluated in the context of the global COVID-19 pandemic, which necessitated consideration that the patient might be at risk for infection with the SARS-CoV-2 virus that causes COVID-19. Institutional protocols and algorithms that pertain to the evaluation of patients at risk for COVID-19 are in a state of rapid change based on information released by regulatory bodies including the CDC and federal and state organizations. These policies and algorithms were followed during the patient's care in the ED.     Comes in with severe abdominal pain in the setting of known cancer and shortness of breath.  Suspect that shortness of breath  is most likely just from the distention and the pain but given his cancer history will also get CT PE to evaluate for pulmonary embolism.  Will get CT abdomen to evaluate for obstruction, perforation.  Patient slightly hypotensive and patient given 500 cc of fluid.  Do not want to fluid overload due to his history of CHF but no evidence of CHF on my exam at this time.  Labs are ordered to evaluate for any electrolyte abnormalities, AKI Patient was given 1 dose of IV Dilaudid, Zofran to help with symptoms  Patient will be handed off to oncoming team pending these results  2:33 PM blood pressure is elevated after cc bolus.  Do not want to do full fluid resuscitation given low EF we will continue to closely monitor.  White count significantly high and patient meet sepsis criteria.  Patient started on broad-spectrum antibiotics in case he has a perforation.  Given his allergy to penicillin unable to do Zosyn.     ____________________________________________   FINAL CLINICAL IMPRESSION(S) / ED DIAGNOSES   Final diagnoses:  Abdominal pain, unspecified abdominal location  Malignant neoplasm  of colon, unspecified part of colon ([place])      MEDICATIONS GIVEN DURING THIS VISIT:  Medications  sodium chloride 0.9 % bolus 500 mL (500 mLs Intravenous New Bag/Given 11/16/21 1358)  ondansetron (ZOFRAN) injection 4 mg (4 mg Intravenous Given 11/16/21 1355)  HYDROmorphone (DILAUDID) injection 0.5 mg (0.5 mg Intravenous Given 11/16/21 1355)     ED Discharge Orders     None        Note:  This document was prepared using Dragon voice recognition software and may include unintentional dictation errors.    Vanessa [place], MD 11/16/21 1427    Vanessa [place], MD 11/16/21 [phone]

## 2021-11-16 NOTE — ED Provider Notes (Signed)
Procedures     ----------------------------------------- 7:22 PM on 11/16/2021 -----------------------------------------  CT shows recurrent SBO related to patient's colon cancer.  EMR reviewed, patient had biopsy at [facility] from colonoscopy last week which demonstrates poorly differentiated adenocarcinoma.  NG tube placed with good return.  Patient's care discussed with [facility] surgical oncology Dr. Hollice Espy who accepts the patient for transfer for ongoing management.    Carrie Mew, MD 11/16/21 [phone]

## 2021-11-16 NOTE — ED Notes (Signed)
Report called to [facility], spoke with Erlene Quan RN ([facility]) and Merrilee Seashore RN 6W.   [facility] unable to provide transport tonight, [facility] to be contacted by [place].

## 2021-11-17 ENCOUNTER — Ambulatory Visit
Admit: 2021-11-17 | Discharge: 2021-12-02 | Disposition: A | Discharge status: Home / Self Care | Payer: MEDICAID | Source: Other Acute Inpatient Hospital

## 2021-11-17 ENCOUNTER — Ambulatory Visit: Admit: 2021-11-17 | Payer: MEDICAID

## 2021-11-17 ENCOUNTER — Encounter: Admit: 2021-11-17 | Payer: MEDICAID | Attending: Surgical Oncology

## 2021-11-17 ENCOUNTER — Encounter: Admit: 2021-11-17 | Payer: MEDICAID

## 2021-11-21 LAB — CULTURE, BLOOD (ROUTINE X 2)
Culture: NO GROWTH
Culture: NO GROWTH
Special Requests: ADEQUATE
Special Requests: ADEQUATE

## 2021-12-02 MED ORDER — METHOCARBAMOL 500 MG TABLET
ORAL_TABLET | Freq: Four times a day (QID) | ORAL | 0 refills | 5.00000 days | Status: CP | PRN
Start: 2021-12-02 — End: ?
  Filled 2021-12-02: qty 20, 5d supply, fill #0

## 2021-12-02 MED ORDER — CARVEDILOL 3.125 MG TABLET
ORAL_TABLET | Freq: Two times a day (BID) | ORAL | 0 refills | 30.00000 days | Status: CP
Start: 2021-12-02 — End: 2022-01-01
  Filled 2021-12-02: qty 60, 30d supply, fill #0

## 2021-12-02 MED ORDER — MULTIVITAMIN-IRON 9 MG-FOLIC ACID 400 MCG-CALCIUM AND MINERALS TABLET
ORAL_TABLET | Freq: Every day | ORAL | 0 refills | 130.00000 days | Status: CP
Start: 2021-12-02 — End: ?
  Filled 2021-12-02: qty 130, 130d supply, fill #0

## 2021-12-02 MED ORDER — POLYETHYLENE GLYCOL 3350 17 GRAM/DOSE ORAL POWDER
Freq: Every day | ORAL | 2 refills | 14.00000 days | Status: CP
Start: 2021-12-02 — End: 2022-01-01
  Filled 2021-12-02: qty 238, 14d supply, fill #0

## 2021-12-02 MED ORDER — IBUPROFEN 400 MG TABLET
ORAL_TABLET | Freq: Four times a day (QID) | ORAL | 0 refills | 8.00000 days | Status: CP
Start: 2021-12-02 — End: ?
  Filled 2021-12-02: qty 30, 8d supply, fill #0

## 2021-12-02 MED ORDER — OXYCODONE 5 MG TABLET
ORAL_TABLET | ORAL | 0 refills | 2.00000 days | Status: CP | PRN
Start: 2021-12-02 — End: 2021-12-07
  Filled 2021-12-02: qty 10, 1d supply, fill #0

## 2021-12-03 MED ORDER — ENOXAPARIN 40 MG/0.4 ML SUBCUTANEOUS SYRINGE
SUBCUTANEOUS | 0 refills | 15.00000 days | Status: CP
Start: 2021-12-03 — End: 2021-12-18
  Filled 2021-12-02: qty 6, 15d supply, fill #0

## 2021-12-18 ENCOUNTER — Ambulatory Visit: Admit: 2021-12-18 | Discharge: 2021-12-19 | Payer: MEDICAID

## 2021-12-18 DIAGNOSIS — Z85038 Personal history of other malignant neoplasm of large intestine: Principal | ICD-10-CM

## 2021-12-23 DIAGNOSIS — Z9049 Acquired absence of other specified parts of digestive tract: Principal | ICD-10-CM

## 2021-12-23 DIAGNOSIS — Z85038 Personal history of other malignant neoplasm of large intestine: Principal | ICD-10-CM

## 2021-12-26 ENCOUNTER — Ambulatory Visit: Admit: 2021-12-26 | Payer: MEDICAID

## 2021-12-31 ENCOUNTER — Ambulatory Visit: Admit: 2021-12-31 | Discharge: 2022-01-01 | Payer: MEDICAID

## 2021-12-31 DIAGNOSIS — Z85038 Personal history of other malignant neoplasm of large intestine: Principal | ICD-10-CM

## 2021-12-31 DIAGNOSIS — I509 Heart failure, unspecified: Principal | ICD-10-CM

## 2021-12-31 DIAGNOSIS — D509 Iron deficiency anemia, unspecified: Principal | ICD-10-CM

## 2021-12-31 DIAGNOSIS — C189 Malignant neoplasm of colon, unspecified: Principal | ICD-10-CM

## 2022-01-05 NOTE — Unmapped (Signed)
DIVISION OF CARDIOLOGY   [employer] [facility] [place], [place]        Date of Service: 01/09/2022    Assessment/Plan   1. HFrEF (heart failure with reduced ejection fraction) (CMS-HCC)  Patient with presumed ischemic cardiomyopathy with reduced LV systolic function following STEMI.  Most recent LVEF from 11/04/21 showed EF 20-25% (down from 25-30% per report from August 2021). Currently, ACC/AHA HF Stage C, NHYA Class II. Appears euvolemic today, not currently on diuretic therapy.  Has been out of his medications for a few days to weeks- he cannot recall when he last took medications. Will restart losartan today. [[Previously on coreg 3.125 mg BID, lasix 20 mg daily, losartan 25 mg daily, spironolactone 25 mg daily, and farxiga 5 mg daily]]. Referral to Cardiology Pharmacists to assist with re-initiation  of HF medication management   - GDMT: restart ARB- losartan 12.5 mg daily  - losartan (COZAAR) 25 MG tablet; Take 1/2 tablet (12.5 mg total) by mouth daily.  Dispense: 45 tablet; Refill: 3  - Basic Metabolic Panel  - Ambulatory referral to Clinical Pharmacist; Future    2. Coronary artery disease involving native coronary artery of native heart without angina pectoris  History of CAD and multiple PCIs (mLAD, RCA 2019). Chest pain he describes today is nonexertional, and atypical in nature. Will continue with aspirin, and will restart BB and statin as he is financially able.   - aspirin (ECOTRIN) 81 MG tablet; Take 1 tablet (81 mg total) by mouth daily.  Dispense: 90 tablet; Refill: 3    3. Left ventricular aneurysm  Small apical aneurysm present on echocardiograms following his PCI in 2019. Had recent TTE from 10/2021, which showed stable apical aneurysm without thrombus. Will continue BP control.    4. Tobacco use  Current cigarette smoker, down to a couple per day. Not interested in smoking cessation at this time- mentions this is how he copes with his life situation.     Return to clinic:    Return in about 3 weeks (around 01/30/2022) for Phone Visit.    I personally spent 39 minutes face-to-face and non-face-to-face in the care of this patient, which includes all pre, intra, and post visit time on the date of service.        Subjective:   ZOX:WRUE Bishop Limbo, MD  Chief complaint:  51 y.o. male with a PMH of CAD s/p STEMI (09/09/2018) and multiple PCIs (RCA 09/2018, LAD), HFrEF (most recent EF 25%), DM2, right toe amputation, and newly diagnosed colon adenocarcinoma, who presents for follow-up for his heart failure and coronary artery disease.    History of Present Illness:  Patient was seen by Dr. Andrey Farmer in 03/2019, on GDMT for HF including metoprolol, aldactone 25 and lisinopril 5. He was lost to follow up with our cardiology team here at [facility] during the first years of the COVID pandemic. Patient was last seen in clinic by me on 08/15/21 and reported shortness of breath when walking 1/16 of a mile, showering, or getting dressed. Also reported some chest discomfort that he described as a stabbing pain with exertion intermittently, that is relieved with rest. Patient appeared to fall asleep several times during the visit.     He was admitted in [place] on 10/29/21 for abdominal pain, N/V and was found to have colon mass but left AMA prior to intervention. Patient received 1 unit RBC during this hospital stay. Patient was admitted again from 11/21-11/29/22 for abdominal pain, and newly diagnosed right-sided colon lesion.  On 11/22, pt underwent echo for preoperative planning which showed LVEF of 20-25%. He was discharged with plans to return for planned colon resection, however presented prior to scheduled date with worsening small bowel obstruction, and underwent surgical work up. He underwent right hemicolectomy on 11/20/21. His course was complicated by ileus postoperatively in the setting of preop SBO. He was discharged on 12/20.    Interval History:  Patient not feeling well today. Endorses chest pressure at rest, does not worsen with exertion. Dyspnea on exertion that seems stable. He lost 10 lbs in past month. Appetite is okay, denies early satiety and abdominal bloating. Denies lightheadedness, dizziness, and syncope. Notes struggling with not having the stability of a home, and feels he could better cope with his health and better care for himself if he had that stability. He is homeless, and still living with a friend. He is struggling with finances-- he has been out of his medications for some time due to finances.     Daily tobacco use-- a few cigarettes daily. Denies other substance use.    Past Medical History  Patient Active Problem List   Diagnosis   ??? History of ST elevation myocardial infarction (STEMI)   ??? DM2 (diabetes mellitus, type 2) (CMS-HCC)   ??? Chest pain   ??? Polysubstance abuse (CMS-HCC)   ??? HFrEF (heart failure with reduced ejection fraction) (CMS-HCC)   ??? Left ventricular aneurysm   ??? Tobacco abuse   ??? Coronary artery disease involving native coronary artery of native heart without angina pectoris   ??? Colonic mass   ??? Hypertension   ??? Severe protein-calorie malnutrition (CMS-HCC)   ??? Colon adenocarcinoma (CMS-HCC)       Medications:  Current Outpatient Medications   Medication Sig Dispense Refill   ??? carvediloL (COREG) 3.125 MG tablet Take 1 tablet (3.125 mg total) by mouth in the morning and 1 tablet (3.125 mg total) in the evening. Take with meals. 60 tablet 0   ??? ferrous sulfate 325 (65 FE) MG tablet Take 1 tablet (325 mg total) by mouth in the morning. 30 tablet 11   ??? methocarbamoL (ROBAXIN) 500 MG tablet Take 1 tablet (500 mg total) by mouth four (4) times a day as needed (muscle spasms, abdominal pain). 20 tablet 0   ??? multivitamins, therapeutic with minerals 9 mg iron-400 mcg tablet Take 1 tablet by mouth daily. 130 tablet 0   ??? spironolactone (ALDACTONE) 25 MG tablet Take 1 tablet (25 mg total) by mouth daily. 90 tablet 3   ??? ascorbic acid, vitamin C, (VITAMIN C) 250 MG tablet Take 250 mg by mouth. (Patient not taking: Reported on 01/09/2022)     ??? aspirin (ECOTRIN) 81 MG tablet Take 1 tablet (81 mg total) by mouth daily. 90 tablet 3   ??? [START ON 01/27/2022] capecitabine (XELODA) 150 MG tablet Take 2 tablets (300 mg total) by mouth Two (2) times a day  with 1 other capecitabine prescription for 1,800 mg total. Take Days 1 through 14, then 1 week off. 56 tablet 5   ??? [START ON 01/27/2022] capecitabine (XELODA) 500 MG tablet Take 3 tablets (1,500 mg total) by mouth Two (2) times a day  with 1 other capecitabine prescription for 1,800 mg total. Take Days 1 through 14, then 1 week off. 84 tablet 5   ??? clotrimazole (LOTRIMIN) 1 % cream Apply 1 application topically Two (2) times a day. (Patient not taking: Reported on 12/18/2021)     ??? dapagliflozin (FARXIGA)  5 mg Tab tablet Take 5 mg by mouth daily. (Patient not taking: Reported on 12/18/2021)     ??? DULoxetine (CYMBALTA) 30 MG capsule Take 30 mg by mouth daily. (Patient not taking: Reported on 12/18/2021)     ??? fluticasone propionate (FLONASE) 50 mcg/actuation nasal spray into each nostril. (Patient not taking: Reported on 12/18/2021)     ??? ibuprofen (MOTRIN) 400 MG tablet Take 1 tablet (400 mg total) by mouth every six (6) hours. (Patient not taking: Reported on 01/09/2022) 30 tablet 0   ??? [START ON 01/27/2022] loperamide (IMODIUM) 2 mg capsule If having diarrhea, take 2 capsules (4 mg) by mouth after the first loose stool, then 1 capsule every 2 hours until diarrhea free for 12 hours.?? 60 capsule 11   ??? losartan (COZAAR) 25 MG tablet Take 1/2 tablet (12.5 mg total) by mouth daily. 45 tablet 3   ??? nitroglycerin (NITROSTAT) 0.4 MG SL tablet Place 1 tablet (0.4 mg total) under the tongue every five (5) minutes as needed for chest pain. Max 3 tablets. If no relief, call 911 or go to ER. (Patient not taking: Reported on 12/18/2021) 30 each 3   ??? [START ON 01/27/2022] ondansetron (ZOFRAN) 8 MG tablet Take 1 tablet (8 mg total) by mouth every eight (8) hours as needed for nausea. 30 tablet 2   ??? [START ON 01/27/2022] prochlorperazine (COMPAZINE) 10 MG tablet Take 1 tablet (10 mg total) by mouth every six (6) hours as needed for nausea. 30 tablet 2     No current facility-administered medications for this visit.       Allergies  Allergies   Allergen Reactions   ??? Penicillins Other (See Comments)     Patient unsure of allergy       Social History:   Social History     Tobacco Use   ??? Smoking status: Former     Packs/day: 1.00     Types: Cigarettes     Quit date: 11/22/2018     Years since quitting: 3.1   ??? Smokeless tobacco: Current   Substance Use Topics   ??? Alcohol use: No   ??? Drug use: Not Currently     Types: Marijuana, Oxycodone, Hydrocodone     Comment: pt tested positive for cocaine use in past. Current use is unclear at this time.       Family History:  Family History   Problem Relation Age of Onset   ??? Heart disease Mother    ??? Cancer Mother    ??? Heart disease Father        ROS- 12 system review is negative other than what is specified in the History of Present Illness.      Objective:   Physical Exam  Vitals:    01/09/22 1115   BP: 119/74   Pulse: 87   Resp: 20   Temp: 37 ??C (98.6 ??F)   SpO2: 95%   Weight: 85.7 kg (188 lb 14.4 oz)   Height: 175.3 cm (5' 9)     Wt Readings from Last 3 Encounters:   01/14/22 88.2 kg (194 lb 6.4 oz)   01/09/22 85.7 kg (188 lb 14.4 oz)   12/31/21 90.3 kg (199 lb)     General-  Patient is chronically ill-appearing in no acute distress  Neurologic- Drowsy but oriented X3.  Cranial nerve II-XII grossly intact. No focal deficits.  HEENT-  Normocephalic atraumatic head.  Scleral anicteric, conjunctiva clear.  Wearing mask  Neck- Supple, jugular  venous pulsation not seen with HOB at 45 degrees.  Lungs- clear to auscultation, no wheezes, rhonchi, or rhales. No increased work of breathing  Heart-  RRR, S1 and S2, no MRG  Abdomen- Soft, nontender, no organomegally. Non-distended.   Extremities-  No clubbing or cyanosis.  No edema to lower extremities bilaterally. Warm and well perfused  Psych- Normal mood, appropriate. Drowsy- falling asleep at times during conversation      Laboratory data:      Component Value Date/Time    PROBNP 264.0 (H) 01/03/2019 1444       Lab Results   Component Value Date    WBC 7.7 12/18/2021    HGB 9.9 (L) 12/18/2021    HCT 32.7 (L) 12/18/2021    PLT 315 12/18/2021       Lab Results   Component Value Date    NA 137 01/09/2022    K 4.2 01/09/2022    CL 107 01/09/2022    CO2 24.5 01/09/2022    BUN <5 (L) 01/09/2022    CREATININE 0.99 01/09/2022    GLU 215 (H) 01/09/2022    CALCIUM 9.1 01/09/2022    MG 1.8 11/30/2021    PHOS 2.4 11/30/2021       Lab Results   Component Value Date    TSH 2.160 09/13/2018    ALBUMIN 3.4 12/18/2021    ALT <7 (L) 12/18/2021    AST 9 12/18/2021    INR 1.07 11/03/2021       No results found for: LDL, HDL    Electrocardiogram:  From 11/19/21 showed NSR, anterior infarct, nonspecific ST and T wave abnormality.     From 08/15/21 showed ST with t wave inversions in inferior leads, with prior inversion in previous EKGs    Echocardiogram:  From 11/04/21 showed mild LVH, LVEF 20-25%. There anterior and septal walls are thinned and akinetic. The apex is aneurysmal. There is eccentric mitral valve regurgitation, probably moderate. The right ventricle is normal in size, with normal systolic function. When compared to prior echocardiogram from 10/06/2018, the function and wall motion is overall similar.    TTE 07/2020 per report showed EF 25-30%, grade 2 diastolic dysfunction, mod MR, mild LA dilatation.     TTE 10/06/2018 showed ultrasound enhancing agent utilized to improve endocardial border definition. Dilated left ventricle - mild. Severely decreased left ventricular systolic function, ejection fraction 25%. Anterior, septal, and apical akinesis, with sparing of the basal anterior and septal walls. There is a small apical aneurysm. Diastolic dysfunction - grade I (normal filling pressures). Dilated left atrium - mild. Normal right ventricular systolic function. Dilated right atrium - mild.    TTE 09/14/2018 showed technically difficult study due to chest wall/lung interference. Ultrasound enhancing agent utilized to improve endocardial border definition. Severely decreased left ventricular systolic function, ejection fraction 25%. Segmental wall motion abnormality - (anteroseptal and apical). Diastolic dysfunction - grade II (elevated filling pressures). Normal right ventricular systolic function. Mildly elevated right atrial pressure.    Cardiac catheterization:  From 09/13/2018 showed patent stent in the mid LAD. Successful PCI to the chronic total occlusion of the right coronary. Normal left ventricular filling pressures (LVEDP = 16 mm Hg).  Left Main:  The left main coronary artery (LMCA) is a very large-caliber vessel that originates from the left coronary sinus. It bifurcates into the left anterior descending (LAD) and left circumflex (LCx) arteries. There is no angiographic evidence of significant disease in the LMCA.  LAD:  The LAD is a  large-caliber vessel that gives off 3 diagonal (D) branches before it wraps around the apex. There is a patent stent after the D1 takeoff.  The LAD is otherwise diffusely plaqued and the mid to distal LAD has a systolic size reduction which may reflex an intramyocardial course.  There is a 70% distal LAD stenosis.    Left Circumflex:  The LCx is a large-caliber vessel that gives off 3 obtuse marginal (OM) branches and then continues as a small vessel in the AV groove. Proximal to OM1 there is a 70% stenosis.  OM1 is a large-caliber vessel with a 60% ostial stenosis. Proximal to OM2 there is an 80% stenosis.  The ostium of OM2 has a 70% stenosis. The circ to OM3 is diffusely diseased.    Right Coronary:  The right coronary artery (RCA) is a large-caliber vessel originating from the right coronary sinus. There are sequential calcific subtotal occlusions in the mid RCA      Documentation assistance was provided by Halina Maidens, Scribe on December 22, 2021 at 12:04 PM for [name], AGACNP-BC.    I have reviewed the documentation provided by the scribe and confirm that it accurately reflects the service I personally performed and the decisions made by me.  Signature: Faustino Congress, AGACNP-BC  Date: 01/09/2022  Time: 6:29 PM    Kenston Longton L. Weyman Rodney  Cardiology Nurse Practitioner   [facility] Division of Cardiology  Pager [phone]

## 2022-01-07 DIAGNOSIS — C189 Malignant neoplasm of colon, unspecified: Principal | ICD-10-CM

## 2022-01-09 ENCOUNTER — Ambulatory Visit: Admit: 2022-01-09 | Discharge: 2022-01-10 | Payer: MEDICAID

## 2022-01-09 MED ORDER — ASPIRIN 81 MG TABLET,DELAYED RELEASE
ORAL_TABLET | Freq: Every day | ORAL | 3 refills | 90 days | Status: CP
Start: 2022-01-09 — End: 2023-01-09
  Filled 2022-01-09: qty 90, 90d supply, fill #0

## 2022-01-09 MED ORDER — LOSARTAN 25 MG TABLET
ORAL_TABLET | Freq: Every day | ORAL | 3 refills | 90 days | Status: CP
Start: 2022-01-09 — End: 2022-04-09
  Filled 2022-01-09: qty 45, 90d supply, fill #0

## 2022-01-09 NOTE — Unmapped (Signed)
Pt is here today.  He states he is tired and just trying to make it.  He has been out of meds anywhere from a few days to weeks.  He says he does not have the copay.  He complains of a knot behind his right nipple.  Sometimes it hurts if he bumps against it.  His main complaint is bilateral leg pain.  He also says his legs are swollen and his socks are tight.

## 2022-01-09 NOTE — Unmapped (Addendum)
Go get blood work today.     Make appt for phone visit next week to check how you are doing.     Re-start losartan 12.5 mg daily (half a pill), and aspirin 81 mg daily. I sent these prescriptions to the outpatient pharmacy here at [facility], I will send them to [facility] if you need me too.    If you have questions following your visit, our clinic contact information for [facility] at [facility] is as follows:    Scheduling: [phone]  Nurse line: [phone]  Fax: [phone]

## 2022-01-14 ENCOUNTER — Ambulatory Visit: Admit: 2022-01-14 | Discharge: 2022-01-15 | Payer: MEDICAID

## 2022-01-14 DIAGNOSIS — Z79899 Other long term (current) drug therapy: Principal | ICD-10-CM

## 2022-01-14 DIAGNOSIS — D509 Iron deficiency anemia, unspecified: Principal | ICD-10-CM

## 2022-01-14 DIAGNOSIS — C189 Malignant neoplasm of colon, unspecified: Principal | ICD-10-CM

## 2022-01-19 DIAGNOSIS — C189 Malignant neoplasm of colon, unspecified: Principal | ICD-10-CM

## 2022-01-20 NOTE — Unmapped (Signed)
[facility] Specialty Medication Onboarding    Specialty Medication: Capecitabine 150mg  & 500mg   Prior Authorization: Not Required   Financial Assistance: No - copay  <$25  Final Copay/Day Supply: $4 each / 21    Insurance Restrictions: None     Notes to Pharmacist: Please deliver to [facility] outpatient pharmacy as patient lacks other good delivery location.  Contact brian crandell [phone] with questions.    The triage team has completed the benefits investigation and has determined that the patient is able to fill this medication at [facility]. Please contact the patient to complete the onboarding or follow up with the prescribing physician as needed.

## 2022-01-22 NOTE — Unmapped (Addendum)
[employer] Pre-Treatment Assessment (PTA):     Per chart, pt's primary needs are being addressed by clinical Nurse Navigator and assigned Child psychotherapist. Following initiation of treatment, Oncology Patient Navigator (OPN) will complete an Active Treatment Assessment (ATA) and provide appropriate non-medical resourcing/support. If sooner assistance is needed, please send in basket to OPN.

## 2022-01-22 NOTE — Unmapped (Signed)
[facility]   Patient Onboarding/Medication Counseling    Jaime Griffith is a 51 y.o. male with Colon adenocarcinoma who I am counseling today on initiation of therapy.  I am speaking to the patient.    Was a Nurse, learning disability used for this call? No    Verified patient's date of birth / HIPAA.    Specialty medication(s) to be sent: Hematology/Oncology: Capecitabine 150mg , directions: Take 3 of the 500 mg tablets + 2 of the 150 mg tablets twice daily for 14 days on and 7 days off.  1,800 mg total dose and Capecitabine 500mg , directions: Take 3 of the 500 mg tablets + 2 of the 150 mg tablets twice daily for 14 days on and 7 days off.  1,800 mg total dose     Non-specialty medications/supplies to be sent: none    Medications not needed at this time: none     Xeloda (capecitabine)    Medication & Administration     Dosage: Take 3 of the 500 mg tablets + 2 of the 150 mg tablets twice daily for 14 days on and 7 days off.  1,800 mg total dose     Administration: I reviewed the importance of taking with a full glass of water within 30 minutes of a meal (at least 1 cup of food). Discussed that tablet should be swallowed whole and cannot be crushed or chewed.    Adherence/Missed dose instruction: If a dose is missed, do not take an extra dose or two doses at one time. Simply take your next dose at the regularly scheduled time and record any missed doses so our team is aware.    Goals of Therapy     Prevent disease progression    Side Effects & Monitoring Parameters     ??? Nausea/vomiting  ??? Diarrhea/constipation  ??? Infection precautions  ??? Fatigue  ??? Mouth sores or irritation  ??? Hand/foot syndrome (tingling, numbness, pain, redness, peeling or blistering) Use Urea 20% cream, Aquaphor, Eucerin, Cetaphil, or CeraVe twice daily on hands and feet as preventative  ??? Sun precautions  ??? Decrease appetite/ taste changes  ??? Bleeding precautions (bruising easily, nose bleeds, gums bleed)  ??? Headache  ??? Dry skin  ??? Hair loss    The following side effects should be reported to the provider:  ??? Diarrhea not controlled by anti-diarrheals  ??? Swelling, warmth, numbness, change of color or pain in a leg or arm  ??? Signs of infection (fever >100.4, chills, sore throat, sputum production)  ??? Signs of liver problems (dark urine, abdominal pain, light-colored stools, vomiting, yellow skin or eyes)  ??? Signs of bleeding (vomiting or coughing up blood, blood that looks like coffee grounds, blood in the urine or black, red tarry stools, bruising that gets bigger without reason, any persistent or severe bleeding)  ??? Skin rash or signs of skin infection (cracking, peeling, blistering, bleeding skin, red or irritated eyes)  ??? Signs of fluid and electrolyte problems (mood changes, confusion, abnormal/fast  heartbeat, severe dizziness, passing out, increased thirst, seizures, loss of strength and energy, lack of appetite, unable to pass urine or change in amount of urine passed, dry mouth, dry eyes, or nausea or vomiting.  ??? Signs of hand foot syndrome (redness or irritation on the palms of the hands or soles of the feet)  ??? Signs of mucositis (red or swollen mouth/gums, sores in the mouth, gums or tongue, soreness or pain in the mouth or throat, difficulty swallowing or  talking, dryness, mild burning, or pain when eating food white patches or pus in the mouth or on the tongue, increased mucus or thicker saliva)  ??? Signs of anaphylaxis (wheezing, chest tightness, swelling of face, lips, tongue or throat)    Monitoring parameters: Renal function should be estimated at baseline to determine initial dose. During therapy, CBC with differential, hepatic function, and renal function should be monitored. Monitor INR closely if receiving concomitant warfarin. Pregnancy test prior to treatment initiation (in females of reproductive potential). Monitor for diarrhea, dehydration, hand-foot syndrome, Stevens-Johnson syndrome, toxic epidermal necrolysis, stomatitis, and cardiotoxicity. Monitor adherence.    Contraindications, Warnings & Precautions     [place] Boxed Warning: Capecitabine may increase the anticoagulant effects of warfarin; bleeding events, including death, have occurred with concomitant use. Clinically significant increases in prothrombin time (PT) and INR have occurred within several days to months after capecitabine initiation (in patients previously stabilized on anticoagulants), and may continue up to 1 month after capecitabine discontinuation. May occur in patients with or without liver metastases. Monitor PT and INR frequently and adjust anticoagulation dosing accordingly. An increased risk of coagulopathy is correlated with a cancer diagnosis and age >60 years.    Contraindications:  ??? Known hypersensitivity to capecitabine, fluorouracil, or any component of the formulation;   ??? Severe renal impairment (CrCl <30 mL/minute)    Warnings and Precautions:  ??? Bone marrow suppression  ??? Cardiotoxicity: Myocardial infarction, ischemia, angina, dysrhythmias, cardiac arrest, cardiac failure, sudden death, ECG changes, and cardiomyopathy  ??? Mouth sores-discussed use of baking soda/salt water rinses  ??? Dermatologic toxicity: Stevens-Johnson syndrome and toxic epidermal necrolysis   ??? Hand/foot prevention (moisturizers, luke warm hand washes, patting hands/feet dry, decrease rubbing on hands/feet)  ??? GI toxicity: May cause diarrhea (may be severe); Importance of good nutrition to help minimize diarrhea (high protein, BRAT, yogurt, avoid greasy and spicy foods).  Importance of hydration if having frequent diarrhea  ??? Reproductive concerns: Evaluate pregnancy status prior to therapy in females of reproductive potential. Females of reproductive potential should use effective contraception during treatment and for 6 months after the last dose. Males with male partners of reproductive potential should use effective contraception during treatment and for 3 months after the last dose. Based on the mechanism of action and data from animal reproduction studies, in utero exposure to capecitabine may cause fetal harm.   ??? Breast feeding considerations: It is not known if capecitabine is present in breast milk. Due to the potential for serious adverse reactions in the breastfed infant, breastfeeding is not recommended by the manufacturer during treatment and for 2 weeks after the last dose.    Drug/Food Interactions     ??? Medication list reviewed in Epic. The patient was instructed to inform the care team before taking any new medications or supplements. Ondansetron may enhance the QTc-prolonging effect of Fluorouracil Products like capecitabine - This interaction is likely of greater significance with IV ondansetron, which has a higher risk for prolonging the QT interval.- Monitor therapy.   ??? Avoid live vaccines.    Storage, Handling Precautions, & Disposal     ??? This medication should be stored at room temperature and in a dry location. Keep out of reach of others including children and pets. Keep the medicine in the original container with a child-proof top (no pillboxes). Do not throw away or flush unused medication down the toilet or sink. This drug is considered hazardous and should be handled as little as possible.  If someone  else helps with medication administration, they should wear gloves.      Current Medications (including OTC/herbals), Comorbidities and Allergies     Current Outpatient Medications   Medication Sig Dispense Refill   ??? ascorbic acid, vitamin C, (VITAMIN C) 250 MG tablet Take 250 mg by mouth. (Patient not taking: Reported on 01/09/2022)     ??? aspirin (ECOTRIN) 81 MG tablet Take 1 tablet (81 mg total) by mouth daily. 90 tablet 3   ??? [START ON 01/27/2022] capecitabine (XELODA) 150 MG tablet Take 2 tablets (300 mg total) by mouth Two (2) times a day  with 1 other capecitabine prescription for 1,800 mg total. Take Days 1 through 14, then 1 week off. 56 tablet 5   ??? [START ON 01/27/2022] capecitabine (XELODA) 500 MG tablet Take 3 tablets (1,500 mg total) by mouth Two (2) times a day  with 1 other capecitabine prescription for 1,800 mg total. Take Days 1 through 14, then 1 week off. 84 tablet 5   ??? carvediloL (COREG) 3.125 MG tablet Take 1 tablet (3.125 mg total) by mouth in the morning and 1 tablet (3.125 mg total) in the evening. Take with meals. 60 tablet 0   ??? clotrimazole (LOTRIMIN) 1 % cream Apply 1 application topically Two (2) times a day. (Patient not taking: Reported on 12/18/2021)     ??? dapagliflozin (FARXIGA) 5 mg Tab tablet Take 5 mg by mouth daily. (Patient not taking: Reported on 12/18/2021)     ??? DULoxetine (CYMBALTA) 30 MG capsule Take 30 mg by mouth daily. (Patient not taking: Reported on 12/18/2021)     ??? ferrous sulfate 325 (65 FE) MG tablet Take 1 tablet (325 mg total) by mouth in the morning. 30 tablet 11   ??? fluticasone propionate (FLONASE) 50 mcg/actuation nasal spray into each nostril. (Patient not taking: Reported on 12/18/2021)     ??? ibuprofen (MOTRIN) 400 MG tablet Take 1 tablet (400 mg total) by mouth every six (6) hours. (Patient not taking: Reported on 01/09/2022) 30 tablet 0   ??? [START ON 01/27/2022] loperamide (IMODIUM) 2 mg capsule If having diarrhea, take 2 capsules (4 mg) by mouth after the first loose stool, then 1 capsule every 2 hours until diarrhea free for 12 hours.?? 60 capsule 11   ??? losartan (COZAAR) 25 MG tablet Take 1/2 tablet (12.5 mg total) by mouth daily. 45 tablet 3   ??? methocarbamoL (ROBAXIN) 500 MG tablet Take 1 tablet (500 mg total) by mouth four (4) times a day as needed (muscle spasms, abdominal pain). 20 tablet 0   ??? multivitamins, therapeutic with minerals 9 mg iron-400 mcg tablet Take 1 tablet by mouth daily. 130 tablet 0   ??? nitroglycerin (NITROSTAT) 0.4 MG SL tablet Place 1 tablet (0.4 mg total) under the tongue every five (5) minutes as needed for chest pain. Max 3 tablets. If no relief, call 911 or go to ER. (Patient not taking: Reported on 12/18/2021) 30 each 3   ??? [START ON 01/27/2022] ondansetron (ZOFRAN) 8 MG tablet Take 1 tablet (8 mg total) by mouth every eight (8) hours as needed for nausea. 30 tablet 2   ??? [START ON 01/27/2022] prochlorperazine (COMPAZINE) 10 MG tablet Take 1 tablet (10 mg total) by mouth every six (6) hours as needed for nausea. 30 tablet 2   ??? spironolactone (ALDACTONE) 25 MG tablet Take 1 tablet (25 mg total) by mouth daily. 90 tablet 3     No current facility-administered medications for this visit.  Allergies   Allergen Reactions   ??? Penicillins Other (See Comments)     Patient unsure of allergy       Patient Active Problem List   Diagnosis   ??? History of ST elevation myocardial infarction (STEMI)   ??? DM2 (diabetes mellitus, type 2) (CMS-HCC)   ??? Chest pain   ??? Polysubstance abuse (CMS-HCC)   ??? HFrEF (heart failure with reduced ejection fraction) (CMS-HCC)   ??? Left ventricular aneurysm   ??? Tobacco abuse   ??? Coronary artery disease involving native coronary artery of native heart without angina pectoris   ??? Colonic mass   ??? Hypertension   ??? Severe protein-calorie malnutrition (CMS-HCC)   ??? Colon adenocarcinoma (CMS-HCC)       Reviewed and up to date in Epic.    Appropriateness of Therapy     Acute infections noted within Epic:  No active infections  Patient reported infection: None    Is medication and dose appropriate based on diagnosis and infection status? Yes    Prescription has been clinically reviewed: Yes      Baseline Quality of Life Assessment      How many days over the past month did your Colon adenocarcinoma  keep you from your normal activities? For example, brushing your teeth or getting up in the morning. 0    Financial Information     Medication Assistance provided: None Required    Anticipated copay of $4 each strength ($8 total) / 21 days reviewed with patient. Verified delivery address.    Delivery Information     Scheduled delivery date: 02/02/22    Expected start date: 02/04/22    Medication will be delivered via [facility] to the temporary address in [place].  This shipment will not require a signature.      Explained the services we provide at [facility] and that each month we would call to set up refills.  Stressed importance of returning phone calls so that we could ensure they receive their medications in time each month.  Informed patient that we should be setting up refills 7-10 days prior to when they will run out of medication.  A pharmacist will reach out to perform a clinical assessment periodically.  Informed patient that a welcome packet, containing information about our pharmacy and other support services, a Notice of Privacy Practices, and a drug information handout will be sent.      The patient or caregiver noted above participated in the development of this care plan and knows that they can request review of or adjustments to the care plan at any time.      Patient or caregiver verbalized understanding of the above information as well as how to contact the pharmacy at [phone] option 4 with any questions/concerns.  The pharmacy is open Monday through Friday 8:30am-4:30pm.  A pharmacist is available 24/7 via pager to answer any clinical questions they may have.    Patient Specific Needs     - Does the patient have any physical, cognitive, or cultural barriers? no good permanent home address    - Does the patient have adequate living arrangements? (i.e. the ability to store and take their medication appropriately) No - No good permanent home address.  Scheduling deliveries to [facility]    - Did you identify any home environmental safety or security hazards? unsure    - Patient prefers to have medications discussed with  Patient     - Is the patient or caregiver able to read and understand education materials at a high school level or above? Yes    - Patient's primary language is  English     - Is the patient high risk? Yes, patient is taking oral chemotherapy. Appropriateness of therapy as been assessed    SOCIAL DETERMINANTS OF HEALTH     At the [facility], we have learned that life circumstances - like trouble affording food, housing, utilities, or transportation can affect the health of many of our patients.   That is why we wanted to ask: are you currently experiencing any life circumstances that are negatively impacting your health and/or quality of life? Patient declined to answer    Social Determinants of Health     Food Insecurity: Food Insecurity Present   ??? Worried About Programme researcher, broadcasting/film/video in the Last Year: Sometimes true   ??? Ran Out of Food in the Last Year: Sometimes true   Tobacco Use: High Risk   ??? Smoking Tobacco Use: Former   ??? Smokeless Tobacco Use: Current   ??? Passive Exposure: Not on file   Transportation Needs: Unmet Transportation Needs   ??? Lack of Transportation (Medical): Yes   ??? Lack of Transportation (Non-Medical): Yes   Alcohol Use: Not on file   Housing/Utilities: High Risk   ??? Within the past 12 months, have you ever stayed: outside, in a car, in a tent, in an overnight shelter, or temporarily in someone else's home (i.e. couch-surfing)?: Yes   ??? Are you worried about losing your housing?: Yes   ??? Within the past 12 months, have you been unable to get utilities (heat, electricity) when it was really needed?: Yes   Substance Use: Not on file   Financial Resource Strain: High Risk   ??? Difficulty of Paying Living Expenses: Very hard   Physical Activity: Not on file   Health Literacy: Medium Risk   ??? : Rarely   Stress: Stress Concern Present   ??? Feeling of Stress : Rather much   Intimate Partner Violence: Not on file   Depression: Not on file   Social Connections: Not on file       Would you be willing to receive help with any of the needs that you have identified today? Not applicable       Sivan Cuello [facility] Specialty Pharmacist

## 2022-01-23 NOTE — Unmapped (Signed)
Outpatient Oncology Social Work      SW covering primary LCSW Annice Pih.    SW called pt to talk with him about transportation resources for his upcoming appointments. He has Medicaid transportation through [facility] and will reach out to them for transportation.     SW provided active listening and support as patient talked about how difficult it is to manage his medical needs and appointments without income. He has applied for Social Security Disability but the application is still pending. He receives FoodStamps. He is living with friends but does not have a permanent place to live.     SW messaged MAC worker Darrell Jewel Fields to find out if she can assist with finding out the status of patient's Mining engineer.     SW made [employer] referral for pt to receive at next appointment.     Shea Stakes, CCM  Oncology Outpatient Social Worker  [phone]

## 2022-01-27 MED ORDER — PROCHLORPERAZINE MALEATE 10 MG TABLET
ORAL_TABLET | Freq: Four times a day (QID) | ORAL | 2 refills | 8 days | Status: CP | PRN
Start: 2022-01-27 — End: ?
  Filled 2022-01-30: qty 30, 8d supply, fill #0

## 2022-01-27 MED ORDER — LOPERAMIDE 2 MG CAPSULE
ORAL_CAPSULE | 11 refills | 0 days | Status: CP
Start: 2022-01-27 — End: ?
  Filled 2022-01-30: qty 60, 4d supply, fill #0

## 2022-01-27 MED ORDER — CAPECITABINE 500 MG TABLET
ORAL_TABLET | Freq: Two times a day (BID) | ORAL | 5 refills | 14 days | Status: CP
Start: 2022-01-27 — End: ?

## 2022-01-27 MED ORDER — ONDANSETRON HCL 8 MG TABLET
ORAL_TABLET | Freq: Three times a day (TID) | ORAL | 2 refills | 10 days | Status: CP | PRN
Start: 2022-01-27 — End: ?
  Filled 2022-01-30: qty 30, 10d supply, fill #0

## 2022-01-27 MED ORDER — CAPECITABINE 150 MG TABLET
ORAL_TABLET | Freq: Two times a day (BID) | ORAL | 5 refills | 14 days | Status: CP
Start: 2022-01-27 — End: ?
  Filled 2022-02-02: qty 56, 21d supply, fill #0

## 2022-01-28 NOTE — Unmapped (Signed)
[facility] GI MEDICAL ONCOLOGY CLINIC NOTE    Encounter Date: 02/04/2022    PCP:   Jaime Huguenin, MD    Consulting Providers:  Dr. Inetta Griffith [facility] GI Surgery  ____________________________________________________________________  Oncology History:   Diagnosis:  colon adenocarcinoma  Current Goal of Therapy: surveillance  - 11/05/21 colonoscopy revealing a partially obstructing mass at ascending colon biopsy showing moderately/poorly differentiated adenocarcinoma with possible loss of MSH2 raising concern for Lynch Syndrome. Mass could not be traversed and cecum was not reached.   - 11/16/21 transferred to [facility] from OSH for management of a large bowel obstruction in proximal ascending colon  - 11/20/21 laparoscopic converted to open R hemicolectomy with path revealing a 5.5cm tumor moderately-differentiated adenocarcinoma at IC valve and a separate tumor at proximal ascending colon poorly-differentiated adenocarcinoma with focal mucinous features infiltrating into pericolonic adipose tissue, 7/43 LNs positive for carcinoma, negative margins, +PNI, +LVI. Staged as pT3N2b. There was again loss of MSH2. MSI-H. His post-op course was complicated by need for TPN and prolonged NGT to suction. He was weaned off both prior to discharge.     - 02/04/22 Cycle 1 CAPOX -  3 of the 500 mg tablets + 2 of the 150 mg tablets twice daily for 14 days on and 7 days off = 1,800 mg total dose   - 02/04/22 C1 Extravasation of oxalipaltin     Molecular: MSI-H. Loss of MSH2 on IHC.     ____________________________________________________________________  Assessment and Plan:  Jaime Griffith is a 51 y.o. male who presented for evaluation and recommendations regarding colon adenocarcinoma.     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 He underwent a R hemicolectomy revealing pT3N2b disease again with loss of MSH2.     Dr. Rozell Griffith previously over data for adjuvant therapy for colon cancer showing a survival benefit of doublet chemotherapy in stage III resected colon cancer. No adjuvant IO is standard of care yet and the adjuvant atezo trial has closed accrual and Dr. Rozell Griffith didn't think he was a great trial candidate. He did not feel comfortable with FOLFOX given need for port and lack of support to help get pump on and off, however, we discussed CAPOX as a reasonable option to try.     We discussed schedule, side effects, and I emphasized the importance of him being compliant with therapy and coming to appointments as scheduled; he has been complaint so far and collectively we think he deserves the chance to try therapy. Low health literacy with poor understanding of instructions of how to take his medications, places him at a higher risk, verbalized understanding.     We went over side effects including fatigue, N/V, HFS, mucositis, coronary vasospasm (which is particularly relevant given his HFrEF), diarrhea, myelosuppression, dysgeustia, peripheral neuropathy, cold induced neuropathy, organ dysfunction, or rarely death.     We will plan for at least 3 months of therapy with the goal of six per IDEA    - 02/04/22 Start CAPOX x 3-6 months for adjuvant treatment   - Our pharmacist will meet with him in infusion and provided calender for oral treatment and further education. Will be closely following up to monitor treatment tolerance.     - Surveillance:   - H&P + CEA: every 3-6 months x 2 years, then every 6 months x 3 years   - Scans: annually, due 12/23   -  Colonoscopy: due 11/2022    Microcytic Anemia: suspect due to IDA from chronic blood loss.  - Hgb 11.7, MCV 77.1, MCH 23.5  Iron panel added:   - Iron 26 / TIBC 405 / Iron sat 6% / B12 453 / Ferritin 12.5  - Iron deficit roughly 770 mg  - Will discuss replacement at follow up visit.     HFrEF 2/2 ICM: most recent echo 11/04/21 with EF 20-25%. Well compensated on exam today.  - continue GDMT  - Followed up 2/20     CAD: s/p multiple PCIs. Follows with Jaime Griffith.  - continue ASA and high-dose statin     Loss of MSH2 c/f Lynch Syndrome: patient has declined genetics referral for full screening but will continue to discuss with future visits as he does have biologic children.   - genetics referral placed, scheduled 03/09/22    Hepatitis B S Ab reactive:   Hep B surface Ag non-reactive / HepB S Ab Reactive / Hep B Surf Ab Quant >1,000 / Hep B Core Total Ab Reactive   - Indicates early recovery and immunity. Infectivity unknown.  - repeat antibody testing in 3 months *not added at this time to treatment plan    Polysubstance Abuse:   - Smokes daily. Notes he used to smoke marijuana & cocaine but denies use today, despite being drowsy during exam, dozing off several times.   -  Due to concern for cardiac complications associated w/ cocaine + / - capecitabine, Utox added onto next cycle.     Extravasation of upper extremity: secondary to oxalipaltin during C1    Psychosocial Limitations:   - Social work following   - Hydrologist but the application is still pending.   - Receives FoodStamps.   - No permanent residence  - Medicaid transportation through Jaime Griffith  - Received groceries today through [employer]    Follow-Up:  RTC 3 weeks + chemo + lab + Jaime Griffith  RTC 6 weeks + chemo + lab + Jaime Griffith    _____________________________________________________________________  Interval History:  Jaime Griffith is a 51 y.o. male who presented for a return visit regarding colon adenocarcinoma.     Jaime Griffith presents for D1C1 Capecitabine/Oxaliplatin. He is very drowsy and was actively dozing off during exam. He expressed he didn't have a comfortable place to sleep last night, so he was up all night to ensure he didn't miss today's appointment. After retierating he had to be engaged during exam, for him to receive chemo, he was then engaged throughout the duration of the exam. He denied smoking marijuana or partaking in any other elicit drug use prior to clinic visit. Verbalized understanding the risks of treatment in conjunction with illicit drug use, specifically capecitabine.   The drowsiness isn't new, prior clinic notes also made mention that falls asleep easily.     He is aware he will need to take 3 of the 500 mg tablets + 2 of the 150 mg tablets twice daily for 14 days on and 7 days off.     Other then the above concerns, he voiced he hopes that he can get through treatment without any complications and reiterated he wants to do everything to reduce his cancer risk; he understands the risks/benefits and need to be consistent with chemotherapy in addition to all of his other medications.     Past Medical History:  Ischemic HFrEF (EF 25-30%)  CAD c/b STEMI s/p PCI to LAD, RCA 2019  T2DM  Polysubstance abuse including tobacco, cocaine, MJ    Social History:  Patient is homeless, but is currently living in [place], [place] with his brother. Smokes daily. Notes he used to smoke marijuana, cocaine but no current use (notably upon asking again he has used cocaine very recently). No current job.     Review of Systems:  A ten-point review of systems was conducted and negative unless stated in HPI.    Physical Exam:  VITALS: BP 103/77  - Pulse 76  - Temp 36.6 ??C (97.9 ??F) (Oral)  - Resp 17  - Wt 86.7 kg (191 lb 3.2 oz)  - SpO2 100%  - BMI 28.22 kg/m??     CONSTITUTIONAL: in NAD, appears stated age, able to get on exam table unassisted, seems to doze off during interview, eyes closed often, speech somewhat slurred at times, follows commands, oriented x 4  HEENT: MMM, no oral lesions or exudates, neck supple  CARDIO: normal rate, regular rhythm, no murmurs, no JVD  RESPIRATORY: CTAB b/l, normal WOB on RA, no conversational dyspnea, reported exertional dyspnea  GI: soft, mild TTP throughout abdomen, ND, no appreciable HSM, well-healed midline abdominal scar  EXTREMITIES: no LE edema  SKIN: no rashes or ecchymoses, no mass noted behind R nipple  NEUROLOGIC: alert and oriented, no gross focal deficits noted   LYMPH: no supraclavicular, axillary, or cervical LAD  PSYCH: appropriate mood and affect, good insight and judgment     Labs:  Reviewed In Epic, ok for treatment  Hgb 11.7  WBC 8.3  ANC 4.5  PLTS 261  NA 142  K 4.2  M 2.0  AST 10  ALT <7  AP 89  Al 3.9    Imaging:  I personally reviewed the following imaging studies and my interpretation is: No new images to review for this encounter

## 2022-01-30 ENCOUNTER — Ambulatory Visit
Admit: 2022-01-30 | Discharge: 2022-01-31 | Payer: MEDICAID | Attending: Student in an Organized Health Care Education/Training Program | Primary: Student in an Organized Health Care Education/Training Program

## 2022-01-30 DIAGNOSIS — Z1159 Encounter for screening for other viral diseases: Principal | ICD-10-CM

## 2022-01-30 DIAGNOSIS — D500 Iron deficiency anemia secondary to blood loss (chronic): Principal | ICD-10-CM

## 2022-01-30 DIAGNOSIS — Z794 Long term (current) use of insulin: Principal | ICD-10-CM

## 2022-01-30 DIAGNOSIS — E1142 Type 2 diabetes mellitus with diabetic polyneuropathy: Principal | ICD-10-CM

## 2022-01-30 DIAGNOSIS — F32A Depression, unspecified depression type: Principal | ICD-10-CM

## 2022-01-30 DIAGNOSIS — C189 Malignant neoplasm of colon, unspecified: Principal | ICD-10-CM

## 2022-01-30 DIAGNOSIS — N486 Induration penis plastica: Principal | ICD-10-CM

## 2022-01-30 DIAGNOSIS — I5020 Unspecified systolic (congestive) heart failure: Principal | ICD-10-CM

## 2022-01-30 LAB — LIPID PANEL
CHOLESTEROL/HDL RATIO SCREEN: 2.3 (ref 1.0–4.5)
CHOLESTEROL: 122 mg/dL (ref <=200)
HDL CHOLESTEROL: 54 mg/dL (ref 40–60)
LDL CHOLESTEROL CALCULATED: 45 mg/dL (ref 40–99)
NON-HDL CHOLESTEROL: 68 mg/dL — ABNORMAL LOW (ref 70–130)
TRIGLYCERIDES: 117 mg/dL (ref 0–150)
VLDL CHOLESTEROL CAL: 23.4 mg/dL (ref 12–47)

## 2022-01-30 LAB — RETICULOCYTES
RETICULOCYTE ABSOLUTE COUNT: 63.5 10*9/L (ref 23.0–100.0)
RETICULOCYTE COUNT PCT: 1.35 % (ref 0.50–2.17)

## 2022-01-30 LAB — CEA: CARCINOEMBRYONIC ANTIGEN: 2.3 ng/mL (ref 0.0–5.0)

## 2022-01-30 LAB — VITAMIN B12: VITAMIN B-12: 453 pg/mL (ref 211–911)

## 2022-01-30 LAB — ALBUMIN / CREATININE URINE RATIO
ALBUMIN QUANT URINE: 1.4 mg/dL
ALBUMIN/CREATININE RATIO: 17.7 ug/mg (ref 0.0–30.0)
CREATININE, URINE: 79.1 mg/dL

## 2022-01-30 LAB — IRON PANEL
IRON SATURATION: 6 % — ABNORMAL LOW (ref 20–55)
IRON: 26 ug/dL — ABNORMAL LOW
TOTAL IRON BINDING CAPACITY: 405 ug/dL (ref 250–425)

## 2022-01-30 LAB — FERRITIN: FERRITIN: 12.5 ng/mL

## 2022-01-30 MED ORDER — ATORVASTATIN 80 MG TABLET
ORAL_TABLET | Freq: Every day | ORAL | 3 refills | 90 days | Status: CP
Start: 2022-01-30 — End: 2023-01-30
  Filled 2022-01-30: qty 90, 90d supply, fill #0

## 2022-01-30 MED ORDER — DULOXETINE 30 MG CAPSULE,DELAYED RELEASE
ORAL_CAPSULE | Freq: Every day | ORAL | 3 refills | 90 days | Status: CP
Start: 2022-01-30 — End: 2023-01-30
  Filled 2022-01-30: qty 90, 90d supply, fill #0

## 2022-01-30 MED ORDER — CLOTRIMAZOLE 1 % TOPICAL CREAM
Freq: Two times a day (BID) | TOPICAL | 0 refills | 28 days | Status: CP
Start: 2022-01-30 — End: ?
  Filled 2022-01-30: qty 28, 15d supply, fill #0

## 2022-01-30 MED ORDER — GABAPENTIN 100 MG CAPSULE
ORAL_CAPSULE | Freq: Three times a day (TID) | ORAL | 3 refills | 90 days | Status: CP
Start: 2022-01-30 — End: 2023-01-30
  Filled 2022-01-30: qty 270, 90d supply, fill #0

## 2022-01-30 MED ORDER — MULTIVITAMIN TABLET
ORAL_TABLET | Freq: Every day | ORAL | 2 refills | 120 days | Status: CP
Start: 2022-01-30 — End: 2023-01-30

## 2022-01-30 MED ORDER — DAPAGLIFLOZIN 10 MG TABLET
ORAL_TABLET | Freq: Every day | ORAL | 3 refills | 90 days | Status: CP
Start: 2022-01-30 — End: 2023-01-30
  Filled 2022-02-04: qty 90, 90d supply, fill #0

## 2022-01-30 NOTE — Unmapped (Signed)
Blood draw/Labs are not STAT sent down in normal bag. Per PCP ordered by another Dr no need for STAT. Pt declined Covid vaccine

## 2022-01-30 NOTE — Unmapped (Signed)
[facility]  [address]  [place], [address]  Phone: [phone]  Toll Free: [phone]  Fax: [phone]    Thank you for choosing [facility] for your care.    Important Numbers    Main Clinic: [phone] or toll free [phone]    After Hours, Weekend, or Holidays:  Call the [facility] 24/7 Nursing Line [phone] to get nurse advice.  Go to [facility] ([facility]) walk-in clinic at [address], [address], [place], [place]; [phone]; 7 days a week from 9:00AM - 8:00PM.  Go to [facility] at [facility] at [address], [place], [place] [address]; [phone]; 7 days a week from 8:00PM - 8:00PM  Go to [facility] at [facility] at [address], [place], [place]; [phone]; Mon-Fri 8:00AM-7:00PM, Sat-Sun 12:00PM-5:00PM  Go to [facility] at [facility] at [address] [place], [place] [address], [phone], 7 days a week from 8:00AM - 8:00PM  Go to [facility]gent Care for sprains and strains, joint pain, sports injuries and possible fractures at [address], [place], [place]; [phone]; Mon-Thurs 8:00AM-7:00PM, Fri 8:00AM-5:00PM  [facility] website with full listings:  [email or web address]       [facility] at [facility] Counselor: [phone]  6th Floor/Internal Medicine Clinic: Ivor Costa [phone]  Ardean Larsen (bilingual): [phone]    [facility]([place] PAP): [phone], choose option 2  [facility]: [phone] *Pharmacy can mail medications to your home. You must call to request the medication be mailed.[facility]: [phone]  [facility]: [phone]    [facility] Care Management: Are you having trouble with you health because of cost, your mood, trouble getting to clinic, or where you live. Our Care Management team can help!  Available to assist with requests sent via mychart or by calling the main clinic number at [phone]  Monday-Friday 8:00AM - 5:00PM    [facility]  I'm feeling unsafe, have experienced physical abuse, threats, emotional abuse, sexual abuse or other violence. Who do I call for confidential advice and assistance?  Call [phone] Monday through Fridays 9:00am-4:30pm. Call [phone] after hours.    Same Day Clinic   I'm sick today and need an appointment during office hours. Who do I call?  Call [phone], ask for an appointment in the Same Day Clinic  Same Day Clinic is located on the 5th floor at [address], [place] [address]    How do I request medication refills?  Request a refill via [facility] (patient portal), call clinic at [phone] or have your pharmacy fax the request to [phone].    We highly encourage those with internet access to sign up for My [facility] Chart, our new patient portal service.  This service is free to all [facility] patients and offers the following benefits:  Secure messaging with your care team  Request appointments/cancel appointments  Access test results  Request prescription refills  Pay bills online  Manage the health of loved ones  Track your health    Sign up for your My [facility] Chart account at [email or web address].  Free Android and iOs smartphone and tablet applications are also available for your convenience.    How to reset you [facility] Chart  If you forget your My [facility] Chart username or password, select ???forgot username??? or ???forgot password??? located under the ???sign in??? button on the login page. If you do not remember the information required to reset your password, you can contact [facility] at [phone] to have your My [facility] Chart password reset.    Scheduling appointments Online  The [facility] MyChart secure website and app makes it easy for you to schedule appointments. You can schedule most primary care clinic appointments online with providers you have seen before. Log in to [email or web address] and select Visits to schedule an appointment today

## 2022-01-30 NOTE — Unmapped (Signed)
[facility] at [facility]     Type of visit: face to face    Are you located in [place]? (for virtual visits only)     Reason for visit: Establish Care    Questions / Concerns that need to be addressed: Pt would like to discuss previous GI operation, Diabetes, feet and legs feel heavy, numbness and in pain, fatigue, medication review     Screening BP- 125/80  16    Diabetes:  ??? Regularly checking blood sugars?: no  o If yes, when? Complete log for past 7 days  Date Before Breakfast After Breakfast Before Lunch After Lunch Before Dinner After Dinner Before Bed                                                                                                                                   HCDM reviewed and updated in Epic:    We are working to make sure all of our patients??? wishes are updated in Epic and part of that is documenting a Environmental health practitioner for each patient  A Health Care Decision Maker is someone you choose who can make health care decisions for you if you are not able - who would you most want to do this for you????  is already up to date.    HCDM (patient stated preference): Murlean Iba - Sister - [phone]    HCDM (patient stated preference): [place],Brad - Brother [phone]    BPAs completed:  PHQ2  PHQ9  GAD7  AUDIT - Alcohol Screen    COVID-19 Vaccine Summary  Which COVID-19 Vaccine was administered  Type:  Dates Given:     Date last COVID Positive Test:   11/19/2021               If no: Are you interested in scheduling?   Immunization History   Administered Date(s) Administered   ??? Influenza Vaccine Quad (IIV4 PF) 83mo+ injectable 11/29/2018   ??? PNEUMOCOCCAL POLYSACCHARIDE 23 11/29/2018   ??? TdaP 02/14/2019       __________________________________________________________________________________________    SCREENINGS COMPLETED IN FLOWSHEETS    HARK Screening       AUDIT  AUDIT - C Score (Part 1): 1    PHQ2  PHQ-2 Total Score : 3    PHQ9  Thoughts that you would be better off dead, or of hurting yourself in some way: Several days  PHQ-9 TOTAL SCORE: 15    P4 Suicidality Screener  Initial Suicide Screening Question: active vs. passive thoughts  Have you had thoughts of actually hurting yourself?: No             GAD7    Over the last 2 weeks, how often have you been bothered by the following problems?  Feeling nervous, anxious or on edge: Several days  Not being able to stop or control worrying: More than half the days  Worrying  too much about different things: More than half the days  Trouble relaxing: More than half the days  Being so restless that it is hard to sit still: Not at all  Becoming easily annoyed or irritable: More than half the days  Feeling afraid as if something awful might happen: More than half the days  GAD-7 Total Score: 11    COPD Assessment       Falls Risk

## 2022-01-30 NOTE — Unmapped (Signed)
Internal Medicine Initial Visit        Assessment/Plan:     Jaime Griffith presents today to establish care.             1. Need for hepatitis C screening test    2. HFrEF (heart failure with reduced ejection fraction) (CMS-HCC)    3. Peyronie disease    4. Type 2 diabetes mellitus with diabetic polyneuropathy, with long-term current use of insulin (CMS-HCC)    5. Depression, unspecified depression type    6. 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 Colon adenocarcinoma (CMS-HCC)        DM II  A1c improved to 7% today, on farxiga 5 mg and intermittent use of insulin 20-25 U, none used in past 3 weeks. Will increase farxiga to 10 mg. Chem 3, urine albumin. Repeat A1c 3 months    HFrEF w/ ICD  Hx of ischemic cardiomyopathy, PCI in 2019. EF 20-25%. Following with cardiology. Recently stopped taking coreg/spiro due to confusion when losartan was restarted. Has follow-up with cardiology clinical pharmacist 2/20. Farxiga refilled today. Reassured him he should be taking all of his GDMT. Atorvastatin 80 mg refilled. Continue asa. Lipid panel ordered    Need for Hep C testing  Hep C    Hx of depression  PHQ 15, decreased mood, notes no hx of SI/HI. Previously on duloxetine for concomitant neuropathy. Will restart at 30 mg and consider uptitrating at next visit    Likely peyronie disease  Notes recently somewhat painful erections with curvature of penis. Firmness on midline of ventral penis. Will refer to urology    Symptomatic IDA  Likely in setting of colonic adenocarcinoma.Notes ongoing fatigue. On oral supplementation. Onc working on getting him IV iron. We can also assist at [facility] if needed  - iron panel, ferritin, vitamin b12, reticulocytes    Neuropathy   Likely secondary to DM II, with hx of R great toe amputation. Has several calluses on exam and is at risk of worsening. Have written script for diabetic shoes. No need for podiatry at the moment. Restart gapabentin 100 mg TID   - b12    Diabetic Foot Exam Clinic Note Documentation    Patient has diabetes mellitus: yes  As a physician, I interpret the patient's foot exam performed today or in the last 6 months to show the following: Partial or complete foot amputation, Calluses that could lead to foot ulcers, Nerve damage because of diabetes with signs of problems with calluses and Poor circulation    Staffed with Dr. Lennie Muckle, seen and discussed    Return in about 2 months (around 03/30/2022).        Chief Complaint:      Jaime Griffith is a 51 y.o. male who presents to Establish Care    Subjective:     HPI    From [place], [place]  Has few children in area, partner with him today who drove him  Establishing care with Doctors in this area given new cancer doctors     Was using insulin intermittently until last few weeks, roughly 20-25 U once daily  Has lost many of his medications    Stopped taking coreg/spiro after getting losartan, was confused on which he should take    Previously taking duloxetine for depression and neuropathy but has been out for some time    Notes some painful erections with curvature of his penis recently    The past medical history, surgical  history, family history, social history, medications and allergies were reviewed in Epic.     Review of Systems    The balance of 10 systems was reviewed and unremarkable except as stated above.        Objective:     BP 125/80 (BP Site: L Arm, BP Position: Sitting)  - Pulse 85  - Temp 36.9 ??C (98.4 ??F) (Oral)  - Resp 16  - Ht 175.3 cm (5' 9.02)  - Wt 88.3 kg (194 lb 9.6 oz)  - SpO2 97%  - BMI 28.72 kg/m??      General: well appearing, NAD, partner in room  HEENT: patent nares, no thyromegaly, no LAD  Eyes: anicteric sclera, EOMI, PERRL  Cardiovascular: RRR no m/r/g  Pulmonary: normal wob, CTAB without w/r/r  Gastrointestinal: Soft, NTND, midline well healed scar  Musculoskeletal: normal gait, normal muscle tone and bulk  Skin: dry/skin cracking on bilateral feet  LE: calluses on plantar aspect of greater toes on bilateral feet, decreased sensation to light touch along bilateral metatarsals    Records review:   Reviewed prior notes, labs, pfts    PHQ-9 Score:  PHQ-9 TOTAL SCORE: 15  GAD-7 Score:  GAD-7 Total Score: 11      Medication adherence and barriers to the treatment plan have been addressed. Opportunities to optimize healthy behaviors have been discussed. Patient / caregiver voiced understanding.

## 2022-02-01 LAB — HEPATITIS B SURFACE ANTIGEN: HEPATITIS B SURFACE ANTIGEN: NONREACTIVE

## 2022-02-01 LAB — HEPATITIS B SURFACE ANTIBODY
HEPATITIS B SURFACE ANTIBODY QUANT: >1000 m[IU]/mL — ABNORMAL HIGH (ref <8.00)
HEPATITIS B SURFACE ANTIBODY: REACTIVE — AB

## 2022-02-01 LAB — HEPATITIS B CORE ANTIBODY, TOTAL: HEPATITIS B CORE TOTAL ANTIBODY: REACTIVE — AB

## 2022-02-01 LAB — HEPATITIS C ANTIBODY: HEPATITIS C ANTIBODY: NONREACTIVE

## 2022-02-02 ENCOUNTER — Institutional Professional Consult (permissible substitution): Admit: 2022-02-02 | Discharge: 2022-02-03 | Payer: MEDICAID

## 2022-02-02 DIAGNOSIS — I5020 Unspecified systolic (congestive) heart failure: Principal | ICD-10-CM

## 2022-02-02 MED ORDER — ACCU-CHEK GUIDE TEST STRIPS
0 refills | 0 days | Status: CP
Start: 2022-02-02 — End: ?
  Filled 2022-02-02: qty 100, 90d supply, fill #0

## 2022-02-02 MED ORDER — LANCETS
1 refills | 0 days | Status: CP
Start: 2022-02-02 — End: ?
  Filled 2022-02-02: qty 100, 90d supply, fill #0

## 2022-02-02 MED ORDER — BLOOD-GLUCOSE METER
0 refills | 0 days | Status: CP
Start: 2022-02-02 — End: ?
  Filled 2022-02-02: qty 1, 30d supply, fill #0

## 2022-02-02 MED FILL — CAPECITABINE 500 MG TABLET: ORAL | 21 days supply | Qty: 84 | Fill #0

## 2022-02-02 NOTE — Unmapped (Signed)
[facility] CARDIOLOGY CLINIC  [address]  [place], [place] [address]  PHONE: [phone]  FAX: [phone]           CLINICAL PHARMACIST PRACTITIONER NOTE   HEART FAILURE    Referring Provider: Marti Sleigh, NP  Cardiologist: [facility]  Primary Care Provider: Silver Huguenin, MD    Assessment and Plan:     #HFrEF (LVEF 20-25%) Patient with presumed ischemic cardiomyopathy with reduced LV systolic function following STEMI.  Most recent LVEF from 11/04/21 showed EF 20-25% (down from 25-30% per report from August 2021). NHYA Class II SOB walking to clinic today. Primarily managed by [facility] Cardiology, referred to CPP given recent adherence concerns, need for medication reinitiation. On 01/09/22 visit, reported out of all medications for days to weeks with unclear recall. Previously on coreg 3.125 mg BID, lasix 20 mg as needed, losartan 25 mg daily, spironolactone 25 mg daily, and farxiga 5 mg daily. Now on losartan 25 mg, spironolactone 25 mg, carvedilol 3.125 once a day unable to get [place] as it needs a PA.   Low health literacy with poor understanding of instructions of how to take his meds.    ?? Start taking carvedilol 3.125 mg twice a day  ?? Pick up dapagliflozin (Farxiga) 10 mg at [facility] pharmacy  ?? Educated patient of how to determine if he needs to take furosemide.   ?? Continue losartan 25 mg daily and spironolactone 25 mg daily  ?? May consider transitioning sacubitril/valsartan Sherryll Burger) and/or titrating carvedilol and furosemide\ at a future visit as appropriate  ?? Marcelline Deist PA approved 01/29/23  Reviewed education regarding fluid retentions symptoms, importance of weight monitoring and need to log weight, BP and HR.   Further discussed therapies available for HF (BB, ACEI/ARB/ARNI, spiro, SGLT2).  Educated patient about foods high in Potassium.  Limit liquids to 1.5-2 L per day  ?? Monitor and log weights, blood pressure, and heart rate  ?? Instructed to purchase a scale and start checking daily  ?? Avoid NSAIDs  ?? Last TTE: 11/04/21     #ASCVD (CAD, hx STEMI s/p PCI mLAD, RCA 2019) On daily aspirin, high intensity statin for history of STEMI, CAD. Current LDL is at goal < 55 per 2022 [facility] Expert Consensus Decision Pathway given ASCVD at very high risk for recurrence.    ?? Key risk factors include: CAD, MI, Hypertension and Diabetes (hemoglobin A1c 9.6%)   ?? Last lipid panel 01/30/22, LDL calculated 45, triglycerides 117  ?? Continue aspirin 81 mg daily and atorvastatin 80 mg daily     # DM: Slightly above goal with most recent A1c 7.0% His Farxiga dose has been increased however he has been unable to pick up since it requires a PA.  Working on [place] and patient advised to pick up Rx.  In the past 3 months has been taking Farxiga 5 mg with in consistent use of insulin - none used in the past 3 months.  ?? Pick up dapagliflozin (Farxiga) 10 mg and start (previously was taking 5 mg daily)  ?? Provided patient with a meter to use as needed.      #Tobacco use, polysubstance use Per patient, currently smoking daily, down to couple cigarettes a day. History of marijuana, cocaine use. Previously not interested in smoking cessation, endorses smoking as a coping mechanism. Unable to discuss at today's visit.  ?? Will discuss at next visit  ?? Not currently taking anything    #Medication Adherence & Access:   ???  Encouraged use of medication pill box and reviewed appropriate use and Reviewed the indication, dose, and frequency of each medication with patient   o Patient plans to pick up a pill box and bring all his meds to next visit   ??? Medications reviewed in EPIC medication station and updated today by the clinical pharmacist practitioner.  ??? Manufacturer's assistance N/A: Patient has basic Medicaid (call [employer] for PA's)    #FOLLOW UP:   ??? lab check (BMP) in about 1-2 weeks  ??? return to clinic in about 1-2 weeks      I spent a total of 45 minutes face to face with the patient delivering clinical care and providing education/counseling.           Rafael Bihari, PharmD, BCACP, CPP  Cardiology Clinical Pharmacist Practitioner  [facility]  [address]  [place], [place] [address]  Phone: [phone]  Fax: [phone]      History of Present Illness:   Jaime Griffith is a 51 y.o. year old male with a history of HFrEF, STEMI, CAD, HTN, T2DM, tobacco use, polysubstance use, colon adenocarcinoma who presents today for initiation of previously tolerated GDMT at the request of Marti Sleigh, NP.       Summary of Recent Visits and Key Medication Changes  ??? 01/09/22: out of all medications for days to weeks, restarted on losartan 12.5 mg daily  ??? 02/09/22 Increase dapagliflozin (Farxiga) 10 mg daily    His current heart failure regimen includes losartan 12.5 mg daily      Interval History  Patient presents to clinic with his girlfriend.  Reports he is currently taking spironolactone 25 mg daily, losartan 25 mg daily (prefers to take tables whole therefore has been taking full tablet), spironolactone 25 mg daily, carvedilol 3.125 mg in the morning.  He has no been taking furosemide, unclear when he needs to take it.  He was unable to pick up the Farxiga from the pharmacy last Friday after his PCP visit when it was increased from 5 mg to 10 mg.  He has been out of the SGLt2i prior to that visit.  Reports not being good at keeping track of his meds.  Currently gets them filled at [facility] pharmacy, although lives in [place], sometimes with his GF and sometimes stays with his brother.  He has no income at this time.  Does have active Medicaid.    Reports recently was Dx with colon cancer and had surgery to remove part of his intestines.  He will be started on capcitabine (was to have started or pick up the Rx).  It is unclear when he should be starting it. Seeing oncology on 2/22.    Not very clear regarding his symptoms.   He reports getting SOB when walking to clinic from lot.  Denies orthopnea, paroxysmal nocturnal dyspnea, no LE edema at this time. No palpitations, chest pain, dizziness, pre-syncope or syncope. He reports a good appetite, abd distension or early satiety. He reports laying flat at night without difficulty.  Reports he picked up weight recently. Not using furosemide since he is unsure when to use, he has been off [place] for a few weeks.        Blood pressure self-monitoring  He does not have a BP monitor      Weight self-monitoring  He does not have a scale and has not been weighing himself.      Cardiovascular History, Studies and Procedures:  Cath / PCI: Hx CAD, STEMI s/p multiple PCI (mLAD, RCA 2019)    CV Surgery:     EP Procedures and Devices:     Non-Invasive Evaluation(s):   Echo: Last 11/03/21, EF 20-25%    Cardiac CT/MRI/Nuclear Tests:     Cardiopulmonary Stress Tests:         Past Medical & Surgical History  Past Medical History:   Diagnosis Date   ??? Myocardial infarction (CMS-HCC)         Past Surgical History:   Procedure Laterality Date   ??? PR CATH PLACE/CORON ANGIO, IMG SUPER/INTERP,W LEFT HEART VENTRICULOGRAPHY N/A 09/13/2018    Procedure: CATH LEFT HEART CATHETERIZATION W INTERVENTION;  Surgeon: Marlaine Hind, MD;  Location: [facility] CATH;  Service: Cardiology   ??? PR COLONOSCOPY W/BIOPSY SINGLE/MULTIPLE N/A 11/05/2021    Procedure: COLONOSCOPY, FLEXIBLE, PROXIMAL TO SPLENIC FLEXURE; WITH BIOPSY, SINGLE OR MULTIPLE;  Surgeon: Maris Berger, MD;  [facility];  Service: Gastroenterology   ??? PR LAP,SURG,COLECTOMY,W/REMVL TERM ILEUM N/A 11/20/2021    Procedure: LAPAROSCOPY, SURGICAL; COLECTOMY, PARTIAL, WITH REMOVAL OF TERMINAL ILEUM WITH ILEOCOLOSTOMY;  Surgeon: Estelle June, MD;  Location: MAIN OR [facility];  Service: Gastrointestinal   ??? STENTS [facility] HISTORICAL RESULT)          Social & Family History  He  reports that he quit smoking about 3 years ago. His smoking use included cigarettes. He smoked an average of 1 pack per day. He uses smokeless tobacco. He reports that he does not currently use drugs after having used the following drugs: Marijuana, Oxycodone, and Hydrocodone. He reports that he does not drink alcohol.     Family History   Problem Relation Age of Onset   ??? Heart disease Mother    ??? Cancer Mother    ??? Heart disease Father         Medications:  Reviewed and updated. Medication list includes revisions made during today's encounter.    Current Outpatient Medications   Medication Sig Dispense Refill   ??? ascorbic acid, vitamin C, (VITAMIN C) 250 MG tablet Take 250 mg by mouth.     ??? aspirin (ECOTRIN) 81 MG tablet Take 1 tablet (81 mg total) by mouth daily. 90 tablet 3   ??? atorvastatin (LIPITOR) 80 MG tablet Take 1 tablet (80 mg total) by mouth daily. 90 tablet 3   ??? blood sugar diagnostic (ACCU-CHEK GUIDE TEST STRIPS) Strp Use as directed to check blood sugars once daily 100 each 0   ??? blood-glucose meter Misc Use as directed to check blood sugars 1 each 0   ??? capecitabine (XELODA) 150 MG tablet Take 2 tablets (300 mg total) by mouth Two (2) times a day  with 1 other capecitabine prescription for 1,800 mg total. Take Days 1 through 14, then 1 week off. (Patient not taking: Reported on 01/30/2022) 56 tablet 5   ??? capecitabine (XELODA) 500 MG tablet Take 3 tablets (1,500 mg total) by mouth Two (2) times a day  with 1 other capecitabine prescription for 1,800 mg total. Take Days 1 through 14, then 1 week off. (Patient not taking: Reported on 01/30/2022) 84 tablet 5   ??? carvediloL (COREG) 3.125 MG tablet Take 1 tablet (3.125 mg total) by mouth in the morning and 1 tablet (3.125 mg total) in the evening. Take with meals. 60 tablet 0   ??? clotrimazole (LOTRIMIN) 1 % cream Apply 1 application topically Two (2) times a day. 28 g 0   ???  dapagliflozin (FARXIGA) 10 mg Tab tablet Take 1 tablet (10 mg total) by mouth daily. 90 tablet 3   ??? DULoxetine (CYMBALTA) 30 MG capsule Take 1 capsule (30 mg total) by mouth daily. 90 capsule 3   ??? ferrous sulfate 325 (65 FE) MG tablet Take 1 tablet (325 mg total) by mouth in the morning. 90 tablet 3   ??? fluticasone propionate (FLONASE) 50 mcg/actuation nasal spray into each nostril. (Patient not taking: Reported on 12/18/2021)     ??? gabapentin (NEURONTIN) 100 MG capsule Take 1 capsule (100 mg total) by mouth Three (3) times a day. 270 capsule 3   ??? lancets (ACCU-CHEK SOFTCLIX LANCETS) Misc Used as directed to check your sugars once daily 100 each 1   ??? loperamide (IMODIUM) 2 mg capsule If having diarrhea, take 2 capsules (4 mg) by mouth after the first loose stool, then 1 capsule every 2 hours until diarrhea free for 12 hours.?? 60 capsule 11   ??? losartan (COZAAR) 25 MG tablet Take 1/2 tablet (12.5 mg total) by mouth daily. 45 tablet 3   ??? multivitamin (TAB-A-VITE/THERAGRAN) per tablet Take 1 tablet by mouth daily. 120 tablet 2   ??? nitroglycerin (NITROSTAT) 0.4 MG SL tablet Place 1 tablet (0.4 mg total) under the tongue every five (5) minutes as needed for chest pain. Renda Pohlman 3 tablets. If no relief, call 911 or go to ER. (Patient not taking: Reported on 12/18/2021) 30 each 3   ??? ondansetron (ZOFRAN) 8 MG tablet Take 1 tablet (8 mg total) by mouth every eight (8) hours as needed for nausea. 30 tablet 2   ??? prochlorperazine (COMPAZINE) 10 MG tablet Take 1 tablet (10 mg total) by mouth every six (6) hours as needed for nausea. 30 tablet 2   ??? spironolactone (ALDACTONE) 25 MG tablet Take 1 tablet (25 mg total) by mouth daily. 90 tablet 3     No current facility-administered medications for this visit.       Allergies/Adverse Events:  Allergies   Allergen Reactions   ??? Penicillins Other (See Comments)     Patient unsure of allergy          Lab Results  Lab Results   Component Value Date    PRO-BNP 264.0 (H) 01/03/2019    PRO-BNP 408.0 (H) 11/16/2018    PRO-BNP 1,070.0 (H) 10/13/2018    Creatinine 0.99 01/09/2022    Creatinine 0.90 12/18/2021    BUN <5 (L) 01/09/2022    BUN 8 (L) 12/18/2021    Potassium 4.2 01/09/2022    Potassium 4.1 12/18/2021    Magnesium 1.8 11/30/2021 Magnesium 1.9 11/29/2021    AST 9 12/18/2021    AST 12 11/25/2021    ALT <7 (L) 12/18/2021    ALT <7 (L) 11/25/2021    TSH 2.160 09/13/2018    Total Bilirubin 0.6 12/18/2021    Total Bilirubin 0.2 (L) 11/25/2021    INR 1.07 11/03/2021    INR 0.98 10/06/2018    WBC 7.7 12/18/2021    HGB 9.9 (L) 12/18/2021    HCT 32.7 (L) 12/18/2021    Platelet 315 12/18/2021    Cholesterol 122 01/30/2022    Triglycerides 117 01/30/2022    HDL 54 01/30/2022    Non-HDL Cholesterol 68 (L) 01/30/2022    LDL Calculated 45 01/30/2022    HGB A1C, POC 7.0 (H) 01/30/2022         Vital Signs:  Vitals:    02/02/22 0854   BP: 118/83  Pulse: 91   Temp: 36.3 ??C (97.3 ??F)   SpO2: 99%       BP Readings from Last 3 Encounters:   02/02/22 118/83   01/30/22 125/80   01/14/22 106/73     Pulse Readings from Last 3 Encounters:   02/02/22 91   01/30/22 85   01/14/22 85     Wt Readings from Last 3 Encounters:   02/02/22 87.5 kg (193 lb)   01/30/22 88.3 kg (194 lb 9.6 oz)   01/14/22 88.2 kg (194 lb 6.4 oz)      BMI Readings from Last 3 Encounters:   02/02/22 28.49 kg/m??   01/30/22 28.72 kg/m??   01/14/22 28.71 kg/m??          Review of Systems:  12 system review is negative except as noted above    Physical Exam:  General: patient is scattered with his thought  Abdomen: distended however patient denies tenderness  Extremities: 1+ at ankles

## 2022-02-02 NOTE — Unmapped (Addendum)
It was good seeing you today.    As discussed:  Take all these medications  Losartan 25 mg 1 tablet  Carvedilol 3.125 mg 1 tablet twice a day  Dapagliflozin (Farxiga) 10 mg 1 tablet once a day  Getting a PA for you, please pick up at [facility]  Spironolactone 25 mg daily  Furosemide  20 mg as needed if weight is > 2 lbs in 1 day or 5 lbs in 1 week   Weight yourself daily and decide   Bring all your medications with you when you come in next   Continue other medications (aspirin 81 mg, atorvastatin 80 mg and others for your mood and pain  Get a scale and check your weight every morning.    Purchase a pill box that is for multiple times a day  Avoid NSAIDs (such as ibuprofen or naproxen)  Next visit   Please let us know if you have any issues        Call the clinic at [phone] with questions.    Our clinic fax number is [phone].    If you need to reschedule future appointments, please call [phone] or [phone]    After office hours, if you have urgent questions/problems, contact the on-call cardiologist through the hospital operator: [phone].

## 2022-02-04 ENCOUNTER — Ambulatory Visit: Admit: 2022-02-04 | Discharge: 2022-02-05 | Payer: MEDICAID

## 2022-02-04 ENCOUNTER — Institutional Professional Consult (permissible substitution): Admit: 2022-02-04 | Discharge: 2022-02-05 | Payer: MEDICAID

## 2022-02-04 DIAGNOSIS — C189 Malignant neoplasm of colon, unspecified: Principal | ICD-10-CM

## 2022-02-04 LAB — CBC W/ AUTO DIFF
BASOPHILS ABSOLUTE COUNT: 0.1 10*9/L (ref 0.0–0.1)
BASOPHILS RELATIVE PERCENT: 0.6 %
EOSINOPHILS ABSOLUTE COUNT: 0.4 10*9/L (ref 0.0–0.5)
EOSINOPHILS RELATIVE PERCENT: 4.9 %
HEMATOCRIT: 38.4 % — ABNORMAL LOW (ref 39.0–48.0)
HEMOGLOBIN: 11.7 g/dL — ABNORMAL LOW (ref 12.9–16.5)
LYMPHOCYTES ABSOLUTE COUNT: 2.7 10*9/L (ref 1.1–3.6)
LYMPHOCYTES RELATIVE PERCENT: 32.9 %
MEAN CORPUSCULAR HEMOGLOBIN CONC: 30.4 g/dL — ABNORMAL LOW (ref 32.0–36.0)
MEAN CORPUSCULAR HEMOGLOBIN: 23.5 pg — ABNORMAL LOW (ref 25.9–32.4)
MEAN CORPUSCULAR VOLUME: 77.1 fL — ABNORMAL LOW (ref 77.6–95.7)
MEAN PLATELET VOLUME: 7.9 fL (ref 6.8–10.7)
MONOCYTES ABSOLUTE COUNT: 0.6 10*9/L (ref 0.3–0.8)
MONOCYTES RELATIVE PERCENT: 7.1 %
NEUTROPHILS ABSOLUTE COUNT: 4.5 10*9/L (ref 1.8–7.8)
NEUTROPHILS RELATIVE PERCENT: 54.5 %
NUCLEATED RED BLOOD CELLS: 0 /100{WBCs} (ref <=4)
PLATELET COUNT: 261 10*9/L (ref 150–450)
RED BLOOD CELL COUNT: 4.98 10*12/L (ref 4.26–5.60)
RED CELL DISTRIBUTION WIDTH: 21.8 % — ABNORMAL HIGH (ref 12.2–15.2)
WBC ADJUSTED: 8.3 10*9/L (ref 3.6–11.2)

## 2022-02-04 LAB — COMPREHENSIVE METABOLIC PANEL
ALBUMIN: 3.9 g/dL (ref 3.4–5.0)
ALKALINE PHOSPHATASE: 89 U/L (ref 46–116)
ALT (SGPT): <7 U/L — ABNORMAL LOW (ref 10–49)
ANION GAP: 5 mmol/L (ref 5–14)
AST (SGOT): 10 U/L (ref <=34)
BILIRUBIN TOTAL: 0.5 mg/dL (ref 0.3–1.2)
BLOOD UREA NITROGEN: 14 mg/dL (ref 9–23)
BUN / CREAT RATIO: 14
CALCIUM: 9.4 mg/dL (ref 8.7–10.4)
CHLORIDE: 109 mmol/L — ABNORMAL HIGH (ref 98–107)
CO2: 28.5 mmol/L (ref 20.0–31.0)
CREATININE: 1.02 mg/dL
EGFR CKD-EPI (2021) MALE: 90 mL/min/{1.73_m2} (ref >=60)
GLUCOSE RANDOM: 128 mg/dL (ref 70–179)
POTASSIUM: 4.2 mmol/L (ref 3.4–4.8)
PROTEIN TOTAL: 7.2 g/dL (ref 5.7–8.2)
SODIUM: 142 mmol/L (ref 135–145)

## 2022-02-04 LAB — MAGNESIUM: MAGNESIUM: 2 mg/dL (ref 1.6–2.6)

## 2022-02-04 LAB — SLIDE REVIEW

## 2022-02-04 MED ADMIN — acetaminophen (TYLENOL) tablet 650 mg: 650 mg | ORAL | @ 20:00:00 | Stop: 2022-02-04

## 2022-02-04 MED ADMIN — ondansetron (ZOFRAN) tablet 24 mg: 24 mg | ORAL | @ 17:00:00 | Stop: 2022-02-04

## 2022-02-04 MED ADMIN — dexAMETHasone (DECADRON) tablet 8 mg: 8 mg | ORAL | @ 17:00:00 | Stop: 2022-02-04

## 2022-02-04 MED ADMIN — oxaliplatin (ELOXATIN) 270.4 mg in dextrose 5 % 250 mL IVPB: 130 mg/m2 | INTRAVENOUS | @ 18:00:00 | Stop: 2022-02-04

## 2022-02-04 MED ADMIN — dextrose 5 % infusion: 100 mL/h | INTRAVENOUS | @ 18:00:00 | Stop: 2022-02-04

## 2022-02-04 NOTE — Unmapped (Signed)
Infusion Progress Note    Patient Name: Jaime Griffith  Patient Age: 51 y.o.  Encounter Date: 02/04/2022  Allergies   Allergen Reactions   ??? Penicillins Other (See Comments)     Patient unsure of allergy       Reason for visit  Infusion Center visit   Today is cycle 1/ day 1 of therapy.   Chief Complaint: right arm IV infiltration with Oxaliplatin     I spent at least 15 minutes with this patient: assessing, performing physical exam with > 50% of time spent in counseling.     Assessment/Plan:  Extravasation-  Date of infusion: 02/04/22  Chemotherapy: Oxaliplatin  Amount of drug extravasated:  unknown  Site affected: left AC site  Type of chemo: choose one: irritant      Reference: Prevention and Treatment of Extravasation / Infiltration of Caustic Agents  NURS 0360 [place] Nursing Manual.     Recommendations:(choose what applies:)  - Patient advised to use WARM compresses every 6 hours for 20 minutes x 24-48 hours.   Advise warm if oxaliplatin, etoposide, or vinblastine/vincristine/vinorelbine  - Avoid excessive cold/heat that could cause tissue damage.  - Patient instructed to seek immediate medical attention in ED if symptoms progress or worsen.     Patient states understanding, reluctantly      I have discussed the case (exam, laboratory results, and plan) with primary oncology team, NP Ophelia Charter and nursing staff     Interval History/HPI:   Jaime Griffith is a 51 yo with colon adenocarcinoma. He complains of pain or swelling in left arm. Patient was sleeping the majority of the infusion, he awoke and complained of pain at his PIV site. There is notable swelling and mild pitting edema. It is unclear when the pain started or how much was infiltrated. He denies ulceration and denies blistering.       Pt  has not applied compresses every 6 hours for 20 minutes x 24 hours. Patient has not elevated the limb with the site of injury.   Reports  no relief with above interventions.     Oncology History   Colon adenocarcinoma (CMS-HCC)   11/20/2021 -  Cancer Staged    Staging form: Colon and Rectum, AJCC 8th Edition  - Pathologic stage from 11/20/2021: Stage IIIC (pT3, pN2b, cM0) - Signed by Phil Dopp, MD on 12/29/2021       12/29/2021 Initial Diagnosis    Colon adenocarcinoma (CMS-HCC)     02/04/2022 -  Chemotherapy    OP GI CAPECITABINE/OXALIPLATIN (ADJUVANT)  capecitabine 850 mg/m2 PO BID on days 1-14, oxaliplatin 130 mg/m2 IV on day 1, every 21 days         Review of Systems:  A complete review of systems was obtained including: Constitutional, Eyes, ENT, Cardiovascular, Respiratory, GI, GU, Musculoskeletal, Skin, Neurological, Psychiatric, Endocrine, Heme/Lymphatic, and Allergic/Immunologic systems. It is negative or non-contributory to the patient???s management except for positives mentioned in HPI.     Physical Exam:  Vital Signs:  Wt Readings from Last 3 Encounters:   02/04/22 86.5 kg (190 lb 11.2 oz)   02/04/22 86.7 kg (191 lb 3.2 oz)   02/02/22 87.5 kg (193 lb)     Temp Readings from Last 3 Encounters:   02/04/22 36.4 ??C (97.5 ??F) (Temporal)   02/04/22 36.6 ??C (97.9 ??F) (Oral)   02/02/22 36.3 ??C (97.3 ??F) (Temporal)     BP Readings from Last 3 Encounters:   02/04/22 100/65   02/04/22 103/77  02/02/22 118/83     Pulse Readings from Last 3 Encounters:   02/04/22 78   02/04/22 76   02/02/22 91       General:  Lethargic, somnolent, resting in no acute distress.  HEENT: Normocephalic and atraumatic. No scleral icterus or conjunctival injection.  Neck:  Trachea midline  Cardiovascular:  Regular rate, & rhythm.   Respiratory: Breathing is unlabored and patient is speaking full sentences with ease. No stridor.  Gastrointestinal:  Abdomen soft, nontender.   Musculoskeletal:  No bony pain. No grossly-evident joint effusions or deformities. Range of motion about the shoulder, elbow, hips and knees is grossly normal.   Extremities: Lower extremities are warm and without edema.  No clubbing, or cyanosis.  Skin and Subcutaneous Tissues: No rash, ecchymoses, purpuric lesions noted. Appropriately warm and dry  Psychiatric:  oriented to person, place, time and situation.       Results:  Recent Labs     02/04/22  0926   WBC 8.3   HGB 11.7*   HCT 38.4*   PLT 261   NEUTROABS 4.5   LYMPHSABS 2.7   MONOSABS 0.6   EOSABS 0.4   BASOSABS 0.1     Recent Labs     02/04/22  0926   NA 142   K 4.2   CL 109*   CO2 28.5   BUN 14   CREATININE 1.02   GLU 128   CALCIUM 9.4   MG 2.0   ALBUMIN 3.9   PROT 7.2   BILITOT 0.5   AST 10   ALT <7*   ALKPHOS 89     No results for input(s): COLORU, CLARITYU, SPECGRAV, PHUR, LEUKOCYTESUR, NITRITE, PROTEINUA, GLUCOSEU, KETONESU, UROBILINOGEN, WBCU, RBCU, SQUEPIU, BACTERIA in the last 72 hours.    Invalid input(s): BLODDU

## 2022-02-04 NOTE — Unmapped (Signed)
I was the supervising physician in the delivery of the service. Vernard Gambles, MD

## 2022-02-05 DIAGNOSIS — C189 Malignant neoplasm of colon, unspecified: Principal | ICD-10-CM

## 2022-02-05 NOTE — Unmapped (Addendum)
Called and spoke to patient to check in after C1D1 [place] yesterday. Patient reports that he is messed up and is referring to his left arm. Patient had an oxaliplatin extravasation. He was instructed to use warm compresses for 20 minutes every 4-6 hours for 24-48 hours and to seek medical attention in the ED for worsening symptoms. Patient reports that he has not used heat on the area. Does not have heating pad. We discussed using a warm washcloth. When asked, patient reports that arm is bigger than it was yesterday and armband is tight.    We discussed capecitabine. Patient did not take last night. He states that he plans to start today with timing of noon and midnight. He is aware that he is supposed to take pills with food.     We discussed that patient is advised to go to ED since he feels his left arm has worsened since yesterday. Patient asked if this NN could call him later as he has to go somewhere to do something. This NN is in agreement, however I repeated that it was necessary for him to start taking his pills.      1340: Returned call to patient. He states that his arm is sore. Seems unable to report whether it is more swollen than yesterday. Thinks it is more swollen but not as spread out. States that the medicine is making him sleepy. There were some delays in his answers. States that he took his medication (capecitabine). Asked if he took the anti-nausea medication as this could possibly make him sleepy but states that he did not.     Repeated to patient that we advise him to go to ED if his arm seems worse than yesterday. Explained that there some instances when patients have had tissue damage from oxaliplatin leaking. He states that is what it feels like. Asked patient if there was anyone I could call for him in order to assist him in seeking medical attention. Repeated this offer 2-3 times but patient could not share an individual. He states that there is someone with him now but that person cannot bring him to ED, states he would need to call an ambulance. Advised patient that we were concerned about his safety and would like for him to be evaluated. Asked if he would go to ED, he said maybe. Plan to update oncology team and follow up with patient in the morning.

## 2022-02-05 NOTE — Unmapped (Signed)
Patient arrived to infusion clinic at 11:05am from provider appointment for C1D1 oxaliplatin infusion. Patient arrived ambulatory but extremely lethargic. Patient had noted difficulty keeping his eyes open and responding to questions. Speech clear and not garbled. Verbal responses appropriate but somewhat delayed. VS WNL. Weight obtained. PIV placed in prior clinic appointment, RN assessed and verified brisk blood return and good flush, and dressing clean, dry, and intact. Labs obtained during prior clinic appointment and resulted within treatment parameters. RN reported lethargy to APP Rodney Booze who contacted treatment team. Treatment team confirmed that's his baseline and okay to treat. Drug released to pharmacy. Premedications (dexamethasone 8mg  PO and ondansetron 24mg  PO) administered as ordered at 1140. Oxaliplatin (270.4 mg) delivered from pharmacy. PIV patency confirmed prior to administration with brisk blood return, good flush noted. Prior to beginning chemotherapy, patient was noted to be sleeping deeply and was difficult to rouse for safety check. Patient was able to state name and birth date after multiple attempts to rouse him. Oxaliplatin administered as ordered beginning at 1250. Patient appeared to tolerate infusion well with no noted difficulty. Patient continued to be difficult to rouse throughout infusion but denied any negative effects. At RN check at 1450, patient woke and stated that his arm hurt at IV site. Infusion stopped. Site assessed with noted swelling and tenderness to area. Good flush, but no blood return. Patient was unable to state when his arm began to hurt. Total of 293 ml had infused with 26.3 ml left in bag. APP Rodney Booze notified and completed assessment, then notified treatment team. Infusion discontinued and PIV removed, catheter tip intact. APP prescribed Tylenol 650 mg for discomfort and advised patient to continue with warm compresses every 6 h for 20 mins x 24-48 hrs. Patient and caregiver instructed to see medical attention at ED if symptoms worsen. RN provided education regarding avoiding cold items for 5 days post-infusion. Patient declined instructional pamphlet but caregiver verbalized understanding. Patient declined AVS. Patient was discharged home to self care accompanied by caregiver.

## 2022-02-05 NOTE — Unmapped (Signed)
Outpatient Oncology Social Work  Follow Up       Psychosocial & Case Management Update:  SW reached out to Pt to provide food resources. Pt receives food stamps and states that he appealed his SSI denial and is waiting to hear back. SW encouraged him to reach out to the local SSA office to request a status update. Pt is eligible for assistance through Cancer Patient Assistance Fund (CPAF) and an application for meal assistance was submitted to [employer]. Pt is requesting that the cards be left at the clinic as his current housing situation is not stable. SW reached out to Pt's NN, who confirmed that the cards can be mailed to the clinic for him to pick up during his next appt.    Follow-Up Plan:   Pt has this SW's contact information and will reach out for assistance as needed.      Annice Pih, LCSW  Oncology Outpatient Social Worker  [phone]

## 2022-02-06 NOTE — Unmapped (Addendum)
0900: Attempt x 1 to reach patient's mobile phone. Left message and contact information and requested a call back.    Reason for call: Check in with patient about taking Capecitabine and check in on patient's arm (oxaliplatin extravasation).    1237: Attempt to reach patient by phone. No answer.

## 2022-02-09 ENCOUNTER — Ambulatory Visit: Admit: 2022-02-09 | Payer: MEDICAID

## 2022-02-09 NOTE — Unmapped (Incomplete)
[facility] CARDIOLOGY CLINIC  [address]  [place], [place] [address]  PHONE: [phone]  FAX: [phone]           CLINICAL PHARMACIST PRACTITIONER NOTE   HEART FAILURE    Referring Provider: Marti Sleigh, NP  Cardiologist: [facility]  Primary Care Provider: Silver Huguenin, MD    Assessment and Plan:     Last visit:  - restarted coreg at 3.125 BID  - restarted Jardiance at higher dose- confirmed pt picked up at [facility]  - continue losartan 25, spiro 25    Since last visit, on 2/23, reported arm extravasation from chemo, encouraged to go to ED, did not go    SOB/DOE  Edema  Weights  BP/HR  Arm     Today's plan:  - confirm doses of all meds  - titrate coreg as able  - smoking cessation     #HFrEF (LVEF 20-25%) Patient with presumed ischemic cardiomyopathy with reduced LV systolic function following STEMI.  Most recent LVEF from 11/04/21 showed EF 20-25% (down from 25-30% per report from August 2021). NHYA Class II SOB walking to clinic today. Primarily managed by [facility], referred to CPP given recent adherence concerns, need for medication reinitiation. On 01/09/22 visit, reported out of all medications for days to weeks with unclear recall. Previously on coreg 3.125 mg BID, lasix 20 mg as needed, losartan 25 mg daily, spironolactone 25 mg daily, and farxiga 5 mg daily. Now on losartan 25 mg, spironolactone 25 mg, carvedilol 3.125 once a day unable to get [place] as it needs a PA.   Low health literacy with poor understanding of instructions of how to take his meds.    ?? Start taking carvedilol 3.125 mg twice a day  ?? Pick up dapagliflozin (Farxiga) 10 mg at [facility] pharmacy  ?? Educated patient of how to determine if he needs to take furosemide.   ?? Continue losartan 25 mg daily and spironolactone 25 mg daily  ?? May consider transitioning sacubitril/valsartan Sherryll Burger) and/or titrating carvedilol and furosemide at a future visit as appropriate  ?? Marcelline Deist PA approved 01/29/23  Reviewed education regarding fluid retentions symptoms, importance of weight monitoring and need to log weight, BP and HR.   Further discussed therapies available for HF (BB, ACEI/ARB/ARNI, spiro, SGLT2).  Educated patient about foods high in Potassium.  Limit liquids to 1.5-2 L per day  ?? Monitor and log weights, blood pressure, and heart rate  ?? Instructed to purchase a scale and start checking daily  ?? Avoid NSAIDs  ?? Last TTE: 11/04/21     #ASCVD (CAD, hx STEMI s/p PCI mLAD, RCA 2019) On daily aspirin, high intensity statin for history of STEMI, CAD. Current LDL is at goal < 55 per 2022 [facility] Expert Consensus Decision Pathway given ASCVD at very high risk for recurrence.    ?? Key risk factors include: CAD, MI, Hypertension and Diabetes (hemoglobin A1c 9.6%)   ?? Last lipid panel 01/30/22, LDL calculated 45, triglycerides 117  ?? Continue aspirin 81 mg daily and atorvastatin 80 mg daily     # DM: Slightly above goal with most recent A1c 7.0% His Farxiga dose has been increased however he has been unable to pick up since it requires a PA.  Working on [place] and patient advised to pick up Rx.  In the past 3 months has been taking Farxiga 5 mg with in consistent use of insulin - none used in the past 3 months.  ?? Pick up  dapagliflozin (Farxiga) 10 mg and start (previously was taking 5 mg daily)  ?? Provided patient with a meter to use as needed.      #Tobacco use, polysubstance use Per patient, currently smoking daily, down to couple cigarettes a day. History of marijuana, cocaine use. Previously not interested in smoking cessation, endorses smoking as a coping mechanism. Unable to discuss at today's visit.  ?? Will discuss at next visit  ?? Not currently taking anything    #Medication Adherence & Access:   ??? Encouraged use of medication pill box and reviewed appropriate use and Reviewed the indication, dose, and frequency of each medication with patient   o Patient plans to pick up a pill box and bring all his meds to next visit   ??? Medications reviewed in EPIC medication station and updated today by the clinical pharmacist practitioner.  ??? Manufacturer's assistance N/A: Patient has basic Medicaid (call [employer] for PA's)    #FOLLOW UP:   ??? lab check (BMP) in about 1-2 weeks  ??? return to clinic in about 1-2 weeks      I spent a total of 45 minutes face to face with the patient delivering clinical care and providing education/counseling.    Derrel Nip, PharmD  PGY2 Ambulatory Care Pharmacy Resident    Eldridge Abrahams, PharmD, [facility], CPP  Cardiology Clinical Pharmacist Practitioner  [facility]  [address]  [place], [place] [address]  Phone: [phone]  Fax: [phone]      History of Present Illness:   Jaime Griffith is a 51 y.o. year old male with a history of HFrEF, STEMI, CAD, HTN, T2DM, tobacco use, polysubstance use, colon adenocarcinoma who presents today for initiation of previously tolerated GDMT at the request of Marti Sleigh, NP.       Summary of Recent Visits and Key Medication Changes  ??? 01/09/22: out of all medications for days to weeks, restarted on losartan 12.5 mg daily  ??? 02/02/22: Increase dapagliflozin (Farxiga) to 10 mg daily    His current heart failure regimen includes losartan 25 mg daily, carvedilol 3.125 mg BID, spironolactone 25 mg daily and dapagliflozin (Farxiga) 10 mg daily      Interval History  Patient presents to clinic with his girlfriend.  Reports he is currently taking spironolactone 25 mg daily, losartan 25 mg daily (prefers to take tables whole therefore has been taking full tablet), spironolactone 25 mg daily, carvedilol 3.125 mg in the morning.  He has no been taking furosemide, unclear when he needs to take it.  He was unable to pick up the Farxiga from the pharmacy last Friday after his PCP visit when it was increased from 5 mg to 10 mg.  He has been out of the SGLt2i prior to that visit.  Reports not being good at keeping track of his meds.  Currently gets them filled at [facility] pharmacy, although lives in [place], sometimes with his GF and sometimes stays with his brother.  He has no income at this time.  Does have active Medicaid.    Reports recently was Dx with colon cancer and had surgery to remove part of his intestines.  He will be started on capcitabine (was to have started or pick up the Rx).  It is unclear when he should be starting it. Seeing oncology on 2/22.    Not very clear regarding his symptoms.   He reports getting SOB when walking to clinic from lot.  Denies orthopnea, paroxysmal nocturnal dyspnea,  no LE edema at this time. No palpitations, chest pain, dizziness, pre-syncope or syncope. He reports a good appetite, abd distension or early satiety. He reports laying flat at night without difficulty.  Reports he picked up weight recently. Not using furosemide since he is unsure when to use, he has been off [place] for a few weeks.        Blood pressure self-monitoring  He does not have a BP monitor      Weight self-monitoring  He does not have a scale and has not been weighing himself.      Cardiovascular History, Studies and Procedures:    Cath / PCI: Hx CAD, STEMI s/p multiple PCI (mLAD, RCA 2019)    CV Surgery:     EP Procedures and Devices:     Non-Invasive Evaluation(s):   Echo: Last 11/03/21, EF 20-25%    Cardiac CT/MRI/Nuclear Tests:     Cardiopulmonary Stress Tests:         Past Medical & Surgical History  Past Medical History:   Diagnosis Date   ??? Myocardial infarction (CMS-HCC)         Past Surgical History:   Procedure Laterality Date   ??? PR CATH PLACE/CORON ANGIO, IMG SUPER/INTERP,W LEFT HEART VENTRICULOGRAPHY N/A 09/13/2018    Procedure: CATH LEFT HEART CATHETERIZATION W INTERVENTION;  Surgeon: Marlaine Hind, MD;  Location: [facility] CATH;  Service: Cardiology   ??? PR COLONOSCOPY W/BIOPSY SINGLE/MULTIPLE N/A 11/05/2021    Procedure: COLONOSCOPY, FLEXIBLE, PROXIMAL TO SPLENIC FLEXURE; WITH BIOPSY, SINGLE OR MULTIPLE;  Surgeon: Maris Berger, MD;  Location: GI PROC[facility]URES [facility];  Service: Gastroenterology   ??? PR LAP,SURG,COLECTOMY,W/REMVL TERM ILEUM N/A 11/20/2021    Procedure: LAPAROSCOPY, SURGICAL; COLECTOMY, PARTIAL, WITH REMOVAL OF TERMINAL ILEUM WITH ILEOCOLOSTOMY;  Surgeon: Estelle June, MD;  Location: MAIN OR [facility];  Service: Gastrointestinal   ??? STENTS [facility] HISTORICAL RESULT)          Social & Family History  He  reports that he has been smoking cigarettes. He has been smoking an average of 1 pack per day. He uses smokeless tobacco. He reports that he does not currently use drugs after having used the following drugs: Marijuana, Oxycodone, and Hydrocodone. He reports that he does not drink alcohol.     Family History   Problem Relation Age of Onset   ??? Heart disease Mother    ??? Cancer Mother    ??? Heart disease Father         Medications:  Reviewed and updated. Medication list includes revisions made during today's encounter.    Current Outpatient Medications   Medication Sig Dispense Refill   ??? ascorbic acid, vitamin C, (VITAMIN C) 250 MG tablet Take 250 mg by mouth. (Patient not taking: Reported on 02/04/2022)     ??? aspirin (ECOTRIN) 81 MG tablet Take 1 tablet (81 mg total) by mouth daily. 90 tablet 3   ??? atorvastatin (LIPITOR) 80 MG tablet Take 1 tablet (80 mg total) by mouth daily. (Patient not taking: Reported on 02/04/2022) 90 tablet 3   ??? blood sugar diagnostic (ACCU-CHEK GUIDE TEST STRIPS) Strp Use as directed to check blood sugars once daily (Patient not taking: Reported on 02/04/2022) 100 each 0   ??? blood-glucose meter Misc Use as directed to check blood sugars (Patient not taking: Reported on 02/04/2022) 1 each 0   ??? capecitabine (XELODA) 150 MG tablet Take 2 tablets (300 mg total) by mouth Two (2) times a day  with 1  other capecitabine prescription for 1,800 mg total. Take Days 1 through 14, then 1 week off. (Patient not taking: Reported on 01/30/2022) 56 tablet 5   ??? capecitabine (XELODA) 500 MG tablet Take 3 tablets (1,500 mg total) by mouth Two (2) times a day  with 1 other capecitabine prescription for 1,800 mg total. Take Days 1 through 14, then 1 week off. (Patient not taking: Reported on 01/30/2022) 84 tablet 5   ??? carvediloL (COREG) 3.125 MG tablet Take 1 tablet (3.125 mg total) by mouth in the morning and 1 tablet (3.125 mg total) in the evening. Take with meals. 60 tablet 0   ??? clotrimazole (LOTRIMIN) 1 % cream Apply 1 application topically Two (2) times a day. (Patient not taking: Reported on 02/04/2022) 28 g 0   ??? dapagliflozin (FARXIGA) 10 mg Tab tablet Take 1 tablet (10 mg total) by mouth daily. (Patient not taking: Reported on 02/04/2022) 90 tablet 3   ??? DULoxetine (CYMBALTA) 30 MG capsule Take 1 capsule (30 mg total) by mouth daily. (Patient not taking: Reported on 02/04/2022) 90 capsule 3   ??? ferrous sulfate 325 (65 FE) MG tablet Take 1 tablet (325 mg total) by mouth in the morning. 90 tablet 3   ??? fluticasone propionate (FLONASE) 50 mcg/actuation nasal spray into each nostril. (Patient not taking: Reported on 12/18/2021)     ??? gabapentin (NEURONTIN) 100 MG capsule Take 1 capsule (100 mg total) by mouth Three (3) times a day. 270 capsule 3   ??? lancets (ACCU-CHEK SOFTCLIX LANCETS) Misc Used as directed to check your sugars once daily (Patient not taking: Reported on 02/04/2022) 100 each 1   ??? loperamide (IMODIUM) 2 mg capsule If having diarrhea, take 2 capsules (4 mg) by mouth after the first loose stool, then 1 capsule every 2 hours until diarrhea free for 12 hours.?? (Patient not taking: Reported on 02/04/2022) 60 capsule 11   ??? losartan (COZAAR) 25 MG tablet Take 1/2 tablet (12.5 mg total) by mouth daily. 45 tablet 3   ??? multivitamin (TAB-A-VITE/THERAGRAN) per tablet Take 1 tablet by mouth daily. (Patient not taking: Reported on 02/04/2022) 120 tablet 2   ??? nitroglycerin (NITROSTAT) 0.4 MG SL tablet Place 1 tablet (0.4 mg total) under the tongue every five (5) minutes as needed for chest pain. Max 3 tablets. If no relief, call 911 or go to ER. (Patient not taking: Reported on 12/18/2021) 30 each 3   ??? ondansetron (ZOFRAN) 8 MG tablet Take 1 tablet (8 mg total) by mouth every eight (8) hours as needed for nausea. (Patient not taking: Reported on 02/04/2022) 30 tablet 2   ??? prochlorperazine (COMPAZINE) 10 MG tablet Take 1 tablet (10 mg total) by mouth every six (6) hours as needed for nausea. (Patient not taking: Reported on 02/04/2022) 30 tablet 2   ??? spironolactone (ALDACTONE) 25 MG tablet Take 1 tablet (25 mg total) by mouth daily. 90 tablet 3     No current facility-administered medications for this visit.       Allergies/Adverse Events:  Allergies   Allergen Reactions   ??? Penicillins Other (See Comments)     Patient unsure of allergy          Lab Results  Lab Results   Component Value Date    PRO-BNP 264.0 (H) 01/03/2019    PRO-BNP 408.0 (H) 11/16/2018    PRO-BNP 1,070.0 (H) 10/13/2018    Creatinine 1.02 02/04/2022    Creatinine 0.99 01/09/2022    BUN 14 02/04/2022  BUN <5 (L) 01/09/2022    Potassium 4.2 02/04/2022    Potassium 4.2 01/09/2022    Magnesium 2.0 02/04/2022    Magnesium 1.8 11/30/2021    AST 10 02/04/2022    AST 9 12/18/2021    ALT <7 (L) 02/04/2022    ALT <7 (L) 12/18/2021    TSH 2.160 09/13/2018    Total Bilirubin 0.5 02/04/2022    Total Bilirubin 0.6 12/18/2021    INR 1.07 11/03/2021    INR 0.98 10/06/2018    WBC 8.3 02/04/2022    HGB 11.7 (L) 02/04/2022    HCT 38.4 (L) 02/04/2022    Platelet 261 02/04/2022    Cholesterol 122 01/30/2022    Triglycerides 117 01/30/2022    HDL 54 01/30/2022    Non-HDL Cholesterol 68 (L) 01/30/2022    LDL Calculated 45 01/30/2022    HGB A1C, POC 7.0 (H) 01/30/2022         Vital Signs:  There were no vitals filed for this visit.    BP Readings from Last 3 Encounters:   02/04/22 100/65   02/04/22 103/77   02/02/22 118/83     Pulse Readings from Last 3 Encounters:   02/04/22 78   02/04/22 76   02/02/22 91     Wt Readings from Last 3 Encounters:   02/04/22 86.5 kg (190 lb 11.2 oz)   02/04/22 86.7 kg (191 lb 3.2 oz) 02/02/22 87.5 kg (193 lb)      BMI Readings from Last 3 Encounters:   02/04/22 28.15 kg/m??   02/04/22 28.22 kg/m??   02/02/22 28.49 kg/m??          Review of Systems:  12 system review is negative except as noted above    Physical Exam:  General: patient is scattered with his thought  Abdomen: distended however patient denies tenderness  Extremities: 1+ at ankles

## 2022-02-09 NOTE — Unmapped (Signed)
I called Jaime Griffith to check on his use of capecitabine twice daily.  No answer on phone and mailbox full.     No answer from patient's sister.  Will message team.

## 2022-02-09 NOTE — Unmapped (Signed)
Pharmacy: First Cycle Chemotherapy Patient Education    Chemotherapy regimen/agents: CapeOx  Day 1 of chemotherapy: 02/09/22    I counseling Jaime Griffith about his new chemotherapy regimen.  Counseling points are below.  Jaime Griffith was asleep the first few times I came by to talk to him and then was complaining of severe arm pain when I saw him.  A friend was present.      Objective:      Allergies:    Allergies   Allergen Reactions   ??? Penicillins Other (See Comments)     Patient unsure of allergy       Vital Signs/Weight:    BP 100/65  - Pulse 78  - Temp 36.4 ??C (97.5 ??F) (Temporal)  - Resp 18  - Wt 86.5 kg (190 lb 11.2 oz)  - BMI 28.15 kg/m??      Adverse Effects:      Handout(s) provided from:  [facility] patient education [employer].    Adverse effects discussed included but were not limited to the following:      Neutropenia/Febrile Neutropenia, Thrombocytopenia, Anemia, Extravasation, Nausea/Vomiting, Peripheral Neuropathy and Temperature-dependent neuropathy   Fatigue  Rare, but serious side effects requiring immediate treatment and physician notification (e.g. fever, bleeding, clots).      Additional discussion:    ??? oxaliplatin infiltration - as documented elsewhere Jaime Griffith was treated for possible infiltration of his oxaliplatin.  This was a distraction to my discussion about the CapeOx handout and common side effects.  He reported significant pain and was concerned that his arm pain would worsen once he left.  We talked about usual treatment and need for oxaliplatin to be be absorbed.  In my judgement there was not clear fluid pocket to try to extract any fluid off of as he asked.      ??? Capecitabine dosing - I provided Jaime Griffith with a calendar with how to take capecitabine.  I picked up his medication from the outpatient pharmacy and we reviewed using the two different tablet sizes and when to take them.  My student and I reinforced several times and discussed the calendar with his friend. ??? dapagliflozin - I also picked this up from the [facility] and reviewed dosing with him.  ??? Reviewed use of nausea medications and loperamide as needed.      Approximate time spent with patient: 60 mins.  Patient had no further questions at this time.      Gardner Candle, PharmD, BCOP, CPP

## 2022-02-10 NOTE — Unmapped (Signed)
Attempt x 1 to reach patient by phone. Left voicemail and requested call back.     Reason for call: Check in with patient after first treatment on Wednesday, 2/22. Make sure patient has been taking capecitabine. Check on patient's arm, oxaliplatin extravasation.

## 2022-02-11 NOTE — Unmapped (Signed)
Attempt x 1 to reach patient by telephone today. Did not leave message as message left yesterday.     Reason for call: Check in with patient about capecitabine and arm following oxaliplatin extravasation.

## 2022-02-13 NOTE — Unmapped (Signed)
Left message saying we have wanted to make sure the arm is healing up and that he has been able to take the twice daily chemotherapy pills.  Briefly went over that schedule again on the capecitabine.  Asked that he call the nursing triage line with any issues with that arm or questions about the chemo pills.  Left triage number.

## 2022-02-13 NOTE — Unmapped (Signed)
Attempt x 1 to reach patient by telephone to check in. Left message and contact information and requested a call back.

## 2022-02-17 NOTE — Unmapped (Signed)
No answer from patient or sister.  Previously left messages.

## 2022-02-20 NOTE — Unmapped (Addendum)
Per GI Onc clinic/Brian Crandell, CPP request...    [facility] will not contact Patient regarding a Clinical assessment/refill coordination for capecitabine.  Clinic has been unable to reach patient and confirm if he is taking it correctly. [facility] will reschedule clinical assessment call one go ahead received from GI Onc clinic.    Horace Porteous, PharmD  [facility]

## 2022-02-23 NOTE — Unmapped (Unsigned)
[facility] GI MEDICAL ONCOLOGY CLINIC NOTE    Encounter Date: 02/25/2022    PCP:   Silver Huguenin, MD    Consulting Providers:  Dr. Inetta Fermo [facility] GI Surgery  ____________________________________________________________________  Oncology History:   Diagnosis:  colon adenocarcinoma  Current Goal of Therapy: surveillance  - 11/05/21 colonoscopy revealing a partially obstructing mass at ascending colon biopsy showing moderately/poorly differentiated adenocarcinoma with possible loss of MSH2 raising concern for Lynch Syndrome. Mass could not be traversed and cecum was not reached.   - 11/16/21 transferred to [facility] from OSH for management of a large bowel obstruction in proximal ascending colon  - 11/20/21 laparoscopic converted to open R hemicolectomy with path revealing a 5.5cm tumor moderately-differentiated adenocarcinoma at IC valve and a separate tumor at proximal ascending colon poorly-differentiated adenocarcinoma with focal mucinous features infiltrating into pericolonic adipose tissue, 7/43 LNs positive for carcinoma, negative margins, +PNI, +LVI. Staged as pT3N2b. There was again loss of MSH2. MSI-H. His post-op course was complicated by need for TPN and prolonged NGT to suction. He was weaned off both prior to discharge.   - 02/04/22-current CAPOX   - C1 1800mg  bid capecitabine   - 02/04/22 extravasation of oxaliplatin    Molecular: MSI-H. Loss of MSH2 on IHC.     ____________________________________________________________________  Assessment and Plan:  Jaime Griffith is a 51 y.o. male who presented for evaluation and recommendations regarding colon adenocarcinoma.     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 He underwent a R hemicolectomy revealing pT3N2b disease again with loss of MSH2. He is currently on CAPOX x 3-6 months pending tolerance.   - continue ***  - Surveillance:   - H&P + CEA: every 3-6 months x 2 years, then every 6 months x 3 years   - Scans: annually, due 12/23   - Colonoscopy: due 11/2022    Microcytic Anemia: suspect due to IDA from chronic blood loss.  - Hgb 11.7, MCV 77.1, MCH 23.5  Iron panel added:   - Iron 26 / TIBC 405 / Iron sat 6% / B12 453 / Ferritin 12.5  - Iron deficit roughly 770 mg  - Will discuss replacement at follow up visit.     HFrEF 2/2 ICM: most recent echo 11/04/21 with EF 20-25%. Well compensated on exam today.  - continue GDMT  - Followed up 2/20     CAD: s/p multiple PCIs. Follows with [name].  - continue ASA and high-dose statin     Loss of MSH2 c/f Lynch Syndrome: patient has declined genetics referral for full screening but will continue to discuss with future visits as he does have biologic children.   - genetics referral placed, scheduled 03/09/22    Hepatitis B S Ab reactive:   Hep B surface Ag non-reactive / HepB S Ab Reactive / Hep B Surf Ab Quant >1,000 / Hep B Core Total Ab Reactive   - Indicates early recovery and immunity. Infectivity unknown.  - repeat antibody testing in 3 months *not added at this time to treatment plan    Polysubstance Abuse:   - Smokes daily. Notes he used to smoke marijuana & cocaine but denies use today, despite being drowsy during exam, dozing off several times.   -  Due to concern for cardiac complications associated w/ cocaine + / - capecitabine, Utox added onto next cycle.     Extravasation  of upper extremity: secondary to oxalipaltin during C1    Psychosocial Limitations:   - Social work following   - Hydrologist but the application is still pending.   - Receives FoodStamps.   - No permanent residence  - Medicaid transportation through [facility] DSS  - Received groceries today through [place]    Follow-Up:  RTC in 3 weeks for labs, Stanford Breed, C3 CAPOX    _____________________________________________________________________  Interval History:  Jaime Griffith is a 51 y.o. male who presented for a return visit regarding colon adenocarcinoma.     ***    Past Medical History:  Ischemic HFrEF (EF 25-30%)  CAD c/b STEMI s/p PCI to LAD, RCA 2019  T2DM  Polysubstance abuse including tobacco, cocaine, MJ    Social History:  Patient is homeless, but is currently living in [place], [place] with his brother. Smokes daily. Notes he used to smoke marijuana, cocaine but no current use (notably upon asking again he has used cocaine very recently). No current job.     Review of Systems:  A ten-point review of systems was conducted and negative unless stated in HPI.    Physical Exam:  VITALS: There were no vitals taken for this visit.    CONSTITUTIONAL: in NAD, appears stated age, able to get on exam table unassisted, seems to doze off during interview, eyes closed often, speech somewhat slurred at times, follows commands, oriented x 4  HEENT: MMM, no oral lesions or exudates, neck supple  CARDIO: normal rate, regular rhythm, no murmurs, no JVD  RESPIRATORY: CTAB b/l, normal WOB on RA, no conversational dyspnea, reported exertional dyspnea  GI: soft, mild TTP throughout abdomen, ND, no appreciable HSM, well-healed midline abdominal scar  EXTREMITIES: no LE edema  SKIN: no rashes or ecchymoses, no mass noted behind R nipple  NEUROLOGIC: alert and oriented, no gross focal deficits noted   LYMPH: no supraclavicular, axillary, or cervical LAD  PSYCH: appropriate mood and affect, good insight and judgment     Labs:  Reviewed In Epic  ***    Imaging:  I personally reviewed the following imaging studies and my interpretation is:   ***

## 2022-02-25 NOTE — Unmapped (Signed)
Outpatient Oncology Social Work  Follow Up       Psychosocial & Case Management Update:  SW spoke with Pt on the phone after his team reported that they have not been able to reach Pt for a few weeks and he did not arrive for his infusion appt today. Pt states that he overslept today and missed his ride. He was given the number to contact his NN to discuss options/plan. SW offered to provide him with the number to [facility] for [employer], but Pt reports that he has been able to get rides from other people.    Pt asked about housing and his SSI. He states that he does not have any money for housing and has been trying to apply for SSI since 2019 and was denied and appealed the decision. He is not working with an attorney on the appeal and states that he has an appt in [place] tomorrow at the [employer] office there.     Pt was given the number to the [facility] [phone]). He states that he remembers speaking with someone in [place] about housing and some walk-in service, but was not sure who he spoke with and which program it was. SW explained that the helpline would be able to provide him with resources about emergency housing. Another resource is [facility] [employer] in [place], who has [employer], and [facility]) caseworkers available to assist homeless individuals with applying for SSI/SSDI and to help with housing. Pt may need to call the [employer] Hotline [phone]) for a housing assessment.    Follow-Up Plan:   Pt has this SW's contact information and will reach out for assistance as needed.      Annice Pih, LCSW  Oncology Outpatient Social Worker  [phone]

## 2022-02-25 NOTE — Unmapped (Signed)
Called and spoke with pt to get rescheduled from his appt with Dr Rozell Searing that he missed this morning to 3/22 at 10 am. Pt verbally confirmed appt

## 2022-02-25 NOTE — Unmapped (Addendum)
Attempt to reach patient by mobile phone. Left message. Reason for call: Patient scheduled to see Dr. Rozell Searing and Infusion today but has not arrived. Also to check in as patient started CapeOx 3 weeks ago.    Called and spoke to patient's sister, Ms. Torain. She has not spoken to patient in several weeks. States that the last time she spoke with him, he mentioned starting chemo. When she speaks with him, it is through [employer]. I provided my phone number to Ms. Torain to contact me if possible once she speaks to patient. Advised her that we were concerned about the patient. She took down my information and is in agreement with the plan.     Reaching out to social work team about possibility of setting up a welfare check.     Was able to reach patient later in the day. He states that he overslept today. Expressed that we were concerned about him having not been able to reach him. Had oxaliplatin extravasation with first treatment. He reports that his arm is still messed up. Has knot in his arm where the IV was, maybe the size of a dime, hard and tender, and dark color. Arm is normal in color. Tried to ask patient about left hand and fingers. Asked patient if sought medical attention as recommended. He states that he did not because it is difficult to get a ride.     Patient believes he may have taken capecitabine for maybe 5 days, he is unsure. States that he was out the whole day and didn't know and that is why he stopped. Left mouth feeling funny, things didn't taste right, and also discomfort. Felt sick to his stomach. Reports that these side effects have improved once he stopped taking the capecitabine. Allergies are bothering him now.     Advised patient that we would work on rescheduling his appointment for next available. He asked whether he should go in for his arm. Advised patient that if his arm is still bothering him, he should go to ED for evaluation. States that he will look up [facility] in [place] for phone number. Advised that social work would follow up. He verbalized understanding of all.

## 2022-02-26 NOTE — Unmapped (Signed)
Outpatient Oncology Social Work  Follow Up       Psychosocial & Case Management Update:  SW spoke with Pt over the phone. He states that he just finished his meeting at the [employer] in [place] regarding his disability. He states that he will hear back from them in 2 weeks or so. SW offered to provide him with information to [facility]. Pt states that he has heard of them before and has asked them about assistance with disability. He says that he will try to go in-person since he is currently in [place]. Pt asked for the phone number to [facility] [facility], which was provided [phone]). SW also explained that they assist with housing, but may need him to first call [employer] for an assessment [phone]). Pt accepted both numbers and states that he will give them a call.    Follow-Up Plan:   Pt has this SW's contact information and will reach out for assistance as needed.      Annice Pih, LCSW  Oncology Outpatient Social Worker  [phone]

## 2022-03-02 NOTE — Unmapped (Signed)
Genetics Appointment confirmation: WELL text to confirm upcoming appointment on 03/09/2022.

## 2022-03-02 NOTE — Unmapped (Addendum)
[facility] GI MEDICAL ONCOLOGY CLINIC NOTE    Encounter Date: 03/04/2022    PCP:   Jaime Huguenin, MD    Consulting Providers:  Dr. Inetta Griffith [facility] GI Surgery  ____________________________________________________________________  Oncology History:   Diagnosis:  colon adenocarcinoma  Current Goal of Therapy: surveillance  - 11/05/21 colonoscopy revealing a partially obstructing mass at ascending colon biopsy showing moderately/poorly differentiated adenocarcinoma with possible loss of MSH2 raising concern for Lynch Syndrome. Mass could not be traversed and cecum was not reached.   - 11/16/21 transferred to [facility] from OSH for management of a large bowel obstruction in proximal ascending colon  - 11/20/21 laparoscopic converted to open R hemicolectomy with path revealing a 5.5cm tumor moderately-differentiated adenocarcinoma at IC valve and a separate tumor at proximal ascending colon poorly-differentiated adenocarcinoma with focal mucinous features infiltrating into pericolonic adipose tissue, 7/43 LNs positive for carcinoma, negative margins, +PNI, +LVI. Staged as pT3N2b. There was again loss of MSH2. MSI-H. His post-op course was complicated by need for TPN and prolonged NGT to suction. He was weaned off both prior to discharge.   - 02/04/22 CAPOX x 1 cycle   - C1 1800mg  bid capecitabine   - 02/04/22 extravasation of oxaliplatin    Molecular: MSI-H. Loss of MSH2 on IHC.   ____________________________________________________________________  Assessment and Plan:  Jaime Griffith is a 51 y.o. male who presented for evaluation and recommendations regarding colon adenocarcinoma.     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 He underwent a R hemicolectomy revealing pT3N2b disease again with loss of MSH2. He got one cycle of CAPOX but then had extravasation of oxaliplatin then got lost to follow-up despite numerous attempts to contact him by our NN and CPP.     Today, discussed that I do not feel comfortable continuing to give him chemotherapy with him not following-up, not being willing to take our calls, etc. We will therefore move to close active surveillance given his high risk of recurrence.   - stop CAPOX  - Surveillance:   - H&P + CEA: every 3-6 months x 2 years, then every 6 months x 3 years   - Scans: will obtain first set of scans in 2 months then annually unless otherwise indicated   - Colonoscopy: due 11/2022    Extravasation of oxaliplatin: secondary to oxalipaltin during C1. Still with some pain at his LUE but no e/o skin break down  - ultrasound of LUE to evaluate for abscess/pocket of fluid  - warm compresses PRN    Microcytic Anemia: suspect due to IDA from chronic blood loss.  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 Well compensated on exam today.  - continue GDMT    CAD: s/p multiple PCIs. Follows with Jaime Griffith.  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 Notes he used to smoke marijuana & cocaine but denies use today, despite being drowsy during exam, dozing off several times.     Psychosocial Limitations:   - Social work following   - Hydrologist but the application is still pending.   - Receives FoodStamps.   - No permanent residence  - Medicaid transportation through [place] DSS  - Received groceries today through [employer]    Follow-Up:  RTC in 2 months with labs, scans, Jaime Griffith    I personally spent 40 minutes face-to-face and non-face-to-face in the care of this patient, which includes all pre, intra, and post visit time on the date of service.  All documented time was specific to the E/M visit and does not include any procedures that may have been performed.    _____________________________________________________________________  Interval History:  Jaime Griffith is a 51 y.o. male who presented for a return visit regarding colon adenocarcinoma.     At last visit, patient received his first oxaliplatin infusion which was unfortunately complicated by an extravasation due to patient falling asleep during the infusion.  He was recommended to go to the ED, however, he did not go as he felt like he was not dying.  Since that time, our nurse navigator Jaime Griffith and our pharmacist Jaime Griffith had tried numerous times to get in touch with him without success.  She was finally able to get in touch with him last week and was able to get him in for an appointment today.    Since last visit, he has noted that his left arm was improving for a while but now is more painful at the left antecubital fossa.  States that it feels sore, however, has no skin breakdown.  Denies fevers, chills, or other signs of infection.  He notes that his first cycle of chemotherapy went well until about 2 days before the capecitabine was to stop and he developed nausea so stopped his capecitabine medications.  He notes his energy remains low, however, he has no other concerns today including no fevers, chills, weight changes, appetite changes, chest pain, shortness of breath, abdominal pain, nausea, vomiting, diarrhea, lower extremity swelling, melena, hematochezia, or other complaints.    Past Medical History:  Ischemic HFrEF (EF 25-30%)  CAD c/b STEMI s/p PCI to LAD, RCA 2019  T2DM  Polysubstance abuse including tobacco, cocaine, MJ    Social History:  Patient is homeless, but is currently living in [place], [place] with his brother. Smokes daily. Notes he used to smoke marijuana, cocaine but no current use (notably upon asking again he has used cocaine very recently). No current job.     Review of Systems:  A ten-point review of systems was conducted and negative unless stated in HPI.    Physical Exam:  VITALS: BP 111/89  - Pulse 89  - Temp 37.3 ??C (99.1 ??F) (Temporal)  - Resp 16  - Wt 86 kg (189 lb 8 oz)  - SpO2 99%  - BMI 27.97 kg/m??   CONSTITUTIONAL: in NAD, appears stated age, able to get on exam table unassisted, seems to doze off during interview  EXTREMITIES: no LE edema  SKIN: no erythema or raised lesions on L antecubital fossa or forearm, no skin breakdown  NEUROLOGIC: alert and oriented, no gross focal deficits noted   LYMPH: no supraclavicular, axillary, or cervical LAD  PSYCH: appropriate mood and affect, good insight and judgment     Labs:  Reviewed In Epic  No new labs to review today    Imaging:  I personally reviewed the following imaging studies and my interpretation is:   No new imaging studies to review

## 2022-03-04 ENCOUNTER — Ambulatory Visit: Admit: 2022-03-04 | Discharge: 2022-03-05 | Payer: MEDICAID

## 2022-03-04 DIAGNOSIS — T80810D Extravasation of vesicant antineoplastic chemotherapy, subsequent encounter: Principal | ICD-10-CM

## 2022-03-04 DIAGNOSIS — Z79899 Other long term (current) drug therapy: Principal | ICD-10-CM

## 2022-03-04 DIAGNOSIS — C189 Malignant neoplasm of colon, unspecified: Principal | ICD-10-CM

## 2022-03-04 NOTE — Unmapped (Signed)
Hi,     Jaime Griffith contacted the Communication Center regarding the following:    - States that he will be an hour late due to transportation    Please contact Jaime Griffith at [phone].    Thanks in advance,    Rosary Lively  [facility] Communication Center   [phone]

## 2022-03-06 ENCOUNTER — Ambulatory Visit: Admit: 2022-03-06 | Discharge: 2022-03-07 | Payer: MEDICAID

## 2022-03-09 ENCOUNTER — Telehealth: Admit: 2022-03-09 | Payer: MEDICAID | Attending: Children | Primary: Children

## 2022-03-09 NOTE — Unmapped (Signed)
Patient was not connected to the video visit, left him a voicemail at 10:20 to explain how to get rescheduled for his personal history of colon cancer. We are happy to see Mr. Jaime Griffith at a future date and time that is convenient for him.

## 2022-03-09 NOTE — Unmapped (Signed)
Called and left a VM noting that he has some superficial thrombophlebitis on his L arm but no e/o fluid collection, abscess, or other abnormality. As we discussed, in clinic, recommended Tylenol and warm compresses for symptom relief.

## 2022-03-11 NOTE — Unmapped (Signed)
[facility] Navigation: Episode Resolved    Per chart and discussion with team members, pt is now on surveillance. Navigation Episode will be closed.    Please message our program through [facility] Oncology Navigation) as appropriate/needed.

## 2022-03-13 NOTE — Unmapped (Signed)
Other Call    ??? Caller name: Frantz  ??? Best callback number: [phone]  ??? Describe the reason for the call:patient needs documentation about all his medical conditions to give to his lawyer.  ??? PCP: Silver Huguenin, MD  ??? Last encounter in department: 01/30/2022

## 2022-03-23 NOTE — Unmapped (Signed)
Specialty Medication(s): capecitabine    [name] has been dis-enrolled from the [facility] specialty pharmacy services due to multiple unsuccessful outreach attempts by the pharmacy.as well as clinic.  Prescriptions have been discontinued    Additional information provided to the patient: NA    Jaime Griffith A [facility] Specialty Pharmacist

## 2022-04-06 DIAGNOSIS — C189 Malignant neoplasm of colon, unspecified: Principal | ICD-10-CM

## 2022-04-10 ENCOUNTER — Ambulatory Visit
Admit: 2022-04-10 | Discharge: 2022-04-11 | Payer: MEDICAID | Attending: Student in an Organized Health Care Education/Training Program | Primary: Student in an Organized Health Care Education/Training Program

## 2022-04-10 DIAGNOSIS — E1142 Type 2 diabetes mellitus with diabetic polyneuropathy: Principal | ICD-10-CM

## 2022-04-10 DIAGNOSIS — Z794 Long term (current) use of insulin: Principal | ICD-10-CM

## 2022-04-10 DIAGNOSIS — I5020 Unspecified systolic (congestive) heart failure: Principal | ICD-10-CM

## 2022-04-10 DIAGNOSIS — F32A Depression, unspecified depression type: Principal | ICD-10-CM

## 2022-04-10 DIAGNOSIS — Z5941 Food insecurity: Principal | ICD-10-CM

## 2022-04-10 DIAGNOSIS — C189 Malignant neoplasm of colon, unspecified: Principal | ICD-10-CM

## 2022-04-10 DIAGNOSIS — I2510 Atherosclerotic heart disease of native coronary artery without angina pectoris: Principal | ICD-10-CM

## 2022-04-10 MED ORDER — DULOXETINE 30 MG CAPSULE,DELAYED RELEASE
ORAL_CAPSULE | Freq: Every day | ORAL | 3 refills | 90.00000 days | Status: CN
Start: 2022-04-10 — End: 2023-04-10

## 2022-04-10 MED ORDER — LOSARTAN 25 MG TABLET
ORAL_TABLET | Freq: Every day | ORAL | 3 refills | 45 days | Status: CP
Start: 2022-04-10 — End: ?
  Filled 2022-04-10: qty 45, 45d supply, fill #0

## 2022-04-10 MED ORDER — ATORVASTATIN 80 MG TABLET
ORAL_TABLET | Freq: Every day | ORAL | 3 refills | 90 days | Status: CP
Start: 2022-04-10 — End: 2023-04-10

## 2022-04-10 MED ORDER — GABAPENTIN 100 MG CAPSULE
ORAL_CAPSULE | Freq: Three times a day (TID) | ORAL | 3 refills | 90 days | Status: CP
Start: 2022-04-10 — End: 2023-04-10
  Filled 2022-04-22: qty 270, 90d supply, fill #0

## 2022-04-10 MED ORDER — ASCORBIC ACID (VITAMIN C) 250 MG TABLET
ORAL_TABLET | Freq: Every day | ORAL | 3 refills | 90 days | Status: CN
Start: 2022-04-10 — End: 2023-04-05

## 2022-04-10 MED ORDER — CARVEDILOL 3.125 MG TABLET
ORAL_TABLET | Freq: Two times a day (BID) | ORAL | 3 refills | 90 days | Status: CP
Start: 2022-04-10 — End: 2023-04-10
  Filled 2022-04-10: qty 180, 90d supply, fill #0

## 2022-04-10 MED ORDER — SPIRONOLACTONE 25 MG TABLET
ORAL_TABLET | Freq: Every day | ORAL | 3 refills | 90 days | Status: CN
Start: 2022-04-10 — End: ?

## 2022-04-10 MED ORDER — ASPIRIN 81 MG TABLET,DELAYED RELEASE
ORAL_TABLET | Freq: Every day | ORAL | 3 refills | 90 days | Status: CP
Start: 2022-04-10 — End: 2023-04-10
  Filled 2022-04-10: qty 90, 90d supply, fill #0

## 2022-04-20 ENCOUNTER — Ambulatory Visit: Admit: 2022-04-20 | Discharge: 2022-04-20 | Payer: MEDICAID

## 2022-04-21 DIAGNOSIS — E118 Type 2 diabetes mellitus with unspecified complications: Secondary | ICD-10-CM

## 2022-04-21 LAB — GLUCOSE, POCT (MANUAL RESULT ENTRY): POC Glucose: 349 mg/dl — AB (ref 70–99)

## 2022-04-21 NOTE — Congregational Nurse Program (Signed)
?  Dept: [phone] ? ? ?Congregational Nurse Program Note ? ?Date of Encounter: 04/21/2022 ?Client to clinic for the first time in several months. He reports doing well. Has all his care at [facility], next appointment tomorrow 5/10. He is no longer getting chemo for his his stage 3 colon cancer, diagnosed last fall.  ?Non-fasting blood glucose today was 349. He reports he is taking farxiga now. No current health care needs. Support given.  ?Past Medical History: ?Past Medical History:  ?Diagnosis Date  ? Abrasion of toe with infection, right, subsequent encounter 03/27/2021  ? CHF (congestive heart failure) ([place])   ? Diabetes mellitus without complication ([place])   ? Hypertension   ? ? ?Encounter Details: ? CNP Questionnaire - 04/21/22 1100   ? ?  ? Questionnaire  ? Do you give verbal consent to treat you today? Yes   ? Location Patient [place]   ? Visit Setting Church or Organization   ? Patient Status Unknown   currently staying with a friend in [place] Medicaid   ? Insurance Referral N/A   ? Medication N/A   cleint is now getting his medications at [facility]  ? Medical Provider Yes   [facility] PCP , newly assigned, client has had his first appointment  ? Screening Referrals N/A   cleint had colonscopy 11/22. Hx colon ca, s/p surgery w/resection  ? Medical Referral N/A   ? Medical Appointment Made N/A   client has appointments all [facility] up at [facility]  ? Food N/A   has food stampts 280/month  ? Transportation N/A   frineds provided transportation  ? Housing/Utilities No permanent housing   currently living with a frined, awaiting disability approval  ? Interpersonal Safety N/A   ? Intervention Blood pressure;Blood glucose;Support   ? ED Visit Averted N/A   ? Life-Saving Intervention Made N/A   ? ?  ?  ? ?  ? ? ? ? ?

## 2022-04-22 ENCOUNTER — Ambulatory Visit: Admit: 2022-04-22 | Discharge: 2022-04-22 | Payer: MEDICAID

## 2022-04-22 DIAGNOSIS — R911 Solitary pulmonary nodule: Principal | ICD-10-CM

## 2022-04-22 DIAGNOSIS — D500 Iron deficiency anemia secondary to blood loss (chronic): Principal | ICD-10-CM

## 2022-04-22 DIAGNOSIS — C189 Malignant neoplasm of colon, unspecified: Principal | ICD-10-CM

## 2022-04-28 ENCOUNTER — Encounter: Payer: Self-pay | Admitting: Emergency Medicine

## 2022-04-28 ENCOUNTER — Other Ambulatory Visit: Payer: Self-pay

## 2022-04-28 DIAGNOSIS — K047 Periapical abscess without sinus: Secondary | ICD-10-CM | POA: Insufficient documentation

## 2022-04-28 DIAGNOSIS — K029 Dental caries, unspecified: Secondary | ICD-10-CM | POA: Insufficient documentation

## 2022-04-28 DIAGNOSIS — Z7984 Long term (current) use of oral hypoglycemic drugs: Secondary | ICD-10-CM | POA: Diagnosis not present

## 2022-04-28 DIAGNOSIS — Z794 Long term (current) use of insulin: Secondary | ICD-10-CM | POA: Diagnosis not present

## 2022-04-28 DIAGNOSIS — Z79899 Other long term (current) drug therapy: Secondary | ICD-10-CM | POA: Insufficient documentation

## 2022-04-28 DIAGNOSIS — I2510 Atherosclerotic heart disease of native coronary artery without angina pectoris: Secondary | ICD-10-CM | POA: Insufficient documentation

## 2022-04-28 DIAGNOSIS — I5022 Chronic systolic (congestive) heart failure: Secondary | ICD-10-CM | POA: Insufficient documentation

## 2022-04-28 DIAGNOSIS — S39012A Strain of muscle, fascia and tendon of lower back, initial encounter: Secondary | ICD-10-CM | POA: Diagnosis not present

## 2022-04-28 DIAGNOSIS — E1165 Type 2 diabetes mellitus with hyperglycemia: Secondary | ICD-10-CM | POA: Insufficient documentation

## 2022-04-28 DIAGNOSIS — W04XXXA Fall while being carried or supported by other persons, initial encounter: Secondary | ICD-10-CM | POA: Insufficient documentation

## 2022-04-28 DIAGNOSIS — I110 Hypertensive heart disease with heart failure: Secondary | ICD-10-CM | POA: Diagnosis not present

## 2022-04-28 DIAGNOSIS — S161XXA Strain of muscle, fascia and tendon at neck level, initial encounter: Secondary | ICD-10-CM | POA: Diagnosis not present

## 2022-04-28 DIAGNOSIS — S3992XA Unspecified injury of lower back, initial encounter: Secondary | ICD-10-CM | POA: Diagnosis present

## 2022-04-28 LAB — CBG MONITORING, ED: Glucose-Capillary: 253 mg/dL — ABNORMAL HIGH (ref 70–99)

## 2022-04-28 NOTE — ED Triage Notes (Signed)
Pt arrived via POV with reports of neck and back pain, feet and leg pain as well as left side dental pain, pt frequently nodding off in triage.  ? ?Pt states he was dropped off by someone.  ?

## 2022-04-29 ENCOUNTER — Emergency Department
Admission: EM | Admit: 2022-04-29 | Discharge: 2022-04-29 | Disposition: A | Discharge status: Home / Self Care | Payer: Medicaid Other | Attending: Emergency Medicine | Admitting: Emergency Medicine

## 2022-04-29 DIAGNOSIS — K047 Periapical abscess without sinus: Secondary | ICD-10-CM

## 2022-04-29 DIAGNOSIS — S161XXA Strain of muscle, fascia and tendon at neck level, initial encounter: Secondary | ICD-10-CM

## 2022-04-29 DIAGNOSIS — K029 Dental caries, unspecified: Secondary | ICD-10-CM

## 2022-04-29 DIAGNOSIS — K0889 Other specified disorders of teeth and supporting structures: Secondary | ICD-10-CM

## 2022-04-29 DIAGNOSIS — S39012A Strain of muscle, fascia and tendon of lower back, initial encounter: Secondary | ICD-10-CM

## 2022-04-29 MED ORDER — CLINDAMYCIN HCL 300 MG PO CAPS: 300.0000 mg | ORAL_CAPSULE | Freq: Three times a day (TID) | ORAL | 0 refills | Status: DC

## 2022-04-29 MED ORDER — IBUPROFEN 800 MG PO TABS
800.0000 mg | ORAL_TABLET | Freq: Once | ORAL | Status: AC
  Administered 2022-04-29: 800 mg via ORAL
  Filled 2022-04-29: qty 1

## 2022-04-29 MED ORDER — CLINDAMYCIN HCL 150 MG PO CAPS
300.0000 mg | ORAL_CAPSULE | Freq: Once | ORAL | Status: AC
  Administered 2022-04-29: 300 mg via ORAL
  Filled 2022-04-29: qty 2

## 2022-04-29 MED ORDER — MELOXICAM 7.5 MG PO TABS: 7.5000 mg | ORAL_TABLET | Freq: Every day | ORAL | 0 refills | Status: AC

## 2022-04-29 MED ORDER — LIDOCAINE VISCOUS HCL 2 % MT SOLN
15.0000 mL | Freq: Once | OROMUCOSAL | Status: AC
  Administered 2022-04-29: 15 mL via OROMUCOSAL
  Filled 2022-04-29: qty 15

## 2022-04-29 NOTE — ED Notes (Signed)
Patient left prior to receiving discharge instructions, his prescriptions, and his paperwork. Appeared annoyed when he walked away and didn't say anything except, "I'm going to another hospital. ?

## 2022-04-29 NOTE — Discharge Instructions (Signed)
You may take Mobic as needed for pain.  Take antibiotic as prescribed for dental infection.  Return to the ER for worsening symptoms, persistent vomiting, fever or other concerns. ?
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
## 2022-04-29 NOTE — ED Notes (Signed)
After walking out of the ED, Probation officer received a message from triage stating that the pt wanted his paperwork and prescriptions. Reviewed discharge instructions with verbal understanding. ?

## 2022-05-14 ENCOUNTER — Ambulatory Visit: Admit: 2022-05-14 | Discharge: 2022-05-15 | Payer: MEDICAID

## 2022-05-14 DIAGNOSIS — N5201 Erectile dysfunction due to arterial insufficiency: Principal | ICD-10-CM

## 2022-05-14 DIAGNOSIS — N486 Induration penis plastica: Principal | ICD-10-CM

## 2022-05-14 MED ORDER — TADALAFIL 5 MG TABLET
ORAL_TABLET | Freq: Every day | ORAL | 3 refills | 90 days | Status: CP
Start: 2022-05-14 — End: ?

## 2022-05-22 MED FILL — FARXIGA 10 MG TABLET: ORAL | 90 days supply | Qty: 90 | Fill #1

## 2022-06-25 DIAGNOSIS — E118 Type 2 diabetes mellitus with unspecified complications: Secondary | ICD-10-CM

## 2022-06-25 LAB — GLUCOSE, POCT (MANUAL RESULT ENTRY): POC Glucose: 134 mg/dl — AB (ref 70–99)

## 2022-06-25 NOTE — Congregational Nurse Program (Signed)
  Dept: [place] Nurse Program Note  Date of Encounter: 06/25/2022 Client to clinic for vital sign and blood glucose check.  BP 112/68 (BP Location: Left Arm, Patient Position: Sitting, Cuff Size: Large)   Pulse 79   SpO2 94%  Blood glucose 134, non-fasting. He is taking [place] regularly. Has follow up appointments in August at his oncologist at [facility]. He has changed his phone number since his last apt. RN gave him the MD office number so he could call and update his information. No other needs at this time. Past Medical History: Past Medical History:  Diagnosis Date   Abrasion of toe with infection, right, subsequent encounter 03/27/2021   CHF (congestive heart failure) ([place])    Diabetes mellitus without complication ([place])    Hypertension     Encounter Details:  CNP Questionnaire - 06/25/22 1239       Questionnaire   Do you give verbal consent to treat you today? Yes    Location Patient [place] or Organization    Patient Status Unknown   currently staying with a friend in [facility] Referral N/A    Medication N/A   cleint is now getting his medications at [facility] Provider Yes   [facility] PCP , newly assigned, client has had his first appointment   Screening Referrals N/A   cleint had colonscopy 11/22. Hx colon ca, s/p surgery w/resection   Medical Referral N/A    Medical Appointment Made N/A   client has appointments all set up at [place]   has food stampts 280/month   Transportation N/A   cleint has friends that provide transportation, can also utilize medicaid transport   Housing/Utilities No permanent housing   currently living with a frined, awaiting disability approval   Interpersonal Safety N/A    Intervention Blood pressure;Blood glucose;Support;Navigate Healthcare System    ED Visit Averted N/A    Life-Saving Intervention Made N/A

## 2022-09-23 MED FILL — ASPIRIN 81 MG TABLET,DELAYED RELEASE: ORAL | 90 days supply | Qty: 90 | Fill #1

## 2022-09-23 MED FILL — FARXIGA 10 MG TABLET: ORAL | 90 days supply | Qty: 90 | Fill #2

## 2022-09-23 MED FILL — CARVEDILOL 3.125 MG TABLET: ORAL | 90 days supply | Qty: 180 | Fill #1

## 2022-09-23 MED FILL — GABAPENTIN 100 MG CAPSULE: ORAL | 90 days supply | Qty: 270 | Fill #1

## 2022-09-23 MED FILL — LOSARTAN 25 MG TABLET: ORAL | 45 days supply | Qty: 45 | Fill #1

## 2022-10-09 ENCOUNTER — Ambulatory Visit
Admit: 2022-10-09 | Discharge: 2022-10-10 | Disposition: A | Discharge status: Home / Self Care | Payer: MEDICAID | Attending: Student in an Organized Health Care Education/Training Program | Primary: Student in an Organized Health Care Education/Training Program

## 2022-10-09 ENCOUNTER — Ambulatory Visit: Admit: 2022-10-09 | Discharge: 2022-10-10 | Disposition: A | Discharge status: Home / Self Care | Payer: MEDICAID

## 2022-10-09 DIAGNOSIS — F32A Depression, unspecified depression type: Principal | ICD-10-CM

## 2022-10-09 DIAGNOSIS — I5020 Unspecified systolic (congestive) heart failure: Principal | ICD-10-CM

## 2022-10-09 DIAGNOSIS — R079 Chest pain, unspecified: Principal | ICD-10-CM

## 2022-10-09 DIAGNOSIS — Z794 Long term (current) use of insulin: Principal | ICD-10-CM

## 2022-10-09 DIAGNOSIS — I2510 Atherosclerotic heart disease of native coronary artery without angina pectoris: Principal | ICD-10-CM

## 2022-10-09 DIAGNOSIS — E1142 Type 2 diabetes mellitus with diabetic polyneuropathy: Principal | ICD-10-CM

## 2022-10-09 DIAGNOSIS — Z85038 Personal history of other malignant neoplasm of large intestine: Principal | ICD-10-CM

## 2022-10-09 DIAGNOSIS — R140 Abdominal distension (gaseous): Principal | ICD-10-CM

## 2022-10-09 MED ORDER — ASPIRIN 81 MG TABLET,DELAYED RELEASE
ORAL_TABLET | Freq: Every day | ORAL | 3 refills | 90.00000 days | Status: CP
Start: 2022-10-09 — End: 2023-10-09

## 2022-10-09 MED ORDER — CARVEDILOL 3.125 MG TABLET
ORAL_TABLET | Freq: Two times a day (BID) | ORAL | 3 refills | 90.00000 days | Status: CP
Start: 2022-10-09 — End: 2023-10-09

## 2022-10-09 MED ORDER — NITROGLYCERIN 0.4 MG SUBLINGUAL TABLET
SUBLINGUAL | 3 refills | 1 days | Status: CP | PRN
Start: 2022-10-09 — End: ?

## 2022-10-09 MED ORDER — DULOXETINE 30 MG CAPSULE,DELAYED RELEASE
ORAL_CAPSULE | Freq: Every day | ORAL | 3 refills | 90.00000 days | Status: CP
Start: 2022-10-09 — End: 2023-10-09

## 2022-10-09 MED ORDER — ATORVASTATIN 80 MG TABLET
ORAL_TABLET | Freq: Every day | ORAL | 3 refills | 90 days | Status: CP
Start: 2022-10-09 — End: 2022-10-09

## 2022-10-09 MED ORDER — LOSARTAN 25 MG TABLET
ORAL_TABLET | Freq: Every day | ORAL | 3 refills | 45.00000 days | Status: CP
Start: 2022-10-09 — End: 2022-10-09

## 2022-10-09 MED ORDER — TADALAFIL 5 MG TABLET
ORAL_TABLET | Freq: Every day | ORAL | 3 refills | 90 days | Status: CP
Start: 2022-10-09 — End: ?

## 2022-10-09 MED ORDER — DAPAGLIFLOZIN PROPANEDIOL 10 MG TABLET
ORAL_TABLET | Freq: Every day | ORAL | 3 refills | 90.00000 days | Status: CP
Start: 2022-10-09 — End: 2022-10-09

## 2022-10-16 ENCOUNTER — Ambulatory Visit
Admit: 2022-10-16 | Payer: MEDICAID | Attending: Student in an Organized Health Care Education/Training Program | Primary: Student in an Organized Health Care Education/Training Program

## 2022-10-21 DIAGNOSIS — E118 Type 2 diabetes mellitus with unspecified complications: Secondary | ICD-10-CM

## 2022-10-21 LAB — GLUCOSE, POCT (MANUAL RESULT ENTRY): POC Glucose: 237 mg/dl — AB (ref 70–99)

## 2022-10-21 NOTE — Congregational Nurse Program (Unsigned)
  Dept: [phone]   Congregational Nurse Program Note  Date of Encounter: 10/21/2022 Client to [facility] day center for the first time since July. Reports he is still attending PCP apts in [facility]. Requested vitals and blood glucose check. BP 130/82 (BP Location: Left Arm, Patient Position: Sitting, Cuff Size: Large)   Pulse 91   SpO2 100% , no fasting glucose 237. He reports he is taking all his medications as ordered. No other concerns or needs.  Past Medical History: Past Medical History:  Diagnosis Date   Abrasion of toe with infection, right, subsequent encounter 03/27/2021   CHF (congestive heart failure) ([place])    Diabetes mellitus without complication ([place])    Hypertension     Encounter Details:  CNP Questionnaire - 10/21/22 1010       Questionnaire   Ask client: Do you give verbal consent for me to treat you today? Yes    Student Assistance N/A    Location Patient [place]    Visit Setting with Client Organization    Patient Status Unknown   client staying with friends or family   Insurance Medicaid    Insurance/Financial Assistance Referral N/A    Medication N/A    Medical Provider Yes   PCP at [facility] clinic   Screening Referrals Made N/A    Medical Referrals Made N/A    Medical Appointment Made N/A   client has appointments all set up at [place]   has food stampts 280/month   Transportation N/A   client has friends that provide transportation, can also utilize medicaid transport   Housing/Utilities No permanent housing   currently living with a frined, awaiting disability approval   Interpersonal Safety N/A    Interventions Advocate/Support;Educate    Abnormal to Normal Screening Since Last CN Visit N/A    Screenings CN Performed Blood Pressure;Blood Glucose;Pulse Ox    Sent Client to Lab for: N/A    Did client attend any of the following based off CNs referral or appointments made? N/A    ED Visit Averted N/A     Life-Saving Intervention Made N/A

## 2022-11-04 IMAGING — CR DG ABDOMEN 2V
1 series · 3 of 3 positions shown · non-contrast
Comparison: CT [DATE]

CLINICAL DATA: Small-bowel obstruction

EXAM:
ABDOMEN - 2 VIEW

[Series 1: dg abd 2 views · 0.14mm/px · 3 of 3 slices shown]
[im 1/3]
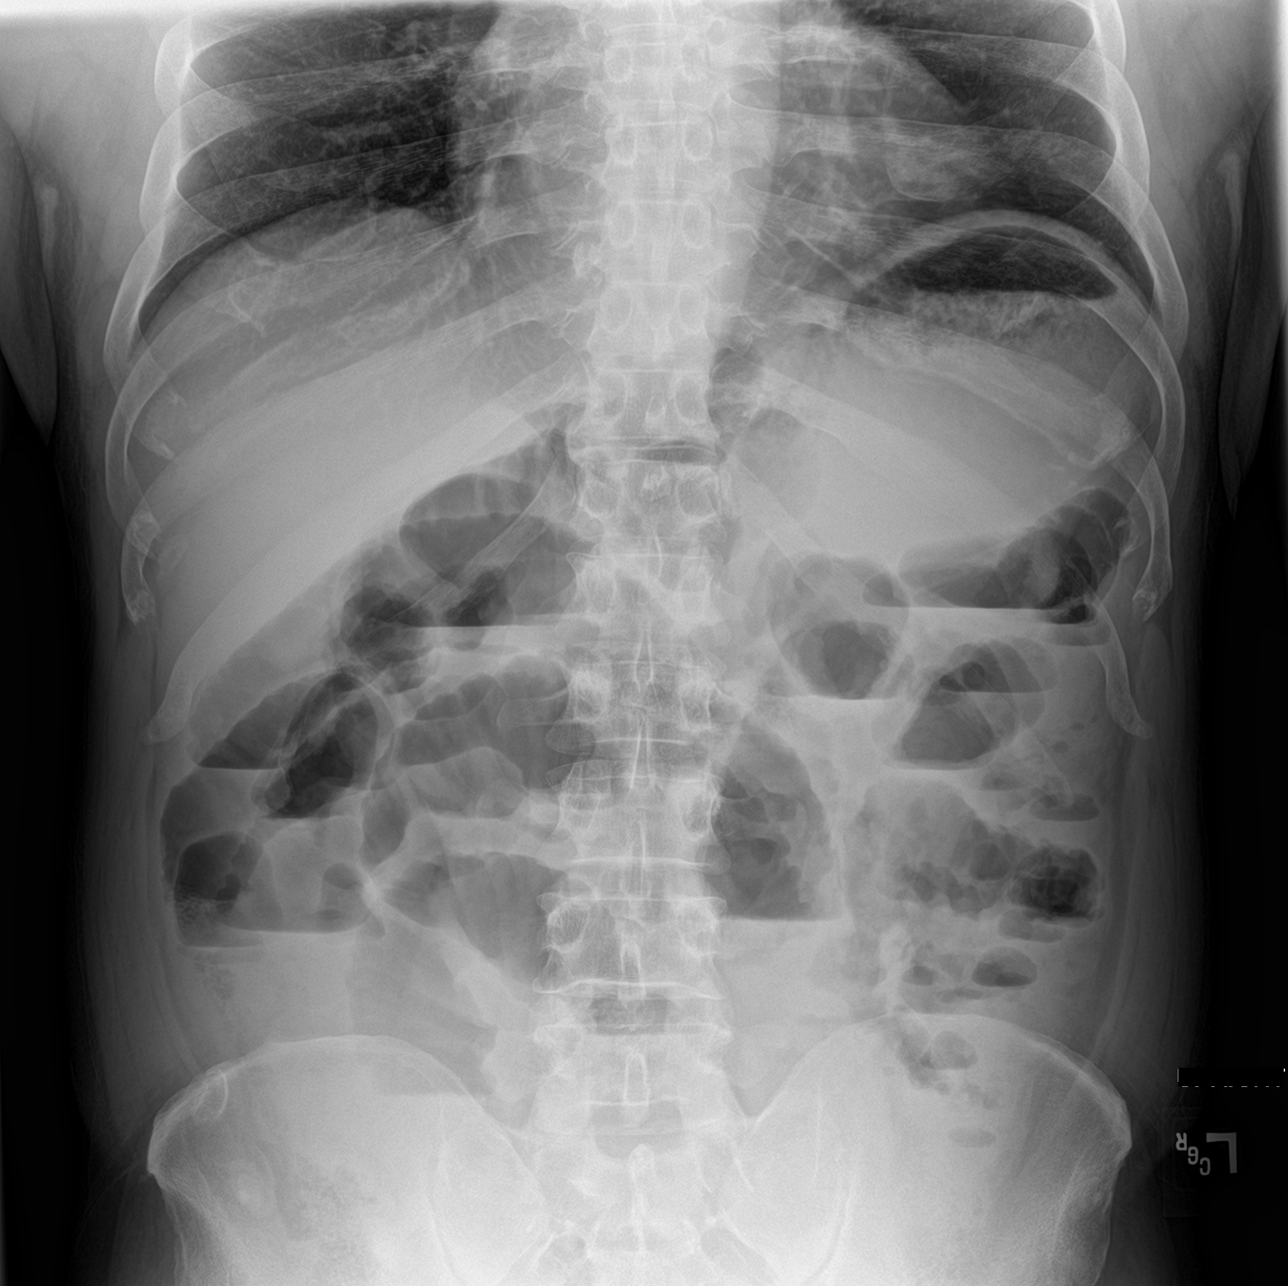
[im 2/3]
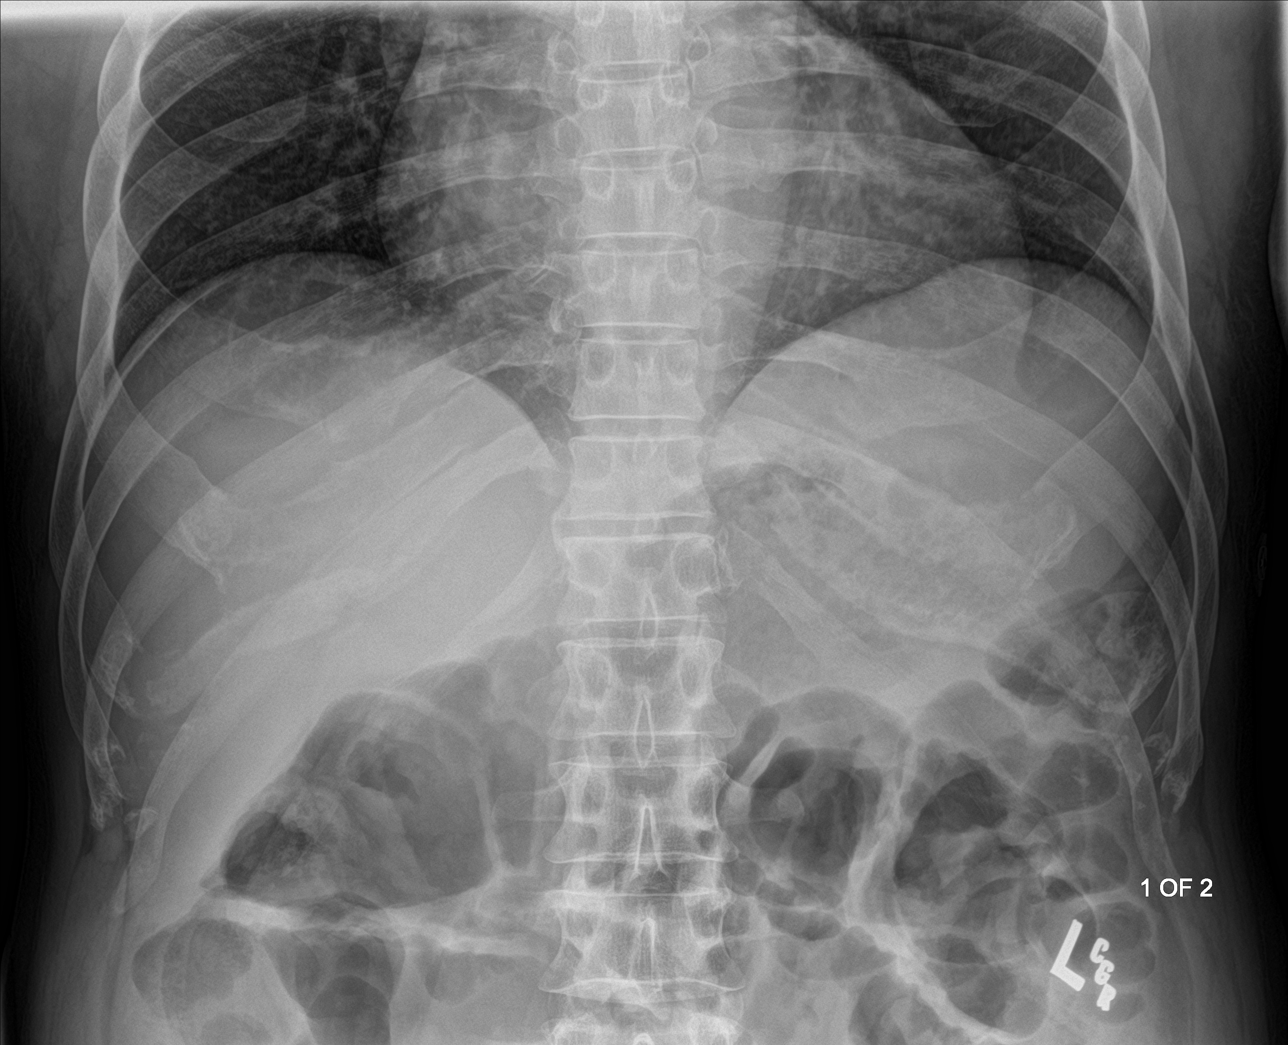
[im 3/3]
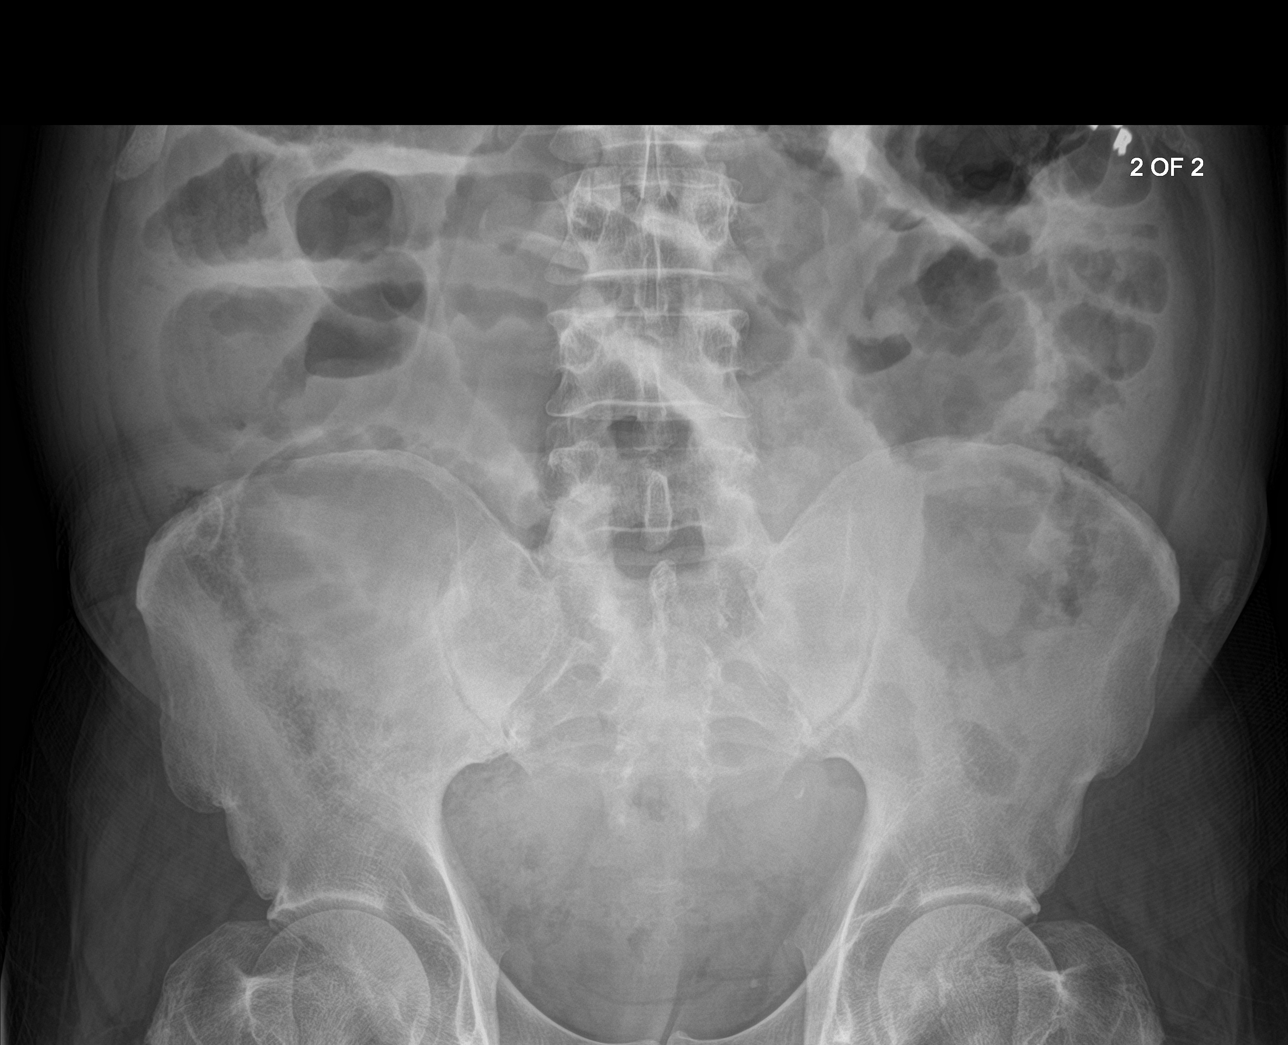

[3 of 3 positions shown; findings below may reference images not displayed]

FINDINGS: Persistent diffusely dilated loops of small bowel in the abdomen.
There is no evidence of free intraperitoneal gas. No acute osseous
abnormality.
IMPRESSION: Persistent small bowel obstruction.

## 2022-11-26 DIAGNOSIS — E118 Type 2 diabetes mellitus with unspecified complications: Secondary | ICD-10-CM

## 2022-11-26 LAB — GLUCOSE, POCT (MANUAL RESULT ENTRY): POC Glucose: 320 mg/dl — AB (ref 70–99)

## 2022-11-26 NOTE — Congregational Nurse Program (Signed)
  Dept: [phone]   Congregational Nurse Program Note  Date of Encounter: 11/26/2022 Client to [facility] clinic for vital sign and blood glucose check. BP 132/72 (BP Location: Left Arm, Patient Position: Sitting, Cuff Size: Large)   Pulse 82   SpO2 99%  Nonfasting blood glucose 320. Client reports he has not been taking his medications, unclear as to whether he does not have the copay or just has not gone to get the refills. Client was hoping to get some day work today, awaiting a call from a "friend". Support given.   Past Medical History: Past Medical History:  Diagnosis Date   Abrasion of toe with infection, right, subsequent encounter 03/27/2021   CHF (congestive heart failure) ([place])    Diabetes mellitus without complication ([place])    Hypertension     Encounter Details:  CNP Questionnaire - 11/26/22 0940       Questionnaire   Ask client: Do you give verbal consent for me to treat you today? Yes    Student Assistance N/A    Location Patient [place]    Visit Setting with Client Organization    Patient Status Unhoused   client staying with friends or family   Insurance Medicaid    Insurance/Financial Assistance Referral N/A    Medication Have Medication Insecurities   client is very unclear as to how he gets his [employer] Rx, he reports they are at [facility], and that he has a copay   Medical Provider Yes   PCP at [facility] clinic   Screening Referrals [place]   client has appointments all set up at [facility]   Recently w/o PCP, now 1st time PCP visit completed due to CNs referral or appointment made [place]   has food stampts 280/month   Transportation N/A   client has friends that provide transportation, can also utilize [employer] and [employer] bus system   Housing/Utilities No permanent housing   currently living with a frined, awaiting disability approval   Interpersonal Safety N/A     Interventions Advocate/Support;Educate    Abnormal to Normal Screening Since Last CN Visit N/A    Screenings CN Performed Blood Pressure;Blood Glucose;Pulse Ox    Sent Client to Lab for: N/A    Did client attend any of the following based off CNs referral or appointments made? N/A    ED Visit Averted N/A    Life-Saving Intervention Made N/A

## 2022-12-18 ENCOUNTER — Other Ambulatory Visit: Payer: Self-pay

## 2022-12-18 ENCOUNTER — Emergency Department
Admission: EM | Admit: 2022-12-18 | Discharge: 2022-12-18 | Disposition: A | Discharge status: Home / Self Care | Payer: BLUE CROSS/BLUE SHIELD | Attending: Emergency Medicine | Admitting: Emergency Medicine

## 2022-12-18 DIAGNOSIS — E119 Type 2 diabetes mellitus without complications: Secondary | ICD-10-CM | POA: Diagnosis not present

## 2022-12-18 DIAGNOSIS — F1990 Other psychoactive substance use, unspecified, uncomplicated: Secondary | ICD-10-CM | POA: Insufficient documentation

## 2022-12-18 DIAGNOSIS — Z1152 Encounter for screening for COVID-19: Secondary | ICD-10-CM | POA: Diagnosis not present

## 2022-12-18 DIAGNOSIS — I110 Hypertensive heart disease with heart failure: Secondary | ICD-10-CM | POA: Insufficient documentation

## 2022-12-18 DIAGNOSIS — I509 Heart failure, unspecified: Secondary | ICD-10-CM | POA: Diagnosis not present

## 2022-12-18 DIAGNOSIS — F1910 Other psychoactive substance abuse, uncomplicated: Secondary | ICD-10-CM | POA: Insufficient documentation

## 2022-12-18 DIAGNOSIS — F1414 Cocaine abuse with cocaine-induced mood disorder: Secondary | ICD-10-CM | POA: Diagnosis present

## 2022-12-18 DIAGNOSIS — F1994 Other psychoactive substance use, unspecified with psychoactive substance-induced mood disorder: Secondary | ICD-10-CM | POA: Diagnosis not present

## 2022-12-18 DIAGNOSIS — R45851 Suicidal ideations: Secondary | ICD-10-CM | POA: Insufficient documentation

## 2022-12-18 DIAGNOSIS — F17200 Nicotine dependence, unspecified, uncomplicated: Secondary | ICD-10-CM | POA: Diagnosis not present

## 2022-12-18 LAB — CBC
HCT: 48.5 % (ref 39.0–52.0)
Hemoglobin: 15.1 g/dL (ref 13.0–17.0)
MCH: 29.8 pg (ref 26.0–34.0)
MCHC: 31.1 g/dL (ref 30.0–36.0)
MCV: 95.7 fL (ref 80.0–100.0)
Platelets: 242 10*3/uL (ref 150–400)
RBC: 5.07 MIL/uL (ref 4.22–5.81)
RDW: 13.5 % (ref 11.5–15.5)
WBC: 5.6 10*3/uL (ref 4.0–10.5)
nRBC: 0 % (ref 0.0–0.2)

## 2022-12-18 LAB — COMPREHENSIVE METABOLIC PANEL
ALT: 47 U/L — ABNORMAL HIGH (ref 0–44)
AST: 41 U/L (ref 15–41)
Albumin: 3.9 g/dL (ref 3.5–5.0)
Alkaline Phosphatase: 89 U/L (ref 38–126)
Anion gap: 5 (ref 5–15)
BUN: 13 mg/dL (ref 6–20)
CO2: 26 mmol/L (ref 22–32)
Calcium: 8.8 mg/dL — ABNORMAL LOW (ref 8.9–10.3)
Chloride: 107 mmol/L (ref 98–111)
Creatinine, Ser: 1.01 mg/dL (ref 0.61–1.24)
GFR, Estimated: >60 mL/min (ref >60)
Glucose, Bld: 152 mg/dL — ABNORMAL HIGH (ref 70–99)
Potassium: 4.5 mmol/L (ref 3.5–5.1)
Sodium: 138 mmol/L (ref 135–145)
Total Bilirubin: 0.5 mg/dL (ref 0.3–1.2)
Total Protein: 7.3 g/dL (ref 6.5–8.1)

## 2022-12-18 LAB — ACETAMINOPHEN LEVEL: Acetaminophen (Tylenol), Serum: <10 ug/mL — ABNORMAL LOW (ref 10–30)

## 2022-12-18 LAB — URINE DRUG SCREEN, QUALITATIVE (ARMC ONLY)
Amphetamines, Ur Screen: NOT DETECTED
Barbiturates, Ur Screen: NOT DETECTED
Benzodiazepine, Ur Scrn: NOT DETECTED
Cannabinoid 50 Ng, Ur ~~LOC~~: POSITIVE — AB
Cocaine Metabolite,Ur ~~LOC~~: POSITIVE — AB
MDMA (Ecstasy)Ur Screen: NOT DETECTED
Methadone Scn, Ur: NOT DETECTED
Opiate, Ur Screen: NOT DETECTED
Phencyclidine (PCP) Ur S: NOT DETECTED
Tricyclic, Ur Screen: NOT DETECTED

## 2022-12-18 LAB — SALICYLATE LEVEL: Salicylate Lvl: <7 mg/dL — ABNORMAL LOW (ref 7.0–30.0)

## 2022-12-18 LAB — RESP PANEL BY RT-PCR (RSV, FLU A&B, COVID)  RVPGX2
Influenza A by PCR: NEGATIVE
Influenza B by PCR: NEGATIVE
Resp Syncytial Virus by PCR: NEGATIVE
SARS Coronavirus 2 by RT PCR: NEGATIVE

## 2022-12-18 LAB — BRAIN NATRIURETIC PEPTIDE: B Natriuretic Peptide: 101.4 pg/mL — ABNORMAL HIGH (ref 0.0–100.0)

## 2022-12-18 LAB — CBG MONITORING, ED: Glucose-Capillary: 203 mg/dL — ABNORMAL HIGH (ref 70–99)

## 2022-12-18 LAB — ETHANOL: Alcohol, Ethyl (B): <10 mg/dL (ref <10)

## 2022-12-18 LAB — TROPONIN I (HIGH SENSITIVITY): Troponin I (High Sensitivity): 7 ng/L (ref <18)

## 2022-12-18 MED ORDER — ACETAMINOPHEN 325 MG PO TABS: 650.0000 mg | ORAL_TABLET | ORAL | Status: DC | PRN

## 2022-12-18 MED ORDER — CARVEDILOL 3.125 MG PO TABS
3.1250 mg | ORAL_TABLET | Freq: Two times a day (BID) | ORAL | Status: DC
  Filled 2022-12-18: qty 1

## 2022-12-18 MED ORDER — BUPROPION HCL ER (XL) 150 MG PO TB24
150.0000 mg | ORAL_TABLET | Freq: Every day | ORAL | Status: DC
  Administered 2022-12-18: 150 mg via ORAL
  Filled 2022-12-18: qty 1

## 2022-12-18 MED ORDER — THIAMINE MONONITRATE 100 MG PO TABS
100.0000 mg | ORAL_TABLET | Freq: Every day | ORAL | Status: DC
  Administered 2022-12-18: 100 mg via ORAL
  Filled 2022-12-18: qty 1

## 2022-12-18 MED ORDER — LORAZEPAM 2 MG/ML IJ SOLN: 0.0000 mg | Freq: Two times a day (BID) | INTRAMUSCULAR | Status: DC

## 2022-12-18 MED ORDER — LORAZEPAM 2 MG PO TABS: 0.0000 mg | ORAL_TABLET | Freq: Two times a day (BID) | ORAL | Status: DC

## 2022-12-18 MED ORDER — METFORMIN HCL 500 MG PO TABS: 500.0000 mg | ORAL_TABLET | Freq: Every day | ORAL | Status: DC

## 2022-12-18 MED ORDER — LOSARTAN POTASSIUM 50 MG PO TABS
25.0000 mg | ORAL_TABLET | Freq: Every day | ORAL | Status: DC
  Administered 2022-12-18: 25 mg via ORAL
  Filled 2022-12-18: qty 1

## 2022-12-18 MED ORDER — INSULIN GLARGINE-YFGN 100 UNIT/ML ~~LOC~~ SOLN
20.0000 [IU] | Freq: Every day | SUBCUTANEOUS | Status: DC
  Filled 2022-12-18: qty 0.2

## 2022-12-18 MED ORDER — THIAMINE HCL 100 MG/ML IJ SOLN: 100.0000 mg | Freq: Every day | INTRAMUSCULAR | Status: DC

## 2022-12-18 MED ORDER — LORAZEPAM 2 MG PO TABS
0.0000 mg | ORAL_TABLET | Freq: Four times a day (QID) | ORAL | Status: DC
  Administered 2022-12-18: 1 mg via ORAL
  Filled 2022-12-18: qty 1

## 2022-12-18 MED ORDER — DULOXETINE HCL 30 MG PO CPEP
30.0000 mg | ORAL_CAPSULE | Freq: Every day | ORAL | Status: DC
  Administered 2022-12-18: 30 mg via ORAL
  Filled 2022-12-18 (×2): qty 1

## 2022-12-18 MED ORDER — CLONIDINE HCL 0.1 MG PO TABS
0.1000 mg | ORAL_TABLET | Freq: Once | ORAL | Status: AC
  Administered 2022-12-18: 0.1 mg via ORAL
  Filled 2022-12-18: qty 1

## 2022-12-18 NOTE — BH Assessment (Addendum)
Writer spoke with patient about going to [facility]. He stated he need time to think about it prior to signing the form. Writer explained, this is the only bed offer. Patient asked if he doesn't take it would he be discharged, and writer told him yes.  Writer and Psych NP followed up with patient to sign volunteer form. Explained to the patient again his treatment options. He initially said he didn't want to sign, and Probation officer explained it was his right not to go to [place], or he didn't have to sign the paper.

## 2022-12-18 NOTE — ED Notes (Signed)
Pt given dinner tray and water - asleep at this time.

## 2022-12-18 NOTE — ED Provider Notes (Signed)
[facility] Provider Note    Event Date/Time   First MD Initiated Contact with Patient 12/18/22 [phone]     (approximate)   History   Alcohol Problem (Detox) and Psychiatric Evaluation   HPI  Joshua [name] is a 52 y.o. male  here with multiple complaints. Pt states that things have been "falling apart" recently and he has essentially stopped taking all of his medications. Reports he has no desire or will to live and has had passive Si. Rpeorts he has stopped his diabetes medications, depression meds. Reports sx have been ongoing for months but worse over last few weeks. No HI. No paranoia, hallucinations. States he has been self-medication with EtOH and drugs.       Physical Exam   Triage Vital Signs: ED Triage Vitals  Enc Vitals Group     BP 12/18/22 0917 (!) 140/102     Pulse Rate 12/18/22 0917 77     Resp 12/18/22 0917 20     Temp 12/18/22 0917 97.7 F (36.5 C)     Temp src --      SpO2 12/18/22 0917 99 %     Weight 12/18/22 0918 180 lb (81.6 kg)     Height 12/18/22 0918 '5\' 10"'$  (1.778 m)     Head Circumference --      Peak Flow --      Pain Score 12/18/22 0917 0     Pain Loc --      Pain Edu? --      Excl. in [place]? --     Most recent vital signs: Vitals:   12/18/22 0917  BP: (!) 140/102  Pulse: 77  Resp: 20  Temp: 97.7 F (36.5 C)  SpO2: 99%     General: Awake, no distress.  CV:  Good peripheral perfusion. RRR. Resp:  Normal effort.  Abd:  No distention.  Other:  Withdrawn, endorses passive SI. No HI. No paranoia. No RTIS.   ED Results / Procedures / Treatments   Labs (all labs ordered are listed, but only abnormal results are displayed) Labs Reviewed  COMPREHENSIVE METABOLIC PANEL - Abnormal; Notable for the following components:      Result Value   Glucose, Bld 152 (*)    Calcium 8.8 (*)    ALT 47 (*)    All other components within normal limits  SALICYLATE LEVEL - Abnormal; Notable for the following components:    Salicylate Lvl <5.0 (*)    All other components within normal limits  ACETAMINOPHEN LEVEL - Abnormal; Notable for the following components:   Acetaminophen (Tylenol), Serum <10 (*)    All other components within normal limits  URINE DRUG SCREEN, QUALITATIVE ([facility] ONLY) - Abnormal; Notable for the following components:   Cocaine Metabolite,Ur [place] POSITIVE (*)    Cannabinoid 50 Ng, Ur [place] POSITIVE (*)    All other components within normal limits  BRAIN NATRIURETIC PEPTIDE - Abnormal; Notable for the following components:   B Natriuretic Peptide 101.4 (*)    All other components within normal limits  ETHANOL  CBC  CBG MONITORING, ED  CBG MONITORING, ED  CBG MONITORING, ED  TROPONIN I (HIGH SENSITIVITY)     EKG Normal sinus rhythm, VR 83. PR 164, QRS 72, QTc 437. No acute ST elevations or depressions. No ischemia or infarct.   RADIOLOGY  I also independently reviewed and agree with radiologist interpretations.   PROCEDURES:  Critical Care performed: No   MEDICATIONS ORDERED IN ED:  Medications  acetaminophen (TYLENOL) tablet 650 mg (has no administration in time range)  metFORMIN (GLUCOPHAGE) tablet 500 mg (has no administration in time range)  losartan (COZAAR) tablet 25 mg (has no administration in time range)  insulin glargine-yfgn (SEMGLEE) injection 20 Units (has no administration in time range)  DULoxetine (CYMBALTA) DR capsule 30 mg (has no administration in time range)  carvedilol (COREG) tablet 3.125 mg (has no administration in time range)  buPROPion (WELLBUTRIN XL) 24 hr tablet 150 mg (has no administration in time range)  LORazepam (ATIVAN) tablet 0-4 mg (has no administration in time range)  thiamine (VITAMIN B1) tablet 100 mg (has no administration in time range)    Or  thiamine (VITAMIN B1) injection 100 mg (has no administration in time range)  LORazepam (ATIVAN) injection 0-4 mg (has no administration in time range)    Or  LORazepam (ATIVAN) tablet 0-4  mg (has no administration in time range)     IMPRESSION / MDM / ASSESSMENT AND PLAN / ED COURSE  I reviewed the triage vital signs and the nursing notes.                              Differential diagnosis includes, but is not limited to, worsening depression, SI, polysubstance dependence, mood d/o from general medication condition.  Patient's presentation is most consistent with acute presentation with potential threat to life or bodily function.   52 yo M with PMHx as above here with worsening depression, substance abuse. Pt is HDS. He has chronic DM, medical issues but does not have apparent acute medical issue. No signs of DKA. Home meds reordered with insulin at half dose. Consult TTS/Psychiatry.   FINAL CLINICAL IMPRESSION(S) / ED DIAGNOSES   Final diagnoses:  Suicidal ideation  Polysubstance abuse ([place])     Rx / DC Orders   ED Discharge Orders     None        Note:  This document was prepared using Dragon voice recognition software and may include unintentional dictation errors.   Duffy Bruce, MD 12/18/22 1118

## 2022-12-18 NOTE — ED Notes (Signed)
Dark jeans 1 black hat Red sneakers Black belt Gray fleece sweatshirt 1 bookbag 1 old patient belongings bag

## 2022-12-18 NOTE — ED Notes (Addendum)
Pt discharging home. Pt advised of discharge and discharge paperwork reviewed with pt to include the medications he took while here and at what time they were given. Pt refuses to sign discharge paperwork instead asking "why are they trying to get rid of me?". Pt then tells security he would rather go to [facility]. Pt advised he is now discharged, given all his personal belongings and security called to escort pt out.

## 2022-12-18 NOTE — ED Notes (Addendum)
Pt refused to sign transport waiver, requesting discharge stating he feels bullied into going to the facility in [employer]

## 2022-12-18 NOTE — ED Notes (Signed)
Pt given snack and water at this time.

## 2022-12-18 NOTE — BH Assessment (Signed)
Comprehensive Clinical Assessment (CCA) Screening, Triage and Referral Note  12/18/2022 Joshua [name] [id number]  Chief Complaint:  Chief Complaint  Patient presents with   Alcohol Problem    Detox   Psychiatric Evaluation   Visit Diagnosis: Substance Use Disorder  Joshua [name] is a 52 year old male who presents to the ER seeking help for his substance use. patient reports SI with no plan, due to his substance use. Patient admits to the use of cocaine.  Patient Reported Information How did you hear about [place]? Self  What Is the Reason for Your Visit/Call Today? Seeking help for his substance use and depression  How Long Has This Been Causing You Problems? 1 wk - 1 month  What Do You Feel Would Help You the Most Today? Treatment for Depression or other mood problem; Alcohol or Drug Use Treatment   Have You Recently Had Any Thoughts About Hurting Yourself? Yes  Are You Planning to Commit Suicide/Harm Yourself At This time? No   Have you Recently Had Thoughts About [place]? No  Are You Planning to Harm Someone at This Time? No  Explanation: No data recorded  Have You Used Any Alcohol or Drugs in the Past 24 Hours? Yes  How Long Ago Did You Use Drugs or Alcohol? No data recorded What Did You Use and How Much? Cocaine   Do You Currently Have a Therapist/Psychiatrist? No  Name of Therapist/Psychiatrist: No data recorded  Have You Been Recently Discharged From Any Office Practice or Programs? No  Explanation of Discharge From Practice/Program: No data recorded   CCA Screening Triage Referral Assessment Type of Contact: Face-to-Face  Telemedicine Service Delivery:   Is this Initial or Reassessment?   Date Telepsych consult ordered in [facility]:    Time Telepsych consult ordered in [facility]:    Location of Assessment: [facility] ED  Provider Location: [facility] ED   Collateral Involvement: No data recorded  Does Patient Have a [place]? No  data recorded Name and Contact of Legal Guardian: No data recorded If Minor and Not Living with Parent(s), Who has Custody? No data recorded Is CPS involved or ever been involved? Never  Is APS involved or ever been involved? Never   Patient Determined To Be At Risk for Harm To Self or Others Based on Review of Patient Reported Information or Presenting Complaint? No  Method: No data recorded Availability of Means: No data recorded Intent: No data recorded Notification Required: No data recorded Additional Information for Danger to Others Potential: No data recorded Additional Comments for Danger to Others Potential: No data recorded Are There Guns or Other Weapons in Your Home? No data recorded Types of Guns/Weapons: No data recorded Are These Weapons Safely Secured?                            No data recorded Who Could Verify You Are Able To Have These Secured: No data recorded Do You Have any Outstanding Charges, Pending Court Dates, Parole/Probation? No data recorded Contacted To Inform of Risk of Harm To Self or Others: No data recorded  Does Patient Present under Involuntary Commitment? No   County of Residence: [place]   Patient Currently Receiving the Following Services: Not Receiving Services   Determination of Need: Emergent (2 hours)   Options For Referral: ED Visit   Discharge Disposition:    Gunnar Fusi MS, LCAS, [facility], [facility] Therapeutic Triage Specialist 12/18/2022 2:40 PM

## 2022-12-18 NOTE — ED Triage Notes (Addendum)
Pt comes in for detox and getting his medication "sorted out". Pt was here earlier today for same thing. Was given discharge instructions to follow up with [facility] but pt states he wants to go somewhere today. Pt in NAD , denies CP, SOB  Pt endorses SI and HI. States having these thoughts today, thinking about walking in front of traffic

## 2022-12-18 NOTE — BH Assessment (Signed)
Signed Voluntary consent for [facility] signed by patient and placed on paper chart.

## 2022-12-18 NOTE — Consult Note (Signed)
[facility] Face-to-Face Psychiatry Consult   Reason for Consult:  substance abuse requesting rehab Referring Physician:  EDP Patient Identification: Joshua [name] MRN:  [id number] Principal Diagnosis: Cocaine abuse with cocaine-induced mood disorder ([place]) Diagnosis:  Principal Problem:   Cocaine abuse with cocaine-induced mood disorder ([place])   Total Time spent with patient: 45 minutes  Subjective:   [name] is a 52 y.o. male patient admitted with substance abuse.  HPI:  52 yo male presented to the ED requesting rehab for all drugs, he states using meth, cocaine, alcohol, etc.  He denies suicidal ideations and conversing with smiling with the police officer prior to assessment while eating heartily.  No psychosis, homicidal ideations, or withdrawal symptoms.  He has never had rehab before and motivated because of his health conditions, heart issues, and the need to stop.    Past Psychiatric History: none  Risk to Self:  none Risk to Others:  none Prior Inpatient Therapy:  none Prior Outpatient Therapy:  none  Past Medical History:  Past Medical History:  Diagnosis Date   Abrasion of toe with infection, right, subsequent encounter 03/27/2021   CHF (congestive heart failure) ([place])    Diabetes mellitus without complication ([place])    Hypertension     Past Surgical History:  Procedure Laterality Date   AMPUTATION TOE Right 04/01/2021   Procedure: AMPUTATION TOE;  Surgeon: Sharlotte Alamo, DPM;  Location: [facility] ORS;  Service: Podiatry;  Laterality: Right;  RIGHT GREAT TOE   CORONARY/GRAFT ACUTE MI REVASCULARIZATION N/A 09/09/2018   Procedure: Coronary/Graft Acute MI Revascularization;  Surgeon: Yolonda Kida, MD;  Location: [place] CV LAB;  Service: Cardiovascular;  Laterality: N/A;   LEFT HEART CATH AND CORONARY ANGIOGRAPHY N/A 09/09/2018   Procedure: LEFT HEART CATH AND CORONARY ANGIOGRAPHY;  Surgeon: Yolonda Kida, MD;  Location: [place] CV LAB;   Service: Cardiovascular;  Laterality: N/A;   LOWER EXTREMITY ANGIOGRAPHY Right 02/14/2021   Procedure: Lower Extremity Angiography;  Surgeon: Katha Cabal, MD;  Location: [place] CV LAB;  Service: Cardiovascular;  Laterality: Right;   none     STENT PLACEMENT VASCULAR ([facility] HX)     Family History:  Family History  Problem Relation Age of Onset   Cancer Mother    CAD Brother    Family Psychiatric  History: none Social History:  Social History   Substance and Sexual Activity  Alcohol Use Yes   Alcohol/week: 0.0 standard drinks of alcohol     Social History   Substance and Sexual Activity  Drug Use Not Currently   Comment: pt states no found unknown white substance in mouth    Social History   Socioeconomic History   Marital status: Single    Spouse name: Not on file   Number of children: Not on file   Years of education: Not on file   Highest education level: Not on file  Occupational History   Not on file  Tobacco Use   Smoking status: Every Day   Smokeless tobacco: Never  Vaping Use   Vaping Use: Never used  Substance and Sexual Activity   Alcohol use: Yes    Alcohol/week: 0.0 standard drinks of alcohol   Drug use: Not Currently    Comment: pt states no found unknown white substance in mouth   Sexual activity: Not on file  Other Topics Concern   Not on file  Social History Narrative   Not on file   Social Determinants of Health  Financial Resource Strain: Not on file  Food Insecurity: Not on file  Transportation Needs: Not on file  Physical Activity: Not on file  Stress: Not on file  Social Connections: Not on file   Additional Social History:    Allergies:   Allergies  Allergen Reactions   Penicillins Other (See Comments)    Patient unsure of allergy    Labs:  Results for orders placed or performed during the hospital encounter of 12/18/22 (from the past 48 hour(s))  Comprehensive metabolic panel     Status: Abnormal   Collection  Time: 12/18/22  9:27 AM  Result Value Ref Range   Sodium 138 135 - 145 mmol/L   Potassium 4.5 3.5 - 5.1 mmol/L   Chloride 107 98 - 111 mmol/L   CO2 26 22 - 32 mmol/L   Glucose, Bld 152 (H) 70 - 99 mg/dL    Comment: Glucose reference range applies only to samples taken after fasting for at least 8 hours.   BUN 13 6 - 20 mg/dL   Creatinine, Ser 1.01 0.61 - 1.24 mg/dL   Calcium 8.8 (L) 8.9 - 10.3 mg/dL   Total Protein 7.3 6.5 - 8.1 g/dL   Albumin 3.9 3.5 - 5.0 g/dL   AST 41 15 - 41 U/L   ALT 47 (H) 0 - 44 U/L   Alkaline Phosphatase 89 38 - 126 U/L   Total Bilirubin 0.5 0.3 - 1.2 mg/dL   GFR, Estimated >60 >60 mL/min    Comment: (NOTE) Calculated using the CKD-EPI Creatinine Equation (2021)    Anion gap 5 5 - 15    Comment: Performed at [facility], [address] [place], [place] [address]  Ethanol     Status: None   Collection Time: 12/18/22  9:27 AM  Result Value Ref Range   Alcohol, Ethyl (B) <10 <10 mg/dL    Comment: (NOTE) Lowest detectable limit for serum alcohol is 10 mg/dL.  For medical purposes only. Performed at [facility], [address] [place], [place] [address]   Salicylate level     Status: Abnormal   Collection Time: 12/18/22  9:27 AM  Result Value Ref Range   Salicylate Lvl <6.9 (L) 7.0 - 30.0 mg/dL    Comment: Performed at [facility], [address] [place], [place] [address]  Acetaminophen level     Status: Abnormal   Collection Time: 12/18/22  9:27 AM  Result Value Ref Range   Acetaminophen (Tylenol), Serum <10 (L) 10 - 30 ug/mL    Comment: (NOTE) Therapeutic concentrations vary significantly. A range of 10-30 ug/mL  may be an effective concentration for many patients. However, some  are best treated at concentrations outside of this range. Acetaminophen concentrations >150 ug/mL at 4 hours after ingestion  and >50 ug/mL at 12 hours after ingestion are often associated with  toxic reactions.  Performed at  [facility], [address] [place], [place] [address]   cbc     Status: None   Collection Time: 12/18/22  9:27 AM  Result Value Ref Range   WBC 5.6 4.0 - 10.5 K/uL   RBC 5.07 4.22 - 5.81 MIL/uL   Hemoglobin 15.1 13.0 - 17.0 g/dL   HCT 48.5 39.0 - 52.0 %   MCV 95.7 80.0 - 100.0 fL   MCH 29.8 26.0 - 34.0 pg   MCHC 31.1 30.0 - 36.0 g/dL   RDW 13.5 11.5 - 15.5 %   Platelets 242 150 -  400 K/uL   nRBC 0.0 0.0 - 0.2 %    Comment: Performed at [facility], [address] [place], [place] [address]  Urine Drug Screen, Qualitative     Status: Abnormal   Collection Time: 12/18/22  9:27 AM  Result Value Ref Range   Tricyclic, Ur Screen NONE DETECTED NONE DETECTED   Amphetamines, Ur Screen NONE DETECTED NONE DETECTED   MDMA (Ecstasy)Ur Screen NONE DETECTED NONE DETECTED   Cocaine Metabolite,Ur [place] POSITIVE (A) NONE DETECTED   Opiate, Ur Screen NONE DETECTED NONE DETECTED   Phencyclidine (PCP) Ur S NONE DETECTED NONE DETECTED   Cannabinoid 50 Ng, Ur [place] POSITIVE (A) NONE DETECTED   Barbiturates, Ur Screen NONE DETECTED NONE DETECTED   Benzodiazepine, Ur Scrn NONE DETECTED NONE DETECTED   Methadone Scn, Ur NONE DETECTED NONE DETECTED    Comment: (NOTE) Tricyclics + metabolites, urine    Cutoff 1000 ng/mL Amphetamines + metabolites, urine  Cutoff 1000 ng/mL MDMA (Ecstasy), urine              Cutoff 500 ng/mL Cocaine Metabolite, urine          Cutoff 300 ng/mL Opiate + metabolites, urine        Cutoff 300 ng/mL Phencyclidine (PCP), urine         Cutoff 25 ng/mL Cannabinoid, urine                 Cutoff 50 ng/mL Barbiturates + metabolites, urine  Cutoff 200 ng/mL Benzodiazepine, urine              Cutoff 200 ng/mL Methadone, urine                   Cutoff 300 ng/mL  The urine drug screen provides only a preliminary, unconfirmed analytical test result and should not be used for non-medical purposes. Clinical consideration and professional judgment should be applied to  any positive drug screen result due to possible interfering substances. A more specific alternate chemical method must be used in order to obtain a confirmed analytical result. Gas chromatography / mass spectrometry (GC/MS) is the preferred confirm atory method. Performed at [facility], [address] [place], [place] [address]     No current facility-administered medications for this encounter.   Current Outpatient Medications  Medication Sig Dispense Refill   ascorbic acid (VITAMIN C) 250 MG tablet Take 1 tablet (250 mg total) by mouth 2 (two) times daily. 60 tablet 1   buPROPion (WELLBUTRIN XL) 150 MG 24 hr tablet Take 150 mg by mouth daily as needed.     carvedilol (COREG) 3.125 MG tablet Take 1 tablet (3.125 mg total) by mouth 2 (two) times daily with a meal. 60 tablet 1   clindamycin (CLEOCIN) 300 MG capsule Take 1 capsule (300 mg total) by mouth 3 (three) times daily. 30 capsule 0   DULoxetine (CYMBALTA) 30 MG capsule Take 30 mg by mouth daily.     furosemide (LASIX) 20 MG tablet Take 1 tablet (20 mg total) by mouth daily as needed (for weight gain >3lbs in 1 days and >5lbs in 2 days). 30 tablet 1   insulin glargine (LANTUS) 100 UNIT/ML Solostar Pen Inject 40 Units into the skin daily. 15 mL 11   Insulin Pen Needle (PEN NEEDLES) 31G X 5 MM MISC 40 Units by Does not apply route at bedtime. 100 each 1   losartan (COZAAR) 25 MG tablet Take 1 tablet (25 mg total) by mouth daily. [place]  tablet 1   metFORMIN (GLUCOPHAGE) 500 MG tablet Take 1 tablet (500 mg total) by mouth daily with breakfast. 30 tablet 1   Multiple Vitamin (MULTIVITAMIN WITH MINERALS) TABS tablet Take 1 tablet by mouth daily. 90 tablet 1   polyethylene glycol powder (GLYCOLAX/MIRALAX) 17 GM/SCOOP powder Take 17 g by mouth daily.     spironolactone (ALDACTONE) 25 MG tablet Take 1 tablet (25 mg total) by mouth daily. 30 tablet 1    Musculoskeletal: Strength & Muscle Tone: within normal limits Gait & Station:  normal Patient leans: N/A  Psychiatric Specialty Exam: Physical Exam Vitals and nursing note reviewed.  Constitutional:      Appearance: Normal appearance.  HENT:     Head: Normocephalic.     Nose: Nose normal.  Pulmonary:     Effort: Pulmonary effort is normal.  Musculoskeletal:        General: Normal range of motion.     Cervical back: Normal range of motion.  Neurological:     General: No focal deficit present.     Mental Status: He is alert and oriented to person, place, and time.  Psychiatric:        Attention and Perception: Attention and perception normal.        Mood and Affect: Mood is depressed.        Speech: Speech normal.        Behavior: Behavior normal. Behavior is cooperative.        Thought Content: Thought content normal.        Cognition and Memory: Cognition and memory normal.        Judgment: Judgment normal.     Review of Systems  Psychiatric/Behavioral:  Positive for depression and suicidal ideas.   All other systems reviewed and are negative.   Blood pressure (!) 140/102, pulse 77, temperature 97.7 F (36.5 C), resp. rate 20, height '5\' 10"'$  (1.778 m), weight 81.6 kg, SpO2 99 %.Body mass index is 25.83 kg/m.  General Appearance: Casual  Eye Contact:  Good  Speech:  Normal Rate  Volume:  Normal  Mood:  Anxious  Affect:  Congruent  Thought Process:  Coherent  Orientation:  Full (Time, Place, and Person)  Thought Content:  WDL and Logical  Suicidal Thoughts:  No  Homicidal Thoughts:  No  Memory:  Immediate;   Good Recent;   Good Remote;   Good  Judgement:  Fair  Insight:  Fair  Psychomotor Activity:  Normal  Concentration:  Concentration: Fair and Attention Span: Fair  Recall:  [employer] of Knowledge:  Fair  Language:  Good  Akathisia:  No  Handed:  Right  AIMS (if indicated):     Assets:  Le[place] Time Physical Health Resilience  ADL's:  Intact  Cognition:  WNL  Sleep:        Physical Exam: Physical Exam Vitals and nursing note  reviewed.  Constitutional:      Appearance: Normal appearance.  HENT:     Head: Normocephalic.     Nose: Nose normal.  Pulmonary:     Effort: Pulmonary effort is normal.  Musculoskeletal:        General: Normal range of motion.     Cervical back: Normal range of motion.  Neurological:     General: No focal deficit present.     Mental Status: He is alert and oriented to person, place, and time.  Psychiatric:        Attention and Perception: Attention and perception normal.  Mood and Affect: Mood is depressed.        Speech: Speech normal.        Behavior: Behavior normal. Behavior is cooperative.        Thought Content: Thought content normal.        Cognition and Memory: Cognition and memory normal.        Judgment: Judgment normal.    Review of Systems  Psychiatric/Behavioral:  Positive for depression and suicidal ideas.   All other systems reviewed and are negative.  Blood pressure (!) 140/102, pulse 77, temperature 97.7 F (36.5 C), resp. rate 20, height '5\' 10"'$  (1.778 m), weight 81.6 kg, SpO2 99 %. Body mass index is 25.83 kg/m.  Treatment Plan Summary: Cocaine abuse with cocaine induced mood disorder: [place] recommendation  Disposition: No evidence of imminent risk to self or others at present.   Patient does not meet criteria for psychiatric inpatient admission.  Waylan Boga, NP 12/18/2022 10:38 AM

## 2022-12-18 NOTE — ED Notes (Signed)
Personal belongings:  1 black belt 1 pair jeans 1 pair blue/black pants, 1 pair white socks 1 pair black underwear 1 pair black/red shoes. 1 grey jacket 1 blue jacket 1 yellow necklace 1 bracelet with yellow Cigarettes 1 black bag filled with clothes, papers, drinks, food.

## 2022-12-18 NOTE — ED Notes (Signed)
Pt denies SI/AVH on assessment. Reports feeling depressed with being outside in the cold temperatures. Reports HI but to no one in particular stating " I just look at people and feel like hitting them sometimes". Pt very vague with answers to questions and started saying that he came here to get his heart, stomach and feet checked.

## 2022-12-18 NOTE — BH Assessment (Signed)
Referral information for Psychiatric Hospitalization faxed to;   [facility] [phone])  [facility] [phone]  [phone],  [phone] center/detox facility)   [facility]([phone]  [address]([phone],   [phone]- [phone],   [facility]([phone]  [facility]el([phone].  [facility]([phone]

## 2022-12-18 NOTE — ED Notes (Signed)
Report given to Tara, RN.

## 2022-12-18 NOTE — ED Triage Notes (Signed)
Pt states coming in for a detox program. Pt states he wants a program for "everything."  Pt is not forthcoming with information in triage.  Pt states SI without a specific plan.

## 2022-12-19 ENCOUNTER — Emergency Department
Admission: EM | Admit: 2022-12-19 | Discharge: 2022-12-19 | Disposition: A | Discharge status: Home / Self Care | Payer: BLUE CROSS/BLUE SHIELD | Source: Home / Self Care | Attending: Emergency Medicine | Admitting: Emergency Medicine

## 2022-12-19 DIAGNOSIS — F1910 Other psychoactive substance abuse, uncomplicated: Secondary | ICD-10-CM

## 2022-12-19 DIAGNOSIS — F1994 Other psychoactive substance use, unspecified with psychoactive substance-induced mood disorder: Secondary | ICD-10-CM

## 2022-12-19 NOTE — ED Notes (Signed)
ED Provider at bedside. 

## 2022-12-19 NOTE — ED Provider Notes (Signed)
[facility] Provider Note    Event Date/Time   First MD Initiated Contact with Patient 12/19/22 [phone]     (approximate)   History   Mental Health Problem   HPI  Joshua [name] is a 52 y.o. male who was just seen within the last 24 hours in the emergency department for polysubstance abuse.  He presents again tonight for detox.  He said that he needs to get his medical situation better and he needs treatment.  He admits to polysubstance abuse.  He initially told triage that he was having thoughts of killing himself but he did not mention any of that to me.  He states that he needs help with his drug addiction.  He says he has not been taking his medication for his medical problems for a few weeks, but then he said he actually has both metformin and losartan and has been taking those.  He said that his feet have blisters and he is afraid he is going to lose some more toes.     Physical Exam   Triage Vital Signs: ED Triage Vitals  Enc Vitals Group     BP 12/19/22 0014 111/77     Pulse Rate 12/18/22 2116 76     Resp 12/18/22 2116 16     Temp 12/18/22 2116 98 F (36.7 C)     Temp Source 12/18/22 2116 Oral     SpO2 12/18/22 2116 100 %     Weight 12/18/22 2108 81.6 kg (180 lb)     Height 12/18/22 2108 1.778 m ('5\' 10"'$ )     Head Circumference --      Peak Flow --      Pain Score 12/18/22 2108 0     Pain Loc --      Pain Edu? --      Excl. in [place]? --     Most recent vital signs: Vitals:   12/18/22 2116 12/19/22 0014  BP:  111/77  Pulse: 76 72  Resp: 16 18  Temp: 98 F (36.7 C) 98 F (36.7 C)  SpO2: 100% 100%     General: Awake, no distress.  CV:  Good peripheral perfusion.  Resp:  Normal effort.  Abd:  No distention.  Other:  Patient has chronic skin thickening and some evidence of mild athlete's foot between his toes.  He has a chronic area of induration on the lateral aspect of the left fifth metatarsal but it is not a wound and it is  not infected.  He has no blisters and no evidence of cellulitis, pressure ulcers, etc.  He is ambulatory without difficulty.  He is not expressing SI or HI.  He is awake, alert, and calm at this time.   ED Results / Procedures / Treatments   Labs (all labs ordered are listed, but only abnormal results are displayed) Labs Reviewed - No data to display     PROCEDURES:  Critical Care performed: No  Procedures   MEDICATIONS ORDERED IN ED: Medications - No data to display   IMPRESSION / MDM / [place] / ED COURSE  I reviewed the triage vital signs and the nursing notes.                              Differential diagnosis includes, but is not limited to, substance abuse, malingering, secondary gain.  Patient's presentation is most consistent with exacerbation of chronic  illness.  I reviewed all of the lab tests that were obtained within the last 24 hours.  His blood work is very reassuring.  In spite of his diabetes and apparently not taking his insulin, his blood glucose was less than 160.  His physical exam is also reassuring with no evidence of cellulitis on his feet or a chronic or purulent wound.  I reviewed the notes from the last visit including the psych notes and TTS notes.  He does not meet involuntary commitment criteria nor inpatient treatment criteria.  Apparently TTS worked with him and tried to send him to the [facility], but after changing his mind multiple times about whether or not to consent to transport, he eventually refused and left on his own.  He said he did not want to go to [facility].  I suspect that there is an element of manipulation and malingering given that he came back so quickly and that the weather is very unpleasant and cold/rainy tonight.  It is well-documented that he would agree to transport, it would get arranged with law enforcement, then he would refuse.  As per the current [place] detox guidelines, I explained to the  patient that we do not detox in the hospital, and that we do not place people in facilities.  I made available to him the community resource guides for both outpatient and residential treatments.  I also gave him a bus pass and gave him additional information about finding assistance, but I explained that we would not be keeping him nor would we be able to place him.  I had spoken to the [facility] representative earlier this shift about another patient and I know that they do not have capacity right now, additionally transportation is an issue.  He is displeased with the situation but will except the outpatient resources.        FINAL CLINICAL IMPRESSION(S) / ED DIAGNOSES   Final diagnoses:  Substance induced mood disorder ([place])  Polysubstance abuse ([place])     Rx / DC Orders   ED Discharge Orders     None        Note:  This document was prepared using Dragon voice recognition software and may include unintentional dictation errors.   Hinda Kehr, MD 12/19/22 [phone]

## 2022-12-19 NOTE — Discharge Instructions (Addendum)
You have been seen in the Emergency Department (ED) today for substance abuse.  You have been evaluated by the ED physician(s) and/or by the behavioral medicine specialists and are being discharged with outpatient resources and follow up recommendations.  We do not detox at [facility], but you have been given a number of places with whom you can follow up.  Follow up with your doctor and/or therapist as soon as possible regarding today's ED  visit.   Please follow up any other recommendations and clinic appointments provided by the psychiatry team that saw you in the Emergency Department.

## 2022-12-19 NOTE — ED Notes (Signed)
Patient resting quietly with eyes closed.  Respirations even and unlabored.  °

## 2023-09-11 ENCOUNTER — Other Ambulatory Visit: Payer: Self-pay

## 2023-09-11 ENCOUNTER — Emergency Department: Payer: 59

## 2023-09-11 ENCOUNTER — Inpatient Hospital Stay
Admission: EM | Admit: 2023-09-11 | Discharge: 2023-09-14 | DRG: 981 | Disposition: A | Discharge status: Home / Self Care | Payer: 59 | Attending: Student | Admitting: Student

## 2023-09-11 DIAGNOSIS — I252 Old myocardial infarction: Secondary | ICD-10-CM | POA: Diagnosis not present

## 2023-09-11 DIAGNOSIS — G9341 Metabolic encephalopathy: Secondary | ICD-10-CM | POA: Diagnosis present

## 2023-09-11 DIAGNOSIS — Z794 Long term (current) use of insulin: Secondary | ICD-10-CM | POA: Diagnosis not present

## 2023-09-11 DIAGNOSIS — Z85038 Personal history of other malignant neoplasm of large intestine: Secondary | ICD-10-CM

## 2023-09-11 DIAGNOSIS — J9601 Acute respiratory failure with hypoxia: Secondary | ICD-10-CM | POA: Diagnosis present

## 2023-09-11 DIAGNOSIS — Z5982 Transportation insecurity: Secondary | ICD-10-CM

## 2023-09-11 DIAGNOSIS — Z89421 Acquired absence of other right toe(s): Secondary | ICD-10-CM

## 2023-09-11 DIAGNOSIS — F1410 Cocaine abuse, uncomplicated: Secondary | ICD-10-CM | POA: Diagnosis present

## 2023-09-11 DIAGNOSIS — Z9049 Acquired absence of other specified parts of digestive tract: Secondary | ICD-10-CM | POA: Diagnosis not present

## 2023-09-11 DIAGNOSIS — Z955 Presence of coronary angioplasty implant and graft: Secondary | ICD-10-CM | POA: Diagnosis not present

## 2023-09-11 DIAGNOSIS — Z8249 Family history of ischemic heart disease and other diseases of the circulatory system: Secondary | ICD-10-CM | POA: Diagnosis not present

## 2023-09-11 DIAGNOSIS — I110 Hypertensive heart disease with heart failure: Secondary | ICD-10-CM | POA: Diagnosis present

## 2023-09-11 DIAGNOSIS — M799 Soft tissue disorder, unspecified: Secondary | ICD-10-CM | POA: Insufficient documentation

## 2023-09-11 DIAGNOSIS — E871 Hypo-osmolality and hyponatremia: Secondary | ICD-10-CM | POA: Insufficient documentation

## 2023-09-11 DIAGNOSIS — I255 Ischemic cardiomyopathy: Secondary | ICD-10-CM | POA: Diagnosis present

## 2023-09-11 DIAGNOSIS — I2510 Atherosclerotic heart disease of native coronary artery without angina pectoris: Secondary | ICD-10-CM | POA: Diagnosis present

## 2023-09-11 DIAGNOSIS — F17200 Nicotine dependence, unspecified, uncomplicated: Secondary | ICD-10-CM | POA: Diagnosis present

## 2023-09-11 DIAGNOSIS — Z5941 Food insecurity: Secondary | ICD-10-CM

## 2023-09-11 DIAGNOSIS — E1165 Type 2 diabetes mellitus with hyperglycemia: Secondary | ICD-10-CM | POA: Diagnosis present

## 2023-09-11 DIAGNOSIS — R000 Tachycardia, unspecified: Secondary | ICD-10-CM | POA: Diagnosis present

## 2023-09-11 DIAGNOSIS — I214 Non-ST elevation (NSTEMI) myocardial infarction: Principal | ICD-10-CM | POA: Diagnosis present

## 2023-09-11 DIAGNOSIS — T405X1A Poisoning by cocaine, accidental (unintentional), initial encounter: Principal | ICD-10-CM | POA: Diagnosis present

## 2023-09-11 DIAGNOSIS — Z5986 Financial insecurity: Secondary | ICD-10-CM

## 2023-09-11 DIAGNOSIS — I10 Essential (primary) hypertension: Secondary | ICD-10-CM | POA: Diagnosis present

## 2023-09-11 DIAGNOSIS — I21A1 Myocardial infarction type 2: Secondary | ICD-10-CM | POA: Diagnosis present

## 2023-09-11 DIAGNOSIS — Z79899 Other long term (current) drug therapy: Secondary | ICD-10-CM | POA: Diagnosis not present

## 2023-09-11 DIAGNOSIS — K649 Unspecified hemorrhoids: Secondary | ICD-10-CM | POA: Diagnosis present

## 2023-09-11 DIAGNOSIS — Z7984 Long term (current) use of oral hypoglycemic drugs: Secondary | ICD-10-CM

## 2023-09-11 DIAGNOSIS — I5043 Acute on chronic combined systolic (congestive) and diastolic (congestive) heart failure: Secondary | ICD-10-CM | POA: Diagnosis present

## 2023-09-11 DIAGNOSIS — E119 Type 2 diabetes mellitus without complications: Secondary | ICD-10-CM

## 2023-09-11 DIAGNOSIS — E785 Hyperlipidemia, unspecified: Secondary | ICD-10-CM | POA: Diagnosis present

## 2023-09-11 DIAGNOSIS — N486 Induration penis plastica: Secondary | ICD-10-CM | POA: Diagnosis present

## 2023-09-11 DIAGNOSIS — Z880 Allergy status to penicillin: Secondary | ICD-10-CM

## 2023-09-11 DIAGNOSIS — Z91148 Patient's other noncompliance with medication regimen for other reason: Secondary | ICD-10-CM | POA: Diagnosis not present

## 2023-09-11 DIAGNOSIS — R079 Chest pain, unspecified: Secondary | ICD-10-CM | POA: Diagnosis present

## 2023-09-11 LAB — BLOOD GAS, VENOUS
Acid-base deficit: 0.7 mmol/L (ref 0.0–2.0)
Bicarbonate: 25.8 mmol/L (ref 20.0–28.0)
O2 Saturation: 48.3 %
Patient temperature: 37
pCO2, Ven: 49 mm[Hg] (ref 44–60)
pH, Ven: 7.33 (ref 7.25–7.43)
pO2, Ven: 32 mm[Hg] (ref 32–45)

## 2023-09-11 LAB — CBC WITH DIFFERENTIAL/PLATELET
Abs Immature Granulocytes: 0.02 10*3/uL (ref 0.00–0.07)
Basophils Absolute: 0 10*3/uL (ref 0.0–0.1)
Basophils Relative: 1 %
Eosinophils Absolute: 0.1 10*3/uL (ref 0.0–0.5)
Eosinophils Relative: 1 %
HCT: 46 % (ref 39.0–52.0)
Hemoglobin: 14.8 g/dL (ref 13.0–17.0)
Immature Granulocytes: 0 %
Lymphocytes Relative: 22 %
Lymphs Abs: 1.8 10*3/uL (ref 0.7–4.0)
MCH: 29.5 pg (ref 26.0–34.0)
MCHC: 32.2 g/dL (ref 30.0–36.0)
MCV: 91.8 fL (ref 80.0–100.0)
Monocytes Absolute: 0.5 10*3/uL (ref 0.1–1.0)
Monocytes Relative: 6 %
Neutro Abs: 5.9 10*3/uL (ref 1.7–7.7)
Neutrophils Relative %: 70 %
Platelets: 267 10*3/uL (ref 150–400)
RBC: 5.01 MIL/uL (ref 4.22–5.81)
RDW: 14.1 % (ref 11.5–15.5)
WBC: 8.2 10*3/uL (ref 4.0–10.5)
nRBC: 0 % (ref 0.0–0.2)

## 2023-09-11 LAB — URINALYSIS, W/ REFLEX TO CULTURE (INFECTION SUSPECTED)
Bacteria, UA: NONE SEEN
Bilirubin Urine: NEGATIVE
Glucose, UA: 150 mg/dL — AB
Ketones, ur: NEGATIVE mg/dL
Leukocytes,Ua: NEGATIVE
Nitrite: NEGATIVE
Protein, ur: NEGATIVE mg/dL
Specific Gravity, Urine: 1.005 (ref 1.005–1.030)
Squamous Epithelial / HPF: 0 /[HPF] (ref 0–5)
pH: 5 (ref 5.0–8.0)

## 2023-09-11 LAB — LIPASE, BLOOD: Lipase: 20 U/L (ref 11–51)

## 2023-09-11 LAB — COMPREHENSIVE METABOLIC PANEL
ALT: 22 U/L (ref 0–44)
ALT: 23 U/L (ref 0–44)
AST: 33 U/L (ref 15–41)
AST: 61 U/L — ABNORMAL HIGH (ref 15–41)
Albumin: 4 g/dL (ref 3.5–5.0)
Albumin: 4.1 g/dL (ref 3.5–5.0)
Alkaline Phosphatase: 84 U/L (ref 38–126)
Alkaline Phosphatase: 86 U/L (ref 38–126)
Anion gap: 9 (ref 5–15)
Anion gap: 11 (ref 5–15)
BUN: 16 mg/dL (ref 6–20)
BUN: 14 mg/dL (ref 6–20)
CO2: 22 mmol/L (ref 22–32)
CO2: 23 mmol/L (ref 22–32)
Calcium: 8.5 mg/dL — ABNORMAL LOW (ref 8.9–10.3)
Calcium: 8.9 mg/dL (ref 8.9–10.3)
Chloride: 102 mmol/L (ref 98–111)
Chloride: 100 mmol/L (ref 98–111)
Creatinine, Ser: 1.07 mg/dL (ref 0.61–1.24)
Creatinine, Ser: 1.01 mg/dL (ref 0.61–1.24)
GFR, Estimated: >60 mL/min (ref >60)
GFR, Estimated: >60 mL/min (ref >60)
Glucose, Bld: 269 mg/dL — ABNORMAL HIGH (ref 70–99)
Glucose, Bld: 292 mg/dL — ABNORMAL HIGH (ref 70–99)
Potassium: 4.5 mmol/L (ref 3.5–5.1)
Potassium: 4.9 mmol/L (ref 3.5–5.1)
Sodium: 133 mmol/L — ABNORMAL LOW (ref 135–145)
Sodium: 134 mmol/L — ABNORMAL LOW (ref 135–145)
Total Bilirubin: 1.3 mg/dL — ABNORMAL HIGH (ref 0.3–1.2)
Total Bilirubin: 1.2 mg/dL (ref 0.3–1.2)
Total Protein: 7.6 g/dL (ref 6.5–8.1)
Total Protein: 7.7 g/dL (ref 6.5–8.1)

## 2023-09-11 LAB — URINE DRUG SCREEN, QUALITATIVE (ARMC ONLY)
Amphetamines, Ur Screen: NOT DETECTED
Barbiturates, Ur Screen: NOT DETECTED
Benzodiazepine, Ur Scrn: NOT DETECTED
Cannabinoid 50 Ng, Ur ~~LOC~~: NOT DETECTED
Cocaine Metabolite,Ur ~~LOC~~: POSITIVE — AB
MDMA (Ecstasy)Ur Screen: NOT DETECTED
Methadone Scn, Ur: NOT DETECTED
Opiate, Ur Screen: NOT DETECTED
Phencyclidine (PCP) Ur S: NOT DETECTED
Tricyclic, Ur Screen: NOT DETECTED

## 2023-09-11 LAB — CBC
HCT: 47.9 % (ref 39.0–52.0)
Hemoglobin: 15.3 g/dL (ref 13.0–17.0)
MCH: 29.6 pg (ref 26.0–34.0)
MCHC: 31.9 g/dL (ref 30.0–36.0)
MCV: 92.6 fL (ref 80.0–100.0)
Platelets: 284 10*3/uL (ref 150–400)
RBC: 5.17 MIL/uL (ref 4.22–5.81)
RDW: 13.8 % (ref 11.5–15.5)
WBC: 8.1 10*3/uL (ref 4.0–10.5)
nRBC: 0 % (ref 0.0–0.2)

## 2023-09-11 LAB — TROPONIN I (HIGH SENSITIVITY)
Troponin I (High Sensitivity): 276 ng/L (ref <18)
Troponin I (High Sensitivity): 2400 ng/L (ref <18)

## 2023-09-11 LAB — APTT: aPTT: 28 s (ref 24–36)

## 2023-09-11 LAB — PROTIME-INR
INR: 1.2 (ref 0.8–1.2)
Prothrombin Time: 15.6 s — ABNORMAL HIGH (ref 11.4–15.2)

## 2023-09-11 LAB — ETHANOL: Alcohol, Ethyl (B): <10 mg/dL (ref <10)

## 2023-09-11 LAB — CBG MONITORING, ED
Glucose-Capillary: 234 mg/dL — ABNORMAL HIGH (ref 70–99)
Glucose-Capillary: 262 mg/dL — ABNORMAL HIGH (ref 70–99)

## 2023-09-11 LAB — SALICYLATE LEVEL: Salicylate Lvl: <7 mg/dL — ABNORMAL LOW (ref 7.0–30.0)

## 2023-09-11 LAB — CK: Total CK: 601 U/L — ABNORMAL HIGH (ref 49–397)

## 2023-09-11 LAB — ACETAMINOPHEN LEVEL: Acetaminophen (Tylenol), Serum: <10 ug/mL — ABNORMAL LOW (ref 10–30)

## 2023-09-11 LAB — BRAIN NATRIURETIC PEPTIDE: B Natriuretic Peptide: 1090.4 pg/mL — ABNORMAL HIGH (ref 0.0–100.0)

## 2023-09-11 MED ORDER — DIAZEPAM 5 MG/ML IJ SOLN
5.0000 mg | Freq: Once | INTRAMUSCULAR | Status: DC
  Filled 2023-09-11: qty 2

## 2023-09-11 MED ORDER — ASPIRIN 81 MG PO CHEW: 324.0000 mg | CHEWABLE_TABLET | Freq: Once | ORAL | Status: DC

## 2023-09-11 MED ORDER — FUROSEMIDE 10 MG/ML IJ SOLN
40.0000 mg | Freq: Two times a day (BID) | INTRAMUSCULAR | Status: DC
  Administered 2023-09-11 – 2023-09-13 (×4): 40 mg via INTRAVENOUS
  Filled 2023-09-11 (×4): qty 4

## 2023-09-11 MED ORDER — NALOXONE HCL 2 MG/2ML IJ SOSY
1.0000 mg | PREFILLED_SYRINGE | Freq: Once | INTRAMUSCULAR | Status: AC
  Administered 2023-09-11: 1 mg via INTRAVENOUS
  Filled 2023-09-11: qty 2

## 2023-09-11 MED ORDER — HEPARIN BOLUS VIA INFUSION
4000.0000 [IU] | Freq: Once | INTRAVENOUS | Status: AC
  Administered 2023-09-11: 4000 [IU] via INTRAVENOUS
  Filled 2023-09-11: qty 4000

## 2023-09-11 MED ORDER — ASPIRIN 300 MG RE SUPP
300.0000 mg | Freq: Once | RECTAL | Status: AC
  Administered 2023-09-11: 300 mg via RECTAL
  Filled 2023-09-11: qty 1

## 2023-09-11 MED ORDER — MORPHINE SULFATE (PF) 2 MG/ML IV SOLN
2.0000 mg | INTRAVENOUS | Status: DC | PRN
  Administered 2023-09-11 – 2023-09-12 (×4): 2 mg via INTRAVENOUS
  Filled 2023-09-11 (×4): qty 1

## 2023-09-11 MED ORDER — ATORVASTATIN CALCIUM 20 MG PO TABS
80.0000 mg | ORAL_TABLET | Freq: Every day | ORAL | Status: DC
  Administered 2023-09-11 – 2023-09-14 (×4): 80 mg via ORAL
  Filled 2023-09-11 (×2): qty 4
  Filled 2023-09-11: qty 1
  Filled 2023-09-11: qty 4

## 2023-09-11 MED ORDER — INSULIN ASPART 100 UNIT/ML IJ SOLN
0.0000 [IU] | INTRAMUSCULAR | Status: DC
  Administered 2023-09-11: 8 [IU] via SUBCUTANEOUS
  Administered 2023-09-12: 15 [IU] via SUBCUTANEOUS
  Administered 2023-09-12: 0 [IU] via SUBCUTANEOUS
  Administered 2023-09-12: 11 [IU] via SUBCUTANEOUS
  Administered 2023-09-13: 2 [IU] via SUBCUTANEOUS
  Administered 2023-09-13 (×2): 5 [IU] via SUBCUTANEOUS
  Administered 2023-09-13: 8 [IU] via SUBCUTANEOUS
  Administered 2023-09-14: 2 [IU] via SUBCUTANEOUS
  Administered 2023-09-14 (×2): 5 [IU] via SUBCUTANEOUS
  Filled 2023-09-11 (×10): qty 1

## 2023-09-11 MED ORDER — SPIRONOLACTONE 25 MG PO TABS
25.0000 mg | ORAL_TABLET | Freq: Every day | ORAL | Status: DC
  Administered 2023-09-12 – 2023-09-14 (×3): 25 mg via ORAL
  Filled 2023-09-11 (×3): qty 1

## 2023-09-11 MED ORDER — HEPARIN (PORCINE) 25000 UT/250ML-% IV SOLN
1500.0000 [IU]/h | INTRAVENOUS | Status: DC
  Administered 2023-09-11: 1000 [IU]/h via INTRAVENOUS
  Administered 2023-09-12: 1300 [IU]/h via INTRAVENOUS
  Administered 2023-09-13: 1500 [IU]/h via INTRAVENOUS
  Filled 2023-09-11 (×3): qty 250

## 2023-09-11 MED ORDER — IOHEXOL 350 MG/ML SOLN
100.0000 mL | Freq: Once | INTRAVENOUS | Status: AC | PRN
  Administered 2023-09-11: 75 mL via INTRAVENOUS

## 2023-09-11 MED ORDER — SODIUM CHLORIDE 0.9 % IV BOLUS
1000.0000 mL | Freq: Once | INTRAVENOUS | Status: AC
  Administered 2023-09-11: 1000 mL via INTRAVENOUS

## 2023-09-11 MED ORDER — INSULIN GLARGINE-YFGN 100 UNIT/ML ~~LOC~~ SOLN
20.0000 [IU] | Freq: Every day | SUBCUTANEOUS | Status: DC
  Administered 2023-09-11 – 2023-09-13 (×3): 20 [IU] via SUBCUTANEOUS
  Filled 2023-09-11 (×6): qty 0.2

## 2023-09-11 MED ORDER — DIAZEPAM 5 MG/ML IJ SOLN
5.0000 mg | Freq: Once | INTRAMUSCULAR | Status: AC
  Administered 2023-09-11: 5 mg via INTRAVENOUS
  Filled 2023-09-11: qty 2

## 2023-09-11 MED ORDER — ONDANSETRON HCL 4 MG/2ML IJ SOLN
4.0000 mg | Freq: Once | INTRAMUSCULAR | Status: AC
  Administered 2023-09-11: 4 mg via INTRAVENOUS
  Filled 2023-09-11: qty 2

## 2023-09-11 MED ORDER — CALCIUM GLUCONATE-NACL 1-0.675 GM/50ML-% IV SOLN
1.0000 g | Freq: Once | INTRAVENOUS | Status: AC
  Administered 2023-09-11: 1000 mg via INTRAVENOUS
  Filled 2023-09-11: qty 50

## 2023-09-11 MED ORDER — LOSARTAN POTASSIUM 50 MG PO TABS
25.0000 mg | ORAL_TABLET | Freq: Every day | ORAL | Status: DC
  Administered 2023-09-12 – 2023-09-14 (×3): 25 mg via ORAL
  Filled 2023-09-11 (×3): qty 1

## 2023-09-11 NOTE — H&P (Addendum)
History and Physical   Patient: Joshua [name] ZOX:[id number] DOB: 11-Mar-1971 DOA: 09/11/2023 DOS: the patient was seen and examined on 09/11/2023 PCP: System, Provider Not In  Patient coming from: Home  Chief Complaint:  Chief Complaint  Patient presents with   Chest Pain   Altered Mental Status   Shortness of Breath   HPI:   52 year old male with a past medical history of coronary artery disease status post STEMI in 2019 with PCI, chronic mixed systolic and diastolic heart failure, colon cancer status post colectomy in addition to type 2 diabetes mellitus who presented to the emergency department via EMS after he was complaining of severe chest pain.  Upon my exam the patient is somnolent will open eyes to vigorous sternal rub but does not provide any useful history.  Upon signout from my ER colleague and chart review the patient's troponin is notably elevated at 276, past medical records reviewed and summarized including the echocardiogram from 2022 EF was 25% that time type II diastolic function, UDS was notable positive for cocaine, past medical records reviewed and summarized of the patient's 2019 catheterization: Multivessel coronary disease100% mid LAD TIMI 0 was the culprit lesion IRASerial disease in the mid circumflex multiple lesions in the mid RCA and distalDistal 100% RCA with collaterals left to rightSuccessful PCI and stent of mid 100% LAD which was the IRADES stent deployed 3.0 x 23 mm Xience Sierra  Review of Systems:  Cannot Obtain Due to Mental Status  Past Medical History:  Diagnosis Date   Abrasion of toe with infection, right, subsequent encounter 03/27/2021   CHF (congestive heart failure) (HCC)    Diabetes mellitus without complication (HCC)    Hypertension    Past Surgical History:  Procedure Laterality Date   AMPUTATION TOE Right 04/01/2021   Procedure: AMPUTATION TOE;  Surgeon: Linus Galas, DPM;  Location: [facility] ORS;  Service: Podiatry;  Laterality:  Right;  RIGHT GREAT TOE   CORONARY/GRAFT ACUTE MI REVASCULARIZATION N/A 09/09/2018   Procedure: Coronary/Graft Acute MI Revascularization;  Surgeon: Alwyn Pea, MD;  Location: [facility] INVASIVE CV LAB;  Service: Cardiovascular;  Laterality: N/A;   LEFT HEART CATH AND CORONARY ANGIOGRAPHY N/A 09/09/2018   Procedure: LEFT HEART CATH AND CORONARY ANGIOGRAPHY;  Surgeon: Alwyn Pea, MD;  Location: [facility] INVASIVE CV LAB;  Service: Cardiovascular;  Laterality: N/A;   LOWER EXTREMITY ANGIOGRAPHY Right 02/14/2021   Procedure: Lower Extremity Angiography;  Surgeon: Renford Dills, MD;  Location: [facility] INVASIVE CV LAB;  Service: Cardiovascular;  Laterality: Right;   none     STENT PLACEMENT VASCULAR ([facility] HX)     Social History:  reports that he has been smoking. He has never used smokeless tobacco. He reports current alcohol use. He reports that he does not currently use drugs.  Allergies  Allergen Reactions   Penicillins Other (See Comments)    Patient unsure of allergy    Family History  Problem Relation Age of Onset   Cancer Mother    CAD Brother     Prior to Admission medications   Medication Sig Start Date End Date Taking? Authorizing Provider  atorvastatin (LIPITOR) 80 MG tablet Take 80 mg by mouth daily.    [provider]  furosemide (LASIX) 20 MG tablet Take 1 tablet (20 mg total) by mouth daily as needed (for weight gain >3lbs in 1 days and >5lbs in 2 days). 02/19/21 02/19/22  Arnetha Courser, MD  insulin glargine (LANTUS) 100 UNIT/ML Solostar Pen Inject 40 Units into  the skin daily. 02/19/21   Arnetha Courser, MD  losartan (COZAAR) 25 MG tablet Take 1 tablet (25 mg total) by mouth daily. 02/19/21 02/19/22  Arnetha Courser, MD  metFORMIN (GLUCOPHAGE) 500 MG tablet Take 1 tablet (500 mg total) by mouth daily with breakfast. 02/19/21 04/20/21  Arnetha Courser, MD  spironolactone (ALDACTONE) 25 MG tablet Take 1 tablet (25 mg total) by mouth daily. 02/19/21   Arnetha Courser, MD    Physical  Exam: Vitals:   09/11/23 1520 09/11/23 1530 09/11/23 1551 09/11/23 1600  BP:  (!) 162/109  (!) 158/113  Pulse:  (!) 113 (!) 107 (!) 108  Resp:  (!) 31 (!) 28 (!) 24  SpO2: (!) 2% (!) 85% 91% 92%  Patient seen and examined at 6:02 PM in ED room 17 with RN  Constitutional:  Vital Signs as per [facility] than three noted] No Acute Distress Eyes:  Pink Conjunctiva and no Ptosis Neck:     Trachea Midline, Neck Symmetric             Thyroid without tenderness, palpable masses or nodules Respiratory:   Respiratory Effort Intermittently Labored: No Use of Respiratory Muscles,No  Intercostal Retractions             Lungs Bibasilar Crackles to Auscultation Bilaterally Cardiovascular:   Heart Auscultated: Regular Regular without any added sounds or murmurs               Lower Extremity Edema Gastrointestinal:  Abdomen soft and nontender without palpable masses, guarding or rebound  No Palpable Splenomegaly or Hepatomegaly Lymphatic:  No Palpable Cervical Lymphadenopathy or Palpable No Axillary Lymphadenopathy Psychiatric:  Patient Somnolent, Opens eyes to sternal rub but yes/no answers Does not follow commands   Data Reviewed: ECG and Labs as per A/P  Assessment and Plan: * NSTEMI (non-ST elevated myocardial infarction) (HCC) Non-ST elevated myocardial infarction Likely cocaine induced in the setting of known coronary artery disease UDS is positive, 2019 catheterization showing multiple concerns Given the patient's mental status of change aspirin to rectal ECG twelve-lead independently reviewed and interpreted patient has a sinus tachycardia with a heart rate ventricular of 108 QTc is 487 without any significant ST deviations Troponin 276 elevated at 2400 Aspirin load, heparin drip, cardiac consultation: Will defer second agent to cardiology given the patient has concerns for diffuse diabetic disease from 2019 catheterization Consult s sent to [facility] consults as per amion  instructions given after 5 PM, continue high intensity statin, we will hold the patient's beta-blocker in the setting of suspected cocaine induced  Hyponatremia Hyponatremia We will follow this with diuresis  Hypocalcemia  Hypocalcemia Will order an ionized calcium as well as replacement  Soft tissue complaint Soft tissue prominence Incidental on the patient's CT scan Will need interval outpatient follow-up given his malignancy history  Acute on chronic combined systolic and diastolic CHF (congestive heart failure) (HCC)  Suspected acute on chronic mixed heart failure Patient's last echocardiogram in 2022 revealed EF of 25% and grade 2 diastolic dysfunction Patient has bibasilar crackles on examination as well as CT angio evidence of pulmonary edema, will obtain a brain atretic peptide repeat echocardiogram as well as diurese the patient with IV Lasix 40 mg IV twice daily which has been ordered  DM (diabetes mellitus), type 2 (HCC)  Type 2 diabetes mellitus with hyperglycemia Glucose is currently 269 We will keep the patient n.p.o. given the mental status change of the long-acting Lantus from home from 40 to 20 units  and provide sliding scale every 4 hour insulin Hold home metformin  Essential hypertension Hypertension Continue losartan 25 mg and spironolactone 25 mg given his EF  Acute metabolic encephalopathy Suspected metabolic encephalopathy In the setting of polysubstance withdrawal as well as acute MI Differential would also include a possible toxic encephalopathy from his cocaine Will hold his Wellbutrin and duloxetine CT head was unremarkable, pH was 7.31 and no evidence of CO2 retention ASA and salicylate was unremarkable Cocaine UDS positive  Acute hypoxic respiratory failure (HCC)  Acute hypoxic respiratory failure Patient is saturating less than 89% with dyspnea off of oxygen requiring 5 L to maintain 92% This is likely secondary to the patient's acute on  chronic heart failure Will continue to provide supplemental oxygen and titrate as able and address underlying cause for his respiratory failure     Advance Care Planning: Presumed Full  Consults: Cardiology  Family Communication: Called Daughter Kannen Peirson [phone] , went straight to voicemail  Severity of Illness: The appropriate patient status for this patient is INPATIENT. Inpatient status is judged to be reasonable and necessary in order to provide the required intensity of service to ensure the patient's safety. The patient's presenting symptoms, physical exam findings, and initial radiographic and laboratory data in the context of their chronic comorbidities is felt to place them at high risk for further clinical deterioration. Furthermore, it is not anticipated that the patient will be medically stable for discharge from the hospital within 2 midnights of admission.   * I certify that at the point of admission it is my clinical judgment that the patient will require inpatient hospital care spanning beyond 2 midnights from the point of admission due to high intensity of service, high risk for further deterioration and high frequency of surveillance required.*  Upon my evaluation, this patient had a high probability of imminent or life-threatening deterioration due to Acute hypoxic respiratory failure, acute on chronic mixed heart failure, non-ST elevated myocardial infarction as well as encephalopathy, which required my direct attention, intervention, and personal management. I have personally provided 50 minutes of critical care time exclusive of time spent on separately billable procedures. Time includes review of laboratory data, radiology results, discussion with consultants, and monitoring for potential decompensation. Interventions were performed as documented above.   Author: Princess Bruins, MD 09/11/2023 6:50 PM  For on call review [email or web address].

## 2023-09-11 NOTE — Assessment & Plan Note (Signed)
  Acute hypoxic respiratory failure Patient is saturating less than 89% with dyspnea off of oxygen requiring 5 L to maintain 92% This is likely secondary to the patient's acute on chronic heart failure Will continue to provide supplemental oxygen and titrate as able and address underlying cause for his respiratory failure

## 2023-09-11 NOTE — Progress Notes (Signed)
ANTICOAGULATION CONSULT NOTE -  Pharmacy Consult for IV heparin Indication:  NSTEMI  Allergies  Allergen Reactions   Penicillins Other (See Comments)    Patient unsure of allergy    Patient Measurements:   Heparin Dosing Weight: 82.5 kg  Vital Signs: BP: 158/113 (09/28 1600) Pulse Rate: 108 (09/28 1600)  Labs: Recent Labs    09/11/23 1520  HGB 14.8  HCT 46.0  PLT 267  CREATININE 1.07  TROPONINIHS 276*    CrCl cannot be calculated (Unknown ideal weight.).   Medical History: Past Medical History:  Diagnosis Date   Abrasion of toe with infection, right, subsequent encounter 03/27/2021   CHF (congestive heart failure) (HCC)    Diabetes mellitus without complication (HCC)    Hypertension     Medications:  No prior to admission anticoagulation per my chart review  Assessment: 52 year old male presenting with chest pain. Troponins elevated.  H&H stable. aPTT and PT-INR acceptable.  Goal of Therapy:  Heparin level 0.3-0.7 units/ml Monitor platelets by anticoagulation protocol: Yes   Plan:  Give 4000 units bolus x 1 Start heparin infusion at 1000 units/hr Check anti-Xa level in 6 hours and daily while on heparin Continue to monitor H&H and platelets   Elliot Gurney, PharmD, BCPS Clinical Pharmacist  09/11/2023 5:54 PM

## 2023-09-11 NOTE — ED Notes (Signed)
Attempted to start heparin gtt after obtaining US line for patient. This line sequentially infiltrated after receiving heparin bolus. Pharmacy was notified. Patient agitated at this and multiple IV sticks and stating he wants to leave. This RN called Dr. Para March and requested she come to bedside to speak with patient due to illness and breathing. Dr. Para March stated she would come down shortly.

## 2023-09-11 NOTE — ED Provider Notes (Signed)
[facility] Provider Note    Event Date/Time   First MD Initiated Contact with Patient 09/11/23 1519     (approximate)   History   Chief Complaint: Chest Pain, Altered Mental Status, and Shortness of Breath   HPI  Joshua [name] is a 52 y.o. male with a history of hypertension diabetes CHF polysubstance abuse who comes ED complaining of severe chest pain.  EMS report patient was in distress from severe pain on their arrival but also confused and not able to cooperate.  They applied Nitropaste, along with giving 2 doses of Narcan without improvement.  No known trauma.  Patient endorses central chest pain, nonradiating.  Unable to provide a detailed history.     Physical Exam   Triage Vital Signs: ED Triage Vitals  Encounter Vitals Group     BP      Systolic BP Percentile      Diastolic BP Percentile      Pulse      Resp      Temp      Temp src      SpO2      Weight      Height      Head Circumference      Peak Flow      Pain Score      Pain Loc      Pain Education      Exclude from Growth Chart     Most recent vital signs: Vitals:   09/11/23 1551 09/11/23 1600  BP:  (!) 158/113  Pulse: (!) 107 (!) 108  Resp: (!) 28 (!) 24  SpO2: 91% 92%    General: Awake, mild distress CV:  Good peripheral perfusion.  Tachycardia heart rate 110.  Symmetric distal pulses Resp:  Normal effort.  Clear to auscultation bilaterally Abd:  No distention.  Soft nontender Other:  Appears delirious/intoxicated.  Pupils are equal, round reactive, skin is dry.   ED Results / Procedures / Treatments   Labs (all labs ordered are listed, but only abnormal results are displayed) Labs Reviewed  ACETAMINOPHEN LEVEL - Abnormal; Notable for the following components:      Result Value   Acetaminophen (Tylenol), Serum <10 (*)    All other components within normal limits  SALICYLATE LEVEL - Abnormal; Notable for the following components:   Salicylate Lvl  <7.0 (*)    All other components within normal limits  COMPREHENSIVE METABOLIC PANEL - Abnormal; Notable for the following components:   Sodium 133 (*)    Glucose, Bld 269 (*)    Calcium 8.5 (*)    Total Bilirubin 1.3 (*)    All other components within normal limits  URINALYSIS, W/ REFLEX TO CULTURE (INFECTION SUSPECTED) - Abnormal; Notable for the following components:   Color, Urine STRAW (*)    APPearance CLEAR (*)    Glucose, UA 150 (*)    Hgb urine dipstick SMALL (*)    All other components within normal limits  URINE DRUG SCREEN, QUALITATIVE ([facility] ONLY) - Abnormal; Notable for the following components:   Cocaine Metabolite,Ur [place] POSITIVE (*)    All other components within normal limits  BLOOD GAS, VENOUS - Abnormal; Notable for the following components:   pO2, Ven <31 (*)    Acid-base deficit 3.3 (*)    All other components within normal limits  CBG MONITORING, ED - Abnormal; Notable for the following components:   Glucose-Capillary 234 (*)    All other components  within normal limits  TROPONIN I (HIGH SENSITIVITY) - Abnormal; Notable for the following components:   Troponin I (High Sensitivity) 276 (*)    All other components within normal limits  CBC WITH DIFFERENTIAL/PLATELET  ETHANOL  LIPASE, BLOOD  APTT  PROTIME-INR  TROPONIN I (HIGH SENSITIVITY)     EKG Interpreted by me Sinus tachycardia rate 107.  Right axis, normal intervals.  Poor R wave progression.  Normal ST segments and T waves.  Atrial enlargement.   RADIOLOGY CT head interpreted by me, negative for intracranial hemorrhage.  Radiology report reviewed  CTA chest abdomen pelvis pending   PROCEDURES:  .Critical Care  Performed by: Sharman Cheek, MD Authorized by: Sharman Cheek, MD   Critical care provider statement:    Critical care time (minutes):  35   Critical care time was exclusive of:  Separately billable procedures and treating other patients   Critical care was necessary to  treat or prevent imminent or life-threatening deterioration of the following conditions:  Cardiac failure and CNS failure or compromise   Critical care was time spent personally by me on the following activities:  Development of treatment plan with patient or surrogate, discussions with consultants, evaluation of patient's response to treatment, examination of patient, obtaining history from patient or surrogate, ordering and performing treatments and interventions, ordering and review of laboratory studies, ordering and review of radiographic studies, pulse oximetry, re-evaluation of patient's condition and review of old charts   Care discussed with: admitting provider   Comments:           MEDICATIONS ORDERED IN ED: Medications  diazepam (VALIUM) injection 5 mg (0 mg Intravenous Hold 09/11/23 1713)  aspirin chewable tablet 324 mg (has no administration in time range)  sodium chloride 0.9 % bolus 1,000 mL (0 mLs Intravenous Stopped 09/11/23 1754)  ondansetron (ZOFRAN) injection 4 mg (4 mg Intravenous Given 09/11/23 1548)  diazepam (VALIUM) injection 5 mg (5 mg Intravenous Given 09/11/23 1526)  naloxone (NARCAN) injection 1 mg (1 mg Intravenous Given 09/11/23 1558)  iohexol (OMNIPAQUE) 350 MG/ML injection 100 mL (75 mLs Intravenous Contrast Given 09/11/23 1651)     IMPRESSION / MDM / ASSESSMENT AND PLAN / ED COURSE  I reviewed the triage vital signs and the nursing notes.  DDx: Intracranial hemorrhage, aortic dissection, non-STEMI, intoxication, electrolyte abnormality, acidosis, DKA  Patient's presentation is most consistent with acute presentation with potential threat to life or bodily function.  Patient presents with altered mental status and severe chest pain.  Initial EKG unremarkable.  Will obtain stat CT head, lab panel, CTA chest.   Clinical Course as of 09/11/23 1756  Sat Sep 11, 2023  1555 CTH interpreted by me, no obvious ICH [PS]  1706 Troponin I (High Sensitivity)(!!):  276 Elevated troponin, if CTA negative for dissection, will start heparin [PS]  1742 CTA chest neg. For dissection [PS]    Clinical Course User Index [PS] Sharman Cheek, MD    ----------------------------------------- 5:56 PM on 09/11/2023 ----------------------------------------- UDS positive for cocaine, likely causing NSTEMI.  Aspirin and heparin ordered, case discussed with hospitalist for further management.   FINAL CLINICAL IMPRESSION(S) / ED DIAGNOSES   Final diagnoses:  NSTEMI, initial episode of care [facility])  Cocaine abuse (HCC)     Rx / DC Orders   ED Discharge Orders     None        Note:  This document was prepared using Dragon voice recognition software and may include unintentional dictation errors.   Sharman Cheek,  MD 09/11/23 1756

## 2023-09-11 NOTE — Assessment & Plan Note (Signed)
  Suspected acute on chronic mixed heart failure Patient's last echocardiogram in 2022 revealed EF of 25% and grade 2 diastolic dysfunction Patient has bibasilar crackles on examination as well as CT angio evidence of pulmonary edema, will obtain a brain atretic peptide repeat echocardiogram as well as diurese the patient with IV Lasix 40 mg IV twice daily which has been ordered

## 2023-09-11 NOTE — ED Triage Notes (Signed)
Pt to ED via [facility] for c/o breathing difficulties. Pt found on all fours screaming "chest pain." Given 2 Narcan without change, became less responsive and uncooperative. Applied 1 1/2" paste to left chest. Pt pale, clammy, cold, will not follow commands.

## 2023-09-11 NOTE — ED Notes (Signed)
Family has called to be updated on patient status. Brother Rudell Cobb) [phone] and patient's ex significant other (Tammy) [phone]. Patient consented to update these family members and states they would like to be called if anything changes with patient.

## 2023-09-11 NOTE — Assessment & Plan Note (Signed)
Hyponatremia We will follow this with diuresis

## 2023-09-11 NOTE — ED Notes (Signed)
Dr.Duncan to assess patient at this time and speak to patient about risk of leaving the hospital. Dr.Duncan to V.O. 2mg  morphine q2h for pain for patient. IV consults also placed per provider request.

## 2023-09-11 NOTE — Assessment & Plan Note (Signed)
Soft tissue prominence Incidental on the patient's CT scan Will need interval outpatient follow-up given his malignancy history

## 2023-09-11 NOTE — Assessment & Plan Note (Signed)
  Type 2 diabetes mellitus with hyperglycemia Glucose is currently 269 We will keep the patient n.p.o. given the mental status change of the long-acting Lantus from home from 40 to 20 units and provide sliding scale every 4 hour insulin Hold home metformin

## 2023-09-11 NOTE — ED Notes (Signed)
Core, MD, made aware of troponin 2400

## 2023-09-11 NOTE — Progress Notes (Signed)
CROSS COVER NOTE  NAME: Joshua [name] MRN: [id number] DOB : 11-24-1971    Concern as stated by nurse / staff   This is the guy in the ER wanting to [facility]      Pertinent findings on chart review: H&P reviewed admitted for NSTEMI, CHF exacerbation and acute respiratory failure on 5 L, in the setting of cocaine use  Patient assessment Spoke with patient who is complaining about a headache and stating that he keeps getting stuck and they are unable to find an IV line    09/11/2023    8:00 PM 09/11/2023    7:17 PM 09/11/2023    6:53 PM  Vitals with BMI  Height   5\' 10"   Weight   181 lbs 14 oz  BMI   26.1  Systolic 135 150   Diastolic 104 126   Pulse 111 107     Physical Exam Vitals and nursing note reviewed.  Constitutional:      General: He is not in acute distress.    Comments: Patient standing at side of bed, appears very dyspneic  HENT:     Head: Normocephalic and atraumatic.  Cardiovascular:     Rate and Rhythm: Regular rhythm. Tachycardia present.     Heart sounds: Normal heart sounds.  Pulmonary:     Effort: Tachypnea present.     Breath sounds: Normal breath sounds.  Abdominal:     Palpations: Abdomen is soft.     Tenderness: There is no abdominal tenderness.  Neurological:     Mental Status: Mental status is at baseline.       Assessment and  Interventions   Assessment:  Patient wants to sign out AMA in the setting of NSTEMI, acute CHF and respiratory failure    1.  Fortunately at this time, I was able to convince patient to stay. 2.  Will treat his headache.  Morphine ordered 3.  Advised nurse to provide him with a phone to contact his family 4. Patient has been advised that this is not Medically advisable at this time, and can result in Medical complications like Death and Disability.  Patient has full decision making capacity and understands and accepts the risks involved and assumes full responsibilty of this decision

## 2023-09-11 NOTE — Assessment & Plan Note (Signed)
  Hypocalcemia Will order an ionized calcium as well as replacement

## 2023-09-11 NOTE — Assessment & Plan Note (Signed)
Hypertension Continue losartan 25 mg and spironolactone 25 mg given his EF

## 2023-09-11 NOTE — ED Notes (Signed)
Spoke with Joshua [name] in pharmacy who informed this RN there is nothing that can be done for infiltrated heparin in patients right upper arm, medication will just have to absorb into body

## 2023-09-11 NOTE — ED Notes (Signed)
Joshua [name] at bedside and states she is fiance. She has been updated on patient care at this time and plan. Patient is sleeping currently.

## 2023-09-11 NOTE — Assessment & Plan Note (Signed)
Non-ST elevated myocardial infarction Likely cocaine induced in the setting of known coronary artery disease UDS is positive, 2019 catheterization showing multiple concerns Given the patient's mental status of change aspirin to rectal ECG twelve-lead independently reviewed and interpreted patient has a sinus tachycardia with a heart rate ventricular of 108 QTc is 487 without any significant ST deviations Troponin 276 elevated at 2400 Aspirin load, heparin drip, cardiac consultation: Will defer second agent to cardiology given the patient has concerns for diffuse diabetic disease from 2019 catheterization Consult s sent to [facility] consults as per amion instructions given after 5 PM, continue high intensity statin, we will hold the patient's beta-blocker in the setting of suspected cocaine induced

## 2023-09-11 NOTE — Assessment & Plan Note (Signed)
Suspected metabolic encephalopathy In the setting of polysubstance withdrawal as well as acute MI Differential would also include a possible toxic encephalopathy from his cocaine Will hold his Wellbutrin and duloxetine CT head was unremarkable, pH was 7.31 and no evidence of CO2 retention ASA and salicylate was unremarkable Cocaine UDS positive

## 2023-09-12 ENCOUNTER — Inpatient Hospital Stay
Admit: 2023-09-12 | Discharge: 2023-09-12 | Disposition: A | Discharge status: Home / Self Care | Payer: 59 | Attending: Internal Medicine | Admitting: Internal Medicine

## 2023-09-12 ENCOUNTER — Encounter: Payer: Self-pay | Admitting: Internal Medicine

## 2023-09-12 DIAGNOSIS — I214 Non-ST elevation (NSTEMI) myocardial infarction: Secondary | ICD-10-CM | POA: Diagnosis not present

## 2023-09-12 LAB — BASIC METABOLIC PANEL
Anion gap: 9 (ref 5–15)
BUN: 13 mg/dL (ref 6–20)
CO2: 26 mmol/L (ref 22–32)
Calcium: 8.9 mg/dL (ref 8.9–10.3)
Chloride: 105 mmol/L (ref 98–111)
Creatinine, Ser: 1.15 mg/dL (ref 0.61–1.24)
GFR, Estimated: >60 mL/min (ref >60)
Glucose, Bld: 111 mg/dL — ABNORMAL HIGH (ref 70–99)
Potassium: 4 mmol/L (ref 3.5–5.1)
Sodium: 140 mmol/L (ref 135–145)

## 2023-09-12 LAB — BLOOD GAS, VENOUS
Bicarbonate: 23.2 mmol/L — ABNORMAL HIGH (ref 20.0–28.0)
O2 Saturation: 35.7 mmol/L — ABNORMAL HIGH (ref 0.0–2.0)
Patient temperature: 37
Patient temperature: 37 %
pCO2, Ven: 46 mm[Hg] (ref 44–60)
pH, Ven: 7.31 (ref 7.25–7.43)
pO2, Ven: <31 mmol/L — CL (ref 32–45)

## 2023-09-12 LAB — CBG MONITORING, ED
Glucose-Capillary: 100 mg/dL — ABNORMAL HIGH (ref 70–99)
Glucose-Capillary: 114 mg/dL — ABNORMAL HIGH (ref 70–99)
Glucose-Capillary: 324 mg/dL — ABNORMAL HIGH (ref 70–99)
Glucose-Capillary: 84 mg/dL (ref 70–99)
Glucose-Capillary: 91 mg/dL (ref 70–99)

## 2023-09-12 LAB — CBC
HCT: 48.3 % (ref 39.0–52.0)
Hemoglobin: 15.7 g/dL (ref 13.0–17.0)
MCH: 29.8 pg (ref 26.0–34.0)
MCHC: 32.5 g/dL (ref 30.0–36.0)
MCV: 91.7 fL (ref 80.0–100.0)
Platelets: 280 10*3/uL (ref 150–400)
RBC: 5.27 MIL/uL (ref 4.22–5.81)
RDW: 14 % (ref 11.5–15.5)
WBC: 8.7 10*3/uL (ref 4.0–10.5)
nRBC: 0 % (ref 0.0–0.2)

## 2023-09-12 LAB — ECHOCARDIOGRAM COMPLETE
Height: 70 in
Weight: 2910.07 [oz_av]

## 2023-09-12 LAB — GLUCOSE, CAPILLARY
Glucose-Capillary: 141 mg/dL — ABNORMAL HIGH (ref 70–99)
Glucose-Capillary: 186 mg/dL — ABNORMAL HIGH (ref 70–99)
Glucose-Capillary: 362 mg/dL — ABNORMAL HIGH (ref 70–99)

## 2023-09-12 LAB — PHOSPHORUS: Phosphorus: 4.2 mg/dL (ref 2.5–4.6)

## 2023-09-12 LAB — HIV ANTIBODY (ROUTINE TESTING W REFLEX): HIV Screen 4th Generation wRfx: NONREACTIVE

## 2023-09-12 LAB — MAGNESIUM: Magnesium: 2.1 mg/dL (ref 1.7–2.4)

## 2023-09-12 LAB — HEMOGLOBIN A1C
Hgb A1c MFr Bld: 10.1 % — ABNORMAL HIGH (ref 4.8–5.6)
Mean Plasma Glucose: 243.17 mg/dL

## 2023-09-12 LAB — TSH: TSH: 0.828 u[IU]/mL (ref 0.350–4.500)

## 2023-09-12 LAB — HEPARIN LEVEL (UNFRACTIONATED)
Heparin Unfractionated: 0.18 [IU]/mL — ABNORMAL LOW (ref 0.30–0.70)
Heparin Unfractionated: <0.1 [IU]/mL — ABNORMAL LOW (ref 0.30–0.70)
Heparin Unfractionated: 0.41 [IU]/mL (ref 0.30–0.70)
Heparin Unfractionated: 0.28 [IU]/mL — ABNORMAL LOW (ref 0.30–0.70)
Heparin Unfractionated: 0.51 [IU]/mL (ref 0.30–0.70)

## 2023-09-12 LAB — TROPONIN I (HIGH SENSITIVITY): Troponin I (High Sensitivity): 9101 ng/L (ref <18)

## 2023-09-12 MED ORDER — HEPARIN BOLUS VIA INFUSION
2500.0000 [IU] | Freq: Once | INTRAVENOUS | Status: AC
  Administered 2023-09-12: 2500 [IU] via INTRAVENOUS
  Filled 2023-09-12: qty 2500

## 2023-09-12 MED ORDER — HYDROCORTISONE ACETATE 25 MG RE SUPP
25.0000 mg | Freq: Two times a day (BID) | RECTAL | Status: AC
  Administered 2023-09-12 – 2023-09-13 (×2): 25 mg via RECTAL
  Filled 2023-09-12 (×4): qty 1

## 2023-09-12 MED ORDER — HEPARIN BOLUS VIA INFUSION
1200.0000 [IU] | Freq: Once | INTRAVENOUS | Status: AC
  Administered 2023-09-12: 1200 [IU] via INTRAVENOUS
  Filled 2023-09-12: qty 1200

## 2023-09-12 MED ORDER — ACETAMINOPHEN 325 MG PO TABS
650.0000 mg | ORAL_TABLET | Freq: Four times a day (QID) | ORAL | Status: DC | PRN
  Administered 2023-09-12 – 2023-09-13 (×2): 650 mg via ORAL
  Filled 2023-09-12 (×2): qty 2

## 2023-09-12 MED ORDER — ASPIRIN 81 MG PO TBEC
81.0000 mg | DELAYED_RELEASE_TABLET | Freq: Every day | ORAL | Status: DC
  Administered 2023-09-12 – 2023-09-14 (×3): 81 mg via ORAL
  Filled 2023-09-12 (×3): qty 1

## 2023-09-12 MED ORDER — HYDROCORTISONE ACETATE 25 MG RE SUPP: 25.0000 mg | Freq: Two times a day (BID) | RECTAL | Status: DC | PRN

## 2023-09-12 NOTE — ED Notes (Signed)
Patient awake and sitting up in bed stating he had severe headache and needed pain medicine. Medicated per PRN orders. Laid patient back down to safe position in bed. VSS, CCM in use, no other needs identified at this time.

## 2023-09-12 NOTE — Consult Note (Signed)
CARDIOLOGY CONSULT NOTE               Patient ID: Joshua [name] MRN: [id number] DOB/AGE: 1971/07/29 52 y.o.  Admit date: 09/11/2023 Referring Physician Dr. Gillis Santa hospitalist Primary Physician  Primary Cardiologist Dr Milta Deiters Reason for Consultation non-STEMI shortness of breath altered mental status  HPI: Patient is a 52 year old male history of known coronary disease status post STEMI 2019 PCI and stent chronic systolic diastolic congestive heart failure EF around 25% history of colon cancer status post colectomy he has diabetes substance abuse presented with shortness of breath dyspnea elevated troponin which currently is peaked at 2400 patient has known multivessel coronary disease but had significant PCI and stent in 2019 to LAD patient presented with shortness of breath dyspnea on this presentation some chest discomfort finally had rising troponins patient states he been noncompliant with his medications has not followed up as he showed with his primary physician or cardiology but presented to emergency room and then was admitted patient currently is pain-free resting comfortably in bed.  Patient states she is currently given up on life after his colon cancer and had stopped taking care of himself.  Currently denies any suicidal thoughts or ideation  Review of systems complete and found to be negative unless listed above     Past Medical History:  Diagnosis Date   Abrasion of toe with infection, right, subsequent encounter 03/27/2021   CHF (congestive heart failure) (HCC)    Diabetes mellitus without complication (HCC)    Hypertension     Past Surgical History:  Procedure Laterality Date   AMPUTATION TOE Right 04/01/2021   Procedure: AMPUTATION TOE;  Surgeon: Linus Galas, DPM;  Location: [facility] ORS;  Service: Podiatry;  Laterality: Right;  RIGHT GREAT TOE   CORONARY/GRAFT ACUTE MI REVASCULARIZATION N/A 09/09/2018   Procedure: Coronary/Graft Acute MI  Revascularization;  Surgeon: Alwyn Pea, MD;  Location: [facility] INVASIVE CV LAB;  Service: Cardiovascular;  Laterality: N/A;   LEFT HEART CATH AND CORONARY ANGIOGRAPHY N/A 09/09/2018   Procedure: LEFT HEART CATH AND CORONARY ANGIOGRAPHY;  Surgeon: Alwyn Pea, MD;  Location: [facility] INVASIVE CV LAB;  Service: Cardiovascular;  Laterality: N/A;   LOWER EXTREMITY ANGIOGRAPHY Right 02/14/2021   Procedure: Lower Extremity Angiography;  Surgeon: Renford Dills, MD;  Location: [facility] INVASIVE CV LAB;  Service: Cardiovascular;  Laterality: Right;   none     STENT PLACEMENT VASCULAR ([facility] HX)      (Not in a hospital admission)  Social History   Socioeconomic History   Marital status: Single    Spouse name: Not on file   Number of children: Not on file   Years of education: Not on file   Highest education level: Not on file  Occupational History   Not on file  Tobacco Use   Smoking status: Every Day   Smokeless tobacco: Never  Vaping Use   Vaping status: Never Used  Substance and Sexual Activity   Alcohol use: Yes    Alcohol/week: 0.0 standard drinks of alcohol   Drug use: Not Currently    Comment: pt states no found unknown white substance in mouth   Sexual activity: Not on file  Other Topics Concern   Not on file  Social History Narrative   Not on file   Social Determinants of Health   Financial Resource Strain: High Risk (04/10/2022)   Received from [facility], [facility]   Overall Financial Resource Strain (CARDIA)  Difficulty of Paying Living Expenses: Very hard  Food Insecurity: Food Insecurity Present (04/10/2022)   Received from [facility], [facility]   Hunger Vital Sign    Worried About Running Out of Food in the Last Year: Sometimes true    Ran Out of Food in the Last Year: Sometimes true  Transportation Needs: Unmet Transportation Needs (12/19/2021)   Received from [facility], [facility]   [facility] - Transportation    Lack of  Transportation (Medical): Yes    Lack of Transportation (Non-Medical): Yes  Physical Activity: Not on file  Stress: Stress Concern Present (12/19/2021)   Received from [facility], [facility] of Occupational Health - Occupational Stress Questionnaire    Feeling of Stress : Rather much  Social Connections: Not on file  Intimate Partner Violence: Not on file    Family History  Problem Relation Age of Onset   Cancer Mother    CAD Brother       Review of systems complete and found to be negative unless listed above      PHYSICAL EXAM  General: Well developed, well nourished, in no acute distress HEENT:  Normocephalic and atramatic Neck:  No JVD.  Lungs: Clear bilaterally to auscultation and percussion. Heart: HRRR . Normal S1 and S2 without gallops or murmurs.  Abdomen: Bowel sounds are positive, abdomen soft and non-tender  Msk:  Back normal, normal gait. Normal strength and tone for age. Extremities: No clubbing, cyanosis or edema.   Neuro: Alert and oriented X 3. Psych:  Good affect, responds appropriately  Labs:   Lab Results  Component Value Date   WBC 8.7 09/12/2023   HGB 15.7 09/12/2023   HCT 48.3 09/12/2023   MCV 91.7 09/12/2023   PLT 280 09/12/2023    Recent Labs  Lab 09/11/23 2042 09/12/23 0758  NA 134* 140  K 4.9 4.0  CL 100 105  CO2 23 26  BUN 14 13  CREATININE 1.01 1.15  CALCIUM 8.9 8.9  PROT 7.7  --   BILITOT 1.2  --   ALKPHOS 86  --   ALT 23  --   AST 61*  --   GLUCOSE 292* 111*   Lab Results  Component Value Date   CKTOTAL 601 (H) 09/11/2023   TROPONINI >65.00 (HH) 09/09/2018    Lab Results  Component Value Date   CHOL 123 09/10/2018   Lab Results  Component Value Date   HDL 38 (L) 09/10/2018   Lab Results  Component Value Date   LDLCALC 54 09/10/2018   Lab Results  Component Value Date   TRIG 155 (H) 09/10/2018   Lab Results  Component Value Date   CHOLHDL 3.2 09/10/2018   No results found  for: "LDLDIRECT"    Radiology: CT Angio Chest/Abd/Pel for Dissection W and/or Wo Contrast  Result Date: 09/11/2023 CLINICAL DATA:  Chest pain with breathing difficulties. Acute aortic syndrome suspected. EXAM: CT ANGIOGRAPHY CHEST, ABDOMEN AND PELVIS TECHNIQUE: Non-contrast CT of the chest was initially obtained. Multidetector CT imaging through the chest, abdomen and pelvis was performed using the standard protocol during bolus administration of intravenous contrast. Multiplanar reconstructed images and MIPs were obtained and reviewed to evaluate the vascular anatomy. RADIATION DOSE REDUCTION: This exam was performed according to the departmental dose-optimization program which includes automated exposure control, adjustment of the mA and/or kV according to patient size and/or use of iterative reconstruction technique. CONTRAST:  75mL OMNIPAQUE IOHEXOL  350 MG/ML SOLN COMPARISON:  Chest CTA and abdominopelvic CT 11/16/2021. FINDINGS: CTA CHEST FINDINGS Cardiovascular: Pre contrast images demonstrate extensive coronary artery atherosclerosis. There are no displaced intimal calcifications within the thoracic aorta. Following contrast, there is no evidence of aortic dissection, aneurysm or intimal ulceration. There is no evidence of acute pulmonary embolism. The heart is mildly enlarged. Trace pericardial fluid. Mediastinum/Nodes: New ill-defined soft tissue thickening throughout the mediastinal fat with extension into the hilar regions. No discretely enlarged mediastinal lymph nodes are identified. The thyroid gland, trachea and esophagus demonstrate no significant findings. Lungs/Pleura: Trace bilateral pleural effusions. No pneumothorax. There is new diffuse central airway thickening with septal and fissural thickening and patchy ground-glass opacities throughout both lungs. No confluent airspace disease or suspicious pulmonary nodularity. Musculoskeletal/Chest wall: No chest wall mass or suspicious osseous  findings. Review of the MIP images confirms the above findings. CTA ABDOMEN AND PELVIS FINDINGS VASCULAR Aorta: Mild aortic atherosclerosis. Normal caliber aorta without aneurysm, dissection or significant stenosis. Celiac: Patent without evidence of aneurysm, dissection or significant stenosis. SMA: Patent without evidence of aneurysm, dissection or significant stenosis. Renals: Both renal arteries are patent without evidence of aneurysm, dissection or significant stenosis. IMA: Patent without evidence of aneurysm, dissection, vasculitis or significant stenosis. Inflow: Bilateral iliac atherosclerosis. Patent without evidence of aneurysm, dissection, vasculitis or significant stenosis. Veins: No obvious venous abnormality within the limitations of this arterial phase study. Review of the MIP images confirms the above findings. NON-VASCULAR FINDINGS Hepatobiliary: The liver is normal in density without suspicious focal abnormality. There is mild reflux of contrast into the IVC and hepatic veins. No evidence of gallstones, gallbladder wall thickening or biliary dilatation. Pancreas: Unremarkable. No pancreatic ductal dilatation or surrounding inflammatory changes. Spleen: Normal in size without focal abnormality. Adrenals/Urinary Tract: Both adrenal glands appear normal. The kidneys appear normal without evidence of urinary tract calculus, suspicious lesion or hydronephrosis. The urinary bladder is mildly distended without focal abnormality. Stomach/Bowel: No enteric contrast administered. The stomach appears unremarkable for its degree of distension. No evidence of bowel wall thickening, distention or surrounding inflammatory change. Evidence of interval right hemicolectomy with ileocolonic anastomosis. No evidence of recurrent bowel obstruction. Lymphatic: Nodal assessment limited by paucity of retroperitoneal fat. No discretely enlarged abdominopelvic lymph nodes identified. Reproductive: The prostate gland and  seminal vesicles appear unremarkable. Bilateral scrotal hydroceles noted. Other: No evidence of abdominal wall mass or hernia. No ascites. Postsurgical changes in the anterior abdominal wall. Musculoskeletal: No acute or significant osseous findings. Review of the MIP images confirms the above findings. IMPRESSION: 1. No evidence of acute aortic syndrome or acute pulmonary embolism. No acute vascular findings in the chest, abdomen or pelvis. 2. Coronary artery, aortic and iliac atherosclerosis. 3. New diffuse central airway thickening with septal and fissural thickening and patchy ground-glass opacities throughout both lungs, suspicious for pulmonary edema. Trace bilateral pleural effusions. 4. New ill-defined soft tissue thickening throughout the mediastinal fat with extension into the hilar regions, nonspecific. This could be secondary to edema or inflammation. No discretely enlarged lymph nodes identified. If the suspected pulmonary edema does not resolve with appropriate treatment, follow-up chest CT recommended to exclude atypical malignancy (especially if there is a history of malignancy as suggested by previous abdominal CT). 5. Interval right hemicolectomy with ileocolonic anastomosis. No evidence of recurrent bowel obstruction. Electronically Signed   By: Carey Bullocks M.D.   On: 09/11/2023 17:38   CT Head Wo Contrast  Result Date: 09/11/2023 CLINICAL DATA:  Mental status change. EXAM: CT  HEAD WITHOUT CONTRAST TECHNIQUE: Contiguous axial images were obtained from the base of the skull through the vertex without intravenous contrast. RADIATION DOSE REDUCTION: This exam was performed according to the departmental dose-optimization program which includes automated exposure control, adjustment of the mA and/or kV according to patient size and/or use of iterative reconstruction technique. COMPARISON:  None Available. FINDINGS: Brain: No acute intracranial hemorrhage. No focal mass lesion. No CT evidence of  acute infarction. No midline shift or mass effect. No hydrocephalus. Basilar cisterns are patent. Benign vascular space in the medial RIGHT parietal lobe. Vascular: No hyperdense vessel or unexpected calcification. Skull: Normal. Negative for fracture or focal lesion. Sinuses/Orbits: Chronic thickening of the sphenoid sinus walls. Frontal sinuses clear. Other: None. IMPRESSION: 1. No intracranial findings. 2. Chronic sphenoid sinus disease. Electronically Signed   By: Genevive Bi M.D.   On: 09/11/2023 16:09    EKG: Sinus tach rate of 100 biatrial enlargement nonspecific ST-T wave changes  ASSESSMENT AND PLAN:  Non-STEMI Multivessel coronary disease History of PCI and stent Acute on chronic systolic diastolic congestive heart failure Hyponatremia Cardiomyopathy Acute hypoxic respiratory failure History of substance abuse  Plan Non-STEMI elevated troponins up to 2400 recommend heparin for anticoagulation telemetry consider invasive evaluation with cardiac cath prior to discharge Possible substance abuse recommend UDS previous history of positive cocaine Previous PCI and stent continue aspirin anticoagulation statin ACE ARB beta-blocker if negative for complaint for cocaine Acute on chronic systolic diastolic congestive heart failure EF around 25% recommend ACE or ARB beta-blocker spironolactone aspirin statin IV diuretic therapy Diabetes type 2 uncomplicated continue current management will hold metformin for now in anticipation of possible cardiac cath maintain Lantus therapy consider Farxiga Hypertension currently on losartan spironolactone consider adding beta-blockade therapy with metoprolol or Coreg Echocardiogram for reassessment of left ventricular function wall motion Consider cardiac cath with Dr. Welton Flakes prior to discharge  Signed: Alwyn Pea MD 09/12/2023, 1:25 PM

## 2023-09-12 NOTE — ED Notes (Signed)
This RN noticed that heparin gtt did not have orders to adjust gtt rate or prn boluses. Consulted with pharmacy who stated changes were not needed. Secure chat messaged Dr. Para March to notify and see if she wanted changes.

## 2023-09-12 NOTE — ED Notes (Signed)
Patient called out requesting pain medicine for headache again. Medicated per MAR. Patient requests to speak with chaplain, also mentions having a hernia and wanting it fixed now. This RN attempted to discuss that the patient would have to have be cleared via his heart and respiratory symptoms and that we would not be fixing everything at once for him. He seems irritated with this RN and repeats "Who can I talk to about it then since you don't care". Again told patient the current plan of care and that he could speak to any physician about his request about his hernia.

## 2023-09-12 NOTE — ED Notes (Signed)
Phlebotomy called to obtain heparin level.

## 2023-09-12 NOTE — Progress Notes (Signed)
ANTICOAGULATION CONSULT NOTE -  Pharmacy Consult for IV heparin Indication:  NSTEMI  Allergies  Allergen Reactions   Penicillins Other (See Comments)    Patient unsure of allergy    Patient Measurements: Height: 5\' 10"  (177.8 cm) Weight: 82.5 kg (181 lb 14.1 oz) IBW/kg (Calculated) : 73 Heparin Dosing Weight: 82.5 kg  Vital Signs: Temp: 97.9 F (36.6 C) (09/29 0027) Temp Source: Axillary (09/29 0027) BP: 137/98 (09/29 0100) Pulse Rate: 98 (09/29 0100)  Labs: Recent Labs    09/11/23 1520 09/11/23 1746 09/11/23 1803 09/11/23 2042 09/12/23 0052  HGB 14.8  --   --  15.3  --   HCT 46.0  --   --  47.9  --   PLT 267  --   --  284  --   APTT  --   --  28  --   --   LABPROT  --   --  15.6*  --   --   INR  --   --  1.2  --   --   HEPARINUNFRC  --   --   --   --  0.18*  CREATININE 1.07  --   --  1.01  --   CKTOTAL  --   --   --  601*  --   TROPONINIHS 276* 2,400*  --   --   --     Estimated Creatinine Clearance: 89.3 mL/min (by C-G formula based on SCr of 1.01 mg/dL).   Medical History: Past Medical History:  Diagnosis Date   Abrasion of toe with infection, right, subsequent encounter 03/27/2021   CHF (congestive heart failure) (HCC)    Diabetes mellitus without complication (HCC)    Hypertension     Medications:  No prior to admission anticoagulation per my chart review  Assessment: 52 year old male presenting with chest pain. Troponins elevated.  H&H stable. aPTT and PT-INR acceptable.  Goal of Therapy:  Heparin level 0.3-0.7 units/ml Monitor platelets by anticoagulation protocol: Yes   Plan:  9/29:  HL @ 0052 = 0.18, SUBtherapeutic  - Will order heparin 2500 units IV X 1 bolus and increase drip rate to 1300 units/hr - Will recheck HL 6 hrs after rate change    Sharnika Binney D Clinical Pharmacist  09/12/2023 2:11 AM

## 2023-09-12 NOTE — Progress Notes (Signed)
ANTICOAGULATION CONSULT NOTE -  Pharmacy Consult for IV heparin Indication:  NSTEMI  Allergies  Allergen Reactions   Penicillins Other (See Comments)    Patient unsure of allergy    Patient Measurements: Height: 5\' 10"  (177.8 cm) Weight: 82.5 kg (181 lb 14.1 oz) IBW/kg (Calculated) : 73 Heparin Dosing Weight: 82.5 kg  Vital Signs: Temp: 98.3 F (36.8 C) (09/29 2002) Temp Source: Oral (09/29 1700) BP: 103/82 (09/29 2002) Pulse Rate: 103 (09/29 2002)  Labs: Recent Labs    09/11/23 1520 09/11/23 1746 09/11/23 1803 09/11/23 2042 09/11/23 2123 09/12/23 0758 09/12/23 1545 09/12/23 2243  HGB 14.8  --   --  15.3  --  15.7  --   --   HCT 46.0  --   --  47.9  --  48.3  --   --   PLT 267  --   --  284  --  280  --   --   APTT  --   --  28  --   --   --   --   --   LABPROT  --   --  15.6*  --   --   --   --   --   INR  --   --  1.2  --   --   --   --   --   HEPARINUNFRC  --   --   --   --    < > 0.41 0.28* 0.51  CREATININE 1.07  --   --  1.01  --  1.15  --   --   CKTOTAL  --   --   --  601*  --   --   --   --   TROPONINIHS 276* 2,400*  --   --   --  9,101*  --   --    < > = values in this interval not displayed.    Estimated Creatinine Clearance: 78.5 mL/min (by C-G formula based on SCr of 1.15 mg/dL).   Medical History: Past Medical History:  Diagnosis Date   Abrasion of toe with infection, right, subsequent encounter 03/27/2021   CHF (congestive heart failure) (HCC)    Diabetes mellitus without complication (HCC)    Hypertension     Medications:  No prior to admission anticoagulation per my chart review  Assessment: 52 year old male presenting with chest pain. Troponins elevated at 9K. NO DOAC PTA. UDS positive for cocaine.   09/29 0758 HL 0.41  09/29 2243 HL 0.51, therapeutic X 1    Goal of Therapy:  Heparin level 0.3-0.7 units/ml Monitor platelets by anticoagulation protocol: Yes   Plan: 9/29:  HL @ 2243 = 0.51, therapeutic X 1  - will continue pt  on current rate and recheck HL in 6 hrs on 9/30 @ 0500   Susan Arana D, PharmD Clinical Pharmacist  09/12/2023 11:35 PM

## 2023-09-12 NOTE — Progress Notes (Signed)
ANTICOAGULATION CONSULT NOTE -  Pharmacy Consult for IV heparin Indication:  NSTEMI  Allergies  Allergen Reactions   Penicillins Other (See Comments)    Patient unsure of allergy    Patient Measurements: Height: 5\' 10"  (177.8 cm) Weight: 82.5 kg (181 lb 14.1 oz) IBW/kg (Calculated) : 73 Heparin Dosing Weight: 82.5 kg  Vital Signs: Temp: 97.5 F (36.4 C) (09/29 1700) Temp Source: Oral (09/29 1700) BP: 118/92 (09/29 1600) Pulse Rate: 96 (09/29 1600)  Labs: Recent Labs    09/11/23 1520 09/11/23 1746 09/11/23 1803 09/11/23 2042 09/11/23 2123 09/12/23 0052 09/12/23 0758 09/12/23 1545  HGB 14.8  --   --  15.3  --   --  15.7  --   HCT 46.0  --   --  47.9  --   --  48.3  --   PLT 267  --   --  284  --   --  280  --   APTT  --   --  28  --   --   --   --   --   LABPROT  --   --  15.6*  --   --   --   --   --   INR  --   --  1.2  --   --   --   --   --   HEPARINUNFRC  --   --   --   --    < > 0.18* 0.41 0.28*  CREATININE 1.07  --   --  1.01  --   --  1.15  --   CKTOTAL  --   --   --  601*  --   --   --   --   TROPONINIHS 276* 2,400*  --   --   --   --  9,101*  --    < > = values in this interval not displayed.    Estimated Creatinine Clearance: 78.5 mL/min (by C-G formula based on SCr of 1.15 mg/dL).   Medical History: Past Medical History:  Diagnosis Date   Abrasion of toe with infection, right, subsequent encounter 03/27/2021   CHF (congestive heart failure) (HCC)    Diabetes mellitus without complication (HCC)    Hypertension     Medications:  No prior to admission anticoagulation per my chart review  Assessment: 52 year old male presenting with chest pain. Troponins elevated at 9K. NO DOAC PTA. UDS positive for cocaine.   09/29 0758 HL 0.41    Goal of Therapy:  Heparin level 0.3-0.7 units/ml Monitor platelets by anticoagulation protocol: Yes   Plan: heparin level is subtherapeutic  Heparin bolus 1200 units Will continue heparin infusion to 1500  units/hr.  Recheck heparin level in 6 hours. CBC daily while on heparin.    Jaynie Bream, PharmD, BCPS Clinical Pharmacist  09/12/2023 5:01 PM

## 2023-09-12 NOTE — Progress Notes (Signed)
[facility] Progress Note  Patient: Joshua [name]    WUJ:[id number]  DOA: 09/11/2023     Date of Service: the patient was seen and examined on 09/12/2023  Chief Complaint  Patient presents with   Chest Pain   Altered Mental Status   Shortness of Breath   Brief hospital course: 52 year old male with a PMH of CAD s/p STEMI in 2019 with PCI, chronic mixed systolic and diastolic heart failure, colon cancer s/p colectomy, IDDM T2, PAD, who presented at [facility] ED via EMS  due to c/o severe chest pain.  In the ED patient was somnolent, opened eyes to vigorous sternal rub but did not provide any useful history.  Upon signout from ER physician and chart review the patient's troponin is notably elevated at 276, past medical records reviewed and summarized including the echocardiogram from 2022 EF was 25% that time grade II diastolic function, UDS was notable positive for cocaine, past medical records reviewed and summarized of the patient's 2019 catheterization:  Multivessel coronary disease100% mid LAD TIMI 0 was the culprit lesion IRASerial disease in the mid circumflex multiple lesions in the mid RCA and distalDistal 100% RCA with collaterals left to rightSuccessful PCI and stent of mid 100% LAD which was the IRADES stent deployed 3.0 x 23 mm X[facility] consulted for admission and further management as below.  Assessment and Plan:  Non-STEMI History of CAD s/p stents.  Patient presented with chest pain, most likely demand ischemia due to cocaine abuse Patient is noncompliant with medications. Patient does not take aspirin at home Aspirin 300 mg rectal suppository was given in the ED Heparin IV infusion was started Troponin peaked 24K, gradually trending down  Continue to monitor on telemetry Started aspirin 81 mg p.o. daily Follow cardiology consult for further recommendation Keep n.p.o. after midnight tomorrow a.m. for possible cath Follow repeat TTE   Combined acute on  chronic systolic and diastolic CHF exacerbation Prior TTE shows LVEF 25 to 30% and grade 2 diastolic dysfunction, severe LV hypokinesis. BNP 1090 elevated Continue Lasix 40 mg IV twice daily  Continue spironolactone 25 mg p.o. daily and losartan 25 mg p.o. daily Avoided beta-blocker and calcium blocker due to cocaine use Continue fluid restriction, monitor intake and output and daily body weight Follow cardiology for further recommendation Repeat 2D echocardiogram  Hypertension and HLD Continued statin, losartan, spironolactone and IV Lasix Monitor BP and titrate medications accordingly   IDDM T2, HbA1c 10.1, uncontrolled diabetes, most likely due to noncompliance Held home regimen for now Started Semglee 20 units nightly and NovoLog sliding scale Monitor CBG, continue diabetic diet   Acute metabolic encephalopathy Most likely due to cocaine use disorder Currently patient is back to his baseline.  Continue supportive care.   Acute hypoxic respiratory failure due to CHF exacerbation Continue supplemental O2 inhalation and gradually wean off. Monitor pulse ox  Drug use disorder UDS positive for cocaine, drug abuse appearance counseling done.  Soft tissue prominence, incidental finding on CT chest Soft tissue prominence Incidental on the patient's CT scan Will need interval outpatient follow-up given his malignancy history Recommend to follow with PCP as an outpatient  Hemorrhoids, suppository order placed Peyronie's disease, patient was advised to follow-up with urology as an outpatient  Body mass index is 26.1 kg/m.  Interventions:  Diet: Diabetic diet DVT Prophylaxis: Therapeutic Anticoagulation with heparin IV infusion    Advance goals of care discussion: Full code  Family Communication: family was not present at bedside, at the  time of interview.  The pt provided permission to discuss medical plan with the family. Opportunity was given to ask question and all  questions were answered satisfactorily.   Disposition:  Pt is from Home, admitted with NSTEMI, CHF, needs cardiac workup, started on IV heparin infusion, which precludes a safe discharge. Discharge to Home, when cleared by cardiology.  Subjective: No significant event overnight, in the morning time patient was complaining of a headache, advised to ask RN to give him Tylenol.  Patient denies any chest pain or palpitation, no shortness of breath.  Overall feels improvement.  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  Physical Exam: General: NAD, lying comfortably Appear in no distress, affect appropriate Eyes: PERRLA ENT: Oral Mucosa Clear, moist  Neck: no JVD,  Cardiovascular: S1 and S2 Present, no Murmur,  Respiratory: good respiratory effort, Bilateral Air entry equal and Decreased, no Crackles, no wheezes Abdomen: Bowel Sound present, Soft and no tenderness,  Skin: no rashes Extremities: no Pedal edema, no calf tenderness Neurologic: without any new focal findings Gait not checked due to patient safety concerns  Vitals:   09/12/23 0530 09/12/23 0545 09/12/23 0600 09/12/23 0630  BP: (!) 129/110  (!) 129/102 (!) 130/105  Pulse: 87 93 90 91  Resp:  17  (!) 26  Temp:      TempSrc:      SpO2: 99% 98% 99% 99%  Weight:      Height:        Intake/Output Summary (Last 24 hours) at 09/12/2023 1147 Last data filed at 09/12/2023 1042 Gross per 24 hour  Intake 232.47 ml  Output 6200 ml  Net -5967.53 ml   Filed Weights   09/11/23 1853  Weight: 82.5 kg    Data Reviewed: I have personally reviewed and interpreted daily labs, tele strips, imagings as discussed above. I reviewed all nursing notes, pharmacy notes, vitals, pertinent old records I have discussed plan of care as described above with RN and patient/family.  CBC: Recent Labs  Lab 09/11/23 1520 09/11/23 2042 09/12/23 0758   WBC 8.2 8.1 8.7  NEUTROABS 5.9  --   --   HGB 14.8 15.3 15.7  HCT 46.0 47.9 48.3  MCV 91.8 92.6 91.7  PLT 267 284 280   Basic Metabolic Panel: Recent Labs  Lab 09/11/23 1520 09/11/23 2042 09/12/23 0758  NA 133* 134* 140  K 4.5 4.9 4.0  CL 102 100 105  CO2 22 23 26   GLUCOSE 269* 292* 111*  BUN 16 14 13   CREATININE 1.07 1.01 1.15  CALCIUM 8.5* 8.9 8.9  MG  --   --  2.1  PHOS  --   --  4.2    Studies: CT Angio Chest/Abd/Pel for Dissection W and/or Wo Contrast  Result Date: 09/11/2023 CLINICAL DATA:  Chest pain with breathing difficulties. Acute aortic syndrome suspected. EXAM: CT ANGIOGRAPHY CHEST, ABDOMEN AND PELVIS TECHNIQUE: Non-contrast CT of the chest was initially obtained. Multidetector CT imaging through the chest, abdomen and pelvis was performed using the standard protocol during bolus administration of intravenous contrast. Multiplanar reconstructed images and MIPs were obtained and reviewed to evaluate the vascular anatomy. RADIATION DOSE REDUCTION: This exam was performed according to the departmental dose-optimization program which includes automated exposure control, adjustment of the mA and/or kV according to patient size and/or use of iterative reconstruction technique. CONTRAST:  75mL OMNIPAQUE IOHEXOL 350 MG/ML SOLN COMPARISON:  Chest  CTA and abdominopelvic CT 11/16/2021. FINDINGS: CTA CHEST FINDINGS Cardiovascular: Pre contrast images demonstrate extensive coronary artery atherosclerosis. There are no displaced intimal calcifications within the thoracic aorta. Following contrast, there is no evidence of aortic dissection, aneurysm or intimal ulceration. There is no evidence of acute pulmonary embolism. The heart is mildly enlarged. Trace pericardial fluid. Mediastinum/Nodes: New ill-defined soft tissue thickening throughout the mediastinal fat with extension into the hilar regions. No discretely enlarged mediastinal lymph nodes are identified. The thyroid gland, trachea  and esophagus demonstrate no significant findings. Lungs/Pleura: Trace bilateral pleural effusions. No pneumothorax. There is new diffuse central airway thickening with septal and fissural thickening and patchy ground-glass opacities throughout both lungs. No confluent airspace disease or suspicious pulmonary nodularity. Musculoskeletal/Chest wall: No chest wall mass or suspicious osseous findings. Review of the MIP images confirms the above findings. CTA ABDOMEN AND PELVIS FINDINGS VASCULAR Aorta: Mild aortic atherosclerosis. Normal caliber aorta without aneurysm, dissection or significant stenosis. Celiac: Patent without evidence of aneurysm, dissection or significant stenosis. SMA: Patent without evidence of aneurysm, dissection or significant stenosis. Renals: Both renal arteries are patent without evidence of aneurysm, dissection or significant stenosis. IMA: Patent without evidence of aneurysm, dissection, vasculitis or significant stenosis. Inflow: Bilateral iliac atherosclerosis. Patent without evidence of aneurysm, dissection, vasculitis or significant stenosis. Veins: No obvious venous abnormality within the limitations of this arterial phase study. Review of the MIP images confirms the above findings. NON-VASCULAR FINDINGS Hepatobiliary: The liver is normal in density without suspicious focal abnormality. There is mild reflux of contrast into the IVC and hepatic veins. No evidence of gallstones, gallbladder wall thickening or biliary dilatation. Pancreas: Unremarkable. No pancreatic ductal dilatation or surrounding inflammatory changes. Spleen: Normal in size without focal abnormality. Adrenals/Urinary Tract: Both adrenal glands appear normal. The kidneys appear normal without evidence of urinary tract calculus, suspicious lesion or hydronephrosis. The urinary bladder is mildly distended without focal abnormality. Stomach/Bowel: No enteric contrast administered. The stomach appears unremarkable for its  degree of distension. No evidence of bowel wall thickening, distention or surrounding inflammatory change. Evidence of interval right hemicolectomy with ileocolonic anastomosis. No evidence of recurrent bowel obstruction. Lymphatic: Nodal assessment limited by paucity of retroperitoneal fat. No discretely enlarged abdominopelvic lymph nodes identified. Reproductive: The prostate gland and seminal vesicles appear unremarkable. Bilateral scrotal hydroceles noted. Other: No evidence of abdominal wall mass or hernia. No ascites. Postsurgical changes in the anterior abdominal wall. Musculoskeletal: No acute or significant osseous findings. Review of the MIP images confirms the above findings. IMPRESSION: 1. No evidence of acute aortic syndrome or acute pulmonary embolism. No acute vascular findings in the chest, abdomen or pelvis. 2. Coronary artery, aortic and iliac atherosclerosis. 3. New diffuse central airway thickening with septal and fissural thickening and patchy ground-glass opacities throughout both lungs, suspicious for pulmonary edema. Trace bilateral pleural effusions. 4. New ill-defined soft tissue thickening throughout the mediastinal fat with extension into the hilar regions, nonspecific. This could be secondary to edema or inflammation. No discretely enlarged lymph nodes identified. If the suspected pulmonary edema does not resolve with appropriate treatment, follow-up chest CT recommended to exclude atypical malignancy (especially if there is a history of malignancy as suggested by previous abdominal CT). 5. Interval right hemicolectomy with ileocolonic anastomosis. No evidence of recurrent bowel obstruction. Electronically Signed   By: Carey Bullocks M.D.   On: 09/11/2023 17:38   CT Head Wo Contrast  Result Date: 09/11/2023 CLINICAL DATA:  Mental status change. EXAM: CT HEAD WITHOUT CONTRAST TECHNIQUE: Contiguous axial  images were obtained from the base of the skull through the vertex without  intravenous contrast. RADIATION DOSE REDUCTION: This exam was performed according to the departmental dose-optimization program which includes automated exposure control, adjustment of the mA and/or kV according to patient size and/or use of iterative reconstruction technique. COMPARISON:  None Available. FINDINGS: Brain: No acute intracranial hemorrhage. No focal mass lesion. No CT evidence of acute infarction. No midline shift or mass effect. No hydrocephalus. Basilar cisterns are patent. Benign vascular space in the medial RIGHT parietal lobe. Vascular: No hyperdense vessel or unexpected calcification. Skull: Normal. Negative for fracture or focal lesion. Sinuses/Orbits: Chronic thickening of the sphenoid sinus walls. Frontal sinuses clear. Other: None. IMPRESSION: 1. No intracranial findings. 2. Chronic sphenoid sinus disease. Electronically Signed   By: Genevive Bi M.D.   On: 09/11/2023 16:09    Scheduled Meds:  atorvastatin  80 mg Oral Daily   diazepam  5 mg Intravenous Once   furosemide  40 mg Intravenous BID   insulin aspart  0-15 Units Subcutaneous Q4H   insulin glargine-yfgn  20 Units Subcutaneous QHS   losartan  25 mg Oral Daily   spironolactone  25 mg Oral Daily   Continuous Infusions:  heparin 1,300 Units/hr (09/12/23 1042)   PRN Meds: acetaminophen, morphine injection  Time spent: 55 minutes  Author: Gillis Santa. MD [facility] 09/12/2023 11:47 AM  To reach On-call, see care teams to locate the attending and reach out to them via [email or web address]. If 7PM-7AM, please contact night-coverage If you still have difficulty reaching the attending provider, please page the [facility] (Director on Call) for [facility] on amion for assistance.

## 2023-09-12 NOTE — Progress Notes (Signed)
ANTICOAGULATION CONSULT NOTE -  Pharmacy Consult for IV heparin Indication:  NSTEMI  Allergies  Allergen Reactions   Penicillins Other (See Comments)    Patient unsure of allergy    Patient Measurements: Height: 5\' 10"  (177.8 cm) Weight: 82.5 kg (181 lb 14.1 oz) IBW/kg (Calculated) : 73 Heparin Dosing Weight: 82.5 kg  Vital Signs: Temp: 97 F (36.1 C) (09/29 0501) Temp Source: Axillary (09/29 0501) BP: 130/105 (09/29 0630) Pulse Rate: 91 (09/29 0630)  Labs: Recent Labs    09/11/23 1520 09/11/23 1746 09/11/23 1803 09/11/23 2042 09/11/23 2123 09/12/23 0052 09/12/23 0758  HGB 14.8  --   --  15.3  --   --  15.7  HCT 46.0  --   --  47.9  --   --  48.3  PLT 267  --   --  284  --   --  280  APTT  --   --  28  --   --   --   --   LABPROT  --   --  15.6*  --   --   --   --   INR  --   --  1.2  --   --   --   --   HEPARINUNFRC  --   --   --   --  <0.10* 0.18* 0.41  CREATININE 1.07  --   --  1.01  --   --  1.15  CKTOTAL  --   --   --  601*  --   --   --   TROPONINIHS 276* 2,400*  --   --   --   --  9,101*    Estimated Creatinine Clearance: 78.5 mL/min (by C-G formula based on SCr of 1.15 mg/dL).   Medical History: Past Medical History:  Diagnosis Date   Abrasion of toe with infection, right, subsequent encounter 03/27/2021   CHF (congestive heart failure) (HCC)    Diabetes mellitus without complication (HCC)    Hypertension     Medications:  No prior to admission anticoagulation per my chart review  Assessment: 52 year old male presenting with chest pain. Troponins elevated at 9K. NO DOAC PTA. UDS positive for cocaine.   09/29 0758 HL 0.41    Goal of Therapy:  Heparin level 0.3-0.7 units/ml Monitor platelets by anticoagulation protocol: Yes   Plan:  Heparin level is therapeutic. Will continue heparin infusion at 1300 units/hr. Recheck heparin level in 6 hours. CBC daily while on heparin.    Ronnald Ramp, PharmD, BCPS Clinical Pharmacist   09/12/2023 9:13 AM

## 2023-09-13 ENCOUNTER — Encounter
Admission: EM | Disposition: A | Discharge status: Home / Self Care | Payer: Self-pay | Source: Home / Self Care | Attending: Student

## 2023-09-13 ENCOUNTER — Inpatient Hospital Stay
Admit: 2023-09-13 | Discharge: 2023-09-13 | Disposition: A | Discharge status: Home / Self Care | Payer: 59 | Attending: Cardiovascular Disease | Admitting: Cardiovascular Disease

## 2023-09-13 DIAGNOSIS — I255 Ischemic cardiomyopathy: Secondary | ICD-10-CM

## 2023-09-13 DIAGNOSIS — I2510 Atherosclerotic heart disease of native coronary artery without angina pectoris: Secondary | ICD-10-CM

## 2023-09-13 DIAGNOSIS — I5043 Acute on chronic combined systolic (congestive) and diastolic (congestive) heart failure: Secondary | ICD-10-CM

## 2023-09-13 DIAGNOSIS — I214 Non-ST elevation (NSTEMI) myocardial infarction: Secondary | ICD-10-CM

## 2023-09-13 HISTORY — PX: LEFT HEART CATH AND CORONARY ANGIOGRAPHY: CATH118249

## 2023-09-13 HISTORY — PX: CORONARY STENT INTERVENTION: CATH118234

## 2023-09-13 LAB — CBC
HCT: 42.5 % (ref 39.0–52.0)
Hemoglobin: 14.4 g/dL (ref 13.0–17.0)
MCH: 30 pg (ref 26.0–34.0)
MCHC: 33.9 g/dL (ref 30.0–36.0)
MCV: 88.5 fL (ref 80.0–100.0)
Platelets: 234 10*3/uL (ref 150–400)
RBC: 4.8 MIL/uL (ref 4.22–5.81)
RDW: 13.8 % (ref 11.5–15.5)
WBC: 5.9 10*3/uL (ref 4.0–10.5)
nRBC: 0 % (ref 0.0–0.2)

## 2023-09-13 LAB — BASIC METABOLIC PANEL
Anion gap: 9 (ref 5–15)
BUN: 25 mg/dL — ABNORMAL HIGH (ref 6–20)
CO2: 27 mmol/L (ref 22–32)
Calcium: 8.5 mg/dL — ABNORMAL LOW (ref 8.9–10.3)
Chloride: 101 mmol/L (ref 98–111)
Creatinine, Ser: 1.25 mg/dL — ABNORMAL HIGH (ref 0.61–1.24)
GFR, Estimated: >60 mL/min (ref >60)
Glucose, Bld: 286 mg/dL — ABNORMAL HIGH (ref 70–99)
Potassium: 3.9 mmol/L (ref 3.5–5.1)
Sodium: 137 mmol/L (ref 135–145)

## 2023-09-13 LAB — ECHOCARDIOGRAM COMPLETE
AR max vel: 2.1 cm2
AV Area VTI: 2.62 cm2
AV Area mean vel: 2.21 cm2
AV Mean grad: 1 mm[Hg]
AV Peak grad: 2.7 mm[Hg]
Ao pk vel: 0.81 m/s
Area-P 1/2: 5.42 cm2
Calc EF: 25 %
Height: 70 in
S' Lateral: 5.4 cm
Single Plane A2C EF: 23.7 %
Single Plane A4C EF: 20.7 %
Weight: 2910.07 [oz_av]

## 2023-09-13 LAB — POCT ACTIVATED CLOTTING TIME
Activated Clotting Time: 287 s
Activated Clotting Time: 238 s
Activated Clotting Time: 262 s

## 2023-09-13 LAB — GLUCOSE, CAPILLARY
Glucose-Capillary: 108 mg/dL — ABNORMAL HIGH (ref 70–99)
Glucose-Capillary: 117 mg/dL — ABNORMAL HIGH (ref 70–99)
Glucose-Capillary: 193 mg/dL — ABNORMAL HIGH (ref 70–99)
Glucose-Capillary: 220 mg/dL — ABNORMAL HIGH (ref 70–99)
Glucose-Capillary: 235 mg/dL — ABNORMAL HIGH (ref 70–99)
Glucose-Capillary: 260 mg/dL — ABNORMAL HIGH (ref 70–99)
Glucose-Capillary: 412 mg/dL — ABNORMAL HIGH (ref 70–99)
Glucose-Capillary: 491 mg/dL — ABNORMAL HIGH (ref 70–99)

## 2023-09-13 LAB — PHOSPHORUS: Phosphorus: 4.2 mg/dL (ref 2.5–4.6)

## 2023-09-13 LAB — HEPARIN LEVEL (UNFRACTIONATED): Heparin Unfractionated: 0.35 [IU]/mL (ref 0.30–0.70)

## 2023-09-13 LAB — CALCIUM, IONIZED: Calcium, Ionized, Serum: 4.9 mg/dL (ref 4.5–5.6)

## 2023-09-13 LAB — MAGNESIUM: Magnesium: 2 mg/dL (ref 1.7–2.4)

## 2023-09-13 SURGERY — LEFT HEART CATH AND CORONARY ANGIOGRAPHY
Anesthesia: Moderate Sedation | Laterality: Right

## 2023-09-13 MED ORDER — CLOPIDOGREL BISULFATE 75 MG PO TABS
ORAL_TABLET | ORAL | Status: AC
  Filled 2023-09-13: qty 8

## 2023-09-13 MED ORDER — ONDANSETRON HCL 4 MG/2ML IJ SOLN
INTRAMUSCULAR | Status: DC | PRN
  Administered 2023-09-13: 4 mg via INTRAVENOUS

## 2023-09-13 MED ORDER — METOPROLOL SUCCINATE ER 25 MG PO TB24: 25.0000 mg | ORAL_TABLET | Freq: Every day | ORAL | Status: DC

## 2023-09-13 MED ORDER — FENTANYL CITRATE (PF) 100 MCG/2ML IJ SOLN
INTRAMUSCULAR | Status: DC | PRN
  Administered 2023-09-13: 25 ug via INTRAVENOUS

## 2023-09-13 MED ORDER — ENOXAPARIN SODIUM 40 MG/0.4ML IJ SOSY
40.0000 mg | PREFILLED_SYRINGE | INTRAMUSCULAR | Status: DC
  Administered 2023-09-14: 40 mg via SUBCUTANEOUS
  Filled 2023-09-13: qty 0.4

## 2023-09-13 MED ORDER — CARVEDILOL 6.25 MG PO TABS
6.2500 mg | ORAL_TABLET | Freq: Two times a day (BID) | ORAL | Status: DC
  Administered 2023-09-13 – 2023-09-14 (×2): 6.25 mg via ORAL
  Filled 2023-09-13 (×2): qty 1

## 2023-09-13 MED ORDER — SODIUM CHLORIDE 0.9% FLUSH: 3.0000 mL | INTRAVENOUS | Status: DC | PRN

## 2023-09-13 MED ORDER — ONDANSETRON HCL 4 MG/2ML IJ SOLN: 4.0000 mg | Freq: Four times a day (QID) | INTRAMUSCULAR | Status: DC | PRN

## 2023-09-13 MED ORDER — NITROGLYCERIN IN D5W 200-5 MCG/ML-% IV SOLN: 0.0000 ug/min | INTRAVENOUS | Status: DC

## 2023-09-13 MED ORDER — IOHEXOL 300 MG/ML  SOLN
INTRAMUSCULAR | Status: DC | PRN
  Administered 2023-09-13: 120 mL

## 2023-09-13 MED ORDER — CHLORHEXIDINE GLUCONATE CLOTH 2 % EX PADS
6.0000 | MEDICATED_PAD | Freq: Every day | CUTANEOUS | Status: DC
  Administered 2023-09-13 – 2023-09-14 (×2): 6 via TOPICAL

## 2023-09-13 MED ORDER — NITROGLYCERIN IN D5W 200-5 MCG/ML-% IV SOLN
INTRAVENOUS | Status: DC | PRN
  Administered 2023-09-13: 10 ug/min via INTRAVENOUS

## 2023-09-13 MED ORDER — MIDAZOLAM HCL 2 MG/2ML IJ SOLN
INTRAMUSCULAR | Status: DC | PRN
  Administered 2023-09-13: 1 mg via INTRAVENOUS

## 2023-09-13 MED ORDER — HEPARIN SODIUM (PORCINE) 1000 UNIT/ML IJ SOLN
INTRAMUSCULAR | Status: AC
  Filled 2023-09-13: qty 10

## 2023-09-13 MED ORDER — ONDANSETRON HCL 4 MG/2ML IJ SOLN
INTRAMUSCULAR | Status: AC
  Filled 2023-09-13: qty 2

## 2023-09-13 MED ORDER — FUROSEMIDE 10 MG/ML IJ SOLN: 20.0000 mg | Freq: Every day | INTRAMUSCULAR | Status: DC

## 2023-09-13 MED ORDER — POLYETHYLENE GLYCOL 3350 17 G PO PACK
17.0000 g | PACK | Freq: Every day | ORAL | Status: DC
  Administered 2023-09-13: 17 g via ORAL
  Filled 2023-09-13 (×2): qty 1

## 2023-09-13 MED ORDER — FENTANYL CITRATE (PF) 100 MCG/2ML IJ SOLN: INTRAMUSCULAR | Status: DC | PRN

## 2023-09-13 MED ORDER — HEPARIN (PORCINE) IN NACL 1000-0.9 UT/500ML-% IV SOLN
INTRAVENOUS | Status: AC
  Filled 2023-09-13: qty 1000

## 2023-09-13 MED ORDER — NITROGLYCERIN 1 MG/10 ML FOR IR/CATH LAB
INTRA_ARTERIAL | Status: DC | PRN
  Administered 2023-09-13: 100 ug via INTRACORONARY

## 2023-09-13 MED ORDER — SODIUM CHLORIDE 0.9 % IV SOLN: 250.0000 mL | INTRAVENOUS | Status: DC | PRN

## 2023-09-13 MED ORDER — HEPARIN SODIUM (PORCINE) 1000 UNIT/ML IJ SOLN
INTRAMUSCULAR | Status: DC | PRN
  Administered 2023-09-13: 2000 [IU] via INTRAVENOUS
  Administered 2023-09-13: 8000 [IU] via INTRAVENOUS
  Administered 2023-09-13: 2000 [IU] via INTRAVENOUS

## 2023-09-13 MED ORDER — HEPARIN (PORCINE) IN NACL 1000-0.9 UT/500ML-% IV SOLN
INTRAVENOUS | Status: AC
  Filled 2023-09-13: qty 500

## 2023-09-13 MED ORDER — NITROGLYCERIN 1 MG/10 ML FOR IR/CATH LAB
INTRA_ARTERIAL | Status: AC
  Filled 2023-09-13: qty 10

## 2023-09-13 MED ORDER — NITROGLYCERIN IN D5W 200-5 MCG/ML-% IV SOLN
INTRAVENOUS | Status: AC
  Filled 2023-09-13: qty 250

## 2023-09-13 MED ORDER — CLOPIDOGREL BISULFATE 75 MG PO TABS
ORAL_TABLET | ORAL | Status: DC | PRN
  Administered 2023-09-13: 600 mg via ORAL

## 2023-09-13 MED ORDER — SODIUM CHLORIDE 0.9% FLUSH
3.0000 mL | Freq: Two times a day (BID) | INTRAVENOUS | Status: DC
  Administered 2023-09-13: 3 mL via INTRAVENOUS

## 2023-09-13 MED ORDER — DAPAGLIFLOZIN PROPANEDIOL 10 MG PO TABS
10.0000 mg | ORAL_TABLET | Freq: Every day | ORAL | Status: DC
  Administered 2023-09-14: 10 mg via ORAL
  Filled 2023-09-13 (×2): qty 1

## 2023-09-13 MED ORDER — LOSARTAN POTASSIUM 25 MG PO TABS: 25.0000 mg | ORAL_TABLET | Freq: Every day | ORAL | Status: DC

## 2023-09-13 MED ORDER — HEPARIN (PORCINE) IN NACL 1000-0.9 UT/500ML-% IV SOLN
INTRAVENOUS | Status: DC | PRN
  Administered 2023-09-13: 500 mL

## 2023-09-13 MED ORDER — ASPIRIN 81 MG PO CHEW: 81.0000 mg | CHEWABLE_TABLET | ORAL | Status: DC

## 2023-09-13 MED ORDER — IOHEXOL 300 MG/ML  SOLN
INTRAMUSCULAR | Status: DC | PRN
  Administered 2023-09-13: 60 mL

## 2023-09-13 MED ORDER — BISACODYL 5 MG PO TBEC
5.0000 mg | DELAYED_RELEASE_TABLET | Freq: Once | ORAL | Status: AC
  Administered 2023-09-13: 5 mg via ORAL
  Filled 2023-09-13: qty 1

## 2023-09-13 MED ORDER — SODIUM CHLORIDE 0.9% FLUSH
3.0000 mL | Freq: Two times a day (BID) | INTRAVENOUS | Status: DC
  Administered 2023-09-14 (×2): 3 mL via INTRAVENOUS

## 2023-09-13 MED ORDER — FENTANYL CITRATE (PF) 100 MCG/2ML IJ SOLN
INTRAMUSCULAR | Status: AC
  Filled 2023-09-13: qty 2

## 2023-09-13 MED ORDER — LIDOCAINE HCL (PF) 1 % IJ SOLN
INTRAMUSCULAR | Status: DC | PRN
  Administered 2023-09-13: 20 mL

## 2023-09-13 MED ORDER — SODIUM CHLORIDE 0.9 % IV SOLN: INTRAVENOUS | Status: DC

## 2023-09-13 MED ORDER — INSULIN ASPART 100 UNIT/ML IJ SOLN
10.0000 [IU] | Freq: Once | INTRAMUSCULAR | Status: AC
  Administered 2023-09-13: 10 [IU] via INTRAVENOUS
  Filled 2023-09-13 (×2): qty 0.1

## 2023-09-13 MED ORDER — FENTANYL CITRATE (PF) 100 MCG/2ML IJ SOLN
INTRAMUSCULAR | Status: DC | PRN
  Administered 2023-09-13 (×2): 25 ug via INTRAVENOUS

## 2023-09-13 MED ORDER — MIDAZOLAM HCL 2 MG/2ML IJ SOLN
INTRAMUSCULAR | Status: AC
  Filled 2023-09-13: qty 2

## 2023-09-13 MED ORDER — HEPARIN (PORCINE) IN NACL 1000-0.9 UT/500ML-% IV SOLN
INTRAVENOUS | Status: DC | PRN
  Administered 2023-09-13 (×2): 500 mL

## 2023-09-13 MED ORDER — CLOPIDOGREL BISULFATE 75 MG PO TABS
75.0000 mg | ORAL_TABLET | Freq: Every day | ORAL | Status: DC
  Administered 2023-09-14: 75 mg via ORAL
  Filled 2023-09-13: qty 1

## 2023-09-13 SURGICAL SUPPLY — 27 items
BALLN TREK RX 2.5X12 (BALLOONS) ×2
BALLN ~~LOC~~ EUPHORA RX 3.25X15 (BALLOONS) ×2
BALLN ~~LOC~~ TREK NEO RX 2.75X15 (BALLOONS) IMPLANT
BALLOON TREK RX 2.5X12 (BALLOONS) IMPLANT
BALLOON ~~LOC~~ EUPHORA RX 3.25X15 (BALLOONS) IMPLANT
CATH INFINITI 5FR MULTPACK ANG (CATHETERS) IMPLANT
CATH LAUNCHER 6FR EBU3.5 (CATHETERS) IMPLANT
CATH TELESCOPE 6F GEC (CATHETERS) IMPLANT
CATH VISTA GUIDE 6FR XB3.5 (CATHETERS) IMPLANT
DEVICE CLOSURE MYNXGRIP 6/7F (Vascular Products) IMPLANT
DRAPE BRACHIAL (DRAPES) IMPLANT
KIT ENCORE 26 ADVANTAGE (KITS) IMPLANT
NDL PERC 18GX7CM (NEEDLE) IMPLANT
NEEDLE PERC 18GX7CM (NEEDLE) ×2 IMPLANT
PACK CARDIAC CATH (CUSTOM PROCEDURE TRAY) ×2 IMPLANT
PROTECTION STATION PRESSURIZED (MISCELLANEOUS) ×2
SET ATX-X65L (MISCELLANEOUS) IMPLANT
SHEATH AVANTI 5FR X 11CM (SHEATH) IMPLANT
SHEATH AVANTI 6FR X 11CM (SHEATH) IMPLANT
STATION PROTECTION PRESSURIZED (MISCELLANEOUS) IMPLANT
STENT ONYX FRONTIER 3.0X18 (Permanent Stent) IMPLANT
STENT ONYX FRONTIER 3.0X22 (Permanent Stent) IMPLANT
TUBING CIL FLEX 10 FLL-RA (TUBING) IMPLANT
WIRE EMERALD 3MM-J .035X150CM (WIRE) IMPLANT
WIRE RUNTHROUGH .014X180CM (WIRE) IMPLANT
WIRE RUNTHROUGH .014X300CM (WIRE) IMPLANT
WIRE RUNTHROUGH IZANAI 014 180 (WIRE) IMPLANT

## 2023-09-13 NOTE — TOC Initial Note (Addendum)
Transition of Care [facility] - Initial/Assessment Note    Patient Details  Name: Joshua [name] MRN: [id number] Date of Birth: [birth year]  [facility]) CM/SW Contact:    Truddie Hidden, RN Phone Number: 09/13/2023, 12:52 PM  Clinical Narrative:           Spoke with patient at bedside.  Admitted for: NSTEMI Admitted from: Home Pharmacy: [facility] Current home health/prior home health/DME: None  Spoke with patient regarding housing "I live place to place."  He is unsure if he can return to previous environment.  RNCM advised shelter information can be provided he stated "They don't want me back over there." He is agreeable to receive housing information. Patient gets his medications at the [facility] on [address]. He denies issues getting his medication He stated he has to pay a few dollars for them from time to time. He also verbalized his problem is not taking his medications and not seeing a doctor in a long time. "That's why I'm here." RNCM advised of other resources. Patient inquired about food stamps. He was advised to follow up with [facility]. He stated he had completed the application however a dispute with his cousin is preventing him from being allowed access to the mailbox. He may need transportation arranged  at discharge.   Resources for food, PCP list,  housing, and transportation added to AVS.        Patient Goals and CMS Choice            Expected Discharge Plan and Services                                              Prior Living Arrangements/Services                       Activities of Daily Living   ADL Screening (condition at time of admission) Does the patient have a NEW difficulty with bathing/dressing/toileting/self-feeding that is expected to last >3 days?: No Does the patient have a NEW difficulty with getting in/out of bed, walking, or climbing stairs that is expected to last >3 days?:  No Does the patient have a NEW difficulty with communication that is expected to last >3 days?: No Is the patient deaf or have difficulty hearing?: No Does the patient have difficulty seeing, even when wearing glasses/contacts?: No Does the patient have difficulty concentrating, remembering, or making decisions?: No  Permission Sought/Granted                  Emotional Assessment              Admission diagnosis:  Cocaine abuse (HCC) [F14.10] NSTEMI (non-ST elevated myocardial infarction) (HCC) [I21.4] NSTEMI, initial episode of care [facility]) [I21.4] Patient Active Problem List   Diagnosis Date Noted   NSTEMI (non-ST elevated myocardial infarction) (HCC) 09/11/2023   Acute on chronic combined systolic and diastolic CHF (congestive heart failure) (HCC) 09/11/2023   Soft tissue complaint 09/11/2023   Hypocalcemia 09/11/2023   Hyponatremia 09/11/2023   Cocaine abuse with cocaine-induced mood disorder (HCC) 12/18/2022   Colonic mass    Iron deficiency anemia due to chronic blood loss    Colonic obstruction (HCC) 10/29/2021   Acute osteomyelitis of toe, right (HCC) 03/24/2021   Community acquired pneumonia 03/11/2021   CAD (coronary artery disease) 03/11/2021  Sepsis (HCC) 03/11/2021   Chest pain 03/11/2021   Essential hypertension 03/11/2021   DM (diabetes mellitus), type 2 (HCC) 03/11/2021   Tobacco abuse 03/11/2021   Polysubstance abuse (HCC)    Osteomyelitis of great toe of right foot (HCC) 02/18/2021   Malnutrition of moderate degree 02/14/2021   Cocaine abuse (HCC) 02/13/2021   Homeless 02/13/2021   Cellulitis of great toe of right foot 02/12/2021   Diabetic foot ulcer (HCC) 02/12/2021   Lactic acidosis 02/12/2021   Acute metabolic encephalopathy 02/12/2021   Chronic systolic CHF (congestive heart failure) (HCC) 02/12/2021   SIRS (systemic inflammatory response syndrome) (HCC) 10/04/2020   Acute hypoxic respiratory failure (HCC) 07/15/2020   Multifocal pneumonia  07/15/2020   History of MI (myocardial infarction) 07/15/2020   Symptomatic anemia 07/15/2020   Acute ST elevation myocardial infarction (STEMI) involving left anterior descending (LAD) coronary artery (HCC) 09/09/2018   STEMI involving left anterior descending coronary artery (HCC) 09/09/2018   Hypoglycemia 06/02/2016   PCP:  System, Provider Not In Pharmacy:   [facility] [address] ([address]),  - [address] [place] [address] Phone: [phone] Fax: [phone]  [facility] - [place], [place] - [address] [place] [address] Phone: [phone] Fax: [phone]  [facility] - [place], [place] - [place], [place] - [address] [place] [address] Phone: [phone] Fax: [phone]     Social Determinants of Health (SDOH) Social History: SDOH Screenings   Food Insecurity: Food Insecurity Present (09/12/2023)  Housing: Medium Risk (09/12/2023)  Transportation Needs: Unmet Transportation Needs (09/12/2023)  Utilities: Not At Risk (09/12/2023)  Financial Resource Strain: High Risk (04/10/2022)   Received from [facility], [facility]  Stress: Stress Concern Present (12/19/2021)   Received from [facility], [facility]  Tobacco Use: High Risk (09/12/2023)  Health Literacy: Medium Risk (12/19/2021)   Received from [facility], [facility]   SDOH Interventions:     Readmission Risk Interventions    10/31/2021    1:01 PM  Readmission Risk Prevention Plan  Transportation Screening Complete  Medication Review (RN Care Manager) Complete  PCP or Specialist appointment within 3-5 days of discharge Complete  SW Recovery Care/Counseling Consult Complete  Palliative Care Screening Not Applicable  Skilled Nursing Facility Not Applicable

## 2023-09-13 NOTE — Progress Notes (Signed)
*  PRELIMINARY RESULTS* Echocardiogram 2D Echocardiogram has been performed.  Joshua [name] 09/13/2023, 12:30 PM

## 2023-09-13 NOTE — Progress Notes (Signed)
SUBJECTIVE: This is a 52 year old African-American male with history of colon cancer and PCI and stenting in the past with severe LV dysfunction presented with non-STEMI with peak troponin 2400.  He has some shortness of breath and tightness in the chest.   Vitals:   09/12/23 2002 09/12/23 2350 09/13/23 0432 09/13/23 0838  BP: 103/82 112/88 108/75 (!) 111/94  Pulse: (!) 103 95 90 91  Resp: 18 18 18 18   Temp: 98.3 F (36.8 C) 98.5 F (36.9 C) (!) 97.5 F (36.4 C) 98.3 F (36.8 C)  TempSrc:    Oral  SpO2: 100% 100% 95% 100%  Weight:      Height:        Intake/Output Summary (Last 24 hours) at 09/13/2023 1046 Last data filed at 09/13/2023 0700 Gross per 24 hour  Intake 989.26 ml  Output 1550 ml  Net -560.74 ml    LABS: Basic Metabolic Panel: Recent Labs    09/12/23 0758 09/13/23 0422  NA 140 137  K 4.0 3.9  CL 105 101  CO2 26 27  GLUCOSE 111* 286*  BUN 13 25*  CREATININE 1.15 1.25*  CALCIUM 8.9 8.5*  MG 2.1 2.0  PHOS 4.2 4.2   Liver Function Tests: Recent Labs    09/11/23 1520 09/11/23 2042  AST 33 61*  ALT 22 23  ALKPHOS 84 86  BILITOT 1.3* 1.2  PROT 7.6 7.7  ALBUMIN 4.0 4.1   Recent Labs    09/11/23 1520  LIPASE 20   CBC: Recent Labs    09/11/23 1520 09/11/23 2042 09/12/23 0758 09/13/23 0422  WBC 8.2   < > 8.7 5.9  NEUTROABS 5.9  --   --   --   HGB 14.8   < > 15.7 14.4  HCT 46.0   < > 48.3 42.5  MCV 91.8   < > 91.7 88.5  PLT 267   < > 280 234   < > = values in this interval not displayed.   Cardiac Enzymes: Recent Labs    09/11/23 2042  CKTOTAL 601*   BNP: Invalid input(s): "POCBNP" D-Dimer: No results for input(s): "DDIMER" in the last 72 hours. Hemoglobin A1C: Recent Labs    09/11/23 2042  HGBA1C 10.1*   Fasting Lipid Panel: No results for input(s): "CHOL", "HDL", "LDLCALC", "TRIG", "CHOLHDL", "LDLDIRECT" in the last 72 hours. Thyroid Function Tests: Recent Labs    09/12/23 0758  TSH 0.828   Anemia Panel: No results  for input(s): "VITAMINB12", "FOLATE", "FERRITIN", "TIBC", "IRON", "RETICCTPCT" in the last 72 hours.   PHYSICAL EXAM General: Well developed, well nourished, in no acute distress HEENT:  Normocephalic and atramatic Neck:  No JVD.  Lungs: Clear bilaterally to auscultation and percussion. Heart: HRRR . Normal S1 and S2 without gallops or murmurs.  Abdomen: Bowel sounds are positive, abdomen soft and non-tender  Msk:  Back normal, normal gait. Normal strength and tone for age. Extremities: No clubbing, cyanosis or edema.   Neuro: Alert and oriented X 3. Psych:  Good affect, responds appropriately  TELEMETRY: Normal sinus rhythm  ASSESSMENT AND PLAN: Non-STEMI with history of PCI and stenting in the past and severe LV dysfunction and noncompliant with his medication.  Patient had a peak troponin of 2400 and has shortness of breath and chest pain.  Thus we will schedule the patient for cardiac catheterization today.  He has been scheduled at 1:30 PM.   ICD-10-CM   1. NSTEMI, initial episode of care (HCC)  I21.4  2. Cocaine abuse (HCC)  F14.10       Principal Problem:   NSTEMI (non-ST elevated myocardial infarction) (HCC) Active Problems:   Acute hypoxic respiratory failure (HCC)   Acute metabolic encephalopathy   Essential hypertension   DM (diabetes mellitus), type 2 (HCC)   Acute on chronic combined systolic and diastolic CHF (congestive heart failure) (HCC)   Soft tissue complaint   Hypocalcemia   Hyponatremia    Joshua Blackwater, MD, [facility] 09/13/2023 10:46 AM

## 2023-09-13 NOTE — Inpatient Diabetes Management (Signed)
Inpatient Diabetes Program Recommendations  AACE/ADA: New Consensus Statement on Inpatient Glycemic Control (2015)  Target Ranges:  Prepandial:   less than 140 mg/dL      Peak postprandial:   less than 180 mg/dL (1-2 hours)      Critically ill patients:  140 - 180 mg/dL    Latest Reference Range & Units 09/11/23 20:42  Hemoglobin A1C 4.8 - 5.6 % 10.1 (H)  243 mg/dl  (H): Data is abnormally high  Latest Reference Range & Units 09/12/23 00:25 09/12/23 03:56 09/12/23 08:22 09/12/23 12:19 09/12/23 16:17 09/12/23 20:18 09/12/23 22:12  Glucose-Capillary 70 - 99 mg/dL 865 (H) 784 (H) 91 84 696 (H)  11 units Novolog  362 (H)  15 units Novolog  186 (H)   20 units Semglee  (H): Data is abnormally high  Latest Reference Range & Units 09/12/23 23:53 09/13/23 04:33 09/13/23 08:39  Glucose-Capillary 70 - 99 mg/dL 295 (H)  2 units Novolog  260 (H)  8 units Novolog  117 (H)  (H): Data is abnormally high  Admit with: NSTEMI--Likely cocaine induced in the setting of known coronary artery disease--UDS is positive    History: DM, CHF  Home DM Meds: Lantus 40 units Daily        Metformin 500 mg Daily  Current Orders: Semglee 20 units at bedtime      Novolog Moderate Correction Scale/ SSI (0-15 units) Q4 hours   MD- Pt has NOT been taking any Diabetes meds consistently for over 1 year now.  Has been taking friend's diabetes meds and insulin.  He will need Rxs for Diabetes meds at time of discharge  Pt stated to me he has Medicaid coverage    Met with pt early this afternoon to review current A1c, meds at home.  Pt told me he is essentially homeless and has his belongings scattered about at various friend's homes.  Takes other peoples diabetes meds (including insulin) from time to time.  States he hasn't seen a doctor in a long time and used to take insulin and [place].  Told me the "Marcelline Deist worked good".  Uses friend's insulin but doesn't know the name--takes various amounts from time  to time.  I told pt to please stop using other people's insulin because of the risk for exposure to infection or disease from blood borne pathogens.  I also advised pt to stop taking other people's oral DM meds.  Pt states he has CBG meter but no strips--Then he told me he doesn't know which friend's house he left his meter.  He asked me questions about his previous colon cancer and I told him I was not able to answer those questions but would ask the RN to address that with him--I also asked pt to address his colon concerns with the Attending MD.  Pt kept telling me he just needs stable housing and then he can take meds on a regular basis.  Note that [facility] team has seen pt today.    Spoke with patient about his current A1c of 10.1%.  Explained what an A1c is and what it measures.  Reminded patient that his goal A1c is 7% or less per ADA standards to prevent both acute and long-term complications.  Explained to patient the extreme importance of good glucose control at home.  Encouraged patient to check his CBGs at least bid at home (fasting and another check within the day) and to record all CBGs in a logbook for his PCP to review.    --  Will follow patient during hospitalization--  Ambrose Finland RN, MSN, CDCES Diabetes Coordinator Inpatient Glycemic Control Team Team Pager: [phone] (8a-5p)

## 2023-09-13 NOTE — Progress Notes (Signed)
ANTICOAGULATION CONSULT NOTE -  Pharmacy Consult for IV heparin Indication:  NSTEMI  Allergies  Allergen Reactions   Penicillins Other (See Comments)    Patient unsure of allergy    Patient Measurements: Height: 5\' 10"  (177.8 cm) Weight: 82.5 kg (181 lb 14.1 oz) IBW/kg (Calculated) : 73 Heparin Dosing Weight: 82.5 kg  Vital Signs: Temp: 97.5 F (36.4 C) (09/30 0432) BP: 108/75 (09/30 0432) Pulse Rate: 90 (09/30 0432)  Labs: Recent Labs    09/11/23 1520 09/11/23 1746 09/11/23 1803 09/11/23 2042 09/11/23 2123 09/12/23 0758 09/12/23 1545 09/12/23 2243 09/13/23 0422  HGB 14.8  --   --  15.3  --  15.7  --   --  14.4  HCT 46.0  --   --  47.9  --  48.3  --   --  42.5  PLT 267  --   --  284  --  280  --   --  234  APTT  --   --  28  --   --   --   --   --   --   LABPROT  --   --  15.6*  --   --   --   --   --   --   INR  --   --  1.2  --   --   --   --   --   --   HEPARINUNFRC  --   --   --   --    < > 0.41 0.28* 0.51 0.35  CREATININE 1.07  --   --  1.01  --  1.15  --   --  1.25*  CKTOTAL  --   --   --  601*  --   --   --   --   --   TROPONINIHS 276* 2,400*  --   --   --  9,101*  --   --   --    < > = values in this interval not displayed.    Estimated Creatinine Clearance: 72.2 mL/min (A) (by C-G formula based on SCr of 1.25 mg/dL (H)).   Medical History: Past Medical History:  Diagnosis Date   Abrasion of toe with infection, right, subsequent encounter 03/27/2021   CHF (congestive heart failure) (HCC)    Diabetes mellitus without complication (HCC)    Hypertension     Medications:  No prior to admission anticoagulation per my chart review  Assessment: 52 year old male presenting with chest pain. Troponins elevated at 9K. NO DOAC PTA. UDS positive for cocaine.   09/29 0758 HL 0.41  09/29 2243 HL 0.51, therapeutic X 1  09/30 0422 HL 0.35, therapeutic X 2    Goal of Therapy:  Heparin level 0.3-0.7 units/ml Monitor platelets by anticoagulation protocol:  Yes   Plan: 9/30: HL @ 0422 = 0.35, therapeutic X 2 - Will continue pt on current rate and recheck HL on 10/01 with AM labs  Lael Wetherbee D, PharmD Clinical Pharmacist  09/13/2023 5:51 AM

## 2023-09-13 NOTE — Progress Notes (Signed)
[facility] Progress Note  Patient: Joshua [name]    ZOX:[id number]  DOA: 09/11/2023     Date of Service: the patient was seen and examined on 09/13/2023  Chief Complaint  Patient presents with   Chest Pain   Altered Mental Status   Shortness of Breath   Brief hospital course: 52 year old male with a PMH of CAD s/p STEMI in 2019 with PCI, chronic mixed systolic and diastolic heart failure, colon cancer s/p colectomy, IDDM T2, PAD, who presented at [facility] ED via EMS  due to c/o severe chest pain.  In the ED patient was somnolent, opened eyes to vigorous sternal rub but did not provide any useful history.  Upon signout from ER physician and chart review the patient's troponin is notably elevated at 276, past medical records reviewed and summarized including the echocardiogram from 2022 EF was 25% that time grade II diastolic function, UDS was notable positive for cocaine, past medical records reviewed and summarized of the patient's 2019 catheterization:  Multivessel coronary disease100% mid LAD TIMI 0 was the culprit lesion IRASerial disease in the mid circumflex multiple lesions in the mid RCA and distalDistal 100% RCA with collaterals left to rightSuccessful PCI and stent of mid 100% LAD which was the IRADES stent deployed 3.0 x 23 mm Xience [facility] consulted for admission and further management as below.  Assessment and Plan:  Non-STEMI History of CAD s/p stents.  Patient presented with chest pain, most likely demand ischemia due to cocaine abuse Patient is noncompliant with medications. Patient does not take aspirin at home Aspirin 300 mg rectal suppository was given in the ED Heparin IV infusion was started Troponin peaked 24K, gradually trending down  Continue to monitor on telemetry Started aspirin 81 mg p.o. daily Cardiology consulted, recommended cardiac cath which is planned today. 9/30 TTE LVEF 20-25%, global hypokinesis, grade 2 diastolic  dysfunction.   Combined acute on chronic systolic and diastolic CHF exacerbation Prior TTE shows LVEF 25 to 30% and grade 2 diastolic dysfunction, severe LV hypokinesis. BNP 1090 elevated Continue Lasix 40 mg IV twice daily  Continue spironolactone 25 mg p.o. daily and losartan 25 mg p.o. daily Avoided beta-blocker and calcium blocker due to cocaine use Continue fluid restriction, monitor intake and output and daily body weight Repeat 2D echocardiogram as above shows decreased EF 20 to 25% Follow cardiology for further recommendation  Hypertension and HLD Continued statin, losartan, spironolactone and IV Lasix Monitor BP and titrate medications accordingly   IDDM T2, HbA1c 10.1, uncontrolled diabetes, most likely due to noncompliance Held home regimen for now Started Semglee 20 units nightly and NovoLog sliding scale Monitor CBG, continue diabetic diet   Acute metabolic encephalopathy Most likely due to cocaine use disorder Currently patient is back to his baseline.  Continue supportive care.   Acute hypoxic respiratory failure due to CHF exacerbation Continue supplemental O2 inhalation and gradually wean off. Monitor pulse ox  Drug use disorder UDS positive for cocaine, drug abuse appearance counseling done.  Soft tissue prominence, incidental finding on CT chest Soft tissue prominence Incidental on the patient's CT scan Will need interval outpatient follow-up given his malignancy history Recommend to follow with PCP as an outpatient  Hemorrhoids, suppository order placed Peyronie's disease, patient was advised to follow-up with urology as an outpatient  Body mass index is 26.1 kg/m.  Interventions:  Diet: Diabetic diet DVT Prophylaxis: Therapeutic Anticoagulation with heparin IV infusion    Advance goals of care discussion: Full code  Family  Communication: family was not present at bedside, at the time of interview.  The pt provided permission to discuss  medical plan with the family. Opportunity was given to ask question and all questions were answered satisfactorily.   Disposition:  Pt is from Home, admitted with NSTEMI s/p IN Hep gtt, CHF, scheduled for cardiac cath today, which precludes a safe discharge. Discharge to Home, when cleared by cardiology.  Most likely tomorrow a.m.  Subjective: No significant event overnight, patient was hungry and was asking about the procedure.  He was told about the cardiac cath but he was not sure what they were talking about.  Patient remained chest pain-free, no shortness of breath, no nasal complaints. Patient seems noncompliant, stated that he has no medication at home and he needs refill of all the medications.   Physical Exam: General: NAD, lying comfortably Appear in no distress, affect appropriate Eyes: PERRLA ENT: Oral Mucosa Clear, moist  Neck: no JVD,  Cardiovascular: S1 and S2 Present, no Murmur,  Respiratory: good respiratory effort, Bilateral Air entry equal and Decreased, no Crackles, no wheezes Abdomen: Bowel Sound present, Soft and no tenderness,  Skin: no rashes Extremities: no Pedal edema, no calf tenderness Neurologic: without any new focal findings Gait not checked due to patient safety concerns  Vitals:   09/13/23 0838 09/13/23 1229 09/13/23 1408 09/13/23 1432  BP: (!) 111/94 (!) 132/105 (!) 118/93   Pulse: 91 99 88   Resp: 18 16 17    Temp: 98.3 F (36.8 C) 98 F (36.7 C) 98.1 F (36.7 C)   TempSrc: Oral  Oral   SpO2: 100% 99% 99% 100%  Weight:      Height:        Intake/Output Summary (Last 24 hours) at 09/13/2023 1520 Last data filed at 09/13/2023 0700 Gross per 24 hour  Intake 989.26 ml  Output 1550 ml  Net -560.74 ml   Filed Weights   09/11/23 1853  Weight: 82.5 kg    Data Reviewed: I have personally reviewed and interpreted daily labs, tele strips, imagings as discussed above. I reviewed all nursing notes, pharmacy notes, vitals, pertinent old  records I have discussed plan of care as described above with RN and patient/family.  CBC: Recent Labs  Lab 09/11/23 1520 09/11/23 2042 09/12/23 0758 09/13/23 0422  WBC 8.2 8.1 8.7 5.9  NEUTROABS 5.9  --   --   --   HGB 14.8 15.3 15.7 14.4  HCT 46.0 47.9 48.3 42.5  MCV 91.8 92.6 91.7 88.5  PLT 267 284 280 234   Basic Metabolic Panel: Recent Labs  Lab 09/11/23 1520 09/11/23 2042 09/12/23 0758 09/13/23 0422  NA 133* 134* 140 137  K 4.5 4.9 4.0 3.9  CL 102 100 105 101  CO2 22 23 26 27   GLUCOSE 269* 292* 111* 286*  BUN 16 14 13  25*  CREATININE 1.07 1.01 1.15 1.25*  CALCIUM 8.5* 8.9 8.9 8.5*  MG  --   --  2.1 2.0  PHOS  --   --  4.2 4.2    Studies: CARDIAC CATHETERIZATION  Result Date: 09/13/2023   Ost RCA to Prox RCA lesion is 30% stenosed.   Dist RCA lesion is 65% stenosed.   Mid Cx to Dist Cx lesion is 85% stenosed.   Ost LAD to Prox LAD lesion is 30% stenosed.   Prox Cx to Mid Cx lesion is 55% stenosed.   Recommend dual antiplatelet therapy with Aspirin 81mg  daily and Clopidogrel 75mg  daily. Critical lesion  of distal LCX, PCI with DES being deployed. Stent in proximal to mid LAd no significant disease. Distal RCA moderate to severe lesion is diffusedly diseased, treat medically.   ECHOCARDIOGRAM COMPLETE  Result Date: 09/13/2023    ECHOCARDIOGRAM REPORT   Patient Name:   Joshua [name] Date of Exam: 09/13/2023 Medical Rec #:  [id number]             Height:       70.0 in Accession #:    [id number]            Weight:       181.9 lb Date of Birth:  Apr 21, 1971            BSA:          2.005 m Patient Age:    51 years              BP:           111/94 mmHg Patient Gender: M                     HR:           91 bpm. Exam Location:  [facility] Procedure: 2D Echo, Cardiac Doppler and Color Doppler Indications:     Cardiomyopathy--Ischemic I25.5  History:         Patient has prior history of Echocardiogram examinations, most                  recent 10/30/2021. CHF; Risk  Factors:Diabetes and Hypertension.  Sonographer:     Cristela Blue Referring Phys:  [id number] [facility] A KHAN Diagnosing Phys: Adrian Blackwater IMPRESSIONS  1. Left ventricular ejection fraction, by estimation, is 20 to 25%. The left ventricle has severely decreased function. The left ventricle demonstrates global hypokinesis. The left ventricular internal cavity size was mildly dilated. There is mild left ventricular hypertrophy. Left ventricular diastolic parameters are consistent with Grade II diastolic dysfunction (pseudonormalization).  2. Right ventricular systolic function is severely reduced. The right ventricular size is moderately enlarged. Severely increased right ventricular wall thickness.  3. Left atrial size was severely dilated.  4. Right atrial size was severely dilated.  5. The mitral valve is grossly normal. Moderate mitral valve regurgitation.  6. The aortic valve is grossly normal. Aortic valve regurgitation is not visualized. Aortic valve sclerosis is present, with no evidence of aortic valve stenosis. Conclusion(s)/Recommendation(s): Findings consistent with ischemic cardiomyopathy. FINDINGS  Left Ventricle: Left ventricular ejection fraction, by estimation, is 20 to 25%. The left ventricle has severely decreased function. The left ventricle demonstrates global hypokinesis. The left ventricular internal cavity size was mildly dilated. There is mild left ventricular hypertrophy. Left ventricular diastolic parameters are consistent with Grade II diastolic dysfunction (pseudonormalization). Right Ventricle: The right ventricular size is moderately enlarged. Severely increased right ventricular wall thickness. Right ventricular systolic function is severely reduced. Left Atrium: Left atrial size was severely dilated. Right Atrium: Right atrial size was severely dilated. Pericardium: There is no evidence of pericardial effusion. Mitral Valve: The mitral valve is grossly normal. Moderate mitral valve  regurgitation. Tricuspid Valve: The tricuspid valve is grossly normal. Tricuspid valve regurgitation is mild. Aortic Valve: The aortic valve is grossly normal. Aortic valve regurgitation is not visualized. Aortic valve sclerosis is present, with no evidence of aortic valve stenosis. Aortic valve mean gradient measures 1.0 mmHg. Aortic valve peak gradient measures 2.7 mmHg. Aortic valve area, by VTI measures 2.62 cm. Pulmonic Valve: The pulmonic valve was grossly  normal. Pulmonic valve regurgitation is trivial. Aorta: The aortic root, ascending aorta, aortic arch and descending aorta are all structurally normal, with no evidence of dilitation or obstruction. IAS/Shunts: No atrial level shunt detected by color flow Doppler.  LEFT VENTRICLE PLAX 2D LVIDd:         5.90 cm      Diastology LVIDs:         5.40 cm      LV e' medial:    5.77 cm/s LV PW:         1.40 cm      LV E/e' medial:  22.2 LV IVS:        1.50 cm      LV e' lateral:   6.64 cm/s LVOT diam:     2.00 cm      LV E/e' lateral: 19.3 LV SV:         25 LV SV Index:   13 LVOT Area:     3.14 cm  LV Volumes (MOD) LV vol d, MOD A2C: 224.0 ml LV vol d, MOD A4C: 174.0 ml LV vol s, MOD A2C: 171.0 ml LV vol s, MOD A4C: 138.0 ml LV SV MOD A2C:     53.0 ml LV SV MOD A4C:     174.0 ml LV SV MOD BP:      51.5 ml RIGHT VENTRICLE RV Basal diam:  3.10 cm RV Mid diam:    2.60 cm RV S prime:     10.20 cm/s TAPSE (M-mode): 2.0 cm LEFT ATRIUM             Index        RIGHT ATRIUM           Index LA diam:        4.00 cm 2.00 cm/m   RA Area:     13.00 cm LA Vol (A2C):   49.2 ml 24.54 ml/m  RA Volume:   29.10 ml  14.52 ml/m LA Vol (A4C):   45.5 ml 22.70 ml/m LA Biplane Vol: 47.0 ml 23.44 ml/m  AORTIC VALVE AV Area (Vmax):    2.10 cm AV Area (Vmean):   2.21 cm AV Area (VTI):     2.62 cm AV Vmax:           81.40 cm/s AV Vmean:          52.200 cm/s AV VTI:            0.096 m AV Peak Grad:      2.7 mmHg AV Mean Grad:      1.0 mmHg LVOT Vmax:         54.30 cm/s LVOT Vmean:         36.800 cm/s LVOT VTI:          0.080 m LVOT/AV VTI ratio: 0.84  AORTA Ao Root diam: 3.30 cm MITRAL VALVE                TRICUSPID VALVE MV Area (PHT): 5.42 cm     TR Peak grad:   11.3 mmHg MV Decel Time: 140 msec     TR Vmax:        168.00 cm/s MV E velocity: 128.00 cm/s MV A velocity: 55.50 cm/s   SHUNTS MV E/A ratio:  2.31         Systemic VTI:  0.08 m  Systemic Diam: 2.00 cm Adrian Blackwater Electronically signed by Adrian Blackwater Signature Date/Time: 09/13/2023/3:05:44 PM    Final     Scheduled Meds:  [START ON 09/14/2023] aspirin  81 mg Oral Pre-Cath   [MAR Hold] aspirin EC  81 mg Oral Daily   [MAR Hold] atorvastatin  80 mg Oral Daily   [MAR Hold] dapagliflozin propanediol  10 mg Oral Daily   [MAR Hold] diazepam  5 mg Intravenous Once   [MAR Hold] furosemide  20 mg Intravenous Daily   [MAR Hold] hydrocortisone  25 mg Rectal BID   [MAR Hold] insulin aspart  0-15 Units Subcutaneous Q4H   [MAR Hold] insulin glargine-yfgn  20 Units Subcutaneous QHS   [MAR Hold] losartan  25 mg Oral Daily   [MAR Hold] metoprolol succinate  25 mg Oral Daily   sodium chloride flush  3 mL Intravenous Q12H   [MAR Hold] spironolactone  25 mg Oral Daily   Continuous Infusions:  sodium chloride     [START ON 09/14/2023] sodium chloride 10 mL/hr at 09/13/23 1405   heparin Stopped (09/13/23 1339)   PRN Meds: sodium chloride, [MAR Hold] acetaminophen, clopidogrel, fentaNYL, fentaNYL (SUBLIMAZE) injection, Heparin (Porcine) in NaCl, heparin sodium (porcine), [MAR Hold] hydrocortisone **FOLLOWED BY** [MAR Hold] hydrocortisone, iohexol, lidocaine (PF), midazolam, [MAR Hold]  morphine injection, nitroGLYCERIN, sodium chloride flush  Time spent: 40 minutes  Author: Gillis Santa. MD [facility] 09/13/2023 3:20 PM  To reach On-call, see care teams to locate the attending and reach out to them via [email or web address]. If 7PM-7AM, please contact night-coverage If you still have difficulty reaching  the attending provider, please page the [facility] (Director on Call) for [facility] on amion for assistance.

## 2023-09-13 NOTE — Discharge Instructions (Addendum)
Low Sodium Nutrition Therapy  Eating less sodium can help you if you have high blood pressure, heart failure, or kidney or liver disease.   Your body needs a little sodium, but too much sodium can cause your body to hold onto extra water. This extra water will raise your blood pressure and can cause damage to your heart, kidneys, or liver as they are forced to work harder.   Sometimes you can see how the extra fluid affects you because your hands, legs, or belly swell. You may also hold water around your heart and lungs, which makes it hard to breathe.   Even if you take medication for blood pressure or a water pill (diuretic) to remove fluid, it is still important to have less salt in your diet.   Check with your primary care provider before drinking alcohol since it may affect the amount of fluid in your body and how your heart, kidneys, or liver work. Sodium in Food A low-sodium meal plan limits the sodium that you get from food and beverages to 1,500-2,000 milligrams (mg) per day. Salt is the main source of sodium. Read the nutrition label on the package to find out how much sodium is in one serving of a food.  Select foods with 140 milligrams (mg) of sodium or less per serving.  You may be able to eat one or two servings of foods with a little more than 140 milligrams (mg) of sodium if you are closely watching how much sodium you eat in a day.  Check the serving size on the label. The amount of sodium listed on the label shows the amount in one serving of the food. So, if you eat more than one serving, you will get more sodium than the amount listed.  Tips Cutting Back on Sodium Eat more fresh foods.  Fresh fruits and vegetables are low in sodium, as well as frozen vegetables and fruits that have no added juices or sauces.  Fresh meats are lower in sodium than processed meats, such as bacon, sausage, and hotdogs.  Not all processed foods are unhealthy, but some processed foods may have too  much sodium.  Eat less salt at the table and when cooking. One of the ingredients in salt is sodium.  One teaspoon of table salt has 2,300 milligrams of sodium.  Leave the salt out of recipes for pasta, casseroles, and soups. Be a Engineer, building services.  Food packages that say "Salt-free", sodium-free", "very low sodium," and "low sodium" have less than 140 milligrams of sodium per serving.  Beware of products identified as "Unsalted," "No Salt Added," "Reduced Sodium," or "Lower Sodium." These items may still be high in sodium. You should always check the nutrition label. Add flavors to your food without adding sodium.  Try lemon juice, lime juice, or vinegar.  Dry or fresh herbs add flavor.  Buy a sodium-free seasoning blend or make your own at home. You can purchase salt-free or sodium-free condiments like barbeque sauce in stores and online. Ask your registered dietitian nutritionist for recommendations and where to find them.   Eating in Restaurants Choose foods carefully when you eat outside your home. Restaurant foods can be very high in sodium. Many restaurants provide nutrition facts on their menus or their websites. If you cannot find that information, ask your server. Let your server know that you want your food to be cooked without salt and that you would like your salad dressing and sauces to be served on the  side.    Foods Recommended Food Group Foods Recommended  Grains Bread, bagels, rolls without salted tops Homemade bread made with reduced-sodium baking powder Cold cereals, especially shredded wheat and puffed rice Oats, grits, or cream of wheat Pastas, quinoa, and rice Popcorn, pretzels or crackers without salt Corn tortillas  Protein Foods Fresh meats and fish; [place] bacon (check the nutrition labels - make sure they are not packaged in a sodium solution) Canned or packed tuna (no more than 4 ounces at 1 serving) Beans and peas Soybeans) and tofu Eggs Nuts or nut butters  without salt  Dairy Milk or milk powder Plant milks, such as rice and soy Yogurt, including Greek yogurt Small amounts of natural cheese (blocks of cheese) or reduced-sodium cheese can be used in moderation. (Swiss, ricotta, and fresh mozzarella cheese are lower in sodium than the others) Cream Cheese Low sodium cottage cheese  Vegetables Fresh and frozen vegetables without added sauces or salt Homemade soups (without salt) Low-sodium, salt-free or sodium-free canned vegetables and soups  Fruit Fresh and canned fruits Dried fruits, such as raisins, cranberries, and prunes  Oils Tub or liquid margarine, regular or without salt Canola, corn, peanut, olive, safflower, or sunflower oils  Condiments Fresh or dried herbs such as basil, bay leaf, dill, mustard (dry), nutmeg, paprika, parsley, rosemary, sage, or thyme.  Low sodium ketchup Vinegar  Lemon or lime juice Pepper, red pepper flakes, and cayenne. Hot sauce contains sodium, but if you use just a drop or two, it will not add up to much.  Salt-free or sodium-free seasoning mixes and marinades Simple salad dressings: vinegar and oil   Foods Not Recommended Food Group Foods Not Recommended  Grains Breads or crackers topped with salt Cereals (hot/cold) with more than 300 mg sodium per serving Biscuits, cornbread, and other "quick" breads prepared with baking soda Pre-packaged bread crumbs Seasoned and packaged rice and pasta mixes Self-rising flours  Protein Foods Cured meats: Bacon, ham, sausage, pepperoni and hot dogs Canned meats (chili, vienna sausage, or sardines) Smoked fish and meats Frozen meals that have more than 600 mg of sodium per serving Egg substitute (with added sodium)  Dairy Buttermilk Processed cheese spreads Cottage cheese (1 cup may have over 500 mg of sodium; look for low-sodium.) American or feta cheese Shredded Cheese has more sodium than blocks of cheese String cheese  Vegetables Canned vegetables  (unless they are salt-free, sodium-free or low sodium) Frozen vegetables with seasoning and sauces Sauerkraut and pickled vegetables Canned or dried soups (unless they are salt-free, sodium-free, or low sodium) [place] fries and onion rings  Fruit Dried fruits preserved with additives that have sodium  Oils Salted butter or margarine, all types of olives  Condiments Salt, sea salt, kosher salt, onion salt, and garlic salt Seasoning mixes with salt Bouillon cubes Ketchup Barbeque sauce and Worcestershire sauce unless low sodium Soy sauce Salsa, pickles, olives, relish Salad dressings: ranch, blue cheese, [place], and [place].   Low Sodium Sample 1-Day Menu  Breakfast 1 cup cooked oatmeal  1 slice whole wheat bread toast  1 tablespoon peanut butter without salt  1 banana  1 cup 1% milk  Lunch Tacos made with: 2 corn tortillas   cup black beans, low sodium   cup roasted or grilled chicken (without skin)   avocado  Squeeze of lime juice  1 cup salad greens  1 tablespoon low-sodium salad dressing   cup strawberries  1 orange  Afternoon Snack 1/3 cup grapes  6 ounces yogurt  Evening Meal 3 ounces herb-baked fish  1 baked potato  2 teaspoons olive oil   cup cooked carrots  2 thick slices tomatoes on:  2 lettuce leaves  1 teaspoon olive oil  1 teaspoon balsamic vinegar  1 cup 1% milk  Evening Snack 1 apple   cup almonds without salt   Low-Sodium Vegetarian (Lacto-Ovo) Sample 1-Day Menu  Breakfast 1 cup cooked oatmeal  1 slice whole wheat toast  1 tablespoon peanut butter without salt  1 banana  1 cup 1% milk  Lunch Tacos made with: 2 corn tortillas   cup black beans, low sodium   cup roasted or grilled chicken (without skin)   avocado  Squeeze of lime juice  1 cup salad greens  1 tablespoon low-sodium salad dressing   cup strawberries  1 orange  Evening Meal Stir fry made with:  cup tofu  1 cup brown rice   cup broccoli   cup green beans   cup  peppers   tablespoon peanut oil  1 orange  1 cup 1% milk  Evening Snack 4 strips celery  2 tablespoons hummus  1 hard-boiled egg   Low-Sodium Vegan Sample 1-Day Menu  Breakfast 1 cup cooked oatmeal  1 tablespoon peanut butter without salt  1 cup blueberries  1 cup soymilk fortified with calcium, vitamin B12, and vitamin D  Lunch 1 small whole wheat pita   cup cooked lentils  2 tablespoons hummus  4 carrot sticks  1 medium apple  1 cup soymilk fortified with calcium, vitamin B12, and vitamin D  Evening Meal Stir fry made with:  cup tofu  1 cup brown rice   cup broccoli   cup green beans   cup peppers   tablespoon peanut oil  1 cup cantaloupe  Evening Snack 1 cup soy yogurt   cup mixed nuts  Copyright 2020  Academy of Nutrition and Dietetics. All rights reserved  Sodium Free Flavoring Tips  When cooking, the following items may be used for flavoring instead of salt or seasonings that contain sodium. Remember: A little bit of spice goes a long way! Be careful not to overseason. Spice Blend Recipe (makes about ? cup) 5 teaspoons onion powder  2 teaspoons garlic powder  2 teaspoons paprika  2 teaspoon dry mustard  1 teaspoon crushed thyme leaves   teaspoon white pepper   teaspoon celery seed Food Item Flavorings  Beef Basil, bay leaf, caraway, curry, dill, dry mustard, garlic, grape jelly, green pepper, mace, marjoram, mushrooms (fresh), nutmeg, onion or onion powder, parsley, pepper, rosemary, sage  Chicken Basil, cloves, cranberries, mace, mushrooms (fresh), nutmeg, oregano, paprika, parsley, pineapple, saffron, sage, savory, tarragon, thyme, tomato, turmeric  Egg Chervil, curry, dill, dry mustard, garlic or garlic powder, green pepper, jelly, mushrooms (fresh), nutmeg, onion powder, paprika, parsley, rosemary, tarragon, tomato  Fish Basil, bay leaf, chervil, curry, dill, dry mustard, green pepper, lemon juice, marjoram, mushrooms (fresh), paprika, pepper,  tarragon, tomato, turmeric  Lamb Cloves, curry, dill, garlic or garlic powder, mace, mint, mint jelly, onion, oregano, parsley, pineapple, rosemary, tarragon, thyme  Pork Applesauce, basil, caraway, chives, cloves, garlic or garlic powder, onion or onion powder, rosemary, thyme  Veal Apricots, basil, bay leaf, currant jelly, curry, ginger, marjoram, mushrooms (fresh), oregano, paprika  Vegetables Basil, dill, garlic or garlic powder, ginger, lemon juice, mace, marjoram, nutmeg, onion or onion powder, tarragon, tomato, sugar or sugar substitute, salt-free salad dressing, vinegar  Desserts Allspice, anise, cinnamon, cloves, ginger, mace, nutmeg, vanilla extract, other  extracts   Copyright 2020  Academy of Nutrition and Dietetics. All rights reserved  Fluid Restricted Nutrition Therapy  You have been prescribed this diet because your condition affects how much fluid you can eat or drink. If your heart, liver, or kidneys aren't working properly, you may not be able to effectively eliminate fluids from the body and this may cause swelling (edema) in the legs, arms, and/or stomach. Drink no more than _________ liters or ________ ounces or ________cups of fluid per day.  You don't need to stop eating or drinking the same fluids you normally would, but you may need to eat or drink less than usual.  Your registered dietitian nutritionist will help you determine the correct amount of fluid to consume during the day Breakfast Include fluids taken with medications  Lunch Include fluids taken with medications  Dinner Include fluids taken with medications  Bedtime Snack Include fluids taken with medications     Tips What Are Fluids?  A fluid is anything that is liquid or anything that would melt if left at room temperature. You will need to count these foods and liquids--including any liquid used to take medication--as part of your daily fluid intake. Some examples are: Alcohol (drink only with your  doctor's permission)  Coffee, tea, and other hot beverages  Gelatin (Jell-O)  Gravy  Ice cream, sherbet, sorbet  Ice cubes, ice chips  Milk, liquid creamer  Nutritional supplements  Popsicles  Vegetable and fruit juices; fluid in canned fruit  Watermelon  Yogurt  Soft drinks, lemonade, limeade  Soups  Syrup How Do I Measure My Fluid Intake? Record your fluid intake daily.  Tip: Every day, each time you eat or drink fluids, pour water in the same amount into an empty container that can hold the same amount of fluids you are allowed daily. This may help you keep track of how much fluid you are taking in throughout the day.  To accurately keep track of how much liquid you take in, measure the size of the cups, glasses, and bowls you use. If you eat soup, measure how much of it is liquid and how much is solid (such as noodles, vegetables, meat). Conversions for Measuring Fluid Intake  Milliliters (mL) Liters (L) Ounces (oz) Cups (c)  1000 1 32 4  1200 1.2 40 5  1500 1.5 50 6 1/4  1800 1.8 60 7 1/2  2000 2 67 8 1/3  Tips to Reduce Your Thirst Chew gum or suck on hard candy.  Rinse or gargle with mouthwash. Do not swallow.  Ice chips or popsicles my help quench thirst, but this too needs to be calculated into the total restriction. Melt ice chips or cubes first to figure out how much fluid they produce (for example, experiment with melting  cup ice chips or 2 ice cubes).  Add a lemon wedge to your water.  Limit how much salt you take in. A high salt intake might make you thirstier.  Don't eat or drink all your allowed liquids at once. Space your liquids out through the day.  Use small glasses and cups and sip slowly. If allowed, take your medications with fluids you eat or drink during a meal.   Fluid-Restricted Nutrition Therapy Sample 1-Day Menu  Breakfast 1 slice wheat toast  1 tablespoon peanut butter  1/2 cup yogurt (120 milliliters)  1/2 cup blueberries  1 cup milk (240  milliliters)   Lunch 3 ounces sliced [place]  2 slices whole wheat  bread  1/2 cup lettuce for sandwich  2 slices tomato for sandwich  1 ounce reduced-fat, reduced-sodium cheese  1/2 cup fresh carrot sticks  1 banana  1 cup unsweetened tea (240 milliliters)   Evening Meal 8 ounces soup (240 milliliters)  3 ounces salmon  1/2 cup quinoa  1 cup green beans  1 cup mixed greens salad  1 tablespoon olive oil  1 cup coffee (240 milliliters)  Evening Snack 1/2 cup sliced peaches  1/2 cup frozen yogurt (120 milliliters)  1 cup water (240 milliliters)  Copyright 2020  Academy of Nutrition and Dietetics. All rights reserved   Food Resources  Agency Name: [facility] Agency Address: [address] [place], [place] [address] Phone: [phone] Website: [email or web address] Service(s) Offered: Housing services, self-sufficiency, congregate meal program, weatherization program, Event organiser program, emergency food assistance,  housing counseling, home ownership program, wheels - to work program.  [employer] free for 60 and older at various locations from [employer], Monday-Friday:  [employer], [address] [place], [phone] -[facility], [address], [place] [phone]  -[facility], [address] [place] [phone]  -[address] [place], [phone]  Agency Name: [facility] Address: [phone] [address], [place], [place] [address] Phone: [phone] Website: [email or web address] Service(s) Offered: Home delivered hot, frozen, and emergency  meals. Grocery assistance program which matches  volunteers one-on-one with seniors unable to grocery shop  for themselves. Must be 60 years and older; less than 20  hours of in-home aide service, limited or no driving ability;  live alone or with someone with a disability; live in  [place].  Agency Name: [facility]) Address: [address], [place], [place] [address] Phone: [phone] Service(s) Offered: Food is served to shut-ins, homeless, elderly, and low income people in the community every Saturday (11:30 am-12:30 pm) and Sunday (12:30 pm-1:30pm). Volunteers also offer help and encouragement in seeking employment,  and spiritual guidance.  Agency Name: [facility] Address: [address] [place], [place] [address] Phone: [phone] Service(s) Offered: Child support services; child welfare services; food stamps; Medicaid; work first family assistance; and aid with fuel,  rent, food and medicine.  Agency Name: Dietitian Address: [address], [place], [place] Phone: [phone] Website: [email or web address] Services Offered: Monday 10:00am-12:00, 8:00pm-9:00pm, and Friday 10:00am-12:00.  Agency Name: [employer] [facility] [place] Address: [address] [place], [place] [address] Phone: [phone] Website: [email or web address] Service(s) Offered: Serves weekday meals, open from 11:30 am- 1:00 pm., and 6:30-7:30pm, Monday-Wednesday-Friday distributes food 3:30-6pm, Monday-Wednesday-Friday.  Agency Name: [facility] Address: [address] [place], [place] Phone: [phone] Website: [email or web address] Services Offered: Distributes food the 4th Saturday of the month, starting at 8:00 am  Agency Name: [facility] Address: [phone] [address], [place], [place] [address] Phone: [phone] Website: [email or web address] Service(s) Offered: Bread of life, weekly food pantry. Open Wednesdays from 10:00am-noon.  Agency Name: [facility] [employer] Address: [address] [place], [place], [place] Phone: [phone] Services Offered: Distributes food 9am-1pm, Monday-Thursday. Call for details.  Agency Name:  [facility] Address: [address] [place], [place] [address] Phone: [phone] Website: [email or web address] Service(s) Offered: Games developer. Call for assistance.  Agency Name: [facility] Address: [address] [place], [place] [address] Phone: [phone] Service Offered: Emergency Food Pantry. Call for appointment.  Agency Name: [facility] Address:  [address], [place], [place] [address] Phone: [phone] Website: [email or web address] Services Offered: Games developer. Call for details  Agency Name: [facility] Address: [address] [place], [place] Phone: [phone] Website: [email or web address] Service(s) Offered: Emergency Food Pantry. Call for details.  Agency Name: Holiday representative Address: [address] [place], [place] [address] Phone: [phone] or [phone] Website: [email or web address] Service(s) Offered: Distribute food 9am-11:30 am, Tuesday-Friday, and 1-3:30pm, Monday-Friday. Food pantry Monday-Friday 1pm-3pm, fresh items, Mon.-Wed.-Fri.  Agency Name: [facility] Address: [address] [place], [place] [address] Phone: [phone] Website: [email or web address] Services Offered: Distribute food Tues and Sats from 9:00am-noon. Closed 1st Saturday of each month. Call for details   Rent/Utility/Housing  Agency Name: [facility] Agency Address: [address] [place], [place] [address] Phone: [phone] Email: [email or web address] Website: [email or web address] Service(s) Offered: Housing services, self-sufficiency, congregate meal program, weatherization program, Field seismologist program, emergency food assistance,  housing counseling, home ownership program, wheels -towork program.  Agency Name: [facility] Address: [address] [place], [place] [address] Phone:  [phone] (8a-4p) [phone] (8p- 10p) Email: [email or web address] Website: [email or web address] Service(s) Offered: A program for homeless and/or needy men that includes one-on-one counseling, life skills training and job rehabilitation.  Agency Name: [employer] [facility] [place] Address: [address], [place], [place] [address] Phone: [phone] Website: [email or web address] Service(s) Offered: Assistance to needy in emergency with utility bills, heating fuel, and prescriptions. Shelter for homeless 7pm-7am. April 08, 2017 15  Agency Name: [facility] [place] [facility] Dis[facility] Address: [address], [place], [place] [address] Phone: [phone] Contact Person: Cathleen Corti Email: [email or web address] Website: [email or web address] Service(s) Offered: Helps individuals with developmental disabilities move from housing that is more restrictive to homes where they  can achieve greater independence and have more  opportunities.  Agency Name: [facility] Address: [address], [place], [place] [address] Phone: [phone] Email: [email or web address] Website: [email or web address] Service(s) Offered: Provides affordable housing for low-income families, elderly, and disabled individuals. Offer a wide range of  programs and services, from financial planning to afterschool and summer programs.  Agency Name: Department of Social Services Address: [address] [place], [place] [address] Phone: [phone] Service(s) Offered: Child support services; child welfare services; food stamps; Medicaid; work first family assistance; and aid with fuel,  rent, food and medicine.  Agency Name: [facility] [place] [employer]. Address: [facility] [address], [place], [place]  [address] Phone: [phone] Website: [email or web address] Service(s) Offered: 24 hour Crisis Line:  [phone]; 24 hour Emergency Shelter; Transitional Housing; Support Groups; Scientist, physiological; [employer]; Hispanic Outreach: [phone];  Visitation Center: [phone].  Agency Name: [facility] [place]. Address: [address], [place], [place] [address] Phone: [phone] Service(s) Offered: CAP Services; Home and [employer]; Individual or Group Supports; Respite Care Non-Institutional Nursing;  Residential Supports; Respite Care and Personal Care Services; Transportation; Family and Friends Night; Recreational Activities; Three Nutritious Meals/Snacks; Consultation with Registered Dietician; Twenty-four hour Registered Nurse Access; Daily and [employer]; Camp [place]aves; [place] for the [employer] (During Summer Months) Bingo Night (Every  Wednesday Night); Special Populations Dance Night  (Every Tuesday Night); Professional Hair Care Services.  Agency Name: [facility] Address: [address], [place], [place] [address] Phone: [phone] Contact Person: Jabier Mutton Website: [email or web address].Physicist, medical) Offered: Residential treatment facility for women; food and  clothing, educational & employment development and  transportation to work; Counsellor of financial skills;  parenting and family  reunification; emotional and spiritual  support; transitional housing for program graduates.  Agency Name: [facility] Address: [address], [place], [place] [address] Phone: [phone] Email: [email or web address] Website: [email or web address] Service(s) Offered: Public housing units for elderly, disabled, and low income people; housing choice vouchers for income eligible  applicants; shelter plus care vouchers; and Psychologist, clinical.  Agency Name: [facility] [employer] Address: [address], [place], [place] [address] Phone: [phone] Email:  [email or web address] Website: www.[email or web address].org Service(s) Offered: Build houses for families in need of decent housing. Each adult in the family must invest 200 hours of labor on  someone else's house, work with volunteers to build their own house, attend classes on budgeting, home maintenance, yard care, and attend homeowner association meetings.  Agency Name: [facility] Address: [address], [place], [place] [address] Phone: [phone] Website: [email or web address] Service(s) Offered: Intermediate care facilities for intellectually delayed, Supervised Living in group homes for adults with developmental disabilities, Supervised Living for people who have dual diagnoses (MRMI), Independent Living, Supported Living, respite and a variety of CAP services, pre-vocational services, day supports, and [employer].  Agency Name: [place] [employer] Phone: [phone] Website: www.[place] Service(s) Offered: Zero-interest, deferred loans to homeowners struggling to pay their mortgage. Call for more information.   Transportation Resources  Agency Name: [facility] Agency Address: [address] [place], [place] [address] Phone: [phone] Email: [email or web address] Website: [email or web address] Service(s) Offered: Housing services, self-sufficiency, congregate meal program, weatherization program, Field seismologist program, emergency food assistance,  housing counseling, home ownership program, wheels-towork program.  Agency Name: [facility] [employer] [phone]) Address: [address] [place], [place] [address] Phone: [phone] Website: [email or web address] Service(s) Offered: Transportation for [employer], subscription and demand response; Dial-a-Ride for citizens 64 years of age or older.  Agency Name: Department of Social Services Address: [address] [place], [place] [address] Phone: [phone] Service(s) Offered: Child support services; child welfare services; food stamps; Medicaid; work first family assistance; and aid with fuel,  rent, food and medicine, transportation assistance.  Agency Name: Disabled [employer] (DAV) Transportation  Network Phone: [phone] Service(s) Offered: Transports veterans to the [facility] medical center. Call  forty-eight hours in advance and leave the name, telephone  number, date, and time of appointment. Veteran will be  contacted by the driver the day before the appointment to  arrange a pick up point   Transportation Resources  Agency Name: [facility] Agency Address: [address] [place], [place] [address] Phone: [phone] Email: [email or web address] Website: [email or web address] Service(s) Offered: Housing services, self-sufficiency, congregate meal program, weatherization program, Field seismologist program, emergency food assistance,  housing counseling, home ownership program, wheels-towork program.  Agency Name: [facility] [employer] [phone]) Address: [address] [place], [place] [address] Phone: [phone] Website: [email or web address] Service(s) Offered: Transportation for [employer], subscription and demand response; Dial-a-Ride for citizens 12 years of age or older.  Agency Name: Department of Social Services Address: [address] [place], [place] [address] Phone: [phone] Service(s) Offered: Child support services; child welfare services; food stamps; Medicaid; work first family assistance; and aid with fuel,  rent, food and medicine, transportation assistance.  Agency Name: Disabled [employer] (DAV) Transportation  Network Phone: [phone] Service(s) Offered: Transports veterans to the [facility] medical center. Call  forty-eight hours in advance and  leave the name, telephone  number, date, and time of appointment. Thomasene Ripple will be  contacted by the driver the day before  the appointment to  arrange a pick up point    [facility] [employer] ACTA currently provides door to door services. ACTA connects with PART daily for services to [facility]. ACTA also performs contract services to [employer] operates 27 vehicles, all but 3 mini-vans are equipped with lifts for special needs as well as the general public. ACTA drivers are each CDL certified and trained in First Aid and CPR. ACTA was established in 2002 by [employer]. An independent Industrial/product designer. ACTA operates via Cytogeneticist with required Research scientist (physical sciences) from [employer]. ACTA provides over 80,000 passenger trips each year, including [facility] and [employer] sites.    Call at least by 11 AM one business day prior to needing transportation  [employer].                      [place], [place] [address]     Office Hours: Monday-Friday  8 AM - 5 PM   Some PCP options in [place] area- not a comprehensive list  [facility]- [phone] [facility]- [phone] [facility]- [phone] [facility]- [phone] [facility]- [phone] Lutricia Horsfall- [phone]  or [facility] Physician Referral Line [phone]

## 2023-09-14 ENCOUNTER — Other Ambulatory Visit: Payer: Self-pay

## 2023-09-14 ENCOUNTER — Encounter: Payer: Self-pay | Admitting: Cardiovascular Disease

## 2023-09-14 DIAGNOSIS — Z955 Presence of coronary angioplasty implant and graft: Secondary | ICD-10-CM

## 2023-09-14 DIAGNOSIS — I214 Non-ST elevation (NSTEMI) myocardial infarction: Secondary | ICD-10-CM

## 2023-09-14 LAB — BASIC METABOLIC PANEL
Anion gap: 6 (ref 5–15)
BUN: 20 mg/dL (ref 6–20)
CO2: 25 mmol/L (ref 22–32)
Calcium: 8.4 mg/dL — ABNORMAL LOW (ref 8.9–10.3)
Chloride: 102 mmol/L (ref 98–111)
Creatinine, Ser: 1.13 mg/dL (ref 0.61–1.24)
GFR, Estimated: >60 mL/min (ref >60)
Glucose, Bld: 297 mg/dL — ABNORMAL HIGH (ref 70–99)
Potassium: 4 mmol/L (ref 3.5–5.1)
Sodium: 133 mmol/L — ABNORMAL LOW (ref 135–145)

## 2023-09-14 LAB — GLUCOSE, CAPILLARY
Glucose-Capillary: 130 mg/dL — ABNORMAL HIGH (ref 70–99)
Glucose-Capillary: 193 mg/dL — ABNORMAL HIGH (ref 70–99)
Glucose-Capillary: 210 mg/dL — ABNORMAL HIGH (ref 70–99)
Glucose-Capillary: 231 mg/dL — ABNORMAL HIGH (ref 70–99)
Glucose-Capillary: 241 mg/dL — ABNORMAL HIGH (ref 70–99)

## 2023-09-14 LAB — CBC
HCT: 40.5 % (ref 39.0–52.0)
Hemoglobin: 13.9 g/dL (ref 13.0–17.0)
MCH: 29.9 pg (ref 26.0–34.0)
MCHC: 34.3 g/dL (ref 30.0–36.0)
MCV: 87.1 fL (ref 80.0–100.0)
Platelets: 228 10*3/uL (ref 150–400)
RBC: 4.65 MIL/uL (ref 4.22–5.81)
RDW: 13.9 % (ref 11.5–15.5)
WBC: 5.9 10*3/uL (ref 4.0–10.5)
nRBC: 0 % (ref 0.0–0.2)

## 2023-09-14 LAB — MAGNESIUM: Magnesium: 2 mg/dL (ref 1.7–2.4)

## 2023-09-14 LAB — PHOSPHORUS: Phosphorus: 5.1 mg/dL — ABNORMAL HIGH (ref 2.5–4.6)

## 2023-09-14 LAB — TROPONIN I (HIGH SENSITIVITY)
Troponin I (High Sensitivity): 4851 ng/L (ref <18)
Troponin I (High Sensitivity): 4945 ng/L (ref <18)

## 2023-09-14 MED ORDER — LOSARTAN POTASSIUM 25 MG PO TABS
25.0000 mg | ORAL_TABLET | Freq: Every day | ORAL | 11 refills | Status: DC
  Filled 2023-09-14: qty 30, 30d supply, fill #0

## 2023-09-14 MED ORDER — DAPAGLIFLOZIN PROPANEDIOL 10 MG PO TABS
10.0000 mg | ORAL_TABLET | Freq: Every day | ORAL | 11 refills | Status: DC
  Filled 2023-09-14: qty 30, 30d supply, fill #0

## 2023-09-14 MED ORDER — HYDROCORTISONE ACETATE 25 MG RE SUPP
25.0000 mg | Freq: Two times a day (BID) | RECTAL | 0 refills | Status: AC | PRN
  Filled 2023-09-14 (×2): qty 15, 8d supply, fill #0

## 2023-09-14 MED ORDER — FUROSEMIDE 20 MG PO TABS
20.0000 mg | ORAL_TABLET | Freq: Every day | ORAL | 1 refills | Status: DC | PRN
  Filled 2023-09-14: qty 30, 30d supply, fill #0

## 2023-09-14 MED ORDER — ASPIRIN 81 MG PO TBEC
81.0000 mg | DELAYED_RELEASE_TABLET | Freq: Every day | ORAL | 12 refills | Status: DC
  Filled 2023-09-14: qty 30, 30d supply, fill #0

## 2023-09-14 MED ORDER — SPIRONOLACTONE 25 MG PO TABS
25.0000 mg | ORAL_TABLET | Freq: Every day | ORAL | 11 refills | Status: DC
  Filled 2023-09-14: qty 30, 30d supply, fill #0

## 2023-09-14 MED ORDER — "PEN NEEDLES 5/16"" 31G X 8 MM MISC"
1.0000 | Freq: Every day | 1 refills | Status: DC
  Filled 2023-09-14: qty 100, 90d supply, fill #0

## 2023-09-14 MED ORDER — CLOPIDOGREL BISULFATE 75 MG PO TABS
75.0000 mg | ORAL_TABLET | Freq: Every day | ORAL | 11 refills | Status: DC
  Filled 2023-09-14: qty 30, 30d supply, fill #0

## 2023-09-14 MED ORDER — INSULIN GLARGINE 100 UNIT/ML SOLOSTAR PEN
40.0000 [IU] | PEN_INJECTOR | Freq: Every day | SUBCUTANEOUS | 11 refills | Status: DC
  Filled 2023-09-14: qty 15, 37d supply, fill #0

## 2023-09-14 MED ORDER — METFORMIN HCL 500 MG PO TABS
500.0000 mg | ORAL_TABLET | Freq: Every day | ORAL | 11 refills | Status: DC
  Filled 2023-09-14: qty 30, 30d supply, fill #0

## 2023-09-14 MED ORDER — ATORVASTATIN CALCIUM 80 MG PO TABS
80.0000 mg | ORAL_TABLET | Freq: Every day | ORAL | 11 refills | Status: DC
  Filled 2023-09-14: qty 30, 30d supply, fill #0

## 2023-09-14 MED ORDER — POLYETHYLENE GLYCOL 3350 17 G PO PACK
17.0000 g | PACK | Freq: Every day | ORAL | 0 refills | Status: DC
  Filled 2023-09-14: qty 14, 14d supply, fill #0

## 2023-09-14 MED ORDER — CARVEDILOL 6.25 MG PO TABS
6.2500 mg | ORAL_TABLET | Freq: Two times a day (BID) | ORAL | 11 refills | Status: DC
  Filled 2023-09-14: qty 60, 30d supply, fill #0

## 2023-09-14 NOTE — Progress Notes (Signed)
6045 Patient complained of Constipation stating he only passed a couple small pellets of stool earlier this morning.Patient complaining of Abdominal distention that resolved once patient walked around the room passed gas and belched a couple times. Dr.Duncan notified Colace and Miralax ordered PRN.  2352 Dr Para March notified of Blood sugar of 491. 10 units of IV insulin ordered and given     0113 Patients blood sugar 210 patient asking for more food or he was going to leave AMA. Dr.Duncan instructed nurse to give sugar-free snacks. Sugar free pudding located and given to patient.    0241 Patients Vitals stable alert and oriented with no complaint. Alarm for ST elevation EKG gotten and Dr Para March notified. Troponin Labs ordered   0334 Change in QRS complex appears to have low voltage. Troponin of 4,851 and EKG Changes reported to Dr Para March. Patient asymptomatic. Dr Para March has not conern with EKG and First Troponin is trending down.

## 2023-09-14 NOTE — TOC Transition Note (Signed)
Transition of Care [facility]) - CM/SW Discharge Note   Patient Details  Name: Joshua [name] MRN: [id number] Date of Birth: July 01, 1971  [facility]) CM/SW Contact:  Chapman Fitch, RN Phone Number: 09/14/2023, 1:21 PM   Clinical Narrative:     Per ICU staff patient has confirmed he has transport for discharge     Barriers to Discharge: Continued Medical Work up   Patient Goals and CMS Choice      Discharge Placement                         Discharge Plan and Services Additional resources added to the After Visit Summary for                                       Social Determinants of Health (SDOH) Interventions SDOH Screenings   Food Insecurity: Food Insecurity Present (09/12/2023)  Housing: Medium Risk (09/12/2023)  Transportation Needs: Unmet Transportation Needs (09/12/2023)  Utilities: Not At Risk (09/12/2023)  Financial Resource Strain: High Risk (04/10/2022)   [facility] from [facility], [facility]  Stress: Stress Concern Present (12/19/2021)   Received from [facility], [facility]  Tobacco Use: High Risk (09/12/2023)  Health Literacy: Medium Risk (12/19/2021)   Received from [facility], [facility]     Readmission Risk Interventions    09/13/2023    1:16 PM 10/31/2021    1:01 PM  Readmission Risk Prevention Plan  Transportation Screening Complete Complete  PCP or Specialist Appt within 3-5 Days Complete   HRI or Home Care Consult Complete   Social Work Consult for Recovery Care Planning/Counseling Complete   Palliative Care Screening Not Applicable   Medication Review Oceanographer) Complete Complete  PCP or Specialist appointment within 3-5 days of discharge  Complete  SW Recovery Care/Counseling Consult  Complete  Palliative Care Screening  Not Applicable  Skilled Nursing Facility  Not Applicable

## 2023-09-14 NOTE — Progress Notes (Addendum)
2021 Messaged Dr Kirke Corin for clarification about stopped Heparin Drip and parameters for nitroglycerin drip  2025 Dr Kirke Corin instructed nurse to titrate nitroglycerin drip for chest pain and to keep SBP less then 130. Confirmed that heparin drip should be stopped.

## 2023-09-14 NOTE — Progress Notes (Signed)
SUBJECTIVE: Patient sitting up eating breakfast denies any chest pain or shortness of breath and wants to go home.   Vitals:   09/14/23 0600 09/14/23 0700 09/14/23 0800 09/14/23 0900  BP: (!) 124/102 (!) 123/99 (!) 107/95 118/82  Pulse: 96 94 (!) 115 98  Resp: (!) 21 (!) 21 (!) 23 (!) 26  Temp:   (!) 97.4 F (36.3 C)   TempSrc:   Oral   SpO2: 100% 97% 100% (!) 89%  Weight:      Height:        Intake/Output Summary (Last 24 hours) at 09/14/2023 0952 Last data filed at 09/14/2023 0500 Gross per 24 hour  Intake 194.78 ml  Output 1425 ml  Net -1230.22 ml    LABS: Basic Metabolic Panel: Recent Labs    09/13/23 0422 09/14/23 0306  NA 137 133*  K 3.9 4.0  CL 101 102  CO2 27 25  GLUCOSE 286* 297*  BUN 25* 20  CREATININE 1.25* 1.13  CALCIUM 8.5* 8.4*  MG 2.0 2.0  PHOS 4.2 5.1*   Liver Function Tests: Recent Labs    09/11/23 1520 09/11/23 2042  AST 33 61*  ALT 22 23  ALKPHOS 84 86  BILITOT 1.3* 1.2  PROT 7.6 7.7  ALBUMIN 4.0 4.1   Recent Labs    09/11/23 1520  LIPASE 20   CBC: Recent Labs    09/11/23 1520 09/11/23 2042 09/13/23 0422 09/14/23 0306  WBC 8.2   < > 5.9 5.9  NEUTROABS 5.9  --   --   --   HGB 14.8   < > 14.4 13.9  HCT 46.0   < > 42.5 40.5  MCV 91.8   < > 88.5 87.1  PLT 267   < > 234 228   < > = values in this interval not displayed.   Cardiac Enzymes: Recent Labs    09/11/23 2042  CKTOTAL 601*   BNP: Invalid input(s): "POCBNP" D-Dimer: No results for input(s): "DDIMER" in the last 72 hours. Hemoglobin A1C: Recent Labs    09/11/23 2042  HGBA1C 10.1*   Fasting Lipid Panel: No results for input(s): "CHOL", "HDL", "LDLCALC", "TRIG", "CHOLHDL", "LDLDIRECT" in the last 72 hours. Thyroid Function Tests: Recent Labs    09/12/23 0758  TSH 0.828   Anemia Panel: No results for input(s): "VITAMINB12", "FOLATE", "FERRITIN", "TIBC", "IRON", "RETICCTPCT" in the last 72 hours.   PHYSICAL EXAM General: Well developed, well nourished,  in no acute distress HEENT:  Normocephalic and atramatic Neck:  No JVD.  Lungs: Clear bilaterally to auscultation and percussion. Heart: HRRR . Normal S1 and S2 without gallops or murmurs.  Abdomen: Bowel sounds are positive, abdomen soft and non-tender  Msk:  Back normal, normal gait. Normal strength and tone for age. Extremities: No clubbing, cyanosis or edema.   Neuro: Alert and oriented X 3. Psych:  Good affect, responds appropriately  TELEMETRY: Normal sinus rhythm  ASSESSMENT AND PLAN: Status post non-STEMI with high-grade lesion about 80% in the distal left circumflex and status post PCI and drug-eluting stenting.  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  Severe ischemic cardiomyopathy will start the patient on GDMT and continue in the office.  Patient is doing very well and can be discharged with follow-up 9:00 on Monday in my office at [facility].  Patient can can go to telemetry in order to walk around before he goes home or could be discharged from ICU.  ICD-10-CM   1. NSTEMI, initial episode of care (HCC)  I21.4     2. Cocaine abuse (HCC)  F14.10       Principal Problem:   NSTEMI (non-ST elevated myocardial infarction) (HCC) Active Problems:   Acute hypoxic respiratory failure (HCC)   Acute metabolic encephalopathy   Essential hypertension   DM (diabetes mellitus), type 2 (HCC)   Acute on chronic combined systolic and diastolic CHF (congestive heart failure) (HCC)   Soft tissue complaint   Hypocalcemia   Hyponatremia    Adrian Blackwater, MD, [facility] 09/14/2023 9:52 AM

## 2023-09-14 NOTE — Progress Notes (Signed)
Patient requesting to go ahead and discharge as he did not want his ride to wait. Discharge instructions reviewed with patient. All questions answered. Patient able to verbalize instructions, including how to pick up his medications.

## 2023-09-14 NOTE — Plan of Care (Signed)
  Problem: Education: Goal: Ability to describe self-care measures that may prevent or decrease complications (Diabetes Survival Skills Education) will improve Outcome: Progressing Goal: Individualized Educational Video(s) Outcome: Progressing   Problem: Fluid Volume: Goal: Ability to maintain a balanced intake and output will improve Outcome: Progressing   Problem: Health Behavior/Discharge Planning: Goal: Ability to identify and utilize available resources and services will improve Outcome: Progressing Goal: Ability to manage health-related needs will improve Outcome: Progressing   Problem: Metabolic: Goal: Ability to maintain appropriate glucose levels will improve Outcome: Progressing   Problem: Nutritional: Goal: Maintenance of adequate nutrition will improve Outcome: Progressing Goal: Progress toward achieving an optimal weight will improve Outcome: Progressing   Problem: Skin Integrity: Goal: Risk for impaired skin integrity will decrease Outcome: Progressing   Problem: Tissue Perfusion: Goal: Adequacy of tissue perfusion will improve Outcome: Progressing   Problem: Education: Goal: Knowledge of General Education information will improve Description: Including pain rating scale, medication(s)/side effects and non-pharmacologic comfort measures Outcome: Progressing   Problem: Health Behavior/Discharge Planning: Goal: Ability to manage health-related needs will improve Outcome: Progressing   Problem: Clinical Measurements: Goal: Ability to maintain clinical measurements within normal limits will improve Outcome: Progressing Goal: Will remain free from infection Outcome: Progressing Goal: Diagnostic test results will improve Outcome: Progressing Goal: Respiratory complications will improve Outcome: Progressing Goal: Cardiovascular complication will be avoided Outcome: Progressing   Problem: Nutrition: Goal: Adequate nutrition will be maintained Outcome:  Progressing   Problem: Coping: Goal: Level of anxiety will decrease Outcome: Progressing   Problem: Elimination: Goal: Will not experience complications related to bowel motility Outcome: Progressing Goal: Will not experience complications related to urinary retention Outcome: Progressing   Problem: Pain Managment: Goal: General experience of comfort will improve Outcome: Progressing   Problem: Safety: Goal: Ability to remain free from injury will improve Outcome: Progressing   Problem: Skin Integrity: Goal: Risk for impaired skin integrity will decrease Outcome: Progressing   Problem: Education: Goal: Understanding of CV disease, CV risk reduction, and recovery process will improve Outcome: Progressing Goal: Individualized Educational Video(s) Outcome: Progressing   Problem: Activity: Goal: Ability to return to baseline activity level will improve Outcome: Progressing   Problem: Cardiovascular: Goal: Ability to achieve and maintain adequate cardiovascular perfusion will improve Outcome: Progressing Goal: Vascular access site(s) Level 0-1 will be maintained Outcome: Progressing   Problem: Health Behavior/Discharge Planning: Goal: Ability to safely manage health-related needs after discharge will improve Outcome: Progressing

## 2023-09-14 NOTE — Discharge Summary (Signed)
[facility] Discharge Summary   Patient: Joshua [name] GEX:[id number]  PCP: System, Provider Not In  Date of admission: 09/11/2023   Date of discharge:  09/14/2023     Discharge Diagnoses:  Principal Problem:   NSTEMI (non-ST elevated myocardial infarction) [facility]) Active Problems:   Acute hypoxic respiratory failure (HCC)   Acute metabolic encephalopathy   Essential hypertension   DM (diabetes mellitus), type 2 (HCC)   Acute on chronic combined systolic and diastolic CHF (congestive heart failure) (HCC)   Soft tissue complaint   Hypocalcemia   Hyponatremia   Admitted From: Home Disposition:  Home   Recommendations for Outpatient Follow-up:  Follow-up with PCP in 1 week Follow-up with cardiology on Monday Follow up LABS/TEST: Repeat CT chest in few weeks   Diet recommendation: Cardiac diet  Activity: The patient is advised to gradually reintroduce usual activities, as tolerated  Discharge Condition: stable  Code Status: Full code   History of present illness: As per the H and P dictated on admission Hospital Course:  52 year old male with a PMH of CAD s/p STEMI in 2019 with PCI, chronic mixed systolic and diastolic heart failure, colon cancer s/p colectomy, IDDM T2, PAD, who presented at [facility] ED via EMS  due to c/o severe chest pain.  In the ED patient was somnolent, opened eyes to vigorous sternal rub but did not provide any useful history.  Upon signout from ER physician and chart review the patient's troponin is notably elevated at 276, past medical records reviewed and summarized including the echocardiogram from 2022 EF was 25% that time grade II diastolic function, UDS was notable positive for cocaine, past medical records reviewed and summarized of the patient's 2019 catheterization:  Multivessel coronary disease100% mid LAD TIMI 0 was the culprit lesion IRASerial disease in the mid circumflex multiple lesions in the mid RCA and distalDistal 100% RCA with  collaterals left to rightSuccessful PCI and stent of mid 100% LAD which was the IRADES stent deployed 3.0 x 23 mm [facility] consulted for admission and further management as below.   Assessment and Plan:   Non-STEMI. History of CAD s/p stents.  Patient presented with chest pain, most likely demand ischemia due to cocaine abuse. Patient is noncompliant with medications. Patient does not take aspirin at home. Aspirin 300 mg rectal suppository was given in the ED. S/p Heparin IV infusion. Troponin peaked 24K, gradually trended down. S/p Telemetry. Started aspirin 81 mg p.o. daily. Cardiology consulted, recommended cardiac cath, s/p overlap drug-eluting stent placement to the distal and mid left circumflex. Successful angioplasty but Very difficult procedure overall due to tortuosity and calcifications as per cardio.  9/30 TTE LVEF 20-25%, global hypokinesis, grade 2 diastolic dysfunction. Cardiology recommended DAPT aspirin and Plavix for 12 months aggressive treatment of risk factors.  Patient had chest pain and elevated blood pressure throughout the procedure.  Patient was on nitroglycerin drip and transferred to stepdown ICU unit for overnight monitoring.  Toprol was switched to carvedilol.  In the morning patient was chest pain free and ambulated well without symptoms before discharge.  Patient was cleared by cardiology to discharge home and follow-up with an outpatient on Monday.  # Combined acute on chronic systolic and diastolic CHF exacerbation Prior TTE shows LVEF 25 to 30% and grade 2 diastolic dysfunction, severe LV hypokinesis. BNP 1090 elevated. s/p Lasix 40 mg IV twice daily.  Continue spironolactone 25 mg p.o. daily and losartan 25 mg p.o. daily Avoided beta-blocker and calcium blocker  due to cocaine use. S/p fluid restriction, monitored intake and output and daily body weight. Repeat 2D echocardiogram as above shows decreased EF 20 to 25% Started Coreg 6.25 mg p.o. twice daily  Farxiga 10 mg p.o. daily, continued Lasix 20 mg p.o. daily as needed, losartan 25 mg p.o. daily and spironolactone 25 mg p.o. daily. # Hypertension and HLD: Continued statin, losartan, spironolactone. S/p IV Lasix given during hospital stay, resumed Lasix as needed home dose.  Patient was advised to monitor BP at home and follow with PCP and cardiology to titrate medication accordingly. # IDDM T2, HbA1c 10.1, uncontrolled diabetes, most likely due to noncompliance. S/p Semglee 20 units nightly, NovoLog sliding scale.  Resumed home regimen on discharge, patient was advised to monitor CBG, continue diabetic diet and follow with PCP for further management as an outpatient. # Acute metabolic encephalopathy: Most likely due to cocaine use disorder Currently patient is back to his baseline.  # Acute hypoxic respiratory failure due to CHF exacerbation S/p supplemental O2 inhalation and gradually weaned off.  Currently saturating well on room air. # Drug use disorder: UDS positive for cocaine, drug abuse appearance counseling done. # Soft tissue prominence, incidental finding on CT chest Soft tissue prominence. Incidental on the patient's CT scan Will need interval outpatient follow-up given his malignancy history Recommend to follow with PCP as an outpatient # Hemorrhoids, suppository order placed # Peyronie's disease, patient was advised to follow-up with urology as an outpatient  Body mass index is 26.1 kg/m.  Nutrition Interventions:  Patient was ambulatory without any assistance. On the day of the discharge the patient's vitals were stable, and no other acute medical condition were reported by patient. the patient was felt safe to be discharge at Home.  Consultants: Cardiologist  Procedures: LHC cardiac cath s/p overlapping stents mid and distal left circumflex.  Discharge Exam: General: Appear in no distress, no Rash; Oral Mucosa Clear, moist. Cardiovascular: S1 and S2 Present, no  Murmur, Respiratory: normal respiratory effort, Bilateral Air entry present and no Crackles, no wheezes Abdomen: Bowel Sound present, Soft and no tenderness, no hernia Extremities: no Pedal edema, no calf tenderness Neurology: alert and oriented to time, place, and person affect appropriate.  Filed Weights   09/11/23 1853  Weight: 82.5 kg   Vitals:   09/14/23 1000 09/14/23 1200  BP:    Pulse:    Resp: (!) 26   Temp:  98 F (36.7 C)  SpO2:      DISCHARGE MEDICATION: Allergies as of 09/14/2023       Reactions   Penicillins Other (See Comments)   Patient unsure of allergy        Medication List     TAKE these medications    aspirin EC 81 MG tablet Take 1 tablet (81 mg total) by mouth daily. Swallow whole. Start taking on: September 15, 2023   atorvastatin 80 MG tablet Commonly known as: LIPITOR Take 1 tablet (80 mg total) by mouth daily.   carvedilol 6.25 MG tablet Commonly known as: COREG Take 1 tablet (6.25 mg total) by mouth 2 (two) times daily with a meal.   clopidogrel 75 MG tablet Commonly known as: PLAVIX Take 1 tablet (75 mg total) by mouth daily with breakfast. Start taking on: September 15, 2023   dapagliflozin propanediol 10 MG Tabs tablet Commonly known as: FARXIGA Take 1 tablet (10 mg total) by mouth daily. Start taking on: September 15, 2023   furosemide 20 MG tablet Commonly known as: Lasix  Take 1 tablet (20 mg total) by mouth daily as needed (for weight gain >3lbs in 1 days and >5lbs in 2 days).   hydrocortisone 25 MG suppository Commonly known as: ANUSOL-HC Place 1 suppository (25 mg total) rectally 2 (two) times daily as needed for hemorrhoids or anal itching.   insulin glargine 100 UNIT/ML Solostar Pen Commonly known as: LANTUS Inject 40 Units into the skin daily.   losartan 25 MG tablet Commonly known as: Cozaar Take 1 tablet (25 mg total) by mouth daily.   metFORMIN 500 MG tablet Commonly known as: Glucophage Take 1 tablet (500 mg  total) by mouth daily with breakfast.   polyethylene glycol 17 g packet Commonly known as: MIRALAX / GLYCOLAX Take 17 g by mouth daily. Start taking on: September 15, 2023   spironolactone 25 MG tablet Commonly known as: ALDACTONE Take 1 tablet (25 mg total) by mouth daily.       Allergies  Allergen Reactions   Penicillins Other (See Comments)    Patient unsure of allergy   Discharge Instructions     AMB Referral to Cardiac Rehabilitation - Phase II   Complete by: As directed    Diagnosis:  NSTEMI Coronary Stents     After initial evaluation and assessments completed: Virtual Based Care may be provided alone or in conjunction with Phase 2 Cardiac Rehab based on patient barriers.: Yes   Intensive Cardiac Rehabilitation (ICR) [facility] location only OR Traditional Cardiac Rehabilitation (TCR) *If criteria for ICR are not met will enroll in TCR [facility] only): Yes   Call MD for:  difficulty breathing, headache or visual disturbances   Complete by: As directed    Call MD for:  extreme fatigue   Complete by: As directed    Call MD for:  persistant dizziness or light-headedness   Complete by: As directed    Call MD for:  persistant nausea and vomiting   Complete by: As directed    Call MD for:  severe uncontrolled pain   Complete by: As directed    Diet - low sodium heart healthy   Complete by: As directed    Discharge instructions   Complete by: As directed    Follow-up with PCP in 1 week Follow-up with cardiology on Monday   Increase activity slowly   Complete by: As directed        The results of significant diagnostics from this hospitalization (including imaging, microbiology, ancillary and laboratory) are listed below for reference.    Significant Diagnostic Studies: CARDIAC CATHETERIZATION  Result Date: 09/13/2023   Ost RCA to Prox RCA lesion is 30% stenosed.   Dist RCA lesion is 65% stenosed.   Mid Cx to Dist Cx lesion is 85% stenosed.   Ost LAD to Prox LAD lesion is 30%  stenosed.   Prox Cx to Mid Cx lesion is 55% stenosed.   Recommend dual antiplatelet therapy with Aspirin 81mg  daily and Clopidogrel 75mg  daily. Critical lesion of distal LCX, PCI with DES being deployed. Stent in proximal to mid LAd no significant disease. Distal RCA moderate to severe lesion is diffusedly diseased, treat medically.   CARDIAC CATHETERIZATION  Result Date: 09/13/2023   Mid LAD lesion is 30% stenosed.   Mid Cx to Dist Cx lesion is 90% stenosed.   Mid Cx lesion is 70% stenosed.   2nd Mrg lesion is 30% stenosed.   Prox Cx lesion is 30% stenosed.   A drug-eluting stent was successfully placed using a STENT ONYX FRONTIER 3.0X22.  A drug-eluting stent was successfully placed using a STENT ONYX FRONTIER 3.0X18.   Post intervention, there is a 0% residual stenosis.   Post intervention, there is a 0% residual stenosis. Successful angioplasty and to overlap drug-eluting stent placement to the distal and mid left circumflex.  Very difficult procedure overall due to tortuosity and calcifications. Recommendations: Dual antiplatelet therapy for at least 12 months. Aggressive treatment of risk factors. The patient had chest pain and elevated blood pressure throughout the procedure.  He was started on nitroglycerin drip and will be observed in the stepdown ICU. I switched Toprol to carvedilol.   ECHOCARDIOGRAM COMPLETE  Result Date: 09/13/2023    ECHOCARDIOGRAM REPORT   Patient Name:   Joshua [name] Date of Exam: 09/13/2023 Medical Rec #:  [id number]             Height:       70.0 in Accession #:    [id number]            Weight:       181.9 lb Date of Birth:  07/11/1971            BSA:          2.005 m Patient Age:    51 years              BP:           111/94 mmHg Patient Gender: M                     HR:           91 bpm. Exam Location:  [facility] Procedure: 2D Echo, Cardiac Doppler and Color Doppler Indications:     Cardiomyopathy--Ischemic I25.5  History:         Patient has prior history of  Echocardiogram examinations, most                  recent 10/30/2021. CHF; Risk Factors:Diabetes and Hypertension.  Sonographer:     Cristela Blue Referring Phys:  [id number] [facility] A KHAN Diagnosing Phys: Adrian Blackwater IMPRESSIONS  1. Left ventricular ejection fraction, by estimation, is 20 to 25%. The left ventricle has severely decreased function. The left ventricle demonstrates global hypokinesis. The left ventricular internal cavity size was mildly dilated. There is mild left ventricular hypertrophy. Left ventricular diastolic parameters are consistent with Grade II diastolic dysfunction (pseudonormalization).  2. Right ventricular systolic function is severely reduced. The right ventricular size is moderately enlarged. Severely increased right ventricular wall thickness.  3. Left atrial size was severely dilated.  4. Right atrial size was severely dilated.  5. The mitral valve is grossly normal. Moderate mitral valve regurgitation.  6. The aortic valve is grossly normal. Aortic valve regurgitation is not visualized. Aortic valve sclerosis is present, with no evidence of aortic valve stenosis. Conclusion(s)/Recommendation(s): Findings consistent with ischemic cardiomyopathy. FINDINGS  Left Ventricle: Left ventricular ejection fraction, by estimation, is 20 to 25%. The left ventricle has severely decreased function. The left ventricle demonstrates global hypokinesis. The left ventricular internal cavity size was mildly dilated. There is mild left ventricular hypertrophy. Left ventricular diastolic parameters are consistent with Grade II diastolic dysfunction (pseudonormalization). Right Ventricle: The right ventricular size is moderately enlarged. Severely increased right ventricular wall thickness. Right ventricular systolic function is severely reduced. Left Atrium: Left atrial size was severely dilated. Right Atrium: Right atrial size was severely dilated. Pericardium: There is no evidence of pericardial effusion.  Mitral Valve: The mitral valve  is grossly normal. Moderate mitral valve regurgitation. Tricuspid Valve: The tricuspid valve is grossly normal. Tricuspid valve regurgitation is mild. Aortic Valve: The aortic valve is grossly normal. Aortic valve regurgitation is not visualized. Aortic valve sclerosis is present, with no evidence of aortic valve stenosis. Aortic valve mean gradient measures 1.0 mmHg. Aortic valve peak gradient measures 2.7 mmHg. Aortic valve area, by VTI measures 2.62 cm. Pulmonic Valve: The pulmonic valve was grossly normal. Pulmonic valve regurgitation is trivial. Aorta: The aortic root, ascending aorta, aortic arch and descending aorta are all structurally normal, with no evidence of dilitation or obstruction. IAS/Shunts: No atrial level shunt detected by color flow Doppler.  LEFT VENTRICLE PLAX 2D LVIDd:         5.90 cm      Diastology LVIDs:         5.40 cm      LV e' medial:    5.77 cm/s LV PW:         1.40 cm      LV E/e' medial:  22.2 LV IVS:        1.50 cm      LV e' lateral:   6.64 cm/s LVOT diam:     2.00 cm      LV E/e' lateral: 19.3 LV SV:         25 LV SV Index:   13 LVOT Area:     3.14 cm  LV Volumes (MOD) LV vol d, MOD A2C: 224.0 ml LV vol d, MOD A4C: 174.0 ml LV vol s, MOD A2C: 171.0 ml LV vol s, MOD A4C: 138.0 ml LV SV MOD A2C:     53.0 ml LV SV MOD A4C:     174.0 ml LV SV MOD BP:      51.5 ml RIGHT VENTRICLE RV Basal diam:  3.10 cm RV Mid diam:    2.60 cm RV S prime:     10.20 cm/s TAPSE (M-mode): 2.0 cm LEFT ATRIUM             Index        RIGHT ATRIUM           Index LA diam:        4.00 cm 2.00 cm/m   RA Area:     13.00 cm LA Vol (A2C):   49.2 ml 24.54 ml/m  RA Volume:   29.10 ml  14.52 ml/m LA Vol (A4C):   45.5 ml 22.70 ml/m LA Biplane Vol: 47.0 ml 23.44 ml/m  AORTIC VALVE AV Area (Vmax):    2.10 cm AV Area (Vmean):   2.21 cm AV Area (VTI):     2.62 cm AV Vmax:           81.40 cm/s AV Vmean:          52.200 cm/s AV VTI:            0.096 m AV Peak Grad:      2.7 mmHg  AV Mean Grad:      1.0 mmHg LVOT Vmax:         54.30 cm/s LVOT Vmean:        36.800 cm/s LVOT VTI:          0.080 m LVOT/AV VTI ratio: 0.84  AORTA Ao Root diam: 3.30 cm MITRAL VALVE                TRICUSPID VALVE MV Area (PHT): 5.42 cm     TR Peak grad:  11.3 mmHg MV Decel Time: 140 msec     TR Vmax:        168.00 cm/s MV E velocity: 128.00 cm/s MV A velocity: 55.50 cm/s   SHUNTS MV E/A ratio:  2.31         Systemic VTI:  0.08 m                             Systemic Diam: 2.00 cm Adrian Blackwater Electronically signed by Adrian Blackwater Signature Date/Time: 09/13/2023/3:05:44 PM    Final    CT Angio Chest/Abd/Pel for Dissection W and/or Wo Contrast  Result Date: 09/11/2023 CLINICAL DATA:  Chest pain with breathing difficulties. Acute aortic syndrome suspected. EXAM: CT ANGIOGRAPHY CHEST, ABDOMEN AND PELVIS TECHNIQUE: Non-contrast CT of the chest was initially obtained. Multidetector CT imaging through the chest, abdomen and pelvis was performed using the standard protocol during bolus administration of intravenous contrast. Multiplanar reconstructed images and MIPs were obtained and reviewed to evaluate the vascular anatomy. RADIATION DOSE REDUCTION: This exam was performed according to the departmental dose-optimization program which includes automated exposure control, adjustment of the mA and/or kV according to patient size and/or use of iterative reconstruction technique. CONTRAST:  75mL OMNIPAQUE IOHEXOL 350 MG/ML SOLN COMPARISON:  Chest CTA and abdominopelvic CT 11/16/2021. FINDINGS: CTA CHEST FINDINGS Cardiovascular: Pre contrast images demonstrate extensive coronary artery atherosclerosis. There are no displaced intimal calcifications within the thoracic aorta. Following contrast, there is no evidence of aortic dissection, aneurysm or intimal ulceration. There is no evidence of acute pulmonary embolism. The heart is mildly enlarged. Trace pericardial fluid. Mediastinum/Nodes: New ill-defined soft tissue  thickening throughout the mediastinal fat with extension into the hilar regions. No discretely enlarged mediastinal lymph nodes are identified. The thyroid gland, trachea and esophagus demonstrate no significant findings. Lungs/Pleura: Trace bilateral pleural effusions. No pneumothorax. There is new diffuse central airway thickening with septal and fissural thickening and patchy ground-glass opacities throughout both lungs. No confluent airspace disease or suspicious pulmonary nodularity. Musculoskeletal/Chest wall: No chest wall mass or suspicious osseous findings. Review of the MIP images confirms the above findings. CTA ABDOMEN AND PELVIS FINDINGS VASCULAR Aorta: Mild aortic atherosclerosis. Normal caliber aorta without aneurysm, dissection or significant stenosis. Celiac: Patent without evidence of aneurysm, dissection or significant stenosis. SMA: Patent without evidence of aneurysm, dissection or significant stenosis. Renals: Both renal arteries are patent without evidence of aneurysm, dissection or significant stenosis. IMA: Patent without evidence of aneurysm, dissection, vasculitis or significant stenosis. Inflow: Bilateral iliac atherosclerosis. Patent without evidence of aneurysm, dissection, vasculitis or significant stenosis. Veins: No obvious venous abnormality within the limitations of this arterial phase study. Review of the MIP images confirms the above findings. NON-VASCULAR FINDINGS Hepatobiliary: The liver is normal in density without suspicious focal abnormality. There is mild reflux of contrast into the IVC and hepatic veins. No evidence of gallstones, gallbladder wall thickening or biliary dilatation. Pancreas: Unremarkable. No pancreatic ductal dilatation or surrounding inflammatory changes. Spleen: Normal in size without focal abnormality. Adrenals/Urinary Tract: Both adrenal glands appear normal. The kidneys appear normal without evidence of urinary tract calculus, suspicious lesion or  hydronephrosis. The urinary bladder is mildly distended without focal abnormality. Stomach/Bowel: No enteric contrast administered. The stomach appears unremarkable for its degree of distension. No evidence of bowel wall thickening, distention or surrounding inflammatory change. Evidence of interval right hemicolectomy with ileocolonic anastomosis. No evidence of recurrent bowel obstruction. Lymphatic: Nodal assessment limited by paucity of retroperitoneal fat.  No discretely enlarged abdominopelvic lymph nodes identified. Reproductive: The prostate gland and seminal vesicles appear unremarkable. Bilateral scrotal hydroceles noted. Other: No evidence of abdominal wall mass or hernia. No ascites. Postsurgical changes in the anterior abdominal wall. Musculoskeletal: No acute or significant osseous findings. Review of the MIP images confirms the above findings. IMPRESSION: 1. No evidence of acute aortic syndrome or acute pulmonary embolism. No acute vascular findings in the chest, abdomen or pelvis. 2. Coronary artery, aortic and iliac atherosclerosis. 3. New diffuse central airway thickening with septal and fissural thickening and patchy ground-glass opacities throughout both lungs, suspicious for pulmonary edema. Trace bilateral pleural effusions. 4. New ill-defined soft tissue thickening throughout the mediastinal fat with extension into the hilar regions, nonspecific. This could be secondary to edema or inflammation. No discretely enlarged lymph nodes identified. If the suspected pulmonary edema does not resolve with appropriate treatment, follow-up chest CT recommended to exclude atypical malignancy (especially if there is a history of malignancy as suggested by previous abdominal CT). 5. Interval right hemicolectomy with ileocolonic anastomosis. No evidence of recurrent bowel obstruction. Electronically Signed   By: Carey Bullocks M.D.   On: 09/11/2023 17:38   CT Head Wo Contrast  Result Date:  09/11/2023 CLINICAL DATA:  Mental status change. EXAM: CT HEAD WITHOUT CONTRAST TECHNIQUE: Contiguous axial images were obtained from the base of the skull through the vertex without intravenous contrast. RADIATION DOSE REDUCTION: This exam was performed according to the departmental dose-optimization program which includes automated exposure control, adjustment of the mA and/or kV according to patient size and/or use of iterative reconstruction technique. COMPARISON:  None Available. FINDINGS: Brain: No acute intracranial hemorrhage. No focal mass lesion. No CT evidence of acute infarction. No midline shift or mass effect. No hydrocephalus. Basilar cisterns are patent. Benign vascular space in the medial RIGHT parietal lobe. Vascular: No hyperdense vessel or unexpected calcification. Skull: Normal. Negative for fracture or focal lesion. Sinuses/Orbits: Chronic thickening of the sphenoid sinus walls. Frontal sinuses clear. Other: None. IMPRESSION: 1. No intracranial findings. 2. Chronic sphenoid sinus disease. Electronically Signed   By: Genevive Bi M.D.   On: 09/11/2023 16:09    Microbiology: No results found for this or any previous visit (from the past 240 hour(s)).   Labs: CBC: Recent Labs  Lab 09/11/23 1520 09/11/23 2042 09/12/23 0758 09/13/23 0422 09/14/23 0306  WBC 8.2 8.1 8.7 5.9 5.9  NEUTROABS 5.9  --   --   --   --   HGB 14.8 15.3 15.7 14.4 13.9  HCT 46.0 47.9 48.3 42.5 40.5  MCV 91.8 92.6 91.7 88.5 87.1  PLT 267 284 280 234 228   Basic Metabolic Panel: Recent Labs  Lab 09/11/23 1520 09/11/23 2042 09/12/23 0758 09/13/23 0422 09/14/23 0306  NA 133* 134* 140 137 133*  K 4.5 4.9 4.0 3.9 4.0  CL 102 100 105 101 102  CO2 22 23 26 27 25   GLUCOSE 269* 292* 111* 286* 297*  BUN 16 14 13  25* 20  CREATININE 1.07 1.01 1.15 1.25* 1.13  CALCIUM 8.5* 8.9 8.9 8.5* 8.4*  MG  --   --  2.1 2.0 2.0  PHOS  --   --  4.2 4.2 5.1*   Liver Function Tests: Recent Labs  Lab  09/11/23 1520 09/11/23 2042  AST 33 61*  ALT 22 23  ALKPHOS 84 86  BILITOT 1.3* 1.2  PROT 7.6 7.7  ALBUMIN 4.0 4.1   Recent Labs  Lab 09/11/23 1520  LIPASE 20  No results for input(s): "AMMONIA" in the last 168 hours. Cardiac Enzymes: Recent Labs  Lab 09/11/23 2042  CKTOTAL 601*   BNP (last 3 results) Recent Labs    12/18/22 0927 09/11/23 2042  BNP 101.4* 1,090.4*   CBG: Recent Labs  Lab 09/14/23 0057 09/14/23 0223 09/14/23 0400 09/14/23 0738 09/14/23 1144  GLUCAP 210* 193* 231* 130* 241*    Time spent: 35 minutes  Signed:  Gillis Santa  [facility] 09/14/2023 1:00 PM

## 2023-09-15 LAB — LIPOPROTEIN A (LPA): Lipoprotein (a): 184.7 nmol/L — ABNORMAL HIGH (ref <75.0)

## 2023-09-16 NOTE — Progress Notes (Deleted)
Advanced Heart Failure Clinic Note   Referring Physician: recent admit PCP: System, Provider Not In Cardiologist: None   HPI:  Joshua [name] is a 52 y/o male with a history of  Admitted 09/11/23 due to severe chest pain. In the ED patient was somnolent, opened eyes to vigorous sternal rub but did not provide any useful history.      Review of Systems: [y] = yes, [ ]  = no   General: Weight gain [ ] ; Weight loss [ ] ; Anorexia [ ] ; Fatigue [ ] ; Fever [ ] ; Chills [ ] ; Weakness [ ]   Cardiac: Chest pain/pressure [ ] ; Resting SOB [ ] ; Exertional SOB [ ] ; Orthopnea [ ] ; Pedal Edema [ ] ; Palpitations [ ] ; Syncope [ ] ; Presyncope [ ] ; Paroxysmal nocturnal dyspnea[ ]   Pulmonary: Cough [ ] ; Wheezing[ ] ; Hemoptysis[ ] ; Sputum [ ] ; Snoring [ ]   GI: Vomiting[ ] ; Dysphagia[ ] ; Melena[ ] ; Hematochezia [ ] ; Heartburn[ ] ; Abdominal pain [ ] ; Constipation [ ] ; Diarrhea [ ] ; BRBPR [ ]   GU: Hematuria[ ] ; Dysuria [ ] ; Nocturia[ ]   Vascular: Pain in legs with walking [ ] ; Pain in feet with lying flat [ ] ; Non-healing sores [ ] ; Stroke [ ] ; TIA [ ] ; Slurred speech [ ] ;  Neuro: Headaches[ ] ; Vertigo[ ] ; Seizures[ ] ; Paresthesias[ ] ;Blurred vision [ ] ; Diplopia [ ] ; Vision changes [ ]   Ortho/Skin: Arthritis [ ] ; Joint pain [ ] ; Muscle pain [ ] ; Joint swelling [ ] ; Back Pain [ ] ; Rash [ ]   Psych: Depression[ ] ; Anxiety[ ]   Heme: Bleeding problems [ ] ; Clotting disorders [ ] ; Anemia [ ]   Endocrine: Diabetes [ ] ; Thyroid dysfunction[ ]    Past Medical History:  Diagnosis Date   Abrasion of toe with infection, right, subsequent encounter 03/27/2021   CHF (congestive heart failure) (HCC)    Diabetes mellitus without complication (HCC)    Hypertension     Current Outpatient Medications  Medication Sig Dispense Refill   aspirin EC 81 MG tablet Take 1 tablet (81 mg total) by mouth daily. Swallow whole. 30 tablet 12   atorvastatin (LIPITOR) 80 MG tablet Take 1 tablet (80 mg total) by mouth daily. 30 tablet 11    carvedilol (COREG) 6.25 MG tablet Take 1 tablet (6.25 mg total) by mouth 2 (two) times daily with a meal. 60 tablet 11   clopidogrel (PLAVIX) 75 MG tablet Take 1 tablet (75 mg total) by mouth daily with breakfast. 30 tablet 11   dapagliflozin propanediol (FARXIGA) 10 MG TABS tablet Take 1 tablet (10 mg total) by mouth daily. 30 tablet 11   furosemide (LASIX) 20 MG tablet Take 1 tablet (20 mg total) by mouth daily as needed (for weight gain greater than 3 pounds in 1 day and  greater than 5 pounds in 2 days). 30 tablet 1   hydrocortisone (ANUSOL-HC) 25 MG suppository Insert 1 suppository (25 mg total) rectally 2 (two) times daily as needed for hemorrhoids or anal itching. 15 suppository 0   insulin glargine (LANTUS) 100 UNIT/ML Solostar Pen Inject 40 Units into the skin daily. 15 mL 11   Insulin Pen Needle (PEN NEEDLES 31GX5/16") 31G X 8 MM MISC 1 Needle by Does not apply route daily. 100 each 1   losartan (COZAAR) 25 MG tablet Take 1 tablet (25 mg total) by mouth daily. 30 tablet 11   metFORMIN (GLUCOPHAGE) 500 MG tablet Take 1 tablet (500 mg total) by mouth daily with breakfast. 30 tablet 11   polyethylene glycol (  MIRALAX / GLYCOLAX) 17 g packet Mix with water and take 17 grams (1 capful) by mouth daily. 14 each 0   spironolactone (ALDACTONE) 25 MG tablet Take 1 tablet (25 mg total) by mouth daily. 30 tablet 11   No current facility-administered medications for this visit.    Allergies  Allergen Reactions   Penicillins Other (See Comments)    Patient unsure of allergy      Social History   Socioeconomic History   Marital status: Single    Spouse name: Not on file   Number of children: Not on file   Years of education: Not on file   Highest education level: Not on file  Occupational History   Not on file  Tobacco Use   Smoking status: Every Day    Current packs/day: 0.50    Types: Cigarettes   Smokeless tobacco: Never  Vaping Use   Vaping status: Never Used  Substance and  Sexual Activity   Alcohol use: Yes    Alcohol/week: 1.0 standard drink of alcohol    Types: 1 Shots of liquor per week    Comment: once a year   Drug use: Yes    Comment: occasionally and in the past "whatever I could get"   Sexual activity: Yes    Partners: Female  Other Topics Concern   Not on file  Social History Narrative   Not on file   Social Determinants of Health   Financial Resource Strain: High Risk (04/10/2022)   Received from [facility], [facility]   Overall Financial Resource Strain (CARDIA)    Difficulty of Paying Living Expenses: Very hard  Food Insecurity: Food Insecurity Present (09/12/2023)   Hunger Vital Sign    Worried About Running Out of Food in the Last Year: Often true    Ran Out of Food in the Last Year: Often true  Transportation Needs: Unmet Transportation Needs (09/12/2023)   PRAPARE - Administrator, Civil Service (Medical): Yes    Lack of Transportation (Non-Medical): Yes  Physical Activity: Not on file  Stress: Stress Concern Present (12/19/2021)   Received from [facility], [facility]   [employer] of Occupational Health - Occupational Stress Questionnaire    Feeling of Stress : Rather much  Social Connections: Not on file  Intimate Partner Violence: Not At Risk (09/12/2023)   Humiliation, Afraid, Rape, and Kick questionnaire    Fear of Current or Ex-Partner: No    Emotionally Abused: No    Physically Abused: No    Sexually Abused: No      Family History  Problem Relation Age of Onset   Cancer Mother    CAD Brother        PHYSICAL EXAM: General:  Well appearing. No respiratory difficulty HEENT: normal Neck: supple. no JVD. Carotids 2+ bilat; no bruits. No lymphadenopathy or thyromegaly appreciated. Cor: PMI nondisplaced. Regular rate & rhythm. No rubs, gallops or murmurs. Lungs: clear Abdomen: soft, nontender, nondistended. No hepatosplenomegaly. No bruits or masses. Good bowel  sounds. Extremities: no cyanosis, clubbing, rash, edema Neuro: alert & oriented x 3, cranial nerves grossly intact. moves all 4 extremities w/o difficulty. Affect pleasant.  ECG:   ASSESSMENT & PLAN:     Delma Freeze, FNP 09/16/23

## 2023-09-20 ENCOUNTER — Encounter: Payer: 59 | Admitting: Family

## 2023-09-20 ENCOUNTER — Other Ambulatory Visit: Payer: Self-pay | Admitting: *Deleted

## 2023-09-20 NOTE — Progress Notes (Deleted)
Advanced Heart Failure Clinic Note   Referring Physician: recent admit PCP: System, Provider Not In Cardiologist: None   HPI:  Joshua [name] is a 52 y/o male with a history of  2 ED visits 01/24 due to requesting help for detox. Admitted 09/11/23 due to severe chest pain. In the ED patient was somnolent, opened eyes to vigorous sternal rub but did not provide any useful history. Troponin is notably elevated at 276. UDS was notable positive for cocaine. S/p Heparin IV infusion. Troponin peaked 24K, gradually trended down. Cardiac cath, s/p overlap drug-eluting stent placement to the distal and mid left circumflex. Successful angioplasty but very difficult procedure overall due to tortuosity and calcifications as per cardio. 9/30 TTE LVEF 20-25%, global hypokinesis, grade 2 diastolic dysfunction. Patient was on nitroglycerin drip and transferred to stepdown ICU unit for overnight monitoring. IV diuresed. Soft tissue prominence. Incidental on the patient's CT scan. Will need interval outpatient follow-up given his malignancy history.   Echo 07/16/20: EF 25-30% with mild LVH, Grade III DD, akinesis of mid and apical myocardium, mild LAE, moderate Joshua Echo 10/29/21: EF 25-30% with severe hypokinesis of anterior/ anteroseptal/ apical regions, moderate LAE, mild/moderate Joshua Echo 11/04/21: 20-25% with moderate Joshua Echo 09/13/23: EF 20-25% with mild LVH, Grade II DD, severe LAE/RAE, moderate Joshua  LHC 09/09/18: Prox RCA to Mid RCA lesion is 95% stenosed. Mid RCA lesion is 95% stenosed. Dist RCA lesion is 75% stenosed. Prox Cx lesion is 75% stenosed. Mid Cx lesion is 75% stenosed. Prox LAD lesion is 100% stenosed. A drug-eluting stent was successfully placed using a STENT SIERRA 3.00 X 23 MM. Post intervention, there is a 0% residual stenosis. There is moderate to severe left ventricular systolic dysfunction. LV end diastolic pressure is normal. The left ventricular ejection fraction is 25-35% by visual  estimate.  Conclusion STEMI presentation anterior wall Multivessel coronary disease 100% mid LAD TIMI 0 was the culprit lesion IRA Serial disease in the mid circumflex multiple lesions in the mid RCA and distal Distal 100% RCA with collaterals left to right Successful PCI and stent of mid 100% LAD which was the IRA DES stent deployed 3.0 x 23 mm Xience Sierra Postdilated with a 3.25 x 15 mm euphoria to 15 atm TIMI-3 flow was restored from TIMI 0 in the LAD Circumflex and RCA were deferred for possible staged procedure  LHC 09/13/23:   Ost RCA to Prox RCA lesion is 30% stenosed.   Dist RCA lesion is 65% stenosed.   Mid Cx to Dist Cx lesion is 85% stenosed.   Ost LAD to Prox LAD lesion is 30% stenosed.   Prox Cx to Mid Cx lesion is 55% stenosed.   Recommend dual antiplatelet therapy with Aspirin 81mg  daily and Clopidogrel 75mg  daily.  Critical lesion of distal LCX, PCI with DES being deployed. Stent in proximal to mid LAD no significant disease. Distal RCA moderate to severe lesion is diffusedly diseased, treat medically.  Coronary stent intervention 09/13/23:   Mid LAD lesion is 30% stenosed.   Mid Cx to Dist Cx lesion is 90% stenosed.   Mid Cx lesion is 70% stenosed.   2nd Mrg lesion is 30% stenosed.   Prox Cx lesion is 30% stenosed.   A drug-eluting stent was successfully placed using a STENT ONYX FRONTIER 3.0X22.   A drug-eluting stent was successfully placed using a STENT ONYX FRONTIER 3.0X18.   Post intervention, there is a 0% residual stenosis.   Post intervention, there is a 0% residual  stenosis.  Successful angioplasty and to overlap drug-eluting stent placement to the distal and mid left circumflex.  Very difficult procedure overall due to tortuosity and calcifications. Recommendations: Dual antiplatelet therapy for at least 12 months. Aggressive treatment of risk factors.  He presents today for his initial HF visit with a chief complaint of   Review of Systems: [y] =  yes, [ ]  = no   General: Weight gain [ ] ; Weight loss [ ] ; Anorexia [ ] ; Fatigue [ ] ; Fever [ ] ; Chills [ ] ; Weakness [ ]   Cardiac: Chest pain/pressure [ ] ; Resting SOB [ ] ; Exertional SOB [ ] ; Orthopnea [ ] ; Pedal Edema [ ] ; Palpitations [ ] ; Syncope [ ] ; Presyncope [ ] ; Paroxysmal nocturnal dyspnea[ ]   Pulmonary: Cough [ ] ; Wheezing[ ] ; Hemoptysis[ ] ; Sputum [ ] ; Snoring [ ]   GI: Vomiting[ ] ; Dysphagia[ ] ; Melena[ ] ; Hematochezia [ ] ; Heartburn[ ] ; Abdominal pain [ ] ; Constipation [ ] ; Diarrhea [ ] ; BRBPR [ ]   GU: Hematuria[ ] ; Dysuria [ ] ; Nocturia[ ]   Vascular: Pain in legs with walking [ ] ; Pain in feet with lying flat [ ] ; Non-healing sores [ ] ; Stroke [ ] ; TIA [ ] ; Slurred speech [ ] ;  Neuro: Headaches[ ] ; Vertigo[ ] ; Seizures[ ] ; Paresthesias[ ] ;Blurred vision [ ] ; Diplopia [ ] ; Vision changes [ ]   Ortho/Skin: Arthritis [ ] ; Joint pain [ ] ; Muscle pain [ ] ; Joint swelling [ ] ; Back Pain [ ] ; Rash [ ]   Psych: Depression[ ] ; Anxiety[ ]   Heme: Bleeding problems [ ] ; Clotting disorders [ ] ; Anemia [ ]   Endocrine: Diabetes [ ] ; Thyroid dysfunction[ ]    Past Medical History:  Diagnosis Date   Abrasion of toe with infection, right, subsequent encounter 03/27/2021   CHF (congestive heart failure) (HCC)    Diabetes mellitus without complication (HCC)    Hypertension     Current Outpatient Medications  Medication Sig Dispense Refill   aspirin EC 81 MG tablet Take 1 tablet (81 mg total) by mouth daily. Swallow whole. 30 tablet 12   atorvastatin (LIPITOR) 80 MG tablet Take 1 tablet (80 mg total) by mouth daily. 30 tablet 11   carvedilol (COREG) 6.25 MG tablet Take 1 tablet (6.25 mg total) by mouth 2 (two) times daily with a meal. 60 tablet 11   clopidogrel (PLAVIX) 75 MG tablet Take 1 tablet (75 mg total) by mouth daily with breakfast. 30 tablet 11   dapagliflozin propanediol (FARXIGA) 10 MG TABS tablet Take 1 tablet (10 mg total) by mouth daily. 30 tablet 11   furosemide (LASIX) 20 MG  tablet Take 1 tablet (20 mg total) by mouth daily as needed (for weight gain greater than 3 pounds in 1 day and  greater than 5 pounds in 2 days). 30 tablet 1   hydrocortisone (ANUSOL-HC) 25 MG suppository Insert 1 suppository (25 mg total) rectally 2 (two) times daily as needed for hemorrhoids or anal itching. 15 suppository 0   insulin glargine (LANTUS) 100 UNIT/ML Solostar Pen Inject 40 Units into the skin daily. 15 mL 11   Insulin Pen Needle (PEN NEEDLES 31GX5/16") 31G X 8 MM MISC 1 Needle by Does not apply route daily. 100 each 1   losartan (COZAAR) 25 MG tablet Take 1 tablet (25 mg total) by mouth daily. 30 tablet 11   metFORMIN (GLUCOPHAGE) 500 MG tablet Take 1 tablet (500 mg total) by mouth daily with breakfast. 30 tablet 11   polyethylene glycol (MIRALAX / GLYCOLAX) 17 g packet Mix  with water and take 17 grams (1 capful) by mouth daily. 14 each 0   spironolactone (ALDACTONE) 25 MG tablet Take 1 tablet (25 mg total) by mouth daily. 30 tablet 11   No current facility-administered medications for this visit.    Allergies  Allergen Reactions   Penicillins Other (See Comments)    Patient unsure of allergy      Social History   Socioeconomic History   Marital status: Single    Spouse name: Not on file   Number of children: Not on file   Years of education: Not on file   Highest education level: Not on file  Occupational History   Not on file  Tobacco Use   Smoking status: Every Day    Current packs/day: 0.50    Types: Cigarettes   Smokeless tobacco: Never  Vaping Use   Vaping status: Never Used  Substance and Sexual Activity   Alcohol use: Yes    Alcohol/week: 1.0 standard drink of alcohol    Types: 1 Shots of liquor per week    Comment: once a year   Drug use: Yes    Comment: occasionally and in the past "whatever I could get"   Sexual activity: Yes    Partners: Female  Other Topics Concern   Not on file  Social History Narrative   Not on file   Social  Determinants of Health   Financial Resource Strain: High Risk (04/10/2022)   Received from [facility], [facility]   Overall Financial Resource Strain (CARDIA)    Difficulty of Paying Living Expenses: Very hard  Food Insecurity: Food Insecurity Present (09/12/2023)   Hunger Vital Sign    Worried About Running Out of Food in the Last Year: Often true    Ran Out of Food in the Last Year: Often true  Transportation Needs: Unmet Transportation Needs (09/12/2023)   PRAPARE - Administrator, Civil Service (Medical): Yes    Lack of Transportation (Non-Medical): Yes  Physical Activity: Not on file  Stress: Stress Concern Present (12/19/2021)   Received from [facility], [facility]   [employer] of Occupational Health - Occupational Stress Questionnaire    Feeling of Stress : Rather much  Social Connections: Not on file  Intimate Partner Violence: Not At Risk (09/12/2023)   Humiliation, Afraid, Rape, and Kick questionnaire    Fear of Current or Ex-Partner: No    Emotionally Abused: No    Physically Abused: No    Sexually Abused: No      Family History  Problem Relation Age of Onset   Cancer Mother    CAD Brother        PHYSICAL EXAM: General:  Well appearing. No respiratory difficulty HEENT: normal Neck: supple. no JVD. No lymphadenopathy or thyromegaly appreciated. Cor: PMI nondisplaced. Regular rate & rhythm. No rubs, gallops or murmurs. Lungs: clear Abdomen: soft, nontender, nondistended. No hepatosplenomegaly. No bruits or masses.  Extremities: no cyanosis, clubbing, rash, edema Neuro: alert & oriented x 3, cranial nerves grossly intact. moves all 4 extremities w/o difficulty. Affect pleasant.  ECG:   ASSESSMENT & PLAN:  1: Chronic heart failure with reduced ejection fraction- - suspect due to - NYHA class - euvolemic - weighing daily - Echo 07/16/20: EF 25-30% with mild LVH, Grade III DD, akinesis of mid and apical myocardium, mild LAE,  moderate Joshua - Echo 10/29/21: EF 25-30% with severe hypokinesis of anterior/ anteroseptal/ apical regions, moderate LAE, mild/moderate Joshua -  Echo 11/04/21: 20-25% with moderate Joshua - Echo 09/13/23: EF 20-25% with mild LVH, Grade II DD, severe LAE/RAE, moderate Joshua - continue  - BNP  2: HTN- - BP - saw PCP - BMP  3: CAD- - STEMI in 2019 with PCI  - Coronary stent intervention 09/13/23:   Mid LAD lesion is 30% stenosed.   Mid Cx to Dist Cx lesion is 90% stenosed.   Mid Cx lesion is 70% stenosed.   2nd Mrg lesion is 30% stenosed.   Prox Cx lesion is 30% stenosed.   A drug-eluting stent was successfully placed using a STENT ONYX FRONTIER 3.0X22.   A drug-eluting stent was successfully placed using a STENT ONYX FRONTIER 3.0X18.   Post intervention, there is a 0% residual stenosis.   Post intervention, there is a 0% residual stenosis.  Successful angioplasty and to overlap drug-eluting stent placement to the distal and mid left circumflex.  Very difficult procedure overall due to tortuosity and calcifications.  4: DM-    Delma Freeze, FNP 09/20/23

## 2023-09-21 ENCOUNTER — Telehealth: Payer: Self-pay | Admitting: Family

## 2023-09-21 ENCOUNTER — Encounter: Payer: 59 | Admitting: Family

## 2023-09-21 NOTE — Telephone Encounter (Signed)
Patient did not show for his initial Heart Failure Clinic appointment on 09/21/23.

## 2023-09-28 ENCOUNTER — Other Ambulatory Visit: Payer: Self-pay

## 2023-10-07 NOTE — Plan of Care (Signed)
[facility] Tonsillectomy/Adenoidectomy, Postoperative PEDS care plan entered in error.

## 2023-10-14 ENCOUNTER — Other Ambulatory Visit: Payer: Self-pay

## 2023-10-14 ENCOUNTER — Inpatient Hospital Stay: Payer: 59

## 2023-10-14 ENCOUNTER — Emergency Department: Payer: 59

## 2023-10-14 ENCOUNTER — Inpatient Hospital Stay
Admission: EM | Admit: 2023-10-14 | Discharge: 2023-10-15 | DRG: 917 | Disposition: E | Payer: 59 | Attending: Student in an Organized Health Care Education/Training Program | Admitting: Student in an Organized Health Care Education/Training Program

## 2023-10-14 DIAGNOSIS — Z89421 Acquired absence of other right toe(s): Secondary | ICD-10-CM

## 2023-10-14 DIAGNOSIS — I110 Hypertensive heart disease with heart failure: Secondary | ICD-10-CM | POA: Diagnosis present

## 2023-10-14 DIAGNOSIS — I469 Cardiac arrest, cause unspecified: Secondary | ICD-10-CM | POA: Diagnosis present

## 2023-10-14 DIAGNOSIS — E119 Type 2 diabetes mellitus without complications: Secondary | ICD-10-CM | POA: Diagnosis present

## 2023-10-14 DIAGNOSIS — Z8249 Family history of ischemic heart disease and other diseases of the circulatory system: Secondary | ICD-10-CM | POA: Diagnosis not present

## 2023-10-14 DIAGNOSIS — J9602 Acute respiratory failure with hypercapnia: Secondary | ICD-10-CM | POA: Diagnosis present

## 2023-10-14 DIAGNOSIS — Z7902 Long term (current) use of antithrombotics/antiplatelets: Secondary | ICD-10-CM | POA: Diagnosis not present

## 2023-10-14 DIAGNOSIS — I5022 Chronic systolic (congestive) heart failure: Secondary | ICD-10-CM | POA: Diagnosis present

## 2023-10-14 DIAGNOSIS — J9621 Acute and chronic respiratory failure with hypoxia: Secondary | ICD-10-CM | POA: Diagnosis not present

## 2023-10-14 DIAGNOSIS — Z66 Do not resuscitate: Secondary | ICD-10-CM | POA: Diagnosis present

## 2023-10-14 DIAGNOSIS — I214 Non-ST elevation (NSTEMI) myocardial infarction: Secondary | ICD-10-CM | POA: Diagnosis present

## 2023-10-14 DIAGNOSIS — Z7984 Long term (current) use of oral hypoglycemic drugs: Secondary | ICD-10-CM

## 2023-10-14 DIAGNOSIS — Z880 Allergy status to penicillin: Secondary | ICD-10-CM

## 2023-10-14 DIAGNOSIS — I2510 Atherosclerotic heart disease of native coronary artery without angina pectoris: Secondary | ICD-10-CM | POA: Diagnosis present

## 2023-10-14 DIAGNOSIS — Z515 Encounter for palliative care: Secondary | ICD-10-CM

## 2023-10-14 DIAGNOSIS — F17210 Nicotine dependence, cigarettes, uncomplicated: Secondary | ICD-10-CM | POA: Diagnosis present

## 2023-10-14 DIAGNOSIS — I252 Old myocardial infarction: Secondary | ICD-10-CM | POA: Diagnosis not present

## 2023-10-14 DIAGNOSIS — F1410 Cocaine abuse, uncomplicated: Secondary | ICD-10-CM | POA: Diagnosis present

## 2023-10-14 DIAGNOSIS — Z79899 Other long term (current) drug therapy: Secondary | ICD-10-CM

## 2023-10-14 DIAGNOSIS — F1910 Other psychoactive substance abuse, uncomplicated: Secondary | ICD-10-CM

## 2023-10-14 DIAGNOSIS — I255 Ischemic cardiomyopathy: Secondary | ICD-10-CM | POA: Diagnosis present

## 2023-10-14 DIAGNOSIS — G931 Anoxic brain damage, not elsewhere classified: Secondary | ICD-10-CM | POA: Diagnosis present

## 2023-10-14 DIAGNOSIS — G936 Cerebral edema: Secondary | ICD-10-CM | POA: Diagnosis present

## 2023-10-14 DIAGNOSIS — J9622 Acute and chronic respiratory failure with hypercapnia: Secondary | ICD-10-CM

## 2023-10-14 DIAGNOSIS — I441 Atrioventricular block, second degree: Secondary | ICD-10-CM | POA: Diagnosis present

## 2023-10-14 DIAGNOSIS — N179 Acute kidney failure, unspecified: Secondary | ICD-10-CM | POA: Diagnosis present

## 2023-10-14 DIAGNOSIS — I468 Cardiac arrest due to other underlying condition: Secondary | ICD-10-CM | POA: Diagnosis present

## 2023-10-14 DIAGNOSIS — T405X1A Poisoning by cocaine, accidental (unintentional), initial encounter: Secondary | ICD-10-CM | POA: Diagnosis present

## 2023-10-14 DIAGNOSIS — Z794 Long term (current) use of insulin: Secondary | ICD-10-CM | POA: Diagnosis not present

## 2023-10-14 DIAGNOSIS — Z7982 Long term (current) use of aspirin: Secondary | ICD-10-CM | POA: Diagnosis not present

## 2023-10-14 DIAGNOSIS — J9601 Acute respiratory failure with hypoxia: Secondary | ICD-10-CM | POA: Diagnosis present

## 2023-10-14 DIAGNOSIS — Z955 Presence of coronary angioplasty implant and graft: Secondary | ICD-10-CM

## 2023-10-14 LAB — URINE DRUG SCREEN, QUALITATIVE (ARMC ONLY)
Amphetamines, Ur Screen: NOT DETECTED
Barbiturates, Ur Screen: NOT DETECTED
Benzodiazepine, Ur Scrn: NOT DETECTED
Cannabinoid 50 Ng, Ur ~~LOC~~: POSITIVE — AB
Cocaine Metabolite,Ur ~~LOC~~: POSITIVE — AB
MDMA (Ecstasy)Ur Screen: NOT DETECTED
Methadone Scn, Ur: NOT DETECTED
Opiate, Ur Screen: NOT DETECTED
Phencyclidine (PCP) Ur S: NOT DETECTED
Tricyclic, Ur Screen: NOT DETECTED

## 2023-10-14 LAB — BASIC METABOLIC PANEL
Anion gap: 16 — ABNORMAL HIGH (ref 5–15)
BUN: 18 mg/dL (ref 6–20)
CO2: 24 mmol/L (ref 22–32)
Calcium: 9.6 mg/dL (ref 8.9–10.3)
Chloride: 111 mmol/L (ref 98–111)
Creatinine, Ser: 1.73 mg/dL — ABNORMAL HIGH (ref 0.61–1.24)
GFR, Estimated: 47 mL/min — ABNORMAL LOW (ref >60)
Glucose, Bld: 241 mg/dL — ABNORMAL HIGH (ref 70–99)
Potassium: 4.9 mmol/L (ref 3.5–5.1)
Sodium: 151 mmol/L — ABNORMAL HIGH (ref 135–145)

## 2023-10-14 LAB — BLOOD GAS, ARTERIAL
Acid-base deficit: 9.6 mmol/L — ABNORMAL HIGH (ref 0.0–2.0)
Bicarbonate: 22.7 mmol/L (ref 20.0–28.0)
FIO2: 100 %
MECHVT: 460 mL
Mechanical Rate: 22
O2 Saturation: 54.4 %
PEEP: 10 cmH2O
Patient temperature: 36
pCO2 arterial: 80 mm[Hg] (ref 32–48)
pH, Arterial: 7.05 — CL (ref 7.35–7.45)
pO2, Arterial: 35 mm[Hg] — CL (ref 83–108)

## 2023-10-14 LAB — MRSA NEXT GEN BY PCR, NASAL: MRSA by PCR Next Gen: NOT DETECTED

## 2023-10-14 LAB — URINALYSIS, W/ REFLEX TO CULTURE (INFECTION SUSPECTED)
Bacteria, UA: NONE SEEN
Bilirubin Urine: NEGATIVE
Glucose, UA: >=500 mg/dL — AB
Hgb urine dipstick: NEGATIVE
Ketones, ur: NEGATIVE mg/dL
Leukocytes,Ua: NEGATIVE
Nitrite: NEGATIVE
Protein, ur: NEGATIVE mg/dL
Specific Gravity, Urine: 1.027 (ref 1.005–1.030)
pH: 5 (ref 5.0–8.0)

## 2023-10-14 LAB — CBG MONITORING, ED: Glucose-Capillary: 193 mg/dL — ABNORMAL HIGH (ref 70–99)

## 2023-10-14 LAB — CBC
HCT: 36.7 % — ABNORMAL LOW (ref 39.0–52.0)
Hemoglobin: 11.3 g/dL — ABNORMAL LOW (ref 13.0–17.0)
MCH: 30.1 pg (ref 26.0–34.0)
MCHC: 30.8 g/dL (ref 30.0–36.0)
MCV: 97.6 fL (ref 80.0–100.0)
Platelets: 174 10*3/uL (ref 150–400)
RBC: 3.76 MIL/uL — ABNORMAL LOW (ref 4.22–5.81)
RDW: 14.2 % (ref 11.5–15.5)
WBC: 8.3 10*3/uL (ref 4.0–10.5)
nRBC: 0.4 % — ABNORMAL HIGH (ref 0.0–0.2)

## 2023-10-14 LAB — TROPONIN I (HIGH SENSITIVITY): Troponin I (High Sensitivity): 39 ng/L — ABNORMAL HIGH (ref <18)

## 2023-10-14 LAB — GLUCOSE, CAPILLARY: Glucose-Capillary: 145 mg/dL — ABNORMAL HIGH (ref 70–99)

## 2023-10-14 LAB — SALICYLATE LEVEL: Salicylate Lvl: <7 mg/dL — ABNORMAL LOW (ref 7.0–30.0)

## 2023-10-14 LAB — ACETAMINOPHEN LEVEL: Acetaminophen (Tylenol), Serum: <10 ug/mL — ABNORMAL LOW (ref 10–30)

## 2023-10-14 MED ORDER — EPINEPHRINE HCL 5 MG/250ML IV SOLN IN NS
0.5000 ug/min | INTRAVENOUS | Status: DC
  Administered 2023-10-14: 10 ug/min via INTRAVENOUS

## 2023-10-14 MED ORDER — SODIUM BICARBONATE 8.4 % IV SOLN
INTRAVENOUS | Status: AC | PRN
Start: 2023-10-14 — End: 2023-10-14
  Administered 2023-10-14 (×3): 50 meq via INTRAVENOUS

## 2023-10-14 MED ORDER — EPINEPHRINE 1 MG/10ML IJ SOSY
PREFILLED_SYRINGE | INTRAMUSCULAR | Status: AC | PRN
  Administered 2023-10-14 (×2): 1 mg via INTRAVENOUS

## 2023-10-14 MED ORDER — NOREPINEPHRINE 4 MG/250ML-% IV SOLN
0.0000 ug/min | INTRAVENOUS | Status: DC
  Administered 2023-10-14: 20 ug/min via INTRAVENOUS
  Administered 2023-10-14 (×2): 10 ug/min via INTRAVENOUS

## 2023-10-14 MED ORDER — SODIUM BICARBONATE 8.4 % IV SOLN
INTRAVENOUS | Status: AC
  Filled 2023-10-14: qty 50

## 2023-10-14 MED ORDER — EPINEPHRINE 1 MG/10ML IJ SOSY
PREFILLED_SYRINGE | INTRAMUSCULAR | Status: AC | PRN
  Administered 2023-10-14: 1 mg via INTRAVENOUS

## 2023-10-14 MED ORDER — SODIUM CHLORIDE 0.9 % IV BOLUS
1000.0000 mL | Freq: Once | INTRAVENOUS | Status: AC
  Administered 2023-10-14: 1000 mL via INTRAVENOUS

## 2023-10-14 MED ORDER — SODIUM CHLORIDE 0.9 % IV SOLN
3.0000 g | Freq: Four times a day (QID) | INTRAVENOUS | Status: DC
  Administered 2023-10-14: 3 g via INTRAVENOUS
  Filled 2023-10-14 (×2): qty 8

## 2023-10-14 MED ORDER — CALCIUM CHLORIDE 10 % IV SOLN
INTRAVENOUS | Status: AC | PRN
Start: 2023-10-14 — End: 2023-10-14
  Administered 2023-10-14: 1 g via INTRAVENOUS

## 2023-10-14 MED ORDER — DOCUSATE SODIUM 100 MG PO CAPS: 100.0000 mg | ORAL_CAPSULE | Freq: Two times a day (BID) | ORAL | Status: DC | PRN

## 2023-10-14 MED ORDER — HEPARIN SODIUM (PORCINE) 5000 UNIT/ML IJ SOLN: 5000.0000 [IU] | Freq: Three times a day (TID) | INTRAMUSCULAR | Status: DC

## 2023-10-14 MED ORDER — INSULIN ASPART 100 UNIT/ML IJ SOLN: 1.0000 [IU] | INTRAMUSCULAR | Status: DC

## 2023-10-14 MED ORDER — ATROPINE SULFATE 1 MG/10ML IJ SOSY
PREFILLED_SYRINGE | INTRAMUSCULAR | Status: AC | PRN
  Administered 2023-10-14 (×2): 1 mg via INTRAVENOUS

## 2023-10-14 MED ORDER — EPINEPHRINE HCL 5 MG/250ML IV SOLN IN NS
INTRAVENOUS | Status: AC
  Filled 2023-10-14: qty 250

## 2023-10-14 MED ORDER — CALCIUM CHLORIDE 10 % IV SOLN
INTRAVENOUS | Status: AC | PRN
  Administered 2023-10-14: 1 g via INTRAVENOUS

## 2023-10-14 MED ORDER — POLYETHYLENE GLYCOL 3350 17 G PO PACK: 17.0000 g | PACK | Freq: Every day | ORAL | Status: DC | PRN

## 2023-10-14 MED ORDER — SODIUM CHLORIDE 0.9 % IV SOLN
INTRAVENOUS | Status: AC | PRN
Start: 2023-10-14 — End: 2023-10-14
  Administered 2023-10-14: 1000 mL via INTRAVENOUS

## 2023-10-14 MED ORDER — SODIUM BICARBONATE 8.4 % IV SOLN
INTRAVENOUS | Status: AC | PRN
  Administered 2023-10-14: 50 meq via INTRAVENOUS

## 2023-10-15 NOTE — ED Notes (Signed)
Per Md start LUCUS

## 2023-10-15 NOTE — IPAL (Signed)
  Interdisciplinary Goals of Care Family Meeting   Date carried out:   Location of the meeting: Phone conference  Member's involved: Physician and Family Member or next of kin  Durable Power of Attorney or acting medical decision maker: Step brother, Elpidio Anis  Discussion: We discussed goals of care for [employer]. I explained to Mr. Wendi Maya that Mr. Wirtanen suffered a prolonged cardiac arrest with very poor prognosis given physical exam findings showing dilated and fixed pupils. Mr. Wendi Maya is the patient's  step brother, and the patient has two adult children (daughter and a son) with whom he hasn't had contact and will try to communicate with. I explained that prognosis is very grim. They will attempt to present to the bedside and we will discuss this further.  Code status:   Code Status: Full Code   Disposition: Continue current acute care  Time spent for the meeting: 15 minutes    Raechel Chute, MD  , 9:52 AM

## 2023-10-15 NOTE — IPAL (Signed)
  Interdisciplinary Goals of Care Family Meeting   Date carried out:   Location of the meeting: Phone conference  Member's involved: Physician and Family Member or next of kin Joshua [name] and Joshua [name] Power of Attorney or acting medical decision maker: Joshua [name] and Joshua Alfredo Martinez    Discussion: We discussed goals of care for Joshua [name] .  Explained that he suffered a cardiac arrest with at least 50 minutes of down time. Patient was undergoing CPR at the time of our conversation. Explained that he has signs of catastrophic anoxic brain injury, with no hope of neurological recovery. They understand and would like to change goals of care to comfort measures and withdraw care. Will contact [employer] bank to notify them.  Code status:   Code Status: Full Code   Disposition: In-patient comfort care  Time spent for the meeting: 15 minutes    Raechel Chute, MD  , 12:34 PM

## 2023-10-15 NOTE — Progress Notes (Signed)
Pt arrived on unit at approx. 1000 am. Post cardiac arrest pt. Pt ventilated, no pupils, no corneals, no gag and no sedation at the moment. [facility] notified of GCS of 3.   1211 Rhythm changed noted on monitor, MD notified, PEA, compressions started.   During code blue event, MD at bedside and medications (noriepi and levo) titrated per verbal orders.   1215 Palpable pulse. Family at bedside, per MD pt DNR. [facility] notified of status change, awaiting call from [facility] rep.   1244 Rhythm change noted on monitor, pt bradycardiac, MD notified and at bedside.   1248 TOD. MD at bedside. [facility] notified, charge and [facility] notified. Family notified.

## 2023-10-15 NOTE — Death Summary Note (Signed)
DEATH SUMMARY   Patient Details  Name: Joshua [name] MRN: [id number] DOB: 01-05-1971  Admission/Discharge Information   Admit Date:  Oct 28, 2023  Date of Death: 10-28-2023   Time of Death: 12:48 PM   Length of Stay: 0  Referring Physician: System, Provider Not In   Reason(s) for Hospitalization  Cardiac Arrest  Diagnoses  Preliminary cause of death:  Secondary Diagnoses (including complications and co-morbidities):  Principal Problem:   Cardiac arrest [facility])   Brief Hospital Course (including significant findings, care, treatment, and services provided and events leading to death)  Terrius Vitor Sondgeroth is a 52 y.o. year old male who has a history of HFrEF, NSTEMI, and cocaine abuse who presented following a cardiac arrest. He was found down at home and underwent a total of 50 minutes of CPR between EMS and the ED. On presentation to the ICU, pupils were fixed and non-reactive, CT head with diffuse anoxic brain injury. He arrested again in the ICU and ROSC obtained. Discussed his overall trajectory of care with his son and daughter, and explained diffuse anoxic injury. Code status changed to DNR/DNI with plan for comfort measures. Patient then had another cardiac arrest shortly thereafter and passed away peacefully.    Pertinent Labs and Studies  Significant Diagnostic Studies CT HEAD WO CONTRAST ( )  Result Date: 10/28/23 CLINICAL DATA:  Anoxic brain injury EXAM: CT HEAD WITHOUT CONTRAST TECHNIQUE: Contiguous axial images were obtained from the base of the skull through the vertex without intravenous contrast. RADIATION DOSE REDUCTION: This exam was performed according to the departmental dose-optimization program which includes automated exposure control, adjustment of the mA and/or kV according to patient size and/or use of iterative reconstruction technique. COMPARISON:  CT head 09/11/2023 FINDINGS: Brain: There is diffuse loss of gray-white differentiation in the  supratentorial brain with diffuse sulcal effacement, partial effacement of the basal cisterns, and partial effacement of the ventricular system. Findings are consistent with severe anoxic brain injury. There is no acute intracranial hemorrhage or extra-axial fluid collection. There is no evidence of herniation at this time. There is no midline shift. Vascular: No hyperdense vessel or unexpected calcification. Skull: Normal. Negative for fracture or focal lesion. Sinuses/Orbits: There is mild mucosal thickening throughout the paranasal sinuses. The globes and orbits are unremarkable. Other: The mastoid air cells and middle ear cavities are clear. IMPRESSION: Findings above consistent with severe anoxic brain injury. Electronically Signed   By: Lesia Hausen M.D.   On: 10-28-2023 10:46   DG Abdomen 1 View  Result Date: 10/28/23 CLINICAL DATA:  other EXAM: ABDOMEN - 1 VIEW COMPARISON:  11/16/2021. FINDINGS: There is increased amount of gas throughout the stomach and colonic loops, likely manifestation of adynamic ileus. Consider radiographic follow up if clinically indicated. Bowel anastomotic suture noted overlying the right midabdomen. There are several air-filled small bowel loops overlying the right lower quadrant. No evidence of pneumoperitoneum, within the limitations of a supine film. No acute osseous abnormalities. The soft tissues are within normal limits. Surgical changes, devices, tubes and lines: None. IMPRESSION: *Increased amount of gas throughout the stomach and colon likely manifestation of adynamic ileus. Correlate clinically to determine the need for additional imaging with contrast-enhanced CT scan abdomen and pelvis. Electronically Signed   By: Jules Schick M.D.   On: 10/28/2023 10:45   DG Chest Port 1 View  Result Date: 2023-10-28 CLINICAL DATA:  [id number] Endotracheally intubated [id number]. EXAM: PORTABLE CHEST 1 VIEW COMPARISON:  03/11/2021. FINDINGS: Low lung volume. Interval placement  of endotracheal  tube with its tip approximately 4.3 cm above the carina. Interval placement of enteric tube with its tip and side hole overlying the lower thoracic esophagus. Further advancement by 8-10 inches is recommended. There are additional presumed drainage catheters overlying the cardiac shadow. Mild diffuse interstitial prominence. It is uncertain whether this represents artifactual "crowding" of the interstitium related to suboptimal inspiration versus true infiltrate. Bilateral lung fields are otherwise clear. Bilateral lateral costophrenic angles are clear. Stable cardio-mediastinal silhouette. No acute osseous abnormalities. The soft tissues are within normal limits. IMPRESSION: *Interval placement of endotracheal tube, which appears in satisfactory position. *Suboptimally positioned enteric tube. Please see above for details. *Mild diffuse interstitial prominence, which may be accentuated by low lung volume. Correlate clinically to exclude superimposed pneumonitis. Electronically Signed   By: Jules Schick M.D.   On: 11-05-23 10:42    Microbiology No results found for this or any previous visit (from the past 240 hour(s)).  Lab Basic Metabolic Panel: Recent Labs  Lab November 05, 2023 0809  NA 151*  K 4.9  CL 111  CO2 24  GLUCOSE 241*  BUN 18  CREATININE 1.73*  CALCIUM 9.6   Liver Function Tests: No results for input(s): "AST", "ALT", "ALKPHOS", "BILITOT", "PROT", "ALBUMIN" in the last 168 hours. No results for input(s): "LIPASE", "AMYLASE" in the last 168 hours. No results for input(s): "AMMONIA" in the last 168 hours. CBC: Recent Labs  Lab 2023-11-05 0809  WBC 8.3  HGB 11.3*  HCT 36.7*  MCV 97.6  PLT 174   Cardiac Enzymes: No results for input(s): "CKTOTAL", "CKMB", "CKMBINDEX", "TROPONINI" in the last 168 hours. Sepsis Labs: Recent Labs  Lab 11/05/2023 0809  WBC 8.3     Quasim Doyon 11-05-23, 1:21 PM

## 2023-10-15 NOTE — Progress Notes (Signed)
: 1515. Spoke with daughter Janeal Holmes. She stated she had not decided on a funeral home yet. Mentioned her father had some jewelry in the safe and what would she like [place] to do with it. She stated she would talk with relatives and figure out what to do, but for right now, she would have it held in the safe. I spoke with Sherrie George, ME. She stated we could release jewelry to family.

## 2023-10-15 NOTE — ED Notes (Signed)
Per MD stop IV fluid bolus

## 2023-10-15 NOTE — ED Notes (Signed)
MD paduchowski at bedside with [place].

## 2023-10-15 NOTE — ED Notes (Signed)
Cbg 193

## 2023-10-15 NOTE — ED Notes (Signed)
RN and this RN could not palpate pulsdes. MD staffo

## 2023-10-15 NOTE — ED Notes (Signed)
Xray called to get xrays portable.

## 2023-10-15 NOTE — Progress Notes (Signed)
    1300  Spiritual Encounters  Type of Visit Initial  Care provided to: Pt and family  Conversation partners present during encounter Nurse;Physician  Referral source Code page  Reason for visit Patient death  OnCall Visit Yes   Chaplain received a code Blue in the ICU. Chaplain met with patient's son and the doctor. Son had to make the decision to stop compressions. Chaplain prayed over the patients body and helped the son with paperwork and next steps. Son thanked the chaplain for being there with him.

## 2023-10-15 NOTE — ED Notes (Signed)
Pulses palpable by kelly RN at this time. Xray to bedside

## 2023-10-15 NOTE — ED Triage Notes (Signed)
Pt to ED via [facility] for cardiac arrest. Pt was found down for an unknown amount of time. Per EMS pt was warm on their arrival. Pt's girlfriend found pt and called EMS, girlfriend reported that he was "smoking crack last night". Upon EMS arrival pt was in asystole and they initated CPR. EMS got pulses back for approximately 8 minutes and pt went into PEA. CPR in progress on arrival to ED. Per EMS before getting pulses back, EMS gave EPIx3, narcan, bicarb, and calcium. Upon losing pulses EMS gave another EPIx3 and gave a total of IV fluid. EMS does report that pt's abdomen became distended and was not distended on arrival. Pt has extensive cardiac hx with multiple stent placement.

## 2023-10-15 NOTE — ED Notes (Signed)
MD talking to family of pt at this time

## 2023-10-15 NOTE — ED Notes (Signed)
Md stafford given EKG by EDT

## 2023-10-15 NOTE — H&P (Signed)
NAME:  Joshua [name], MRN:  [id number], DOB:  August 26, 1971, LOS: 0 ADMISSION DATE:  November 11, 2023, CONSULTATION DATE:  11/11/2023 REFERRING MD:  Sharman Cheek, CHIEF COMPLAINT:  Cardiac Arrest   History of Present Illness:   Patient is a 52 year old male with a past medical history of CAD presenting to the hospital following a cardiac arrest.  History is limited and was obtained from collateral via chart review.    EMS was activated earlier today following cardiac arrest at home.  On arrival, he was in asystole and ACLS was activated and the patient was intubated in the field.  ROSC was achieved and patient transferred to the emergency department.  He suffered cardiac arrest on presentation to the ED with multiple rounds of ACLS performed.  Total downtime in the ED was at least 40 minutes, with up to 10 minutes of downtime in the field.  Patient was started on norepinephrine and epinephrine drips and is being admitted to the ICU for further care. Of note, patient had a positive urinary tox screen with cocaine and marijuana.  Patient has an extensive history of coronary artery disease with STEMI in 2019 and was recently admitted to the hospital in September with NSTEMI.  He also has underlying HFrEF with a EF of 20% as recently as September of this year.  Cardiac cath in September 2024 showed left circumflex stenosis requiring placement of a drug-eluting stent.    Pertinent  Medical History  -CAD -HFrEF -Hypertension -Diabetes   Objective   Blood pressure (!) 180/123, pulse (!) 117, temperature (!) 94.4 F (34.7 C), resp. rate 19, height 5\' 10"  (1.778 m), weight 82.5 kg, SpO2 95%.    Vent Mode: PRVC FiO2 (%):  [100 %] 100 % Set Rate:  [22 bmp] 22 bmp Vt Set:  [460 mL] 460 mL PEEP:  [10 cmH20] 10 cmH20  No intake or output data in the 24 hours ending 11/11/2023 0935 Filed Weights   November 11, 2023 0921  Weight: 82.5 kg    Examination: Physical Exam Constitutional:      General: He  is not in acute distress.    Appearance: He is ill-appearing.  Cardiovascular:     Rate and Rhythm: Normal rate and regular rhythm.     Pulses: Normal pulses.     Heart sounds: Normal heart sounds.  Pulmonary:     Comments: Ventilated breath sounds bilaterally Neurological:     Comments: Unresponsive, pupils bilaterally dilated and are non-reactive. No vestibulo-ocular reflex, no gag reflex     Assessment & Plan:   Neurology #Anoxic Brain Damage  Presenting following a cardiac arrest with at least 45 minutes of CPR and downtime.  Physical exam is extremely discouraging with bilaterally dilated and fixed pupils and no brainstem reflexes.  While we would generally wait 72 hours prior to prognostication, my impression is that the patient has suffered severe anoxic brain damage secondary to his cardiac arrest.  Will obtain a CT scan of the head to assess for any loss of gray-white matter differentiation as well as cerebral edema.  I have discussed the patient's grim prognosis with his stepbrother who will try to contact the patient's biological children.  Cardiovascular # Cardiac arrest # NSTEMI # Cocaine abuse # HFrEF # Cardiogenic shock  Presenting following a cardiac arrest in the field likely triggered by cocaine abuse in the setting of severe ischemic cardiomyopathy with the most recent ejection fraction being 20% in September as well as severe coronary artery disease.  No STEMI  noted on patient's presentation and emergent intervention was deferred.  He did suffer prolonged cardiac arrest and has required initiation of norepinephrine and epinephrine given his severe hypertension and cardiac shock.  Bedside point-of-care ultrasound performed by me shows atrial ventricular failure with global hypokinesis and dilated left ventricle.  There was no pericardial effusion or RV dilation to suggest obstructive shock.  Patient with very grim prognosis but should he survive, he will need  goal-directed medical therapy, heparin drip, and potentially repeat left heart cath.  Will await on any further interventions pending family discussion.   Pulmonary # Acute hypoxic and hypercapnic respiratory failure  Presented with cardiac arrest requiring intubation.  Blood gas obtained after intubation showed a pH of 7.05 with a CO2 of 80 which we will optimize with increased minute ventilation.  This is in the setting of biventricular cardiac failure and cardiac arrest.  I have reviewed his previous chest imaging showing bilateral bronchial wall thickening but otherwise no sign of interstitial lung disease.  -Full vent support, implement lung protective strategies -Plateau pressures less than 30 cm H20 -Wean FiO2 & PEEP as tolerated to maintain O2 sats >92% -Follow intermittent Chest X-ray & ABG as needed -Spontaneous Breathing Trials when respiratory parameters met and mental status permits -Implement VAP Bundle -Prn Bronchodilators  Gastrointestinal  PPI for SUP, NPO for now  Renal #AKI  AKI in the setting of cardiac arrest and severe hypoperfusion. Will avoid nephrotoxins and continue to monitor. Would consider a bicarbonate gtt if remains acidemic following optimization of his minute ventilation  Endocrine  ICU glycemic protocol with sliding scale  Hem/Onc  Heparin subQ for prophylaxis, will consider initiation of heparin gtt  ID  Unasyn for prophylaxis against VAP per ANTHRATIC study   Best Practice (right click and "Reselect all SmartList Selections" daily)   Diet/type: NPO DVT prophylaxis: prophylactic heparin  GI prophylaxis: PPI Lines: N/A Foley:  Yes, and it is still needed Code Status:  full code - prognosis very guarded Last date of multidisciplinary goals of care discussion [11/08/23]  Labs   CBC: Recent Labs  Lab 11-08-23 0809  WBC 8.3  HGB 11.3*  HCT 36.7*  MCV 97.6  PLT 174    Basic Metabolic Panel: Recent Labs  Lab 2023-11-08 0809   NA 151*  K 4.9  CL 111  CO2 24  GLUCOSE 241*  BUN 18  CREATININE 1.73*  CALCIUM 9.6   GFR: Estimated Creatinine Clearance: 51.6 mL/min (A) (by C-G formula based on SCr of 1.73 mg/dL (H)). Recent Labs  Lab 11/08/2023 0809  WBC 8.3    Liver Function Tests: No results for input(s): "AST", "ALT", "ALKPHOS", "BILITOT", "PROT", "ALBUMIN" in the last 168 hours. No results for input(s): "LIPASE", "AMYLASE" in the last 168 hours. No results for input(s): "AMMONIA" in the last 168 hours.  ABG    Component Value Date/Time   PHART 7.45 10/04/2020 1045   PCO2ART 37 10/04/2020 1045   PO2ART 56 (L) 10/04/2020 1045   HCO3 25.8 09/11/2023 2028   ACIDBASEDEF 0.7 09/11/2023 2028   O2SAT 48.3 09/11/2023 2028     Coagulation Profile: No results for input(s): "INR", "PROTIME" in the last 168 hours.  Cardiac Enzymes: No results for input(s): "CKTOTAL", "CKMB", "CKMBINDEX", "TROPONINI" in the last 168 hours.  HbA1C: Hgb A1c MFr Bld  Date/Time Value Ref Range Status  09/11/2023 08:42 PM 10.1 (H) 4.8 - 5.6 % Final    Comment:    (NOTE) Pre diabetes:  5.7%-6.4%  Diabetes:              >6.4%  Glycemic control for   <7.0% adults with diabetes   10/29/2021 07:10 AM 8.5 (H) 4.8 - 5.6 % Final    Comment:    (NOTE) Pre diabetes:          5.7%-6.4%  Diabetes:              >6.4%  Glycemic control for   <7.0% adults with diabetes     CBG: Recent Labs  Lab 10-27-23 0832  GLUCAP 193*    Review of Systems:   Unable to obtain  Past Medical History:  He,  has a past medical history of Abrasion of toe with infection, right, subsequent encounter (03/27/2021), CHF (congestive heart failure) (HCC), Diabetes mellitus without complication (HCC), and Hypertension.   Surgical History:   Past Surgical History:  Procedure Laterality Date   AMPUTATION TOE Right 04/01/2021   Procedure: AMPUTATION TOE;  Surgeon: Linus Galas, DPM;  Location: [facility] ORS;  Service: Podiatry;   Laterality: Right;  RIGHT GREAT TOE   CORONARY STENT INTERVENTION N/A 09/13/2023   Procedure: CORONARY STENT INTERVENTION;  Surgeon: Iran Ouch, MD;  Location: [facility] INVASIVE CV LAB;  Service: Cardiovascular;  Laterality: N/A;   CORONARY/GRAFT ACUTE MI REVASCULARIZATION N/A 09/09/2018   Procedure: Coronary/Graft Acute MI Revascularization;  Surgeon: Alwyn Pea, MD;  Location: [facility] INVASIVE CV LAB;  Service: Cardiovascular;  Laterality: N/A;   LAPAROSCOPY, SURGICAL; COLECTOMY, PARTIAL, WITH REMOVAL OF TERMINAL ILEUM WITH ILEOCOLOSTOMY  11/2021   LEFT HEART CATH AND CORONARY ANGIOGRAPHY N/A 09/09/2018   Procedure: LEFT HEART CATH AND CORONARY ANGIOGRAPHY;  Surgeon: Alwyn Pea, MD;  Location: [facility] INVASIVE CV LAB;  Service: Cardiovascular;  Laterality: N/A;   LEFT HEART CATH AND CORONARY ANGIOGRAPHY Right 09/13/2023   Procedure: LEFT HEART CATH AND CORONARY ANGIOGRAPHY with coronary intervention;  Surgeon: Laurier Nancy, MD;  Location: [facility] INVASIVE CV LAB;  Service: Cardiovascular;  Laterality: Right;   LOWER EXTREMITY ANGIOGRAPHY Right 02/14/2021   Procedure: Lower Extremity Angiography;  Surgeon: Renford Dills, MD;  Location: [facility] INVAS[facility] CV LAB;  Service: Cardiovascular;  Laterality: Right;   none     STENT PLACEMENT VASCULAR ([facility] HX)       Social History:   reports that he has been smoking cigarettes. He has never used smokeless tobacco. He reports current alcohol use of about 1.0 standard drink of alcohol per week. He reports current drug use.   Family History:  His family history includes CAD in his brother; Cancer in his mother.   Allergies Allergies  Allergen Reactions   Penicillins Other (See Comments)    Patient unsure of allergy     Home Medications  Prior to Admission medications   Medication Sig Start Date End Date Taking? Authorizing Provider  aspirin EC 81 MG tablet Take 1 tablet (81 mg total) by mouth daily. Swallow whole. 09/15/23   Gillis Santa, MD  atorvastatin (LIPITOR) 80 MG tablet Take 1 tablet (80 mg total) by mouth daily. 09/14/23 09/13/24  Gillis Santa, MD  carvedilol (COREG) 6.25 MG tablet Take 1 tablet (6.25 mg total) by mouth 2 (two) times daily with a meal. 09/14/23 09/13/24  Gillis Santa, MD  clopidogrel (PLAVIX) 75 MG tablet Take 1 tablet (75 mg total) by mouth daily with breakfast. 09/15/23 09/14/24  Gillis Santa, MD  dapagliflozin propanediol (FARXIGA) 10 MG TABS tablet Take 1 tablet (10 mg total) by mouth daily.  09/15/23 09/14/24  Gillis Santa, MD  furosemide (LASIX) 20 MG tablet Take 1 tablet (20 mg total) by mouth daily as needed (for weight gain greater than 3 pounds in 1 day and  greater than 5 pounds in 2 days). 09/14/23 09/13/24  Gillis Santa, MD  hydrocortisone (ANUSOL-HC) 25 MG suppository Insert 1 suppository (25 mg total) rectally 2 (two) times daily as needed for hemorrhoids or anal itching. 09/14/23 2023/10/18  Gillis Santa, MD  insulin glargine (LANTUS) 100 UNIT/ML Solostar Pen Inject 40 Units into the skin daily. 09/14/23   Gillis Santa, MD  Insulin Pen Needle (PEN NEEDLES 31GX5/16") 31G X 8 MM MISC 1 Needle by Does not apply route daily. 09/14/23   Gillis Santa, MD  losartan (COZAAR) 25 MG tablet Take 1 tablet (25 mg total) by mouth daily. 09/14/23 09/13/24  Gillis Santa, MD  metFORMIN (GLUCOPHAGE) 500 MG tablet Take 1 tablet (500 mg total) by mouth daily with breakfast. 09/14/23 09/13/24  Gillis Santa, MD  polyethylene glycol (MIRALAX / GLYCOLAX) 17 g packet Mix with water and take 17 grams (1 capful) by mouth daily. 09/15/23   Gillis Santa, MD  spironolactone (ALDACTONE) 25 MG tablet Take 1 tablet (25 mg total) by mouth daily. 09/14/23 09/13/24  Gillis Santa, MD     Critical care time: 59 minutes    Raechel Chute, MD [facility] 10/18/23 1:18 PM

## 2023-10-15 NOTE — Significant Event (Addendum)
1214 Code Blue overhead announced.  1213 pt noted to be in pulseless Vtach. CPR initiated.  1214: Defibrillated at 200J. Wide complex tachycardia. Reported palpable pulse 1215: I mg epi given, followed with saline flush. Airway maintained by RT. Pt on vent. Pt had been intubated prior to code.  1218: Zada Girt APRN ordered epi down to 57mcg/kg/min, and levo down to 8. Changes made by bedside RN. 1217 event ended as pt maintained pulse. Two family members present. MD spoke with family about prognosis and goals of care. Family is bedside.  1220:Informed by MD family had made pt a DNR,DNI.  1221: HB notified by pt RN.

## 2023-10-15 NOTE — ED Notes (Signed)
Per MD Degali, move Epinephrine infusion to 31mcg/min

## 2023-10-15 NOTE — ED Notes (Signed)
MD intubating at this time

## 2023-10-15 NOTE — ED Provider Notes (Signed)
[facility] Provider Note    Event Date/Time   First MD Initiated Contact with Patient  [phone]     (approximate)   History   Chief Complaint: Cardiac Arrest   HPI  Tymir Shamar Burson is a 52 y.o. male with a history of CAD, hypertension, diabetes, cocaine abuse who is brought to the ED due to collapse at home.  EMS report initial cardiac arrest, PEA.  They obtained ROSC after about 5 minutes of CPR, and then during transport he had cardiac arrest again, continued CPR until arrival in the ED.  had PCI x 2 09/13/23. has h/o cocaine use. [birth year]. had been reporting CP x 2 days with family.        Physical Exam   Triage Vital Signs: ED Triage Vitals  Encounter Vitals Group     BP  0811 (!) 72/52     Systolic BP Percentile --      Diastolic BP Percentile --      Pulse Rate  0802 95     Resp  0815 19     Temp  0831 (!) 94.4 F (34.7 C)     Temp src --      SpO2  0828 97 %     Weight --      Height --      Head Circumference --      Peak Flow --      Pain Score --      Pain Loc --      Pain Education --      Exclude from Growth Chart --     Most recent vital signs: Vitals:    0930  0945  BP: 116/85 110/69  Pulse: 85 82  Resp: (!) 22 (!) 22  Temp: (!) 92.9 F (33.8 C) (!) 92.9 F (33.8 C)  SpO2: 91% 91%    General: Comatose.  GCS 3 CV:  CPR, Lucas device in place.  Good peripheral pulses with compressions. Resp:  Apneic.  King airway in place by EMS.  Bagging easily.  Clear breath sounds bilaterally. Abd:  No distention.  Other:  No signs of trauma.   ED Results / Procedures / Treatments   Labs (all labs ordered are listed, but only abnormal results are displayed) Labs Reviewed  BASIC METABOLIC PANEL - Abnormal; Notable for the following components:      Result Value   Sodium 151 (*)    Glucose, Bld 241 (*)    Creatinine, Ser 1.73 (*)    GFR,  Estimated 47 (*)    Anion gap 16 (*)    All other components within normal limits  CBC - Abnormal; Notable for the following components:   RBC 3.76 (*)    Hemoglobin 11.3 (*)    HCT 36.7 (*)    nRBC 0.4 (*)    All other components within normal limits  URINE DRUG SCREEN, QUALITATIVE ([facility] ONLY) - Abnormal; Notable for the following components:   Cocaine Metabolite,Ur [place] POSITIVE (*)    Cannabinoid 50 Ng, Ur [place] POSITIVE (*)    All other components within normal limits  URINALYSIS, W/ REFLEX TO CULTURE (INFECTION SUSPECTED) - Abnormal; Notable for the following components:   Color, Urine YELLOW (*)    APPearance HAZY (*)    Glucose, UA >=500 (*)    All other components within normal limits  SALICYLATE LEVEL - Abnormal; Notable for the following components:   Salicylate Lvl <7.0 (*)  All other components within normal limits  ACETAMINOPHEN LEVEL - Abnormal; Notable for the following components:   Acetaminophen (Tylenol), Serum <10 (*)    All other components within normal limits  BLOOD GAS, ARTERIAL - Abnormal; Notable for the following components:   pH, Arterial 7.05 (*)    pCO2 arterial 80 (*)    pO2, Arterial 35 (*)    Acid-base deficit 9.6 (*)    All other components within normal limits  CBG MONITORING, ED - Abnormal; Notable for the following components:   Glucose-Capillary 193 (*)    All other components within normal limits  TROPONIN I (HIGH SENSITIVITY) - Abnormal; Notable for the following components:   Troponin I (High Sensitivity) 39 (*)    All other components within normal limits  TROPONIN I (HIGH SENSITIVITY)     EKG Interpreted by me First EKG after ROSC at 8:03 AM Sinus rhythm, rate of 90.  Right axis, no acute ischemic changes  Repeat EKG at 8:16 AM Sinus bradycardia, type II Mobitz AV block, ventricular rate of 31. No ischemic changes  Repeat EKG at 8:32 am Sinus tachycardia, rate 122. No ischemic changes   RADIOLOGY Chest xray interpreted by me,  shows clear lungs. ETT in good position. OGT needs to be advanced. Stomach is distended   PROCEDURES:  .Critical Care  Performed by: Sharman Cheek, MD Authorized by: Sharman Cheek, MD   Critical care provider statement:    Critical care time (minutes):  45   Critical care time was exclusive of:  Separately billable procedures and treating other patients   Critical care was necessary to treat or prevent imminent or life-threatening deterioration of the following conditions:  CNS failure or compromise, cardiac failure, circulatory failure, shock, toxidrome and metabolic crisis   Critical care was time spent personally by me on the following activities:  Development of treatment plan with patient or surrogate, discussions with consultants, evaluation of patient's response to treatment, examination of patient, obtaining history from patient or surrogate, ordering and performing treatments and interventions, ordering and review of laboratory studies, ordering and review of radiographic studies, pulse oximetry, re-evaluation of patient's condition, review of old charts and ventilator management   Care discussed with: admitting provider   Comments:        Procedure Name: Intubation Date/Time:  10:24 AM  Performed by: Sharman Cheek, MDPre-anesthesia Checklist: Patient identified, Emergency Drugs available, Suction available and Patient being monitored Oxygen Delivery Method: Ambu bag Preoxygenation: Pre-oxygenation with 100% oxygen Ventilation: Mask ventilation without difficulty Laryngoscope Size: Glidescope and 4 Grade View: Grade I Tube size: 7.5 mm Number of attempts: 1 Airway Equipment and Method: Video-laryngoscopy Placement Confirmation: ETT inserted through vocal cords under direct vision, CO2 detector and Breath sounds checked- equal and bilateral Secured at: 26 cm Tube secured with: ETT holder Dental Injury: Teeth and Oropharynx as per pre-operative  assessment  Comments: Comatose, GCS 3. No sedatives or paralytics used.      CPR  Date/Time:  10:25 AM  Performed by: Sharman Cheek, MD Authorized by: Sharman Cheek, MD  CPR Procedure Details:      Amount of time prior to administration of ACLS/BLS (minutes):  5   ACLS/BLS initiated by EMS: Yes     CPR/ACLS performed in the ED: Yes     Duration of CPR (minutes):  40   Outcome: ROSC obtained    CPR performed via ACLS guidelines under my direct supervision.  See RN documentation for details including defibrillator use, medications, doses and timing.  MEDICATIONS ORDERED IN ED: Medications  norepinephrine (LEVOPHED) 4mg  in (0.016 mg/mL) premix infusion (10 mcg/min Intravenous New Bag/Given  0829)  sodium bicarbonate 1 mEq/mL injection (  Not Given  0934)  EPINEPHrine (ADRENALIN) 1 MG/10ML injection (1 mg Intravenous Given  0848)  EPINEPHrine (ADRENALIN) 5 mg in NS 250 mL (0.02 mg/mL) premix infusion (5 mcg/min Intravenous Rate/Dose Change  0932)  docusate sodium (COLACE) capsule 100 mg (has no administration in time range)  polyethylene glycol (MIRALAX / GLYCOLAX) packet 17 g (has no administration in time range)  heparin injection 5,000 Units (has no administration in time range)  insulin aspart (novoLOG) injection 1-3 Units (has no administration in time range)  Ampicillin-Sulbactam (UNASYN) 3 g in sodium chloride 0.9 % 100 mL IVPB (has no administration in time range)  EPINEPHrine (ADRENALIN) 1 MG/10ML injection (1 mg Intravenous Given  0758)  calcium chloride injection (1 g Intravenous Given  0800)  sodium bicarbonate injection (50 mEq Intravenous Given  0800)  0.9 %  sodium chloride infusion (0 mLs Intravenous Stopped  0836)  sodium bicarbonate injection (50 mEq Intravenous Given  0827)  calcium chloride injection (1 g Intravenous Given  0803)  atropine 1 MG/10ML  injection (1 mg Intravenous Given  0813)  sodium chloride 0.9 % bolus 1,000 mL (0 mLs Intravenous Stopped  0836)  EPINEPHrine (ADRENALIN) 1 MG/10ML injection (1 mg Intravenous Given  0825)     IMPRESSION / MDM / ASSESSMENT AND PLAN / ED COURSE  I reviewed the triage vital signs and the nursing notes.  DDx: AMI, drug overdose, electrolyte abnormality, AKI, acute heart failure  Patient's presentation is most consistent with acute presentation with potential threat to life or bodily function.  good morning! this pt is intubated and on levophed. arrived with CPR in progress. had witnessed arrest at home 7:10am today, had ROSC with EMS, then arrested again at 7:25, got ROSC here around 8:00, arrest at 8:25, ROSC at 8:30. EKG neg. for STEMI, Dr. Herbie Baltimore signed off for now (reg. cardiologist is Milta Deiters).   Pt had persistent shock with gradually worsening bradycardia and hypotension, resulted in repeated cardiac arrest. Start levophed + epinephrine drips.   Clinical Course as of  1025  Thu Oct 14, 2023  0919 ICU team at bedside, guiding further management [PS]    Clinical Course User Index [PS] Sharman Cheek, MD     FINAL CLINICAL IMPRESSION(S) / ED DIAGNOSES   Final diagnoses:  Cardiac arrest [facility])  Polysubstance abuse (HCC)     Rx / DC Orders   ED Discharge Orders     None        Note:  This document was prepared using Dragon voice recognition software and may include unintentional dictation errors.   Sharman Cheek, MD  1025

## 2023-10-15 NOTE — ED Notes (Signed)
ICU MD at bedside. Verbal order to decrease epinephrine to 84mcg/min

## 2023-10-15 NOTE — Progress Notes (Signed)
Pharmacy Antibiotic Note  Joshua [name] is a 51 y.o. male w/ PMH of CAD admitted on  with asp PNA.  Pharmacy has been consulted for Unasyn dosing.  Plan: start Unasyn 3 grams IV every 6 hours ---follow renal function for needed dose adjustments  Height: 5\' 10"  (177.8 cm) Weight: 82.5 kg (181 lb 14.1 oz) IBW/kg (Calculated) : 73  Temp (24hrs), Avg:93.4 F (34.1 C), Min:92.9 F (33.8 C), Max:94.4 F (34.7 C)  Recent Labs  Lab  0809  WBC 8.3  CREATININE 1.73*    Estimated Creatinine Clearance: 51.6 mL/min (A) (by C-G formula based on SCr of 1.73 mg/dL (H)).    Allergies  Allergen Reactions   Penicillins Other (See Comments)    Patient unsure of allergy    Antimicrobials this admission: 10/31 Unasyn >>   Microbiology results: none currently  Thank you for allowing pharmacy to be a part of this patient's care.  Lowella Bandy  10:05 AM

## 2023-10-15 NOTE — Progress Notes (Signed)
PHARMACY CONSULT NOTE - FOLLOW UP  Pharmacy Consult for Electrolyte Monitoring and Replacement   Recent Labs: Potassium (mmol/L)  Date Value   4.9  09/21/2014 4.1   Magnesium (mg/dL)  Date Value  16/09/9603 2.0   Calcium (mg/dL)  Date Value  54/08/8118 9.6   Calcium, Total (mg/dL)  Date Value  14/78/2956 9.2   Albumin (g/dL)  Date Value  21/30/8657 4.1  09/21/2014 3.8   Phosphorus (mg/dL)  Date Value  84/69/6295 5.1 (H)   Sodium (mmol/L)  Date Value   151 (H)  09/21/2014 138     Assessment: 52 year old male with a past medical history of CAD presenting to the hospital following a cardiac arrest. Pharmacy is asked to follow and replace electrolytes  Goal of Therapy:  Electrolytes WNL  Plan:  ---no electrolyte replacement warranted for today ---recheck electrolytes in am  Lowella Bandy ,PharmD Clinical Pharmacist  10:02 AM

## 2023-10-15 NOTE — Progress Notes (Signed)
Increased RR on vent to 30 per NP based on VBG result

## 2023-10-15 NOTE — ED Notes (Signed)
Per MD stafford, start norepinephrine @ 7mcg/min

## 2023-10-15 NOTE — ED Notes (Signed)
Pulse check. Pulses back per md stafford.

## 2023-10-15 DEATH — deceased
